# Patient Record
Sex: Male | Born: 1950 | Race: White | Hispanic: No | Marital: Married | Smoking: Former smoker
Health system: Southern US, Community
[De-identification: ages and names within clinical notes are randomized; demographics above are authoritative.]

## PROBLEM LIST (undated history)

## (undated) DIAGNOSIS — C4492 Squamous cell carcinoma of skin, unspecified: Secondary | ICD-10-CM

## (undated) DIAGNOSIS — C801 Malignant (primary) neoplasm, unspecified: Secondary | ICD-10-CM

## (undated) DIAGNOSIS — F419 Anxiety disorder, unspecified: Secondary | ICD-10-CM

## (undated) DIAGNOSIS — E119 Type 2 diabetes mellitus without complications: Secondary | ICD-10-CM

## (undated) DIAGNOSIS — D132 Benign neoplasm of duodenum: Secondary | ICD-10-CM

## (undated) DIAGNOSIS — E785 Hyperlipidemia, unspecified: Secondary | ICD-10-CM

## (undated) DIAGNOSIS — I10 Essential (primary) hypertension: Secondary | ICD-10-CM

## (undated) DIAGNOSIS — F32A Depression, unspecified: Secondary | ICD-10-CM

## (undated) DIAGNOSIS — C4491 Basal cell carcinoma of skin, unspecified: Secondary | ICD-10-CM

## (undated) DIAGNOSIS — D151 Benign neoplasm of heart: Secondary | ICD-10-CM

## (undated) DIAGNOSIS — E1140 Type 2 diabetes mellitus with diabetic neuropathy, unspecified: Secondary | ICD-10-CM

## (undated) DIAGNOSIS — G709 Myoneural disorder, unspecified: Secondary | ICD-10-CM

## (undated) DIAGNOSIS — N3010 Interstitial cystitis (chronic) without hematuria: Secondary | ICD-10-CM

## (undated) DIAGNOSIS — K869 Disease of pancreas, unspecified: Secondary | ICD-10-CM

## (undated) DIAGNOSIS — H269 Unspecified cataract: Secondary | ICD-10-CM

## (undated) HISTORY — PX: HERNIA REPAIR: SHX51

## (undated) HISTORY — PX: SPINE SURGERY: SHX786

## (undated) HISTORY — DX: Interstitial cystitis (chronic) without hematuria: N30.10

## (undated) HISTORY — PX: TURP VAPORIZATION: SUR1397

## (undated) HISTORY — DX: Basal cell carcinoma of skin, unspecified: C44.91

## (undated) HISTORY — DX: Anxiety disorder, unspecified: F41.9

## (undated) HISTORY — DX: Type 2 diabetes mellitus with diabetic neuropathy, unspecified: E11.40

## (undated) HISTORY — DX: Malignant (primary) neoplasm, unspecified: C80.1

## (undated) HISTORY — DX: Unspecified cataract: H26.9

## (undated) HISTORY — PX: NECK SURGERY: SHX720

## (undated) HISTORY — DX: Squamous cell carcinoma of skin, unspecified: C44.92

## (undated) HISTORY — DX: Depression, unspecified: F32.A

## (undated) HISTORY — DX: Type 2 diabetes mellitus without complications: E11.9

## (undated) HISTORY — DX: Essential (primary) hypertension: I10

## (undated) HISTORY — DX: Myoneural disorder, unspecified: G70.9

## (undated) HISTORY — DX: Hyperlipidemia, unspecified: E78.5

## (undated) HISTORY — PX: TRANSURETHRAL RESECTION OF PROSTATE: SHX73

## (undated) HISTORY — PX: CATARACT EXTRACTION, BILATERAL: SHX1313

---

## 2004-04-14 ENCOUNTER — Encounter: Admission: RE | Admit: 2004-04-14 | Discharge: 2004-04-14 | Payer: Self-pay | Admitting: Orthopedic Surgery

## 2004-06-15 ENCOUNTER — Inpatient Hospital Stay (HOSPITAL_COMMUNITY): Admission: RE | Admit: 2004-06-15 | Discharge: 2004-06-18 | Payer: Self-pay | Admitting: Orthopedic Surgery

## 2008-10-17 LAB — BASIC METABOLIC PANEL: Glucose: 137 mg/dL

## 2008-10-17 LAB — LIPID PANEL
Cholesterol: 226 mg/dL — AB (ref 0–200)
HDL: 35 mg/dL (ref 35–70)

## 2009-01-09 LAB — HEMOGLOBIN A1C: Hgb A1c MFr Bld: 6.2 % — AB (ref 4.0–6.0)

## 2009-01-09 LAB — LIPID PANEL
Cholesterol: 153 mg/dL (ref 0–200)
LDL Cholesterol: 94 mg/dL

## 2010-06-10 LAB — LIPID PANEL
Cholesterol: 175 mg/dL (ref 0–200)
HDL: 37 mg/dL (ref 35–70)

## 2010-06-10 LAB — BASIC METABOLIC PANEL: Glucose: 388 mg/dL

## 2010-09-23 LAB — HEMOGLOBIN A1C: Hgb A1c MFr Bld: 5.7 % (ref 4.0–6.0)

## 2010-09-23 LAB — BASIC METABOLIC PANEL: Glucose: 147 mg/dL

## 2011-06-01 ENCOUNTER — Encounter: Payer: Self-pay | Admitting: Family Medicine

## 2011-06-02 ENCOUNTER — Ambulatory Visit: Payer: BC Managed Care – PPO | Admitting: Family Medicine

## 2011-06-02 ENCOUNTER — Encounter: Payer: Self-pay | Admitting: Family Medicine

## 2011-06-02 DIAGNOSIS — E119 Type 2 diabetes mellitus without complications: Secondary | ICD-10-CM | POA: Insufficient documentation

## 2011-06-02 DIAGNOSIS — I10 Essential (primary) hypertension: Secondary | ICD-10-CM

## 2011-06-02 DIAGNOSIS — H269 Unspecified cataract: Secondary | ICD-10-CM | POA: Insufficient documentation

## 2011-06-02 DIAGNOSIS — E1165 Type 2 diabetes mellitus with hyperglycemia: Secondary | ICD-10-CM | POA: Insufficient documentation

## 2011-06-02 DIAGNOSIS — Z0000 Encounter for general adult medical examination without abnormal findings: Secondary | ICD-10-CM

## 2011-06-02 DIAGNOSIS — E785 Hyperlipidemia, unspecified: Secondary | ICD-10-CM

## 2011-06-02 LAB — COMPREHENSIVE METABOLIC PANEL
ALT: 48 U/L (ref 0–53)
AST: 31 U/L (ref 0–37)
Albumin: 4.4 g/dL (ref 3.5–5.2)
Alkaline Phosphatase: 65 U/L (ref 39–117)
BUN: 22 mg/dL (ref 6–23)
CO2: 25 mEq/L (ref 19–32)
Calcium: 9.7 mg/dL (ref 8.4–10.5)
Chloride: 102 mEq/L (ref 96–112)
Creat: 1.05 mg/dL (ref 0.50–1.35)
Glucose, Bld: 154 mg/dL — ABNORMAL HIGH (ref 70–99)
Potassium: 4.4 mEq/L (ref 3.5–5.3)
Sodium: 139 mEq/L (ref 135–145)
Total Bilirubin: 0.3 mg/dL (ref 0.3–1.2)
Total Protein: 6.9 g/dL (ref 6.0–8.3)

## 2011-06-02 LAB — POCT URINALYSIS DIPSTICK
Bilirubin, UA: NEGATIVE
Glucose, UA: NEGATIVE
Ketones, UA: NEGATIVE
Leukocytes, UA: NEGATIVE
Nitrite, UA: NEGATIVE
Protein, UA: 30
Spec Grav, UA: 1.025
Urobilinogen, UA: 0.2
pH, UA: 5.5

## 2011-06-02 LAB — LIPID PANEL
Cholesterol: 173 mg/dL (ref 0–200)
HDL: 43 mg/dL (ref >39)
LDL Cholesterol: 102 mg/dL — ABNORMAL HIGH (ref 0–99)
Total CHOL/HDL Ratio: 4 Ratio
Triglycerides: 139 mg/dL (ref <150)
VLDL: 28 mg/dL (ref 0–40)

## 2011-06-02 LAB — POCT GLYCOSYLATED HEMOGLOBIN (HGB A1C): Hemoglobin A1C: 6.2

## 2011-06-02 LAB — POCT UA - MICROSCOPIC ONLY
Casts, Ur, LPF, POC: NEGATIVE
Crystals, Ur, HPF, POC: NEGATIVE
Yeast, UA: NEGATIVE

## 2011-06-02 NOTE — Progress Notes (Signed)
Andrew Tanner is here for a follow up on blood pressure meds and diabetes. He has been under great stress for two months since younger brother was diagnosed with kidney cancer.  His exercise program was thrown off and his blood sugars are running over 200.  He is a very spiritual man, and he is struggling to get his life back to a managable routine.  His wife is working with him to start a walking routine.  His daughter is in [place], [place] in a work study program, doing great things, so that is going well.  We caught up on personal things.  O:  HEENT stable.  Cataracts stable.  No neovascular changes Chest clear. Heart reg, no Murmurs Abdomen soft, no HSM or masses Skin no changes in seborrhea, feet clear, no edema, pulses normal  A:  Stable c-v;  Stress has affected dm P:  Restart Metformin 500 er qhs Refill the lisinopril 20-25 Continue statin Check labs Follow up 3 months Resume exercise Schedule colonoscopy

## 2011-08-25 ENCOUNTER — Ambulatory Visit (INDEPENDENT_AMBULATORY_CARE_PROVIDER_SITE_OTHER): Payer: BC Managed Care – PPO | Admitting: Family Medicine

## 2011-08-25 ENCOUNTER — Encounter: Payer: Self-pay | Admitting: Family Medicine

## 2011-08-25 VITALS — BP 137/83 | HR 89 | Temp 97.5°F | Resp 16 | Ht 71.0 in | Wt 219.8 lb

## 2011-08-25 DIAGNOSIS — M542 Cervicalgia: Secondary | ICD-10-CM

## 2011-08-25 DIAGNOSIS — R202 Paresthesia of skin: Secondary | ICD-10-CM

## 2011-08-25 DIAGNOSIS — R209 Unspecified disturbances of skin sensation: Secondary | ICD-10-CM

## 2011-08-25 NOTE — Progress Notes (Signed)
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  BS's are 115 typically.  He is in today for:  Left upper arm fasciculations, numbness into left dorsal middle finger and left anterior shin x 3 months. H/O C6 disc surgery posteriorly Dr. Cristela Felt  Objective: Patient in no distress whatsoever., And pleasant to talk with.  HEENT: Unremarkable  Neck: Full range of motion, supple, no adenopathy, no tenderness, midline scar prominent in the line cervical spine  Left upper extremity: No muscle wasting or significant  weakness, reflexes symmetrical in the biceps and triceps areas. No fasciculations noted.  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 The symptoms are not progressing rapidly, but in light of the past surgery I think followup MRI is indicated.  Plan: MRI of cervical spine without contrast

## 2011-08-31 ENCOUNTER — Ambulatory Visit
Admission: RE | Admit: 2011-08-31 | Discharge: 2011-08-31 | Disposition: A | Discharge status: Home / Self Care | Payer: BC Managed Care – PPO | Source: Ambulatory Visit | Attending: Family Medicine | Admitting: Family Medicine

## 2011-08-31 DIAGNOSIS — R202 Paresthesia of skin: Secondary | ICD-10-CM

## 2011-09-15 ENCOUNTER — Other Ambulatory Visit: Payer: Self-pay | Admitting: Family Medicine

## 2011-09-15 ENCOUNTER — Telehealth: Payer: Self-pay

## 2011-09-15 DIAGNOSIS — M5090 Cervical disc disorder, unspecified, unspecified cervical region: Secondary | ICD-10-CM

## 2011-09-15 DIAGNOSIS — M542 Cervicalgia: Secondary | ICD-10-CM

## 2011-09-15 NOTE — Telephone Encounter (Signed)
Dr. Elbert Ewings,  I do not see a neuro referral-- can you please order this on patient?

## 2011-09-15 NOTE — Telephone Encounter (Signed)
Dr Kinnie Scales stopped by [phone] to speak w/you.  He has had a MRI and is waiting on a referral to a neurologist.  He states that he has called here and left a messagae, but has not gotten any response.  You may contact him @ [phone]

## 2011-10-10 ENCOUNTER — Telehealth: Payer: Self-pay

## 2011-10-10 NOTE — Telephone Encounter (Signed)
LMOM for pt to CB to let us know if he has been able to see the specialist and give [place] status report for Dr L. (Dr L had asked me to check w/pt).

## 2011-10-12 ENCOUNTER — Telehealth: Payer: Self-pay

## 2011-10-12 NOTE — Telephone Encounter (Signed)
LMOM again for pt stating we were just checking on him for Dr L and asked him to [facility] if he has any ?s/concerns for Dr L and/or if he is having trouble getting in to see the specialist.

## 2011-10-12 NOTE — Telephone Encounter (Signed)
Dr. Milus Glazier  [place] was [place] of [place] country [place] today.  He can be reached at [phone]

## 2011-10-12 NOTE — Telephone Encounter (Signed)
Pt did see Dr. Danielle Dess before he left out the country and Dr. Danielle Dess is supposed to be faxing the report to [place]. Dr. Danielle Dess is talking about replacing some discs in his neck. Verbally gave Dr. Milus Glazier the update.

## 2012-01-09 ENCOUNTER — Encounter: Payer: Self-pay | Admitting: Family Medicine

## 2012-01-09 ENCOUNTER — Ambulatory Visit: Payer: BC Managed Care – PPO | Admitting: Family Medicine

## 2012-01-09 VITALS — BP 120/76 | HR 70 | Temp 98.2°F | Resp 16 | Ht 71.0 in | Wt 221.2 lb

## 2012-01-09 DIAGNOSIS — R1031 Right lower quadrant pain: Secondary | ICD-10-CM

## 2012-01-09 DIAGNOSIS — E119 Type 2 diabetes mellitus without complications: Secondary | ICD-10-CM

## 2012-01-09 DIAGNOSIS — I10 Essential (primary) hypertension: Secondary | ICD-10-CM

## 2012-01-09 LAB — POCT CBC
Granulocyte percent: 80.8 %G — AB (ref 37–80)
Hemoglobin: 15.9 g/dL (ref 14.1–18.1)
MCH, POC: 29.9 pg (ref 27–31.2)
MID (cbc): 0.5 (ref 0–0.9)
MPV: 9.5 fL (ref 0–99.8)
POC Granulocyte: 6.1 (ref 2–6.9)
POC MID %: 6.3 %M (ref 0–12)
Platelet Count, POC: 236 10*3/uL (ref 142–424)
RBC: 5.31 M/uL (ref 4.69–6.13)
WBC: 7.6 10*3/uL (ref 4.6–10.2)

## 2012-01-09 LAB — POCT URINALYSIS DIPSTICK
Bilirubin, UA: NEGATIVE
Blood, UA: NEGATIVE
Glucose, UA: 250
Spec Grav, UA: >=1.03
pH, UA: 5.5

## 2012-01-09 LAB — COMPREHENSIVE METABOLIC PANEL
ALT: 29 U/L (ref 0–53)
AST: 19 U/L (ref 0–37)
Calcium: 10.3 mg/dL (ref 8.4–10.5)
Chloride: 102 mEq/L (ref 96–112)
Creat: 1.08 mg/dL (ref 0.50–1.35)
Sodium: 138 mEq/L (ref 135–145)
Total Bilirubin: 0.3 mg/dL (ref 0.3–1.2)
Total Protein: 6.5 g/dL (ref 6.0–8.3)

## 2012-01-09 MED ORDER — OMEPRAZOLE 40 MG PO CPDR: 40.0000 mg | DELAYED_RELEASE_CAPSULE | Freq: Every day | ORAL | Status: DC

## 2012-01-09 MED ORDER — IBUPROFEN 800 MG PO TABS: 800.0000 mg | ORAL_TABLET | Freq: Three times a day (TID) | ORAL | Status: DC | PRN

## 2012-01-09 NOTE — Progress Notes (Signed)
Subjective:    Patient ID: Andrew Tanner, male    DOB: 01/27/1951, 61 y.o.   MRN: [id number] Chief Complaint  Patient presents with  . Abdominal Pain    pt c/o umbilical pain close to navel x 4 months on and off scheduled for neck surgery nov 22    HPI  Has been having RLQ abd pain - unknown if it is coming from abd muscles vs internal. Normal BM, normal voiding.  No identified triggers - often wakes with from sleep.  Sometimes will last for about 3-4 days - no exacerbating or relieving factors. Tried diet change with decreased carbonated drinks and increased milk.  Not connected to food or BM at all.  Maybe a lttle worse with sitting long term or using abdominal muscles.  Did have h/o hernia - L inguinal repaired in the 80s with mesh.  Occ uses tums for reflux unchanged.  No changes in medications or supplement. No n/v. Pain feels like " a torn muscle" - aching.   No past medical history on file. Current Outpatient Prescriptions on File Prior to Visit  Medication Sig Dispense Refill  . citalopram (CELEXA) 40 MG tablet Take 40 mg by mouth daily.      . clonazePAM (KLONOPIN) 0.5 MG tablet Take 0.5 mg by mouth 3 (three) times daily as needed.      . fish oil-omega-3 fatty acids 1000 MG capsule Take 2 g by mouth daily.      Marland Kitchen lisinopril-hydrochlorothiazide (PRINZIDE,ZESTORETIC) 20-25 MG per tablet Take 1 tablet by mouth daily.      . metFORMIN (GLUCOPHAGE) 500 MG tablet Take 500 mg by mouth 2 (two) times daily with a meal.      . Multiple Vitamin (MULTIVITAMIN) tablet Take 1 tablet by mouth daily.      . niacin 500 MG tablet Take 500 mg by mouth daily.      . simvastatin (ZOCOR) 40 MG tablet Take 40 mg by mouth every evening.       No current facility-administered medications on file prior to visit.   No Known Allergies   Review of Systems  Constitutional: Positive for appetite change. Negative for fever, chills, diaphoresis and activity change.  HENT: Positive for neck pain and neck  stiffness.   Respiratory: Negative for shortness of breath.   Cardiovascular: Negative for chest pain.  Gastrointestinal: Positive for abdominal pain. Negative for nausea, vomiting, diarrhea, constipation, blood in stool, abdominal distention, anal bleeding and rectal pain.  Genitourinary: Negative for dysuria, urgency, frequency, hematuria, flank pain, decreased urine volume, discharge, difficulty urinating, penile pain and testicular pain.  Musculoskeletal: Positive for myalgias. Negative for joint swelling and gait problem.  Hematological: Negative for adenopathy.  Psychiatric/Behavioral: Positive for sleep disturbance.      BP 120/76  Pulse 70  Temp(Src) 98.2 F (36.8 C) (Oral)  Resp 16  Ht 5\' 11"  (1.803 m)  Wt 221 lb 3.2 oz (100.336 kg)  BMI 30.86 kg/m2  SpO2 97% Objective:   Physical Exam  Constitutional: He appears well-developed and well-nourished. No distress.  HENT:  Head: Normocephalic and atraumatic.  Neck: Normal range of motion. Neck supple. No thyromegaly present.  Cardiovascular: Normal rate, regular rhythm and normal heart sounds.   Pulmonary/Chest: Effort normal and breath sounds normal. No respiratory distress.  Abdominal: Soft. Normal appearance and bowel sounds are normal. He exhibits no distension and no mass. There is no tenderness. There is no rebound, no guarding and no CVA tenderness. No hernia.  Genitourinary: Rectum  normal and prostate normal. Rectal exam shows no tenderness and anal tone normal. Guaiac negative stool.  Lymphadenopathy:    He has no cervical adenopathy.  Skin: He is not diaphoretic.      Results for orders placed in visit on 01/09/12  COMPREHENSIVE METABOLIC PANEL      Result Value Range   Sodium 138  135 - 145 mEq/L   Potassium 4.3  3.5 - 5.3 mEq/L   Chloride 102  96 - 112 mEq/L   CO2 27  19 - 32 mEq/L   Glucose, Bld 223 (*) 70 - 99 mg/dL   BUN 21  6 - 23 mg/dL   Creat 4.09  8.11 - 9.14 mg/dL   Total Bilirubin 0.3  0.3 - 1.2  mg/dL   Alkaline Phosphatase 60  39 - 117 U/L   AST 19  0 - 37 U/L   ALT 29  0 - 53 U/L   Total Protein 6.5  6.0 - 8.3 g/dL   Albumin 4.3  3.5 - 5.2 g/dL   Calcium 78.2  8.4 - 95.6 mg/dL  H. PYLORI ANTIBODY, IGG      Result Value Range   H Pylori IgG <0.40    POCT CBC      Result Value Range   WBC 7.6  4.6 - 10.2 K/uL   Lymph, poc 1.0  0.6 - 3.4   POC LYMPH PERCENT 12.9  10 - 50 %L   MID (cbc) 0.5  0 - 0.9   POC MID % 6.3  0 - 12 %M   POC Granulocyte 6.1  2 - 6.9   Granulocyte percent 80.8 (*) 37 - 80 %G   RBC 5.31  4.69 - 6.13 M/uL   Hemoglobin 15.9  14.1 - 18.1 g/dL   HCT, POC 21.3  08.6 - 53.7 %   MCV 93.6  80 - 97 fL   MCH, POC 29.9  27 - 31.2 pg   MCHC 32.0  31.8 - 35.4 g/dL   RDW, POC 57.8     Platelet Count, POC 236  142 - 424 K/uL   MPV 9.5  0 - 99.8 fL  POCT URINALYSIS DIPSTICK      Result Value Range   Color, UA yellow     Clarity, UA clear     Glucose, UA 250     Bilirubin, UA negative     Ketones, UA negative     Spec Grav, UA >=1.030     Blood, UA negative     pH, UA 5.5     Protein, UA negative     Urobilinogen, UA 0.2     Nitrite, UA negative     Leukocytes, UA Negative    POCT GLYCOSYLATED HEMOGLOBIN (HGB A1C)      Result Value Range   Hemoglobin A1C 6.7      Assessment & Plan:  Diabetes mellitus type II - Plan: POCT glycosylated hemoglobin (Hb A1C) - well controlled on metformin.  Hypertension  Abdominal pain, right lower quadrant - Plan: POCT CBC, Comprehensive metabolic panel, H. pylori antibody, IgG, POCT urinalysis dipstick, IFOBT POC (occult bld, rslt in office), IFOBT POC (occult bld, rslt in office) - unknown etiology - will treat with high dose nsaid for muscular type pain and cover for gastritis as this may irritate it.  Meds ordered this encounter  Medications  . ibuprofen (ADVIL,MOTRIN) 800 MG tablet    Sig: Take 1 tablet (800 mg total) by mouth every 8 (eight)  hours as needed for pain.    Dispense:  30 tablet    Refill:  0  .  omeprazole (PRILOSEC) 40 MG capsule    Sig: Take 1 capsule (40 mg total) by mouth daily.    Dispense:  30 capsule    Refill:  0

## 2012-02-24 ENCOUNTER — Ambulatory Visit: Payer: BC Managed Care – PPO | Admitting: Family Medicine

## 2012-10-18 ENCOUNTER — Telehealth: Payer: Self-pay

## 2012-10-18 MED ORDER — LISINOPRIL-HYDROCHLOROTHIAZIDE 20-25 MG PO TABS: 1.0000 | ORAL_TABLET | Freq: Every day | ORAL | Status: DC

## 2012-10-18 MED ORDER — METFORMIN HCL 500 MG PO TABS: 500.0000 mg | ORAL_TABLET | Freq: Two times a day (BID) | ORAL | Status: DC

## 2012-10-18 MED ORDER — SIMVASTATIN 40 MG PO TABS: 40.0000 mg | ORAL_TABLET | Freq: Every evening | ORAL | Status: DC

## 2012-10-18 NOTE — Telephone Encounter (Signed)
Pt called today and scheduled the first available appt with Dr. Elbert Ewings for August 14. He is about out of his meds and would like refills to carry him til this appt.  Metformin Lisinopril Simvastatin  [facility] on [address]  Pt [phone]

## 2012-11-22 ENCOUNTER — Ambulatory Visit (INDEPENDENT_AMBULATORY_CARE_PROVIDER_SITE_OTHER): Payer: BC Managed Care – PPO | Admitting: Family Medicine

## 2012-11-22 ENCOUNTER — Encounter: Payer: Self-pay | Admitting: Family Medicine

## 2012-11-22 VITALS — BP 136/94 | HR 94 | Temp 98.3°F | Resp 16 | Ht 71.5 in | Wt 210.0 lb

## 2012-11-22 DIAGNOSIS — E119 Type 2 diabetes mellitus without complications: Secondary | ICD-10-CM

## 2012-11-22 DIAGNOSIS — H539 Unspecified visual disturbance: Secondary | ICD-10-CM

## 2012-11-22 DIAGNOSIS — Z23 Encounter for immunization: Secondary | ICD-10-CM

## 2012-11-22 MED ORDER — LISINOPRIL-HYDROCHLOROTHIAZIDE 20-25 MG PO TABS: 1.0000 | ORAL_TABLET | Freq: Every day | ORAL | Status: DC

## 2012-11-22 MED ORDER — METFORMIN HCL 500 MG PO TABS: 500.0000 mg | ORAL_TABLET | Freq: Two times a day (BID) | ORAL | Status: DC

## 2012-11-22 MED ORDER — SIMVASTATIN 40 MG PO TABS: 40.0000 mg | ORAL_TABLET | Freq: Every evening | ORAL | Status: DC

## 2012-11-22 NOTE — Progress Notes (Signed)
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 He recently took a trip to [place] and friends which was a great prep. Had some changes in his diet while traveling which were reflected by elevations in blood sugar up to 160. Andrew Tanner's under 150 though. Would like to defer the A1c test until he is back on his usual routine.  He had neck surgery which was very successful by Dr. Barnett Abu. He still recovering the pain is gone.  He does fine work and has noticed that he's having to use a variety of lenses to really get the kind of quality and his work that he needs. Like to see someone about improving his vision.  He's overdue for a tetanus which she received today.  Objective: No acute distress Fundi appear normal, there is a small cataract in each eye. Chest: Clear Heart: Regular no murmur Abdomen: Soft nontender without HSM Extremities: Good pulses and no edema  Assessment: Really doing quite well although the patient has change. We decided to do for the hemoglobin A1c until next visit in 3 months.  Plan:Need for Tdap vaccination - Plan: Tdap vaccine greater than or equal to 7yo IM  Visual changes - Plan: Ambulatory referral to Ophthalmology  Signed, Elvina Sidle, MD

## 2013-02-21 ENCOUNTER — Ambulatory Visit (INDEPENDENT_AMBULATORY_CARE_PROVIDER_SITE_OTHER): Payer: BC Managed Care – PPO | Admitting: Family Medicine

## 2013-02-21 ENCOUNTER — Encounter: Payer: Self-pay | Admitting: Family Medicine

## 2013-02-21 VITALS — BP 130/88 | HR 83 | Temp 98.3°F | Resp 16 | Ht 72.0 in | Wt 211.6 lb

## 2013-02-21 DIAGNOSIS — I10 Essential (primary) hypertension: Secondary | ICD-10-CM

## 2013-02-21 DIAGNOSIS — E119 Type 2 diabetes mellitus without complications: Secondary | ICD-10-CM

## 2013-02-21 DIAGNOSIS — Z23 Encounter for immunization: Secondary | ICD-10-CM

## 2013-02-21 DIAGNOSIS — E785 Hyperlipidemia, unspecified: Secondary | ICD-10-CM

## 2013-02-21 LAB — LIPID PANEL
Cholesterol: 295 mg/dL — ABNORMAL HIGH (ref 0–200)
HDL: 38 mg/dL — ABNORMAL LOW (ref >39)
LDL Cholesterol: 195 mg/dL — ABNORMAL HIGH (ref 0–99)
Total CHOL/HDL Ratio: 7.8 Ratio
Triglycerides: 312 mg/dL — ABNORMAL HIGH (ref <150)
VLDL: 62 mg/dL — ABNORMAL HIGH (ref 0–40)

## 2013-02-21 LAB — COMPREHENSIVE METABOLIC PANEL
ALT: 33 U/L (ref 0–53)
AST: 23 U/L (ref 0–37)
Albumin: 4.1 g/dL (ref 3.5–5.2)
Alkaline Phosphatase: 84 U/L (ref 39–117)
BUN: 16 mg/dL (ref 6–23)
CO2: 23 mEq/L (ref 19–32)
Calcium: 9.6 mg/dL (ref 8.4–10.5)
Chloride: 101 mEq/L (ref 96–112)
Creat: 0.94 mg/dL (ref 0.50–1.35)
Glucose, Bld: 210 mg/dL — ABNORMAL HIGH (ref 70–99)
Potassium: 4.2 mEq/L (ref 3.5–5.3)
Sodium: 134 mEq/L — ABNORMAL LOW (ref 135–145)
Total Bilirubin: 0.4 mg/dL (ref 0.3–1.2)
Total Protein: 6.6 g/dL (ref 6.0–8.3)

## 2013-02-21 LAB — POCT GLYCOSYLATED HEMOGLOBIN (HGB A1C): Hemoglobin A1C: 7.9

## 2013-02-21 MED ORDER — METFORMIN HCL 500 MG PO TABS: 500.0000 mg | ORAL_TABLET | Freq: Two times a day (BID) | ORAL | Status: DC

## 2013-02-21 MED ORDER — SIMVASTATIN 40 MG PO TABS: 40.0000 mg | ORAL_TABLET | Freq: Every evening | ORAL | Status: DC

## 2013-02-21 MED ORDER — LISINOPRIL-HYDROCHLOROTHIAZIDE 20-25 MG PO TABS: 1.0000 | ORAL_TABLET | Freq: Every day | ORAL | Status: DC

## 2013-02-21 NOTE — Progress Notes (Signed)
Patient ID: Andrew Tanner MRN: [id number], DOB: July 04, 1950, 62 y.o. Date of Encounter: 02/21/2013, 8:48 AM  Primary Physician: Andrew Sidle, MD  Chief Complaint: Diabetes follow up  HPI: 63 y.o. year old male with history below presents for follow up of diabetes mellitus. Doing well. No issues or complaints. Taking medications daily without adverse effects. No polydipsia, polyphagia, polyuria, or nocturia.  Blood sugars at home: generally doesn't check it, but they have been better Diet consists of: high quality foods Exercising regularly. walking  Eye MD: dr. Walker Kehr surgery sched for Nov 22 Influenza vaccine:  today  Daughter Andrew Tanner had OCD relapse but is better now.  This initially caused a family crisis. [name] is now on low dose abilify in addition to the Celexa.  Neck is doing reasonably well (about 2/10 re: pain and paresthesias)  Past Medical History  Diagnosis Date  . Diabetes mellitus without complication   . Cataract   . Hyperlipidemia   . Hypertension      Home Meds: Prior to Admission medications   Medication Sig Start Date End Date Taking? Authorizing Provider  citalopram (CELEXA) 40 MG tablet Take 40 mg by mouth daily.   Yes Historical Provider, MD  clonazePAM (KLONOPIN) 0.5 MG tablet Take 0.5 mg by mouth 3 (three) times daily as needed.   Yes Historical Provider, MD  ibuprofen (ADVIL,MOTRIN) 800 MG tablet Take 1 tablet (800 mg total) by mouth every 8 (eight) hours as needed for pain. 01/09/12  Yes Sherren Mocha, MD  lisinopril-hydrochlorothiazide (PRINZIDE,ZESTORETIC) 20-25 MG per tablet Take 1 tablet by mouth daily. 02/21/13  Yes Andrew Sidle, MD  metFORMIN (GLUCOPHAGE) 500 MG tablet Take 1 tablet (500 mg total) by mouth 2 (two) times daily with a meal. 02/21/13  Yes Andrew Sidle, MD  simvastatin (ZOCOR) 40 MG tablet Take 1 tablet (40 mg total) by mouth every evening. 02/21/13  Yes Andrew Sidle, MD  fish oil-omega-3 fatty acids 1000 MG  capsule Take 2 g by mouth daily.    Historical Provider, MD  Multiple Vitamin (MULTIVITAMIN) tablet Take 1 tablet by mouth daily.    Historical Provider, MD  niacin 500 MG tablet Take 500 mg by mouth daily.    Historical Provider, MD  omeprazole (PRILOSEC) 40 MG capsule Take 1 capsule (40 mg total) by mouth daily. 01/09/12   Sherren Mocha, MD    Allergies: No Known Allergies  History   Social History  . Marital Status: Married    Spouse Name: N/A    Number of Children: N/A  . Years of Education: N/A   Occupational History  . Not on file.   Social History Main Topics  . Smoking status: Former Games developer  . Smokeless tobacco: Not on file     Comment: quit 28 yrs ago  . Alcohol Use: Yes     Comment: 1 glass wine per week  . Drug Use: No  . Sexual Activity: Not on file   Other Topics Concern  . Not on file   Social History Narrative  . No narrative on file     Review of Systems: Constitutional: negative for chills, fever, night sweats, weight changes, or fatigue  HEENT: negative for vision changes, hearing loss, congestion, rhinorrhea, or epistaxis Cardiovascular: negative for chest pain, palpitations, diaphoresis, DOE, orthopnea, or edema Respiratory: negative for hemoptysis, wheezing, shortness of breath, dyspnea, or cough Abdominal: negative for abdominal pain, nausea, vomiting, diarrhea, or constipation Dermatological: negative for rash, erythema, or wounds Neurologic: negative for headache, dizziness, or  syncope Renal:  Negative for polyuria, polydipsia, or dysuria All other systems reviewed and are otherwise negative with the exception to those above and in the HPI.   Physical Exam: Blood pressure 130/88, pulse 83, temperature 98.3 F (36.8 C), temperature source Oral, resp. rate 16, height 6' (1.829 m), weight 211 lb 9.6 oz (95.981 kg), SpO2 98.00%., Body mass index is 28.69 kg/(m^2). General: Well developed, well nourished, in no acute distress. Head: Normocephalic,  atraumatic, eyes without discharge, sclera non-icteric, nares are without discharge. Bilateral auditory canals clear, TM's are without perforation, pearly grey and translucent with reflective cone of light bilaterally. Oral cavity moist, posterior pharynx without exudate, erythema, peritonsillar abscess, or post nasal drip.  Neck: Supple. No thyromegaly. Full ROM. No lymphadenopathy. Lungs: Clear bilaterally to auscultation without wheezes, rales, or rhonchi. Breathing is unlabored. Heart: RRR with S1 S2. No murmurs, rubs, or gallops appreciated. Abdomen: Soft, non-tender, non-distended with normoactive bowel sounds. No hepatosplenomegaly. No rebound/guarding. No obvious abdominal masses.  Diastasis recti Msk:  Strength and tone normal for age. Extremities/Skin: Warm and dry. No clubbing or cyanosis. No edema. No rashes, wounds, or suspicious lesions. Monofilament exam negative.  Neuro: Alert and oriented X 3. Moves all extremities spontaneously. Gait is normal. CNII-XII grossly in tact. Psych:  Responds to questions appropriately with a normal affect.   Labs:   ASSESSMENT AND PLAN:  62 y.o. year old male with hypertension and diabetes: Need for prophylactic vaccination and inoculation against influenza - Plan: Flu Vaccine QUAD 36+ mos IM  Type II or unspecified type diabetes mellitus without mention of complication, not stated as uncontrolled - Plan: HM Diabetes Foot Exam, metFORMIN (GLUCOPHAGE) 500 MG tablet, Comprehensive metabolic panel, POCT glycosylated hemoglobin (Hb A1C)  Hyperlipidemia - Plan: simvastatin (ZOCOR) 40 MG tablet, Lipid panel  Hypertension - Plan: lisinopril-hydrochlorothiazide (PRINZIDE,ZESTORETIC) 20-25 MG per tablet  Neck pain stable -  Signed, Andrew Sidle, MD 02/21/2013 8:48 AM

## 2013-12-10 ENCOUNTER — Encounter: Payer: Self-pay | Admitting: Family Medicine

## 2014-03-10 ENCOUNTER — Ambulatory Visit (INDEPENDENT_AMBULATORY_CARE_PROVIDER_SITE_OTHER): Payer: BC Managed Care – PPO | Admitting: Family Medicine

## 2014-03-10 ENCOUNTER — Encounter: Payer: Self-pay | Admitting: Family Medicine

## 2014-03-10 VITALS — BP 130/86 | HR 89 | Temp 97.9°F | Resp 16 | Ht 72.0 in | Wt 197.0 lb

## 2014-03-10 DIAGNOSIS — E785 Hyperlipidemia, unspecified: Secondary | ICD-10-CM

## 2014-03-10 DIAGNOSIS — E119 Type 2 diabetes mellitus without complications: Secondary | ICD-10-CM

## 2014-03-10 DIAGNOSIS — Z23 Encounter for immunization: Secondary | ICD-10-CM

## 2014-03-10 LAB — HEMOGLOBIN A1C
Hgb A1c MFr Bld: 11.8 % — ABNORMAL HIGH (ref <5.7)
Mean Plasma Glucose: 292 mg/dL — ABNORMAL HIGH (ref <117)

## 2014-03-10 LAB — COMPLETE METABOLIC PANEL WITH GFR
ALT: 21 U/L (ref 0–53)
AST: 14 U/L (ref 0–37)
Albumin: 4.3 g/dL (ref 3.5–5.2)
Alkaline Phosphatase: 82 U/L (ref 39–117)
BUN: 17 mg/dL (ref 6–23)
CO2: 23 mEq/L (ref 19–32)
Calcium: 9.4 mg/dL (ref 8.4–10.5)
Chloride: 100 mEq/L (ref 96–112)
Creat: 0.88 mg/dL (ref 0.50–1.35)
GFR, Est African American: >89 mL/min
GFR, Est Non African American: >89 mL/min
Glucose, Bld: 380 mg/dL — ABNORMAL HIGH (ref 70–99)
Potassium: 4.3 mEq/L (ref 3.5–5.3)
Sodium: 135 mEq/L (ref 135–145)
Total Bilirubin: 0.5 mg/dL (ref 0.2–1.2)
Total Protein: 6.5 g/dL (ref 6.0–8.3)

## 2014-03-10 LAB — MICROALBUMIN, URINE: Microalb, Ur: 3.8 mg/dL — ABNORMAL HIGH (ref <2.0)

## 2014-03-10 LAB — LIPID PANEL
Cholesterol: 284 mg/dL — ABNORMAL HIGH (ref 0–200)
HDL: 35 mg/dL — ABNORMAL LOW (ref >39)
LDL Cholesterol: 180 mg/dL — ABNORMAL HIGH (ref 0–99)
Total CHOL/HDL Ratio: 8.1 Ratio
Triglycerides: 347 mg/dL — ABNORMAL HIGH (ref <150)
VLDL: 69 mg/dL — ABNORMAL HIGH (ref 0–40)

## 2014-03-10 LAB — POCT GLYCOSYLATED HEMOGLOBIN (HGB A1C): Hemoglobin A1C: 12.5

## 2014-03-10 NOTE — Progress Notes (Signed)
This chart was scribed for Robyn Haber, MD by Cathie Hoops, ED Scribe. The patient was seen in Room 27. The patient's care was started at 9:37 AM.   Patient ID: Andrew Tanner MRN: [id number], DOB: [birth year], 63 y.o. Date of Encounter: 03/10/2014, 9:37 AM  Primary Physician: Robyn Haber, MD  Chief Complaint: HTN follow-up/Diabetes follow-up  HPI: 63 y.o. year old male with history below presents for hypertension follow up and diabetes mellitus. Doing well. No issues or complaints. Taking medications daily without adverse effects. No polydipsia, polyphagia, polyuria, or nocturia. No CP, HA, visual changes, SOB or focal deficits.   Blood sugars at home: He notes he doesn't check his blood sugars regularly. He denies having any symptomatic lows.  Diet consists of: Eating 2 meals and a snack, portion control and he eats certain foods in moderation. He denies having a regular exercise routine. Last A1C:  7.9 on 02/21/2013  Eye MD: Dr. Katy Fitch, he had a eye scan one week ago with no sign of sugar in the eyes. Cataracts were removed bilaterally. His vision has improved. DDS: Appointment scheduled. Influenza vaccine: UTD Pneumococcal vaccine:  Zostavax: Denies having the shingles vaccine. Colonoscopy: Will have his colonoscopy in several months  Daughter Judson Roch works for an agency in [place] but is currently looking for new jobs in [place]. Pt notes he signed up for Medicare.    Past Medical History  Diagnosis Date  . Diabetes mellitus without complication   . Cataract   . Hyperlipidemia   . Hypertension      Home Meds: Prior to Admission medications   Medication Sig Start Date End Date Taking? Authorizing Provider  citalopram (CELEXA) 40 MG tablet Take 40 mg by mouth daily.   Yes Historical Provider, MD  clonazePAM (KLONOPIN) 0.5 MG tablet Take 0.5 mg by mouth 3 (three) times daily as needed.   Yes Historical Provider, MD  fish oil-omega-3 fatty acids 1000 MG  capsule Take 2 g by mouth daily.   Yes Historical Provider, MD  ibuprofen (ADVIL,MOTRIN) 800 MG tablet Take 1 tablet (800 mg total) by mouth every 8 (eight) hours as needed for pain. 01/09/12  Yes Shawnee Knapp, MD  lisinopril-hydrochlorothiazide (PRINZIDE,ZESTORETIC) 20-25 MG per tablet Take 1 tablet by mouth daily. 02/21/13  Yes Robyn Haber, MD  metFORMIN (GLUCOPHAGE) 500 MG tablet Take 1 tablet (500 mg total) by mouth 2 (two) times daily with a meal. 02/21/13  Yes Robyn Haber, MD  Multiple Vitamin (MULTIVITAMIN) tablet Take 1 tablet by mouth daily.   Yes Historical Provider, MD  niacin 500 MG tablet Take 500 mg by mouth daily.   Yes Historical Provider, MD  omeprazole (PRILOSEC) 40 MG capsule Take 1 capsule (40 mg total) by mouth daily. 01/09/12  Yes Shawnee Knapp, MD  simvastatin (ZOCOR) 40 MG tablet Take 1 tablet (40 mg total) by mouth every evening. 02/21/13  Yes Robyn Haber, MD    Allergies: No Known Allergies  History   Social History  . Marital Status: Married    Spouse Name: N/A    Number of Children: N/A  . Years of Education: N/A   Occupational History  . Not on file.   Social History Main Topics  . Smoking status: Former Research scientist (life sciences)  . Smokeless tobacco: Not on file     Comment: quit 28 yrs ago  . Alcohol Use: Yes     Comment: 1 glass wine per week  . Drug Use: No  . Sexual Activity: Not on file  Other Topics Concern  . Not on file   Social History Narrative     Family History  Problem Relation Age of Onset  . Heart disease Father   . Hyperlipidemia Father   . Diabetes Father     type 1  . Cancer Brother     kidney cancer  . Diabetes Brother   . Depression Brother   . Cancer Mother     lung cancer    Review of Systems: Constitutional: negative for chills, fever, night sweats, weight changes, or fatigue  HEENT: negative for vision changes, hearing loss, congestion, rhinorrhea, ST, epistaxis, or sinus pressure Cardiovascular: negative for chest pain,  palpitations, or DOE Respiratory: negative for hemoptysis, wheezing, shortness of breath, or cough Abdominal: negative for abdominal pain, nausea, vomiting, diarrhea, or constipation Dermatological: negative for rash Neurologic: negative for headache, dizziness, or syncope All other systems reviewed and are otherwise negative with the exception to those above and in the HPI.   Physical Exam: Blood pressure 130/86, pulse 89, temperature 97.9 F (36.6 C), temperature source Oral, resp. rate 16, height 6' (1.829 m), weight 197 lb (89.359 kg), SpO2 98 %., Body mass index is 26.71 kg/(m^2). BP Readings from Last 3 Encounters:  03/10/14 130/86  02/21/13 130/88  11/22/12 136/94   Wt Readings from Last 3 Encounters:  03/10/14 197 lb (89.359 kg)  02/21/13 211 lb 9.6 oz (95.981 kg)  11/22/12 210 lb (95.255 kg)   General: Well developed, well nourished, in no acute distress. Head: Normocephalic, atraumatic, eyes without discharge, sclera non-icteric, nares are without discharge. Bilateral auditory canals clear, TM's are without perforation, pearly grey and translucent with reflective cone of light bilaterally. Oral cavity moist, posterior pharynx without exudate, erythema, peritonsillar abscess, or post nasal drip.  Neck: Supple. No thyromegaly. Full ROM. No lymphadenopathy. No carotid bruits. Lungs: Clear bilaterally to auscultation without wheezes, rales, or rhonchi. Breathing is unlabored. Heart: RRR with S1 S2. No murmurs, rubs, or gallops appreciated.  Abdomen: Soft, non-tender, non-distended with normoactive bowel sounds. No hepatosplenomegaly. No rebound/guarding. No obvious abdominal masses. Msk:  Strength and tone normal for age. Extremities/Skin: Warm and dry. No clubbing or cyanosis. No edema. No rashes or suspicious lesions. Distal pulses 2+ and equal bilaterally.   He also has a new right inner calf lesion which is enlarging.  It is crusting and verrucous, measuring 5 mm.  He has  diffuse seborrhea on face and scalp. Neuro: Alert and oriented X 3. Moves all extremities spontaneously. Gait is normal. CNII-XII grossly in tact. DTR 2+, cerebellar function intact. Rhomberg normal. Psych:  Responds to questions appropriately with a normal affect.   Labs: Results for orders placed or performed in visit on 03/10/14  POCT glycosylated hemoglobin (Hb A1C)  Result Value Ref Range   Hemoglobin A1C 12.5   I am repeating the A1C as this value is so abnormal.  ASSESSMENT AND PLAN:  63 y.o. year old male with  Type 2 diabetes mellitus without complication - Plan: HM Diabetes Foot Exam, POCT glycosylated hemoglobin (Hb A1C), Microalbumin, urine, COMPLETE METABOLIC PANEL WITH GFR, Hemoglobin A1c, CANCELED: POCT glycosylated hemoglobin (Hb A1C)  Hyperlipidemia - Plan: POCT glycosylated hemoglobin (Hb A1C), Lipid panel, COMPLETE METABOLIC PANEL WITH GFR  Scheduled skin biopsy for tomorrow at 9 am.  Signed, Robyn Haber, MD 03/10/2014 9:37 AM

## 2014-03-11 ENCOUNTER — Ambulatory Visit (INDEPENDENT_AMBULATORY_CARE_PROVIDER_SITE_OTHER): Payer: BC Managed Care – PPO | Admitting: Family Medicine

## 2014-03-11 ENCOUNTER — Encounter: Payer: Self-pay | Admitting: Family Medicine

## 2014-03-11 VITALS — BP 120/78 | HR 96 | Temp 98.5°F | Resp 16 | Ht 71.0 in | Wt 196.0 lb

## 2014-03-11 DIAGNOSIS — E11630 Type 2 diabetes mellitus with periodontal disease: Secondary | ICD-10-CM

## 2014-03-11 DIAGNOSIS — E785 Hyperlipidemia, unspecified: Secondary | ICD-10-CM

## 2014-03-11 DIAGNOSIS — L989 Disorder of the skin and subcutaneous tissue, unspecified: Secondary | ICD-10-CM

## 2014-03-11 MED ORDER — CANAGLIFLOZIN-METFORMIN HCL 50-1000 MG PO TABS: 1.0000 | ORAL_TABLET | ORAL | Status: DC

## 2014-03-11 MED ORDER — ROSUVASTATIN CALCIUM 10 MG PO TABS: 10.0000 mg | ORAL_TABLET | Freq: Every day | ORAL | Status: DC

## 2014-03-11 NOTE — Progress Notes (Signed)
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  He also has type 2 diabetes which has gotten out of control  I spoke with wife Horris Latino and patient at length about getting the diabetes under control with the new medication, as well as controlling the lipids.  In addition, he needs dental attention.  All of these improvements will lead to prevention of heart and vascular disease.  I spent over 30 minutes face to face discussing the importance of getting diabetes, cholesterol, and dental care under control.  Objective:  NAD Right leg lesion of 5 mm is verrucous and elevated.  Area was prepped with betadine and anesthetized with 1% xylo with epi. An eliptical incision was used to remove the entire lesion with 3 mm margin. 4 interrupted 4-0 nylon sutures were used to close the lesion and a sterile dressing was applied.  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

## 2014-03-12 ENCOUNTER — Other Ambulatory Visit: Payer: Self-pay | Admitting: Family Medicine

## 2014-03-12 DIAGNOSIS — E11630 Type 2 diabetes mellitus with periodontal disease: Secondary | ICD-10-CM

## 2014-03-12 DIAGNOSIS — E785 Hyperlipidemia, unspecified: Secondary | ICD-10-CM

## 2014-03-12 MED ORDER — SIMVASTATIN 20 MG PO TABS: 20.0000 mg | ORAL_TABLET | Freq: Every day | ORAL | Status: DC

## 2014-03-12 MED ORDER — CANAGLIFLOZIN-METFORMIN HCL 50-1000 MG PO TABS: 1.0000 | ORAL_TABLET | ORAL | Status: DC

## 2014-03-20 ENCOUNTER — Encounter: Payer: Self-pay | Admitting: Family Medicine

## 2014-03-20 ENCOUNTER — Ambulatory Visit (INDEPENDENT_AMBULATORY_CARE_PROVIDER_SITE_OTHER): Payer: BC Managed Care – PPO | Admitting: Family Medicine

## 2014-03-20 VITALS — BP 151/93 | HR 95 | Temp 98.5°F | Resp 16 | Ht 71.5 in | Wt 196.4 lb

## 2014-03-20 DIAGNOSIS — L989 Disorder of the skin and subcutaneous tissue, unspecified: Secondary | ICD-10-CM

## 2014-03-20 NOTE — Progress Notes (Signed)
Subjective:    Patient ID: Andrew Tanner, male    DOB: 1950-05-21, 63 y.o.   MRN: [id number]  This chart was scribed for Robyn Haber, MD by Chester Holstein, ED Scribe. This patient was seen in room 26 and the patient's care was started at 2:36 PM.   HPI  HPI Comments: Andrew Tanner is a 63 y.o. male who presents to the [facility] for suture removal from his right leg.  Pt notes the area has some noticeable redness.   Pt is currently taking simvastatin.  Pt has only been taking his Invokamet for 6 days but has not noticed any changes in his blood sugar. Pt has an infected tooth in his mouth and he is planning on seeing an oral surgeon for the problem. Pt has been prescribe amoxicillin to start today.   Patient is a Corporate treasurer  Past Medical History  Diagnosis Date  . Diabetes mellitus without complication   . Cataract   . Hyperlipidemia   . Hypertension     Past Surgical History  Procedure Laterality Date  . Hernia repair    . Turp vaporization    . Spine surgery    . Transurethral resection of prostate      Family History  Problem Relation Age of Onset  . Heart disease Father   . Hyperlipidemia Father   . Diabetes Father     type 1  . Cancer Brother     kidney cancer  . Diabetes Brother   . Depression Brother   . Cancer Mother     lung cancer    History   Social History  . Marital Status: Married    Spouse Name: [name]    Number of Children: N/A  . Years of Education: N/A   Occupational History  . Not on file.   Social History Main Topics  . Smoking status: Former Research scientist (life sciences)  . Smokeless tobacco: Not on file     Comment: quit 28 yrs ago  . Alcohol Use: Yes     Comment: 1 glass wine per week  . Drug Use: No  . Sexual Activity: Not on file   Other Topics Concern  . Not on file   Social History Narrative    No Known Allergies     Review of Systems  HENT: Positive for dental problem.   Skin: Positive for  wound (from lesion removal, sutured, healing).       Objective:   Physical Exam  Constitutional: He is oriented to person, place, and time. He appears well-developed and well-nourished.  HENT:  Head: Normocephalic.  Eyes: Conjunctivae are normal.  Neck: Normal range of motion. Neck supple.  Pulmonary/Chest: Effort normal.  Musculoskeletal: Normal range of motion.  Neurological: He is alert and oriented to person, place, and time.  Skin: Skin is warm and dry.  Psychiatric: He has a normal mood and affect. His behavior is normal.  Nursing note and vitals reviewed.  Sutures removed from right medial popliteal area. The area does show some erythema. There is no purulent drainage.      BP 151/93 mmHg  Pulse 95  Temp(Src) 98.5 F (36.9 C) (Oral)  Resp 16  Ht 5' 11.5" (1.816 m)  Wt 196 lb 6.4 oz (89.086 kg)  BMI 27.01 kg/m2  SpO2 97%  Assessment & Plan:    Continue the simvastatin and invoka met   Dental work next week. Start the amoxicillin today. Call if area on leg  continues to look red or infected.  This chart was scribed in my presence and reviewed by me personally.    ICD-9-CM ICD-10-CM   1. Skin lesion of right lower extremity 709.9 L98.9      Signed, Robyn Haber, MD  Robyn Haber, M.D.

## 2014-03-21 ENCOUNTER — Other Ambulatory Visit: Payer: Self-pay | Admitting: Family Medicine

## 2014-03-21 DIAGNOSIS — L03116 Cellulitis of left lower limb: Secondary | ICD-10-CM

## 2014-03-21 MED ORDER — AMOXICILLIN-POT CLAVULANATE 875-125 MG PO TABS: 1.0000 | ORAL_TABLET | Freq: Two times a day (BID) | ORAL | Status: DC

## 2014-06-26 ENCOUNTER — Encounter: Payer: Self-pay | Admitting: Family Medicine

## 2014-06-26 ENCOUNTER — Ambulatory Visit (INDEPENDENT_AMBULATORY_CARE_PROVIDER_SITE_OTHER): Payer: Medicare Other | Admitting: Family Medicine

## 2014-06-26 VITALS — BP 134/83 | HR 84 | Temp 98.0°F | Resp 16 | Ht 71.5 in | Wt 197.8 lb

## 2014-06-26 DIAGNOSIS — E1165 Type 2 diabetes mellitus with hyperglycemia: Secondary | ICD-10-CM | POA: Diagnosis not present

## 2014-06-26 DIAGNOSIS — IMO0002 Reserved for concepts with insufficient information to code with codable children: Secondary | ICD-10-CM

## 2014-06-26 LAB — POCT GLYCOSYLATED HEMOGLOBIN (HGB A1C): Hemoglobin A1C: 6.4

## 2014-06-26 NOTE — Progress Notes (Signed)
Patient ID: Andrew Tanner, male   DOB: [birth year], 64 y.o.   MRN: [id number]  This chart was scribed for Robyn Haber, MD by Ladene Artist, ED Scribe. The patient was seen in room 25. Patient's care was started at 1:00 PM.  Patient ID: Andrew Tanner MRN: [id number], DOB: 1950/04/26, 64 y.o. Date of Encounter: 06/26/2014, 1:00 PM  Primary Physician: Robyn Haber, MD  Chief Complaint  Patient presents with  . Diabetes    3 month follow up  . Hypertension  . Hyperlipidemia    HPI: 64 y.o. year old male with history below presents for a DM follow-up.  Pt states that he feels well overall. He has been walking 10 miles per week. He has also eliminated fried foods, diet Coke and sugar from his diet. Pt denies visual disturbances and loss of sensation in his feet.  Preventative Maintenance  Pt has his eyes examined regularly. He had a prescription change in December 2015; no signs of glaucoma.  He had his infected tooth treated by the dentist and this worked out extremely well.  Pt's daughter has finished 2 months at her job as an Surveyor, minerals. Her job wants her to take classes to potentially serve as the Editor, commissioning.  Past Medical History  Diagnosis Date  . Diabetes mellitus without complication   . Cataract   . Hyperlipidemia   . Hypertension     Home Meds: Prior to Admission medications   Medication Sig Start Date End Date Taking? Authorizing Provider  amoxicillin-clavulanate (AUGMENTIN) 875-125 MG per tablet Take 1 tablet by mouth 2 (two) times daily. 03/21/14   Robyn Haber, MD  Canagliflozin-Metformin HCl (INVOKAMET) 50-1000 MG TABS Take 1 tablet by mouth 1 day or 1 dose. Please call me if the coupon I sent does not cover the once a day prescription for the next year. 03/12/14   Robyn Haber, MD  citalopram (CELEXA) 40 MG tablet Take 40 mg by mouth daily.    Historical Provider, MD  clonazePAM (KLONOPIN) 0.5 MG tablet Take 0.5 mg by mouth 3 (three) times daily  as needed.    Historical Provider, MD  fish oil-omega-3 fatty acids 1000 MG capsule Take 2 g by mouth daily.    Historical Provider, MD  ibuprofen (ADVIL,MOTRIN) 800 MG tablet Take 1 tablet (800 mg total) by mouth every 8 (eight) hours as needed for pain. 01/09/12   Shawnee Knapp, MD  lisinopril-hydrochlorothiazide (PRINZIDE,ZESTORETIC) 20-25 MG per tablet Take 1 tablet by mouth daily. 02/21/13   Robyn Haber, MD  Multiple Vitamin (MULTIVITAMIN) tablet Take 1 tablet by mouth daily.    Historical Provider, MD  omeprazole (PRILOSEC) 40 MG capsule Take 1 capsule (40 mg total) by mouth daily. 01/09/12   Shawnee Knapp, MD  rosuvastatin (CRESTOR) 10 MG tablet Take 1 tablet (10 mg total) by mouth daily. 03/11/14   Robyn Haber, MD  simvastatin (ZOCOR) 20 MG tablet Take 1 tablet (20 mg total) by mouth at bedtime. 03/12/14   Robyn Haber, MD    Allergies: No Known Allergies  History   Social History  . Marital Status: Married    Spouse Name: [name]  . Number of Children: N/A  . Years of Education: N/A   Occupational History  . Not on file.   Social History Main Topics  . Smoking status: Former Research scientist (life sciences)  . Smokeless tobacco: Not on file     Comment: quit 28 yrs ago  . Alcohol Use: Yes     Comment: 1 glass  wine per week  . Drug Use: No  . Sexual Activity: Not on file   Other Topics Concern  . Not on file   Social History Narrative    Review of Systems: Constitutional: negative for chills, fever, night sweats, weight changes, or fatigue  HEENT: negative for vision changes, hearing loss, congestion, rhinorrhea, ST, epistaxis, or sinus pressure Cardiovascular: negative for chest pain or palpitations Respiratory: negative for hemoptysis, wheezing, shortness of breath, or cough Abdominal: negative for abdominal pain, nausea, vomiting, diarrhea, or constipation Dermatological: negative for rash Neurologic: negative for headache, dizziness, or syncope All other systems reviewed and  are otherwise negative with the exception to those above and in the HPI.  Physical Exam: Blood pressure 134/83, pulse 84, temperature 98 F (36.7 C), temperature source Oral, resp. rate 16, height 5' 11.5" (1.816 m), weight 197 lb 12.8 oz (89.721 kg), SpO2 100 %., Body mass index is 27.21 kg/(m^2). General: Well developed, well nourished, in no acute distress. Head: Normocephalic, atraumatic, eyes without discharge, sclera non-icteric, nares are without discharge. Bilateral auditory canals clear, TM's are without perforation, pearly grey and translucent with reflective cone of light bilaterally. Oral cavity moist, posterior pharynx without exudate, erythema, peritonsillar abscess, or post nasal drip.  Neck: Supple. No thyromegaly. Full ROM. No lymphadenopathy. Lungs: Clear bilaterally to auscultation without wheezes, rales, or rhonchi. Breathing is unlabored. Heart: RRR with S1 S2. No murmurs, rubs, or gallops appreciated. Abdomen: Soft, non-tender, non-distended with normoactive bowel sounds. No hepatomegaly. No rebound/guarding. No obvious abdominal masses. Msk:  Strength and tone normal for age. Extremities/Skin: Warm and dry. No clubbing or cyanosis. No edema. No rashes or suspicious lesions.  Monofilament negative for sensory loss. Neuro: Alert and oriented X 3. Moves all extremities spontaneously. Gait is normal. CNII-XII grossly in tact. Psych:  Responds to questions appropriately with a normal affect.   Labs: Results for orders placed or performed in visit on 06/26/14  POCT glycosylated hemoglobin (Hb A1C)  Result Value Ref Range   Hemoglobin A1C 6.4     ASSESSMENT AND PLAN:  64 y.o. year old male with  This chart was scribed in my presence and reviewed by me personally.    ICD-9-CM ICD-10-CM   1. Diabetes type 2, uncontrolled 250.02 E11.65 HM Diabetes Foot Exam     POCT glycosylated hemoglobin (Hb A1C)     Signed, Robyn Haber, MD 06/26/2014 1:00 PM

## 2014-07-25 ENCOUNTER — Telehealth: Payer: Self-pay | Admitting: *Deleted

## 2014-07-25 NOTE — Telephone Encounter (Signed)
Patient is taking lisinopril

## 2014-07-25 NOTE — Telephone Encounter (Signed)
A NP called and stated the pt is not on any medications to protect his kidneys and pt believes he needs to be on some.  Please call the pt back or his NP Cassandra at [place]

## 2014-07-31 ENCOUNTER — Other Ambulatory Visit: Payer: Self-pay | Admitting: Family Medicine

## 2014-07-31 MED ORDER — METFORMIN HCL 500 MG PO TABS: 500.0000 mg | ORAL_TABLET | Freq: Two times a day (BID) | ORAL | Status: DC

## 2015-02-26 ENCOUNTER — Ambulatory Visit (INDEPENDENT_AMBULATORY_CARE_PROVIDER_SITE_OTHER): Payer: Medicare Other | Admitting: Family Medicine

## 2015-02-26 VITALS — BP 124/86 | HR 95 | Temp 98.1°F | Resp 18 | Ht 72.0 in | Wt 212.0 lb

## 2015-02-26 DIAGNOSIS — E785 Hyperlipidemia, unspecified: Secondary | ICD-10-CM

## 2015-02-26 DIAGNOSIS — M6580 Other synovitis and tenosynovitis, unspecified site: Secondary | ICD-10-CM

## 2015-02-26 DIAGNOSIS — M76891 Other specified enthesopathies of right lower limb, excluding foot: Secondary | ICD-10-CM

## 2015-02-26 MED ORDER — DICLOFENAC SODIUM 75 MG PO TBEC: 75.0000 mg | DELAYED_RELEASE_TABLET | Freq: Two times a day (BID) | ORAL | Status: DC

## 2015-02-26 MED ORDER — SIMVASTATIN 20 MG PO TABS: 20.0000 mg | ORAL_TABLET | Freq: Every day | ORAL | Status: DC

## 2015-02-26 MED ORDER — METFORMIN HCL 500 MG PO TABS: 500.0000 mg | ORAL_TABLET | Freq: Two times a day (BID) | ORAL | Status: DC

## 2015-02-26 NOTE — Progress Notes (Signed)
This chart was scribed for Andrew Haber, MD by [name], medical scribe at [facility].The patient was seen in exam room 12 and the patient's care was started at 2:34 PM.  Patient ID: Andrew Tanner MRN: [id number], DOB: 1950/05/12, 64 y.o. Date of Encounter: 02/26/2015  Primary Physician: Andrew Haber, MD  Chief Complaint:  Chief Complaint  Patient presents with  . Knee Pain    right knee, described as a stinging pain. Started about 3 weeks ago.     HPI:  Andrew Tanner is a 64 y.o. male who presents to [facility] complaining of right knee pain started about 3 weeks ago. He is walking 3 times a week, 3 miles a day. He was putting on his shoe when he lifts his right leg and felt stinging pain. He put his leg down and the pain subsided. He started walking and noticed the pain return. He tried ibuprofen and applied heat pad for mild relief.   He also requested medication refill.   Patient is a Hospital doctor  Past Medical History  Diagnosis Date  . Diabetes mellitus without complication ([place])   . Cataract   . Hyperlipidemia   . Hypertension      Home Meds: Prior to Admission medications   Medication Sig Start Date End Date Taking? Authorizing Provider  amoxicillin-clavulanate (AUGMENTIN) 875-125 MG per tablet Take 1 tablet by mouth 2 (two) times daily. 03/21/14   Andrew Haber, MD  Canagliflozin-Metformin HCl (INVOKAMET) 50-1000 MG TABS Take 1 tablet by mouth 1 day or 1 dose. Please call me if the coupon I sent does not cover the once a day prescription for the next year. 03/12/14   Andrew Haber, MD  citalopram (CELEXA) 40 MG tablet Take 40 mg by mouth daily.    Historical Provider, MD  clonazePAM (KLONOPIN) 0.5 MG tablet Take 0.5 mg by mouth 3 (three) times daily as needed.    Historical Provider, MD  fish oil-omega-3 fatty acids 1000 MG capsule Take 2 g by mouth daily.    Historical Provider, MD  ibuprofen  (ADVIL,MOTRIN) 800 MG tablet Take 1 tablet (800 mg total) by mouth every 8 (eight) hours as needed for pain. 01/09/12   Shawnee Knapp, MD  lisinopril-hydrochlorothiazide (PRINZIDE,ZESTORETIC) 20-25 MG per tablet Take 1 tablet by mouth daily. 02/21/13   Andrew Haber, MD  metFORMIN (GLUCOPHAGE) 500 MG tablet Take 1 tablet (500 mg total) by mouth 2 (two) times daily with a meal. 07/31/14   Andrew Haber, MD  Multiple Vitamin (MULTIVITAMIN) tablet Take 1 tablet by mouth daily.    Historical Provider, MD  omeprazole (PRILOSEC) 40 MG capsule Take 1 capsule (40 mg total) by mouth daily. 01/09/12   Shawnee Knapp, MD  rosuvastatin (CRESTOR) 10 MG tablet Take 1 tablet (10 mg total) by mouth daily. 03/11/14   Andrew Haber, MD  simvastatin (ZOCOR) 20 MG tablet Take 1 tablet (20 mg total) by mouth at bedtime. 03/12/14   Andrew Haber, MD    Allergies: No Known Allergies  Social History   Social History  . Marital Status: Married    Spouse Name: [name]  . Number of Children: N/A  . Years of Education: N/A   Occupational History  . Not on file.   Social History Main Topics  . Smoking status: Former Research scientist (life sciences)  . Smokeless tobacco: Not on file     Comment: quit 28 yrs ago  . Alcohol Use: Yes  Comment: 1 glass wine per week  . Drug Use: No  . Sexual Activity: Not on file   Other Topics Concern  . Not on file   Social History Narrative     Review of Systems: Constitutional: negative for fever, chills, night sweats, weight changes, or fatigue  HEENT: negative for vision changes, hearing loss, congestion, rhinorrhea, ST, epistaxis, or sinus pressure Cardiovascular: negative for chest pain or palpitations Respiratory: negative for hemoptysis, wheezing, shortness of breath, or cough Abdominal: negative for abdominal pain, nausea, vomiting, diarrhea, or constipation Dermatological: negative for rash Neurologic: negative for headache, dizziness, or syncope Musc: positive for arthralgia  (right knee)  All other systems reviewed and are otherwise negative with the exception to those above and in the HPI.  Physical Exam: Blood pressure 124/86, pulse 95, temperature 98.1 F (36.7 C), temperature source Oral, resp. rate 18, height 6' (1.829 m), weight 212 lb (96.163 kg), SpO2 97 %., Body mass index is 28.75 kg/(m^2). General: Well developed, well nourished, in no acute distress. Head: Normocephalic, atraumatic, eyes without discharge, sclera non-icteric, nares are without discharge. Bilateral auditory canals clear, TM's are without perforation, pearly grey and translucent with reflective cone of light bilaterally. Oral cavity moist, posterior pharynx without exudate, erythema, peritonsillar abscess, or post nasal drip.  Neck: Supple. No thyromegaly. Full ROM. No lymphadenopathy. Lungs: Clear bilaterally to auscultation without wheezes, rales, or rhonchi. Breathing is unlabored. Heart: RRR with S1 S2. No murmurs, rubs, or gallops appreciated. Msk:  Tender over right knee cap superior aspect Extremities/Skin: Warm and dry. No clubbing or cyanosis. No edema. No rashes or suspicious lesions. Neuro: Alert and oriented X 3. Moves all extremities spontaneously. Gait is normal. CNII-XII grossly in tact. Psych:  Responds to questions appropriately with a normal affect.     ASSESSMENT AND PLAN:  64 y.o. year old male with  This chart was scribed in my presence and reviewed by me personally.    ICD-9-CM ICD-10-CM   1. Hyperlipidemia 272.4 E78.5 simvastatin (ZOCOR) 20 MG tablet  2. Tendonitis of knee, right 727.09 M65.80 diclofenac (VOLTAREN) 75 MG EC tablet     By signing my name below, I, [name], attest that this documentation has been prepared under the direction and in the presence of Andrew Haber, MD. Electronically Signed: [name], Scribe. 02/26/2015 , 2:38 PM .  Signed, Andrew Haber, MD 02/26/2015 2:38 PM

## 2015-07-22 DIAGNOSIS — F3341 Major depressive disorder, recurrent, in partial remission: Secondary | ICD-10-CM | POA: Diagnosis not present

## 2015-11-23 DIAGNOSIS — F3341 Major depressive disorder, recurrent, in partial remission: Secondary | ICD-10-CM | POA: Diagnosis not present

## 2016-03-25 LAB — HM DIABETES EYE EXAM

## 2016-04-21 DIAGNOSIS — F3341 Major depressive disorder, recurrent, in partial remission: Secondary | ICD-10-CM | POA: Diagnosis not present

## 2016-05-09 ENCOUNTER — Encounter: Payer: Self-pay | Admitting: Family Medicine

## 2016-09-14 DIAGNOSIS — F3341 Major depressive disorder, recurrent, in partial remission: Secondary | ICD-10-CM | POA: Diagnosis not present

## 2016-12-22 ENCOUNTER — Ambulatory Visit (INDEPENDENT_AMBULATORY_CARE_PROVIDER_SITE_OTHER): Payer: Medicare Other | Admitting: Family Medicine

## 2016-12-22 ENCOUNTER — Encounter: Payer: Self-pay | Admitting: Family Medicine

## 2016-12-22 VITALS — BP 169/102 | HR 98 | Temp 97.6°F | Resp 16 | Ht 72.0 in | Wt 204.0 lb

## 2016-12-22 DIAGNOSIS — R809 Proteinuria, unspecified: Secondary | ICD-10-CM | POA: Diagnosis not present

## 2016-12-22 DIAGNOSIS — Z9119 Patient's noncompliance with other medical treatment and regimen: Secondary | ICD-10-CM | POA: Diagnosis not present

## 2016-12-22 DIAGNOSIS — Z91199 Patient's noncompliance with other medical treatment and regimen due to unspecified reason: Secondary | ICD-10-CM

## 2016-12-22 DIAGNOSIS — E1129 Type 2 diabetes mellitus with other diabetic kidney complication: Secondary | ICD-10-CM | POA: Diagnosis not present

## 2016-12-22 DIAGNOSIS — I10 Essential (primary) hypertension: Secondary | ICD-10-CM

## 2016-12-22 DIAGNOSIS — E785 Hyperlipidemia, unspecified: Secondary | ICD-10-CM

## 2016-12-22 MED ORDER — ATORVASTATIN CALCIUM 10 MG PO TABS
10.0000 mg | ORAL_TABLET | Freq: Every day | ORAL | 1 refills | Status: DC
Start: 2016-12-22 — End: 2017-03-31

## 2016-12-22 MED ORDER — METFORMIN HCL 500 MG PO TABS
500.0000 mg | ORAL_TABLET | Freq: Two times a day (BID) | ORAL | 1 refills | Status: DC
Start: 2016-12-22 — End: 2017-01-03

## 2016-12-22 MED ORDER — LISINOPRIL-HYDROCHLOROTHIAZIDE 20-25 MG PO TABS: 1.0000 | ORAL_TABLET | Freq: Every day | ORAL | 1 refills | Status: DC

## 2016-12-22 NOTE — Patient Instructions (Addendum)
We can check some baseline bloodwork, then follow up in next 2 weeks to discuss plan further. For now, restart metformin 566m twice per day. Restart walking for exercise- low intensity, but exercise most days per week.   See information on different tests and standards for diabetes. We can catch up on some more of these next visit.  Please follow-up to discuss your hand symptoms. At that point we can look back through previous records and possible repeat imaging if needed.  Return to the clinic or go to the nearest emergency room if any of your symptoms worsen or new symptoms occur.   Diabetes Mellitus and Standards of Medical Care Managing diabetes (diabetes mellitus) can be complicated. Your diabetes treatment may be managed by a team of health care providers, including:  A diet and nutrition specialist (registered dietitian).  A nurse.  A certified diabetes educator (CDE).  A diabetes specialist (endocrinologist).  An eye doctor.  A primary care provider.  A dentist.  Your health care providers follow a schedule in order to help you get the best quality of care. The following schedule is a general guideline for your diabetes management plan. Your health care providers may also give you more specific instructions. HbA1c ( hemoglobin A1c) test This test provides information about blood sugar (glucose) control over the previous 2-3 months. It is used to check whether your diabetes management plan needs to be adjusted.  If you are meeting your treatment goals, this test is done at least 2 times a year.  If you are not meeting treatment goals or if your treatment goals have changed, this test is done 4 times a year.  Blood pressure test  This test is done at every routine medical visit. For most people, the goal is less than 130/80. Ask your health care provider what your goal blood pressure should be. Dental and eye exams  Visit your dentist two times a year.  If you have  type 1 diabetes, get an eye exam 3-5 years after you are diagnosed, and then once a year after your first exam. ? If you were diagnosed with type 1 diabetes as a child, get an eye exam when you are age 7420or older and have had diabetes for 3-5 years. After the first exam, you should get an eye exam once a year.  If you have type 2 diabetes, have an eye exam as soon as you are diagnosed, and then once a year after your first exam. Foot care exam  Visual foot exams are done at every routine medical visit. The exams check for cuts, bruises, redness, blisters, sores, or other problems with the feet.  A complete foot exam is done by your health care provider once a year. This exam includes an inspection of the structure and skin of your feet, and a check of the pulses and sensation in your feet. ? Type 1 diabetes: Get your first exam 3-5 years after diagnosis. ? Type 2 diabetes: Get your first exam as soon as you are diagnosed.  Check your feet every day for cuts, bruises, redness, blisters, or sores. If you have any of these or other problems that are not healing, contact your health care provider. Kidney function test ( urine microalbumin)  This test is done once a year. ? Type 1 diabetes: Get your first test 5 years after diagnosis. ? Type 2 diabetes: Get your first test as soon as you are diagnosed.  If you have chronic kidney disease (  CKD), get a serum creatinine and estimated glomerular filtration rate (eGFR) test once a year. Lipid profile (cholesterol, HDL, LDL, triglycerides)  This test should be done when you are diagnosed with diabetes, and every 5 years after the first test. If you are on medicines to lower your cholesterol, you may need to get this test done every year. ? The goal for LDL is less than 100 mg/dL (5.5 mmol/L). If you are at high risk, the goal is less than 70 mg/dL (3.9 mmol/L). ? The goal for HDL is 40 mg/dL (2.2 mmol/L) for men and 50 mg/dL(2.8 mmol/L) for women. An  HDL cholesterol of 60 mg/dL (3.3 mmol/L) or higher gives some protection against heart disease. ? The goal for triglycerides is less than 150 mg/dL (8.3 mmol/L). Immunizations  The yearly flu (influenza) vaccine is recommended for everyone 6 months or older who has diabetes.  The pneumonia (pneumococcal) vaccine is recommended for everyone 2 years or older who has diabetes. If you are 37 or older, you may get the pneumonia vaccine as a series of two separate shots.  The hepatitis B vaccine is recommended for adults shortly after they have been diagnosed with diabetes.  The Tdap (tetanus, diphtheria, and pertussis) vaccine should be given: ? According to normal childhood vaccination schedules, for children. ? Every 10 years, for adults who have diabetes.  The shingles vaccine is recommended for people who have had chicken pox and are 50 years or older. Mental and emotional health  Screening for symptoms of eating disorders, anxiety, and depression is recommended at the time of diagnosis and afterward as needed. If your screening shows that you have symptoms (you have a positive screening result), you may need further evaluation and be referred to a mental health care provider. Diabetes self-management education  Education about how to manage your diabetes is recommended at diagnosis and ongoing as needed. Treatment plan  Your treatment plan will be reviewed at every medical visit. Summary  Managing diabetes (diabetes mellitus) can be complicated. Your diabetes treatment may be managed by a team of health care providers.  Your health care providers follow a schedule in order to help you get the best quality of care.  Standards of care including having regular physical exams, blood tests, blood pressure monitoring, immunizations, screening tests, and education about how to manage your diabetes.  Your health care providers may also give you more specific instructions based on your  individual health. This information is not intended to replace advice given to you by your health care provider. Make sure you discuss any questions you have with your health care provider. Document Released: 01/23/2009 Document Revised: 12/25/2015 Document Reviewed: 12/25/2015 Elsevier Interactive Patient Education  2018 [employer].    IF you received an x-ray today, you will receive an invoice from [facility]. Please contact [facility] at [phone] with questions or concerns regarding your invoice.   IF you received labwork today, you will receive an invoice from [place]. Please contact [facility] at [phone] with questions or concerns regarding your invoice.   Our billing staff will not be able to assist you with questions regarding bills from these companies.  You will be contacted with the lab results as soon as they are available. The fastest way to get your results is to activate your My Chart account. Instructions are located on the last page of this paperwork. If you have not heard from [place] regarding the results in 2 weeks, please contact this office.

## 2016-12-22 NOTE — Progress Notes (Signed)
Subjective:  By signing my name below, I, [name], attest that this documentation has been prepared under the direction and in the presence of [name], MD. Electronically Signed: [name], [place]. 12/22/2016 , 3:53 PM .  Patient was seen in Room 11 .   Patient ID: Andrew Tanner, male    DOB: 16-Jan-1951, 66 y.o.   MRN: [id number] Chief Complaint  Patient presents with  . Follow-up    DM  . Extremity Weakness    bilateral hand weakness, hx of cervical surgery  . Depression    4 on screening see's psychiatrist for these issues   HPI Andrew Tanner is a 67 y.o. male  He is a new patient to me; previous patient of Dr. Joseph Art. His last visit was in Nov 2016. His last meal was 12:30PM today.   He was originally from [place]; moved down about 33 years ago.   DM Lab Results  Component Value Date   HGBA1C 6.4 06/26/2014   Lab Results  Component Value Date   MICROALBUR 3.8 (H) 03/10/2014   He was prescribe invokamet 50-1073m QD, but was cost prohibited. He's been on metformin 506mQD since his last visit. His last medication taken was about 1-2 months ago. He was taking metformin 50069must once a day. He denies abdominal pain. He reports going to the bathroom, urgently about once every hour. He denies any troubles when taking metformin BID. He was walking previously but now less due to having part time job. He plans to restart walking as exercise.   Optho: Last seen in Feb 2018, denies diabetic retinopathy.   HTN He is previously lisinopril-HCTZ 20-67m72m.   Lab Results  Component Value Date   CREATININE 0.88 03/10/2014   He denies any side effects when taking this medication. He denies any chest tightness, chest pain or shortness of breath. He does note having some left shoulder pain occasionally, but denies radiating down into his chest.   Hyperlipidemia Lab Results  Component Value Date   CHOL 284 (H) 03/10/2014   HDL 35 (L) 03/10/2014   LDLCALC 180 (H) 03/10/2014   TRIG 347 (H) 03/10/2014   CHOLHDL 8.1 03/10/2014   Patient has taken Crestor and Zocor in the past; appears he has not taken either medications at this time. He reports last taking Simvastatin, and was taken off of it when previously informed not needing it anymore based on labs. He denies knowledge of issues when taking statins. He denies taking OTC fish oil supplements.   Hand weakness He has had multiple surgeries over his left hand in the past. He's been having weakness coming on for about 5-6 months.   Positive PHQ / depression screening He's followed by a psychiatrist, Dr. GeraNorma Fredrickson is on Abilify, Celexa, and Klonopin.   Depression screen PHQ [facility] 12/22/2016 02/26/2015 03/20/2014  Decreased Interest 1 0 0  Down, Depressed, Hopeless 1 0 0  PHQ - 2 Score 2 0 0  Altered sleeping 0 - -  Tired, decreased energy 1 - -  Change in appetite 0 - -  Feeling bad or failure about yourself  0 - -  Trouble concentrating 1 - -  Moving slowly or fidgety/restless 0 - -  Suicidal thoughts 0 - -  PHQ-9 Score 4 - -  Difficult doing work/chores Not difficult at all - -     Patient Active Problem List   Diagnosis Date Noted  . Diabetes mellitus type II 06/02/2011  . Hypertension  06/02/2011  . Hyperlipemia 06/02/2011  . Cataract 06/02/2011   Past Medical History:  Diagnosis Date  . Cataract   . Diabetes mellitus without complication ([place])   . Hyperlipidemia   . Hypertension    Past Surgical History:  Procedure Laterality Date  . HERNIA REPAIR    . NECK SURGERY    . SPINE SURGERY    . TRANSURETHRAL RESECTION OF PROSTATE    . TURP VAPORIZATION     No Known Allergies Prior to Admission medications   Medication Sig Start Date End Date Taking? Authorizing Provider  ARIPiprazole (ABILIFY) 2 MG tablet Take 2 mg by mouth daily.   Yes [provider]  Canagliflozin-Metformin HCl (INVOKAMET) 50-1000 MG TABS Take 1 tablet by mouth 1 day or 1 dose.  Please call me if the coupon I sent does not cover the once a day prescription for the next year. 03/12/14  Yes Robyn Haber, MD  citalopram (CELEXA) 40 MG tablet Take 40 mg by mouth daily.   Yes [provider]  clonazePAM (KLONOPIN) 0.5 MG tablet Take 0.5 mg by mouth 3 (three) times daily as needed.   Yes [provider]  metFORMIN (GLUCOPHAGE) 500 MG tablet Take 1 tablet (500 mg total) by mouth 2 (two) times daily with a meal. 02/26/15  Yes Robyn Haber, MD  amoxicillin-clavulanate (AUGMENTIN) 875-125 MG per tablet Take 1 tablet by mouth 2 (two) times daily. Patient not taking: Reported on 02/26/2015 03/21/14   Robyn Haber, MD  diclofenac (VOLTAREN) 75 MG EC tablet Take 1 tablet (75 mg total) by mouth 2 (two) times daily. Patient not taking: Reported on 12/22/2016 02/26/15   Robyn Haber, MD  fish oil-omega-3 fatty acids 1000 MG capsule Take 2 g by mouth daily.    [provider]  ibuprofen (ADVIL,MOTRIN) 800 MG tablet Take 1 tablet (800 mg total) by mouth every 8 (eight) hours as needed for pain. Patient not taking: Reported on 12/22/2016 01/09/12   Shawnee Knapp, MD  lisinopril-hydrochlorothiazide (PRINZIDE,ZESTORETIC) 20-25 MG per tablet Take 1 tablet by mouth daily. Patient not taking: Reported on 12/22/2016 02/21/13   Robyn Haber, MD  Multiple Vitamin (MULTIVITAMIN) tablet Take 1 tablet by mouth daily.    [provider]  omeprazole (PRILOSEC) 40 MG capsule Take 1 capsule (40 mg total) by mouth daily. Patient not taking: Reported on 12/22/2016 01/09/12   Shawnee Knapp, MD  rosuvastatin (CRESTOR) 10 MG tablet Take 1 tablet (10 mg total) by mouth daily. Patient not taking: Reported on 12/22/2016 03/11/14   Robyn Haber, MD  simvastatin (ZOCOR) 20 MG tablet Take 1 tablet (20 mg total) by mouth at bedtime. Patient not taking: Reported on 12/22/2016 02/26/15   Robyn Haber, MD   Social History   Social History  . Marital status: Married      Spouse name: [name]  . Number of children: N/A  . Years of education: N/A   Occupational History  . Not on file.   Social History Main Topics  . Smoking status: Former Research scientist (life sciences)  . Smokeless tobacco: Never Used     Comment: quit 28 yrs ago  . Alcohol use Yes     Comment: 1 glass wine per week  . Drug use: No  . Sexual activity: Not on file   Other Topics Concern  . Not on file   Social History Narrative  . No narrative on file   Review of Systems  Constitutional: Negative for fatigue and unexpected weight change.  Eyes: Negative for visual disturbance.  Respiratory: Negative for cough, chest tightness and shortness of breath.   Cardiovascular: Negative for chest pain, palpitations and leg swelling.  Gastrointestinal: Negative for abdominal pain and blood in stool.  Neurological: Positive for weakness. Negative for dizziness, light-headedness and headaches.       Objective:   Physical Exam  Constitutional: He is oriented to person, place, and time. He appears well-developed and well-nourished.  HENT:  Head: Normocephalic and atraumatic.  Eyes: Pupils are equal, round, and reactive to light. EOM are normal.  Neck: No JVD present. Carotid bruit is not present.  Cardiovascular: Normal rate, regular rhythm and normal heart sounds.   No murmur heard. Pulmonary/Chest: Effort normal and breath sounds normal. He has no rales.  Musculoskeletal: He exhibits no edema.  Neurological: He is alert and oriented to person, place, and time.  Skin: Skin is warm and dry.  Psychiatric: He has a normal mood and affect.  Vitals reviewed.   Vitals:   12/22/16 1523  BP: (!) 169/102  Pulse: 98  Resp: 16  Temp: 97.6 F (36.4 C)  TempSrc: Oral  SpO2: 98%  Weight: 204 lb (92.5 kg)  Height: 6' (1.829 m)      Assessment & Plan:    Andrew Tanner is a 66 y.o. male Type 2 diabetes mellitus with microalbuminuria, without long-term current use of insulin ([place]) - Plan:  metFORMIN (GLUCOPHAGE) 500 MG tablet, Hemoglobin A1c, Microalbumin, urine  - Medication noncompliance, restart metformin initially, but likely will need stronger medication. Importance of medication adherence and follow-up discussed including potential risks of uncontrolled diabetes  - Advised to proceed to ER if any nausea/vomiting/abdominal pain.  -Walking for exercise most days per week  - Recheck in 2 weeks, standards of care for diabetes given on handout.  Essential hypertension - Plan: lisinopril-hydrochlorothiazide (PRINZIDE,ZESTORETIC) 20-25 MG tablet, Comprehensive metabolic panel  - Check labs, uncontrolled off meds. Restart lisinopril/HCTZ 20/25 mg daily.   Hyperlipidemia, unspecified hyperlipidemia type - Plan: atorvastatin (LIPITOR) 10 MG tablet, Comprehensive metabolic panel, Lipid panel  - Restart Lipitor 10 mg daily, check CMP, lipid panel.   Nonadherence to medical treatment  - As above, follow-up in 2 weeks to review labs and discuss further plan on med changes.  Reported long-standing bilateral hand weakness, with history of cervical surgery. As these are not acute issues as of today, he plans to follow-up to discuss this further in the next few weeks. ER/RTC precautions if worsening sooner  Meds ordered this encounter  Medications  . ARIPiprazole (ABILIFY) 2 MG tablet    Sig: Take 2 mg by mouth daily.  Marland Kitchen atorvastatin (LIPITOR) 10 MG tablet    Sig: Take 1 tablet (10 mg total) by mouth daily at 6 PM.    Dispense:  90 tablet    Refill:  1  . lisinopril-hydrochlorothiazide (PRINZIDE,ZESTORETIC) 20-25 MG tablet    Sig: Take 1 tablet by mouth daily.    Dispense:  90 tablet    Refill:  1  . metFORMIN (GLUCOPHAGE) 500 MG tablet    Sig: Take 1 tablet (500 mg total) by mouth 2 (two) times daily with a meal.    Dispense:  180 tablet    Refill:  1   Patient Instructions    We can check some baseline bloodwork, then follow up in next 2 weeks to discuss plan further. For  now, restart metformin 564m twice per day. Restart walking for exercise- low intensity, but exercise most days per week.  See information on different tests and standards for diabetes. We can catch up on some more of these next visit.  Please follow-up to discuss your hand symptoms. At that point we can look back through previous records and possible repeat imaging if needed.  Return to the clinic or go to the nearest emergency room if any of your symptoms worsen or new symptoms occur.   Diabetes Mellitus and Standards of Medical Care Managing diabetes (diabetes mellitus) can be complicated. Your diabetes treatment may be managed by a team of health care providers, including:  A diet and nutrition specialist (registered dietitian).  A nurse.  A certified diabetes educator (CDE).  A diabetes specialist (endocrinologist).  An eye doctor.  A primary care provider.  A dentist.  Your health care providers follow a schedule in order to help you get the best quality of care. The following schedule is a general guideline for your diabetes management plan. Your health care providers may also give you more specific instructions. HbA1c ( hemoglobin A1c) test This test provides information about blood sugar (glucose) control over the previous 2-3 months. It is used to check whether your diabetes management plan needs to be adjusted.  If you are meeting your treatment goals, this test is done at least 2 times a year.  If you are not meeting treatment goals or if your treatment goals have changed, this test is done 4 times a year.  Blood pressure test  This test is done at every routine medical visit. For most people, the goal is less than 130/80. Ask your health care provider what your goal blood pressure should be. Dental and eye exams  Visit your dentist two times a year.  If you have type 1 diabetes, get an eye exam 3-5 years after you are diagnosed, and then once a year after your  first exam. ? If you were diagnosed with type 1 diabetes as a child, get an eye exam when you are age 57 or older and have had diabetes for 3-5 years. After the first exam, you should get an eye exam once a year.  If you have type 2 diabetes, have an eye exam as soon as you are diagnosed, and then once a year after your first exam. Foot care exam  Visual foot exams are done at every routine medical visit. The exams check for cuts, bruises, redness, blisters, sores, or other problems with the feet.  A complete foot exam is done by your health care provider once a year. This exam includes an inspection of the structure and skin of your feet, and a check of the pulses and sensation in your feet. ? Type 1 diabetes: Get your first exam 3-5 years after diagnosis. ? Type 2 diabetes: Get your first exam as soon as you are diagnosed.  Check your feet every day for cuts, bruises, redness, blisters, or sores. If you have any of these or other problems that are not healing, contact your health care provider. Kidney function test ( urine microalbumin)  This test is done once a year. ? Type 1 diabetes: Get your first test 5 years after diagnosis. ? Type 2 diabetes: Get your first test as soon as you are diagnosed.  If you have chronic kidney disease (CKD), get a serum creatinine and estimated glomerular filtration rate (eGFR) test once a year. Lipid profile (cholesterol, HDL, LDL, triglycerides)  This test should be done when you are diagnosed with diabetes, and every 5 years after the  first test. If you are on medicines to lower your cholesterol, you may need to get this test done every year. ? The goal for LDL is less than 100 mg/dL (5.5 mmol/L). If you are at high risk, the goal is less than 70 mg/dL (3.9 mmol/L). ? The goal for HDL is 40 mg/dL (2.2 mmol/L) for men and 50 mg/dL(2.8 mmol/L) for women. An HDL cholesterol of 60 mg/dL (3.3 mmol/L) or higher gives some protection against heart  disease. ? The goal for triglycerides is less than 150 mg/dL (8.3 mmol/L). Immunizations  The yearly flu (influenza) vaccine is recommended for everyone 6 months or older who has diabetes.  The pneumonia (pneumococcal) vaccine is recommended for everyone 2 years or older who has diabetes. If you are 66 or older, you may get the pneumonia vaccine as a series of two separate shots.  The hepatitis B vaccine is recommended for adults shortly after they have been diagnosed with diabetes.  The Tdap (tetanus, diphtheria, and pertussis) vaccine should be given: ? According to normal childhood vaccination schedules, for children. ? Every 10 years, for adults who have diabetes.  The shingles vaccine is recommended for people who have had chicken pox and are 50 years or older. Mental and emotional health  Screening for symptoms of eating disorders, anxiety, and depression is recommended at the time of diagnosis and afterward as needed. If your screening shows that you have symptoms (you have a positive screening result), you may need further evaluation and be referred to a mental health care provider. Diabetes self-management education  Education about how to manage your diabetes is recommended at diagnosis and ongoing as needed. Treatment plan  Your treatment plan will be reviewed at every medical visit. Summary  Managing diabetes (diabetes mellitus) can be complicated. Your diabetes treatment may be managed by a team of health care providers.  Your health care providers follow a schedule in order to help you get the best quality of care.  Standards of care including having regular physical exams, blood tests, blood pressure monitoring, immunizations, screening tests, and education about how to manage your diabetes.  Your health care providers may also give you more specific instructions based on your individual health. This information is not intended to replace advice given to you by your  health care provider. Make sure you discuss any questions you have with your health care provider. Document Released: 01/23/2009 Document Revised: 12/25/2015 Document Reviewed: 12/25/2015 Elsevier Interactive Patient Education  2018 [employer].    IF you received an x-ray today, you will receive an invoice from [facility]. Please contact [facility] at [phone] with questions or concerns regarding your invoice.   IF you received labwork today, you will receive an invoice from [facility]. Please contact [facility] at [phone] with questions or concerns regarding your invoice.   Our billing staff will not be able to assist you with questions regarding bills from these companies.  You will be contacted with the lab results as soon as they are available. The fastest way to get your results is to activate your My Chart account. Instructions are located on the last page of this paperwork. If you have not heard from [place] regarding the results in 2 weeks, please contact this office.       I personally performed the services described in this documentation, which was scribed in my presence. The recorded information has been reviewed and considered for accuracy and completeness, addended by me as needed, and agree with  information above.  Signed,   [name], MD [facility].  12/24/16 2:46 PM

## 2016-12-23 LAB — LIPID PANEL
CHOL/HDL RATIO: 7.2 ratio — AB (ref 0.0–5.0)
Cholesterol, Total: 302 mg/dL — ABNORMAL HIGH (ref 100–199)
HDL: 42 mg/dL (ref >39)
TRIGLYCERIDES: 429 mg/dL — AB (ref 0–149)

## 2016-12-23 LAB — COMPREHENSIVE METABOLIC PANEL
A/G RATIO: 2.1 (ref 1.2–2.2)
ALT: 20 IU/L (ref 0–44)
AST: 15 IU/L (ref 0–40)
Albumin: 4.4 g/dL (ref 3.6–4.8)
Alkaline Phosphatase: 83 IU/L (ref 39–117)
BUN/Creatinine Ratio: 18 (ref 10–24)
BUN: 14 mg/dL (ref 8–27)
Bilirubin Total: 0.3 mg/dL (ref 0.0–1.2)
CALCIUM: 9.8 mg/dL (ref 8.6–10.2)
CO2: 19 mmol/L — AB (ref 20–29)
Chloride: 95 mmol/L — ABNORMAL LOW (ref 96–106)
Creatinine, Ser: 0.76 mg/dL (ref 0.76–1.27)
GFR calc Af Amer: 109 mL/min/{1.73_m2} (ref >59)
GFR, EST NON AFRICAN AMERICAN: 94 mL/min/{1.73_m2} (ref >59)
GLUCOSE: 362 mg/dL — AB (ref 65–99)
Globulin, Total: 2.1 g/dL (ref 1.5–4.5)
POTASSIUM: 4.7 mmol/L (ref 3.5–5.2)
Sodium: 133 mmol/L — ABNORMAL LOW (ref 134–144)
TOTAL PROTEIN: 6.5 g/dL (ref 6.0–8.5)

## 2016-12-23 LAB — MICROALBUMIN, URINE: MICROALBUM., U, RANDOM: 21.8 ug/mL

## 2016-12-23 LAB — HEMOGLOBIN A1C
Est. average glucose Bld gHb Est-mCnc: 303 mg/dL
Hgb A1c MFr Bld: 12.2 % — ABNORMAL HIGH (ref 4.8–5.6)

## 2017-01-03 ENCOUNTER — Encounter: Payer: Self-pay | Admitting: Family Medicine

## 2017-01-03 ENCOUNTER — Ambulatory Visit (INDEPENDENT_AMBULATORY_CARE_PROVIDER_SITE_OTHER): Payer: Medicare Other | Admitting: Family Medicine

## 2017-01-03 VITALS — BP 141/94 | HR 92 | Temp 98.5°F | Resp 17 | Ht 72.5 in | Wt 200.0 lb

## 2017-01-03 DIAGNOSIS — R292 Abnormal reflex: Secondary | ICD-10-CM

## 2017-01-03 DIAGNOSIS — E1165 Type 2 diabetes mellitus with hyperglycemia: Secondary | ICD-10-CM

## 2017-01-03 DIAGNOSIS — R29898 Other symptoms and signs involving the musculoskeletal system: Secondary | ICD-10-CM | POA: Diagnosis not present

## 2017-01-03 DIAGNOSIS — E785 Hyperlipidemia, unspecified: Secondary | ICD-10-CM

## 2017-01-03 DIAGNOSIS — I10 Essential (primary) hypertension: Secondary | ICD-10-CM

## 2017-01-03 MED ORDER — CANAGLIFLOZIN-METFORMIN HCL 50-1000 MG PO TABS: 1.0000 | ORAL_TABLET | Freq: Two times a day (BID) | ORAL | 2 refills | Status: DC

## 2017-01-03 NOTE — Patient Instructions (Addendum)
  Keep a record of your blood pressures outside of the office and bring them to the next office visit.goal of under 130/80 with diabetes.   I changed diabetes med to [place]. One pill twice per day. Continue to monitor blood sugars and recheck in the next 1-2 weeks with those readings. If you have nausea, vomiting, abdominal pain, or any new or worsening symptoms, be seen immediately here or in the emergency room.  For your hand weakness, I would like you to meet with new orthopaedist to determine if other imaging or intervention needed for your neck with prior surgery.     IF you received an x-ray today, you will receive an invoice from [facility]. Please contact [facility] at [phone] with questions or concerns regarding your invoice.   IF you received labwork today, you will receive an invoice from [facility]. Please contact [facility] at [phone] with questions or concerns regarding your invoice.   Our billing staff will not be able to assist you with questions regarding bills from these companies.  You will be contacted with the lab results as soon as they are available. The fastest way to get your results is to activate your My Chart account. Instructions are located on the last page of this paperwork. If you have not heard from [place] regarding the results in 2 weeks, please contact this office.

## 2017-01-03 NOTE — Progress Notes (Signed)
Subjective:  By signing my name below, I, [name], attest that this documentation has been prepared under the direction and in the presence of [name], MD Electronically Signed: Ladene Artist, ED Scribe 01/03/2017 at 8:15 AM.   Patient ID: Andrew Tanner, male    DOB: [birth year], 66 y.o.   MRN: [id number]  Chief Complaint  Patient presents with  . Diabetes   HPI Andrew Tanner is a 66 y.o. male who presents to Primary Care at [facility] for follow-up on diabetes. Last seen September 13. Had been out of medication for diabetes at that time.   Diabetes  Previously prescribed invokamet but cost prohibited. Had been taking 50-1000 mg qd, down to once a day then off meds for 2 months. Initially restarted on Metformin 500 mg bid. Advised to exercise. A1C was uncontrolled at 12.2. Pt has been checking his blood glucose regularly since last visit with a fasting morning glucose today of 388. Reports some right quadrant abdominal pain that resolves with standing. Denies nausea, vomiting, diarrhea. Pt has been exercising and has a fit bit that is set to 5000 steps daily.   HTN Uncontrolled at last visit off medications. Restarted on Lisinopril-HCTZ 20-25 mg qd at last visit. BP was 169/102 at last visit off meds. Pt has been compliant with medication. He has not checked his BP outside of the office. Denies light-headedness, dizziness, chest pain, sob. Reports white coat HTN.  Hyperlipidemia Lab Results  Component Value Date   CHOL 302 (H) 12/22/2016   HDL 42 12/22/2016   LDLCALC Comment 12/22/2016   TRIG 429 (H) 12/22/2016   CHOLHDL 7.2 (H) 12/22/2016    Lab Results  Component Value Date   ALT 20 12/22/2016   AST 15 12/22/2016   ALKPHOS 83 12/22/2016   BILITOT 0.3 12/22/2016  Restarted Lipitor 10 mg qd at last visit.   Bilateral Hand Weakness H/o cervical laminectomy by neurosurgeon Dimas Alexandria, MD. Briefly mentioned last visit. Pt has noticed weakness in both hands  and difficulty opening bags for the past 1-2 years. Denies neck pain.   Patient Active Problem List   Diagnosis Date Noted  . Diabetes mellitus type II 06/02/2011  . Hypertension 06/02/2011  . Hyperlipemia 06/02/2011  . Cataract 06/02/2011   Past Medical History:  Diagnosis Date  . Cataract   . Diabetes mellitus without complication ([place])   . Hyperlipidemia   . Hypertension    Past Surgical History:  Procedure Laterality Date  . HERNIA REPAIR    . NECK SURGERY    . SPINE SURGERY    . TRANSURETHRAL RESECTION OF PROSTATE    . TURP VAPORIZATION     No Known Allergies Prior to Admission medications   Medication Sig Start Date End Date Taking? Authorizing Provider  ARIPiprazole (ABILIFY) 2 MG tablet Take 2 mg by mouth daily.   Yes [provider]  atorvastatin (LIPITOR) 10 MG tablet Take 1 tablet (10 mg total) by mouth daily at 6 PM. 12/22/16  Yes [name], MD  citalopram (CELEXA) 40 MG tablet Take 40 mg by mouth daily.   Yes [provider]  clonazePAM (KLONOPIN) 0.5 MG tablet Take 0.5 mg by mouth 3 (three) times daily as needed.   Yes [provider]  fish oil-omega-3 fatty acids 1000 MG capsule Take 2 g by mouth daily.   Yes [provider]  ibuprofen (ADVIL,MOTRIN) 800 MG tablet Take 1 tablet (800 mg total) by mouth every 8 (eight) hours as needed  for pain. 01/09/12  Yes Shawnee Knapp, MD  lisinopril-hydrochlorothiazide (PRINZIDE,ZESTORETIC) 20-25 MG tablet Take 1 tablet by mouth daily. 12/22/16  Yes [name], MD  Multiple Vitamin (MULTIVITAMIN) tablet Take 1 tablet by mouth daily.   Yes [provider]   Social History   Social History  . Marital status: Married    Spouse name: [name]  . Number of children: N/A  . Years of education: N/A   Occupational History  . Not on file.   Social History Main Topics  . Smoking status: Former Research scientist (life sciences)  . Smokeless tobacco: Never Used     Comment: quit 28 yrs ago  .  Alcohol use Yes     Comment: 1 glass wine per week  . Drug use: No  . Sexual activity: No   Other Topics Concern  . Not on file   Social History Narrative  . No narrative on file   Review of Systems  Constitutional: Negative for fatigue and unexpected weight change.  Eyes: Negative for visual disturbance.  Respiratory: Negative for cough, chest tightness and shortness of breath.   Cardiovascular: Negative for chest pain, palpitations and leg swelling.  Gastrointestinal: Positive for abdominal pain. Negative for blood in stool, diarrhea, nausea and vomiting.  Musculoskeletal: Negative for neck pain.  Neurological: Positive for weakness (hands). Negative for dizziness, light-headedness and headaches.      Objective:   Physical Exam  Constitutional: He is oriented to person, place, and time. He appears well-developed and well-nourished.  HENT:  Head: Normocephalic and atraumatic.  Eyes: Pupils are equal, round, and reactive to light. EOM are normal.  Neck: No JVD present. Carotid bruit is not present.  Decreased extension. Decreased lateral flexion but equal bilaterally. Minimal decreased lateral flexion but equal bilaterally. C-spine is nontender.   Cardiovascular: Normal rate, regular rhythm and normal heart sounds.   No murmur heard. Pulmonary/Chest: Effort normal and breath sounds normal. He has no rales.  Musculoskeletal: He exhibits no edema.  Neurological: He is alert and oriented to person, place, and time.  Reflex Scores:      Tricep reflexes are 2+ on the right side and 2+ on the left side.      Bicep reflexes are 2+ on the right side and 2+ on the left side.      Brachioradialis reflexes are 0 on the right side and 1+ on the left side. Equal grip strength.  Skin: Skin is warm and dry.  Psychiatric: He has a normal mood and affect.  Vitals reviewed.  Vitals:   01/03/17 0803  BP: (!) 141/94  Pulse: 92  Resp: 17  Temp: 98.5 F (36.9 C)  TempSrc: Oral  SpO2: 98%   Weight: 200 lb (90.7 kg)  Height: 6' 0.5" (1.842 m)      Assessment & Plan:   Andrew Tanner is a 66 y.o. male Type 2 diabetes mellitus with hyperglycemia, without long-term current use of insulin ([place]) - Plan: Canagliflozin-Metformin HCl (INVOKAMET) 50-1000 MG TABS, Basic metabolic panel  - Previous medication nonadherence and without recent follow-up. Restarted on metformin last visit, still with elevated readings. Increase metformin to 1000 g twice a day, and added Invokana. Potential peripheral vascular disease/amputation risks were discussed. Understanding expressed.  Essential hypertension - Plan: Basic metabolic panel  - Improved from last visit, some component of whitecoat hypertension per his report. Monitor blood pressures outside of office, continue same regimen for now.  Decreased reflex - Plan: Ambulatory referral to Spine Surgery Hand  weakness - Plan: Ambulatory referral to Spine Surgery  - Prior cervical surgery, may have some progression of degenerative disease. Imaging deferred here as this has been ongoing, and plan on referring to spine specialist to determine if MRI or nerve conduction studies may be needed.  Hyperlipidemia, unspecified hyperlipidemia type  - Tolerating Lipitor at current dose, recheck lipids in approximately 6 weeks.  Recheck in 1-2 weeks, RTC/ER precautions given worsening sooner, especially with elevated readings as above.  Meds ordered this encounter  Medications  . Canagliflozin-Metformin HCl (INVOKAMET) 50-1000 MG TABS    Sig: Take 1 tablet by mouth 2 (two) times daily.    Dispense:  60 tablet    Refill:  2   Patient Instructions    Keep a record of your blood pressures outside of the office and bring them to the next office visit.goal of under 130/80 with diabetes.   I changed diabetes med to Aquilla. One pill twice per day. Continue to monitor blood sugars and recheck in the next 1-2 weeks with those readings. If you have nausea,  vomiting, abdominal pain, or any new or worsening symptoms, be seen immediately here or in the emergency room.  For your hand weakness, I would like you to meet with new orthopaedist to determine if other imaging or intervention needed for your neck with prior surgery.     IF you received an x-ray today, you will receive an invoice from [facility]. Please contact [facility] at [phone] with questions or concerns regarding your invoice.   IF you received labwork today, you will receive an invoice from [facility]. Please contact [facility] at [phone] with questions or concerns regarding your invoice.   Our billing staff will not be able to assist you with questions regarding bills from these companies.  You will be contacted with the lab results as soon as they are available. The fastest way to get your results is to activate your My Chart account. Instructions are located on the last page of this paperwork. If you have not heard from [place] regarding the results in 2 weeks, please contact this office.       I personally performed the services described in this documentation, which was scribed in my presence. The recorded information has been reviewed and considered for accuracy and completeness, addended by me as needed, and agree with information above.  Signed,   [name], MD [facility].  01/03/17 8:34 AM

## 2017-01-04 LAB — BASIC METABOLIC PANEL
BUN / CREAT RATIO: 19 (ref 10–24)
BUN: 17 mg/dL (ref 8–27)
CO2: 22 mmol/L (ref 20–29)
CREATININE: 0.88 mg/dL (ref 0.76–1.27)
Calcium: 10.2 mg/dL (ref 8.6–10.2)
Chloride: 96 mmol/L (ref 96–106)
GFR calc Af Amer: 103 mL/min/{1.73_m2} (ref >59)
GFR calc non Af Amer: 89 mL/min/{1.73_m2} (ref >59)
GLUCOSE: 438 mg/dL — AB (ref 65–99)
POTASSIUM: 4.6 mmol/L (ref 3.5–5.2)
SODIUM: 136 mmol/L (ref 134–144)

## 2017-01-17 ENCOUNTER — Encounter: Payer: Self-pay | Admitting: Family Medicine

## 2017-01-17 ENCOUNTER — Ambulatory Visit (INDEPENDENT_AMBULATORY_CARE_PROVIDER_SITE_OTHER): Payer: Medicare Other | Admitting: Family Medicine

## 2017-01-17 VITALS — BP 122/72 | HR 108 | Temp 99.0°F | Resp 17 | Ht 72.5 in | Wt 198.0 lb

## 2017-01-17 DIAGNOSIS — E785 Hyperlipidemia, unspecified: Secondary | ICD-10-CM | POA: Diagnosis not present

## 2017-01-17 DIAGNOSIS — I10 Essential (primary) hypertension: Secondary | ICD-10-CM | POA: Diagnosis not present

## 2017-01-17 DIAGNOSIS — E1165 Type 2 diabetes mellitus with hyperglycemia: Secondary | ICD-10-CM | POA: Diagnosis not present

## 2017-01-17 NOTE — Progress Notes (Signed)
Subjective:  This chart was scribed for [name], MD by Tamsen Roers, at [place] at [facility].  This patient was seen in room 1 and the patient's care was started at 10:24 AM.   Chief Complaint  Patient presents with  . Follow-up    diabetes      Patient ID: Andrew Tanner, male    DOB: 19-Aug-1950, 66 y.o.   MRN: [id number]  HPI HPI Comments: Andrew Tanner is a 66 y.o. male who presents to Primary Care at [facility] for a follow up on diabetes. Last seen September 25 th. A1C was uncontrolled at 12.2 when seen on September 13 th. He had been off medications initially, restarted on Metformin 500 mg BID, follow up September 25 th. Changed to Invokamet 50-1000 mg BID as still with elevated readings in the 300's. ---- Patient states that his blood sugar levels have started to drop. He has been having readings around 183, yesterday:198. Highest 220-226. He has decreased his intake of wine (used to drink 2 glasses every night) and tries to walk daily/be more active in his yard.   He does not have to urinate frequently as he did in the past.    Hyperlipidemia:  Difficult to interpret results with hyperglycemia and elevated triglycerides. He was tolerating restart of Lipitor at last visit, plan on repeat labs in 6 weeks. -----Patient is compliant with his Lipitor and denies any side effects. Lab Results  Component Value Date   CHOL 302 (H) 12/22/2016   HDL 42 12/22/2016   LDLCALC Comment 12/22/2016   TRIG 429 (H) 12/22/2016   CHOLHDL 7.2 (H) 12/22/2016   Hypertension: Patient is on Lisinopril HCTZ 20-2 mg.  He is compliant with this medication and denies any side effects.  Home blood pressure readings have been ranging around 116/82 up to 152/107 but most recently controlled under 140/90.      Patient Active Problem List   Diagnosis Date Noted  . Diabetes mellitus type II 06/02/2011  . Hypertension 06/02/2011  . Hyperlipemia 06/02/2011  . Cataract 06/02/2011    Past Medical History:  Diagnosis Date  . Cataract   . Diabetes mellitus without complication ([place])   . Hyperlipidemia   . Hypertension    Past Surgical History:  Procedure Laterality Date  . HERNIA REPAIR    . NECK SURGERY    . SPINE SURGERY    . TRANSURETHRAL RESECTION OF PROSTATE    . TURP VAPORIZATION     No Known Allergies Prior to Admission medications   Medication Sig Start Date End Date Taking? Authorizing Provider  ARIPiprazole (ABILIFY) 2 MG tablet Take 2 mg by mouth daily.    [provider]  atorvastatin (LIPITOR) 10 MG tablet Take 1 tablet (10 mg total) by mouth daily at 6 PM. 12/22/16   [name], MD  Canagliflozin-Metformin HCl (INVOKAMET) 50-1000 MG TABS Take 1 tablet by mouth 2 (two) times daily. 01/03/17   [name], MD  citalopram (CELEXA) 40 MG tablet Take 40 mg by mouth daily.    [provider]  clonazePAM (KLONOPIN) 0.5 MG tablet Take 0.5 mg by mouth 3 (three) times daily as needed.    [provider]  fish oil-omega-3 fatty acids 1000 MG capsule Take 2 g by mouth daily.    [provider]  ibuprofen (ADVIL,MOTRIN) 800 MG tablet Take 1 tablet (800 mg total) by mouth every 8 (eight) hours as needed for pain. 01/09/12   Shawnee Knapp, MD  lisinopril-hydrochlorothiazide (PRINZIDE,ZESTORETIC) 20-25 MG tablet Take 1 tablet by mouth daily. 12/22/16   [name], MD  Multiple Vitamin (MULTIVITAMIN) tablet Take 1 tablet by mouth daily.    [provider]   Social History   Social History  . Marital status: Married    Spouse name: [name]  . Number of children: N/A  . Years of education: N/A   Occupational History  . Not on file.   Social History Main Topics  . Smoking status: Former Research scientist (life sciences)  . Smokeless tobacco: Never Used     Comment: quit 28 yrs ago  . Alcohol use Yes     Comment: 1 glass wine per week  . Drug use: No  . Sexual activity: No   Other Topics Concern  . Not on file    Social History Narrative  . No narrative on file      Review of Systems  Constitutional: Negative for fatigue and unexpected weight change.  Eyes: Negative for visual disturbance.  Respiratory: Negative for cough, chest tightness and shortness of breath.   Cardiovascular: Negative for chest pain, palpitations and leg swelling.  Gastrointestinal: Negative for abdominal pain and blood in stool.  Neurological: Negative for dizziness, light-headedness and headaches.       Objective:   Physical Exam  Constitutional: He is oriented to person, place, and time. He appears well-developed and well-nourished.  HENT:  Head: Normocephalic and atraumatic.  Eyes: Pupils are equal, round, and reactive to light. EOM are normal.  Neck: No JVD present. Carotid bruit is not present.  Cardiovascular: Normal rate, regular rhythm and normal heart sounds.   No murmur heard. Pulmonary/Chest: Effort normal and breath sounds normal. He has no rales.  Musculoskeletal: He exhibits no edema.  Neurological: He is alert and oriented to person, place, and time.  Skin: Skin is warm and dry.  Psychiatric: He has a normal mood and affect.  Vitals reviewed.  Vitals:   01/17/17 0950  BP: 122/72  Pulse: (!) 108  Resp: 17  Temp: 99 F (37.2 C)  TempSrc: Oral  SpO2: 98%  Weight: 198 lb (89.8 kg)  Height: 6' 0.5" (1.842 m)       Assessment & Plan:   Andrew Tanner is a 66 y.o. male Type 2 diabetes mellitus with hyperglycemia, without long-term current use of insulin ([place])  - Improving home readings, has also made great strides in diet and exercise/activity. Home readings still running a little high, but will wait on further medication changes as his diet and exercise should continue to improve those numbers. Recheck in 4-6 weeks.  Hyperlipidemia, unspecified hyperlipidemia type  - Tolerating Lipitor, also expect this to improve with diet and exercise. Recheck lipids in approximate 6  weeks.  Essential hypertension  - Still has some elevated readings towards end of September, but recently are trending towards normal. Anticipate continued improvement with diet and exercise, no med changes for now, recheck in 4-6 weeks   No orders of the defined types were placed in this encounter.  Patient Instructions   It looks like the Invokamet is really helping the blood sugars. You may still need to be on additional medicine, but can see if continued avoidance of wine and other diet changes, as well as other exercise will help.  Blood pressure is also heading the right direction. No changes for now, recheck in 6 weeks.   Keep up the good work!  Tips for Eating Away From Home If You Have Diabetes Controlling  your level of blood glucose, also known as blood sugar, can be challenging. It can be even more difficult when you do not prepare your own meals. The following tips can help you manage your diabetes when you eat away from home. Planning ahead Plan ahead if you know you will be eating away from home:  Ask your health care provider how to time meals and medicine if you are taking insulin.  Make a list of restaurants near you that offer healthy choices. If they have a carry-out menu, take it home and plan what you will order ahead of time.  Look up the restaurant you want to eat at online. Many chain and fast-food restaurants list nutritional information online. Use this information to choose the healthiest options and to calculate how many carbohydrates will be in your meal.  Use a carbohydrate-counting book or mobile app to look up the carbohydrate content and serving size of the foods you want to eat.  Become familiar with serving sizes and learn to recognize how many servings are in a portion. This will allow you to estimate how many carbohydrates you can eat.  Free foods A "free food" is any food or drink that has less than 5 g of carbohydrates per serving. Free foods  include:  Many vegetables.  Hard boiled eggs.  Nuts or seeds.  Olives.  Cheeses.  Meats.  These types of foods make good appetizer choices and are often available at salad bars. Lemon juice, vinegar, or a low-calorie salad dressing of fewer than 20 calories per serving can be used as a "free" salad dressing. Choices to reduce carbohydrates  Substitute nonfat sweetened yogurt with a sugar-free yogurt. Yogurt made from soy milk may also be used, but you will still want a sugar-free or plain option to choose a lower carbohydrate amount.  Ask your server to take away the bread basket or chips from your table.  Order fresh fruit. A salad bar often offers fresh fruit choices. Avoid canned fruit because it is usually packed in sugar or syrup.  Order a salad, and eat it without dressing. Or, create a "free" salad dressing.  Ask for substitutions. For example, instead of [place] fries, request an order of a vegetable such as salad, green beans, or broccoli. Other tips  If you take insulin, take the insulin once your food arrives to your table. This will ensure your insulin and food are timed correctly.  Ask your server about the portion size before your order, and ask for a take-out box if the portion has more servings than you should have. When your food comes, leave the amount you should have on the plate, and put the rest in the take-out box.  Consider splitting an entree with someone and ordering a side salad. This information is not intended to replace advice given to you by your health care provider. Make sure you discuss any questions you have with your health care provider. Document Released: 03/28/2005 Document Revised: 09/03/2015 Document Reviewed: 06/25/2013 Elsevier Interactive Patient Education  2018 [employer]. Diabetes Mellitus and Food It is important for you to manage your blood sugar (glucose) level. Your blood glucose level can be greatly affected by what you eat.  Eating healthier foods in the appropriate amounts throughout the day at about the same time each day will help you control your blood glucose level. It can also help slow or prevent worsening of your diabetes mellitus. Healthy eating may even help you improve the level of  your blood pressure and reach or maintain a healthy weight. General recommendations for healthful eating and cooking habits include:  Eating meals and snacks regularly. Avoid going long periods of time without eating to lose weight.  Eating a diet that consists mainly of plant-based foods, such as fruits, vegetables, nuts, legumes, and whole grains.  Using low-heat cooking methods, such as baking, instead of high-heat cooking methods, such as deep frying.  Work with your dietitian to make sure you understand how to use the Nutrition Facts information on food labels. How can food affect me? Carbohydrates Carbohydrates affect your blood glucose level more than any other type of food. Your dietitian will help you determine how many carbohydrates to eat at each meal and teach you how to count carbohydrates. Counting carbohydrates is important to keep your blood glucose at a healthy level, especially if you are using insulin or taking certain medicines for diabetes mellitus. Alcohol Alcohol can cause sudden decreases in blood glucose (hypoglycemia), especially if you use insulin or take certain medicines for diabetes mellitus. Hypoglycemia can be a life-threatening condition. Symptoms of hypoglycemia (sleepiness, dizziness, and disorientation) are similar to symptoms of having too much alcohol. If your health care provider has given you approval to drink alcohol, do so in moderation and use the following guidelines:  Women should not have more than one drink per day, and men should not have more than two drinks per day. One drink is equal to: ? 12 oz of beer. ? 5 oz of wine. ? 1 oz of hard liquor.  Do not drink on an empty  stomach.  Keep yourself hydrated. Have water, diet soda, or unsweetened iced tea.  Regular soda, juice, and other mixers might contain a lot of carbohydrates and should be counted.  What foods are not recommended? As you make food choices, it is important to remember that all foods are not the same. Some foods have fewer nutrients per serving than other foods, even though they might have the same number of calories or carbohydrates. It is difficult to get your body what it needs when you eat foods with fewer nutrients. Examples of foods that you should avoid that are high in calories and carbohydrates but low in nutrients include:  Trans fats (most processed foods list trans fats on the Nutrition Facts label).  Regular soda.  Juice.  Candy.  Sweets, such as cake, pie, doughnuts, and cookies.  Fried foods.  What foods can I eat? Eat nutrient-rich foods, which will nourish your body and keep you healthy. The food you should eat also will depend on several factors, including:  The calories you need.  The medicines you take.  Your weight.  Your blood glucose level.  Your blood pressure level.  Your cholesterol level.  You should eat a variety of foods, including:  Protein. ? Lean cuts of meat. ? Proteins low in saturated fats, such as fish, egg whites, and beans. Avoid processed meats.  Fruits and vegetables. ? Fruits and vegetables that may help control blood glucose levels, such as apples, mangoes, and yams.  Dairy products. ? Choose fat-free or low-fat dairy products, such as milk, yogurt, and cheese.  Grains, bread, pasta, and rice. ? Choose whole grain products, such as multigrain bread, whole oats, and brown rice. These foods may help control blood pressure.  Fats. ? Foods containing healthful fats, such as nuts, avocado, olive oil, canola oil, and fish.  Does everyone with diabetes mellitus have the same meal plan? Because  every person with diabetes mellitus  is different, there is not one meal plan that works for everyone. It is very important that you meet with a dietitian who will help you create a meal plan that is just right for you. This information is not intended to replace advice given to you by your health care provider. Make sure you discuss any questions you have with your health care provider. Document Released: 12/23/2004 Document Revised: 09/03/2015 Document Reviewed: 02/22/2013 Elsevier Interactive Patient Education  2017 [employer].    IF you received an x-ray today, you will receive an invoice from [facility]. Please contact [facility] at [phone] with questions or concerns regarding your invoice.   IF you received labwork today, you will receive an invoice from [facility]. Please contact [facility] at [phone] with questions or concerns regarding your invoice.   Our billing staff will not be able to assist you with questions regarding bills from these companies.  You will be contacted with the lab results as soon as they are available. The fastest way to get your results is to activate your My Chart account. Instructions are located on the last page of this paperwork. If you have not heard from [place] regarding the results in 2 weeks, please contact this office.       I personally performed the services described in this documentation, which was scribed in my presence. The recorded information has been reviewed and considered for accuracy and completeness, addended by me as needed, and agree with information above.  Signed,   [name], MD Primary [facility] at [place].  01/17/17 10:54 AM

## 2017-01-17 NOTE — Patient Instructions (Addendum)
It looks like the Invokamet is really helping the blood sugars. You may still need to be on additional medicine, but can see if continued avoidance of wine and other diet changes, as well as other exercise will help.  Blood pressure is also heading the right direction. No changes for now, recheck in 6 weeks.   Keep up the good work!  Tips for Eating Away From Home If You Have Diabetes Controlling your level of blood glucose, also known as blood sugar, can be challenging. It can be even more difficult when you do not prepare your own meals. The following tips can help you manage your diabetes when you eat away from home. Planning ahead Plan ahead if you know you will be eating away from home:  Ask your health care provider how to time meals and medicine if you are taking insulin.  Make a list of restaurants near you that offer healthy choices. If they have a carry-out menu, take it home and plan what you will order ahead of time.  Look up the restaurant you want to eat at online. Many chain and fast-food restaurants list nutritional information online. Use this information to choose the healthiest options and to calculate how many carbohydrates will be in your meal.  Use a carbohydrate-counting book or mobile app to look up the carbohydrate content and serving size of the foods you want to eat.  Become familiar with serving sizes and learn to recognize how many servings are in a portion. This will allow you to estimate how many carbohydrates you can eat.  Free foods A "free food" is any food or drink that has less than 5 g of carbohydrates per serving. Free foods include:  Many vegetables.  Hard boiled eggs.  Nuts or seeds.  Olives.  Cheeses.  Meats.  These types of foods make good appetizer choices and are often available at salad bars. Lemon juice, vinegar, or a low-calorie salad dressing of fewer than 20 calories per serving can be used as a "free" salad dressing. Choices to  reduce carbohydrates  Substitute nonfat sweetened yogurt with a sugar-free yogurt. Yogurt made from soy milk may also be used, but you will still want a sugar-free or plain option to choose a lower carbohydrate amount.  Ask your server to take away the bread basket or chips from your table.  Order fresh fruit. A salad bar often offers fresh fruit choices. Avoid canned fruit because it is usually packed in sugar or syrup.  Order a salad, and eat it without dressing. Or, create a "free" salad dressing.  Ask for substitutions. For example, instead of [place] fries, request an order of a vegetable such as salad, green beans, or broccoli. Other tips  If you take insulin, take the insulin once your food arrives to your table. This will ensure your insulin and food are timed correctly.  Ask your server about the portion size before your order, and ask for a take-out box if the portion has more servings than you should have. When your food comes, leave the amount you should have on the plate, and put the rest in the take-out box.  Consider splitting an entree with someone and ordering a side salad. This information is not intended to replace advice given to you by your health care provider. Make sure you discuss any questions you have with your health care provider. Document Released: 03/28/2005 Document Revised: 09/03/2015 Document Reviewed: 06/25/2013 Elsevier Interactive Patient Education  2018 [employer]. Diabetes  Mellitus and Food It is important for you to manage your blood sugar (glucose) level. Your blood glucose level can be greatly affected by what you eat. Eating healthier foods in the appropriate amounts throughout the day at about the same time each day will help you control your blood glucose level. It can also help slow or prevent worsening of your diabetes mellitus. Healthy eating may even help you improve the level of your blood pressure and reach or maintain a healthy  weight. General recommendations for healthful eating and cooking habits include:  Eating meals and snacks regularly. Avoid going long periods of time without eating to lose weight.  Eating a diet that consists mainly of plant-based foods, such as fruits, vegetables, nuts, legumes, and whole grains.  Using low-heat cooking methods, such as baking, instead of high-heat cooking methods, such as deep frying.  Work with your dietitian to make sure you understand how to use the Nutrition Facts information on food labels. How can food affect me? Carbohydrates Carbohydrates affect your blood glucose level more than any other type of food. Your dietitian will help you determine how many carbohydrates to eat at each meal and teach you how to count carbohydrates. Counting carbohydrates is important to keep your blood glucose at a healthy level, especially if you are using insulin or taking certain medicines for diabetes mellitus. Alcohol Alcohol can cause sudden decreases in blood glucose (hypoglycemia), especially if you use insulin or take certain medicines for diabetes mellitus. Hypoglycemia can be a life-threatening condition. Symptoms of hypoglycemia (sleepiness, dizziness, and disorientation) are similar to symptoms of having too much alcohol. If your health care provider has given you approval to drink alcohol, do so in moderation and use the following guidelines:  Women should not have more than one drink per day, and men should not have more than two drinks per day. One drink is equal to: ? 12 oz of beer. ? 5 oz of wine. ? 1 oz of hard liquor.  Do not drink on an empty stomach.  Keep yourself hydrated. Have water, diet soda, or unsweetened iced tea.  Regular soda, juice, and other mixers might contain a lot of carbohydrates and should be counted.  What foods are not recommended? As you make food choices, it is important to remember that all foods are not the same. Some foods have fewer  nutrients per serving than other foods, even though they might have the same number of calories or carbohydrates. It is difficult to get your body what it needs when you eat foods with fewer nutrients. Examples of foods that you should avoid that are high in calories and carbohydrates but low in nutrients include:  Trans fats (most processed foods list trans fats on the Nutrition Facts label).  Regular soda.  Juice.  Candy.  Sweets, such as cake, pie, doughnuts, and cookies.  Fried foods.  What foods can I eat? Eat nutrient-rich foods, which will nourish your body and keep you healthy. The food you should eat also will depend on several factors, including:  The calories you need.  The medicines you take.  Your weight.  Your blood glucose level.  Your blood pressure level.  Your cholesterol level.  You should eat a variety of foods, including:  Protein. ? Lean cuts of meat. ? Proteins low in saturated fats, such as fish, egg whites, and beans. Avoid processed meats.  Fruits and vegetables. ? Fruits and vegetables that may help control blood glucose levels, such as apples,  mangoes, and yams.  Dairy products. ? Choose fat-free or low-fat dairy products, such as milk, yogurt, and cheese.  Grains, bread, pasta, and rice. ? Choose whole grain products, such as multigrain bread, whole oats, and brown rice. These foods may help control blood pressure.  Fats. ? Foods containing healthful fats, such as nuts, avocado, olive oil, canola oil, and fish.  Does everyone with diabetes mellitus have the same meal plan? Because every person with diabetes mellitus is different, there is not one meal plan that works for everyone. It is very important that you meet with a dietitian who will help you create a meal plan that is just right for you. This information is not intended to replace advice given to you by your health care provider. Make sure you discuss any questions you have with  your health care provider. Document Released: 12/23/2004 Document Revised: 09/03/2015 Document Reviewed: 02/22/2013 Elsevier Interactive Patient Education  2017 [employer].    IF you received an x-ray today, you will receive an invoice from [facility]. Please contact [facility] at [phone] with questions or concerns regarding your invoice.   IF you received labwork today, you will receive an invoice from [facility]. Please contact [facility] at [phone] with questions or concerns regarding your invoice.   Our billing staff will not be able to assist you with questions regarding bills from these companies.  You will be contacted with the lab results as soon as they are available. The fastest way to get your results is to activate your My Chart account. Instructions are located on the last page of this paperwork. If you have not heard from [place] regarding the results in 2 weeks, please contact this office.

## 2017-02-09 DIAGNOSIS — F3341 Major depressive disorder, recurrent, in partial remission: Secondary | ICD-10-CM | POA: Diagnosis not present

## 2017-02-16 ENCOUNTER — Encounter: Payer: Self-pay | Admitting: Family Medicine

## 2017-02-16 ENCOUNTER — Ambulatory Visit (INDEPENDENT_AMBULATORY_CARE_PROVIDER_SITE_OTHER): Payer: Medicare Other | Admitting: Family Medicine

## 2017-02-16 VITALS — BP 122/70 | HR 98 | Temp 98.7°F | Resp 16 | Ht 72.5 in | Wt 200.2 lb

## 2017-02-16 DIAGNOSIS — H6121 Impacted cerumen, right ear: Secondary | ICD-10-CM | POA: Diagnosis not present

## 2017-02-16 DIAGNOSIS — E785 Hyperlipidemia, unspecified: Secondary | ICD-10-CM

## 2017-02-16 DIAGNOSIS — Z23 Encounter for immunization: Secondary | ICD-10-CM

## 2017-02-16 DIAGNOSIS — E1165 Type 2 diabetes mellitus with hyperglycemia: Secondary | ICD-10-CM | POA: Diagnosis not present

## 2017-02-16 NOTE — Progress Notes (Signed)
Subjective:  This chart was scribed for Andrew Agreste, MD by Tamsen Roers, at [place] [facility].  This patient was seen in room 10 and the patient's care was started at 9:41 AM.   Chief Complaint  Patient presents with  . Diabetes    follow up DM, patient does not need refills.   . Cerumen Impaction    R ear     Patient ID: Andrew Tanner, male    DOB: 11-26-1950, 66 y.o.   MRN: [id number]  HPI HPI Comments: Andrew Tanner is a 66 y.o. male who presents to Primary Care at [facility] for a follow up on diabetes.  He also complains of right ear blockage today.     Right ear blockage: Patient states that his right ear blockage started when the weather started changing- these symptoms occur every year.  He has tried using hydrogen peroxide and distilled water but denies any relief.  Patient denies any pain but is having decreased hearing from his right ear.  He does not have any complaints regarding his left ear.    Weakness in fingers: Patient has also been having an itching sensation in his fingers along with weakness/trembling (thinks it may be due to his neck).  See previous visit, was referred to neurosurgery in September to discuss these symptoms, plans to reschedule at this time.    Diabetes: Last seen October 9 th. See prior history.  Handout was given on diet at home and away from home at last visit.  His home readings had improved to the high 100's- low 200's at last visit. Currently on Invokament 50-1000 mg BID. He had been uncontrolled and off medicines at visit on September 13. --- Patient is compliant with his medication. He has stopped drinking alcohol since his last visit (stopped completely).  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 He is getting home readings around the 180s.  He does get abdominal pain about once every two weeks with some diarrhea. He feels that he is making progress and  feels "really good".   Lab Results  Component Value Date   HGBA1C 12.2 (H) 12/22/2016    Hyperlipidemia: Lipitor was restarted in September. ---- Patient has been compliant with this medication and denies any side effects.  Lab Results  Component Value Date   CHOL 302 (H) 12/22/2016   HDL 42 12/22/2016   LDLCALC Comment 12/22/2016   TRIG 429 (H) 12/22/2016   CHOLHDL 7.2 (H) 12/22/2016      Patient Active Problem List   Diagnosis Date Noted  . Diabetes mellitus type II 06/02/2011  . Hypertension 06/02/2011  . Hyperlipemia 06/02/2011  . Cataract 06/02/2011   Past Medical History:  Diagnosis Date  . Cataract   . Diabetes mellitus without complication ([place])   . Hyperlipidemia   . Hypertension    Past Surgical History:  Procedure Laterality Date  . HERNIA REPAIR    . NECK SURGERY    . SPINE SURGERY    . TRANSURETHRAL RESECTION OF PROSTATE    . TURP VAPORIZATION     No Known Allergies Prior to Admission medications   Medication Sig Start Date End Date Taking? Authorizing Provider  ARIPiprazole (ABILIFY) 2 MG tablet Take 2 mg by mouth daily.   Yes [provider]  atorvastatin (LIPITOR) 10 MG tablet Take 1 tablet (10 mg total) by mouth daily at 6 PM. 12/22/16  Yes  Andrew Agreste, MD  Canagliflozin-Metformin HCl (INVOKAMET) 50-1000 MG TABS Take 1 tablet by mouth 2 (two) times daily. 01/03/17  Yes Andrew Agreste, MD  citalopram (CELEXA) 40 MG tablet Take 40 mg by mouth daily.   Yes [provider]  clonazePAM (KLONOPIN) 0.5 MG tablet Take 0.5 mg by mouth 3 (three) times daily as needed.   Yes [provider]  fish oil-omega-3 fatty acids 1000 MG capsule Take 2 g by mouth daily.   Yes [provider]  ibuprofen (ADVIL,MOTRIN) 800 MG tablet Take 1 tablet (800 mg total) by mouth every 8 (eight) hours as needed for pain. 01/09/12  Yes Shawnee Knapp, MD  lisinopril-hydrochlorothiazide (PRINZIDE,ZESTORETIC) 20-25 MG tablet Take 1 tablet by mouth  daily. 12/22/16  Yes Andrew Agreste, MD  Multiple Vitamin (MULTIVITAMIN) tablet Take 1 tablet by mouth daily.   Yes [provider]   Social History   Socioeconomic History  . Marital status: Married    Spouse name: [name]  . Number of children: Not on file  . Years of education: Not on file  . Highest education level: Not on file  Social Needs  . Financial resource strain: Not on file  . Food insecurity - worry: Not on file  . Food insecurity - inability: Not on file  . Transportation needs - medical: Not on file  . Transportation needs - non-medical: Not on file  Occupational History  . Not on file  Tobacco Use  . Smoking status: Former Research scientist (life sciences)  . Smokeless tobacco: Never Used  . Tobacco comment: quit 28 yrs ago  Substance and Sexual Activity  . Alcohol use: Yes    Comment: 1 glass wine per week  . Drug use: No  . Sexual activity: No  Other Topics Concern  . Not on file  Social History Narrative  . Not on file     Review of Systems  Constitutional: Negative for chills, fatigue, fever and unexpected weight change.  HENT:       Right ear blockage, decreased hearing  Eyes: Negative for pain, redness and visual disturbance.  Respiratory: Negative for cough, chest tightness and shortness of breath.   Cardiovascular: Negative for chest pain, palpitations and leg swelling.  Gastrointestinal: Negative for abdominal pain, blood in stool, nausea and vomiting.  Neurological: Negative for dizziness, syncope, speech difficulty, light-headedness and headaches.       Weakness- fingers       Objective:   Physical Exam  Constitutional: He is oriented to person, place, and time. He appears well-developed and well-nourished.  HENT:  Head: Normocephalic and atraumatic.  Left ear: Moderate cerumen but not impacted.  Right ear: occluded with cerumen, unable to visualize TM, no canal erythema or edema.   Eyes: EOM are normal. Pupils are equal, round, and reactive  to light.  Neck: No JVD present. Carotid bruit is not present.  Cardiovascular: Normal rate, regular rhythm and normal heart sounds.  No murmur heard. Pulmonary/Chest: Effort normal and breath sounds normal. He has no rales.  Musculoskeletal: He exhibits no edema.  Neurological: He is alert and oriented to person, place, and time.  Skin: Skin is warm and dry.  Psychiatric: He has a normal mood and affect.  Vitals reviewed.  Vitals:   02/16/17 0922  Pulse: 98  Resp: 16  Temp: 98.7 F (37.1 C)  TempSrc: Oral  SpO2: 97%  Weight: 200 lb 3.2 oz (90.8 kg)  Height: 6' 0.5" (1.842 m)  Assessment & Plan:   Cuauhtemoc Huegel is a 66 y.o. male Type 2 diabetes mellitus with hyperglycemia, without long-term current use of insulin ([place]) - Plan: Hemoglobin A1c  - still some elevated readings, but reports improvement with meds and changes in diet. Check A1c in 1 month with fasting lab visit.   Hyperlipidemia, unspecified hyperlipidemia type - Plan: Lipid panel, Comprehensive metabolic panel  - tolerating Lipitor, plan on future labs in 1 month.   Needs flu shot - Plan: Flu Vaccine QUAD 36+ mos IM   Need for prophylactic vaccination against Streptococcus pneumoniae (pneumococcus) - Plan: Pneumococcal conjugate vaccine 13-valent IM  Impacted cerumen of right ear - Plan: Ear wax removal  - lavage successfully perfomed in office. rtc precautions.   No orders of the defined types were placed in this encounter.  Patient Instructions   Return in 1 month for lab only visit - 8 hours fasting for that bloodwork, then depending on results, can meet to discuss changes.   Call neurosurgeon for appointment for neck and hand symptoms: [facility]  Address: [address], [place], [place] [address]  Phone: [phone]   Earwax Buildup, Adult The ears produce a substance called earwax that helps keep bacteria out of the ear and protects the skin in the ear canal.  Occasionally, earwax can build up in the ear and cause discomfort or hearing loss. What increases the risk? This condition is more likely to develop in people who:  Are male.  Are elderly.  Naturally produce more earwax.  Clean their ears often with cotton swabs.  Use earplugs often.  Use in-ear headphones often.  Wear hearing aids.  Have narrow ear canals.  Have earwax that is overly thick or sticky.  Have eczema.  Are dehydrated.  Have excess hair in the ear canal.  What are the signs or symptoms? Symptoms of this condition include:  Reduced or muffled hearing.  A feeling of fullness in the ear or feeling that the ear is plugged.  Fluid coming from the ear.  Ear pain.  Ear itch.  Ringing in the ear.  Coughing.  An obvious piece of earwax that can be seen inside the ear canal.  How is this diagnosed? This condition may be diagnosed based on:  Your symptoms.  Your medical history.  An ear exam. During the exam, your health care provider will look into your ear with an instrument called an otoscope.  You may have tests, including a hearing test. How is this treated? This condition may be treated by:  Using ear drops to soften the earwax.  Having the earwax removed by a health care provider. The health care provider may: ? Flush the ear with water. ? Use an instrument that has a loop on the end (curette). ? Use a suction device.  Surgery to remove the wax buildup. This may be done in severe cases.  Follow these instructions at home:  Take over-the-counter and prescription medicines only as told by your health care provider.  Do not put any objects, including cotton swabs, into your ear. You can clean the opening of your ear canal with a washcloth or facial tissue.  Follow instructions from your health care provider about cleaning your ears. Do not over-clean your ears.  Drink enough fluid to keep your urine clear or pale yellow. This will  help to thin the earwax.  Keep all follow-up visits as told by your health care provider. If earwax builds up in  your ears often or if you use hearing aids, consider seeing your health care provider for routine, preventive ear cleanings. Ask your health care provider how often you should schedule your cleanings.  If you have hearing aids, clean them according to instructions from the manufacturer and your health care provider. Contact a health care provider if:  You have ear pain.  You develop a fever.  You have blood, pus, or other fluid coming from your ear.  You have hearing loss.  You have ringing in your ears that does not go away.  Your symptoms do not improve with treatment.  You feel like the room is spinning (vertigo). Summary  Earwax can build up in the ear and cause discomfort or hearing loss.  The most common symptoms of this condition include reduced or muffled hearing and a feeling of fullness in the ear or feeling that the ear is plugged.  This condition may be diagnosed based on your symptoms, your medical history, and an ear exam.  This condition may be treated by using ear drops to soften the earwax or by having the earwax removed by a health care provider.  Do not put any objects, including cotton swabs, into your ear. You can clean the opening of your ear canal with a washcloth or facial tissue. This information is not intended to replace advice given to you by your health care provider. Make sure you discuss any questions you have with your health care provider. Document Released: 05/05/2004 Document Revised: 06/08/2016 Document Reviewed: 06/08/2016 Elsevier Interactive Patient Education  2018 [employer].    IF you received an x-ray today, you will receive an invoice from [facility]. Please contact [facility] at [phone] with questions or concerns regarding your invoice.   IF you received labwork today, you will receive an invoice  from [facility]. Please contact [facility] at [phone] with questions or concerns regarding your invoice.   Our billing staff will not be able to assist you with questions regarding bills from these companies.  You will be contacted with the lab results as soon as they are available. The fastest way to get your results is to activate your My Chart account. Instructions are located on the last page of this paperwork. If you have not heard from [place] regarding the results in 2 weeks, please contact this office.      I personally performed the services described in this documentation, which was scribed in my presence. The recorded information has been reviewed and considered for accuracy and completeness, addended by me as needed, and agree with information above.  Signed,   [name], MD Primary [facility] at [place].  02/16/17 9:58 AM

## 2017-02-16 NOTE — Patient Instructions (Addendum)
Return in 1 month for lab only visit - 8 hours fasting for that bloodwork, then depending on results, can meet to discuss changes.   Call neurosurgeon for appointment for neck and hand symptoms: [facility]  Address: [address], [place], [place] [address]  Phone: [phone]   Earwax Buildup, Adult The ears produce a substance called earwax that helps keep bacteria out of the ear and protects the skin in the ear canal. Occasionally, earwax can build up in the ear and cause discomfort or hearing loss. What increases the risk? This condition is more likely to develop in people who:  Are male.  Are elderly.  Naturally produce more earwax.  Clean their ears often with cotton swabs.  Use earplugs often.  Use in-ear headphones often.  Wear hearing aids.  Have narrow ear canals.  Have earwax that is overly thick or sticky.  Have eczema.  Are dehydrated.  Have excess hair in the ear canal.  What are the signs or symptoms? Symptoms of this condition include:  Reduced or muffled hearing.  A feeling of fullness in the ear or feeling that the ear is plugged.  Fluid coming from the ear.  Ear pain.  Ear itch.  Ringing in the ear.  Coughing.  An obvious piece of earwax that can be seen inside the ear canal.  How is this diagnosed? This condition may be diagnosed based on:  Your symptoms.  Your medical history.  An ear exam. During the exam, your health care provider will look into your ear with an instrument called an otoscope.  You may have tests, including a hearing test. How is this treated? This condition may be treated by:  Using ear drops to soften the earwax.  Having the earwax removed by a health care provider. The health care provider may: ? Flush the ear with water. ? Use an instrument that has a loop on the end (curette). ? Use a suction device.  Surgery to remove the wax buildup. This may be done in severe cases.  Follow  these instructions at home:  Take over-the-counter and prescription medicines only as told by your health care provider.  Do not put any objects, including cotton swabs, into your ear. You can clean the opening of your ear canal with a washcloth or facial tissue.  Follow instructions from your health care provider about cleaning your ears. Do not over-clean your ears.  Drink enough fluid to keep your urine clear or pale yellow. This will help to thin the earwax.  Keep all follow-up visits as told by your health care provider. If earwax builds up in your ears often or if you use hearing aids, consider seeing your health care provider for routine, preventive ear cleanings. Ask your health care provider how often you should schedule your cleanings.  If you have hearing aids, clean them according to instructions from the manufacturer and your health care provider. Contact a health care provider if:  You have ear pain.  You develop a fever.  You have blood, pus, or other fluid coming from your ear.  You have hearing loss.  You have ringing in your ears that does not go away.  Your symptoms do not improve with treatment.  You feel like the room is spinning (vertigo). Summary  Earwax can build up in the ear and cause discomfort or hearing loss.  The most common symptoms of this condition include reduced or muffled hearing and a feeling of fullness  in the ear or feeling that the ear is plugged.  This condition may be diagnosed based on your symptoms, your medical history, and an ear exam.  This condition may be treated by using ear drops to soften the earwax or by having the earwax removed by a health care provider.  Do not put any objects, including cotton swabs, into your ear. You can clean the opening of your ear canal with a washcloth or facial tissue. This information is not intended to replace advice given to you by your health care provider. Make sure you discuss any questions  you have with your health care provider. Document Released: 05/05/2004 Document Revised: 06/08/2016 Document Reviewed: 06/08/2016 Elsevier Interactive Patient Education  2018 [employer].    IF you received an x-ray today, you will receive an invoice from [facility]. Please contact [facility] at [phone] with questions or concerns regarding your invoice.   IF you received labwork today, you will receive an invoice from [facility]. Please contact [facility] at [phone] with questions or concerns regarding your invoice.   Our billing staff will not be able to assist you with questions regarding bills from these companies.  You will be contacted with the lab results as soon as they are available. The fastest way to get your results is to activate your My Chart account. Instructions are located on the last page of this paperwork. If you have not heard from [place] regarding the results in 2 weeks, please contact this office.

## 2017-03-24 ENCOUNTER — Telehealth: Payer: Self-pay | Admitting: Family Medicine

## 2017-03-24 NOTE — Telephone Encounter (Signed)
Copied from [facility]. Topic: Quick Communication - See Telephone Encounter >> Mar 24, 2017  1:15 PM Synthia Innocent wrote: CRM for notification. See Telephone encounter for:  Needing formulary exception Canagliflozin-Metformin HCl (INVOKAMET) 50-1000 MG TABS, please contact patients insurance

## 2017-03-24 NOTE — Telephone Encounter (Signed)
Refill with insurance question

## 2017-03-27 ENCOUNTER — Ambulatory Visit (INDEPENDENT_AMBULATORY_CARE_PROVIDER_SITE_OTHER): Payer: Medicare Other | Admitting: Family Medicine

## 2017-03-27 DIAGNOSIS — E785 Hyperlipidemia, unspecified: Secondary | ICD-10-CM

## 2017-03-27 DIAGNOSIS — E1165 Type 2 diabetes mellitus with hyperglycemia: Secondary | ICD-10-CM | POA: Diagnosis not present

## 2017-03-28 LAB — COMPREHENSIVE METABOLIC PANEL
ALBUMIN: 4.2 g/dL (ref 3.6–4.8)
ALK PHOS: 84 IU/L (ref 39–117)
ALT: 33 IU/L (ref 0–44)
AST: 16 IU/L (ref 0–40)
Albumin/Globulin Ratio: 1.9 (ref 1.2–2.2)
BILIRUBIN TOTAL: 0.3 mg/dL (ref 0.0–1.2)
BUN/Creatinine Ratio: 15 (ref 10–24)
BUN: 16 mg/dL (ref 8–27)
CHLORIDE: 102 mmol/L (ref 96–106)
CO2: 23 mmol/L (ref 20–29)
Calcium: 10.3 mg/dL — ABNORMAL HIGH (ref 8.6–10.2)
Creatinine, Ser: 1.05 mg/dL (ref 0.76–1.27)
GFR calc Af Amer: 84 mL/min/{1.73_m2} (ref >59)
GFR calc non Af Amer: 73 mL/min/{1.73_m2} (ref >59)
GLUCOSE: 272 mg/dL — AB (ref 65–99)
Globulin, Total: 2.2 g/dL (ref 1.5–4.5)
Potassium: 4.4 mmol/L (ref 3.5–5.2)
Sodium: 140 mmol/L (ref 134–144)
Total Protein: 6.4 g/dL (ref 6.0–8.5)

## 2017-03-28 LAB — LIPID PANEL
CHOLESTEROL TOTAL: 213 mg/dL — AB (ref 100–199)
Chol/HDL Ratio: 6.7 ratio — ABNORMAL HIGH (ref 0.0–5.0)
HDL: 32 mg/dL — ABNORMAL LOW (ref >39)
LDL CALC: 134 mg/dL — AB (ref 0–99)
TRIGLYCERIDES: 235 mg/dL — AB (ref 0–149)
VLDL Cholesterol Cal: 47 mg/dL — ABNORMAL HIGH (ref 5–40)

## 2017-03-28 LAB — HEMOGLOBIN A1C
ESTIMATED AVERAGE GLUCOSE: 226 mg/dL
HEMOGLOBIN A1C: 9.5 % — AB (ref 4.8–5.6)

## 2017-03-31 ENCOUNTER — Encounter: Payer: Self-pay | Admitting: Family Medicine

## 2017-03-31 ENCOUNTER — Other Ambulatory Visit: Payer: Self-pay

## 2017-03-31 ENCOUNTER — Ambulatory Visit: Payer: Medicare Other | Admitting: Family Medicine

## 2017-03-31 VITALS — BP 130/82 | HR 106 | Temp 99.1°F | Resp 18 | Ht 72.5 in | Wt 196.2 lb

## 2017-03-31 DIAGNOSIS — E785 Hyperlipidemia, unspecified: Secondary | ICD-10-CM | POA: Diagnosis not present

## 2017-03-31 DIAGNOSIS — E1165 Type 2 diabetes mellitus with hyperglycemia: Secondary | ICD-10-CM | POA: Diagnosis not present

## 2017-03-31 DIAGNOSIS — M5412 Radiculopathy, cervical region: Secondary | ICD-10-CM

## 2017-03-31 MED ORDER — CANAGLIFLOZIN-METFORMIN HCL 50-500 MG PO TABS: 50.0000 mg | ORAL_TABLET | Freq: Two times a day (BID) | ORAL | 1 refills | Status: DC

## 2017-03-31 MED ORDER — GLIPIZIDE 5 MG PO TABS: 5.0000 mg | ORAL_TABLET | Freq: Every day | ORAL | 1 refills | Status: DC

## 2017-03-31 MED ORDER — ATORVASTATIN CALCIUM 20 MG PO TABS: 20.0000 mg | ORAL_TABLET | Freq: Every day | ORAL | 1 refills | Status: DC

## 2017-03-31 NOTE — Patient Instructions (Addendum)
Plan to repeat calcium with labs in future, as only borderline today.  Increase Lipitor to 20mg  per day.  Invokamet was changed to 50/500 - take this twice per day.  Start glipizide 5mg  with meal once per day and update me on Mychart with home readings in the next 4 weeks.   Arm symptoms sound like a possible pinched nerve coming from the neck. Try changing positions and gentle range of motion of neck throughout the workday to see if that lessens frequency of symptoms. Tylenol as needed. Follow-up if the symptoms are not improving.  Return to the clinic or go to the nearest emergency room if any of your symptoms worsen or new symptoms occur.    IF you received an x-ray today, you will receive an invoice from [facility]. Please contact [facility] at [phone] with questions or concerns regarding your invoice.   IF you received labwork today, you will receive an invoice from [facility]. Please contact [facility] at [phone] with questions or concerns regarding your invoice.   Our billing staff will not be able to assist you with questions regarding bills from these companies.  You will be contacted with the lab results as soon as they are available. The fastest way to get your results is to activate your My Chart account. Instructions are located on the last page of this paperwork. If you have not heard from [place] regarding the results in 2 weeks, please contact this office.

## 2017-03-31 NOTE — Progress Notes (Addendum)
Subjective:  By signing my name below, I, [name], attest that this documentation has been prepared under the direction and in the presence of [name], MD Electronically Signed: Ladene Artist, ED Scribe 03/31/2017 at 10:16 AM.   Patient ID: Andrew Tanner, male    DOB: Jun 20, 1950, 66 y.o.   MRN: [id number]  Chief Complaint  Patient presents with  . bloodwork    follow up on labs    HPI Andrew Tanner is a 66 y.o. male who presents to Primary Care at [facility] for f/u. Last seen 11/8 for DM.   DM At 11/8 visit pt was on Invokamet 50-1000 mg bid. Had been off meds at visit 9/13 but had restarted since that time. Had stopped drinking alcohol since Sept and cut back on sodas as well. Rare diarrhea. Lab work performed 4 days ago. A1C improved but still elevated at 9.5.  Pt has been checking his blood glucose at home with readings ranging from 187-225. He reports abdominal pain with 50-1000 mg bid. States he did complete a full bottle and then he cut the tabs in half and has been taking half in the morning and half in the evenings with meals. Denies GI upset at lower dose, diarrhea.  Hyperlipidemia Lab Results  Component Value Date   CHOL 213 (H) 03/27/2017   HDL 32 (L) 03/27/2017   LDLCALC 134 (H) 03/27/2017   TRIG 235 (H) 03/27/2017   CHOLHDL 6.7 (H) 03/27/2017   Lab Results  Component Value Date   ALT 33 03/27/2017   AST 16 03/27/2017   ALKPHOS 84 03/27/2017   BILITOT 0.3 03/27/2017  Uncontrolled 3 months ago but he restarted Lipitor in Sept. Still elevated on most recent check as above. Currently takes Lipitor 10 mg qd.   Hypercalcemia  Borderline at 10.3 at most recent labs.   L Arm Pain Pt reports intermittent L lower arm pain that he describes as a burning sensation, radiating into the L knuckles. States pain worsens as the day progresses after working on the computer for several hours. He has tried Tylenol once/day with some relief. Denies neck  pain.  Patient Active Problem List   Diagnosis Date Noted  . Diabetes mellitus type II 06/02/2011  . Hypertension 06/02/2011  . Hyperlipemia 06/02/2011  . Cataract 06/02/2011   Past Medical History:  Diagnosis Date  . Cataract   . Diabetes mellitus without complication ([place])   . Hyperlipidemia   . Hypertension    Past Surgical History:  Procedure Laterality Date  . HERNIA REPAIR    . NECK SURGERY    . SPINE SURGERY    . TRANSURETHRAL RESECTION OF PROSTATE    . TURP VAPORIZATION     No Known Allergies Prior to Admission medications   Medication Sig Start Date End Date Taking? Authorizing Provider  ARIPiprazole (ABILIFY) 2 MG tablet Take 2 mg by mouth daily.    [provider]  atorvastatin (LIPITOR) 10 MG tablet Take 1 tablet (10 mg total) by mouth daily at 6 PM. 12/22/16   [name], MD  Canagliflozin-Metformin HCl (INVOKAMET) 50-1000 MG TABS Take 1 tablet by mouth 2 (two) times daily. 01/03/17   [name], MD  citalopram (CELEXA) 40 MG tablet Take 40 mg by mouth daily.    [provider]  clonazePAM (KLONOPIN) 0.5 MG tablet Take 0.5 mg by mouth 3 (three) times daily as needed.    [provider]  fish oil-omega-3 fatty acids 1000 MG capsule  Take 2 g by mouth daily.    [provider]  ibuprofen (ADVIL,MOTRIN) 800 MG tablet Take 1 tablet (800 mg total) by mouth every 8 (eight) hours as needed for pain. 01/09/12   Shawnee Knapp, MD  lisinopril-hydrochlorothiazide (PRINZIDE,ZESTORETIC) 20-25 MG tablet Take 1 tablet by mouth daily. 12/22/16   [name], MD  Multiple Vitamin (MULTIVITAMIN) tablet Take 1 tablet by mouth daily.    [provider]   Social History   Socioeconomic History  . Marital status: Married    Spouse name: [name]  . Number of children: Not on file  . Years of education: Not on file  . Highest education level: Not on file  Social Needs  . Financial resource strain: Not on file  . Food  insecurity - worry: Not on file  . Food insecurity - inability: Not on file  . Transportation needs - medical: Not on file  . Transportation needs - non-medical: Not on file  Occupational History  . Not on file  Tobacco Use  . Smoking status: Former Research scientist (life sciences)  . Smokeless tobacco: Never Used  . Tobacco comment: quit 28 yrs ago  Substance and Sexual Activity  . Alcohol use: Yes    Comment: 1 glass wine per week  . Drug use: No  . Sexual activity: No  Other Topics Concern  . Not on file  Social History Narrative  . Not on file   Review of Systems  Gastrointestinal: Positive for abdominal pain (resolved). Negative for diarrhea.  Musculoskeletal: Positive for arthralgias and myalgias. Negative for neck pain.      Objective:   Physical Exam  Constitutional: He is oriented to person, place, and time. He appears well-developed and well-nourished. No distress.  HENT:  Head: Normocephalic and atraumatic.  Eyes: Conjunctivae and EOM are normal.  Neck: Neck supple. No tracheal deviation present.  Decreased extension. Slight decreased R rotation. Minimal lateral flexion.  Cardiovascular: Normal rate.  Pulmonary/Chest: Effort normal. No respiratory distress.  Musculoskeletal: Normal range of motion.  No tenderness across L arm or hand. Strength in UE normal.  Neurological: He is alert and oriented to person, place, and time.  Skin: Skin is warm and dry.  Psychiatric: He has a normal mood and affect. His behavior is normal.  Nursing note and vitals reviewed.  Vitals:   03/31/17 0932  BP: 130/82  Pulse: (!) 106  Resp: 18  Temp: 99.1 F (37.3 C)  TempSrc: Oral  SpO2: 98%  Weight: 196 lb 3.2 oz (89 kg)  Height: 6' 0.5" (1.842 m)      Assessment & Plan:   Andrew Tanner is a 66 y.o. male Type 2 diabetes mellitus with hyperglycemia, without long-term current use of insulin ([place]) - Plan: Canagliflozin-Metformin HCl (INVOKAMET) 50-500 MG TABS, glipiZIDE (GLUCOTROL) 5 MG  tablet  - Intolerant to higher dose of metformin, is currently tolerating 500 mg twice a day. Still uncontrolled. Discussed potential need for insulin.  - change to Invokamet 50/500 BID, add glipizide 5mg  QD. Hypoglycemic precautions.   - update by Mychart on readings in next month, recheck in 3 months.   Hyperlipidemia, unspecified hyperlipidemia type - Plan: atorvastatin (LIPITOR) 20 MG tablet  - Improved with still uncontrolled, and with history of diabetes will try to maximize statin dose. Increase Lipitor to 20 mg for now, recheck levels in 3 months  Cervical radiculopathy  - Suspected cervical radiculopathy with left arm/hand symptoms. Based on timing of symptoms, may be due to position  at work and movement of neck, with prior neck issues.   - Recommended range of motion, setting an alarm for frequent range of motion/gentle stretches throughout the day, Tylenol if needed, RTC precautions if not improving.  Hypercalcemia  - Borderline, recheck at future lab visit  Meds ordered this encounter  Medications  . atorvastatin (LIPITOR) 20 MG tablet    Sig: Take 1 tablet (20 mg total) by mouth daily at 6 PM.    Dispense:  90 tablet    Refill:  1  . Canagliflozin-Metformin HCl (INVOKAMET) 50-500 MG TABS    Sig: Take 50-500 mg by mouth 2 (two) times daily.    Dispense:  180 tablet    Refill:  1  . glipiZIDE (GLUCOTROL) 5 MG tablet    Sig: Take 1 tablet (5 mg total) by mouth daily before breakfast.    Dispense:  90 tablet    Refill:  1   Patient Instructions   Plan to repeat calcium with labs in future, as only borderline today.  Increase Lipitor to 20mg  per day.  Invokamet was changed to 50/500 - take this twice per day.  Start glipizide 5mg  with meal once per day and update me on Mychart with home readings in the next 4 weeks.   Arm symptoms sound like a possible pinched nerve coming from the neck. Try changing positions and gentle range of motion of neck throughout the workday to  see if that lessens frequency of symptoms. Tylenol as needed. Follow-up if the symptoms are not improving.  Return to the clinic or go to the nearest emergency room if any of your symptoms worsen or new symptoms occur.    IF you received an x-ray today, you will receive an invoice from [facility]. Please contact [facility] at [phone] with questions or concerns regarding your invoice.   IF you received labwork today, you will receive an invoice from [place]. Please contact [facility] at [phone] with questions or concerns regarding your invoice.   Our billing staff will not be able to assist you with questions regarding bills from these companies.  You will be contacted with the lab results as soon as they are available. The fastest way to get your results is to activate your My Chart account. Instructions are located on the last page of this paperwork. If you have not heard from [place] regarding the results in 2 weeks, please contact this office.      I personally performed the services described in this documentation, which was scribed in my presence. The recorded information has been reviewed and considered for accuracy and completeness, addended by me as needed, and agree with information above.  Signed,   [name], MD Primary Care at [place].  03/31/17 10:54 AM

## 2017-04-01 NOTE — Progress Notes (Signed)
Lab-only. Not seen by provider. 

## 2017-04-20 DIAGNOSIS — F3341 Major depressive disorder, recurrent, in partial remission: Secondary | ICD-10-CM | POA: Diagnosis not present

## 2017-05-05 ENCOUNTER — Encounter: Payer: Self-pay | Admitting: Family Medicine

## 2017-05-15 ENCOUNTER — Ambulatory Visit: Payer: Medicare Other | Admitting: Family Medicine

## 2017-05-16 ENCOUNTER — Ambulatory Visit: Payer: Medicare Other | Admitting: Family Medicine

## 2017-05-16 ENCOUNTER — Encounter: Payer: Self-pay | Admitting: Family Medicine

## 2017-05-16 ENCOUNTER — Other Ambulatory Visit: Payer: Self-pay

## 2017-05-16 VITALS — BP 122/72 | HR 85 | Temp 98.5°F | Resp 16 | Ht 72.44 in | Wt 204.0 lb

## 2017-05-16 DIAGNOSIS — Z1321 Encounter for screening for nutritional disorder: Secondary | ICD-10-CM

## 2017-05-16 DIAGNOSIS — M25522 Pain in left elbow: Secondary | ICD-10-CM | POA: Diagnosis not present

## 2017-05-16 DIAGNOSIS — E1165 Type 2 diabetes mellitus with hyperglycemia: Secondary | ICD-10-CM

## 2017-05-16 DIAGNOSIS — G629 Polyneuropathy, unspecified: Secondary | ICD-10-CM

## 2017-05-16 MED ORDER — GABAPENTIN 300 MG PO CAPS: 300.0000 mg | ORAL_CAPSULE | Freq: Every day | ORAL | 1 refills | Status: DC

## 2017-05-16 NOTE — Patient Instructions (Addendum)
  Pain and burning may be a neuropathy. I will check B12 level, thyroid and magnesium tests. Can try gabapentin initially 300mg  at night. If that is tolerated, but still having pain in the next week, can try increasing to twice per day.  Recheck in 1 month.   I'm glad to hear this blood sugars are improving on current medicines. No change in diabetes meds for now.  Return to the clinic or go to the nearest emergency room if any of your symptoms worsen or new symptoms occur.     IF you received an x-ray today, you will receive an invoice from [facility]. Please contact [facility] at [phone] with questions or concerns regarding your invoice.   IF you received labwork today, you will receive an invoice from [place]. Please contact [facility] at [phone] with questions or concerns regarding your invoice.   Our billing staff will not be able to assist you with questions regarding bills from these companies.  You will be contacted with the lab results as soon as they are available. The fastest way to get your results is to activate your My Chart account. Instructions are located on the last page of this paperwork. If you have not heard from [place] regarding the results in 2 weeks, please contact this office.

## 2017-05-16 NOTE — Progress Notes (Signed)
By signing my name below, I, Theresia Bough, attest that this documentation has been prepared under the direction and in the presence of Carlota Raspberry [name], MD.  Electronically Signed: Theresia Bough, Medical Scribe 05/16/17 at 3:39 PM   Patient was seen in room 2  Subjective:    Patient ID: Andrew Tanner, male    DOB: 18-Jan-1951, 67 y.o.   MRN: [id number] Chief Complaint  Patient presents with  . Nerve Pain    pt states he has been having some pain in his hands and legs for some months.     HPI Andrew Tanner is a 67 y.o. male who presents to Primary Care at [facility] complaining of hand an leg pain. He has a Hx of Type 2 DM. He was last seen in Mar 27, 2017. A1C was 9.5.  Did reports burning sensation in his left lower arm noted after working on the computer for several hours. He had tried Tylenol with some relief. Suspected cervical radiculopathy. We discussed stretches with ROM throughout the day. Email last week persistent neck situation with episodic spread to legs. We changed invokamet to 50/500 mg BID. He reports improved glucose reading on the regimen by email. I also added glipizide 5 mg QD.  Nerve pain in bilateral shins and left forearm/hand He reports intermittent pain in bilateral shins and left forearm that radiates across the dorsum of the hand. He notes that this is worse at night. Per pt, he is unaware if he has had an abnormal Vitamin B12 level in the fast. Pt denies any rash to the area and numbness in his feet and toes. Pt states he has never taken Neurontin in the past.   Neck pain He notes the pain is continued but has been improved when he is more active (moving every 15 minutes). He takes [phone] mg QD Tylenol and 400-500 mg QD Ibuprofen. He does not take them both daily. 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   DM He is doing well on 50/500 invokamet and  And 5 mg glipizide. He reports his glucose level has been at 135  regularly. He is pleased with this regimen.   Patient Active Problem List   Diagnosis Date Noted  . Diabetes mellitus type II 06/02/2011  . Hypertension 06/02/2011  . Hyperlipemia 06/02/2011  . Cataract 06/02/2011   Past Medical History:  Diagnosis Date  . Cataract   . Diabetes mellitus without complication ([place])   . Hyperlipidemia   . Hypertension    Past Surgical History:  Procedure Laterality Date  . HERNIA REPAIR    . NECK SURGERY    . SPINE SURGERY    . TRANSURETHRAL RESECTION OF PROSTATE    . TURP VAPORIZATION     No Known Allergies Prior to Admission medications   Medication Sig Start Date End Date Taking? Authorizing Provider  ARIPiprazole (ABILIFY) 2 MG tablet Take 2 mg by mouth daily.   Yes [provider]  atorvastatin (LIPITOR) 20 MG tablet Take 1 tablet (20 mg total) by mouth daily at 6 PM. 03/31/17  Yes [name], MD  Canagliflozin-Metformin HCl (INVOKAMET) 50-500 MG TABS Take 50-500 mg by mouth 2 (two) times daily. 03/31/17  Yes [name], MD  citalopram (CELEXA) 40 MG tablet Take 40 mg by mouth daily.   Yes [provider]  clonazePAM (KLONOPIN) 0.5 MG tablet Take 0.5 mg by mouth 3 (three) times daily as needed.  Yes [provider]  fish oil-omega-3 fatty acids 1000 MG capsule Take 2 g by mouth daily.   Yes [provider]  glipiZIDE (GLUCOTROL) 5 MG tablet Take 1 tablet (5 mg total) by mouth daily before breakfast. 03/31/17  Yes [name], MD  ibuprofen (ADVIL,MOTRIN) 800 MG tablet Take 1 tablet (800 mg total) by mouth every 8 (eight) hours as needed for pain. 01/09/12  Yes Shawnee Knapp, MD  lisinopril-hydrochlorothiazide (PRINZIDE,ZESTORETIC) 20-25 MG tablet Take 1 tablet by mouth daily. 12/22/16  Yes [name], MD  Multiple Vitamin (MULTIVITAMIN) tablet Take 1 tablet by mouth daily.   Yes [provider]  gabapentin (NEURONTIN) 300 MG capsule Take 1 capsule (300 mg total) by mouth at  bedtime. 05/16/17   [name], MD  metFORMIN (GLUCOPHAGE) 500 MG tablet Take 1 tablet (500 mg total) by mouth 2 (two) times daily with a meal. 12/22/16 01/03/17  [name], MD   Social History   Socioeconomic History  . Marital status: Married    Spouse name: Andrew Tanner  . Number of children: Not on file  . Years of education: Not on file  . Highest education level: Not on file  Social Needs  . Financial resource strain: Not on file  . Food insecurity - worry: Not on file  . Food insecurity - inability: Not on file  . Transportation needs - medical: Not on file  . Transportation needs - non-medical: Not on file  Occupational History  . Not on file  Tobacco Use  . Smoking status: Former Research scientist (life sciences)  . Smokeless tobacco: Never Used  . Tobacco comment: quit 28 yrs ago  Substance and Sexual Activity  . Alcohol use: Yes    Comment: 1 glass wine per week  . Drug use: No  . Sexual activity: No  Other Topics Concern  . Not on file  Social History Narrative  . Not on file     Review of Systems  Cardiovascular: Negative for leg swelling.  Musculoskeletal: Positive for neck pain.  Skin: Negative for rash.  Neurological: Negative for weakness and numbness (no numbness in the feet/toes).       + burning over the bilateral shins and left forearm and hand       Objective:   Physical Exam  Constitutional: He is oriented to person, place, and time. He appears well-developed and well-nourished. No distress.  HENT:  Head: Normocephalic and atraumatic.  Eyes: Conjunctivae and EOM are normal. Pupils are equal, round, and reactive to light.  Neck: Neck supple. No thyromegaly present.  Well healed scar on the posterior neck. No midline bony tenderness. Slight decreased rotation bilaterally. Slight discomfort with rotation that does not radiation to arms or legs.   Cardiovascular: Normal rate, regular rhythm and normal heart sounds.  Pulmonary/Chest: Effort normal and breath  sounds normal. No respiratory distress. He has no wheezes. He has no rales.  Musculoskeletal: He exhibits no edema or tenderness.  Negative seated straight leg raise bilaterally. Back ROM overall intact and does not cause leg symptoms.  Neurological: He is alert and oriented to person, place, and time.  Reflexes are 2 + at the biceps, triceps and brachioradialis, patella and achilles.  No appreciable weakness in the arms or arms.   Skin: He is not diaphoretic.  Psychiatric:  No rash. Slight burning sensation to the anterior lower leg on the left below the knee. No significant edema.     Vitals:   05/16/17  1444  BP: 122/72  Pulse: 85  Resp: 16  Temp: 98.5 F (36.9 C)  TempSrc: Oral  SpO2: 97%  Weight: 204 lb (92.5 kg)  Height: 6' 0.44" (1.84 m)         Assessment & Plan:   Andrew Tanner is a 67 y.o. male Peripheral polyneuropathy - Plan: TSH, Magnesium, Vitamin B12, gabapentin (NEURONTIN) 300 MG capsule  Arthralgia of left upper arm - Plan: Magnesium  Type 2 diabetes mellitus with hyperglycemia, without long-term current use of insulin (HCC)  Encounter for screening for nutritional disorder - Plan: Magnesium, Vitamin B12  History of diabetes, but recent improving numbers. Prior uncontrolled. Symptoms may be peripheral neuropathy, with cause of underlying diabetes or metabolic.   -Check Y64, magnesium, TSH.  -Start gabapentin 300 mg daily at bedtime, then if tolerated in 1 week and persistent symptoms, increase to twice a day. Potential side effects discussed.  -Recheck 1 month Meds ordered this encounter  Medications  . gabapentin (NEURONTIN) 300 MG capsule    Sig: Take 1 capsule (300 mg total) by mouth at bedtime.    Dispense:  30 capsule    Refill:  1   Patient Instructions    Pain and burning may be a neuropathy. I will check B12 level, thyroid and magnesium tests. Can try gabapentin initially 300mg  at night. If that is tolerated, but still having pain in the  next week, can try increasing to twice per day.  Recheck in 1 month.   I'm glad to hear this blood sugars are improving on current medicines. No change in diabetes meds for now.  Return to the clinic or go to the nearest emergency room if any of your symptoms worsen or new symptoms occur.     IF you received an x-ray today, you will receive an invoice from [facility]. Please contact [facility] at [phone] with questions or concerns regarding your invoice.   IF you received labwork today, you will receive an invoice from [place]. Please contact [facility] at [phone] with questions or concerns regarding your invoice.   Our billing staff will not be able to assist you with questions regarding bills from these companies.  You will be contacted with the lab results as soon as they are available. The fastest way to get your results is to activate your My Chart account. Instructions are located on the last page of this paperwork. If you have not heard from [place] regarding the results in 2 weeks, please contact this office.       I personally performed the services described in this documentation, which was scribed in my presence. The recorded information has been reviewed and considered for accuracy and completeness, addended by me as needed, and agree with information above.  Signed,   [name], MD Primary Care at [place].  05/17/17 3:28 PM

## 2017-05-17 LAB — MAGNESIUM: Magnesium: 2.6 mg/dL — ABNORMAL HIGH (ref 1.6–2.3)

## 2017-05-17 LAB — VITAMIN B12: Vitamin B-12: 398 pg/mL (ref 232–1245)

## 2017-05-17 LAB — TSH: TSH: 1.31 u[IU]/mL (ref 0.450–4.500)

## 2017-06-05 ENCOUNTER — Telehealth: Payer: Self-pay | Admitting: Family Medicine

## 2017-06-05 NOTE — Telephone Encounter (Signed)
Copied from [place] [id number]. Topic: Quick Communication - See Telephone Encounter >> Jun 05, 2017 10:13 AM [name] wrote: Relation to pt: self Call back number: [phone]   Reason for call:  Patient states due to the frequency changing from 1x daily to 2x daily, patient  completely out of gabapentin (NEURONTIN) 300 MG capsule, patient states pharmacy faxed in request requesting new Rx reflecting 2x daily. Patient requested Rx be sent in today, please advise  >> Jun 05, 2017 10:17 AM [name] wrote: Relation to pt: self Call back number: [phone]   Reason for call:   Patient last seen by Dr. Nyoka Cowden 05/16/17  Patient states due to the frequency changing from 1x daily to 2x daily, patient  completely out of gabapentin (NEURONTIN) 300 MG capsule.   patient states pharmacy faxed in request requesting new Rx reflecting 2x daily. Patient requestin Rx be sent in today, please advise  >> Jun 05, 2017  5:12 PM [name] wrote: Patient called back again.  He said he was told that this would be a priority.  Patient is out of the medication.

## 2017-06-05 NOTE — Telephone Encounter (Signed)
Pt stating that frequency of Gabapentin (Neurontin) 300 mg should be twice a day and current prescription is written for once daily. Pt states he is completely out of mediation.   LOV: 05/16/17 PCP: Dr. Carlota Raspberry Pharmacy: [facility] [address]

## 2017-06-05 NOTE — Telephone Encounter (Addendum)
Patient called back again.  He said he was told that this would be a priority.  Patient is out of the medication.  He would appreciate a call when the medication has been sent to the pharmacy.

## 2017-06-06 ENCOUNTER — Other Ambulatory Visit: Payer: Self-pay | Admitting: Family Medicine

## 2017-06-06 DIAGNOSIS — G629 Polyneuropathy, unspecified: Secondary | ICD-10-CM

## 2017-06-06 MED ORDER — GABAPENTIN 300 MG PO CAPS: 600.0000 mg | ORAL_CAPSULE | Freq: Every day | ORAL | 2 refills | Status: DC

## 2017-06-06 NOTE — Telephone Encounter (Signed)
Copied from [place] [id number]. Topic: Quick Communication - See Telephone Encounter >> Jun 05, 2017 10:13 AM [name] wrote: Relation to pt: self Call back number: [phone]   Reason for call:  Patient states due to the frequency changing from 1x daily to 2x daily, patient  completely out of gabapentin (NEURONTIN) 300 MG capsule, patient states pharmacy faxed in request requesting new Rx reflecting 2x daily. Patient requested Rx be sent in today, please advise  >> Jun 05, 2017 10:17 AM [name] wrote: Relation to pt: self Call back number: [phone]   Reason for call:   Patient last seen by Dr. Nyoka Cowden 05/16/17  Patient states due to the frequency changing from 1x daily to 2x daily, patient  completely out of gabapentin (NEURONTIN) 300 MG capsule.   patient states pharmacy faxed in request requesting new Rx reflecting 2x daily. Patient requestin Rx be sent in today, please advise  >> Jun 05, 2017  5:12 PM [name] wrote: Patient called back again.  He said he was told that this would be a priority.  Patient is out of the medication.

## 2017-06-06 NOTE — Progress Notes (Signed)
   Prescription for #60, 2 refills of gabapentin as he is out and taking 2 per night. Keep follow-up appointment as planned.

## 2017-06-15 DIAGNOSIS — I10 Essential (primary) hypertension: Secondary | ICD-10-CM | POA: Diagnosis not present

## 2017-06-15 DIAGNOSIS — G959 Disease of spinal cord, unspecified: Secondary | ICD-10-CM | POA: Diagnosis not present

## 2017-06-15 DIAGNOSIS — M4714 Other spondylosis with myelopathy, thoracic region: Secondary | ICD-10-CM | POA: Diagnosis not present

## 2017-06-15 DIAGNOSIS — Z6827 Body mass index (BMI) 27.0-27.9, adult: Secondary | ICD-10-CM | POA: Diagnosis not present

## 2017-06-22 ENCOUNTER — Ambulatory Visit: Payer: Medicare Other | Admitting: Family Medicine

## 2017-06-22 ENCOUNTER — Encounter: Payer: Self-pay | Admitting: Family Medicine

## 2017-06-22 ENCOUNTER — Other Ambulatory Visit: Payer: Self-pay

## 2017-06-22 VITALS — BP 102/68 | HR 86 | Temp 98.0°F | Resp 16 | Ht 72.0 in | Wt 204.0 lb

## 2017-06-22 DIAGNOSIS — Z1159 Encounter for screening for other viral diseases: Secondary | ICD-10-CM

## 2017-06-22 DIAGNOSIS — E1165 Type 2 diabetes mellitus with hyperglycemia: Secondary | ICD-10-CM

## 2017-06-22 DIAGNOSIS — G9589 Other specified diseases of spinal cord: Secondary | ICD-10-CM | POA: Diagnosis not present

## 2017-06-22 DIAGNOSIS — Z1211 Encounter for screening for malignant neoplasm of colon: Secondary | ICD-10-CM

## 2017-06-22 DIAGNOSIS — G629 Polyneuropathy, unspecified: Secondary | ICD-10-CM | POA: Diagnosis not present

## 2017-06-22 MED ORDER — CANAGLIFLOZIN-METFORMIN HCL 50-500 MG PO TABS: 50.0000 mg | ORAL_TABLET | Freq: Two times a day (BID) | ORAL | 1 refills | Status: DC

## 2017-06-22 MED ORDER — GLIPIZIDE 5 MG PO TABS: 5.0000 mg | ORAL_TABLET | Freq: Every day | ORAL | 1 refills | Status: DC

## 2017-06-22 MED ORDER — GABAPENTIN 300 MG PO CAPS: ORAL_CAPSULE | ORAL | 3 refills | Status: DC

## 2017-06-22 NOTE — Progress Notes (Signed)
Subjective:  By signing my name below, I, [name], attest that this documentation has been prepared under the direction and in the presence of [name], MD. Electronically Signed: [name], [place]. 06/22/2017 , 3:17 PM .  Patient was seen in Room 10 .   Patient ID: Andrew Tanner, male    DOB: [birth year], 67 y.o.   MRN: [id number] Chief Complaint  Patient presents with  . Peripheral polyneuropathy    follow-up from last OV on 2/5, pt states the Gabapentin is helping some but my need to increase the dose.    HPI Andrew Tanner is a 67 y.o. male Here for follow up of peripheral neuropathy. He isn't fasting today.   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 He did have some episodic symptoms in his legs. Initially suspected cervical radiculopathy, but also with diabetes, possible peripheral neuropathy. He was started on gabapentin 300mg  QHS, with option of increasing to BID. He had a normal B-12, borderline elevated magnesium, and normal TSH.   Patient has been taking 600mg  (2 tablets) QHS. He denies pain during the night. He describes pain returning at around 12:00PM-1:00PM the next day. Dr. Ellene Route notes patient can up his dose on gabapentin. He denies any lightheadedness or dizziness with gabapentin.   Patient saw his neurosurgeon, Dr. Ellene Route, on Mar 7th (1 week ago). Given his paraesthesia symptoms, plan for MRI of his spine to evaluate for myelomalacia. Patient has MRI scheduled.   Diabetes A1C improved from 12.2 to 9.5 in Dec. His home readings were improving at last visit around 130s and was also taking glipizide 5mg  QD with invokamet 50/500mg  BID. Urine microalbumin at 21.8 in Sept 2018. He checks his sugars occasionally, checked last week was in the 130s.   Optho: plans to have this scheduled.  Foot exam: Sept 2018 Pneumovax: Nov 2018  Health maintenance He is due for  colonoscopy and Hep C screening.   Hep C screening: He agrees to Hep C screening.  Colonoscopy: agrees for referral, though he had some phobia of this in the past. Colo-guard also discussed as option.   Patient Active Problem List   Diagnosis Date Noted  . Diabetes mellitus type II 06/02/2011  . Hypertension 06/02/2011  . Hyperlipemia 06/02/2011  . Cataract 06/02/2011   Past Medical History:  Diagnosis Date  . Cataract   . Diabetes mellitus without complication ([place])   . Hyperlipidemia   . Hypertension    Past Surgical History:  Procedure Laterality Date  . HERNIA REPAIR    . NECK SURGERY    . SPINE SURGERY    . TRANSURETHRAL RESECTION OF PROSTATE    . TURP VAPORIZATION     No Known Allergies Prior to Admission medications   Medication Sig Start Date End Date Taking? Authorizing Provider  ARIPiprazole (ABILIFY) 2 MG tablet Take 2 mg by mouth daily.    [provider]  atorvastatin (LIPITOR) 20 MG tablet Take 1 tablet (20 mg total) by mouth daily at 6 PM. 03/31/17   [name], MD  Canagliflozin-Metformin HCl (INVOKAMET) 50-500 MG TABS Take 50-500 mg by mouth 2 (two) times daily. 03/31/17   [name], MD  citalopram (CELEXA) 40 MG tablet Take 40 mg by mouth daily.    [provider]  clonazePAM (KLONOPIN) 0.5 MG tablet Take 0.5 mg by mouth 3 (three) times daily as needed.    [provider]  fish  oil-omega-3 fatty acids 1000 MG capsule Take 2 g by mouth daily.    [provider]  gabapentin (NEURONTIN) 300 MG capsule Take 2 capsules (600 mg total) by mouth at bedtime. 06/06/17   [name], MD  glipiZIDE (GLUCOTROL) 5 MG tablet Take 1 tablet (5 mg total) by mouth daily before breakfast. 03/31/17   [name], MD  ibuprofen (ADVIL,MOTRIN) 800 MG tablet Take 1 tablet (800 mg total) by mouth every 8 (eight) hours as needed for pain. 01/09/12   Shawnee Knapp, MD  lisinopril-hydrochlorothiazide (PRINZIDE,ZESTORETIC) 20-25  MG tablet Take 1 tablet by mouth daily. 12/22/16   [name], MD  Multiple Vitamin (MULTIVITAMIN) tablet Take 1 tablet by mouth daily.    [provider]  metFORMIN (GLUCOPHAGE) 500 MG tablet Take 1 tablet (500 mg total) by mouth 2 (two) times daily with a meal. 12/22/16 01/03/17  [name], MD   Social History   Socioeconomic History  . Marital status: Married    Spouse name: [name]  . Number of children: Not on file  . Years of education: Not on file  . Highest education level: Not on file  Social Needs  . Financial resource strain: Not on file  . Food insecurity - worry: Not on file  . Food insecurity - inability: Not on file  . Transportation needs - medical: Not on file  . Transportation needs - non-medical: Not on file  Occupational History  . Not on file  Tobacco Use  . Smoking status: Former Research scientist (life sciences)  . Smokeless tobacco: Never Used  . Tobacco comment: quit 28 yrs ago  Substance and Sexual Activity  . Alcohol use: Yes    Comment: 1 glass wine per week  . Drug use: No  . Sexual activity: No  Other Topics Concern  . Not on file  Social History Narrative  . Not on file   Review of Systems  Constitutional: Negative for fatigue and unexpected weight change.  Eyes: Negative for visual disturbance.  Respiratory: Negative for cough, chest tightness and shortness of breath.   Cardiovascular: Negative for chest pain, palpitations and leg swelling.  Gastrointestinal: Negative for abdominal pain and blood in stool.  Neurological: Negative for dizziness, light-headedness and headaches.       Objective:   Physical Exam  Constitutional: He is oriented to person, place, and time. He appears well-developed and well-nourished.  HENT:  Head: Normocephalic and atraumatic.  Eyes: EOM are normal. Pupils are equal, round, and reactive to light.  Neck: No JVD present. Carotid bruit is not present.  Cardiovascular: Normal rate, regular rhythm and normal  heart sounds.  No murmur heard. Pulmonary/Chest: Effort normal and breath sounds normal. He has no rales.  Musculoskeletal: He exhibits no edema.  Neurological: He is alert and oriented to person, place, and time.  Sensation intact in finger and toes, even through the shoes; cap refill <1 second in finger tips  Skin: Skin is warm and dry.  Psychiatric: He has a normal mood and affect.  Vitals reviewed.   Vitals:   06/22/17 1428  BP: 102/68  Pulse: 86  Resp: 16  Temp: 98 F (36.7 C)  TempSrc: Oral  SpO2: 98%  Weight: 204 lb (92.5 kg)  Height: 6' (1.829 m)       Assessment & Plan:   Yama Nielson is a 67 y.o. male Type 2 diabetes mellitus with hyperglycemia, without long-term current use of insulin ([place]) - Plan: glipiZIDE (GLUCOTROL) 5  MG tablet, Canagliflozin-metFORMIN HCl (INVOKAMET) 50-500 MG TABS, Hemoglobin A1c  - improving in December. Home readings have also improved into the lower 100s.   -continue glipizide, INvokamet at same dose for now, check A1c  -he plans to schedule ophtho visit  Peripheral polyneuropathy - Plan: gabapentin (NEURONTIN) 300 MG capsule Myelomalacia ([place])  -Note from neurosurgery reviewed with plan for MRI of cervical and thoracic spine to see if any progression of myelomalacia which may be contributing to symptoms.   -still may be diabetic neuropathy, as improved with gabapentin. Add additional dose in the morning to control daytime symptoms. continue 600 mg QHS. tside effects discussed.  Encounter for hepatitis C screening test for low risk patient - Plan: Hepatitis C antibody  Screen for colon cancer - Plan: Ambulatory referral to Gastroenterology  - option of Cologuard discussed if he has phobia with colonoscopy  Meds ordered this encounter  Medications  . glipiZIDE (GLUCOTROL) 5 MG tablet    Sig: Take 1 tablet (5 mg total) by mouth daily before breakfast.    Dispense:  90 tablet    Refill:  1  . Canagliflozin-metFORMIN HCl  (INVOKAMET) 50-500 MG TABS    Sig: Take 50-500 mg by mouth 2 (two) times daily.    Dispense:  180 tablet    Refill:  1  . gabapentin (NEURONTIN) 300 MG capsule    Sig: 2 cap po QHS, 1 po QAM.    Dispense:  90 capsule    Refill:  3   Patient Instructions   Schedule appointment with eye doctor.  I referred you for colonoscopy, but if you feel you are unable to have that done - Cologuard may be an option.   Continue gabapentin twice a night, additional dose in the morning. You can try just 1 pill at night to see if that will provide similar results. Let me know if there are questions.  No change in diabetes medications for now, but will let you know once I have your A1c results   IF you received an x-ray today, you will receive an invoice from [facility] Radiology. Please contact [facility] Radiology at [phone] with questions or concerns regarding your invoice.   IF you received labwork today, you will receive an invoice from [place]. Please contact [facility] at [phone] with questions or concerns regarding your invoice.   Our billing staff will not be able to assist you with questions regarding bills from these companies.  You will be contacted with the lab results as soon as they are available. The fastest way to get your results is to activate your My Chart account. Instructions are located on the last page of this paperwork. If you have not heard from [place] regarding the results in 2 weeks, please contact this office.       I personally performed the services described in this documentation, which was scribed in my presence. The recorded information has been reviewed and considered for accuracy and completeness, addended by me as needed, and agree with information above.  Signed,   [name], MD Primary Care at [place].  06/22/17 3:32 PM

## 2017-06-22 NOTE — Patient Instructions (Addendum)
Schedule appointment with eye doctor.  I referred you for colonoscopy, but if you feel you are unable to have that done - Cologuard may be an option.   Continue gabapentin twice a night, additional dose in the morning. You can try just 1 pill at night to see if that will provide similar results. Let me know if there are questions.  No change in diabetes medications for now, but will let you know once I have your A1c results   IF you received an x-ray today, you will receive an invoice from [facility]. Please contact [facility] Radiology at [phone] with questions or concerns regarding your invoice.   IF you received labwork today, you will receive an invoice from [place]. Please contact [facility] at [phone] with questions or concerns regarding your invoice.   Our billing staff will not be able to assist you with questions regarding bills from these companies.  You will be contacted with the lab results as soon as they are available. The fastest way to get your results is to activate your My Chart account. Instructions are located on the last page of this paperwork. If you have not heard from [place] regarding the results in 2 weeks, please contact this office.

## 2017-06-23 LAB — HEMOGLOBIN A1C
ESTIMATED AVERAGE GLUCOSE: 203 mg/dL
HEMOGLOBIN A1C: 8.7 % — AB (ref 4.8–5.6)

## 2017-06-28 DIAGNOSIS — M4714 Other spondylosis with myelopathy, thoracic region: Secondary | ICD-10-CM | POA: Diagnosis not present

## 2017-06-28 DIAGNOSIS — M542 Cervicalgia: Secondary | ICD-10-CM | POA: Diagnosis not present

## 2017-06-28 DIAGNOSIS — G959 Disease of spinal cord, unspecified: Secondary | ICD-10-CM | POA: Diagnosis not present

## 2017-06-28 DIAGNOSIS — M5124 Other intervertebral disc displacement, thoracic region: Secondary | ICD-10-CM | POA: Diagnosis not present

## 2017-06-28 DIAGNOSIS — M47814 Spondylosis without myelopathy or radiculopathy, thoracic region: Secondary | ICD-10-CM | POA: Diagnosis not present

## 2017-06-29 DIAGNOSIS — Z6827 Body mass index (BMI) 27.0-27.9, adult: Secondary | ICD-10-CM | POA: Diagnosis not present

## 2017-06-29 DIAGNOSIS — G959 Disease of spinal cord, unspecified: Secondary | ICD-10-CM | POA: Diagnosis not present

## 2017-07-03 ENCOUNTER — Other Ambulatory Visit: Payer: Self-pay | Admitting: Family Medicine

## 2017-07-03 DIAGNOSIS — E1165 Type 2 diabetes mellitus with hyperglycemia: Secondary | ICD-10-CM

## 2017-07-03 MED ORDER — GLIPIZIDE 5 MG PO TABS: 5.0000 mg | ORAL_TABLET | Freq: Two times a day (BID) | ORAL | 1 refills | Status: DC

## 2017-07-03 NOTE — Progress Notes (Signed)
See lab notes

## 2017-07-06 ENCOUNTER — Telehealth: Payer: Self-pay | Admitting: Family Medicine

## 2017-07-06 NOTE — Telephone Encounter (Signed)
Copied from [place]. Topic: Quick Communication - See Telephone Encounter >> Jul 06, 2017 10:17 AM Ahmed Prima L wrote: CRM for notification. See Telephone encounter for: 07/06/17. [employer] will be faxing over a form for a medication and questions and if it is not sent back today it will be denied. Please be on the lookout. Thank you! Call back number [phone] option 5 after you dial the number

## 2017-07-07 ENCOUNTER — Telehealth: Payer: Self-pay

## 2017-07-07 ENCOUNTER — Encounter: Payer: Self-pay | Admitting: Family Medicine

## 2017-07-07 NOTE — Telephone Encounter (Signed)
Medication denied by [facility], need a formulary alternative

## 2017-07-07 NOTE — Telephone Encounter (Signed)
BC called for clinical questions regarding PA for Invokamet  Will return call/fax today with decision.

## 2017-07-08 NOTE — Telephone Encounter (Signed)
Message to Dr. Carlota Raspberry - Invokamet denied following PA written & discussed with [employer] Needs alternative

## 2017-07-09 ENCOUNTER — Other Ambulatory Visit: Payer: Self-pay | Admitting: Family Medicine

## 2017-07-09 MED ORDER — DAPAGLIFLOZIN PRO-METFORMIN ER 5-1000 MG PO TB24: 5.0000 mg | ORAL_TABLET | Freq: Every day | ORAL | 2 refills | Status: DC

## 2017-07-09 NOTE — Addendum Note (Signed)
Addended by: [name] R on: 07/09/2017 01:32 PM   Modules accepted: Orders

## 2017-07-09 NOTE — Telephone Encounter (Signed)
Invokamet discontinued, try Xigduo. Message sent to patient by MyChart

## 2017-07-14 ENCOUNTER — Other Ambulatory Visit: Payer: Self-pay | Admitting: Family Medicine

## 2017-07-14 DIAGNOSIS — I10 Essential (primary) hypertension: Secondary | ICD-10-CM

## 2017-07-17 ENCOUNTER — Encounter: Payer: Self-pay | Admitting: Family Medicine

## 2017-07-19 ENCOUNTER — Telehealth: Payer: Self-pay | Admitting: Family Medicine

## 2017-07-19 NOTE — Telephone Encounter (Signed)
Copied from [place] [id number]. Topic: Quick Communication - See Telephone Encounter >> Jul 19, 2017  2:13 PM Arletha Grippe wrote: CRM for notification. See Telephone encounter for: 07/19/17. Rhodena B.  from [place] is calling for prior auth for xigduo 5-100mg  xr please expidite this, pt has not had meds in a while  Cb [phone]

## 2017-07-19 NOTE — Telephone Encounter (Signed)
Prior Auth

## 2017-07-20 ENCOUNTER — Telehealth: Payer: Self-pay

## 2017-07-20 NOTE — Telephone Encounter (Signed)
Received Denial letter from [facility] for Xigduo XR.    Other alternatives given was Metformin and Jardiance.   Denial letter placed In your box.  Thanks, Lucillie Garfinkel, [place]

## 2017-07-21 MED ORDER — EMPAGLIFLOZIN 10 MG PO TABS: 10.0000 mg | ORAL_TABLET | Freq: Every day | ORAL | 1 refills | Status: DC

## 2017-07-21 MED ORDER — METFORMIN HCL 500 MG PO TABS: 500.0000 mg | ORAL_TABLET | Freq: Two times a day (BID) | ORAL | 1 refills | Status: DC

## 2017-07-21 NOTE — Telephone Encounter (Signed)
See my chart email, change from Xigduo to Jardiance 10 mg daily, metformin 500 mg twice daily.

## 2017-07-25 ENCOUNTER — Telehealth: Payer: Self-pay | Admitting: Family Medicine

## 2017-07-25 DIAGNOSIS — E1165 Type 2 diabetes mellitus with hyperglycemia: Secondary | ICD-10-CM

## 2017-07-25 NOTE — Telephone Encounter (Signed)
Copied from [place] [id number]. Topic: Quick Communication - See Telephone Encounter >> Jul 25, 2017  3:53 PM Ether Griffins B wrote: CRM for notification. See Telephone encounter for: 07/25/17.  Pt states [facility], [place] - [address] Has sent over two request for a refill on his test strips for his one touch ultra 2. Pt is almost out of test strips.

## 2017-07-26 MED ORDER — GLUCOSE BLOOD VI STRP: 1.0000 | ORAL_STRIP | Freq: Every day | 3 refills | Status: DC

## 2017-07-26 NOTE — Telephone Encounter (Signed)
Phone call to patient. Per signed authorization to leave detailed message, left voicemail stating RN needs more information regarding request for blood test strips. How often is he testing? Please call back to let us know and RN will send in test strips.

## 2017-07-26 NOTE — Telephone Encounter (Signed)
Incoming call transferred from [place]. Patient states he is testing once daily. Pharmacy confirmed. Test strips sent to pharmacy.

## 2017-07-28 ENCOUNTER — Other Ambulatory Visit: Payer: Self-pay

## 2017-07-28 ENCOUNTER — Ambulatory Visit: Payer: Medicare Other | Admitting: Family Medicine

## 2017-07-28 ENCOUNTER — Encounter: Payer: Self-pay | Admitting: Family Medicine

## 2017-07-28 VITALS — BP 132/72 | HR 87 | Temp 98.0°F | Resp 16 | Ht 72.44 in | Wt 198.0 lb

## 2017-07-28 DIAGNOSIS — E1165 Type 2 diabetes mellitus with hyperglycemia: Secondary | ICD-10-CM

## 2017-07-28 DIAGNOSIS — E1142 Type 2 diabetes mellitus with diabetic polyneuropathy: Secondary | ICD-10-CM

## 2017-07-28 LAB — GLUCOSE, POCT (MANUAL RESULT ENTRY): POC GLUCOSE: 169 mg/dL — AB (ref 70–99)

## 2017-07-28 NOTE — Patient Instructions (Addendum)
  Episode of dizziness may have been related to the hot bath and higher dose of gabapentin.  Ok to take 1 at each dosing for now.  Return to the clinic or go to the nearest emergency room if any of your symptoms worsen or new symptoms occur.   OK to continue same meds for now, but if blood sugar remins over 150 fasting in next few weeks, could change metformin to 850mg  dose. Continue to watch diet and exercise/activity. Follow up for repeat A1c in June.     IF you received an x-ray today, you will receive an invoice from [facility] Radiology. Please contact [facility] Radiology at [phone] with questions or concerns regarding your invoice.   IF you received labwork today, you will receive an invoice from [place]. Please contact [facility] at [phone] with questions or concerns regarding your invoice.   Our billing staff will not be able to assist you with questions regarding bills from these companies.  You will be contacted with the lab results as soon as they are available. The fastest way to get your results is to activate your My Chart account. Instructions are located on the last page of this paperwork. If you have not heard from [place] regarding the results in 2 weeks, please contact this office.

## 2017-07-28 NOTE — Progress Notes (Signed)
Subjective:  By signing my name below, I, [name], attest that this documentation has been prepared under the direction and in the presence of [name], MD Electronically Signed: Ladene Artist, ED Scribe 07/28/2017 at 8:32 AM.   Patient ID: Andrew Tanner, male    DOB: 05/30/1950, 67 y.o.   MRN: [id number]  Chief Complaint  Patient presents with  . Diabetes    pt states he stated a new medication Jardiance last OV and states the medication has been keeping blood sugar down   . Medication Reaction    pt states he had a reaction to the Neurontin and now take 2 not 3, pt states when he took 3 tabs be had some diziness and muscle pain.    HPI Andrew Tanner is a 67 y.o. male who presents to Primary Care at [facility] for f/u of DM and peripheral neuropathy.  DM See previous emails. Due to coverage he was changed to Jardiance 10 mg qd. Glipizide 5 mg bid and Metformin 500 mg bid. - Pt has been on Jardiance for ~1 wk. Pt reports initial blood glucose was averaging 270s on Invokana, now in 150s on Jardiance. Pt did report GI upset at Metformin 1000 mg.  Wt Readings from Last 3 Encounters:  07/28/17 198 lb (89.8 kg)  06/22/17 204 lb (92.5 kg)  05/16/17 204 lb (92.5 kg)   Peripheral Neuropathy Gabapentin, options discussed for dosing last visit. - Pt states 1 wk ago he was trying to shave after taking a warm bath when he noticed flushing sensation prior to falling. States he had taken 2 gabapentin tabs at night and 1 mid day which he suspects caused the incident. Reports he returned to baseline ~5 mins later and was able to go to breakfast. He is now taking 1 tab at night and 1 mid day which he states is controlling symptoms. Denies focal weakness, cp, blood in stools, melena.  Patient Active Problem List   Diagnosis Date Noted  . Diabetes mellitus type II 06/02/2011  . Hypertension 06/02/2011  . Hyperlipemia 06/02/2011  . Cataract 06/02/2011   Past Medical History:    Diagnosis Date  . Cataract   . Diabetes mellitus without complication ([place])   . Hyperlipidemia   . Hypertension    Past Surgical History:  Procedure Laterality Date  . HERNIA REPAIR    . NECK SURGERY    . SPINE SURGERY    . TRANSURETHRAL RESECTION OF PROSTATE    . TURP VAPORIZATION     No Known Allergies Prior to Admission medications   Medication Sig Start Date End Date Taking? Authorizing Provider  ARIPiprazole (ABILIFY) 2 MG tablet Take 2 mg by mouth daily.    [provider]  atorvastatin (LIPITOR) 20 MG tablet Take 1 tablet (20 mg total) by mouth daily at 6 PM. 03/31/17   [name], MD  citalopram (CELEXA) 40 MG tablet Take 40 mg by mouth daily.    [provider]  clonazePAM (KLONOPIN) 0.5 MG tablet Take 0.5 mg by mouth 3 (three) times daily as needed.    [provider]  empagliflozin (JARDIANCE) 10 MG TABS tablet Take 10 mg by mouth daily. 07/21/17   [name], MD  gabapentin (NEURONTIN) 300 MG capsule 2 cap po QHS, 1 po QAM. 06/22/17   [name], MD  glipiZIDE (GLUCOTROL) 5 MG tablet Take 1 tablet (5 mg total) by mouth 2 (two) times daily before a meal. 07/03/17   Carlota Raspberry,  [name], MD  glucose blood test strip 1 each by Other route daily. Use as instructed 07/26/17   [name], MD  ibuprofen (ADVIL,MOTRIN) 800 MG tablet Take 1 tablet (800 mg total) by mouth every 8 (eight) hours as needed for pain. 01/09/12   Shawnee Knapp, MD  lisinopril-hydrochlorothiazide (PRINZIDE,ZESTORETIC) 20-25 MG tablet TAKE 1 TABLET BY MOUTH ONCE DAILY 07/14/17   [name], MD  metFORMIN (GLUCOPHAGE) 500 MG tablet Take 1 tablet (500 mg total) by mouth 2 (two) times daily with a meal. 07/21/17   [name], MD  Multiple Vitamin (MULTIVITAMIN) tablet Take 1 tablet by mouth daily.    [provider]   Social History   Socioeconomic History  . Marital status: Married    Spouse name: Andrew Tanner  . Number of children: Not  on file  . Years of education: Not on file  . Highest education level: Not on file  Occupational History  . Not on file  Social Needs  . Financial resource strain: Not on file  . Food insecurity:    Worry: Not on file    Inability: Not on file  . Transportation needs:    Medical: Not on file    Non-medical: Not on file  Tobacco Use  . Smoking status: Former Research scientist (life sciences)  . Smokeless tobacco: Never Used  . Tobacco comment: quit 28 yrs ago  Substance and Sexual Activity  . Alcohol use: Yes    Comment: 1 glass wine per week  . Drug use: No  . Sexual activity: Never  Lifestyle  . Physical activity:    Days per week: Not on file    Minutes per session: Not on file  . Stress: Not on file  Relationships  . Social connections:    Talks on phone: Not on file    Gets together: Not on file    Attends religious service: Not on file    Active member of club or organization: Not on file    Attends meetings of clubs or organizations: Not on file    Relationship status: Not on file  . Intimate partner violence:    Fear of current or ex partner: Not on file    Emotionally abused: Not on file    Physically abused: Not on file    Forced sexual activity: Not on file  Other Topics Concern  . Not on file  Social History Narrative  . Not on file   Review of Systems  Constitutional: Negative for fatigue and unexpected weight change.  Eyes: Negative for visual disturbance.  Respiratory: Negative for cough, chest tightness and shortness of breath.   Cardiovascular: Negative for chest pain, palpitations and leg swelling.  Gastrointestinal: Negative for abdominal pain and blood in stool.  Neurological: Negative for dizziness, weakness, light-headedness and headaches.      Objective:   Physical Exam  Constitutional: He is oriented to person, place, and time. He appears well-developed and well-nourished.  HENT:  Head: Normocephalic and atraumatic.  Eyes: Pupils are equal, round, and reactive  to light. EOM are normal.  Neck: No JVD present. Carotid bruit is not present.  Cardiovascular: Normal rate, regular rhythm and normal heart sounds.  No murmur heard. Pulmonary/Chest: Effort normal and breath sounds normal. He has no rales.  Musculoskeletal: He exhibits no edema.  Neurological: He is alert and oriented to person, place, and time.  Skin: Skin is warm and dry.  Psychiatric: He has a normal mood and affect.  Vitals reviewed.  Vitals:   07/28/17 0808  BP: 132/72  Pulse: 87  Resp: 16  Temp: 98 F (36.7 C)  TempSrc: Oral  SpO2: 99%  Weight: 198 lb (89.8 kg)  Height: 6' 0.44" (1.84 m)   Results for orders placed or performed in visit on 07/28/17  POCT glucose (manual entry)  Result Value Ref Range   POC Glucose 169 (A) 70 - 99 mg/dl      Assessment & Plan:  Andrew Tanner is a 67 y.o. male Type 2 diabetes mellitus with hyperglycemia, without long-term current use of insulin ([place]) - Plan: POCT glucose (manual entry)  Diabetic polyneuropathy associated with type 2 diabetes mellitus ([place])  Possible vasovagal symptoms after hot bath.  Okay to continue gabapentin 1 tablet at each dosing for now.  We will also continue same diabetic regimen for now, but if fastings remain over 150 could consider increasing metformin to 850 mg to see if that will be tolerated.  Did not tolerate 1000 mg.  Plan for repeat visit in June  No orders of the defined types were placed in this encounter.  Patient Instructions    Episode of dizziness may have been related to the hot bath and higher dose of gabapentin.  Ok to take 1 at each dosing for now.  Return to the clinic or go to the nearest emergency room if any of your symptoms worsen or new symptoms occur.   OK to continue same meds for now, but if blood sugar remins over 150 fasting in next few weeks, could change metformin to 850mg  dose. Continue to watch diet and exercise/activity. Follow up for repeat A1c in June.     IF you  received an x-ray today, you will receive an invoice from [facility] Radiology. Please contact [facility] Radiology at [phone] with questions or concerns regarding your invoice.   IF you received labwork today, you will receive an invoice from [place]. Please contact [facility] at [phone] with questions or concerns regarding your invoice.   Our billing staff will not be able to assist you with questions regarding bills from these companies.  You will be contacted with the lab results as soon as they are available. The fastest way to get your results is to activate your My Chart account. Instructions are located on the last page of this paperwork. If you have not heard from [place] regarding the results in 2 weeks, please contact this office.       I personally performed the services described in this documentation, which was scribed in my presence. The recorded information has been reviewed and considered for accuracy and completeness, addended by me as needed, and agree with information above.  Signed,   [name], MD Primary Care at [place].  07/29/17 10:35 PM

## 2017-07-29 ENCOUNTER — Encounter: Payer: Self-pay | Admitting: Family Medicine

## 2017-09-18 DIAGNOSIS — F3341 Major depressive disorder, recurrent, in partial remission: Secondary | ICD-10-CM | POA: Diagnosis not present

## 2017-10-02 ENCOUNTER — Encounter: Payer: Self-pay | Admitting: Family Medicine

## 2017-10-02 ENCOUNTER — Ambulatory Visit: Payer: Medicare Other | Admitting: Family Medicine

## 2017-10-02 ENCOUNTER — Other Ambulatory Visit: Payer: Self-pay

## 2017-10-02 DIAGNOSIS — I10 Essential (primary) hypertension: Secondary | ICD-10-CM

## 2017-10-02 DIAGNOSIS — E1165 Type 2 diabetes mellitus with hyperglycemia: Secondary | ICD-10-CM

## 2017-10-02 DIAGNOSIS — E785 Hyperlipidemia, unspecified: Secondary | ICD-10-CM

## 2017-10-02 DIAGNOSIS — G629 Polyneuropathy, unspecified: Secondary | ICD-10-CM | POA: Diagnosis not present

## 2017-10-02 LAB — COMPREHENSIVE METABOLIC PANEL
A/G RATIO: 2.4 — AB (ref 1.2–2.2)
ALBUMIN: 4.4 g/dL (ref 3.6–4.8)
ALT: 29 IU/L (ref 0–44)
AST: 22 IU/L (ref 0–40)
Alkaline Phosphatase: 69 IU/L (ref 39–117)
BILIRUBIN TOTAL: 0.2 mg/dL (ref 0.0–1.2)
BUN/Creatinine Ratio: 27 — ABNORMAL HIGH (ref 10–24)
BUN: 26 mg/dL (ref 8–27)
CHLORIDE: 101 mmol/L (ref 96–106)
CO2: 22 mmol/L (ref 20–29)
Calcium: 9.9 mg/dL (ref 8.6–10.2)
Creatinine, Ser: 0.97 mg/dL (ref 0.76–1.27)
GFR calc non Af Amer: 80 mL/min/{1.73_m2} (ref >59)
GFR, EST AFRICAN AMERICAN: 92 mL/min/{1.73_m2} (ref >59)
GLUCOSE: 161 mg/dL — AB (ref 65–99)
Globulin, Total: 1.8 g/dL (ref 1.5–4.5)
Potassium: 4.7 mmol/L (ref 3.5–5.2)
Sodium: 139 mmol/L (ref 134–144)
TOTAL PROTEIN: 6.2 g/dL (ref 6.0–8.5)

## 2017-10-02 LAB — LIPID PANEL
CHOLESTEROL TOTAL: 167 mg/dL (ref 100–199)
Chol/HDL Ratio: 4.2 ratio (ref 0.0–5.0)
HDL: 40 mg/dL (ref >39)
LDL Calculated: 105 mg/dL — ABNORMAL HIGH (ref 0–99)
TRIGLYCERIDES: 111 mg/dL (ref 0–149)
VLDL Cholesterol Cal: 22 mg/dL (ref 5–40)

## 2017-10-02 LAB — HEMOGLOBIN A1C
Est. average glucose Bld gHb Est-mCnc: 151 mg/dL
HEMOGLOBIN A1C: 6.9 % — AB (ref 4.8–5.6)

## 2017-10-02 MED ORDER — GABAPENTIN 300 MG PO CAPS: 300.0000 mg | ORAL_CAPSULE | Freq: Two times a day (BID) | ORAL | 1 refills | Status: DC

## 2017-10-02 MED ORDER — EMPAGLIFLOZIN 10 MG PO TABS: 10.0000 mg | ORAL_TABLET | Freq: Every day | ORAL | 1 refills | Status: DC

## 2017-10-02 MED ORDER — METFORMIN HCL 500 MG PO TABS: 500.0000 mg | ORAL_TABLET | Freq: Two times a day (BID) | ORAL | 1 refills | Status: DC

## 2017-10-02 MED ORDER — GLIPIZIDE 5 MG PO TABS: 5.0000 mg | ORAL_TABLET | Freq: Two times a day (BID) | ORAL | 1 refills | Status: DC

## 2017-10-02 MED ORDER — ATORVASTATIN CALCIUM 20 MG PO TABS: 20.0000 mg | ORAL_TABLET | Freq: Every day | ORAL | 1 refills | Status: DC

## 2017-10-02 MED ORDER — LISINOPRIL-HYDROCHLOROTHIAZIDE 20-25 MG PO TABS: 1.0000 | ORAL_TABLET | Freq: Every day | ORAL | 1 refills | Status: DC

## 2017-10-02 NOTE — Progress Notes (Signed)
Subjective:  By signing my name below, I, Andrew Tanner, attest that this documentation has been prepared under the direction and in the presence of [name], MD Electronically Signed: Ladene Artist, ED Scribe 10/02/2017 at 8:58 AM.   Patient ID: Andrew Tanner, male    DOB: 05-Oct-1950, 67 y.o.   MRN: [id number]  Chief Complaint  Patient presents with  . Diabetes    follow up    HPI Andrew Tanner is a 67 y.o. male who presents to Primary Care at [facility] for f/u.  DM Lab Results  Component Value Date   HGBA1C 8.7 (H) 06/22/2017  Jardiance 10 mg qd, glipizide 5 mg qd, metformin 500 mg bid. Glipizide was increased to bid at last visit for improved control. Foot exam: Sept 2018. Pneumovax: Nov 2018. He had not tolerated higher doses of metformin, was improving on Jardiance vs Invokana. - Pt reports fasting morning blood glucose ranging 144-160, states he can't seem to get the average below 150. He walks ~5,000 steps/day and has changed his diet. States he feels better overall and has more energy since losing weight. He is currently taking 2 500 metformin tabs bid but has not tried 850 mg yet. Denies any side-effects. Wt Readings from Last 3 Encounters:  10/02/17 194 lb 9.6 oz (88.3 kg)  07/28/17 198 lb (89.8 kg)  06/22/17 204 lb (92.5 kg)   Peripheral Neuropathy Gabapentin with control of symptoms. Had been tolerating 1 tab at night, 1 mid day at last visit. Has been followed by [facility]. Note from Dr. Loanne Drilling in March, plan to continue to observe with conservative treatment. - Pt states that he typically has daily BMs, but he has noticed decreased BMs since starting gabapentin. Reports some mild constipation. States he has no issues having BMs when he has them and stools are regular but he typically doesn't go a day without a BM as he does now. Denies diarrhea, melena, hard stools.  Hyperlipidemia Lab Results  Component Value Date   CHOL 213 (H)  03/27/2017   HDL 32 (L) 03/27/2017   LDLCALC 134 (H) 03/27/2017   TRIG 235 (H) 03/27/2017   CHOLHDL 6.7 (H) 03/27/2017   Lab Results  Component Value Date   ALT 33 03/27/2017   AST 16 03/27/2017   ALKPHOS 84 03/27/2017   BILITOT 0.3 03/27/2017  Lipitor 20 mg qd. - Denies new myalgias, arthralgias, any other side-effects.  HTN Lisinopril-HCTZ 20-25 mg qd. - Denies any side-effects. BP Readings from Last 3 Encounters:  10/02/17 113/74  07/28/17 132/72  06/22/17 102/68   Lab Results  Component Value Date   CREATININE 1.05 03/27/2017    Patient Active Problem List   Diagnosis Date Noted  . Diabetes mellitus type II 06/02/2011  . Hypertension 06/02/2011  . Hyperlipemia 06/02/2011  . Cataract 06/02/2011   Past Medical History:  Diagnosis Date  . Cataract   . Diabetes mellitus without complication ([place])   . Hyperlipidemia   . Hypertension    Past Surgical History:  Procedure Laterality Date  . HERNIA REPAIR    . NECK SURGERY    . SPINE SURGERY    . TRANSURETHRAL RESECTION OF PROSTATE    . TURP VAPORIZATION     No Known Allergies Prior to Admission medications   Medication Sig Start Date End Date Taking? Authorizing Provider  ARIPiprazole (ABILIFY) 2 MG tablet Take 2 mg by mouth daily.    [provider]  atorvastatin (LIPITOR) 20 MG tablet Take  1 tablet (20 mg total) by mouth daily at 6 PM. 03/31/17   [name], MD  citalopram (CELEXA) 40 MG tablet Take 40 mg by mouth daily.    [provider]  clonazePAM (KLONOPIN) 0.5 MG tablet Take 0.5 mg by mouth 3 (three) times daily as needed.    [provider]  empagliflozin (JARDIANCE) 10 MG TABS tablet Take 10 mg by mouth daily. 07/21/17   [name], MD  gabapentin (NEURONTIN) 300 MG capsule 2 cap po QHS, 1 po QAM. 06/22/17   [name], MD  glipiZIDE (GLUCOTROL) 5 MG tablet Take 1 tablet (5 mg total) by mouth 2 (two) times daily before a meal. 07/03/17   [name], MD    glucose blood test strip 1 each by Other route daily. Use as instructed 07/26/17   [name], MD  ibuprofen (ADVIL,MOTRIN) 800 MG tablet Take 1 tablet (800 mg total) by mouth every 8 (eight) hours as needed for pain. 01/09/12   Shawnee Knapp, MD  lisinopril-hydrochlorothiazide (PRINZIDE,ZESTORETIC) 20-25 MG tablet TAKE 1 TABLET BY MOUTH ONCE DAILY 07/14/17   [name], MD  metFORMIN (GLUCOPHAGE) 500 MG tablet Take 1 tablet (500 mg total) by mouth 2 (two) times daily with a meal. 07/21/17   [name], MD  Multiple Vitamin (MULTIVITAMIN) tablet Take 1 tablet by mouth daily.    [provider]   Social History   Socioeconomic History  . Marital status: Married    Spouse name: [name]  . Number of children: Not on file  . Years of education: Not on file  . Highest education level: Not on file  Occupational History  . Not on file  Social Needs  . Financial resource strain: Not on file  . Food insecurity:    Worry: Not on file    Inability: Not on file  . Transportation needs:    Medical: Not on file    Non-medical: Not on file  Tobacco Use  . Smoking status: Former Research scientist (life sciences)  . Smokeless tobacco: Never Used  . Tobacco comment: quit 28 yrs ago  Substance and Sexual Activity  . Alcohol use: Yes    Comment: 1 glass wine per week  . Drug use: No  . Sexual activity: Never  Lifestyle  . Physical activity:    Days per week: Not on file    Minutes per session: Not on file  . Stress: Not on file  Relationships  . Social connections:    Talks on phone: Not on file    Gets together: Not on file    Attends religious service: Not on file    Active member of club or organization: Not on file    Attends meetings of clubs or organizations: Not on file    Relationship status: Not on file  . Intimate partner violence:    Fear of current or ex partner: Not on file    Emotionally abused: Not on file    Physically abused: Not on file    Forced sexual activity:  Not on file  Other Topics Concern  . Not on file  Social History Narrative  . Not on file   Review of Systems  Constitutional: Negative for fatigue and unexpected weight change.  Eyes: Negative for visual disturbance.  Respiratory: Negative for cough, chest tightness and shortness of breath.   Cardiovascular: Negative for chest pain, palpitations and leg swelling.  Gastrointestinal: Positive for constipation (mild). Negative for  abdominal pain, blood in stool and diarrhea.  Musculoskeletal: Negative for arthralgias and myalgias.  Neurological: Negative for dizziness, light-headedness and headaches.      Objective:   Physical Exam  Constitutional: He is oriented to person, place, and time. He appears well-developed and well-nourished.  HENT:  Head: Normocephalic and atraumatic.  Eyes: Pupils are equal, round, and reactive to light. EOM are normal.  Neck: No JVD present. Carotid bruit is not present.  Cardiovascular: Normal rate, regular rhythm and normal heart sounds.  No murmur heard. Pulmonary/Chest: Effort normal and breath sounds normal. He has no rales.  Musculoskeletal: He exhibits no edema.  Neurological: He is alert and oriented to person, place, and time.  Skin: Skin is warm and dry.  Psychiatric: He has a normal mood and affect.  Vitals reviewed.  Vitals:   10/02/17 0841  BP: 113/74  Pulse: 88  Temp: 98.5 F (36.9 C)  TempSrc: Oral  SpO2: 98%  Weight: 194 lb 9.6 oz (88.3 kg)  Height: 5\' 11"  (1.803 m)      Assessment & Plan:   Cari Vandeberg is a 67 y.o. male Essential hypertension - Plan: lisinopril-hydrochlorothiazide (PRINZIDE,ZESTORETIC) 20-25 MG tablet  -Stable, tolerating current meds.  No changes, labs pending  Type 2 diabetes mellitus with hyperglycemia, without long-term current use of insulin (HCC) - Plan: Hemoglobin A1c, Comprehensive metabolic panel, metFORMIN (GLUCOPHAGE) 500 MG tablet, glipiZIDE (GLUCOTROL) 5 MG tablet, empagliflozin  (JARDIANCE) 10 MG TABS tablet  -Still reports elevated home readings, specifically fasting.  However commended on his weight loss with diet and exercise efforts.  Check A1c, no med changes for now, but could consider slightly higher dose of metformin to see if that is tolerated.  No other changes at this time, recheck 3 months  Peripheral polyneuropathy - Plan: gabapentin (NEURONTIN) 300 MG capsule  -Stable, and tolerating twice daily dosing. Refilled.    Hyperlipidemia, unspecified hyperlipidemia type - Plan: Lipid panel, Comprehensive metabolic panel, atorvastatin (LIPITOR) 20 MG tablet  -Check labs, tolerating statin at current dose.  Continue Lipitor 20 mg daily.    Meds ordered this encounter  Medications  . metFORMIN (GLUCOPHAGE) 500 MG tablet    Sig: Take 1 tablet (500 mg total) by mouth 2 (two) times daily with a meal.    Dispense:  180 tablet    Refill:  1  . lisinopril-hydrochlorothiazide (PRINZIDE,ZESTORETIC) 20-25 MG tablet    Sig: Take 1 tablet by mouth daily.    Dispense:  90 tablet    Refill:  1  . glipiZIDE (GLUCOTROL) 5 MG tablet    Sig: Take 1 tablet (5 mg total) by mouth 2 (two) times daily before a meal.    Dispense:  180 tablet    Refill:  1  . gabapentin (NEURONTIN) 300 MG capsule    Sig: Take 1 capsule (300 mg total) by mouth 2 (two) times daily.    Dispense:  180 capsule    Refill:  1  . empagliflozin (JARDIANCE) 10 MG TABS tablet    Sig: Take 10 mg by mouth daily.    Dispense:  90 tablet    Refill:  1  . atorvastatin (LIPITOR) 20 MG tablet    Sig: Take 1 tablet (20 mg total) by mouth daily at 6 PM.    Dispense:  90 tablet    Refill:  1   Patient Instructions   Congrats on the weight loss. Keep up the good work with diet and exercise. I will check the 3 month  average today along with other meds. Depending on results can discuss changes - potentially 850mg  dose of metformin. No changes for now in meds.   Try increasing water and fiber in diet to help  keep from getting constipated.   Return to the clinic or go to the nearest emergency room if any of your symptoms worsen or new symptoms occur.  Recheck in 3 months.  Let me know if there are questions in the meantime.    IF you received an x-ray today, you will receive an invoice from [facility]. Please contact [facility] at [phone] with questions or concerns regarding your invoice.   IF you received labwork today, you will receive an invoice from [place]. Please contact [facility] at [phone] with questions or concerns regarding your invoice.   Our billing staff will not be able to assist you with questions regarding bills from these companies.  You will be contacted with the lab results as soon as they are available. The fastest way to get your results is to activate your My Chart account. Instructions are located on the last page of this paperwork. If you have not heard from [place] regarding the results in 2 weeks, please contact this office.       I personally performed the services described in this documentation, which was scribed in my presence. The recorded information has been reviewed and considered for accuracy and completeness, addended by me as needed, and agree with information above.  Signed,   [name], MD [facility] at [facility].  10/02/17 9:15 AM

## 2017-10-02 NOTE — Patient Instructions (Addendum)
Congrats on the weight loss. Keep up the good work with diet and exercise. I will check the 3 month average today along with other meds. Depending on results can discuss changes - potentially 850mg  dose of metformin. No changes for now in meds.   Try increasing water and fiber in diet to help keep from getting constipated.   Return to the clinic or go to the nearest emergency room if any of your symptoms worsen or new symptoms occur.  Recheck in 3 months.  Let me know if there are questions in the meantime.    IF you received an x-ray today, you will receive an invoice from [facility]. Please contact [facility] at [phone] with questions or concerns regarding your invoice.   IF you received labwork today, you will receive an invoice from [place]. Please contact [facility] at [phone] with questions or concerns regarding your invoice.   Our billing staff will not be able to assist you with questions regarding bills from these companies.  You will be contacted with the lab results as soon as they are available. The fastest way to get your results is to activate your My Chart account. Instructions are located on the last page of this paperwork. If you have not heard from [place] regarding the results in 2 weeks, please contact this office.

## 2017-12-06 ENCOUNTER — Other Ambulatory Visit: Payer: Self-pay | Admitting: Dermatology

## 2017-12-06 DIAGNOSIS — D229 Melanocytic nevi, unspecified: Secondary | ICD-10-CM | POA: Diagnosis not present

## 2017-12-06 DIAGNOSIS — L219 Seborrheic dermatitis, unspecified: Secondary | ICD-10-CM | POA: Diagnosis not present

## 2017-12-06 DIAGNOSIS — D0422 Carcinoma in situ of skin of left ear and external auricular canal: Secondary | ICD-10-CM | POA: Diagnosis not present

## 2017-12-25 ENCOUNTER — Encounter: Payer: Self-pay | Admitting: Family Medicine

## 2017-12-25 DIAGNOSIS — Z0000 Encounter for general adult medical examination without abnormal findings: Secondary | ICD-10-CM | POA: Diagnosis not present

## 2017-12-25 LAB — IFOBT (OCCULT BLOOD): IFOBT: NEGATIVE

## 2018-01-01 ENCOUNTER — Other Ambulatory Visit: Payer: Self-pay

## 2018-01-01 ENCOUNTER — Encounter: Payer: Self-pay | Admitting: Family Medicine

## 2018-01-01 ENCOUNTER — Ambulatory Visit: Payer: Medicare Other | Admitting: Family Medicine

## 2018-01-01 VITALS — BP 122/80 | HR 89 | Temp 98.4°F | Ht 71.0 in | Wt 195.6 lb

## 2018-01-01 DIAGNOSIS — F32A Depression, unspecified: Secondary | ICD-10-CM

## 2018-01-01 DIAGNOSIS — Z23 Encounter for immunization: Secondary | ICD-10-CM

## 2018-01-01 DIAGNOSIS — Z1159 Encounter for screening for other viral diseases: Secondary | ICD-10-CM

## 2018-01-01 DIAGNOSIS — F329 Major depressive disorder, single episode, unspecified: Secondary | ICD-10-CM | POA: Diagnosis not present

## 2018-01-01 DIAGNOSIS — F439 Reaction to severe stress, unspecified: Secondary | ICD-10-CM | POA: Diagnosis not present

## 2018-01-01 DIAGNOSIS — E1140 Type 2 diabetes mellitus with diabetic neuropathy, unspecified: Secondary | ICD-10-CM | POA: Diagnosis not present

## 2018-01-01 NOTE — Patient Instructions (Addendum)
I'm sorry to hear about your daughter's struggles. I would recommend continuing same meds for now, see info on stress below.  If you feel that your symptoms are worsening, I would recommend discussing further with your psychiatrist or meeting with counselor. Here are a few numbers.   Vivia Budge: [phone] Arvil [place]: [phone]  I will check A1c today, but no med changes at this point.   Thanks for coming in today. Recheck in 3 months for a physical.    Stress and Stress Management Stress is a normal reaction to life events. It is what you feel when life demands more than you are used to or more than you can handle. Some stress can be useful. For example, the stress reaction can help you catch the last bus of the day, study for a test, or meet a deadline at work. But stress that occurs too often or for too long can cause problems. It can affect your emotional health and interfere with relationships and normal daily activities. Too much stress can weaken your immune system and increase your risk for physical illness. If you already have a medical problem, stress can make it worse. What are the causes? All sorts of life events may cause stress. An event that causes stress for one person may not be stressful for another person. Major life events commonly cause stress. These may be positive or negative. Examples include losing your job, moving into a new home, getting married, having a baby, or losing a loved one. Less obvious life events may also cause stress, especially if they occur day after day or in combination. Examples include working long hours, driving in traffic, caring for children, being in debt, or being in a difficult relationship. What are the signs or symptoms? Stress may cause emotional symptoms including, the following:  Anxiety. This is feeling worried, afraid, on edge, overwhelmed, or out of control.  Anger. This is feeling irritated or impatient.  Depression. This is  feeling sad, down, helpless, or guilty.  Difficulty focusing, remembering, or making decisions.  Stress may cause physical symptoms, including the following:  Aches and pains. These may affect your head, neck, back, stomach, or other areas of your body.  Tight muscles or clenched jaw.  Low energy or trouble sleeping.  Stress may cause unhealthy behaviors, including the following:  Eating to feel better (overeating) or skipping meals.  Sleeping too little, too much, or both.  Working too much or putting off tasks (procrastination).  Smoking, drinking alcohol, or using drugs to feel better.  How is this diagnosed? Stress is diagnosed through an assessment by your health care provider. Your health care provider will ask questions about your symptoms and any stressful life events.Your health care provider will also ask about your medical history and may order blood tests or other tests. Certain medical conditions and medicine can cause physical symptoms similar to stress. Mental illness can cause emotional symptoms and unhealthy behaviors similar to stress. Your health care provider may refer you to a mental health professional for further evaluation. How is this treated? Stress management is the recommended treatment for stress.The goals of stress management are reducing stressful life events and coping with stress in healthy ways. Techniques for reducing stressful life events include the following:  Stress identification. Self-monitor for stress and identify what causes stress for you. These skills may help you to avoid some stressful events.  Time management. Set your priorities, keep a calendar of events, and learn to say "no."  These tools can help you avoid making too many commitments.  Techniques for coping with stress include the following:  Rethinking the problem. Try to think realistically about stressful events rather than ignoring them or overreacting. Try to find the  positives in a stressful situation rather than focusing on the negatives.  Exercise. Physical exercise can release both physical and emotional tension. The key is to find a form of exercise you enjoy and do it regularly.  Relaxation techniques. These relax the body and mind. Examples include yoga, meditation, tai chi, biofeedback, deep breathing, progressive muscle relaxation, listening to music, being out in nature, journaling, and other hobbies. Again, the key is to find one or more that you enjoy and can do regularly.  Healthy lifestyle. Eat a balanced diet, get plenty of sleep, and do not smoke. Avoid using alcohol or drugs to relax.  Strong support network. Spend time with family, friends, or other people you enjoy being around.Express your feelings and talk things over with someone you trust.  Counseling or talktherapy with a mental health professional may be helpful if you are having difficulty managing stress on your own. Medicine is typically not recommended for the treatment of stress.Talk to your health care provider if you think you need medicine for symptoms of stress. Follow these instructions at home:  Keep all follow-up visits as directed by your health care provider.  Take all medicines as directed by your health care provider. Contact a health care provider if:  Your symptoms get worse or you start having new symptoms.  You feel overwhelmed by your problems and can no longer manage them on your own. Get help right away if:  You feel like hurting yourself or someone else. This information is not intended to replace advice given to you by your health care provider. Make sure you discuss any questions you have with your health care provider. Document Released: 09/21/2000 Document Revised: 09/03/2015 Document Reviewed: 11/20/2012 Elsevier Interactive Patient Education  [employer].   If you have lab work done today you will be contacted with your lab results within  the next 2 weeks.  If you have not heard from [place] then please contact us. The fastest way to get your results is to register for My Chart.   IF you received an x-ray today, you will receive an invoice from [facility]. Please contact [facility] Radiology at [phone] with questions or concerns regarding your invoice.   IF you received labwork today, you will receive an invoice from [place]. Please contact [facility] at [phone] with questions or concerns regarding your invoice.   Our billing staff will not be able to assist you with questions regarding bills from these companies.  You will be contacted with the lab results as soon as they are available. The fastest way to get your results is to activate your My Chart account. Instructions are located on the last page of this paperwork. If you have not heard from [place] regarding the results in 2 weeks, please contact this office.

## 2018-01-01 NOTE — Progress Notes (Signed)
I,[name],acting as a scribe for Andrew Agreste, MD.,have documented all relevant documentation on the behalf of Andrew Agreste, MD,as directed by  Andrew Agreste, MD while in the presence of Andrew Agreste, MD. 01/01/2018  8:50 AM   Subjective:    Patient ID: Andrew Tanner, male    DOB: [birth year], 67 y.o.   MRN: [id number]  Chief Complaint  Patient presents with  . Diabetes    3 m f/u   . Stress     HPI Andrew Tanner is a 67 y.o. male who presents to Primary Care at [facility] for follow up of:  Diabetes Continues on: Jardiance 10 mg qd, glipizide 5 mg bid, metformin 500 mg bid (he notes that he has been taking TID). He denies having stomach upset with taking it TID. He is on an ace inhibitor as well as a statin.  Lab Results  Component Value Date   HGBA1C 6.9 (H) 10/02/2017   Microalbumin- 1 year ago was normal   Optho exam is due -last 01/01/17 -plans on scheduling Pneumovax - previously prevnar, due for pneumovax in November 2019  Health maintenance Colonoscopy - Cologuard screening 2 weeks ago and awaiting results.  Due for hep c screening and physical.    HTN BP Readings from Last 3 Encounters:  01/01/18 122/80  10/02/17 113/74  07/28/17 132/72   Lab Results  Component Value Date   CREATININE 0.97 10/02/2017  Denies new side effects from medication.  Upper chest discomfort when stressed/anxious, but no exertional chest pain.   Hyperlipidemia Lab Results  Component Value Date   CHOL 167 10/02/2017   HDL 40 10/02/2017   LDLCALC 105 (H) 10/02/2017   TRIG 111 10/02/2017   CHOLHDL 4.2 10/02/2017   -Takes statin, no new side effects.  Stress Followed by psychiatrist, Dr. Casimiro Needle and follows up every 6 months. He notes that his daughter has had emotional issues and had admitted herself due to [place] and depression He has been stressed and has a h/o of depression. He feels that his daughter is making some progress.  He feels that his  medications have been helping. 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 His work is going to be featured in [employer] in October 2019. He is traveling to [place] for his 50th high school reunion, and sharing driving has contributed to some of his anxiety.  He notes that he is more forgetful within the last few months. He is considering following up with a therapist.   He has occasional chest discomfort when he is stressed only, quickly resolves and denies any chest pain or symptoms with exertion. He denies SOB or DOE.   Depression screen [facility] 2/9 01/01/2018 10/02/2017 07/28/2017 06/22/2017 05/16/2017  Decreased Interest 0 0 0 0 0  Down, Depressed, Hopeless 0 0 0 0 0  PHQ - 2 Score 0 0 0 0 0  Altered sleeping - - - - -  Tired, decreased energy - - - - -  Change in appetite - - - - -  Feeling bad or failure about yourself  - - - - -  Trouble concentrating - - - - -  Moving slowly or fidgety/restless - - - - -  Suicidal thoughts - - - - -  PHQ-9 Score - - - - -  Difficult doing work/chores - - - - -     Peripheral Neuropathy Followed by [place] [facility]  Used gabapentin to help control symptoms. He denies any complications from this medication.     Patient Active Problem List   Diagnosis Date Noted  . Diabetes mellitus type II 06/02/2011  . Hypertension 06/02/2011  . Hyperlipemia 06/02/2011  . Cataract 06/02/2011   Past Medical History:  Diagnosis Date  . Cataract   . Diabetes mellitus without complication ([place])   . Hyperlipidemia   . Hypertension    Past Surgical History:  Procedure Laterality Date  . HERNIA REPAIR    . NECK SURGERY    . SPINE SURGERY    . TRANSURETHRAL RESECTION OF PROSTATE    . TURP VAPORIZATION     No Known Allergies Prior to Admission medications   Medication Sig Start Date End Date Taking? Authorizing Provider  ARIPiprazole (ABILIFY) 2 MG tablet Take 2 mg by mouth daily.    [provider]  atorvastatin (LIPITOR) 20 MG tablet Take 1 tablet (20 mg total) by mouth daily at 6 PM. 10/02/17   Andrew Agreste, MD  citalopram (CELEXA) 40 MG tablet Take 40 mg by mouth daily.    [provider]  clonazePAM (KLONOPIN) 0.5 MG tablet Take 0.5 mg by mouth 3 (three) times daily as needed.    [provider]  empagliflozin (JARDIANCE) 10 MG TABS tablet Take 10 mg by mouth daily. 10/02/17   Andrew Agreste, MD  gabapentin (NEURONTIN) 300 MG capsule Take 1 capsule (300 mg total) by mouth 2 (two) times daily. 10/02/17   Andrew Agreste, MD  glipiZIDE (GLUCOTROL) 5 MG tablet Take 1 tablet (5 mg total) by mouth 2 (two) times daily before a meal. 10/02/17   Andrew Agreste, MD  glucose blood test strip 1 each by Other route daily. Use as instructed 07/26/17   Andrew Agreste, MD  ibuprofen (ADVIL,MOTRIN) 800 MG tablet Take 1 tablet (800 mg total) by mouth every 8 (eight) hours as needed for pain. 01/09/12   Shawnee Knapp, MD  lisinopril-hydrochlorothiazide (PRINZIDE,ZESTORETIC) 20-25 MG tablet Take 1 tablet by mouth daily. 10/02/17   Andrew Agreste, MD  metFORMIN (GLUCOPHAGE) 500 MG tablet Take 1 tablet (500 mg total) by mouth 2 (two) times daily with a meal. 10/02/17   Andrew Agreste, MD  Multiple Vitamin (MULTIVITAMIN) tablet Take 1 tablet by mouth daily.    [provider]   Social History   Socioeconomic History  . Marital status: Married    Spouse name: [name]  . Number of children: Not on file  . Years of education: Not on file  . Highest education level: Not on file  Occupational History  . Not on file  Social Needs  . Financial resource strain: Not on file  . Food insecurity:    Worry: Not on file    Inability: Not on file  . Transportation needs:    Medical: Not on file    Non-medical: Not on file  Tobacco Use  . Smoking status: Former Research scientist (life sciences)  . Smokeless tobacco: Never Used  . Tobacco comment: quit 28 yrs ago  Substance  and Sexual Activity  . Alcohol use: Yes    Comment: 1 glass wine per week  . Drug use: No  . Sexual activity: Never  Lifestyle  . Physical activity:    Days per week: Not on file    Minutes per session: Not on file  . Stress: Not on file  Relationships  . Social connections:    Talks  on phone: Not on file    Gets together: Not on file    Attends religious service: Not on file    Active member of club or organization: Not on file    Attends meetings of clubs or organizations: Not on file    Relationship status: Not on file  . Intimate partner violence:    Fear of current or ex partner: Not on file    Emotionally abused: Not on file    Physically abused: Not on file    Forced sexual activity: Not on file  Other Topics Concern  . Not on file  Social History Narrative  . Not on file    Review of Systems  Constitutional: Negative for fatigue and unexpected weight change.  Eyes: Negative for visual disturbance.  Respiratory: Negative for cough, chest tightness and shortness of breath.   Cardiovascular: Negative for chest pain, palpitations and leg swelling.       Chest pain only when emotionally stressed, no SOB, or DOE  Gastrointestinal: Negative for abdominal pain and blood in stool.  Neurological: Negative for dizziness, light-headedness and headaches.       Objective:   Physical Exam  Constitutional: He is oriented to person, place, and time. He appears well-developed and well-nourished.  HENT:  Head: Normocephalic and atraumatic.  Eyes: Pupils are equal, round, and reactive to light. EOM are normal.  Neck: No JVD present. Carotid bruit is not present.  Cardiovascular: Normal rate, regular rhythm and normal heart sounds.  No murmur heard. Pulmonary/Chest: Effort normal and breath sounds normal. He has no rales.  Musculoskeletal: He exhibits no edema.  Neurological: He is alert and oriented to person, place, and time.  Skin: Skin is warm and dry.  Psychiatric: He has  a normal mood and affect.  Vitals reviewed.    Vitals:   01/01/18 0849  BP: 122/80  Pulse: 89  Temp: 98.4 F (36.9 C)  SpO2: 96%       Assessment & Plan:  Keatin Benham is a 67 y.o. male Type 2 diabetes mellitus with diabetic neuropathy, without long-term current use of insulin ([place]) - Plan: Microalbumin/Creatinine Ratio, Urine, Hemoglobin A1c  -Previously controlled although he has been taking extra dose of metformin.  Tolerating other meds.  Check A1c to determine if the 3 times daily dosing is continued to be necessary.  Recommend ophthalmology visit.  Check urine microalbumin creatinine.  Likely will get pneumonia vaccine next visit.  Need for influenza vaccination - Plan: Flu vaccine HIGH DOSE PF (Fluzone High dose)  Depression, unspecified depression type Situational stress  -Followed by psychiatry, reports improved symptoms but still feels that counseling may be helpful.  Phone numbers provided.   Need for hepatitis C screening test - Plan: Hepatitis C antibody  No orders of the defined types were placed in this encounter.  Patient Instructions    I'm sorry to hear about your daughter's struggles. I would recommend continuing same meds for now, see info on stress below.  If you feel that your symptoms are worsening, I would recommend discussing further with your psychiatrist or meeting with counselor. Here are a few numbers.   Vivia Budge: [phone] Arvil Chaco: [phone]  I will check A1c today, but no med changes at this point.   Thanks for coming in today. Recheck in 3 months for a physical.    Stress and Stress Management Stress is a normal reaction to life events. It is what you feel when life demands more than you  are used to or more than you can handle. Some stress can be useful. For example, the stress reaction can help you catch the last bus of the day, study for a test, or meet a deadline at work. But stress that occurs too often or for too long can  cause problems. It can affect your emotional health and interfere with relationships and normal daily activities. Too much stress can weaken your immune system and increase your risk for physical illness. If you already have a medical problem, stress can make it worse. What are the causes? All sorts of life events may cause stress. An event that causes stress for one person may not be stressful for another person. Major life events commonly cause stress. These may be positive or negative. Examples include losing your job, moving into a new home, getting married, having a baby, or losing a loved one. Less obvious life events may also cause stress, especially if they occur day after day or in combination. Examples include working long hours, driving in traffic, caring for children, being in debt, or being in a difficult relationship. What are the signs or symptoms? Stress may cause emotional symptoms including, the following:  Anxiety. This is feeling worried, afraid, on edge, overwhelmed, or out of control.  Anger. This is feeling irritated or impatient.  Depression. This is feeling sad, down, helpless, or guilty.  Difficulty focusing, remembering, or making decisions.  Stress may cause physical symptoms, including the following:  Aches and pains. These may affect your head, neck, back, stomach, or other areas of your body.  Tight muscles or clenched jaw.  Low energy or trouble sleeping.  Stress may cause unhealthy behaviors, including the following:  Eating to feel better (overeating) or skipping meals.  Sleeping too little, too much, or both.  Working too much or putting off tasks (procrastination).  Smoking, drinking alcohol, or using drugs to feel better.  How is this diagnosed? Stress is diagnosed through an assessment by your health care provider. Your health care provider will ask questions about your symptoms and any stressful life events.Your health care provider will also  ask about your medical history and may order blood tests or other tests. Certain medical conditions and medicine can cause physical symptoms similar to stress. Mental illness can cause emotional symptoms and unhealthy behaviors similar to stress. Your health care provider may refer you to a mental health professional for further evaluation. How is this treated? Stress management is the recommended treatment for stress.The goals of stress management are reducing stressful life events and coping with stress in healthy ways. Techniques for reducing stressful life events include the following:  Stress identification. Self-monitor for stress and identify what causes stress for you. These skills may help you to avoid some stressful events.  Time management. Set your priorities, keep a calendar of events, and learn to say "no." These tools can help you avoid making too many commitments.  Techniques for coping with stress include the following:  Rethinking the problem. Try to think realistically about stressful events rather than ignoring them or overreacting. Try to find the positives in a stressful situation rather than focusing on the negatives.  Exercise. Physical exercise can release both physical and emotional tension. The key is to find a form of exercise you enjoy and do it regularly.  Relaxation techniques. These relax the body and mind. Examples include yoga, meditation, tai chi, biofeedback, deep breathing, progressive muscle relaxation, listening to music, being out in nature, journaling, and  other hobbies. Again, the key is to find one or more that you enjoy and can do regularly.  Healthy lifestyle. Eat a balanced diet, get plenty of sleep, and do not smoke. Avoid using alcohol or drugs to relax.  Strong support network. Spend time with family, friends, or other people you enjoy being around.Express your feelings and talk things over with someone you trust.  Counseling or talktherapy with  a mental health professional may be helpful if you are having difficulty managing stress on your own. Medicine is typically not recommended for the treatment of stress.Talk to your health care provider if you think you need medicine for symptoms of stress. Follow these instructions at home:  Keep all follow-up visits as directed by your health care provider.  Take all medicines as directed by your health care provider. Contact a health care provider if:  Your symptoms get worse or you start having new symptoms.  You feel overwhelmed by your problems and can no longer manage them on your own. Get help right away if:  You feel like hurting yourself or someone else. This information is not intended to replace advice given to you by your health care provider. Make sure you discuss any questions you have with your health care provider. Document Released: 09/21/2000 Document Revised: 09/03/2015 Document Reviewed: 11/20/2012 Elsevier Interactive Patient Education  [employer].   If you have lab work done today you will be contacted with your lab results within the next 2 weeks.  If you have not heard from [place] then please contact us. The fastest way to get your results is to register for My Chart.   IF you received an x-ray today, you will receive an invoice from [facility]. Please contact [facility] at [phone] with questions or concerns regarding your invoice.   IF you received labwork today, you will receive an invoice from [facility]. Please contact [facility] at [phone] with questions or concerns regarding your invoice.   Our billing staff will not be able to assist you with questions regarding bills from these companies.  You will be contacted with the lab results as soon as they are available. The fastest way to get your results is to activate your My Chart account. Instructions are located on the last page of this paperwork. If you have not heard from [place]  regarding the results in 2 weeks, please contact this office.       I personally performed the services described in this documentation, which was scribed in my presence. The recorded information has been reviewed and considered for accuracy and completeness, addended by me as needed, and agree with information above.  Signed,   [name], MD [facility] [place].  01/01/18 9:35 AM

## 2018-01-02 LAB — HEMOGLOBIN A1C
ESTIMATED AVERAGE GLUCOSE: 192 mg/dL
HEMOGLOBIN A1C: 8.3 % — AB (ref 4.8–5.6)

## 2018-01-02 LAB — HEPATITIS C ANTIBODY: Hep C Virus Ab: <0.1 s/co ratio (ref 0.0–0.9)

## 2018-01-02 LAB — MICROALBUMIN / CREATININE URINE RATIO
Creatinine, Urine: 82.2 mg/dL
Microalb/Creat Ratio: 4.1 mg/g creat (ref 0.0–30.0)
Microalbumin, Urine: 3.4 ug/mL

## 2018-01-11 ENCOUNTER — Other Ambulatory Visit: Payer: Self-pay | Admitting: Family Medicine

## 2018-01-11 DIAGNOSIS — G629 Polyneuropathy, unspecified: Secondary | ICD-10-CM

## 2018-01-11 NOTE — Telephone Encounter (Signed)
[place] called and spoke to [employer], Merchant navy officer. I advised the request for Gabapentin and that the refill was sent on 10/02/17 #180/1 refill. She says she has it and will get it ready for the patient to pick up.

## 2018-01-18 ENCOUNTER — Other Ambulatory Visit: Payer: Self-pay | Admitting: Dermatology

## 2018-01-18 DIAGNOSIS — C44229 Squamous cell carcinoma of skin of left ear and external auricular canal: Secondary | ICD-10-CM | POA: Diagnosis not present

## 2018-01-18 DIAGNOSIS — D0422 Carcinoma in situ of skin of left ear and external auricular canal: Secondary | ICD-10-CM | POA: Diagnosis not present

## 2018-01-21 ENCOUNTER — Encounter: Payer: Self-pay | Admitting: Family Medicine

## 2018-01-22 MED ORDER — METFORMIN HCL 850 MG PO TABS: 850.0000 mg | ORAL_TABLET | Freq: Two times a day (BID) | ORAL | 1 refills | Status: DC

## 2018-02-07 DIAGNOSIS — F3341 Major depressive disorder, recurrent, in partial remission: Secondary | ICD-10-CM | POA: Diagnosis not present

## 2018-02-28 DIAGNOSIS — F3341 Major depressive disorder, recurrent, in partial remission: Secondary | ICD-10-CM | POA: Diagnosis not present

## 2018-03-19 ENCOUNTER — Other Ambulatory Visit: Payer: Self-pay | Admitting: Dermatology

## 2018-03-19 DIAGNOSIS — D044 Carcinoma in situ of skin of scalp and neck: Secondary | ICD-10-CM | POA: Diagnosis not present

## 2018-03-19 DIAGNOSIS — D0439 Carcinoma in situ of skin of other parts of face: Secondary | ICD-10-CM | POA: Diagnosis not present

## 2018-03-19 DIAGNOSIS — L570 Actinic keratosis: Secondary | ICD-10-CM | POA: Diagnosis not present

## 2018-03-22 DIAGNOSIS — F3341 Major depressive disorder, recurrent, in partial remission: Secondary | ICD-10-CM | POA: Diagnosis not present

## 2018-03-27 ENCOUNTER — Encounter: Payer: Self-pay | Admitting: Family Medicine

## 2018-03-27 ENCOUNTER — Ambulatory Visit: Payer: Medicare Other | Admitting: Family Medicine

## 2018-04-13 ENCOUNTER — Ambulatory Visit: Payer: Self-pay | Admitting: Family Medicine

## 2018-04-19 DIAGNOSIS — F3341 Major depressive disorder, recurrent, in partial remission: Secondary | ICD-10-CM | POA: Diagnosis not present

## 2018-04-26 DIAGNOSIS — C4441 Basal cell carcinoma of skin of scalp and neck: Secondary | ICD-10-CM | POA: Diagnosis not present

## 2018-05-10 DIAGNOSIS — F3341 Major depressive disorder, recurrent, in partial remission: Secondary | ICD-10-CM | POA: Diagnosis not present

## 2018-05-29 ENCOUNTER — Other Ambulatory Visit: Payer: Self-pay | Admitting: Family Medicine

## 2018-05-29 DIAGNOSIS — E785 Hyperlipidemia, unspecified: Secondary | ICD-10-CM

## 2018-05-29 NOTE — Telephone Encounter (Signed)
Requested Prescriptions  Pending Prescriptions Disp Refills  . atorvastatin (LIPITOR) 10 MG tablet [Pharmacy Med Name: Atorvastatin Calcium 10 MG Oral Tablet] 90 tablet 0    Sig: TAKE 1 TABLET BY MOUTH ONCE DAILY AT 6PM     Cardiovascular:  Antilipid - Statins Failed - 05/29/2018 12:42 PM      Failed - LDL in normal range and within 360 days    LDL Calculated  Date Value Ref Range Status  10/02/2017 105 (H) 0 - 99 mg/dL Final         Passed - Total Cholesterol in normal range and within 360 days    Cholesterol, Total  Date Value Ref Range Status  10/02/2017 167 100 - 199 mg/dL Final         Passed - HDL in normal range and within 360 days    HDL  Date Value Ref Range Status  10/02/2017 40 >39 mg/dL Final         Passed - Triglycerides in normal range and within 360 days    Triglycerides  Date Value Ref Range Status  10/02/2017 111 0 - 149 mg/dL Final         Passed - Patient is not pregnant      Passed - Valid encounter within last 12 months    Recent Outpatient Visits          4 months ago Type 2 diabetes mellitus with diabetic neuropathy, without long-term current use of insulin ([place])   [facility], [name], MD   7 months ago Essential hypertension   [facility], [name], MD   10 months ago Type 2 diabetes mellitus with hyperglycemia, without long-term current use of insulin [facility])   [facility], [name], MD   11 months ago Type 2 diabetes mellitus with hyperglycemia, without long-term current use of insulin [facility])   [facility], [name], MD   1 year ago Peripheral polyneuropathy   [facility], [name], MD

## 2018-06-04 DIAGNOSIS — F3341 Major depressive disorder, recurrent, in partial remission: Secondary | ICD-10-CM | POA: Diagnosis not present

## 2018-06-11 DIAGNOSIS — F3341 Major depressive disorder, recurrent, in partial remission: Secondary | ICD-10-CM | POA: Diagnosis not present

## 2018-07-23 DIAGNOSIS — F3341 Major depressive disorder, recurrent, in partial remission: Secondary | ICD-10-CM | POA: Diagnosis not present

## 2018-08-02 DIAGNOSIS — F3341 Major depressive disorder, recurrent, in partial remission: Secondary | ICD-10-CM | POA: Diagnosis not present

## 2018-08-20 DIAGNOSIS — F3341 Major depressive disorder, recurrent, in partial remission: Secondary | ICD-10-CM | POA: Diagnosis not present

## 2018-08-21 DIAGNOSIS — D229 Melanocytic nevi, unspecified: Secondary | ICD-10-CM | POA: Diagnosis not present

## 2018-08-21 DIAGNOSIS — L570 Actinic keratosis: Secondary | ICD-10-CM | POA: Diagnosis not present

## 2018-08-21 DIAGNOSIS — L738 Other specified follicular disorders: Secondary | ICD-10-CM | POA: Diagnosis not present

## 2018-09-04 ENCOUNTER — Other Ambulatory Visit: Payer: Self-pay | Admitting: Family Medicine

## 2018-09-04 DIAGNOSIS — G629 Polyneuropathy, unspecified: Secondary | ICD-10-CM

## 2018-09-04 NOTE — Telephone Encounter (Signed)
Requested Prescriptions  Pending Prescriptions Disp Refills  . gabapentin (NEURONTIN) 300 MG capsule [Pharmacy Med Name: Gabapentin 300 MG Oral Capsule] 180 capsule 0    Sig: Take 1 capsule by mouth twice daily     Neurology: Anticonvulsants - gabapentin Passed - 09/04/2018  8:50 AM      Passed - Valid encounter within last 12 months    Recent Outpatient Visits          8 months ago Type 2 diabetes mellitus with diabetic neuropathy, without long-term current use of insulin [facility])   [facility], [name], MD   11 months ago Essential hypertension   [facility], [name], MD   1 year ago Type 2 diabetes mellitus with hyperglycemia, without long-term current use of insulin [facility])   [facility], [name], MD   1 year ago Type 2 diabetes mellitus with hyperglycemia, without long-term current use of insulin [facility])   [facility], [name], MD   1 year ago Peripheral polyneuropathy   [facility], [name], MD

## 2018-10-31 DIAGNOSIS — F3341 Major depressive disorder, recurrent, in partial remission: Secondary | ICD-10-CM | POA: Diagnosis not present

## 2018-12-24 ENCOUNTER — Encounter: Payer: Self-pay | Admitting: Family Medicine

## 2018-12-31 ENCOUNTER — Other Ambulatory Visit: Payer: Self-pay

## 2018-12-31 ENCOUNTER — Encounter: Payer: Self-pay | Admitting: Family Medicine

## 2018-12-31 ENCOUNTER — Ambulatory Visit (INDEPENDENT_AMBULATORY_CARE_PROVIDER_SITE_OTHER): Payer: Medicare Other | Admitting: Family Medicine

## 2018-12-31 VITALS — BP 166/99 | HR 105 | Temp 99.1°F | Resp 14 | Wt 196.0 lb

## 2018-12-31 DIAGNOSIS — E1165 Type 2 diabetes mellitus with hyperglycemia: Secondary | ICD-10-CM

## 2018-12-31 DIAGNOSIS — Z9114 Patient's other noncompliance with medication regimen: Secondary | ICD-10-CM

## 2018-12-31 DIAGNOSIS — Z23 Encounter for immunization: Secondary | ICD-10-CM | POA: Diagnosis not present

## 2018-12-31 DIAGNOSIS — E785 Hyperlipidemia, unspecified: Secondary | ICD-10-CM | POA: Diagnosis not present

## 2018-12-31 DIAGNOSIS — G629 Polyneuropathy, unspecified: Secondary | ICD-10-CM

## 2018-12-31 DIAGNOSIS — I10 Essential (primary) hypertension: Secondary | ICD-10-CM

## 2018-12-31 LAB — GLUCOSE, POCT (MANUAL RESULT ENTRY): POC Glucose: 289 mg/dl — AB (ref 70–99)

## 2018-12-31 MED ORDER — GABAPENTIN 300 MG PO CAPS: 300.0000 mg | ORAL_CAPSULE | Freq: Two times a day (BID) | ORAL | 0 refills | Status: DC

## 2018-12-31 MED ORDER — GLIPIZIDE 5 MG PO TABS: 5.0000 mg | ORAL_TABLET | Freq: Two times a day (BID) | ORAL | 1 refills | Status: DC

## 2018-12-31 MED ORDER — METFORMIN HCL 500 MG PO TABS: 500.0000 mg | ORAL_TABLET | Freq: Three times a day (TID) | ORAL | 0 refills | Status: DC

## 2018-12-31 MED ORDER — JARDIANCE 10 MG PO TABS: 10.0000 mg | ORAL_TABLET | Freq: Every day | ORAL | 1 refills | Status: DC

## 2018-12-31 MED ORDER — LISINOPRIL-HYDROCHLOROTHIAZIDE 20-25 MG PO TABS: 1.0000 | ORAL_TABLET | Freq: Every day | ORAL | 1 refills | Status: DC

## 2018-12-31 MED ORDER — ATORVASTATIN CALCIUM 20 MG PO TABS: 20.0000 mg | ORAL_TABLET | Freq: Every day | ORAL | 1 refills | Status: DC

## 2018-12-31 NOTE — Progress Notes (Signed)
Subjective:    Patient ID: Andrew Tanner, male    DOB: [birth year], 68 y.o.   MRN: [id number]  HPI Andrew Tanner is a 68 y.o. male Presents today for: Chief Complaint  Patient presents with  . Diabetes    Patient stated he has been put of his diabetes medication and this is the reason blood sugar is so high. Been without med for 1 wk. Blood sugar today was 360. Would like to go on meformin 500mg   twice daily due to the 850 mg is bothering me    Diabetes: Complicated by hyperglycemia, and without recent follow-up.  Last evaluated in September 2019.  A1c uncontrolled at that time at 8.3.  He has been taking Jardiance 10 mg daily, glipizide 5 mg twice daily, metformin 500 mg 3 times daily.  Small increase of metformin to 850 mg twice daily. Side pain on R abdomen few weeks after starting 850mg  BID, so decreased to 1 per day.  Did not advise me or other medical provider of this change. Side pain resolved at lower dose. Did tolerate 500mg  TID.  No current n/v/abd pain.  Home reading today in upper 300's. While on meds - blood sugar in 200's.  Decreased exercise. Off metformin past 6 days, other meds out for months.  Gabapentin 300mg  BID. Still taking for neuropathy, some increased sx's with elevated blood sugar.   Microalbumin:nl 01/01/18 Optho, foot exam, pneumovax: optho - January.  Flu and pneumonia vaccine today.  Diabetic Foot Exam - Simple   No data filed    foot exam 12/2017.   Lab Results  Component Value Date   HGBA1C 8.3 (H) 01/01/2018   HGBA1C 6.9 (H) 10/02/2017   HGBA1C 8.7 (H) 06/22/2017   Lab Results  Component Value Date   MICROALBUR 3.8 (H) 03/10/2014   LDLCALC 105 (H) 10/02/2017   CREATININE 0.97 10/02/2017   Hypertension: BP Readings from Last 3 Encounters:  12/31/18 (!) 166/99  01/01/18 122/80  10/02/17 113/74   Lab Results  Component Value Date   CREATININE 0.97 10/02/2017  Previously controlled on lisinopril hydrochlorothiazide 20/25 mg  daily. Out of BP med for few months.    Off blood pressure meds past week - no recent home readings.  Constitutional: Negative for fatigue and unexpected weight change.  Eyes: Negative for visual disturbance.  Respiratory: Negative for cough, chest tightness and shortness of breath.   Cardiovascular: Negative for chest pain, palpitations and leg swelling.  Gastrointestinal: Negative for abdominal pain and blood in stool.  Neurological: Negative for dizziness, light-headedness and headaches.   Hyperlipidemia:  Lab Results  Component Value Date   CHOL 167 10/02/2017   HDL 40 10/02/2017   LDLCALC 105 (H) 10/02/2017   TRIG 111 10/02/2017   CHOLHDL 4.2 10/02/2017   Lab Results  Component Value Date   ALT 29 10/02/2017   AST 22 10/02/2017   ALKPHOS 69 10/02/2017   BILITOT 0.2 10/02/2017  Previously treated with Lipitor 20 mg daily. Off for a few months. No new side effects on liptitor when taking.  Patient Active Problem List   Diagnosis Date Noted  . Diabetes mellitus type II 06/02/2011  . Hypertension 06/02/2011  . Hyperlipemia 06/02/2011  . Cataract 06/02/2011   Past Medical History:  Diagnosis Date  . Cataract   . Diabetes mellitus without complication ([place])   . Hyperlipidemia   . Hypertension    Past Surgical History:  Procedure Laterality Date  . HERNIA REPAIR    . NECK SURGERY    .  SPINE SURGERY    . TRANSURETHRAL RESECTION OF PROSTATE    . TURP VAPORIZATION     No Known Allergies Prior to Admission medications   Medication Sig Start Date End Date Taking? Authorizing Provider  ARIPiprazole (ABILIFY) 2 MG tablet Take 2 mg by mouth daily.   Yes [provider]  atorvastatin (LIPITOR) 20 MG tablet Take 1 tablet (20 mg total) by mouth daily at 6 PM. 10/02/17  Yes [name], MD  citalopram (CELEXA) 40 MG tablet Take 40 mg by mouth daily.   Yes [provider]  clonazePAM (KLONOPIN) 0.5 MG tablet Take 0.5 mg by mouth 3 (three) times daily as  needed.   Yes [provider]  empagliflozin (JARDIANCE) 10 MG TABS tablet Take 10 mg by mouth daily. 10/02/17  Yes [name], MD  gabapentin (NEURONTIN) 300 MG capsule Take 1 capsule by mouth twice daily 09/04/18  Yes [name], MD  glipiZIDE (GLUCOTROL) 5 MG tablet Take 1 tablet (5 mg total) by mouth 2 (two) times daily before a meal. 10/02/17  Yes [name], MD  glucose blood test strip 1 each by Other route daily. Use as instructed 07/26/17  Yes [name], MD  ibuprofen (ADVIL,MOTRIN) 800 MG tablet Take 1 tablet (800 mg total) by mouth every 8 (eight) hours as needed for pain. 01/09/12  Yes Shawnee Knapp, MD  lisinopril-hydrochlorothiazide (PRINZIDE,ZESTORETIC) 20-25 MG tablet Take 1 tablet by mouth daily. 10/02/17  Yes [name], MD  metFORMIN (GLUCOPHAGE) 850 MG tablet Take 1 tablet (850 mg total) by mouth 2 (two) times daily with a meal. 01/22/18  Yes [name], MD  Multiple Vitamin (MULTIVITAMIN) tablet Take 1 tablet by mouth daily.   Yes [provider]   Social History   Socioeconomic History  . Marital status: Married    Spouse name: [name]  . Number of children: Not on file  . Years of education: Not on file  . Highest education level: Not on file  Occupational History  . Not on file  Social Needs  . Financial resource strain: Not on file  . Food insecurity    Worry: Not on file    Inability: Not on file  . Transportation needs    Medical: Not on file    Non-medical: Not on file  Tobacco Use  . Smoking status: Former Research scientist (life sciences)  . Smokeless tobacco: Never Used  . Tobacco comment: quit 28 yrs ago  Substance and Sexual Activity  . Alcohol use: Yes    Comment: 1 glass wine per week  . Drug use: No  . Sexual activity: Never  Lifestyle  . Physical activity    Days per week: Not on file    Minutes per session: Not on file  . Stress: Not on file  Relationships  . Social Herbalist on phone: Not on  file    Gets together: Not on file    Attends religious service: Not on file    Active member of club or organization: Not on file    Attends meetings of clubs or organizations: Not on file    Relationship status: Not on file  . Intimate partner violence    Fear of current or ex partner: Not on file    Emotionally abused: Not on file    Physically abused: Not on file    Forced sexual activity: Not on file  Other Topics Concern  . Not  on file  Social History Narrative  . Not on file    Review of Systems  Constitutional: Negative for fatigue and unexpected weight change.  Eyes: Negative for visual disturbance.  Respiratory: Negative for cough, chest tightness and shortness of breath.   Cardiovascular: Negative for chest pain, palpitations and leg swelling.  Gastrointestinal: Negative for abdominal pain, blood in stool, nausea and vomiting.  Neurological: Negative for dizziness, tremors, facial asymmetry, speech difficulty, weakness, light-headedness and headaches.       Objective:   Physical Exam Vitals signs reviewed.  Constitutional:      Appearance: He is well-developed.  HENT:     Head: Normocephalic and atraumatic.  Eyes:     Pupils: Pupils are equal, round, and reactive to light.  Neck:     Vascular: No carotid bruit or JVD.  Cardiovascular:     Rate and Rhythm: Normal rate and regular rhythm.     Heart sounds: Normal heart sounds. No murmur.  Pulmonary:     Effort: Pulmonary effort is normal.     Breath sounds: Normal breath sounds. No rales.  Skin:    General: Skin is warm and dry.  Neurological:     Mental Status: He is alert and oriented to person, place, and time.    Vitals:   12/31/18 1316  BP: (!) 166/99  Pulse: (!) 105  Resp: 14  Temp: 99.1 F (37.3 C)  TempSrc: Oral  SpO2: 98%  Weight: 196 lb (88.9 kg)          Assessment & Plan:   Andrew Tanner is a 68 y.o. male Type 2 diabetes mellitus with hyperglycemia, without long-term  current use of insulin ([place]) - Plan: POCT glucose (manual entry), Microalbumin / creatinine urine ratio, Hemoglobin A1c, empagliflozin (JARDIANCE) 10 MG TABS tablet, glipiZIDE (GLUCOTROL) 5 MG tablet Nonadherence to medication   -Unfortunately has been off of medication.  Potential risk of nonadherence and complications of diabetes were discussed.  Denies acute symptoms indicating DKA at present.  ER precautions given with potential symptoms.  Labs obtained  -Restart Jardiance, Glucotrol, metformin at 500 mg 3 times daily dosing.  Although that is not typical dosing he did tolerate that better than 850 twice daily.  -Check labs, office visit in 1 month with home readings for next few weeks by MyChart.  Likely will need additional medication, potentially insulin but he would like to avoid that if possible.    Hyperlipidemia, unspecified hyperlipidemia type - Plan: atorvastatin (LIPITOR) 20 MG tablet, Lipid Panel, Comprehensive metabolic panel  -Baseline testing, restart Lipitor, anticipate elevated readings off meds  Essential hypertension - Plan: lisinopril-hydrochlorothiazide (ZESTORETIC) 20-25 MG tablet, Comprehensive metabolic panel  -Uncontrolled off meds, asymptomatic, restart previous dose Zestoretic with close follow-up.  Need for prophylactic vaccination and inoculation against influenza - Plan: Flu Vaccine QUAD High Dose(Fluad)  Need for prophylactic vaccination against Streptococcus pneumoniae (pneumococcus) - Plan: Pneumococcal polysaccharide vaccine 23-valent greater than or equal to 2yo subcutaneous/IM  Peripheral polyneuropathy - Plan: gabapentin (NEURONTIN) 300 MG capsule  -Increase symptoms with hyperglycemia, previously stable with gabapentin, refill same.  Meds ordered this encounter  Medications  . atorvastatin (LIPITOR) 20 MG tablet    Sig: Take 1 tablet (20 mg total) by mouth daily at 6 PM.    Dispense:  90 tablet    Refill:  1  . lisinopril-hydrochlorothiazide  (ZESTORETIC) 20-25 MG tablet    Sig: Take 1 tablet by mouth daily.    Dispense:  90 tablet  Refill:  1  . gabapentin (NEURONTIN) 300 MG capsule    Sig: Take 1 capsule (300 mg total) by mouth 2 (two) times daily.    Dispense:  180 capsule    Refill:  0  . empagliflozin (JARDIANCE) 10 MG TABS tablet    Sig: Take 10 mg by mouth daily.    Dispense:  90 tablet    Refill:  1  . glipiZIDE (GLUCOTROL) 5 MG tablet    Sig: Take 1 tablet (5 mg total) by mouth 2 (two) times daily before a meal.    Dispense:  180 tablet    Refill:  1  . metFORMIN (GLUCOPHAGE) 500 MG tablet    Sig: Take 1 tablet (500 mg total) by mouth 3 (three) times daily.    Dispense:  270 tablet    Refill:  0   Patient Instructions    Restart previous medications including metformin 3 times a day as you tolerated that better.  Check blood sugars starting this weekend once per day either fasting or 2 hours after meals and send this to me in the next week.  Recheck in the office in 1 month to decide on possible further changes of medications.  If you have any new or worsening symptoms, be seen right away especially if blood sugar is elevated.  Return to the clinic or go to the nearest emergency room if any of your symptoms worsen or new symptoms occur.    If you have lab work done today you will be contacted with your lab results within the next 2 weeks.  If you have not heard from [place] then please contact us. The fastest way to get your results is to register for My Chart.   IF you received an x-ray today, you will receive an invoice from [facility]. Please contact [facility] at [phone] with questions or concerns regarding your invoice.   IF you received labwork today, you will receive an invoice from [facility]. Please contact [facility] at [phone] with questions or concerns regarding your invoice.   Our billing staff will not be able to assist you with questions regarding bills from these  companies.  You will be contacted with the lab results as soon as they are available. The fastest way to get your results is to activate your My Chart account. Instructions are located on the last page of this paperwork. If you have not heard from [place] regarding the results in 2 weeks, please contact this office.      Signed,   [name], MD [facility].  01/01/19 10:25 AM

## 2018-12-31 NOTE — Patient Instructions (Addendum)
  Restart previous medications including metformin 3 times a day as you tolerated that better.  Check blood sugars starting this weekend once per day either fasting or 2 hours after meals and send this to me in the next week.  Recheck in the office in 1 month to decide on possible further changes of medications.  If you have any new or worsening symptoms, be seen right away especially if blood sugar is elevated.  Return to the clinic or go to the nearest emergency room if any of your symptoms worsen or new symptoms occur.    If you have lab work done today you will be contacted with your lab results within the next 2 weeks.  If you have not heard from [place] then please contact us. The fastest way to get your results is to register for My Chart.   IF you received an x-ray today, you will receive an invoice from [facility]. Please contact [facility] at [phone] with questions or concerns regarding your invoice.   IF you received labwork today, you will receive an invoice from [facility]. Please contact [facility] at [phone] with questions or concerns regarding your invoice.   Our billing staff will not be able to assist you with questions regarding bills from these companies.  You will be contacted with the lab results as soon as they are available. The fastest way to get your results is to activate your My Chart account. Instructions are located on the last page of this paperwork. If you have not heard from [place] regarding the results in 2 weeks, please contact this office.

## 2019-01-01 ENCOUNTER — Encounter: Payer: Self-pay | Admitting: Family Medicine

## 2019-01-01 LAB — COMPREHENSIVE METABOLIC PANEL
ALT: 19 IU/L (ref 0–44)
AST: 16 IU/L (ref 0–40)
Albumin/Globulin Ratio: 2.2 (ref 1.2–2.2)
Albumin: 4.1 g/dL (ref 3.8–4.8)
Alkaline Phosphatase: 73 IU/L (ref 39–117)
BUN/Creatinine Ratio: 19 (ref 10–24)
BUN: 15 mg/dL (ref 8–27)
Bilirubin Total: 0.3 mg/dL (ref 0.0–1.2)
CO2: 20 mmol/L (ref 20–29)
Calcium: 9.7 mg/dL (ref 8.6–10.2)
Chloride: 98 mmol/L (ref 96–106)
Creatinine, Ser: 0.79 mg/dL (ref 0.76–1.27)
GFR calc Af Amer: 106 mL/min/{1.73_m2} (ref >59)
GFR calc non Af Amer: 92 mL/min/{1.73_m2} (ref >59)
Globulin, Total: 1.9 g/dL (ref 1.5–4.5)
Glucose: 315 mg/dL — ABNORMAL HIGH (ref 65–99)
Potassium: 4.2 mmol/L (ref 3.5–5.2)
Sodium: 133 mmol/L — ABNORMAL LOW (ref 134–144)
Total Protein: 6 g/dL (ref 6.0–8.5)

## 2019-01-01 LAB — LIPID PANEL
Chol/HDL Ratio: 7.4 ratio — ABNORMAL HIGH (ref 0.0–5.0)
Cholesterol, Total: 281 mg/dL — ABNORMAL HIGH (ref 100–199)
HDL: 38 mg/dL — ABNORMAL LOW (ref >39)
LDL Chol Calc (NIH): 194 mg/dL — ABNORMAL HIGH (ref 0–99)
Triglycerides: 253 mg/dL — ABNORMAL HIGH (ref 0–149)
VLDL Cholesterol Cal: 49 mg/dL — ABNORMAL HIGH (ref 5–40)

## 2019-01-01 LAB — HEMOGLOBIN A1C
Est. average glucose Bld gHb Est-mCnc: 301 mg/dL
Hgb A1c MFr Bld: 12.1 % — ABNORMAL HIGH (ref 4.8–5.6)

## 2019-01-01 LAB — MICROALBUMIN / CREATININE URINE RATIO
Creatinine, Urine: 65.1 mg/dL
Microalb/Creat Ratio: 27 mg/g creat (ref 0–29)
Microalbumin, Urine: 17.7 ug/mL

## 2019-01-02 DIAGNOSIS — F3341 Major depressive disorder, recurrent, in partial remission: Secondary | ICD-10-CM | POA: Diagnosis not present

## 2019-01-09 ENCOUNTER — Encounter: Payer: Self-pay | Admitting: Family Medicine

## 2019-01-21 ENCOUNTER — Encounter: Payer: Self-pay | Admitting: Family Medicine

## 2019-01-29 DIAGNOSIS — F3341 Major depressive disorder, recurrent, in partial remission: Secondary | ICD-10-CM | POA: Diagnosis not present

## 2019-01-30 ENCOUNTER — Encounter: Payer: Self-pay | Admitting: Family Medicine

## 2019-01-30 ENCOUNTER — Ambulatory Visit (INDEPENDENT_AMBULATORY_CARE_PROVIDER_SITE_OTHER): Payer: Medicare Other | Admitting: Family Medicine

## 2019-01-30 ENCOUNTER — Other Ambulatory Visit: Payer: Self-pay

## 2019-01-30 VITALS — BP 117/72 | HR 95 | Temp 98.7°F | Resp 14 | Wt 194.4 lb

## 2019-01-30 DIAGNOSIS — E785 Hyperlipidemia, unspecified: Secondary | ICD-10-CM

## 2019-01-30 DIAGNOSIS — E1165 Type 2 diabetes mellitus with hyperglycemia: Secondary | ICD-10-CM

## 2019-01-30 DIAGNOSIS — I10 Essential (primary) hypertension: Secondary | ICD-10-CM | POA: Diagnosis not present

## 2019-01-30 MED ORDER — JARDIANCE 25 MG PO TABS: 25.0000 mg | ORAL_TABLET | Freq: Every day | ORAL | 1 refills | Status: DC

## 2019-01-30 NOTE — Progress Notes (Signed)
Subjective:    Patient ID: Andrew Tanner, male    DOB: [birth year], 68 y.o.   MRN: [id number]  HPI Andrew Tanner is a 68 y.o. male Presents today for: Chief Complaint  Patient presents with  . Diabetes    follow up for diabetes. Blood Sugar 161-185 range   Diabetes: Complicated by hyperglycemia, neuropathy.  Medication nonadherence at last visit September 21.  No acute symptoms of DKA or hyperosmolar state at that time.  Restarted Jardiance 10 mg daily, Glucotrol 5 mg twice daily, and Metformin at previously tolerated 500 mg 3 times daily dosing(did not tolerate 850 mg twice daily in past).  Blood sugar 188 on September 30, 162 October 12 by my chart updates.  Home readings: 185 fasting, 160s after exercise. No symptomatic lows - lowest 160.  Has restarted exercise as well as cutting carbs.  Gabapentin for neuropathy - controls sx's with current dose of 347m BID.   Microalbumin: normal ratio 9/21.  Optho, foot exam, pneumovax: Due for foot exam, otherwise up-to-date. Colonoscopy discussed for health maintenance. Thinks had Cologuard last yr.  FOBT neg 12/2017.   Wt Readings from Last 3 Encounters:  01/30/19 194 lb 6.4 oz (88.2 kg)  12/31/18 196 lb (88.9 kg)  01/01/18 195 lb 9.6 oz (88.7 kg)     Lab Results  Component Value Date   HGBA1C 12.1 (H) 12/31/2018   HGBA1C 8.3 (H) 01/01/2018   HGBA1C 6.9 (H) 10/02/2017   Lab Results  Component Value Date   MICROALBUR 3.8 (H) 03/10/2014   LDLCALC 194 (H) 12/31/2018   CREATININE 0.79 12/31/2018     Hyperlipidemia:  Lab Results  Component Value Date   CHOL 281 (H) 12/31/2018   HDL 38 (L) 12/31/2018   LDLCALC 194 (H) 12/31/2018   TRIG 253 (H) 12/31/2018   CHOLHDL 7.4 (H) 12/31/2018   Lab Results  Component Value Date   ALT 19 12/31/2018   AST 16 12/31/2018   ALKPHOS 73 12/31/2018   BILITOT 0.3 12/31/2018  Baseline testing performed at September 21 visit as he had been off medications.  Restarted Lipitor 20  mg daily. No new myalgias, or side effects.   Hypertension: BP Readings from Last 3 Encounters:  01/30/19 117/72  12/31/18 (!) 166/99  01/01/18 122/80   Lab Results  Component Value Date   CREATININE 0.79 12/31/2018  Uncontrolled last visit with medication nonadherence.  Restart Zestoretic 20/25 mg daily.  No new side effects.  No home readings.     Patient Active Problem List   Diagnosis Date Noted  . Diabetes mellitus type II 06/02/2011  . Hypertension 06/02/2011  . Hyperlipemia 06/02/2011  . Cataract 06/02/2011   Past Medical History:  Diagnosis Date  . Cataract   . Diabetes [place] without [place]   . Hyperlipidemia   . Hypertension    Past Surgical History:  Procedure Laterality Date  . HERNIA REPAIR    . NECK SURGERY    . SPINE SURGERY    . TRANSURETHRAL RESECTION OF PROSTATE    . TURP VAPORIZATION     No Known Allergies Prior to Admission medications   Medication Sig Start Date End Date Taking? Authorizing Provider  ARIPiprazole (ABILIFY) 2 MG tablet Take 2 mg by mouth daily.   Yes [provider]  atorvastatin (LIPITOR) 20 MG tablet Take 1 tablet (20 mg total) by mouth daily at 6 PM. 12/31/18  Yes GWendie Agreste MD  citalopram (CELEXA) 40 MG tablet Take 40 mg by mouth  daily.   Yes [provider]  clonazePAM (KLONOPIN) 0.5 MG tablet Take 0.5 mg by mouth 3 (three) times daily as needed.   Yes [provider]  empagliflozin (JARDIANCE) 10 MG TABS tablet Take 10 mg by mouth daily. 12/31/18  Yes [name], MD  gabapentin (NEURONTIN) 300 MG capsule Take 1 capsule (300 mg total) by mouth 2 (two) times daily. 12/31/18  Yes [name], MD  glipiZIDE (GLUCOTROL) 5 MG tablet Take 1 tablet (5 mg total) by mouth 2 (two) times daily before a meal. 12/31/18  Yes [name], MD  glucose blood test strip 1 each by Other route daily. Use as instructed 07/26/17  Yes [name], MD  ibuprofen (ADVIL,MOTRIN) 800  MG tablet Take 1 tablet (800 mg total) by mouth every 8 (eight) hours as needed for pain. 01/09/12  Yes Shawnee Knapp, MD  lisinopril-hydrochlorothiazide (ZESTORETIC) 20-25 MG tablet Take 1 tablet by mouth daily. 12/31/18  Yes [name], MD  metFORMIN (GLUCOPHAGE) 500 MG tablet Take 1 tablet (500 mg total) by mouth 3 (three) times daily. 12/31/18  Yes [name], MD  Multiple Vitamin (MULTIVITAMIN) tablet Take 1 tablet by mouth daily.   Yes [provider]   Social History   Socioeconomic History  . Marital status: Married    Spouse name: [name]  . Number of children: Not on file  . Years of education: Not on file  . Highest education level: Not on file  Occupational History  . Not on file  Social Needs  . Financial resource strain: Not on file  . Food insecurity    Worry: Not on file    Inability: Not on file  . Transportation needs    Medical: Not on file    Non-medical: Not on file  Tobacco Use  . Smoking status: Former Research scientist (life sciences)  . Smokeless tobacco: Never Used  . Tobacco comment: quit 28 yrs ago  Substance and Sexual Activity  . Alcohol use: Yes    Comment: 1 glass wine per week  . Drug use: No  . Sexual activity: Never  Lifestyle  . Physical activity    Days per week: Not on file    Minutes per session: Not on file  . Stress: Not on file  Relationships  . Social Herbalist on phone: Not on file    Gets together: Not on file    Attends religious service: Not on file    Active member of club or organization: Not on file    Attends meetings of clubs or organizations: Not on file    Relationship status: Not on file  . Intimate partner violence    Fear of current or ex partner: Not on file    Emotionally abused: Not on file    Physically abused: Not on file    Forced sexual activity: Not on file  Other Topics Concern  . Not on file  Social History Narrative  . Not on file    Review of Systems  Constitutional: Negative for  fatigue and unexpected weight change.  Eyes: Negative for visual disturbance.  Respiratory: Negative for cough, chest tightness and shortness of breath.   Cardiovascular: Negative for chest pain, palpitations and leg swelling.  Gastrointestinal: Negative for abdominal pain, blood in stool, nausea and vomiting.  Neurological: Negative for dizziness, light-headedness and headaches.       Objective:   Physical Exam Vitals signs reviewed.  Constitutional:  Appearance: He is well-developed.  HENT:     Head: Normocephalic and atraumatic.  Eyes:     Pupils: Pupils are equal, round, and reactive to light.  Neck:     Vascular: No carotid bruit or JVD.  Cardiovascular:     Rate and Rhythm: Normal rate and regular rhythm.     Heart sounds: Normal heart sounds. No murmur.  Pulmonary:     Effort: Pulmonary effort is normal.     Breath sounds: Normal breath sounds. No rales.  Skin:    General: Skin is warm and dry.  Neurological:     Mental Status: He is alert and oriented to person, place, and time.    Vitals:   01/30/19 1055  BP: 117/72  Pulse: 95  Resp: 14  Temp: 98.7 F (37.1 C)  TempSrc: Oral  SpO2: 97%  Weight: 194 lb 6.4 oz (88.2 kg)       Assessment & Plan:  Hondo Nanda is a 68 y.o. male Type 2 diabetes mellitus with hyperglycemia, without long-term current use of insulin ([place])  Essential hypertension  Hyperlipidemia, unspecified hyperlipidemia type  Improving home glucose readings.  Still elevated.  Plan to increase Jardiance to 25 mg, but will double his 10 mg for now until those run out.  Continue other regimen with recheck A1c in 6 weeks.  Tolerating restart of other meds with improved BP control.  Meds ordered this encounter  Medications  . empagliflozin (JARDIANCE) 25 MG TABS tablet    Sig: Take 25 mg by mouth daily before breakfast.    Dispense:  90 tablet    Refill:  1   Patient Instructions   Increase the jardiance to 2 pills for now and  then start 63m when those run out.  No other changes for now. Keep up the good work with exercise and diet changes! Tylenol on occasion ok if needed for occasional aches and pains, but please schedule appointment if back pain is persistent/limiting exercise.   Recheck in 6 weeks.   Type 2 Diabetes Mellitus, Self Care, Adult When you have type 2 diabetes (type 2 diabetes mellitus), you must make sure your blood sugar (glucose) stays in a healthy range. You can do this with:  Nutrition.  Exercise.  Lifestyle changes.  Medicines or insulin, if needed.  Support from your doctors and others. How to stay aware of blood sugar   Check your blood sugar level every day, as often as told.  Have your A1c (hemoglobin A1c) level checked two or more times a year. Have it checked more often if your doctor tells you to. Your doctor will set personal treatment goals for you. Generally, you should have these blood sugar levels:  Before meals (preprandial): 80-130 mg/dL (4.4-7.2 mmol/L).  After meals (postprandial): below 180 mg/dL (10 mmol/L).  A1c level: less than 7%. How to manage high and low blood sugar Signs of high blood sugar High blood sugar is called hyperglycemia. Know the signs of high blood sugar. Signs may include:  Feeling: ? Thirsty. ? Hungry. ? Very tired.  Needing to pee (urinate) more than usual.  Blurry vision. Signs of low blood sugar Low blood sugar is called hypoglycemia. This is when blood sugar is at or below 70 mg/dL (3.9 mmol/L). Signs may include:  Feeling: ? Hungry. ? Worried or nervous (anxious). ? Sweaty and clammy. ? Confused. ? Dizzy. ? Sleepy. ? Sick to your stomach (nauseous).  Having: ? A fast heartbeat. ? A headache. ? A  change in your vision. ? Jerky movements that you cannot control (seizure). ? Tingling or no feeling (numbness) around your mouth, lips, or tongue.  Having trouble with: ? Moving (coordination). ? Sleeping. ? Passing  out (fainting). ? Getting upset easily (irritability). Treating low blood sugar To treat low blood sugar, eat or drink something sugary right away. If you can think clearly and swallow safely, follow the 15:15 rule:  Take 15 grams of a fast-acting carb (carbohydrate). Talk with your doctor about how much you should take.  Some fast-acting carbs are: ? Sugar tablets (glucose pills). Take 3-4 pills. ? 6-8 pieces of hard candy. ? 4-6 oz (120-150 mL) of fruit juice. ? 4-6 oz (120-150 mL) of regular (not diet) soda. ? 1 Tbsp (15 mL) honey or sugar.  Check your blood sugar 15 minutes after you take the carb.  If your blood sugar is still at or below 70 mg/dL (3.9 mmol/L), take 15 grams of a carb again.  If your blood sugar does not go above 70 mg/dL (3.9 mmol/L) after 3 tries, get help right away.  After your blood sugar goes back to normal, eat a meal or a snack within 1 hour. Treating very low blood sugar If your blood sugar is at or below 54 mg/dL (3 mmol/L), you have very low blood sugar (severe hypoglycemia). This is an emergency. Do not wait to see if the symptoms will go away. Get medical help right away. Call your local emergency services (911 in the [place] If you have very low blood sugar and you cannot eat or drink, you may need a glucagon shot (injection). A family member or friend should learn how to check your blood sugar and how to give you a glucagon shot. Ask your doctor if you need to have a glucagon shot kit at home. Follow these instructions at home: Medicine  Take insulin and diabetes medicines as told.  If your doctor says you should take more or less insulin and medicines, do this exactly as told.  Do not run out of insulin or medicines. Having diabetes can raise your risk for other long-term conditions. These include heart disease and kidney disease. Your doctor may prescribe medicines to help you not have these problems. Food   Make healthy food choices. These  include: ? Chicken, fish, egg whites, and beans. ? Oats, whole wheat, bulgur, brown rice, quinoa, and millet. ? Fresh fruits and vegetables. ? Low-fat dairy products. ? Nuts, avocado, olive oil, and canola oil.  Meet with a food specialist (dietitian). He or she can help you make an eating plan that is right for you.  Follow instructions from your doctor about what you cannot eat or drink.  Drink enough fluid to keep your pee (urine) pale yellow.  Keep track of carbs that you eat. Do this by reading food labels and learning food serving sizes.  Follow your sick day plan when you cannot eat or drink normally. Make this plan with your doctor so it is ready to use. Activity  Exercise 3 or more times a week.  Do not go more than 2 days without exercising.  Talk with your doctor before you start a new exercise. Your doctor may need to tell you to change: ? How much insulin or medicines you take. ? How much food you eat. Lifestyle  Do not use any tobacco products. These include cigarettes, chewing tobacco, and e-cigarettes. If you need help quitting, ask your doctor.  Ask your doctor  how much alcohol is safe for you.  Learn to deal with stress. If you need help with this, ask your doctor. Body care   Stay up to date with your shots (immunizations).  Have your eyes and feet checked by a doctor as often as told.  Check your skin and feet every day. Check for cuts, bruises, redness, blisters, or sores.  Brush your teeth and gums two times a day. Floss one or more times a day.  Go to the dentist one or more times every 6 months.  Stay at a healthy weight. General instructions  Take over-the-counter and prescription medicines only as told by your doctor.  Share your diabetes care plan with: ? Your work or school. ? People you live with.  Carry a card or wear jewelry that says you have diabetes.  Keep all follow-up visits as told by your doctor. This is important.  Questions to ask your doctor  Do I need to meet with a diabetes educator?  Where can I find a support group for people with diabetes? Where to find more information To learn more about diabetes, visit:  American Diabetes Association: www.diabetes.org  American Association of Diabetes Educators: www.diabeteseducator.org Summary  When you have type 2 diabetes, you must make sure your blood sugar (glucose) stays in a healthy range.  Check your blood sugar every day, as often as told.  Having diabetes can raise your risk for other conditions. Your doctor may prescribe medicines to help you not have these problems.  Keep all follow-up visits as told by your doctor. This is important. This information is not intended to replace advice given to you by your health care provider. Make sure you discuss any questions you have with your health care provider. Document Released: 07/20/2015 Document Revised: 09/18/2017 Document Reviewed: 05/01/2015 Elsevier Patient Education  [employer].     If you have lab work done today you will be contacted with your lab results within the next 2 weeks.  If you have not heard from [place] then please contact us. The fastest way to get your results is to register for My Chart.   IF you received an x-ray today, you will receive an invoice from [facility]. Please contact [facility] at [phone] with questions or concerns regarding your invoice.   IF you received labwork today, you will receive an invoice from [place]. Please contact [facility] at [phone] with questions or concerns regarding your invoice.   Our billing staff will not be able to assist you with questions regarding bills from these companies.  You will be contacted with the lab results as soon as they are available. The fastest way to get your results is to activate your My Chart account. Instructions are located on the last page of this paperwork. If you have not  heard from [place] regarding the results in 2 weeks, please contact this office.       Signed,   [name], MD [facility] [place].  01/30/19 6:28 PM

## 2019-01-30 NOTE — Patient Instructions (Addendum)
Increase the jardiance to 2 pills for now and then start 67m when those run out.  No other changes for now. Keep up the good work with exercise and diet changes! Tylenol on occasion ok if needed for occasional aches and pains, but please schedule appointment if back pain is persistent/limiting exercise.   Recheck in 6 weeks.   Type 2 Diabetes Mellitus, Self Care, Adult When you have type 2 diabetes (type 2 diabetes mellitus), you must make sure your blood sugar (glucose) stays in a healthy range. You can do this with:  Nutrition.  Exercise.  Lifestyle changes.  Medicines or insulin, if needed.  Support from your doctors and others. How to stay aware of blood sugar   Check your blood sugar level every day, as often as told.  Have your A1c (hemoglobin A1c) level checked two or more times a year. Have it checked more often if your doctor tells you to. Your doctor will set personal treatment goals for you. Generally, you should have these blood sugar levels:  Before meals (preprandial): 80-130 mg/dL (4.4-7.2 mmol/L).  After meals (postprandial): below 180 mg/dL (10 mmol/L).  A1c level: less than 7%. How to manage high and low blood sugar Signs of high blood sugar High blood sugar is called hyperglycemia. Know the signs of high blood sugar. Signs may include:  Feeling: ? Thirsty. ? Hungry. ? Very tired.  Needing to pee (urinate) more than usual.  Blurry vision. Signs of low blood sugar Low blood sugar is called hypoglycemia. This is when blood sugar is at or below 70 mg/dL (3.9 mmol/L). Signs may include:  Feeling: ? Hungry. ? Worried or nervous (anxious). ? Sweaty and clammy. ? Confused. ? Dizzy. ? Sleepy. ? Sick to your stomach (nauseous).  Having: ? A fast heartbeat. ? A headache. ? A change in your vision. ? Jerky movements that you cannot control (seizure). ? Tingling or no feeling (numbness) around your mouth, lips, or tongue.  Having trouble  with: ? Moving (coordination). ? Sleeping. ? Passing out (fainting). ? Getting upset easily (irritability). Treating low blood sugar To treat low blood sugar, eat or drink something sugary right away. If you can think clearly and swallow safely, follow the 15:15 rule:  Take 15 grams of a fast-acting carb (carbohydrate). Talk with your doctor about how much you should take.  Some fast-acting carbs are: ? Sugar tablets (glucose pills). Take 3-4 pills. ? 6-8 pieces of hard candy. ? 4-6 oz (120-150 mL) of fruit juice. ? 4-6 oz (120-150 mL) of regular (not diet) soda. ? 1 Tbsp (15 mL) honey or sugar.  Check your blood sugar 15 minutes after you take the carb.  If your blood sugar is still at or below 70 mg/dL (3.9 mmol/L), take 15 grams of a carb again.  If your blood sugar does not go above 70 mg/dL (3.9 mmol/L) after 3 tries, get help right away.  After your blood sugar goes back to normal, eat a meal or a snack within 1 hour. Treating very low blood sugar If your blood sugar is at or below 54 mg/dL (3 mmol/L), you have very low blood sugar (severe hypoglycemia). This is an emergency. Do not wait to see if the symptoms will go away. Get medical help right away. Call your local emergency services (911 in the [place] If you have very low blood sugar and you cannot eat or drink, you may need a glucagon shot (injection). A family member or friend should learn  how to check your blood sugar and how to give you a glucagon shot. Ask your doctor if you need to have a glucagon shot kit at home. Follow these instructions at home: Medicine  Take insulin and diabetes medicines as told.  If your doctor says you should take more or less insulin and medicines, do this exactly as told.  Do not run out of insulin or medicines. Having diabetes can raise your risk for other long-term conditions. These include heart disease and kidney disease. Your doctor may prescribe medicines to help you not have these  problems. Food   Make healthy food choices. These include: ? Chicken, fish, egg whites, and beans. ? Oats, whole wheat, bulgur, brown rice, quinoa, and millet. ? Fresh fruits and vegetables. ? Low-fat dairy products. ? Nuts, avocado, olive oil, and canola oil.  Meet with a food specialist (dietitian). He or she can help you make an eating plan that is right for you.  Follow instructions from your doctor about what you cannot eat or drink.  Drink enough fluid to keep your pee (urine) pale yellow.  Keep track of carbs that you eat. Do this by reading food labels and learning food serving sizes.  Follow your sick day plan when you cannot eat or drink normally. Make this plan with your doctor so it is ready to use. Activity  Exercise 3 or more times a week.  Do not go more than 2 days without exercising.  Talk with your doctor before you start a new exercise. Your doctor may need to tell you to change: ? How much insulin or medicines you take. ? How much food you eat. Lifestyle  Do not use any tobacco products. These include cigarettes, chewing tobacco, and e-cigarettes. If you need help quitting, ask your doctor.  Ask your doctor how much alcohol is safe for you.  Learn to deal with stress. If you need help with this, ask your doctor. Body care   Stay up to date with your shots (immunizations).  Have your eyes and feet checked by a doctor as often as told.  Check your skin and feet every day. Check for cuts, bruises, redness, blisters, or sores.  Brush your teeth and gums two times a day. Floss one or more times a day.  Go to the dentist one or more times every 6 months.  Stay at a healthy weight. General instructions  Take over-the-counter and prescription medicines only as told by your doctor.  Share your diabetes care plan with: ? Your work or school. ? People you live with.  Carry a card or wear jewelry that says you have diabetes.  Keep all follow-up  visits as told by your doctor. This is important. Questions to ask your doctor  Do I need to meet with a diabetes educator?  Where can I find a support group for people with diabetes? Where to find more information To learn more about diabetes, visit:  American Diabetes Association: www.diabetes.org  American Association of Diabetes Educators: www.diabeteseducator.org Summary  When you have type 2 diabetes, you must make sure your blood sugar (glucose) stays in a healthy range.  Check your blood sugar every day, as often as told.  Having diabetes can raise your risk for other conditions. Your doctor may prescribe medicines to help you not have these problems.  Keep all follow-up visits as told by your doctor. This is important. This information is not intended to replace advice given to you by your health  care provider. Make sure you discuss any questions you have with your health care provider. Document Released: 07/20/2015 Document Revised: 09/18/2017 Document Reviewed: 05/01/2015 Elsevier Patient Education  [employer].     If you have lab work done today you will be contacted with your lab results within the next 2 weeks.  If you have not heard from [place] then please contact us. The fastest way to get your results is to register for My Chart.   IF you received an x-ray today, you will receive an invoice from [facility]. Please contact [facility] at [phone] with questions or concerns regarding your invoice.   IF you received labwork today, you will receive an invoice from [place]. Please contact [facility] at [phone] with questions or concerns regarding your invoice.   Our billing staff will not be able to assist you with questions regarding bills from these companies.  You will be contacted with the lab results as soon as they are available. The fastest way to get your results is to activate your My Chart account. Instructions are located on  the last page of this paperwork. If you have not heard from [place] regarding the results in 2 weeks, please contact this office.

## 2019-02-08 DIAGNOSIS — F3341 Major depressive disorder, recurrent, in partial remission: Secondary | ICD-10-CM | POA: Diagnosis not present

## 2019-03-12 DIAGNOSIS — F3341 Major depressive disorder, recurrent, in partial remission: Secondary | ICD-10-CM | POA: Diagnosis not present

## 2019-03-13 ENCOUNTER — Ambulatory Visit (INDEPENDENT_AMBULATORY_CARE_PROVIDER_SITE_OTHER): Payer: Medicare Other | Admitting: Family Medicine

## 2019-03-13 ENCOUNTER — Encounter: Payer: Self-pay | Admitting: Family Medicine

## 2019-03-13 ENCOUNTER — Other Ambulatory Visit: Payer: Self-pay

## 2019-03-13 VITALS — BP 135/77 | HR 89 | Temp 98.4°F | Wt 194.0 lb

## 2019-03-13 DIAGNOSIS — E1165 Type 2 diabetes mellitus with hyperglycemia: Secondary | ICD-10-CM | POA: Diagnosis not present

## 2019-03-13 DIAGNOSIS — I10 Essential (primary) hypertension: Secondary | ICD-10-CM | POA: Diagnosis not present

## 2019-03-13 DIAGNOSIS — E785 Hyperlipidemia, unspecified: Secondary | ICD-10-CM

## 2019-03-13 LAB — GLUCOSE, POCT (MANUAL RESULT ENTRY): POC Glucose: 313 mg/dl — AB (ref 70–99)

## 2019-03-13 NOTE — Patient Instructions (Addendum)
   Ask your pharmacist if other med in [place] class that may be less expensive. Good Rx or other discount program may also help.   Keep up the GREAT work with watching diet and carbs.     If you have lab work done today you will be contacted with your lab results within the next 2 weeks.  If you have not heard from [place] then please contact us. The fastest way to get your results is to register for My Chart.   IF you received an x-ray today, you will receive an invoice from [facility]. Please contact [facility] at [phone] with questions or concerns regarding your invoice.   IF you received labwork today, you will receive an invoice from [place]. Please contact [facility] at [phone] with questions or concerns regarding your invoice.   Our billing staff will not be able to assist you with questions regarding bills from these companies.  You will be contacted with the lab results as soon as they are available. The fastest way to get your results is to activate your My Chart account. Instructions are located on the last page of this paperwork. If you have not heard from [place] regarding the results in 2 weeks, please contact this office.

## 2019-03-13 NOTE — Progress Notes (Signed)
Subjective:  Patient ID: Dantae Leisher, male    DOB: [birth year]  Age: 68 y.o. MRN: [id number]  CC:  Chief Complaint  Patient presents with   Diabetes     6week f/u on diabetes and labs. Glucose has been averging 204-130    HPI Gevon Spradley presents for   Diabetes: Complicated by hyperglycemia, neuropathy.   Medication nonadherence at September 21 visit.  Restarted Jardiance initially 10 mg daily, Glipizide - taking 2 in am with breakfast. , metformin 500 mg 3 times daily as he did not tolerate 850 mg twice daily.  Persistent elevated readings in October 21 visit, Jardiance increased to 25 mg daily.  Had restarted exercise as well as cutting carbs at that time.  Gabapentin was controlling symptoms at 300 mg twice daily. He is on ACE inhibitor as well as statin.  Home readings: Fastings: 130s, lowest 107, no symptomatic lows.  2 hour PP - 160 up to 200.  Has continued to lose weight, watching diet. Waistline improved. Cutting carbs.   Wt Readings from Last 3 Encounters:  03/13/19 194 lb (88 kg)  01/30/19 194 lb 6.4 oz (88.2 kg)  12/31/18 196 lb (88.9 kg)   Hyperlipidemia: Restarted Liptor 20mg   in September. No new myalgias.   Lab Results  Component Value Date   CHOL 281 (H) 12/31/2018   HDL 38 (L) 12/31/2018   LDLCALC 194 (H) 12/31/2018   TRIG 253 (H) 12/31/2018   CHOLHDL 7.4 (H) 12/31/2018   Lab Results  Component Value Date   ALT 19 12/31/2018   AST 16 12/31/2018   ALKPHOS 73 12/31/2018   BILITOT 0.3 12/31/2018        Lab Results  Component Value Date   HGBA1C 12.1 (H) 12/31/2018   HGBA1C 8.3 (H) 01/01/2018   HGBA1C 6.9 (H) 10/02/2017   Lab Results  Component Value Date   MICROALBUR 3.8 (H) 03/10/2014   LDLCALC 194 (H) 12/31/2018   CREATININE 0.79 12/31/2018    History Patient Active Problem List   Diagnosis Date Noted   Diabetes mellitus type II 06/02/2011   Hypertension 06/02/2011   Hyperlipemia 06/02/2011   Cataract 06/02/2011     Past Medical History:  Diagnosis Date   Cataract    Diabetes mellitus without complication ()    Hyperlipidemia    Hypertension    Past Surgical History:  Procedure Laterality Date   HERNIA REPAIR     NECK SURGERY     SPINE SURGERY     TRANSURETHRAL RESECTION OF PROSTATE     TURP VAPORIZATION     No Known Allergies Prior to Admission medications   Medication Sig Start Date End Date Taking? Authorizing Provider  ARIPiprazole (ABILIFY) 2 MG tablet Take 2 mg by mouth daily.   Yes [provider]  atorvastatin (LIPITOR) 20 MG tablet Take 1 tablet (20 mg total) by mouth daily at 6 PM. 12/31/18  Yes [name], MD  citalopram (CELEXA) 40 MG tablet Take 40 mg by mouth daily.   Yes [provider]  clonazePAM (KLONOPIN) 0.5 MG tablet Take 0.5 mg by mouth 3 (three) times daily as needed.   Yes [provider]  empagliflozin (JARDIANCE) 25 MG TABS tablet Take 25 mg by mouth daily before breakfast. 01/30/19  Yes [name], MD  gabapentin (NEURONTIN) 300 MG capsule Take 1 capsule (300 mg total) by mouth 2 (two) times daily. 12/31/18  Yes [name], MD  glipiZIDE (GLUCOTROL) 5 MG tablet Take 1  tablet (5 mg total) by mouth 2 (two) times daily before a meal. 12/31/18  Yes [name], MD  glucose blood test strip 1 each by Other route daily. Use as instructed 07/26/17  Yes [name], MD  ibuprofen (ADVIL,MOTRIN) 800 MG tablet Take 1 tablet (800 mg total) by mouth every 8 (eight) hours as needed for pain. 01/09/12  Yes Shawnee Knapp, MD  lisinopril-hydrochlorothiazide (ZESTORETIC) 20-25 MG tablet Take 1 tablet by mouth daily. 12/31/18  Yes [name], MD  metFORMIN (GLUCOPHAGE) 500 MG tablet Take 1 tablet (500 mg total) by mouth 3 (three) times daily. 12/31/18  Yes [name], MD  Multiple Vitamin (MULTIVITAMIN) tablet Take 1 tablet by mouth daily.   Yes [provider]   Social History   Socioeconomic  History   Marital status: Married    Spouse name: [name]   Number of children: Not on file   Years of education: Not on file   Highest education level: Not on file  Occupational History   Not on file  Social Needs   Financial resource strain: Not on file   Food insecurity    Worry: Not on file    Inability: Not on file   Transportation needs    Medical: Not on file    Non-medical: Not on file  Tobacco Use   Smoking status: Former Smoker   Smokeless tobacco: Never Used   Tobacco comment: quit 28 yrs ago  Substance and Sexual Activity   Alcohol use: Yes    Comment: 1 glass wine per week   Drug use: No   Sexual activity: Never  Lifestyle   Physical activity    Days per week: Not on file    Minutes per session: Not on file   Stress: Not on file  Relationships   Social connections    Talks on phone: Not on file    Gets together: Not on file    Attends religious service: Not on file    Active member of club or organization: Not on file    Attends meetings of clubs or organizations: Not on file    Relationship status: Not on file   Intimate partner violence    Fear of current or ex partner: Not on file    Emotionally abused: Not on file    Physically abused: Not on file    Forced sexual activity: Not on file  Other Topics Concern   Not on file  Social History Narrative   Not on file    Review of Systems  Constitutional: Negative for fatigue and unexpected weight change.  Eyes: Negative for visual disturbance.  Respiratory: Negative for cough, chest tightness and shortness of breath.   Cardiovascular: Negative for chest pain, palpitations and leg swelling.  Gastrointestinal: Negative for abdominal pain and blood in stool.  Neurological: Negative for dizziness, light-headedness and headaches.  All other systems reviewed and are negative.    Objective:   Vitals:   03/13/19 0857  BP: 135/77  Pulse: 89  Temp: 98.4 F (36.9 C)    TempSrc: Oral  SpO2: 99%  Weight: 194 lb (88 kg)     Physical Exam Vitals signs reviewed.  Constitutional:      Appearance: He is well-developed.  HENT:     Head: Normocephalic and atraumatic.     Right Ear: Tympanic membrane, ear canal and external ear normal.     Left Ear: Tympanic membrane, ear canal and external ear  normal.     Nose: No rhinorrhea.     Mouth/Throat:     Pharynx: No oropharyngeal exudate or posterior oropharyngeal erythema.  Eyes:     Conjunctiva/sclera: Conjunctivae normal.     Pupils: Pupils are equal, round, and reactive to light.  Neck:     Musculoskeletal: Neck supple.  Cardiovascular:     Rate and Rhythm: Normal rate and regular rhythm.     Heart sounds: Normal heart sounds. No murmur.  Pulmonary:     Effort: Pulmonary effort is normal.     Breath sounds: Normal breath sounds. No wheezing, rhonchi or rales.  Abdominal:     Palpations: Abdomen is soft.     Tenderness: There is no abdominal tenderness.  Lymphadenopathy:     Cervical: No cervical adenopathy.  Skin:    General: Skin is warm and dry.     Findings: No rash.  Neurological:     Mental Status: He is alert and oriented to person, place, and time.  Psychiatric:        Behavior: Behavior normal.     Results for orders placed or performed in visit on 03/13/19  POCT glucose (manual entry)  Result Value Ref Range   POC Glucose 313 (A) 70 - 99 mg/dl      Assessment & Plan:  Andin Hafey is a 68 y.o. male . Type 2 diabetes mellitus with hyperglycemia, without long-term current use of insulin (HCC) - Plan: Hemoglobin A1c, POCT glucose (manual entry)  - commended on improved diet. Check A1c, continue same regimen for now, but can discuss less expensive options with pharmacist or good Rx/discount program.   - in office testing not consistent with home readings - will check A1c, then determine changes - potentially insulin. Can also adjust meds if needed for cost reasons. Will call  and discuss with patient.   Called pt - feels well, no blurry vision/n/v. Will check home readings, and if new symptoms should be seen to verify accuracy of meter.   Essential hypertension - Plan: Comprehensive metabolic panel  -  Stable, tolerating current regimen. Labs pending as above.   Hyperlipidemia, unspecified hyperlipidemia type - Plan: Lipid Panel  -  Stable, tolerating current regimen.updated Labs pending as above.    No orders of the defined types were placed in this encounter.  Patient Instructions     Ask your pharmacist if other med in [place] class that may be less expensive. Good Rx or other discount program may also help.   Keep up the GREAT work with watching diet and carbs.     If you have lab work done today you will be contacted with your lab results within the next 2 weeks.  If you have not heard from [place] then please contact us. The fastest way to get your results is to register for My Chart.   IF you received an x-ray today, you will receive an invoice from [facility]. Please contact [facility] at [phone] with questions or concerns regarding your invoice.   IF you received labwork today, you will receive an invoice from [place]. Please contact [facility] at [phone] with questions or concerns regarding your invoice.   Our billing staff will not be able to assist you with questions regarding bills from these companies.  You will be contacted with the lab results as soon as they are available. The fastest way to get your results is to activate your My Chart account. Instructions are located on the last page  of this paperwork. If you have not heard from [place] regarding the results in 2 weeks, please contact this office.          Signed, [name], MD [facility]

## 2019-03-14 LAB — COMPREHENSIVE METABOLIC PANEL
ALT: 31 IU/L (ref 0–44)
AST: 21 IU/L (ref 0–40)
Albumin/Globulin Ratio: 2 (ref 1.2–2.2)
Albumin: 4.2 g/dL (ref 3.8–4.8)
Alkaline Phosphatase: 76 IU/L (ref 39–117)
BUN/Creatinine Ratio: 22 (ref 10–24)
BUN: 20 mg/dL (ref 8–27)
Bilirubin Total: 0.4 mg/dL (ref 0.0–1.2)
CO2: 20 mmol/L (ref 20–29)
Calcium: 10 mg/dL (ref 8.6–10.2)
Chloride: 97 mmol/L (ref 96–106)
Creatinine, Ser: 0.93 mg/dL (ref 0.76–1.27)
GFR calc Af Amer: 96 mL/min/{1.73_m2} (ref >59)
GFR calc non Af Amer: 83 mL/min/{1.73_m2} (ref >59)
Globulin, Total: 2.1 g/dL (ref 1.5–4.5)
Glucose: 338 mg/dL — ABNORMAL HIGH (ref 65–99)
Potassium: 4.3 mmol/L (ref 3.5–5.2)
Sodium: 133 mmol/L — ABNORMAL LOW (ref 134–144)
Total Protein: 6.3 g/dL (ref 6.0–8.5)

## 2019-03-14 LAB — LIPID PANEL
Chol/HDL Ratio: 5.6 ratio — ABNORMAL HIGH (ref 0.0–5.0)
Cholesterol, Total: 190 mg/dL (ref 100–199)
HDL: 34 mg/dL — ABNORMAL LOW (ref >39)
LDL Chol Calc (NIH): 109 mg/dL — ABNORMAL HIGH (ref 0–99)
Triglycerides: 273 mg/dL — ABNORMAL HIGH (ref 0–149)
VLDL Cholesterol Cal: 47 mg/dL — ABNORMAL HIGH (ref 5–40)

## 2019-03-14 LAB — HEMOGLOBIN A1C
Est. average glucose Bld gHb Est-mCnc: 186 mg/dL
Hgb A1c MFr Bld: 8.1 % — ABNORMAL HIGH (ref 4.8–5.6)

## 2019-03-18 DIAGNOSIS — F3341 Major depressive disorder, recurrent, in partial remission: Secondary | ICD-10-CM | POA: Diagnosis not present

## 2019-03-22 ENCOUNTER — Encounter: Payer: Self-pay | Admitting: Family Medicine

## 2019-04-09 ENCOUNTER — Other Ambulatory Visit: Payer: Self-pay | Admitting: Family Medicine

## 2019-05-15 ENCOUNTER — Other Ambulatory Visit: Payer: Self-pay | Admitting: Family Medicine

## 2019-05-15 DIAGNOSIS — E1165 Type 2 diabetes mellitus with hyperglycemia: Secondary | ICD-10-CM

## 2019-05-17 MED ORDER — JARDIANCE 25 MG PO TABS: 25.0000 mg | ORAL_TABLET | Freq: Every day | ORAL | 1 refills | Status: DC

## 2019-05-17 NOTE — Addendum Note (Signed)
Addended by: Kittie Plater, Macy Lingenfelter HUA on: 05/17/2019 02:48 PM   Modules accepted: Orders

## 2019-05-17 NOTE — Telephone Encounter (Signed)
Rx of Jardiance 25 mg has been resent to pharmacy.  Thanks, [employer]

## 2019-05-17 NOTE — Telephone Encounter (Signed)
Pt was on 10MG  of Jardiance but Dr. Nyoka Cowden up the dosage to 25Mg  and now Pts RX refill is being denied/ please advise/ pt is out of this medication empagliflozin (JARDIANCE) 25 MG TABS tablet

## 2019-06-12 ENCOUNTER — Ambulatory Visit (INDEPENDENT_AMBULATORY_CARE_PROVIDER_SITE_OTHER): Payer: Medicare Other | Admitting: Family Medicine

## 2019-06-12 ENCOUNTER — Encounter: Payer: Self-pay | Admitting: Family Medicine

## 2019-06-12 ENCOUNTER — Other Ambulatory Visit: Payer: Self-pay

## 2019-06-12 VITALS — BP 133/80 | HR 76 | Temp 98.0°F | Ht 71.0 in | Wt 189.0 lb

## 2019-06-12 DIAGNOSIS — E1165 Type 2 diabetes mellitus with hyperglycemia: Secondary | ICD-10-CM | POA: Diagnosis not present

## 2019-06-12 LAB — HEMOGLOBIN A1C
Est. average glucose Bld gHb Est-mCnc: 180 mg/dL
Hgb A1c MFr Bld: 7.9 % — ABNORMAL HIGH (ref 4.8–5.6)

## 2019-06-12 NOTE — Progress Notes (Signed)
Subjective:  Patient ID: Andrew Tanner, male    DOB: 1951/03/17  Age: 69 y.o. MRN: [id number]  CC:  Chief Complaint  Patient presents with  . Follow-up    on type 2 diabetes. pt states he hasn't had any issues with his diabetes pt checks his BS 1-2 times a week. this mornings fasting reading was 167mg /dL. Pt states his medications works well no known side effects.    HPI Andrew Tanner presents for   Diabetes: Complicated by hyperglycemia, diabetic neuropathy.  Uncontrolled in September off meds, improved in December 2 but still elevated.  Did note some elevated readings on my chart message, asked him to send in some fasting as well as 2 hours after meals readings to determine changes.  Have not seen those since last visit.  Currently taking Jardiance 25 mg daily, glipizide 5 mg twice daily, Metformin 500 mg 3 times per day (did not tolerate 850mg  BID).  Has preferred this dose in the past when discussed twice daily dosing,   He is on ACE inhibitor with lisinopril, statin with atorvastatin. Gabapentin 300 mg twice daily controlling neuropathic symptoms. Fastings: 147, 167, 169, sometimes higher. No symptomatic lows.  2 hours postprandials: 175-180.  Cut carbs, exercising more - losing weight. Cut out soft drinks, fast food, alcohol).  Compliant with meds - denies missed doses.  Wt Readings from Last 3 Encounters:  06/12/19 189 lb (85.7 kg)  03/13/19 194 lb (88 kg)  01/30/19 194 lb 6.4 oz (88.2 kg)   Microalbumin: Normal ratio December 31, 2018 Optho, foot exam, pneumovax: Due for ophthalmology exam - plans to schedule.  No new side effects with meds.   Lab Results  Component Value Date   HGBA1C 8.1 (H) 03/13/2019   HGBA1C 12.1 (H) 12/31/2018   HGBA1C 8.3 (H) 01/01/2018   Lab Results  Component Value Date   MICROALBUR 3.8 (H) 03/10/2014   LDLCALC 109 (H) 03/13/2019   CREATININE 0.93 03/13/2019   Hyperlipidemia: Improving with restarting statin in September.   Continued on Lipitor 20 mg daily.  No new side effects.  Lab Results  Component Value Date   CHOL 190 03/13/2019   HDL 34 (L) 03/13/2019   LDLCALC 109 (H) 03/13/2019   TRIG 273 (H) 03/13/2019   CHOLHDL 5.6 (H) 03/13/2019   Lab Results  Component Value Date   ALT 31 03/13/2019   AST 21 03/13/2019   ALKPHOS 76 03/13/2019   BILITOT 0.4 03/13/2019   Hypertension: Lisinopril HCT 20/25mg  qd.  Home readings: BP Readings from Last 3 Encounters:  06/12/19 133/80  03/13/19 135/77  01/30/19 117/72   Lab Results  Component Value Date   CREATININE 0.93 03/13/2019   Plans on 2nd covid vaccine this Monday.    History Patient Active Problem List   Diagnosis Date Noted  . Diabetes mellitus type II 06/02/2011  . Hypertension 06/02/2011  . Hyperlipemia 06/02/2011  . Cataract 06/02/2011   Past Medical History:  Diagnosis Date  . Cataract   . Diabetes mellitus without complication ([place])   . Hyperlipidemia   . Hypertension    Past Surgical History:  Procedure Laterality Date  . HERNIA REPAIR    . NECK SURGERY    . SPINE SURGERY    . TRANSURETHRAL RESECTION OF PROSTATE    . TURP VAPORIZATION     No Known Allergies Prior to Admission medications   Medication Sig Start Date End Date Taking? Authorizing Provider  ARIPiprazole (ABILIFY) 2 MG tablet Take 2 mg by  mouth daily.   Yes [provider]  atorvastatin (LIPITOR) 20 MG tablet Take 1 tablet (20 mg total) by mouth daily at 6 PM. 12/31/18  Yes [name], MD  citalopram (CELEXA) 40 MG tablet Take 40 mg by mouth daily.   Yes [provider]  clonazePAM (KLONOPIN) 0.5 MG tablet Take 0.5 mg by mouth 3 (three) times daily as needed.   Yes [provider]  empagliflozin (JARDIANCE) 25 MG TABS tablet Take 25 mg by mouth daily before breakfast. 05/17/19  Yes [name], MD  gabapentin (NEURONTIN) 300 MG capsule Take 1 capsule (300 mg total) by mouth 2 (two) times daily. 12/31/18  Yes [name], MD  glipiZIDE (GLUCOTROL) 5 MG tablet Take 1 tablet (5 mg total) by mouth 2 (two) times daily before a meal. 12/31/18  Yes [name], MD  glucose blood test strip 1 each by Other route daily. Use as instructed 07/26/17  Yes [name], MD  ibuprofen (ADVIL,MOTRIN) 800 MG tablet Take 1 tablet (800 mg total) by mouth every 8 (eight) hours as needed for pain. 01/09/12  Yes Shawnee Knapp, MD  lisinopril-hydrochlorothiazide (ZESTORETIC) 20-25 MG tablet Take 1 tablet by mouth daily. 12/31/18  Yes [name], MD  metFORMIN (GLUCOPHAGE) 500 MG tablet TAKE 1 TABLET BY MOUTH THREE TIMES DAILY 04/09/19  Yes [name], MD  Multiple Vitamin (MULTIVITAMIN) tablet Take 1 tablet by mouth daily.   Yes [provider]   Social History   Socioeconomic History  . Marital status: Married    Spouse name: [name]  . Number of children: Not on file  . Years of education: Not on file  . Highest education level: Not on file  Occupational History  . Not on file  Tobacco Use  . Smoking status: Former Research scientist (life sciences)  . Smokeless tobacco: Never Used  . Tobacco comment: quit 28 yrs ago  Substance and Sexual Activity  . Alcohol use: Yes    Comment: 1 glass wine per week  . Drug use: No  . Sexual activity: Never  Other Topics Concern  . Not on file  Social History Narrative  . Not on file   Social Determinants of Health   Financial Resource Strain:   . Difficulty of Paying Living Expenses: Not on file  Food Insecurity:   . Worried About Charity fundraiser in the Last Year: Not on file  . Ran Out of Food in the Last Year: Not on file  Transportation Needs:   . Lack of Transportation (Medical): Not on file  . Lack of Transportation (Non-Medical): Not on file  Physical Activity:   . Days of Exercise per Week: Not on file  . Minutes of Exercise per Session: Not on file  Stress:   . Feeling of Stress : Not on file  Social Connections:   . Frequency of  Communication with Friends and Family: Not on file  . Frequency of Social Gatherings with Friends and Family: Not on file  . Attends Religious Services: Not on file  . Active Member of Clubs or Organizations: Not on file  . Attends Archivist Meetings: Not on file  . Marital Status: Not on file  Intimate Partner Violence:   . Fear of Current or Ex-Partner: Not on file  . Emotionally Abused: Not on file  . Physically Abused: Not on file  . Sexually Abused: Not on file    Review of Systems  Constitutional: Negative for fatigue and unexpected weight change.  Eyes: Negative for visual disturbance.  Respiratory: Negative for cough, chest tightness and shortness of breath.   Cardiovascular: Negative for chest pain, palpitations and leg swelling.  Gastrointestinal: Negative for abdominal pain and blood in stool.  Neurological: Negative for dizziness, light-headedness and headaches.     Objective:   Vitals:   06/12/19 0908  BP: 133/80  Pulse: 76  Temp: 98 F (36.7 C)  TempSrc: Temporal  SpO2: 99%  Weight: 189 lb (85.7 kg)  Height: 5\' 11"  (1.803 m)     Physical Exam Vitals reviewed.  Constitutional:      Appearance: He is well-developed.  HENT:     Head: Normocephalic and atraumatic.  Eyes:     Pupils: Pupils are equal, round, and reactive to light.  Neck:     Vascular: No carotid bruit or JVD.  Cardiovascular:     Rate and Rhythm: Normal rate and regular rhythm.     Heart sounds: Normal heart sounds. No murmur.  Pulmonary:     Effort: Pulmonary effort is normal.     Breath sounds: Normal breath sounds. No rales.  Skin:    General: Skin is warm and dry.  Neurological:     Mental Status: He is alert and oriented to person, place, and time.        Assessment & Plan:  Andrew Tanner is a 69 y.o. male . Type 2 diabetes mellitus with hyperglycemia, without long-term current use of insulin (HCC) - Plan: Hemoglobin A1c  -Tolerating current medications  and has been compliant with current meds.  Commended on diet changes and weight loss.  Anticipate need for increased dose of glipizide but will check A1c first.  Has not tolerated higher dosing of Metformin.  Recheck 3 months for lipids, hypertension and diabetes.  No orders of the defined types were placed in this encounter.  Patient Instructions    Keep up the good work with diet changes and weight loss.  I will check A1c, but based on your home readings we may need to increase the glipizide.  I will let you know once I see the results.  No other med changes for now, recheck 3 months.  Let me know if there are questions.   If you have lab work done today you will be contacted with your lab results within the next 2 weeks.  If you have not heard from [place] then please contact us. The fastest way to get your results is to register for My Chart.   IF you received an x-ray today, you will receive an invoice from [facility]. Please contact [facility]iology at [phone] with questions or concerns regarding your invoice.   IF you received labwork today, you will receive an invoice from [place]. Please contact [facility] at [phone] with questions or concerns regarding your invoice.   Our billing staff will not be able to assist you with questions regarding bills from these companies.  You will be contacted with the lab results as soon as they are available. The fastest way to get your results is to activate your My Chart account. Instructions are located on the last page of this paperwork. If you have not heard from [place] regarding the results in 2 weeks, please contact this office.         Signed, [name], MD [facility]

## 2019-06-12 NOTE — Patient Instructions (Addendum)
  Keep up the good work with diet changes and weight loss.  I will check A1c, but based on your home readings we may need to increase the glipizide.  I will let you know once I see the results.  No other med changes for now, recheck 3 months.  Let me know if there are questions.   If you have lab work done today you will be contacted with your lab results within the next 2 weeks.  If you have not heard from [place] then please contact us. The fastest way to get your results is to register for My Chart.   IF you received an x-ray today, you will receive an invoice from [facility]. Please contact [facility] Radiology at [phone] with questions or concerns regarding your invoice.   IF you received labwork today, you will receive an invoice from [place]. Please contact [facility] at [phone] with questions or concerns regarding your invoice.   Our billing staff will not be able to assist you with questions regarding bills from these companies.  You will be contacted with the lab results as soon as they are available. The fastest way to get your results is to activate your My Chart account. Instructions are located on the last page of this paperwork. If you have not heard from [place] regarding the results in 2 weeks, please contact this office.

## 2019-08-09 LAB — HM DIABETES EYE EXAM

## 2019-08-15 ENCOUNTER — Other Ambulatory Visit: Payer: Self-pay | Admitting: Family Medicine

## 2019-08-15 DIAGNOSIS — I10 Essential (primary) hypertension: Secondary | ICD-10-CM

## 2019-08-19 ENCOUNTER — Other Ambulatory Visit: Payer: Self-pay | Admitting: Dermatology

## 2019-09-12 ENCOUNTER — Other Ambulatory Visit: Payer: Self-pay

## 2019-09-12 ENCOUNTER — Ambulatory Visit (INDEPENDENT_AMBULATORY_CARE_PROVIDER_SITE_OTHER): Payer: Medicare Other | Admitting: Family Medicine

## 2019-09-12 ENCOUNTER — Encounter: Payer: Self-pay | Admitting: Family Medicine

## 2019-09-12 VITALS — BP 119/85 | HR 98 | Temp 98.0°F | Ht 71.0 in | Wt 188.0 lb

## 2019-09-12 DIAGNOSIS — I10 Essential (primary) hypertension: Secondary | ICD-10-CM

## 2019-09-12 DIAGNOSIS — N401 Enlarged prostate with lower urinary tract symptoms: Secondary | ICD-10-CM | POA: Diagnosis not present

## 2019-09-12 DIAGNOSIS — R351 Nocturia: Secondary | ICD-10-CM

## 2019-09-12 DIAGNOSIS — E785 Hyperlipidemia, unspecified: Secondary | ICD-10-CM | POA: Diagnosis not present

## 2019-09-12 DIAGNOSIS — E1165 Type 2 diabetes mellitus with hyperglycemia: Secondary | ICD-10-CM

## 2019-09-12 LAB — POCT URINALYSIS DIP (MANUAL ENTRY)
Bilirubin, UA: NEGATIVE
Blood, UA: NEGATIVE
Glucose, UA: >=1000 mg/dL — AB
Ketones, POC UA: NEGATIVE mg/dL
Leukocytes, UA: NEGATIVE
Nitrite, UA: NEGATIVE
Protein Ur, POC: NEGATIVE mg/dL
Spec Grav, UA: 1.015 (ref 1.010–1.025)
Urobilinogen, UA: 0.2 E.U./dL
pH, UA: 5 (ref 5.0–8.0)

## 2019-09-12 LAB — POC MICROSCOPIC URINALYSIS (UMFC): Mucus: ABSENT

## 2019-09-12 MED ORDER — ALFUZOSIN HCL ER 10 MG PO TB24: 10.0000 mg | ORAL_TABLET | Freq: Every day | ORAL | 2 refills | Status: DC

## 2019-09-12 NOTE — Patient Instructions (Addendum)
°  I will check a urine test, prostate test and refer you to urology for the BPH.  Can start alfuzosin once per day for nighttime urination.  Watch for lightheadedness, dizziness, dry mouth with that medication.  Second dose of glipizide should be with meal.  I will check your blood sugar and other labs today.  No other changes for now.  Thanks for coming in and let me know if there are questions.  Recheck in 3 months for diabetes.    If you have lab work done today you will be contacted with your lab results within the next 2 weeks.  If you have not heard from [place] then please contact us. The fastest way to get your results is to register for My Chart.   IF you received an x-ray today, you will receive an invoice from [facility]. Please contact [facility]iology at [phone] with questions or concerns regarding your invoice.   IF you received labwork today, you will receive an invoice from [place]. Please contact [facility] at [phone] with questions or concerns regarding your invoice.   Our billing staff will not be able to assist you with questions regarding bills from these companies.  You will be contacted with the lab results as soon as they are available. The fastest way to get your results is to activate your My Chart account. Instructions are located on the last page of this paperwork. If you have not heard from [place] regarding the results in 2 weeks, please contact this office.

## 2019-09-12 NOTE — Progress Notes (Signed)
Subjective:  Patient ID: Andrew Tanner, male    DOB: 1950/11/02  Age: 69 y.o. MRN: [id number]  CC:  Chief Complaint  Patient presents with   Follow-up    on diabetes, hyperglycermia, and hypertenstion. pt reports no issues with his diabetes since last visit. pt has been watching his diet. pt checks his BS a couple times a week. thismornings reading was 197mg /dl fasting. pt reports no issues with BP since lasr visit. pt reports he thinks his medication is really helping. pt dosen't check his BP offten he checks it 1-2x monthly. pt reports no physical symptoms of hyperglycemia or hypertension.    HPI Andrew Tanner presents for   Hypertension: Lisinopril HCTZ 20/25 mg daily. Home readings: rare - stable.  No new side effects. Compliant with meds.  BP Readings from Last 3 Encounters:  09/12/19 119/85  06/12/19 133/80  03/13/19 135/77   Lab Results  Component Value Date   CREATININE 0.93 03/13/2019   Hyperlipidemia: Lipitor 20 mg daily, no new myalgias or side effects. Compliant with daily use.  Lab Results  Component Value Date   CHOL 190 03/13/2019   HDL 34 (L) 03/13/2019   LDLCALC 109 (H) 03/13/2019   TRIG 273 (H) 03/13/2019   CHOLHDL 5.6 (H) 03/13/2019   Lab Results  Component Value Date   ALT 31 03/13/2019   AST 21 03/13/2019   ALKPHOS 76 03/13/2019   BILITOT 0.4 03/13/2019   Diabetes: Complicated by hyperglycemia and diabetic neuropathy. Last visit March 3.   Gabapentin 300 mg twice daily controlling neuropathic symptoms. Backed off gabapentin as no pain - only 1-2 tabs per week.  Glipizide increased to 10 mg in the morning, 5 mg at night in March. Has been taking that dose, but before bed - not meal.  No symptomatic lows.  He was continued on Jardiance 25 mg daily, Metformin 500 mg 3 times daily.  He is on statin, ACE inhibitor as above. Home readings 192 this morning. Usual fasting 170-180.  Microalbumin: Normal ratio 12/31/2018 Optho, foot exam,  pneumovax: Up-to-date  Has been working on diet/exercise.  Wt Readings from Last 3 Encounters:  09/12/19 188 lb (85.3 kg)  06/12/19 189 lb (85.7 kg)  03/13/19 194 lb (88 kg)    Lab Results  Component Value Date   HGBA1C 7.9 (H) 06/12/2019   HGBA1C 8.1 (H) 03/13/2019   HGBA1C 12.1 (H) 12/31/2018   Lab Results  Component Value Date   MICROALBUR 3.8 (H) 03/10/2014   LDLCALC 109 (H) 03/13/2019   CREATININE 0.93 03/13/2019   BPH/nocturia.  Diagnosed with urology, Dr. Hazle Nordmann in [facility]. Procedure to shrink prostate a while ago.  No daytime symptoms of hesitancy, dribbling typically.  Nocturia 3-4 times per night for years. No dysuria.  No results found for: PSA1, PSA Has not seen urology past few years.  Would like to be seen in [place].       HM: Fecal immunochemical test (FIT) negative on 04/03/19. Discussed Cologuard. No FH of colon CA. Discussed colonoscopy as well. He would like to weigh options and let me know.    S/p  Moderna covid vaccine x 2.   History Patient Active Problem List   Diagnosis Date Noted   Diabetes mellitus type II 06/02/2011   Hypertension 06/02/2011   Hyperlipemia 06/02/2011   Cataract 06/02/2011   Past Medical History:  Diagnosis Date   Cataract    Diabetes mellitus without complication ([place])    Hyperlipidemia    Hypertension  Past Surgical History:  Procedure Laterality Date   HERNIA REPAIR     NECK SURGERY     SPINE SURGERY     TRANSURETHRAL RESECTION OF PROSTATE     TURP VAPORIZATION     No Known Allergies Prior to Admission medications   Medication Sig Start Date End Date Taking? Authorizing Provider  ARIPiprazole (ABILIFY) 2 MG tablet Take 2 mg by mouth daily.   Yes [provider]  atorvastatin (LIPITOR) 20 MG tablet Take 1 tablet (20 mg total) by mouth daily at 6 PM. 12/31/18  Yes [name], MD  citalopram (CELEXA) 40 MG tablet Take 40 mg by mouth daily.   Yes [provider]  clonazePAM (KLONOPIN) 0.5 MG tablet Take 0.5 mg by mouth 3 (three) times daily as needed.   Yes [provider]  empagliflozin (JARDIANCE) 25 MG TABS tablet Take 25 mg by mouth daily before breakfast. 05/17/19  Yes [name], MD  gabapentin (NEURONTIN) 300 MG capsule Take 1 capsule (300 mg total) by mouth 2 (two) times daily. 12/31/18  Yes [name], MD  glipiZIDE (GLUCOTROL) 5 MG tablet Take 1 tablet (5 mg total) by mouth 2 (two) times daily before a meal. 12/31/18  Yes [name], MD  glucose blood test strip 1 each by Other route daily. Use as instructed 07/26/17  Yes [name], MD  hydrocortisone 2.5 % ointment APPLY TO AFFECTED AREA EVERYDAY AT BEDTIME 08/19/19  Yes Lavonna Monarch, MD  ibuprofen (ADVIL,MOTRIN) 800 MG tablet Take 1 tablet (800 mg total) by mouth every 8 (eight) hours as needed for pain. 01/09/12  Yes Shawnee Knapp, MD  lisinopril-hydrochlorothiazide (ZESTORETIC) 20-25 MG tablet Take 1 tablet by mouth once daily 08/15/19  Yes [name], MD  metFORMIN (GLUCOPHAGE) 500 MG tablet TAKE 1 TABLET BY MOUTH THREE TIMES DAILY 04/09/19  Yes [name], MD  Multiple Vitamin (MULTIVITAMIN) tablet Take 1 tablet by mouth daily.   Yes [provider]   Social History   Socioeconomic History   Marital status: Married    Spouse name: [name]   Number of children: Not on file   Years of education: Not on file   Highest education level: Not on file  Occupational History   Not on file  Tobacco Use   Smoking status: Former Smoker   Smokeless tobacco: Never Used   Tobacco comment: quit 28 yrs ago  Substance and Sexual Activity   Alcohol use: Yes    Comment: 1 glass wine per week   Drug use: No   Sexual activity: Never  Other Topics Concern   Not on file  Social History Narrative   Not on file   Social Determinants of Health   Financial Resource Strain:    Difficulty of Paying Living Expenses:   Food  Insecurity:    Worried About Charity fundraiser in the Last Year:    Arboriculturist in the Last Year:   Transportation Needs:    Film/video editor (Medical):    Lack of Transportation (Non-Medical):   Physical Activity:    Days of Exercise per Week:    Minutes of Exercise per Session:   Stress:    Feeling of Stress :   Social Connections:    Frequency of Communication with Friends and Family:    Frequency of Social Gatherings with Friends and Family:    Attends Religious Services:    Active Member of  Clubs or Organizations:    Attends Music therapist:    Marital Status:   Intimate Partner Violence:    Fear of Current or Ex-Partner:    Emotionally Abused:    Physically Abused:    Sexually Abused:     Review of Systems  Constitutional: Negative for fatigue and unexpected weight change.  Eyes: Negative for visual disturbance.  Respiratory: Negative for cough, chest tightness and shortness of breath.   Cardiovascular: Negative for chest pain, palpitations and leg swelling.  Gastrointestinal: Negative for abdominal pain and blood in stool.  Genitourinary: Negative for decreased urine volume, difficulty urinating (nocturia), dysuria and hematuria.  Neurological: Negative for dizziness, light-headedness and headaches.     Objective:   Vitals:   09/12/19 0944  BP: 119/85  Pulse: 98  Temp: 98 F (36.7 C)  TempSrc: Temporal  SpO2: 98%  Weight: 188 lb (85.3 kg)  Height: 5\' 11"  (1.803 m)     Physical Exam Vitals reviewed.  Constitutional:      Appearance: He is well-developed.  HENT:     Head: Normocephalic and atraumatic.  Eyes:     Pupils: Pupils are equal, round, and reactive to light.  Neck:     Vascular: No carotid bruit or JVD.  Cardiovascular:     Rate and Rhythm: Normal rate and regular rhythm.     Heart sounds: Normal heart sounds. No murmur.  Pulmonary:     Effort: Pulmonary effort is normal.     Breath sounds:  Normal breath sounds. No rales.  Abdominal:     General: Abdomen is flat.     Tenderness: There is no abdominal tenderness.  Musculoskeletal:     Right lower leg: No edema.     Left lower leg: No edema.  Skin:    General: Skin is warm and dry.  Neurological:     Mental Status: He is alert and oriented to person, place, and time.      Assessment & Plan:  Shakeer Giorno is a 69 y.o. male . Type 2 diabetes mellitus with hyperglycemia, without long-term current use of insulin (HCC) - Plan: Hemoglobin A1c  -Fasting elevated this morning.  Recommended second dose of glipizide with his meal, but may need further changes.  A1c pending  Essential hypertension - Plan: Lipid panel, Comprehensive metabolic panel  - Stable, tolerating current regimen. Medications refilled. Labs pending as above.   Hyperlipidemia, unspecified hyperlipidemia type - Plan: Lipid panel, Comprehensive metabolic panel  - Stable, tolerating current regimen. Medications refilled. Labs pending as above.   BPH associated with nocturia - Plan: PSA, POCT urinalysis dipstick, POCT Microscopic Urinalysis ([facility]), Ambulatory referral to Urology  -Overall stable but persistent symptoms of nocturia.  Denies symptoms of retention or significant daytime symptoms.  We will try Uroxatrol, potential side effects discussed.  Refer to urology locally and check PSA.  Meds ordered this encounter  Medications   alfuzosin (UROXATRAL) 10 MG 24 hr tablet    Sig: Take 1 tablet (10 mg total) by mouth daily with breakfast.    Dispense:  30 tablet    Refill:  2   Patient Instructions    I will check a urine test, prostate test and refer you to urology for the BPH.  Can start alfuzosin once per day for nighttime urination.  Watch for lightheadedness, dizziness, dry mouth with that medication.  Second dose of glipizide should be with meal.  I will check your blood sugar and other labs today.  No  other changes for now.  Thanks for coming in  and let me know if there are questions.  Recheck in 3 months for diabetes.    If you have lab work done today you will be contacted with your lab results within the next 2 weeks.  If you have not heard from [place] then please contact us. The fastest way to get your results is to register for My Chart.   IF you received an x-ray today, you will receive an invoice from [facility]. Please contact [facility] at [phone] with questions or concerns regarding your invoice.   IF you received labwork today, you will receive an invoice from [place]. Please contact [facility] at [phone] with questions or concerns regarding your invoice.   Our billing staff will not be able to assist you with questions regarding bills from these companies.  You will be contacted with the lab results as soon as they are available. The fastest way to get your results is to activate your My Chart account. Instructions are located on the last page of this paperwork. If you have not heard from [place] regarding the results in 2 weeks, please contact this office.         Signed, [name], MD [facility]

## 2019-09-13 LAB — COMPREHENSIVE METABOLIC PANEL
ALT: 35 IU/L (ref 0–44)
AST: 20 IU/L (ref 0–40)
Albumin/Globulin Ratio: 2.3 — ABNORMAL HIGH (ref 1.2–2.2)
Albumin: 4.5 g/dL (ref 3.8–4.8)
Alkaline Phosphatase: 77 IU/L (ref 48–121)
BUN/Creatinine Ratio: 21 (ref 10–24)
BUN: 21 mg/dL (ref 8–27)
Bilirubin Total: 0.2 mg/dL (ref 0.0–1.2)
CO2: 21 mmol/L (ref 20–29)
Calcium: 10.3 mg/dL — ABNORMAL HIGH (ref 8.6–10.2)
Chloride: 98 mmol/L (ref 96–106)
Creatinine, Ser: 0.98 mg/dL (ref 0.76–1.27)
GFR calc Af Amer: 90 mL/min/{1.73_m2} (ref >59)
GFR calc non Af Amer: 78 mL/min/{1.73_m2} (ref >59)
Globulin, Total: 2 g/dL (ref 1.5–4.5)
Glucose: 184 mg/dL — ABNORMAL HIGH (ref 65–99)
Potassium: 4.7 mmol/L (ref 3.5–5.2)
Sodium: 133 mmol/L — ABNORMAL LOW (ref 134–144)
Total Protein: 6.5 g/dL (ref 6.0–8.5)

## 2019-09-13 LAB — HEMOGLOBIN A1C
Est. average glucose Bld gHb Est-mCnc: 169 mg/dL
Hgb A1c MFr Bld: 7.5 % — ABNORMAL HIGH (ref 4.8–5.6)

## 2019-09-13 LAB — LIPID PANEL
Chol/HDL Ratio: 4.9 ratio (ref 0.0–5.0)
Cholesterol, Total: 221 mg/dL — ABNORMAL HIGH (ref 100–199)
HDL: 45 mg/dL (ref >39)
LDL Chol Calc (NIH): 153 mg/dL — ABNORMAL HIGH (ref 0–99)
Triglycerides: 130 mg/dL (ref 0–149)
VLDL Cholesterol Cal: 23 mg/dL (ref 5–40)

## 2019-09-13 LAB — PSA: Prostate Specific Ag, Serum: 2.7 ng/mL (ref 0.0–4.0)

## 2019-09-16 ENCOUNTER — Other Ambulatory Visit: Payer: Self-pay | Admitting: Family Medicine

## 2019-09-16 DIAGNOSIS — E785 Hyperlipidemia, unspecified: Secondary | ICD-10-CM

## 2019-09-16 NOTE — Telephone Encounter (Signed)
Requested Prescriptions  Pending Prescriptions Disp Refills  . atorvastatin (LIPITOR) 20 MG tablet [Pharmacy Med Name: Atorvastatin Calcium 20 MG Oral Tablet] 90 tablet 0    Sig: TAKE 1 TABLET BY MOUTH ONCE DAILY AT  6  PM     Cardiovascular:  Antilipid - Statins Failed - 09/16/2019  9:27 AM      Failed - Total Cholesterol in normal range and within 360 days    Cholesterol, Total  Date Value Ref Range Status  09/12/2019 221 (H) 100 - 199 mg/dL Final         Failed - LDL in normal range and within 360 days    LDL Chol Calc (NIH)  Date Value Ref Range Status  09/12/2019 153 (H) 0 - 99 mg/dL Final         Passed - HDL in normal range and within 360 days    HDL  Date Value Ref Range Status  09/12/2019 45 >39 mg/dL Final         Passed - Triglycerides in normal range and within 360 days    Triglycerides  Date Value Ref Range Status  09/12/2019 130 0 - 149 mg/dL Final         Passed - Patient is not pregnant      Passed - Valid encounter within last 12 months    Recent Outpatient Visits          4 days ago Type 2 diabetes mellitus with hyperglycemia, without long-term current use of insulin ([place])   [facility], [name], MD   3 months ago Type 2 diabetes mellitus with hyperglycemia, without long-term current use of insulin [facility])   [facility] [place], [name], MD   6 months ago Type 2 diabetes mellitus with hyperglycemia, without long-term current use of insulin [facility])   [facility] [place], [name], MD   7 months ago Type 2 diabetes mellitus with hyperglycemia, without long-term current use of insulin [facility])   [facility] [place], [name], MD   8 months ago Type 2 diabetes mellitus with hyperglycemia, without long-term current use of insulin [facility])   [facility] [place], [name], MD      Future Appointments            In 2 months Carlota Raspberry [name], MD Primary Care at [place], [facility]

## 2019-09-23 ENCOUNTER — Encounter: Payer: Self-pay | Admitting: Family Medicine

## 2019-11-12 DIAGNOSIS — N401 Enlarged prostate with lower urinary tract symptoms: Secondary | ICD-10-CM | POA: Diagnosis not present

## 2019-11-12 DIAGNOSIS — R351 Nocturia: Secondary | ICD-10-CM | POA: Diagnosis not present

## 2019-11-12 DIAGNOSIS — R350 Frequency of micturition: Secondary | ICD-10-CM | POA: Diagnosis not present

## 2019-11-12 DIAGNOSIS — R3915 Urgency of urination: Secondary | ICD-10-CM | POA: Diagnosis not present

## 2019-11-16 ENCOUNTER — Other Ambulatory Visit: Payer: Self-pay | Admitting: Family Medicine

## 2019-11-16 DIAGNOSIS — E785 Hyperlipidemia, unspecified: Secondary | ICD-10-CM

## 2019-11-16 DIAGNOSIS — I10 Essential (primary) hypertension: Secondary | ICD-10-CM

## 2019-11-16 NOTE — Telephone Encounter (Signed)
Requested Prescriptions  Pending Prescriptions Disp Refills   atorvastatin (LIPITOR) 20 MG tablet [Pharmacy Med Name: Atorvastatin Calcium 20 MG Oral Tablet] 90 tablet 0    Sig: TAKE 1 TABLET BY MOUTH ONCE DAILY AT  6  PM     Cardiovascular:  Antilipid - Statins Failed - 11/16/2019 10:13 AM      Failed - Total Cholesterol in normal range and within 360 days    Cholesterol, Total  Date Value Ref Range Status  09/12/2019 221 (H) 100 - 199 mg/dL Final         Failed - LDL in normal range and within 360 days    LDL Chol Calc (NIH)  Date Value Ref Range Status  09/12/2019 153 (H) 0 - 99 mg/dL Final         Passed - HDL in normal range and within 360 days    HDL  Date Value Ref Range Status  09/12/2019 45 >39 mg/dL Final         Passed - Triglycerides in normal range and within 360 days    Triglycerides  Date Value Ref Range Status  09/12/2019 130 0 - 149 mg/dL Final         Passed - Patient is not pregnant      Passed - Valid encounter within last 12 months    Recent Outpatient Visits          2 months ago Type 2 diabetes mellitus with hyperglycemia, without long-term current use of insulin ([place])   [facility], [name], MD   5 months ago Type 2 diabetes mellitus with hyperglycemia, without long-term current use of insulin ([place])   [facility] [place], [name], MD   8 months ago Type 2 diabetes mellitus with hyperglycemia, without long-term current use of insulin [facility])   [facility] [place], [name], MD   9 months ago Type 2 diabetes mellitus with hyperglycemia, without long-term current use of insulin [facility])   [facility] [place], [name], MD   10 months ago Type 2 diabetes mellitus with hyperglycemia, without long-term current use of insulin [facility])   [facility] [place], [name], MD      Future Appointments            In 3 weeks [name], MD Primary [facility] at [place], [facility]             lisinopril-hydrochlorothiazide (ZESTORETIC) 20-25 MG tablet [Pharmacy Med Name: Lisinopril-hydroCHLOROthiazide 20-25 MG Oral Tablet] 90 tablet 0    Sig: Take 1 tablet by mouth once daily     Cardiovascular:  ACEI + Diuretic Combos Failed - 11/16/2019 10:13 AM      Failed - Na in normal range and within 180 days    Sodium  Date Value Ref Range Status  09/12/2019 133 (L) 134 - 144 mmol/L Final         Failed - Ca in normal range and within 180 days    Calcium  Date Value Ref Range Status  09/12/2019 10.3 (H) 8.6 - 10.2 mg/dL Final         Passed - K in normal range and within 180 days    Potassium  Date Value Ref Range Status  09/12/2019 4.7 3.5 - 5.2 mmol/L Final         Passed - Cr in normal range and within 180 days    Creat  Date Value Ref Range  Status  03/10/2014 0.88 0.50 - 1.35 mg/dL Final   Creatinine, Ser  Date Value Ref Range Status  09/12/2019 0.98 0.76 - 1.27 mg/dL Final         Passed - Patient is not pregnant      Passed - Last BP in normal range    BP Readings from Last 1 Encounters:  09/12/19 119/85         Passed - Valid encounter within last 6 months    Recent Outpatient Visits          2 months ago Type 2 diabetes mellitus with hyperglycemia, without long-term current use of insulin ([place])   [facility] [place], [name], MD   5 months ago Type 2 diabetes mellitus with hyperglycemia, without long-term current use of insulin [facility])   [facility] [place], [name], MD   8 months ago Type 2 diabetes mellitus with hyperglycemia, without long-term current use of insulin [facility])   [facility] [place], [name], MD   9 months ago Type 2 diabetes mellitus with hyperglycemia, without long-term current use of insulin [facility])   [facility] [place], [name], MD   10 months ago Type 2 diabetes mellitus with hyperglycemia, without long-term current use of insulin [facility])   [facility] [place], [name], MD       Future Appointments            In 3 weeks Carlota Raspberry [name], MD Primary Care at [place], [facility]

## 2019-12-03 ENCOUNTER — Other Ambulatory Visit: Payer: Self-pay | Admitting: Family Medicine

## 2019-12-11 ENCOUNTER — Other Ambulatory Visit: Payer: Self-pay | Admitting: Family Medicine

## 2019-12-11 NOTE — Telephone Encounter (Signed)
Requested Prescriptions  Pending Prescriptions Disp Refills   metFORMIN (GLUCOPHAGE) 500 MG tablet [Pharmacy Med Name: metFORMIN HCl 500 MG Oral Tablet] 270 tablet 0    Sig: TAKE 1 TABLET BY MOUTH THREE TIMES DAILY     Endocrinology:  Diabetes - Biguanides Passed - 12/11/2019  9:16 AM      Passed - Cr in normal range and within 360 days    Creat  Date Value Ref Range Status  03/10/2014 0.88 0.50 - 1.35 mg/dL Final   Creatinine, Ser  Date Value Ref Range Status  09/12/2019 0.98 0.76 - 1.27 mg/dL Final         Passed - HBA1C is between 0 and 7.9 and within 180 days    Hgb A1c MFr Bld  Date Value Ref Range Status  09/12/2019 7.5 (H) 4.8 - 5.6 % Final    Comment:             Prediabetes: 5.7 - 6.4          Diabetes: >6.4          Glycemic control for adults with diabetes: <7.0          Passed - eGFR in normal range and within 360 days    GFR, Est African American  Date Value Ref Range Status  03/10/2014 >89 mL/min Final   GFR calc Af Amer  Date Value Ref Range Status  09/12/2019 90 >59 mL/min/1.73 Final    Comment:    **[facility] currently reports eGFR in compliance with the current**   recommendations of the [employer]. [facility] will   update reporting as new guidelines are published from the NKF-ASN   Task force.    GFR, Est Non African American  Date Value Ref Range Status  03/10/2014 >89 mL/min Final    Comment:      The estimated GFR is a calculation valid for adults (>=24 years old) that uses the CKD-EPI algorithm to adjust for age and sex. It is   not to be used for children, pregnant women, hospitalized patients,    patients on dialysis, or with rapidly changing kidney function. According to the NKDEP, eGFR >89 is normal, 60-89 shows mild impairment, 30-59 shows moderate impairment, 15-29 shows severe impairment and <15 is ESRD.      GFR calc non Af Amer  Date Value Ref Range Status  09/12/2019 78 >59 mL/min/1.73 Final         Passed -  Valid encounter within last 6 months    Recent Outpatient Visits          3 months ago Type 2 diabetes mellitus with hyperglycemia, without long-term current use of insulin [facility])   [facility] [place], [name], MD   6 months ago Type 2 diabetes mellitus with hyperglycemia, without long-term current use of insulin [facility])   [facility] [place], [name], MD   9 months ago Type 2 diabetes mellitus with hyperglycemia, without long-term current use of insulin [facility]   [facility] [place], [name], MD   10 months ago Type 2 diabetes mellitus with hyperglycemia, without long-term current use of insulin [facility])   [facility] [place], [name], MD   11 months ago Type 2 diabetes mellitus with hyperglycemia, without long-term current use of insulin [facility])   [facility] [place], [name], MD              Pt. Has been notified  via MyChart to reschedule his 3 mo. F/u with PCP.  Will refill 90 day supply at this time.

## 2019-12-13 ENCOUNTER — Ambulatory Visit: Payer: Medicare Other | Admitting: Family Medicine

## 2019-12-23 ENCOUNTER — Other Ambulatory Visit: Payer: Self-pay | Admitting: Family Medicine

## 2019-12-24 DIAGNOSIS — R350 Frequency of micturition: Secondary | ICD-10-CM | POA: Diagnosis not present

## 2019-12-24 DIAGNOSIS — R3915 Urgency of urination: Secondary | ICD-10-CM | POA: Diagnosis not present

## 2019-12-24 DIAGNOSIS — R351 Nocturia: Secondary | ICD-10-CM | POA: Diagnosis not present

## 2019-12-24 DIAGNOSIS — N401 Enlarged prostate with lower urinary tract symptoms: Secondary | ICD-10-CM | POA: Diagnosis not present

## 2019-12-27 ENCOUNTER — Ambulatory Visit: Payer: Medicare Other | Admitting: Family Medicine

## 2020-01-13 ENCOUNTER — Telehealth: Payer: Self-pay | Admitting: Family Medicine

## 2020-01-13 NOTE — Telephone Encounter (Signed)
LVMTCB to resch his appt on 01/20/20 due to provider being out of office that day. Can be resch with FNP Huston Foley Just.

## 2020-01-20 ENCOUNTER — Other Ambulatory Visit: Payer: Self-pay

## 2020-01-20 ENCOUNTER — Ambulatory Visit: Payer: Medicare Other | Admitting: Family Medicine

## 2020-01-20 ENCOUNTER — Ambulatory Visit (INDEPENDENT_AMBULATORY_CARE_PROVIDER_SITE_OTHER): Payer: Medicare Other | Admitting: Family Medicine

## 2020-01-20 ENCOUNTER — Encounter: Payer: Self-pay | Admitting: Family Medicine

## 2020-01-20 VITALS — BP 133/86 | HR 98 | Temp 98.1°F | Ht 71.0 in | Wt 192.0 lb

## 2020-01-20 DIAGNOSIS — E785 Hyperlipidemia, unspecified: Secondary | ICD-10-CM

## 2020-01-20 DIAGNOSIS — I10 Essential (primary) hypertension: Secondary | ICD-10-CM | POA: Diagnosis not present

## 2020-01-20 DIAGNOSIS — Z23 Encounter for immunization: Secondary | ICD-10-CM

## 2020-01-20 DIAGNOSIS — Z1211 Encounter for screening for malignant neoplasm of colon: Secondary | ICD-10-CM

## 2020-01-20 DIAGNOSIS — E1165 Type 2 diabetes mellitus with hyperglycemia: Secondary | ICD-10-CM | POA: Diagnosis not present

## 2020-01-20 LAB — LIPID PANEL

## 2020-01-20 NOTE — Progress Notes (Signed)
Subjective:  Patient ID: Andrew Tanner, male    DOB: November 01, 1950  Age: 69 y.o. MRN: [id number]  CC:  Chief Complaint  Patient presents with  . Diabetes  . Hyperlipidemia  . R abd pain    intermittent once every 2 to 3 weeks lasting 1 full day    HPI Rino Hosea presents for medication follow up with a medical history notable for DM, HLD and HTN.  He has noticed a dull ache right lower quadrant, once every 2-3 weeks. Episodal, lasts about an hour. He believes this may be connected to his Diet Coke intake with his medications. He plans on stopping Diet coke to see if this improves. He denies nausea, vomiting, diarrhea, blood in stool, pain on palpation.   Hypertension: Lisinopril HCTZ 20/25 mg daily. BP at goal ,140/90 Home readings: stable, continuous monitoring with smart watch. No new side effects. Compliant with meds.  BP Readings from Last 3 Encounters:  01/20/20 133/86  09/12/19 119/85  06/12/19 133/80   Lab Results  Component Value Date   CREATININE 0.98 09/12/2019   Hyperlipidemia: Lipitor 40 mg daily, no new myalgias or side effects. Compliant with daily use.   Lab Results  Component Value Date   CHOL 221 (H) 09/12/2019   HDL 45 09/12/2019   LDLCALC 153 (H) 09/12/2019   TRIG 130 09/12/2019   CHOLHDL 4.9 09/12/2019   Lab Results  Component Value Date   ALT 35 09/12/2019   AST 20 09/12/2019   ALKPHOS 77 09/12/2019   BILITOT 0.2 09/12/2019   Diabetes: Complicated by hyperglycemia and diabetic neuropathy. Last visit June 3.   Gabapentin stopped taking months ago, with no change in neruopathy. Glipizide tid 10 mg in the morning, 10 mg afternoon,  5 mg at night. Has been taking that dose, but before bed - not meal.  No symptomatic lows.   He was continued on Jardiance 25 mg daily, Metformin 500 mg 3 times daily.   He is on statin, ACE inhibitor as above. Home readings didn't test this morning. Fasting range 130-200.  Microalbumin: Normal ratio  12/31/2018 Optho, foot exam, pneumovax: Up-to-date  Has been working on diet/exercise.  Wt Readings from Last 3 Encounters:  01/20/20 192 lb (87.1 kg)  09/12/19 188 lb (85.3 kg)  06/12/19 189 lb (85.7 kg)    Lab Results  Component Value Date   HGBA1C 7.5 (H) 09/12/2019   HGBA1C 7.9 (H) 06/12/2019   HGBA1C 8.1 (H) 03/13/2019   Lab Results  Component Value Date   MICROALBUR 3.8 (H) 03/10/2014   LDLCALC 153 (H) 09/12/2019   CREATININE 0.98 09/12/2019   BPH/nocturia.  Diagnosed with urology, Dr.  in [facility]. Procedure to shrink prostate a while ago.  No daytime symptoms of hesitancy, dribbling typically.  Nocturia 2 times per night. No dysuria.  Lab Results  Component Value Date   PSA1 2.7 09/12/2019   Dr. Gloriann Loan at [facility]: Urology Myrbetriq from Urology   HM: Fecal immunochemical test (FIT) negative on 04/03/19. Discussed Cologuard. No FH of colon CA. Discussed colonoscopy as well. He would like to weigh options and let me know.   Send cologuard with patinet  S/p  Moderna covid vaccine x 2.   History Patient Active Problem List   Diagnosis Date Noted  . Diabetes mellitus type II 06/02/2011  . Hypertension 06/02/2011  . Hyperlipemia 06/02/2011  . Cataract 06/02/2011   Past Medical History:  Diagnosis Date  . Cataract   . Diabetes mellitus without complication (   Subjective:  Patient ID: Andrew Tanner, male    DOB: 05/30/1950  Age: 69 y.o. MRN: [id number]  CC:  Chief Complaint  Patient presents with  . Diabetes  . Hyperlipidemia  . R abd pain    intermittent once every 2 to 3 weeks lasting 1 full day    HPI Weston Ambrosino presents for medication follow up with a medical history notable for DM, HLD and HTN.  He has noticed a dull ache right lower quadrant, once every 2-3 weeks. Episodal, lasts about an hour. He believes this may be connected to his Diet Coke intake with his medications. He plans on stopping Diet coke to see if this improves. He denies nausea, vomiting, diarrhea, blood in stool, pain on palpation.   Hypertension: Lisinopril HCTZ 20/25 mg daily. BP at goal ,140/90 Home readings: stable, continuous monitoring with smart watch. No new side effects. Compliant with meds.  BP Readings from Last 3 Encounters:  01/20/20 133/86  09/12/19 119/85  06/12/19 133/80   Lab Results  Component Value Date   CREATININE 0.98 09/12/2019   Hyperlipidemia: Lipitor 40 mg daily, no new myalgias or side effects. Compliant with daily use.   Lab Results  Component Value Date   CHOL 221 (H) 09/12/2019   HDL 45 09/12/2019   LDLCALC 153 (H) 09/12/2019   TRIG 130 09/12/2019   CHOLHDL 4.9 09/12/2019   Lab Results  Component Value Date   ALT 35 09/12/2019   AST 20 09/12/2019   ALKPHOS 77 09/12/2019   BILITOT 0.2 09/12/2019   Diabetes: Complicated by hyperglycemia and diabetic neuropathy. Last visit June 3.   Gabapentin stopped taking months ago, with no change in neruopathy. Glipizide tid 10 mg in the morning, 10 mg afternoon,  5 mg at night. Has been taking that dose, but before bed - not meal.  No symptomatic lows.   He was continued on Jardiance 25 mg daily, Metformin 500 mg 3 times daily.   He is on statin, ACE inhibitor as above. Home readings didn't test this morning. Fasting range 130-200.  Microalbumin: Normal ratio  12/31/2018 Optho, foot exam, pneumovax: Up-to-date  Has been working on diet/exercise.  Wt Readings from Last 3 Encounters:  01/20/20 192 lb (87.1 kg)  09/12/19 188 lb (85.3 kg)  06/12/19 189 lb (85.7 kg)    Lab Results  Component Value Date   HGBA1C 7.5 (H) 09/12/2019   HGBA1C 7.9 (H) 06/12/2019   HGBA1C 8.1 (H) 03/13/2019   Lab Results  Component Value Date   MICROALBUR 3.8 (H) 03/10/2014   LDLCALC 153 (H) 09/12/2019   CREATININE 0.98 09/12/2019   BPH/nocturia.  Diagnosed with urology, Dr.  in [place]. Procedure to shrink prostate a while ago.  No daytime symptoms of hesitancy, dribbling typically.  Nocturia 2 times per night. No dysuria.  Lab Results  Component Value Date   PSA1 2.7 09/12/2019   [facility]: [facility]rology Myrbetriq from Urology   HM: Fecal immunochemical test (FIT) negative on 04/03/19. Discussed Cologuard. No FH of colon CA. Discussed colonoscopy as well. He would like to weigh options and let me know.   Send cologuard with patinet  S/p  Moderna covid vaccine x 2.   History Patient Active Problem List   Diagnosis Date Noted  . Diabetes mellitus type II 06/02/2011  . Hypertension 06/02/2011  . Hyperlipemia 06/02/2011  . Cataract 06/02/2011   Past Medical History:  Diagnosis Date  . Cataract   . Diabetes mellitus without complication (  Subjective:  Patient ID: Andrew Tanner, male    DOB: November 01, 1950  Age: 69 y.o. MRN: [id number]  CC:  Chief Complaint  Patient presents with  . Diabetes  . Hyperlipidemia  . R abd pain    intermittent once every 2 to 3 weeks lasting 1 full day    HPI Rino Hosea presents for medication follow up with a medical history notable for DM, HLD and HTN.  He has noticed a dull ache right lower quadrant, once every 2-3 weeks. Episodal, lasts about an hour. He believes this may be connected to his Diet Coke intake with his medications. He plans on stopping Diet coke to see if this improves. He denies nausea, vomiting, diarrhea, blood in stool, pain on palpation.   Hypertension: Lisinopril HCTZ 20/25 mg daily. BP at goal ,140/90 Home readings: stable, continuous monitoring with smart watch. No new side effects. Compliant with meds.  BP Readings from Last 3 Encounters:  01/20/20 133/86  09/12/19 119/85  06/12/19 133/80   Lab Results  Component Value Date   CREATININE 0.98 09/12/2019   Hyperlipidemia: Lipitor 40 mg daily, no new myalgias or side effects. Compliant with daily use.   Lab Results  Component Value Date   CHOL 221 (H) 09/12/2019   HDL 45 09/12/2019   LDLCALC 153 (H) 09/12/2019   TRIG 130 09/12/2019   CHOLHDL 4.9 09/12/2019   Lab Results  Component Value Date   ALT 35 09/12/2019   AST 20 09/12/2019   ALKPHOS 77 09/12/2019   BILITOT 0.2 09/12/2019   Diabetes: Complicated by hyperglycemia and diabetic neuropathy. Last visit June 3.   Gabapentin stopped taking months ago, with no change in neruopathy. Glipizide tid 10 mg in the morning, 10 mg afternoon,  5 mg at night. Has been taking that dose, but before bed - not meal.  No symptomatic lows.   He was continued on Jardiance 25 mg daily, Metformin 500 mg 3 times daily.   He is on statin, ACE inhibitor as above. Home readings didn't test this morning. Fasting range 130-200.  Microalbumin: Normal ratio  12/31/2018 Optho, foot exam, pneumovax: Up-to-date  Has been working on diet/exercise.  Wt Readings from Last 3 Encounters:  01/20/20 192 lb (87.1 kg)  09/12/19 188 lb (85.3 kg)  06/12/19 189 lb (85.7 kg)    Lab Results  Component Value Date   HGBA1C 7.5 (H) 09/12/2019   HGBA1C 7.9 (H) 06/12/2019   HGBA1C 8.1 (H) 03/13/2019   Lab Results  Component Value Date   MICROALBUR 3.8 (H) 03/10/2014   LDLCALC 153 (H) 09/12/2019   CREATININE 0.98 09/12/2019   BPH/nocturia.  Diagnosed with urology, Dr.  in [facility]. Procedure to shrink prostate a while ago.  No daytime symptoms of hesitancy, dribbling typically.  Nocturia 2 times per night. No dysuria.  Lab Results  Component Value Date   PSA1 2.7 09/12/2019   Dr. Gloriann Loan at [facility]: Urology Myrbetriq from Urology   HM: Fecal immunochemical test (FIT) negative on 04/03/19. Discussed Cologuard. No FH of colon CA. Discussed colonoscopy as well. He would like to weigh options and let me know.   Send cologuard with patinet  S/p  Moderna covid vaccine x 2.   History Patient Active Problem List   Diagnosis Date Noted  . Diabetes mellitus type II 06/02/2011  . Hypertension 06/02/2011  . Hyperlipemia 06/02/2011  . Cataract 06/02/2011   Past Medical History:  Diagnosis Date  . Cataract   . Diabetes mellitus without complication (

## 2020-01-20 NOTE — Patient Instructions (Addendum)
Will follow up with lab results Will send cologuard.  Health Maintenance After Age 70 After age 28, you are at a higher risk for certain long-term diseases and infections as well as injuries from falls. Falls are a major cause of broken bones and head injuries in people who are older than age 70. Getting regular preventive care can help to keep you healthy and well. Preventive care includes getting regular testing and making lifestyle changes as recommended by your health care provider. Talk with your health care provider about:  Which screenings and tests you should have. A screening is a test that checks for a disease when you have no symptoms.  A diet and exercise plan that is right for you. What should I know about screenings and tests to prevent falls? Screening and testing are the best ways to find a health problem early. Early diagnosis and treatment give you the best chance of managing medical conditions that are common after age 70. Certain conditions and lifestyle choices may make you more likely to have a fall. Your health care provider may recommend:  Regular vision checks. Poor vision and conditions such as cataracts can make you more likely to have a fall. If you wear glasses, make sure to get your prescription updated if your vision changes.  Medicine review. Work with your health care provider to regularly review all of the medicines you are taking, including over-the-counter medicines. Ask your health care provider about any side effects that may make you more likely to have a fall. Tell your health care provider if any medicines that you take make you feel dizzy or sleepy.  Osteoporosis screening. Osteoporosis is a condition that causes the bones to get weaker. This can make the bones weak and cause them to break more easily.  Blood pressure screening. Blood pressure changes and medicines to control blood pressure can make you feel dizzy.  Strength and balance checks. Your  health care provider may recommend certain tests to check your strength and balance while standing, walking, or changing positions.  Foot health exam. Foot pain and numbness, as well as not wearing proper footwear, can make you more likely to have a fall.  Depression screening. You may be more likely to have a fall if you have a fear of falling, feel emotionally low, or feel unable to do activities that you used to do.  Alcohol use screening. Using too much alcohol can affect your balance and may make you more likely to have a fall. What actions can I take to lower my risk of falls? General instructions  Talk with your health care provider about your risks for falling. Tell your health care provider if: ? You fall. Be sure to tell your health care provider about all falls, even ones that seem minor. ? You feel dizzy, sleepy, or off-balance.  Take over-the-counter and prescription medicines only as told by your health care provider. These include any supplements.  Eat a healthy diet and maintain a healthy weight. A healthy diet includes low-fat dairy products, low-fat (lean) meats, and fiber from whole grains, beans, and lots of fruits and vegetables. Home safety  Remove any tripping hazards, such as rugs, cords, and clutter.  Install safety equipment such as grab bars in bathrooms and safety rails on stairs.  Keep rooms and walkways well-lit. Activity   Follow a regular exercise program to stay fit. This will help you maintain your balance. Ask your health care provider what types of exercise are  appropriate for you.  If you need a cane or walker, use it as recommended by your health care provider.  Wear supportive shoes that have nonskid soles. Lifestyle  Do not drink alcohol if your health care provider tells you not to drink.  If you drink alcohol, limit how much you have: ? 0-1 drink a day for women. ? 0-2 drinks a day for men.  Be aware of how much alcohol is in your  drink. In the [place] one drink equals one typical bottle of beer (12 oz), one-half glass of wine (5 oz), or one shot of hard liquor (1 oz).  Do not use any products that contain nicotine or tobacco, such as cigarettes and e-cigarettes. If you need help quitting, ask your health care provider. Summary  Having a healthy lifestyle and getting preventive care can help to protect your health and wellness after age 79.  Screening and testing are the best way to find a health problem early and help you avoid having a fall. Early diagnosis and treatment give you the best chance for managing medical conditions that are more common for people who are older than age 70.  Falls are a major cause of broken bones and head injuries in people who are older than age 70. Take precautions to prevent a fall at home.  Work with your health care provider to learn what changes you can make to improve your health and wellness and to prevent falls. This information is not intended to replace advice given to you by your health care provider. Make sure you discuss any questions you have with your health care provider. Document Revised: 07/19/2018 Document Reviewed: 02/08/2017 Elsevier Patient Education  2020 [place].  Diabetes Basics  Diabetes (diabetes mellitus) is a long-term (chronic) disease. It occurs when the body does not properly use sugar (glucose) that is released from food after you eat. Diabetes may be caused by one or both of these problems:  Your pancreas does not make enough of a hormone called insulin.  Your body does not react in a normal way to insulin that it makes. Insulin lets sugars (glucose) go into cells in your body. This gives you energy. If you have diabetes, sugars cannot get into cells. This causes high blood sugar (hyperglycemia). Follow these instructions at home: How is diabetes treated? You may need to take insulin or other diabetes medicines daily to keep your blood sugar in  balance. Take your diabetes medicines every day as told by your doctor. List your diabetes medicines here: Diabetes medicines  Name of medicine: ______________________________ ? Amount (dose): _______________ Time (a.m./p.m.): _______________ Notes: ___________________________________  Name of medicine: ______________________________ ? Amount (dose): _______________ Time (a.m./p.m.): _______________ Notes: ___________________________________  Name of medicine: ______________________________ ? Amount (dose): _______________ Time (a.m./p.m.): _______________ Notes: ___________________________________ If you use insulin, you will learn how to give yourself insulin by injection. You may need to adjust the amount based on the food that you eat. List the types of insulin you use here: Insulin  Insulin type: ______________________________ ? Amount (dose): _______________ Time (a.m./p.m.): _______________ Notes: ___________________________________  Insulin type: ______________________________ ? Amount (dose): _______________ Time (a.m./p.m.): _______________ Notes: ___________________________________  Insulin type: ______________________________ ? Amount (dose): _______________ Time (a.m./p.m.): _______________ Notes: ___________________________________  Insulin type: ______________________________ ? Amount (dose): _______________ Time (a.m./p.m.): _______________ Notes: ___________________________________  Insulin type: ______________________________ ? Amount (dose): _______________ Time (a.m./p.m.): _______________ Notes: ___________________________________ How do I manage my blood sugar?  Check your blood sugar levels using a blood glucose monitor  as directed by your doctor. Your doctor will set treatment goals for you. Generally, you should have these blood sugar levels:  Before meals (preprandial): 80-130 mg/dL (4.4-7.2 mmol/L).  After meals (postprandial): below 180 mg/dL (10  mmol/L).  A1c level: less than 7%. Write down the times that you will check your blood sugar levels: Blood sugar checks  Time: _______________ Notes: ___________________________________  Time: _______________ Notes: ___________________________________  Time: _______________ Notes: ___________________________________  Time: _______________ Notes: ___________________________________  Time: _______________ Notes: ___________________________________  Time: _______________ Notes: ___________________________________  What do I need to know about low blood sugar? Low blood sugar is called hypoglycemia. This is when blood sugar is at or below 70 mg/dL (3.9 mmol/L). Symptoms may include:  Feeling: ? Hungry. ? Worried or nervous (anxious). ? Sweaty and clammy. ? Confused. ? Dizzy. ? Sleepy. ? Sick to your stomach (nauseous).  Having: ? A fast heartbeat. ? A headache. ? A change in your vision. ? Tingling or no feeling (numbness) around the mouth, lips, or tongue. ? Jerky movements that you cannot control (seizure).  Having trouble with: ? Moving (coordination). ? Sleeping. ? Passing out (fainting). ? Getting upset easily (irritability). Treating low blood sugar To treat low blood sugar, eat or drink something sugary right away. If you can think clearly and swallow safely, follow the 15:15 rule:  Take 15 grams of a fast-acting carb (carbohydrate). Talk with your doctor about how much you should take.  Some fast-acting carbs are: ? Sugar tablets (glucose pills). Take 3-4 glucose pills. ? 6-8 pieces of hard candy. ? 4-6 oz (120-150 mL) of fruit juice. ? 4-6 oz (120-150 mL) of regular (not diet) soda. ? 1 Tbsp (15 mL) honey or sugar.  Check your blood sugar 15 minutes after you take the carb.  If your blood sugar is still at or below 70 mg/dL (3.9 mmol/L), take 15 grams of a carb again.  If your blood sugar does not go above 70 mg/dL (3.9 mmol/L) after 3 tries, get help  right away.  After your blood sugar goes back to normal, eat a meal or a snack within 1 hour. Treating very low blood sugar If your blood sugar is at or below 54 mg/dL (3 mmol/L), you have very low blood sugar (severe hypoglycemia). This is an emergency. Do not wait to see if the symptoms will go away. Get medical help right away. Call your local emergency services (911 in the [place] Do not drive yourself to the hospital. Questions to ask your health care provider  Do I need to meet with a diabetes educator?  What equipment will I need to care for myself at home?  What diabetes medicines do I need? When should I take them?  How often do I need to check my blood sugar?  What number can I call if I have questions?  When is my next doctor's visit?  Where can I find a support group for people with diabetes? Where to find more information  American Diabetes Association: www.diabetes.org  American Association of Diabetes Educators: www.diabeteseducator.org/patient-resources Contact a doctor if:  Your blood sugar is at or above 240 mg/dL (13.3 mmol/L) for 2 days in a row.  You have been sick or have had a fever for 2 days or more, and you are not getting better.  You have any of these problems for more than 6 hours: ? You cannot eat or drink. ? You feel sick to your stomach (nauseous). ? You throw up (vomit). ?  You have watery poop (diarrhea). Get help right away if:  Your blood sugar is lower than 54 mg/dL (3 mmol/L).  You get confused.  You have trouble: ? Thinking clearly. ? Breathing. Summary  Diabetes (diabetes mellitus) is a long-term (chronic) disease. It occurs when the body does not properly use sugar (glucose) that is released from food after digestion.  Take insulin and diabetes medicines as told.  Check your blood sugar every day, as often as told.  Keep all follow-up visits as told by your doctor. This is important. This information is not intended to  replace advice given to you by your health care provider. Make sure you discuss any questions you have with your health care provider. Document Revised: 12/19/2018 Document Reviewed: 06/30/2017 Elsevier Patient Education  [employer]. rd with patient   If you have lab work done today you will be contacted with your lab results within the next 2 weeks.  If you have not heard from [place] then please contact us. The fastest way to get your results is to register for My Chart.   IF you received an x-ray today, you will receive an invoice from [facility]. Please contact [facility] at [phone] with questions or concerns regarding your invoice.   IF you received labwork today, you will receive an invoice from [facility]. Please contact [facility] at [phone] with questions or concerns regarding your invoice.   Our billing staff will not be able to assist you with questions regarding bills from these companies.  You will be contacted with the lab results as soon as they are available. The fastest way to get your results is to activate your My Chart account. Instructions are located on the last page of this paperwork. If you have not heard from [place] regarding the results in 2 weeks, please contact this office.

## 2020-01-21 LAB — HEMOGLOBIN A1C
Est. average glucose Bld gHb Est-mCnc: 177 mg/dL
Hgb A1c MFr Bld: 7.8 % — ABNORMAL HIGH (ref 4.8–5.6)

## 2020-01-21 LAB — CMP14+EGFR
ALT: 35 IU/L (ref 0–44)
AST: 28 IU/L (ref 0–40)
Albumin/Globulin Ratio: 2.1 (ref 1.2–2.2)
Albumin: 4.4 g/dL (ref 3.8–4.8)
Alkaline Phosphatase: 71 IU/L (ref 44–121)
BUN/Creatinine Ratio: 22 (ref 10–24)
BUN: 22 mg/dL (ref 8–27)
Bilirubin Total: 0.3 mg/dL (ref 0.0–1.2)
CO2: 22 mmol/L (ref 20–29)
Calcium: 10.2 mg/dL (ref 8.6–10.2)
Chloride: 98 mmol/L (ref 96–106)
Creatinine, Ser: 0.99 mg/dL (ref 0.76–1.27)
GFR calc Af Amer: 89 mL/min/{1.73_m2} (ref >59)
GFR calc non Af Amer: 77 mL/min/{1.73_m2} (ref >59)
Globulin, Total: 2.1 g/dL (ref 1.5–4.5)
Glucose: 167 mg/dL — ABNORMAL HIGH (ref 65–99)
Potassium: 4.3 mmol/L (ref 3.5–5.2)
Sodium: 134 mmol/L (ref 134–144)
Total Protein: 6.5 g/dL (ref 6.0–8.5)

## 2020-01-21 LAB — LIPID PANEL
Chol/HDL Ratio: 4.9 ratio (ref 0.0–5.0)
Cholesterol, Total: 187 mg/dL (ref 100–199)
HDL: 38 mg/dL — ABNORMAL LOW (ref >39)
LDL Chol Calc (NIH): 107 mg/dL — ABNORMAL HIGH (ref 0–99)
Triglycerides: 246 mg/dL — ABNORMAL HIGH (ref 0–149)
VLDL Cholesterol Cal: 42 mg/dL — ABNORMAL HIGH (ref 5–40)

## 2020-01-21 MED ORDER — GLIPIZIDE 5 MG PO TABS: 15.0000 mg | ORAL_TABLET | Freq: Two times a day (BID) | ORAL | 1 refills | Status: DC

## 2020-01-21 NOTE — Addendum Note (Signed)
Addended by: Bari Edward on: 01/21/2020 04:56 PM   Modules accepted: Orders

## 2020-01-22 DIAGNOSIS — F3341 Major depressive disorder, recurrent, in partial remission: Secondary | ICD-10-CM | POA: Diagnosis not present

## 2020-01-27 ENCOUNTER — Encounter: Payer: Self-pay | Admitting: Family Medicine

## 2020-02-04 DIAGNOSIS — Z1211 Encounter for screening for malignant neoplasm of colon: Secondary | ICD-10-CM | POA: Diagnosis not present

## 2020-02-10 ENCOUNTER — Other Ambulatory Visit: Payer: Self-pay | Admitting: Family Medicine

## 2020-02-10 DIAGNOSIS — E785 Hyperlipidemia, unspecified: Secondary | ICD-10-CM

## 2020-02-10 DIAGNOSIS — I10 Essential (primary) hypertension: Secondary | ICD-10-CM

## 2020-02-10 NOTE — Telephone Encounter (Signed)
Requested Prescriptions  Pending Prescriptions Disp Refills  . JARDIANCE 25 MG TABS tablet [Pharmacy Med Name: Jardiance 25 MG Oral Tablet] 90 tablet 0    Sig: TAKE 1 TABLET BY MOUTH ONCE DAILY BEFORE BREAKFAST     Endocrinology:  Diabetes - SGLT2 Inhibitors Failed - 02/10/2020 12:16 PM      Failed - LDL in normal range and within 360 days    LDL Chol Calc (NIH)  Date Value Ref Range Status  01/20/2020 107 (H) 0 - 99 mg/dL Final         Passed - Cr in normal range and within 360 days    Creat  Date Value Ref Range Status  03/10/2014 0.88 0.50 - 1.35 mg/dL Final   Creatinine, Ser  Date Value Ref Range Status  01/20/2020 0.99 0.76 - 1.27 mg/dL Final         Passed - HBA1C is between 0 and 7.9 and within 180 days    Hgb A1c MFr Bld  Date Value Ref Range Status  01/20/2020 7.8 (H) 4.8 - 5.6 % Final    Comment:             Prediabetes: 5.7 - 6.4          Diabetes: >6.4          Glycemic control for adults with diabetes: <7.0          Passed - eGFR in normal range and within 360 days    GFR, Est African American  Date Value Ref Range Status  03/10/2014 >89 mL/min Final   GFR calc Af Amer  Date Value Ref Range Status  01/20/2020 89 >59 mL/min/1.73 Final    Comment:    **[facility] currently reports eGFR in compliance with the current**   recommendations of the [employer]. [facility] will   update reporting as new guidelines are published from the NKF-ASN   Task force.    GFR, Est Non African American  Date Value Ref Range Status  03/10/2014 >89 mL/min Final    Comment:      The estimated GFR is a calculation valid for adults (>=56 years old) that uses the CKD-EPI algorithm to adjust for age and sex. It is   not to be used for children, pregnant women, hospitalized patients,    patients on dialysis, or with rapidly changing kidney function. According to the NKDEP, eGFR >89 is normal, 60-89 shows mild impairment, 30-59 shows moderate impairment, 15-29 shows  severe impairment and <15 is ESRD.      GFR calc non Af Amer  Date Value Ref Range Status  01/20/2020 77 >59 mL/min/1.73 Final         Passed - Valid encounter within last 6 months    Recent Outpatient Visits          3 weeks ago Type 2 diabetes mellitus with hyperglycemia, without long-term current use of insulin ([place])   [facility] [place] [facility], Laurita Quint, FNP   5 months ago Type 2 diabetes mellitus with hyperglycemia, without long-term current use of insulin [facility])   [facility] [place], [name], MD   8 months ago Type 2 diabetes mellitus with hyperglycemia, without long-term current use of insulin [facility])   [facility] [place], [name], MD   11 months ago Type 2 diabetes mellitus with hyperglycemia, without long-term current use of insulin [facility])   [facility] [place], [name], MD   1 year ago  Type 2 diabetes mellitus with hyperglycemia, without long-term current use of insulin [facility])   [facility] [place], [name], MD             . lisinopril-hydrochlorothiazide (ZESTORETIC) 20-25 MG tablet [Pharmacy Med Name: Lisinopril-hydroCHLOROthiazide 20-25 MG Oral Tablet] 90 tablet 1    Sig: Take 1 tablet by mouth once daily     Cardiovascular:  ACEI + Diuretic Combos Passed - 02/10/2020 12:16 PM      Passed - Na in normal range and within 180 days    Sodium  Date Value Ref Range Status  01/20/2020 134 134 - 144 mmol/L Final         Passed - K in normal range and within 180 days    Potassium  Date Value Ref Range Status  01/20/2020 4.3 3.5 - 5.2 mmol/L Final         Passed - Cr in normal range and within 180 days    Creat  Date Value Ref Range Status  03/10/2014 0.88 0.50 - 1.35 mg/dL Final   Creatinine, Ser  Date Value Ref Range Status  01/20/2020 0.99 0.76 - 1.27 mg/dL Final         Passed - Ca in normal range and within 180 days    Calcium  Date Value Ref Range Status  01/20/2020 10.2 8.6 - 10.2 mg/dL Final          Passed - Patient is not pregnant      Passed - Last BP in normal range    BP Readings from Last 1 Encounters:  01/20/20 133/86         Passed - Valid encounter within last 6 months    Recent Outpatient Visits          3 weeks ago Type 2 diabetes mellitus with hyperglycemia, without long-term current use of insulin ([place])   [facility] [place], Laurita Quint, FNP   5 months ago Type 2 diabetes mellitus with hyperglycemia, without long-term current use of insulin ([place])   [facility] [place], [name], MD   8 months ago Type 2 diabetes mellitus with hyperglycemia, without long-term current use of insulin ([place])   [facility] [place] [facility], [name], MD   11 months ago Type 2 diabetes mellitus with hyperglycemia, without long-term current use of insulin [facility])   [facility] [place], [name], MD   1 year ago Type 2 diabetes mellitus with hyperglycemia, without long-term current use of insulin [facility])   [facility] [place], [name], MD             . atorvastatin (LIPITOR) 20 MG tablet [Pharmacy Med Name: Atorvastatin Calcium 20 MG Oral Tablet] 90 tablet 3    Sig: TAKE 1 TABLET BY MOUTH ONCE DAILY AT  6  PM     Cardiovascular:  Antilipid - Statins Failed - 02/10/2020 12:16 PM      Failed - LDL in normal range and within 360 days    LDL Chol Calc (NIH)  Date Value Ref Range Status  01/20/2020 107 (H) 0 - 99 mg/dL Final         Failed - HDL in normal range and within 360 days    HDL  Date Value Ref Range Status  01/20/2020 38 (L) >39 mg/dL Final         Failed - Triglycerides in normal range and within 360 days    Triglycerides  Date Value Ref Range Status  01/20/2020 246 (H) 0 - 149 mg/dL Final         Passed - Total Cholesterol in normal range and within 360 days    Cholesterol, Total  Date Value Ref Range Status  01/20/2020 187 100 - 199 mg/dL Final         Passed - Patient is not pregnant      Passed - Valid encounter within  last 12 months    Recent Outpatient Visits          3 weeks ago Type 2 diabetes mellitus with hyperglycemia, without long-term current use of insulin ([place])   [facility] [place] [facility], Laurita Quint, FNP   5 months ago Type 2 diabetes mellitus with hyperglycemia, without long-term current use of insulin [facility])   [facility] [place], [name], MD   8 months ago Type 2 diabetes mellitus with hyperglycemia, without long-term current use of insulin [facility])   [facility] [place], [name], MD   11 months ago Type 2 diabetes mellitus with hyperglycemia, without long-term current use of insulin [facility])   [facility] [place], [name], MD   1 year ago Type 2 diabetes mellitus with hyperglycemia, without long-term current use of insulin [facility])   [facility] [place], [name], MD

## 2020-02-18 LAB — COLOGUARD: COLOGUARD: POSITIVE — AB

## 2020-02-19 ENCOUNTER — Encounter: Payer: Self-pay | Admitting: Family Medicine

## 2020-02-19 ENCOUNTER — Ambulatory Visit (INDEPENDENT_AMBULATORY_CARE_PROVIDER_SITE_OTHER): Payer: Medicare Other | Admitting: Family Medicine

## 2020-02-19 ENCOUNTER — Other Ambulatory Visit: Payer: Self-pay

## 2020-02-19 VITALS — BP 121/79 | HR 86 | Temp 98.3°F | Ht 71.0 in | Wt 195.0 lb

## 2020-02-19 DIAGNOSIS — F32A Depression, unspecified: Secondary | ICD-10-CM

## 2020-02-19 DIAGNOSIS — R195 Other fecal abnormalities: Secondary | ICD-10-CM | POA: Diagnosis not present

## 2020-02-19 DIAGNOSIS — R1031 Right lower quadrant pain: Secondary | ICD-10-CM | POA: Diagnosis not present

## 2020-02-19 LAB — POCT URINALYSIS DIP (MANUAL ENTRY)
Bilirubin, UA: NEGATIVE
Blood, UA: NEGATIVE
Glucose, UA: NEGATIVE mg/dL
Ketones, POC UA: NEGATIVE mg/dL
Leukocytes, UA: NEGATIVE
Nitrite, UA: NEGATIVE
Protein Ur, POC: NEGATIVE mg/dL
Spec Grav, UA: 1.01 (ref 1.010–1.025)
Urobilinogen, UA: 0.2 E.U./dL
pH, UA: 5 (ref 5.0–8.0)

## 2020-02-19 LAB — POC MICROSCOPIC URINALYSIS (UMFC): Mucus: ABSENT

## 2020-02-19 LAB — COLOGUARD: Cologuard: POSITIVE — AB

## 2020-02-19 NOTE — Progress Notes (Signed)
Subjective:  Patient ID: Andrew Tanner, male    DOB: 10-31-1950  Age: 69 y.o. MRN: [id number]  CC:  Chief Complaint  Patient presents with  . Abdominal Pain    Pt reports pain in his lower R abdominal area. Pt reports no issue urinating or deficating, but has noticed the pain happens a night when the pt gets up to urinate. Pt states the pain comes and gos, but is more precent at night. pt isn't currently having the pain. Pt reports he has completes a cologuard screening and senting it in on Feb 04, 2020 and hasn't seen or heard the results as of yet.    HPI [name] presents for   Lower abdominal pain Right-sided.  Noticed more at night, notices when he does get up to urinate but denies any difficulty with urination or defecation.  Still intermittent, not during day.  Currently asymptomatic. BM daily. No painful defecation. No melena/hematochezia.  No N/V.no fever.  Nocturia stable - 1-2 per night.  Cologuard testing was completed October 27, did return as a positive test. Symptoms were discussed at his October 11 visit with my colleague.  At that time he had a dull ache in his right lower quadrant every 2 to 3 weeks, episodic lasting about an hour.  Thought to be connected to Diet Coke intake with his medications.  Plan to discontinue Diet Coke.  He does have a history of BPH followed by urology -Dr. Gloriann Loan at [facility].  Has been treated with Myrbetriq, Flomax.  Glipizide increased to 15mg  bid after recent A1c at 7.8. remains on metformin and jardiance.  No symptomatic lows on new regimen.  Feels like stress/depression worsening at similar time as abdominal pain. Abdomen feels better once he is up and active. Less diet adherence as eating out more with work at home.   Depression: On meds, followed by psychiatry- Dr. Casimiro Needle.  abilify stopped few weeks ago.  House renovation, unable to paint in studio. Has been affecting his mood, and unable to paint right now. Has setup  computer at wife's office, but trouble with actual painting. Will be able to return in next month.  Counselor, but has not called recently.  Cleaning of old things at home has felt to make energized.  No SI.    Depression screen [facility] 2/9 02/19/2020 01/20/2020 09/12/2019 06/12/2019 03/13/2019  Decreased Interest 2 0 0 0 0  Down, Depressed, Hopeless 1 0 0 0 0  PHQ - 2 Score 3 0 0 0 0  Altered sleeping 0 - - - -  Tired, decreased energy 3 - - - -  Change in appetite 3 - - - -  Feeling bad or failure about yourself  1 - - - -  Trouble concentrating 2 - - - -  Moving slowly or fidgety/restless 0 - - - -  Suicidal thoughts 0 - - - -  PHQ-9 Score 12 - - - -  Difficult doing work/chores - - - - -     History Patient Active Problem List   Diagnosis Date Noted  . Diabetes mellitus type II 06/02/2011  . Hypertension 06/02/2011  . Hyperlipemia 06/02/2011  . Cataract 06/02/2011   Past Medical History:  Diagnosis Date  . Cataract   . Diabetes mellitus without complication ([place])   . Hyperlipidemia   . Hypertension    Past Surgical History:  Procedure Laterality Date  . HERNIA REPAIR    . NECK SURGERY    . SPINE SURGERY    .  TRANSURETHRAL RESECTION OF PROSTATE    . TURP VAPORIZATION     No Known Allergies Prior to Admission medications   Medication Sig Start Date End Date Taking? Authorizing Provider  alfuzosin (UROXATRAL) 10 MG 24 hr tablet Take 1 tablet by mouth once daily with breakfast 12/23/19  Yes [name], MD  ARIPiprazole (ABILIFY) 2 MG tablet Take 2 mg by mouth daily.   Yes [provider]  atorvastatin (LIPITOR) 20 MG tablet TAKE 1 TABLET BY MOUTH ONCE DAILY AT  6  PM 02/10/20  Yes [name], MD  citalopram (CELEXA) 40 MG tablet Take 40 mg by mouth daily.   Yes [provider]  clonazePAM (KLONOPIN) 0.5 MG tablet Take 0.5 mg by mouth 3 (three) times daily as needed.   Yes [provider]  gabapentin (NEURONTIN) 300 MG capsule Take 1  capsule (300 mg total) by mouth 2 (two) times daily. 12/31/18  Yes [name], MD  glipiZIDE (GLUCOTROL) 5 MG tablet Take 3 tablets (15 mg total) by mouth 2 (two) times daily before a meal. 01/21/20  Yes Just, Laurita Quint, FNP  glucose blood test strip 1 each by Other route daily. Use as instructed 07/26/17  Yes [name], MD  hydrocortisone 2.5 % ointment APPLY TO AFFECTED AREA EVERYDAY AT BEDTIME 08/19/19  Yes Lavonna Monarch, MD  ibuprofen (ADVIL,MOTRIN) 800 MG tablet Take 1 tablet (800 mg total) by mouth every 8 (eight) hours as needed for pain. 01/09/12  Yes Shawnee Knapp, MD  JARDIANCE 25 MG TABS tablet TAKE 1 TABLET BY MOUTH ONCE DAILY BEFORE BREAKFAST 02/10/20  Yes [name], MD  lisinopril-hydrochlorothiazide (ZESTORETIC) 20-25 MG tablet Take 1 tablet by mouth once daily 02/10/20  Yes [name], MD  metFORMIN (GLUCOPHAGE) 500 MG tablet TAKE 1 TABLET BY MOUTH THREE TIMES DAILY 12/11/19  Yes [name], MD  Multiple Vitamin (MULTIVITAMIN) tablet Take 1 tablet by mouth daily.   Yes [provider]  MYRBETRIQ 50 MG TB24 tablet Take 50 mg by mouth daily. 01/16/20  Yes [provider]   Social History   Socioeconomic History  . Marital status: Married    Spouse name: [name]  . Number of children: Not on file  . Years of education: Not on file  . Highest education level: Not on file  Occupational History  . Not on file  Tobacco Use  . Smoking status: Former Research scientist (life sciences)  . Smokeless tobacco: Never Used  . Tobacco comment: quit 28 yrs ago  Substance and Sexual Activity  . Alcohol use: Yes    Comment: 1 glass wine per week  . Drug use: No  . Sexual activity: Never  Other Topics Concern  . Not on file  Social History Narrative  . Not on file   Social Determinants of Health   Financial Resource Strain:   . Difficulty of Paying Living Expenses: Not on file  Food Insecurity:   . Worried About Charity fundraiser in the Last Year: Not on  file  . Ran Out of Food in the Last Year: Not on file  Transportation Needs:   . Lack of Transportation (Medical): Not on file  . Lack of Transportation (Non-Medical): Not on file  Physical Activity:   . Days of Exercise per Week: Not on file  . Minutes of Exercise per Session: Not on file  Stress:   . Feeling of Stress : Not on file  Social Connections:   .  Frequency of Communication with Friends and Family: Not on file  . Frequency of Social Gatherings with Friends and Family: Not on file  . Attends Religious Services: Not on file  . Active Member of Clubs or Organizations: Not on file  . Attends Archivist Meetings: Not on file  . Marital Status: Not on file  Intimate Partner Violence:   . Fear of Current or Ex-Partner: Not on file  . Emotionally Abused: Not on file  . Physically Abused: Not on file  . Sexually Abused: Not on file    Review of Systems Per hpi  Objective:   Vitals:   02/19/20 0921  BP: 121/79  Pulse: 86  Temp: 98.3 F (36.8 C)  TempSrc: Temporal  SpO2: 96%  Weight: 195 lb (88.5 kg)  Height: 5\' 11"  (1.803 m)     Physical Exam Vitals reviewed.  Constitutional:      Appearance: He is well-developed.  HENT:     Head: Normocephalic and atraumatic.  Eyes:     Pupils: Pupils are equal, round, and reactive to light.  Neck:     Vascular: No carotid bruit or JVD.  Cardiovascular:     Rate and Rhythm: Normal rate and regular rhythm.     Heart sounds: Normal heart sounds. No murmur heard.   Pulmonary:     Effort: Pulmonary effort is normal.     Breath sounds: Normal breath sounds. No rales.  Abdominal:     General: Bowel sounds are normal.     Palpations: Abdomen is soft.     Tenderness: There is no abdominal tenderness. There is no right CVA tenderness, left CVA tenderness, guarding or rebound.  Skin:    General: Skin is warm and dry.  Neurological:     Mental Status: He is alert and oriented to person, place, and time.    Psychiatric:        Mood and Affect: Mood normal.        Behavior: Behavior normal.   36 minutes spent during visit, greater than 50% counseling and assimilation of information, chart review, and discussion of plan.   Results for orders placed or performed in visit on 02/19/20  POCT urinalysis dipstick  Result Value Ref Range   Color, UA yellow yellow   Clarity, UA clear clear   Glucose, UA negative negative mg/dL   Bilirubin, UA negative negative   Ketones, POC UA negative negative mg/dL   Spec Grav, UA 1.010 1.010 - 1.025   Blood, UA negative negative   pH, UA 5.0 5.0 - 8.0   Protein Ur, POC negative negative mg/dL   Urobilinogen, UA 0.2 0.2 or 1.0 E.U./dL   Nitrite, UA Negative Negative   Leukocytes, UA Negative Negative  POCT Microscopic Urinalysis ([facility])  Result Value Ref Range   WBC,UR,HPF,POC None None WBC/hpf   RBC,UR,HPF,POC None None RBC/hpf   Bacteria None None, Too numerous to count   Mucus Absent Absent   Epithelial Cells, UR Per Microscopy Few (A) None, Too numerous to count cells/hpf     Assessment & Plan:  Jassiah Viviano is a 69 y.o. male . RLQ abdominal pain - Plan: Ambulatory referral to Gastroenterology, CBC, POCT urinalysis dipstick, POCT Microscopic Urinalysis ([facility]) Positive colorectal cancer screening using Cologuard test - Plan: Ambulatory referral to Gastroenterology  -Intermittent right lower quadrant symptoms, reassuring that symptoms are not present during the day and asymptomatic on exam currently.  Seem to have correlated with his increased depression symptoms, changes at home.  Change in diet at same time.  Follow-up with his counselor, psychiatrist recommended, but will also refer to gastroenterology given positive Cologuard screening test.  Check CBC.  Hold on imaging at this time but ER precautions given.  Depression, unspecified depression type  -As above, recommended follow-up with his therapist, psychiatrist to determine if change in  medications.  Other coping techniques discussed.  RTC precautions.  No orders of the defined types were placed in this encounter.  Patient Instructions   It may worth reaching out to your counselor to discuss recent symptoms, as well as psychiatrist if you feel like meds need to be restarted or adjusted.Return to the clinic or go to the nearest emergency room if any of your symptoms worsen or new symptoms occur.  I will refer you to gastroenterology to discuss a positive Cologuard screening test as well as the right lower quadrant abdominal symptoms.  Exam is reassuring at this time.  Talking to your counselor and psychiatrist may also be beneficial with abdominal symptoms as those seem to have started at a similar time as your depression symptoms have worsened.  Return to the clinic or go to the nearest emergency room if any of your symptoms worsen or new symptoms occur.   Abdominal Pain, Adult Pain in the abdomen (abdominal pain) can be caused by many things. Often, abdominal pain is not serious and it gets better with no treatment or by being treated at home. However, sometimes abdominal pain is serious. Your health care provider will ask questions about your medical history and do a physical exam to try to determine the cause of your abdominal pain. Follow these instructions at home:  Medicines  Take over-the-counter and prescription medicines only as told by your health care provider.  Do not take a laxative unless told by your health care provider. General instructions  Watch your condition for any changes.  Drink enough fluid to keep your urine pale yellow.  Keep all follow-up visits as told by your health care provider. This is important. Contact a health care provider if:  Your abdominal pain changes or gets worse.  You are not hungry or you lose weight without trying.  You are constipated or have diarrhea for more than 2-3 days.  You have pain when you urinate or have  a bowel movement.  Your abdominal pain wakes you up at night.  Your pain gets worse with meals, after eating, or with certain foods.  You are vomiting and cannot keep anything down.  You have a fever.  You have blood in your urine. Get help right away if:  Your pain does not go away as soon as your health care provider told you to expect.  You cannot stop vomiting.  Your pain is only in areas of the abdomen, such as the right side or the left lower portion of the abdomen. Pain on the right side could be caused by appendicitis.  You have bloody or black stools, or stools that look like tar.  You have severe pain, cramping, or bloating in your abdomen.  You have signs of dehydration, such as: ? Dark urine, very little urine, or no urine. ? Cracked lips. ? Dry mouth. ? Sunken eyes. ? Sleepiness. ? Weakness.  You have trouble breathing or chest pain. Summary  Often, abdominal pain is not serious and it gets better with no treatment or by being treated at home. However, sometimes abdominal pain is serious.  Watch your condition for any changes.  Take over-the-counter and prescription medicines only as told by your health care provider.  Contact a health care provider if your abdominal pain changes or gets worse.  Get help right away if you have severe pain, cramping, or bloating in your abdomen. This information is not intended to replace advice given to you by your health care provider. Make sure you discuss any questions you have with your health care provider. Document Revised: 08/06/2018 Document Reviewed: 08/06/2018 Elsevier Patient Education  [employer].    If you have lab work done today you will be contacted with your lab results within the next 2 weeks.  If you have not heard from [place] then please contact us. The fastest way to get your results is to register for My Chart.   IF you received an x-ray today, you will receive an invoice from [facility]. Please contact [facility] at [phone] with questions or concerns regarding your invoice.   IF you received labwork today, you will receive an invoice from [place]. Please contact [facility] at [phone] with questions or concerns regarding your invoice.   Our billing staff will not be able to assist you with questions regarding bills from these companies.  You will be contacted with the lab results as soon as they are available. The fastest way to get your results is to activate your My Chart account. Instructions are located on the last page of this paperwork. If you have not heard from [place] regarding the results in 2 weeks, please contact this office.         Signed, [name], MD [facility]

## 2020-02-19 NOTE — Patient Instructions (Addendum)
It may worth reaching out to your counselor to discuss recent symptoms, as well as psychiatrist if you feel like meds need to be restarted or adjusted.Return to the clinic or go to the nearest emergency room if any of your symptoms worsen or new symptoms occur.  I will refer you to gastroenterology to discuss a positive Cologuard screening test as well as the right lower quadrant abdominal symptoms.  Exam is reassuring at this time.  Talking to your counselor and psychiatrist may also be beneficial with abdominal symptoms as those seem to have started at a similar time as your depression symptoms have worsened.  Return to the clinic or go to the nearest emergency room if any of your symptoms worsen or new symptoms occur.   Abdominal Pain, Adult Pain in the abdomen (abdominal pain) can be caused by many things. Often, abdominal pain is not serious and it gets better with no treatment or by being treated at home. However, sometimes abdominal pain is serious. Your health care provider will ask questions about your medical history and do a physical exam to try to determine the cause of your abdominal pain. Follow these instructions at home:  Medicines  Take over-the-counter and prescription medicines only as told by your health care provider.  Do not take a laxative unless told by your health care provider. General instructions  Watch your condition for any changes.  Drink enough fluid to keep your urine pale yellow.  Keep all follow-up visits as told by your health care provider. This is important. Contact a health care provider if:  Your abdominal pain changes or gets worse.  You are not hungry or you lose weight without trying.  You are constipated or have diarrhea for more than 2-3 days.  You have pain when you urinate or have a bowel movement.  Your abdominal pain wakes you up at night.  Your pain gets worse with meals, after eating, or with certain foods.  You are vomiting and  cannot keep anything down.  You have a fever.  You have blood in your urine. Get help right away if:  Your pain does not go away as soon as your health care provider told you to expect.  You cannot stop vomiting.  Your pain is only in areas of the abdomen, such as the right side or the left lower portion of the abdomen. Pain on the right side could be caused by appendicitis.  You have bloody or black stools, or stools that look like tar.  You have severe pain, cramping, or bloating in your abdomen.  You have signs of dehydration, such as: ? Dark urine, very little urine, or no urine. ? Cracked lips. ? Dry mouth. ? Sunken eyes. ? Sleepiness. ? Weakness.  You have trouble breathing or chest pain. Summary  Often, abdominal pain is not serious and it gets better with no treatment or by being treated at home. However, sometimes abdominal pain is serious.  Watch your condition for any changes.  Take over-the-counter and prescription medicines only as told by your health care provider.  Contact a health care provider if your abdominal pain changes or gets worse.  Get help right away if you have severe pain, cramping, or bloating in your abdomen. This information is not intended to replace advice given to you by your health care provider. Make sure you discuss any questions you have with your health care provider. Document Revised: 08/06/2018 Document Reviewed: 08/06/2018 Elsevier Patient Education  2020 Elsevier  Inc.    If you have lab work done today you will be contacted with your lab results within the next 2 weeks.  If you have not heard from [place] then please contact us. The fastest way to get your results is to register for My Chart.   IF you received an x-ray today, you will receive an invoice from [facility]. Please contact [facility] at [phone] with questions or concerns regarding your invoice.   IF you received labwork today, you will receive  an invoice from [facility]. Please contact [facility] at [phone] with questions or concerns regarding your invoice.   Our billing staff will not be able to assist you with questions regarding bills from these companies.  You will be contacted with the lab results as soon as they are available. The fastest way to get your results is to activate your My Chart account. Instructions are located on the last page of this paperwork. If you have not heard from [place] regarding the results in 2 weeks, please contact this office.

## 2020-02-20 LAB — CBC
Hematocrit: 49.2 % (ref 37.5–51.0)
Hemoglobin: 16.1 g/dL (ref 13.0–17.7)
MCH: 29.7 pg (ref 26.6–33.0)
MCHC: 32.7 g/dL (ref 31.5–35.7)
MCV: 91 fL (ref 79–97)
Platelets: 213 10*3/uL (ref 150–450)
RBC: 5.43 x10E6/uL (ref 4.14–5.80)
RDW: 12.3 % (ref 11.6–15.4)
WBC: 7.9 10*3/uL (ref 3.4–10.8)

## 2020-02-21 ENCOUNTER — Encounter: Payer: Self-pay | Admitting: Family Medicine

## 2020-02-24 ENCOUNTER — Encounter: Payer: Self-pay | Admitting: Nurse Practitioner

## 2020-02-24 DIAGNOSIS — F3341 Major depressive disorder, recurrent, in partial remission: Secondary | ICD-10-CM | POA: Diagnosis not present

## 2020-02-26 ENCOUNTER — Telehealth: Payer: Self-pay | Admitting: Family Medicine

## 2020-02-26 NOTE — Telephone Encounter (Signed)
[employer] calling to confirm that we received abnormal Results on this patient    Please call back to confirm [phone] Case # [id number]

## 2020-02-26 NOTE — Telephone Encounter (Signed)
Error

## 2020-02-27 NOTE — Telephone Encounter (Signed)
Yes, see last visit with me. Discussed and referred to gastroenterology.

## 2020-03-04 DIAGNOSIS — R350 Frequency of micturition: Secondary | ICD-10-CM | POA: Diagnosis not present

## 2020-03-04 DIAGNOSIS — R300 Dysuria: Secondary | ICD-10-CM | POA: Diagnosis not present

## 2020-03-04 DIAGNOSIS — N401 Enlarged prostate with lower urinary tract symptoms: Secondary | ICD-10-CM | POA: Diagnosis not present

## 2020-03-04 DIAGNOSIS — R351 Nocturia: Secondary | ICD-10-CM | POA: Diagnosis not present

## 2020-03-13 DIAGNOSIS — F3341 Major depressive disorder, recurrent, in partial remission: Secondary | ICD-10-CM | POA: Diagnosis not present

## 2020-03-16 ENCOUNTER — Ambulatory Visit: Payer: Medicare Other | Admitting: Nurse Practitioner

## 2020-03-16 ENCOUNTER — Encounter: Payer: Self-pay | Admitting: Nurse Practitioner

## 2020-03-16 VITALS — BP 114/70 | HR 96 | Ht 71.0 in | Wt 191.0 lb

## 2020-03-16 DIAGNOSIS — R1031 Right lower quadrant pain: Secondary | ICD-10-CM

## 2020-03-16 DIAGNOSIS — G8929 Other chronic pain: Secondary | ICD-10-CM | POA: Diagnosis not present

## 2020-03-16 DIAGNOSIS — R195 Other fecal abnormalities: Secondary | ICD-10-CM | POA: Diagnosis not present

## 2020-03-16 MED ORDER — PLENVU 140 G PO SOLR: 1.0000 | Freq: Once | ORAL | 0 refills | Status: AC

## 2020-03-16 NOTE — Progress Notes (Signed)
Reviewed and agree with management plans. ? ?Cledis Sohn L. Blandon Offerdahl, MD, MPH  ?

## 2020-03-16 NOTE — Progress Notes (Signed)
03/16/2020 [place] [id number] 1950/08/27   CHIEF COMPLAINT: Positive Cologuard test   HISTORY OF PRESENT ILLNESS:  Andrew Tanner is a 69 year old male with a past medical history of depression, hypertension, hyperlipidemia, DM II and interstitial cystitis. He was referred to our office by Dr. Carlota Raspberry for further evaluation regarding RLQ abdominal pain and to schedule a colonoscopy due to having a positive Cologuard test on 02/05/2020.  His wife is present.  He reports having intermittent right lower quadrant abdominal pain for several years which lasts for 10 minutes then gone for weeks at a time.   No severe abdominal pain no obvious food or stress triggers.  No fever, sweats or chills.  No unintentional weight loss.  He denies having any right lower quadrant abdominal pain for the past few weeks.  He is passing normal formed brown bowel movement daily.  No rectal bleeding or melena.  No upper or lower abdominal pain.  He denies ever having a colonoscopy.  No known family history of colon polyps or colon cancer.  No dysphagia or heartburn.  Rare NSAID use. No other complaints at this time.   CBC Latest Ref Rng & Units 02/19/2020 01/09/2012  WBC 3.4 - 10.8 x10E3/uL 7.9 7.6  Hemoglobin 13.0 - 17.7 g/dL 16.1 15.9  Hematocrit 37.5 - 51.0 % 49.2 49.7  Platelets 150 - 450 x10E3/uL 213 -    CMP Latest Ref Rng & Units 01/20/2020 09/12/2019 03/13/2019  Glucose 65 - 99 mg/dL 167(H) 184(H) 338(H)  BUN 8 - 27 mg/dL 22 21 20   Creatinine 0.76 - 1.27 mg/dL 0.99 0.98 0.93  Sodium 134 - 144 mmol/L 134 133(L) 133(L)  Potassium 3.5 - 5.2 mmol/L 4.3 4.7 4.3  Chloride 96 - 106 mmol/L 98 98 97  CO2 20 - 29 mmol/L 22 21 20   Calcium 8.6 - 10.2 mg/dL 10.2 10.3(H) 10.0  Total Protein 6.0 - 8.5 g/dL 6.5 6.5 6.3  Total Bilirubin 0.0 - 1.2 mg/dL 0.3 0.2 0.4  Alkaline Phos 44 - 121 IU/L 71 77 76  AST 0 - 40 IU/L 28 20 21   ALT 0 - 44 IU/L 35 35 31     Past Medical History:  Diagnosis Date  .  Cataract   . Diabetes mellitus without complication ([place])   . Hyperlipidemia   . Hypertension    Past Surgical History:  Procedure Laterality Date  . HERNIA REPAIR    . NECK SURGERY    . SPINE SURGERY    . TRANSURETHRAL RESECTION OF PROSTATE    . TURP VAPORIZATION     Social History: Married.  He is a Corporate treasurer.  He has one daughter age 46. Past smoker, quit 35 years ago. No alcohol. No drug use.   Family History: Father Died age 62  with history of DM I, heart disease and hyperlipidemia. Mother died age 57 with history kidney and lung cancer.  Three brothers with DM, depression. One brother also had kidney cancer.   No Known Allergies    Outpatient Encounter Medications as of 03/16/2020  Medication Sig  . alfuzosin (UROXATRAL) 10 MG 24 hr tablet Take 1 tablet by mouth once daily with breakfast  . ARIPiprazole (ABILIFY) 2 MG tablet Take 2 mg by mouth daily.  Marland Kitchen atorvastatin (LIPITOR) 20 MG tablet TAKE 1 TABLET BY MOUTH ONCE DAILY AT  6  PM  . citalopram (CELEXA) 40 MG tablet Take 40 mg by mouth daily.  . clonazePAM (KLONOPIN) 0.5 MG tablet  Take 0.5 mg by mouth 3 (three) times daily as needed.  Marland Kitchen escitalopram (LEXAPRO) 10 MG tablet Take 10 mg by mouth daily.  Marland Kitchen gabapentin (NEURONTIN) 300 MG capsule Take 1 capsule (300 mg total) by mouth 2 (two) times daily.  Marland Kitchen glipiZIDE (GLUCOTROL) 5 MG tablet Take 3 tablets (15 mg total) by mouth 2 (two) times daily before a meal.  . glucose blood test strip 1 each by Other route daily. Use as instructed  . hydrocortisone 2.5 % ointment APPLY TO AFFECTED AREA EVERYDAY AT BEDTIME  . ibuprofen (ADVIL,MOTRIN) 800 MG tablet Take 1 tablet (800 mg total) by mouth every 8 (eight) hours as needed for pain.  Marland Kitchen JARDIANCE 25 MG TABS tablet TAKE 1 TABLET BY MOUTH ONCE DAILY BEFORE BREAKFAST  . lisinopril-hydrochlorothiazide (ZESTORETIC) 20-25 MG tablet Take 1 tablet by mouth once daily  . metFORMIN (GLUCOPHAGE) 500 MG tablet TAKE 1 TABLET BY MOUTH THREE  TIMES DAILY  . Multiple Vitamin (MULTIVITAMIN) tablet Take 1 tablet by mouth daily.  Marland Kitchen MYRBETRIQ 50 MG TB24 tablet Take 50 mg by mouth daily.   No facility-administered encounter medications on file as of 03/16/2020.     REVIEW OF SYSTEMS:  Gen: Denies fever, sweats or chills. No weight loss.  CV: Denies chest pain, palpitations or edema. Resp: Denies cough, shortness of breath of hemoptysis.  GI: See HPI GU : History of interstitial cystitis with associated urinary symptoms. MS: Denies joint pain, muscles aches or weakness. Derm: Denies rash, itchiness, skin lesions or unhealing ulcers. Psych: + Anxiety and depression. Heme: Denies bruising, bleeding. Neuro:  Denies headaches, dizziness or paresthesias. Endo:  + DM II.   PHYSICAL EXAM: BP 114/70   Pulse 96   Ht 5\' 11"  (1.803 m)   Wt 191 lb (86.6 kg)   BMI 26.64 kg/m   General: Well developed 69 year old male in no acute distress. Head: Normocephalic and atraumatic. Eyes:  Sclerae non-icteric, conjunctive pink. Ears: Normal auditory acuity. Mouth: Dentition intact. No ulcers or lesions.  Neck: Supple, no lymphadenopathy or thyromegaly.  Lungs: Clear bilaterally to auscultation without wheezes, crackles or rhonchi. Heart: Regular rate and rhythm. No murmur, rub or gallop appreciated.  Abdomen: Soft, nontender, non distended. No masses. No hepatosplenomegaly. Normoactive bowel sounds x 4 quadrants.  Rectal: Deferred.  Musculoskeletal: Symmetrical with no gross deformities. Skin: Warm and dry. No rash or lesions on visible extremities. Extremities: No edema. Neurological: Alert oriented x 4, no focal deficits.  Psychological:  Alert and cooperative. Normal mood and affect.  ASSESSMENT AND PLAN:  79. 69 year old male with a positive Cologuard test  -Colonoscopy benefits and risks discussed including risk with sedation, risk of bleeding, perforation and infection  -Further follow up to be determined after colonoscopy  completed   2.  Chronic intermittent RLQ abdominal pain, no current RLQ pain -Patient advised to call our office if his right lower quadrant abdominal pain recurs -Discussed scheduling an abdominal/pelvic CT scan if right lower quadrant abdominal pain recurs  3.  Diabetes Mellitus type 2        CC:  [name], MD

## 2020-03-16 NOTE — Patient Instructions (Addendum)
If you are age 70 or older, your body mass index should be between 23-30. Your Body mass index is 26.64 kg/m. If this is out of the aforementioned range listed, please consider follow up with your Primary Care Provider.  If you are age 20 or younger, your body mass index should be between 19-25. Your Body mass index is 26.64 kg/m. If this is out of the aformentioned range listed, please consider follow up with your Primary Care Provider.   You have been scheduled for a colonoscopy. Please follow written instructions given to you at your visit today.  Please pick up your prep supplies at the pharmacy within the next 1-3 days. If you use inhalers (even only as needed), please bring them with you on the day of your procedure.  Due to recent changes in healthcare laws, you may see the results of your imaging and laboratory studies on MyChart before your provider has had a chance to review them.  We understand that in some cases there may be results that are confusing or concerning to you. Not all laboratory results come back in the same time frame and the provider may be waiting for multiple results in order to interpret others.  Please give [place] 48 hours in order for your provider to thoroughly review all the results before contacting the office for clarification of your results.   Further follow up to be determined after colonoscopy complete.

## 2020-03-30 ENCOUNTER — Other Ambulatory Visit: Payer: Self-pay

## 2020-03-30 ENCOUNTER — Ambulatory Visit (AMBULATORY_SURGERY_CENTER): Payer: Medicare Other | Admitting: Gastroenterology

## 2020-03-30 ENCOUNTER — Encounter: Payer: Self-pay | Admitting: Gastroenterology

## 2020-03-30 VITALS — BP 101/65 | HR 85 | Temp 98.6°F | Resp 11 | Ht 71.0 in | Wt 195.0 lb

## 2020-03-30 DIAGNOSIS — G8929 Other chronic pain: Secondary | ICD-10-CM

## 2020-03-30 DIAGNOSIS — R1031 Right lower quadrant pain: Secondary | ICD-10-CM

## 2020-03-30 DIAGNOSIS — K648 Other hemorrhoids: Secondary | ICD-10-CM

## 2020-03-30 DIAGNOSIS — R195 Other fecal abnormalities: Secondary | ICD-10-CM

## 2020-03-30 DIAGNOSIS — D123 Benign neoplasm of transverse colon: Secondary | ICD-10-CM | POA: Diagnosis not present

## 2020-03-30 DIAGNOSIS — D124 Benign neoplasm of descending colon: Secondary | ICD-10-CM

## 2020-03-30 DIAGNOSIS — D122 Benign neoplasm of ascending colon: Secondary | ICD-10-CM | POA: Diagnosis not present

## 2020-03-30 DIAGNOSIS — K5730 Diverticulosis of large intestine without perforation or abscess without bleeding: Secondary | ICD-10-CM

## 2020-03-30 DIAGNOSIS — I10 Essential (primary) hypertension: Secondary | ICD-10-CM | POA: Diagnosis not present

## 2020-03-30 DIAGNOSIS — E119 Type 2 diabetes mellitus without complications: Secondary | ICD-10-CM | POA: Diagnosis not present

## 2020-03-30 MED ORDER — SODIUM CHLORIDE 0.9 % IV SOLN: 500.0000 mL | Freq: Once | INTRAVENOUS | Status: DC

## 2020-03-30 NOTE — Progress Notes (Signed)
Called to room to assist during endoscopic procedure.  Patient ID and intended procedure confirmed with present staff. Received instructions for my participation in the procedure from the performing physician.  

## 2020-03-30 NOTE — Patient Instructions (Signed)
Information on polyps, diverticulosis and hemorrhoids given to you today.  Await pathology results.  Resume previous diet and medications.  Eat a high fiber diet and drink at least 64 ounces of water per day.  Rosanne Sack will contact you to set up a CT scan of the abdomen and pelvis to further evaluate your abdominal pain.  YOU HAD AN ENDOSCOPIC PROCEDURE TODAY AT [facility]:   Refer to the procedure report that was given to you for any specific questions about what was found during the examination.  If the procedure report does not answer your questions, please call your gastroenterologist to clarify.  If you requested that your care partner not be given the details of your procedure findings, then the procedure report has been included in a sealed envelope for you to review at your convenience later.  YOU SHOULD EXPECT: Some feelings of bloating in the abdomen. Passage of more gas than usual.  Walking can help get rid of the air that was put into your GI tract during the procedure and reduce the bloating. If you had a lower endoscopy (such as a colonoscopy or flexible sigmoidoscopy) you may notice spotting of blood in your stool or on the toilet paper. If you underwent a bowel prep for your procedure, you may not have a normal bowel movement for a few days.  Please Note:  You might notice some irritation and congestion in your nose or some drainage.  This is from the oxygen used during your procedure.  There is no need for concern and it should clear up in a day or so.  SYMPTOMS TO REPORT IMMEDIATELY:   Following lower endoscopy (colonoscopy or flexible sigmoidoscopy):  Excessive amounts of blood in the stool  Significant tenderness or worsening of abdominal pains  Swelling of the abdomen that is new, acute  Fever of 100F or higher  For urgent or emergent issues, a gastroenterologist can be reached at any hour by calling [phone]. Do not use MyChart messaging for  urgent concerns.    DIET:  We do recommend a small meal at first, but then you may proceed to your regular diet.  Drink plenty of fluids but you should avoid alcoholic beverages for 24 hours.  ACTIVITY:  You should plan to take it easy for the rest of today and you should NOT DRIVE or use heavy machinery until tomorrow (because of the sedation medicines used during the test).    FOLLOW UP: Our staff will call the number listed on your records 48-72 hours following your procedure to check on you and address any questions or concerns that you may have regarding the information given to you following your procedure. If we do not reach you, we will leave a message.  We will attempt to reach you two times.  During this call, we will ask if you have developed any symptoms of COVID 19. If you develop any symptoms (ie: fever, flu-like symptoms, shortness of breath, cough etc.) before then, please call [phone].  If you test positive for Covid 19 in the 2 weeks post procedure, please call and report this information to [place].    If any biopsies were taken you will be contacted by phone or by letter within the next 1-3 weeks.  Please call us at [phone] if you have not heard about the biopsies in 3 weeks.    SIGNATURES/CONFIDENTIALITY: You and/or your care partner have signed paperwork which will be entered into your electronic  medical record.  These signatures attest to the fact that that the information above on your After Visit Summary has been reviewed and is understood.  Full responsibility of the confidentiality of this discharge information lies with you and/or your care-partner.

## 2020-03-30 NOTE — Progress Notes (Signed)
Report to PACU, RN, vss, BBS= Clear.  

## 2020-03-30 NOTE — Op Note (Signed)
[facility] Patient Name: Andrew Tanner Procedure Date: 03/30/2020 10:18 AM MRN: [id number] Endoscopist: Thornton Park MD, MD Age: 69 Referring MD:  Date of Birth: 24-Oct-1950 Gender: Male Account #: [id number] Procedure:                Colonoscopy Indications:              Abdominal pain in the right lower quadrant,                            Positive Cologuard test Medicines:                Monitored Anesthesia Care Procedure:                Pre-Anesthesia Assessment:                           - Prior to the procedure, a History and Physical                            was performed, and patient medications and                            allergies were reviewed. The patient's tolerance of                            previous anesthesia was also reviewed. The risks                            and benefits of the procedure and the sedation                            options and risks were discussed with the patient.                            All questions were answered, and informed consent                            was obtained. Prior Anticoagulants: The patient has                            taken no previous anticoagulant or antiplatelet                            agents. ASA Grade Assessment: II - A patient with                            mild systemic disease. After reviewing the risks                            and benefits, the patient was deemed in                            satisfactory condition to undergo the procedure.  After obtaining informed consent, the colonoscope                            was passed under direct vision. Throughout the                            procedure, the patient's blood pressure, pulse, and                            oxygen saturations were monitored continuously. The                            Olympus [id number]) [id number] was introduced                            through the anus and advanced to  the 10 cm into the                            ileum. The colonoscopy was performed without                            difficulty. The patient tolerated the procedure                            well. The quality of the bowel preparation was                            good. The terminal ileum, ileocecal valve,                            appendiceal orifice, and rectum were photographed. Scope In: 10:27:48 AM Scope Out: 10:42:55 AM Scope Withdrawal Time: 0 hours 12 minutes 27 seconds  Total Procedure Duration: 0 hours 15 minutes 7 seconds  Findings:                 The perianal and digital rectal examinations were                            normal.                           A 2 mm polyp was found in the descending colon. The                            polyp was flat. The polyp was removed with a cold                            snare. Resection and retrieval were complete.                            Estimated blood loss was minimal.                           A 4 mm polyp was found in the distal  transverse                            colon. The polyp was semi-pedunculated. The polyp                            was removed with a cold snare. Resection and                            retrieval were complete. Estimated blood loss was                            minimal.                           A 5 mm polyp was found in the ascending colon. The                            polyp was semi-pedunculated. The polyp was removed                            with a cold snare. Resection and retrieval were                            complete. Estimated blood loss was minimal.                           Multiple small and large-mouthed diverticula were                            found in the sigmoid colon, descending colon and                            ascending colon.                           Non-bleeding internal hemorrhoids were found.                           Approximately 10 cm of terminal ileum was  examined                            and found to be normal. The exam was otherwise                            without abnormality on direct and retroflexion                            views. Complications:            No immediate complications. Estimated blood loss:                            Minimal. Estimated Blood Loss:     Estimated blood loss was minimal. Impression:               -  One 2 mm polyp in the descending colon, removed                            with a cold snare. Resected and retrieved.                           - One 4 mm polyp in the distal transverse colon,                            removed with a cold snare. Resected and retrieved.                           - One 5 mm polyp in the ascending colon, removed                            with a cold snare. Resected and retrieved.                           - Diverticulosis in the sigmoid colon, in the                            descending colon and in the ascending colon.                           - Non-bleeding internal hemorrhoids.                           - The polyps are the likely source of the +                            Cologuard                           - Sournce of abdominal pain not identified on this                            study Recommendation:           - Patient has a contact number available for                            emergencies. The signs and symptoms of potential                            delayed complications were discussed with the                            patient. Return to normal activities tomorrow.                            Written discharge instructions were provided to the                            patient.                           -  Follow a high fiber diet. Drink at least 64                            ounces of water daily. Add a daily stool bulking                            agent such as psyllium (an exampled would be                            Metamucil).                            - Continue present medications.                           - Await pathology results.                           - Repeat colonoscopy date to be determined after                            pending pathology results are reviewed for                            surveillance.                           - Emerging evidence supports eating a diet of                            fruits, vegetables, grains, calcium, and yogurt                            while reducing red meat and alcohol may reduce the                            risk of colon cancer.                           - Thank you for allowing me to be involved in your                            colon cancer prevention.                           Vaughan Basta, RN will contact you to arrange for a CT                            abd/pelvis to further evaluate your abdominal pain.                           - Will plan office follow-up after the CT scan. Thornton Park MD, MD 03/30/2020 10:52:32 AM This report has been signed electronically.

## 2020-03-30 NOTE — Progress Notes (Signed)
VS- Andrew Tanner 

## 2020-03-31 ENCOUNTER — Telehealth: Payer: Self-pay

## 2020-03-31 ENCOUNTER — Other Ambulatory Visit: Payer: Self-pay

## 2020-03-31 DIAGNOSIS — G8929 Other chronic pain: Secondary | ICD-10-CM

## 2020-03-31 NOTE — Telephone Encounter (Signed)
-----   Message from Thornton Park, MD sent at 03/30/2020 10:46 AM EST ----- Patient needs CT abd/pelvis with contrast to evaluate RLQ pain not explained by colonoscopy. Thank you!

## 2020-03-31 NOTE — Telephone Encounter (Signed)
Pt scheduled for CT of A/P at [facility] 04/13/20 at 8:30am. Pt to arrive there at 8:15am. Pt to be NPO after midnight except for bottle 1 of contrast at 6:30am and bottle 2 at 7:30am. Left message for pt to  Call back.

## 2020-04-02 ENCOUNTER — Telehealth: Payer: Self-pay

## 2020-04-02 NOTE — Telephone Encounter (Signed)
  Follow up Call-  Call back number 03/30/2020  Post procedure Call Back phone  # [phone]  Permission to leave phone message Yes  Some recent data might be hidden     Patient questions:  Do you have a fever, pain , or abdominal swelling? No. Pain Score  0 *  Have you tolerated food without any problems? Yes.    Have you been able to return to your normal activities? Yes.    Do you have any questions about your discharge instructions: Diet   No. Medications  No. Follow up visit  No.  Do you have questions or concerns about your Care? No.  Actions: * If pain score is 4 or above: No action needed, pain <4. 1. Have you developed a fever since your procedure? no  2.   Have you had an respiratory symptoms (SOB or cough) since your procedure? no  3.   Have you tested positive for COVID 19 since your procedure no  4.   Have you had any family members/close contacts diagnosed with the COVID 19 since your procedure?  no   If yes to any of these questions please route to Joylene John, RN and Joella Prince, RN

## 2020-04-02 NOTE — Telephone Encounter (Signed)
Spoke with pt and he is aware of appt 

## 2020-04-02 NOTE — Telephone Encounter (Signed)
Left message for pt to call back  °

## 2020-04-07 ENCOUNTER — Other Ambulatory Visit: Payer: Self-pay | Admitting: Family Medicine

## 2020-04-09 ENCOUNTER — Encounter: Payer: Self-pay | Admitting: Gastroenterology

## 2020-04-13 ENCOUNTER — Ambulatory Visit (HOSPITAL_COMMUNITY)
Admission: RE | Admit: 2020-04-13 | Discharge: 2020-04-13 | Disposition: A | Discharge status: Home / Self Care | Payer: Medicare Other | Source: Ambulatory Visit | Attending: Gastroenterology | Admitting: Gastroenterology

## 2020-04-13 ENCOUNTER — Encounter (HOSPITAL_COMMUNITY): Payer: Self-pay

## 2020-04-13 ENCOUNTER — Other Ambulatory Visit: Payer: Self-pay

## 2020-04-13 DIAGNOSIS — G8929 Other chronic pain: Secondary | ICD-10-CM | POA: Diagnosis not present

## 2020-04-13 DIAGNOSIS — R109 Unspecified abdominal pain: Secondary | ICD-10-CM | POA: Diagnosis not present

## 2020-04-13 DIAGNOSIS — R1031 Right lower quadrant pain: Secondary | ICD-10-CM | POA: Insufficient documentation

## 2020-04-13 LAB — POCT I-STAT CREATININE: Creatinine, Ser: 0.9 mg/dL (ref 0.61–1.24)

## 2020-04-13 MED ORDER — IOHEXOL 300 MG/ML  SOLN
100.0000 mL | Freq: Once | INTRAMUSCULAR | Status: AC | PRN
  Administered 2020-04-13: 100 mL via INTRAVENOUS

## 2020-04-14 DIAGNOSIS — R102 Pelvic and perineal pain: Secondary | ICD-10-CM | POA: Diagnosis not present

## 2020-04-14 DIAGNOSIS — N3010 Interstitial cystitis (chronic) without hematuria: Secondary | ICD-10-CM | POA: Diagnosis not present

## 2020-04-14 DIAGNOSIS — M62838 Other muscle spasm: Secondary | ICD-10-CM | POA: Diagnosis not present

## 2020-04-21 DIAGNOSIS — F3341 Major depressive disorder, recurrent, in partial remission: Secondary | ICD-10-CM | POA: Diagnosis not present

## 2020-05-01 ENCOUNTER — Other Ambulatory Visit: Payer: Self-pay | Admitting: Family Medicine

## 2020-05-01 DIAGNOSIS — E1165 Type 2 diabetes mellitus with hyperglycemia: Secondary | ICD-10-CM

## 2020-05-13 DIAGNOSIS — M62838 Other muscle spasm: Secondary | ICD-10-CM | POA: Diagnosis not present

## 2020-05-13 DIAGNOSIS — N3010 Interstitial cystitis (chronic) without hematuria: Secondary | ICD-10-CM | POA: Diagnosis not present

## 2020-05-13 DIAGNOSIS — R102 Pelvic and perineal pain: Secondary | ICD-10-CM | POA: Diagnosis not present

## 2020-05-13 NOTE — Progress Notes (Signed)
+    05/13/2020 Andrew Tanner [id number] 20-Dec-1950   Chief Complaint:  RLQ pain   History of Present Illness: Andrew Tanner is a 70 year old male with a past medical history of Andrew Tanner is a 70 year old male with a past medical history of depression, hypertension, hyperlipidemia, DM II and interstitial cystitis. Past right inguinal hernia surgery.  I initially saw him in the office on 03/16/2020 for further evaluation regarding RLQ abdominal pain and to schedule a colonoscopy due to a positive Cologuard test. He underwent a colonoscopy with Dr. Tarri Glenn on 03/30/2020 which identified 3 tubular adenomatous polyps and diverticulosis to the ascending, descending and sigmoid colon. No explanation for his RLQ pain was identified. A repeat colonoscopy was in 3 year was recommended. An abd/pelvic CT scan was done 04/13/2020 which identified multiple pancreatic cysts involving the body and tail, the largest measuring 10 x 12 x 11. Another smaller cyst lies along the superior aspect of the body and there are at least 2 or 3 smaller cysts involving the tail of the pancreas with mild prominence of the pancreatic duct especially at the level of the body and tail with maximum diameter of 4 mm. These are most likely small postinflammatory or benign cysts.Indolent neoplasm, such as intraductal papillary mucinous tumors as noted per the radiologist.  A repeat abdominal MRI with MRCP was recommended in 1 year. He continues to have intermittent RLQ pain, comes and goes. No change after defecation. He is also having lower back pain since falling on the ice a few weeks ago. At that time, he reported landing on his head and tailbone. No concussion. He is seeing a physical therapist regarding the back pain.  He is passing normal BMs. No fever, chills or sweats. He has lost 5lbs over the past 2 months.  No alcohol use. CTAP 04/13/2020 showed an enlarged prostate, he is scheduled to see his urologist in March 2022. No  other complaints at this time.   CTAP with contrast 04/13/2020: 1. No acute findings in the abdomen or pelvis. 2. Normal CT appearance of the appendix. 3. Multiple small incidental pancreatic cysts predominantly involving the body and tail. The largest along the undersurface of the mid body measures approximately 10 x 12 x 11 mm. Another smaller cyst lies along the superior aspect of the body and there are at least 2 or 3 smaller cysts involving the tail of the pancreas. There is some associated mild prominence of the pancreatic duct especially at the level of the body and tail with maximum diameter of 4 mm. These are most likely small postinflammatory or benign cysts. Indolent neoplasm, such as intraductal papillary mucinous tumors could look similar. Recommend follow up pre and post contrast MRI/MRCP in 1 year.  4. Moderate prostatic enlargement. 5. Aortic atherosclerosis without evidence of aneurysm. Aortic Atherosclerosis   Colonoscopy 03/30/2020: - The perianal and digital rectal examinations were normal. - A 2 mm polyp was found in the descending colon. The polyp was flat. The polyp was removed with a cold snare. Resection and retrieval were complete. Estimated blood loss was minimal. - A 4 mm polyp was found in the distal transverse colon. The polyp was semi-pedunculated. The polyp was removed with a cold snare. Resection and retrieval were complete. Estimated blood loss was minimal. - A 5 mm polyp was found in the ascending colon. The polyp was semi-pedunculated. The polyp was removed with a cold snare. Resection and retrieval were complete. Estimated blood loss was minimal. -  Multiple small and large-mouthed diverticula were found in the sigmoid colon, descending colon and ascending colon. - Non-bleeding internal hemorrhoids were found. - Approximately 10 cm of terminal ileum was examined and found to be normal. The exam was otherwise without abnormality on direct and  retroflexion views. -Recall colonoscopy 3 years. Biopsy Report: Surgical [P], colon, transverse, ascending, descending, polyps (3) - TUBULAR ADENOMA (SIX FRAGMENTS). - NO HIGH GRADE DYSPLASIA OR CARCINOMA.  Current Outpatient Medications on File Prior to Visit  Medication Sig Dispense Refill  . alfuzosin (UROXATRAL) 10 MG 24 hr tablet Take 1 tablet by mouth once daily with breakfast 90 tablet 1  . atorvastatin (LIPITOR) 20 MG tablet TAKE 1 TABLET BY MOUTH ONCE DAILY AT  6  PM 90 tablet 3  . clonazePAM (KLONOPIN) 0.5 MG tablet Take 0.5 mg by mouth 3 (three) times daily as needed.    Marland Kitchen escitalopram (LEXAPRO) 10 MG tablet Take 10 mg by mouth daily.    Marland Kitchen gabapentin (NEURONTIN) 300 MG capsule Take 1 capsule (300 mg total) by mouth 2 (two) times daily. 180 capsule 0  . glipiZIDE (GLUCOTROL) 5 MG tablet TAKE 3 TABLETS BY MOUTH TWICE DAILY BEFORE A MEAL 180 tablet 0  . glucose blood test strip 1 each by Other route daily. Use as instructed 100 each 3  . ibuprofen (ADVIL,MOTRIN) 800 MG tablet Take 1 tablet (800 mg total) by mouth every 8 (eight) hours as needed for pain. 30 tablet 0  . JARDIANCE 25 MG TABS tablet TAKE 1 TABLET BY MOUTH ONCE DAILY BEFORE BREAKFAST 90 tablet 0  . lisinopril-hydrochlorothiazide (ZESTORETIC) 20-25 MG tablet Take 1 tablet by mouth once daily 90 tablet 1  . metFORMIN (GLUCOPHAGE) 500 MG tablet TAKE 1 TABLET BY MOUTH THREE TIMES DAILY 270 tablet 0  . Misc Natural Products (PUMPKIN SEED OIL PO) Take by mouth.    Marland Kitchen MYRBETRIQ 50 MG TB24 tablet Take 50 mg by mouth daily.    . Turmeric (QC TUMERIC COMPLEX PO) Take by mouth.    . hydrocortisone 2.5 % ointment APPLY TO AFFECTED AREA EVERYDAY AT BEDTIME (Patient not taking: No sig reported) 29 g 0   No current facility-administered medications on file prior to visit.   No Known Allergies  Current Medications, Allergies, Past Medical History, Past Surgical History, Family History and Social History were reviewed in [employer] record.   Review of Systems:   Constitutional: Negative for fever, sweats, chills or weight loss.  Respiratory: Negative for shortness of breath.   Cardiovascular: Negative for chest pain, palpitations and leg swelling.  Gastrointestinal: See HPI.  Musculoskeletal:+ back pain.  Neurological: Negative for dizziness, headaches or paresthesias.    Physical Exam: BP 130/78   Pulse 95   Ht 5\' 11"  (1.803 m)   Wt 186 lb (84.4 kg)   SpO2 96%   BMI 25.94 kg/m   Wt Readings from Last 3 Encounters:  05/14/20 186 lb (84.4 kg)  03/30/20 195 lb (88.5 kg)  03/16/20 191 lb (86.6 kg)   General: Well developed 70 year old male in no acute distress. Head: Normocephalic and atraumatic. Eyes: No scleral icterus. Conjunctiva pink . Ears: Normal auditory acuity. Mouth: Dentition intact. No ulcers or lesions.  Lungs: Clear throughout to auscultation. Heart: Regular rate and rhythm, no murmur. Abdomen: Soft, nontender and nondistended. No masses or hepatomegaly. Normal bowel sounds x 4 quadrants. RLQ scar, prior RIH surgery.  Rectal: Deferred.  Musculoskeletal: Symmetrical with no gross deformities. Extremities: No edema.  Neurological: Alert oriented x 4. No focal deficits.  Psychological: Alert and cooperative. Normal mood and affect  Assessment and Recommendations: 1. RLQ pain, unclear etiology. Colonoscopy 03/30/2020 and CTAP 04/2020 findings did not identify etiology for his RLQ pain. Possibly due to adhesions from remote right inguinal hernia surgery.   2. CTAP 04/13/2020 identified multiple small incidental pancreatic cysts to the body and tail with mild prominence of the pancreatic duct. Refer to CTAP results above.  -Abdominal MRI/MRCP timing to be discussed further with Dr. Tarri Glenn   3. Three tubular adenomatous polyps removed from the colon per colonoscopy 03/2020.  -Next colonoscopy due 12/202024  4. DM on Metformin, Jardiance and Glipizide

## 2020-05-14 ENCOUNTER — Encounter: Payer: Self-pay | Admitting: Nurse Practitioner

## 2020-05-14 ENCOUNTER — Other Ambulatory Visit: Payer: Self-pay

## 2020-05-14 ENCOUNTER — Ambulatory Visit: Payer: Medicare Other | Admitting: Nurse Practitioner

## 2020-05-14 VITALS — BP 130/78 | HR 95 | Ht 71.0 in | Wt 186.0 lb

## 2020-05-14 DIAGNOSIS — K862 Cyst of pancreas: Secondary | ICD-10-CM

## 2020-05-14 DIAGNOSIS — R1031 Right lower quadrant pain: Secondary | ICD-10-CM

## 2020-05-14 DIAGNOSIS — G8929 Other chronic pain: Secondary | ICD-10-CM | POA: Diagnosis not present

## 2020-05-14 NOTE — Patient Instructions (Signed)
If you are age 71 or older, your body mass index should be between 23-30. Your Body mass index is 25.94 kg/m. If this is out of the aforementioned range listed, please consider follow up with your Primary Care Provider.  OVER THE COUNTER MEDICATION  Please purchase the following medications over the counter and take as directed:  IBgard Capsule, take 1 capsule twice a day for abdominal pain/gas and bloating. We have given you some samples to try.  You are due for a repeat colonoscopy December 2023.  Please call our office if your symptoms worsen.  It was great seeing you today!  Thank you for entrusting me with your care and choosing [facility].  Noralyn Pick, CRNP

## 2020-05-18 ENCOUNTER — Ambulatory Visit (INDEPENDENT_AMBULATORY_CARE_PROVIDER_SITE_OTHER): Payer: Medicare Other | Admitting: Family Medicine

## 2020-05-18 ENCOUNTER — Telehealth: Payer: Self-pay | Admitting: Nurse Practitioner

## 2020-05-18 ENCOUNTER — Encounter: Payer: Self-pay | Admitting: Family Medicine

## 2020-05-18 ENCOUNTER — Other Ambulatory Visit: Payer: Self-pay

## 2020-05-18 ENCOUNTER — Other Ambulatory Visit: Payer: Self-pay | Admitting: Nurse Practitioner

## 2020-05-18 VITALS — BP 122/74 | HR 102 | Temp 98.2°F | Ht 71.0 in | Wt 183.0 lb

## 2020-05-18 DIAGNOSIS — E1165 Type 2 diabetes mellitus with hyperglycemia: Secondary | ICD-10-CM | POA: Diagnosis not present

## 2020-05-18 DIAGNOSIS — M79601 Pain in right arm: Secondary | ICD-10-CM

## 2020-05-18 DIAGNOSIS — K862 Cyst of pancreas: Secondary | ICD-10-CM

## 2020-05-18 DIAGNOSIS — G8929 Other chronic pain: Secondary | ICD-10-CM

## 2020-05-18 MED ORDER — GLIPIZIDE 10 MG PO TABS: 20.0000 mg | ORAL_TABLET | Freq: Two times a day (BID) | ORAL | 2 refills | Status: DC

## 2020-05-18 MED ORDER — GLUCOSE BLOOD VI STRP: 1.0000 | ORAL_STRIP | Freq: Every day | 8 refills | Status: DC

## 2020-05-18 NOTE — Progress Notes (Signed)
Subjective:  Patient ID: Andrew Tanner, male    DOB: [birth year]  Age: 70 y.o. MRN: [id number]  CC:  Chief Complaint  Patient presents with  . Follow-up    On diabetes and check A1C.Pt reports he wanted to discuss a recent CT scan with the provider today. It was done on 04/13/2020 and is in the pt's chart. Pt reports no known change with his BS since last OV. Pt states his A1C should be the same or less since last OV due to weight loss. Pt want the provider to know he had IC since last OV  . Arm Pain    Pt reports pain in his R bicep. Pt reports the pain shows up at bed time and go's away when he gets up in the morning. Pt reports the pain started a couple of months ago.    HPI Andrew Tanner presents for multiple concerns as above:  Abnormal CT scan Has been followed recently by gastroenterology.  CT on January 3 indicated multiple small incidental pancreatic cysts.  Mild prominence of pancreatic duct.  Most likely small postinflammatory benign cysts.  Indolent neoplasm could look similar, recommended pre and postcontrast MRI/MRCP in 1 year.  Aortic atherosclerosis noted without evidence of aneurysm.  No acute findings in the abdomen or pelvis.  It appears that after further review they had planned on checking the MRI/MRCP now instead of waiting for 1 year. He did talk to Ms. Berniece Pap today.  He is scared about what this could mean and more acute timing of area. In talking about this further,  he understands concern and would like to have MRI sooner, as this may lessen his worry as well.    Right arm pain Notes at bedtime only in middle of the night. improves when he gets up in the morning. No pain during the day  Noticed for the past few months. NKI. Varied sleep position - sometimes arms tucked in when sore and wakes him up.  Tx: none  R hand dominant.   Diabetes: With hyperglycemia, last eval in October.  Glipizide increased to 15mg  twice daily at that time.  Continued  lifestyle modifications discussed. Did not tolerate metformin higher than 1500mg  per day.  Has lost weight for diet with interstitial cystitis.  Takes glipizide 70m bid, Jardiance 25mg  qd, Metformin 500mg  TID.  He is on statin and ACE inhibitor.  Normal microalbumin/creatinine ratio in September 2020 Home readings: Fasting: 160's- 180 2hr PP: 160-240.  No symptomatic lows - lowest 110   Wt Readings from Last 3 Encounters:  05/18/20 183 lb (83 kg)  05/14/20 186 lb (84.4 kg)  03/30/20 195 lb (88.5 kg)   Lab Results  Component Value Date   HGBA1C 7.8 (H) 01/20/2020   HGBA1C 7.5 (H) 09/12/2019   HGBA1C 7.9 (H) 06/12/2019   Lab Results  Component Value Date   MICROALBUR 3.8 (H) 03/10/2014   LDLCALC 107 (H) 01/20/2020   CREATININE 0.90 04/13/2020     History Patient Active Problem List   Diagnosis Date Noted  . Positive colorectal cancer screening using Cologuard test 03/16/2020  . Chronic RLQ pain 03/16/2020  . Diabetes mellitus type II 06/02/2011  . Hypertension 06/02/2011  . Hyperlipemia 06/02/2011  . Cataract 06/02/2011   Past Medical History:  Diagnosis Date  . Anxiety   . Cancer ([place])    basal cell skin CA  . Cataract   . Depression   . Diabetes mellitus without complication ([place])   . Hyperlipidemia   .  Hypertension   . IC (interstitial cystitis)    Past Surgical History:  Procedure Laterality Date  . CATARACT EXTRACTION, BILATERAL    . HERNIA REPAIR    . NECK SURGERY    . SPINE SURGERY    . TRANSURETHRAL RESECTION OF PROSTATE    . TURP VAPORIZATION     No Known Allergies Prior to Admission medications   Medication Sig Start Date End Date Taking? Authorizing Provider  alfuzosin (UROXATRAL) 10 MG 24 hr tablet Take 1 tablet by mouth once daily with breakfast 12/23/19  Yes [name], MD  atorvastatin (LIPITOR) 20 MG tablet TAKE 1 TABLET BY MOUTH ONCE DAILY AT  6  PM 02/10/20  Yes [name], MD  clonazePAM (KLONOPIN) 0.5 MG tablet Take 0.5 mg  by mouth 3 (three) times daily as needed.   Yes [provider]  escitalopram (LEXAPRO) 10 MG tablet Take 10 mg by mouth daily. 01/22/20  Yes [provider]  gabapentin (NEURONTIN) 300 MG capsule Take 1 capsule (300 mg total) by mouth 2 (two) times daily. 12/31/18  Yes [name], MD  glipiZIDE (GLUCOTROL) 5 MG tablet TAKE 3 TABLETS BY MOUTH TWICE DAILY BEFORE A MEAL 05/01/20  Yes Just, Laurita Quint, FNP  glucose blood test strip 1 each by Other route daily. Use as instructed 07/26/17  Yes [name], MD  hydrocortisone 2.5 % ointment APPLY TO AFFECTED AREA EVERYDAY AT BEDTIME 08/19/19  Yes Lavonna Monarch, MD  ibuprofen (ADVIL,MOTRIN) 800 MG tablet Take 1 tablet (800 mg total) by mouth every 8 (eight) hours as needed for pain. 01/09/12  Yes Shawnee Knapp, MD  JARDIANCE 25 MG TABS tablet TAKE 1 TABLET BY MOUTH ONCE DAILY BEFORE BREAKFAST 02/10/20  Yes [name], MD  lisinopril-hydrochlorothiazide (ZESTORETIC) 20-25 MG tablet Take 1 tablet by mouth once daily 02/10/20  Yes [name], MD  metFORMIN (GLUCOPHAGE) 500 MG tablet TAKE 1 TABLET BY MOUTH THREE TIMES DAILY 04/07/20  Yes [name], MD  Misc Natural Products (PUMPKIN SEED OIL PO) Take by mouth.   Yes [provider]  MYRBETRIQ 50 MG TB24 tablet Take 50 mg by mouth daily. 01/16/20  Yes [provider]  Turmeric (QC TUMERIC COMPLEX PO) Take by mouth.   Yes [provider]   Social History   Socioeconomic History  . Marital status: Married    Spouse name: [name]  . Number of children: Not on file  . Years of education: Not on file  . Highest education level: Not on file  Occupational History  . Not on file  Tobacco Use  . Smoking status: Former Research scientist (life sciences)  . Smokeless tobacco: Never Used  . Tobacco comment: quit 28 yrs ago  Vaping Use  . Vaping Use: Never used  Substance and Sexual Activity  . Alcohol use: Yes    Comment: 1 glass wine per week  . Drug use: No   . Sexual activity: Never  Other Topics Concern  . Not on file  Social History Narrative  . Not on file   Social Determinants of Health   Financial Resource Strain: Not on file  Food Insecurity: Not on file  Transportation Needs: Not on file  Physical Activity: Not on file  Stress: Not on file  Social Connections: Not on file  Intimate Partner Violence: Not on file    Review of Systems  Constitutional: Negative for fatigue and unexpected weight change (No unexplained weight change).  Eyes: Negative  for visual disturbance.  Respiratory: Negative for cough, chest tightness and shortness of breath.   Cardiovascular: Negative for chest pain, palpitations and leg swelling.  Gastrointestinal: Negative for abdominal pain, blood in stool, nausea and vomiting.  Neurological: Negative for dizziness, light-headedness and headaches.     Objective:   Vitals:   05/18/20 1426  BP: 122/74  Pulse: (!) 102  Temp: 98.2 F (36.8 C)  TempSrc: Temporal  SpO2: 97%  Weight: 183 lb (83 kg)  Height: 5\' 11"  (1.803 m)     Physical Exam Vitals reviewed.  Constitutional:      Appearance: He is well-developed and well-nourished.  HENT:     Head: Normocephalic and atraumatic.  Eyes:     Extraocular Movements: EOM normal.     Pupils: Pupils are equal, round, and reactive to light.  Neck:     Vascular: No carotid bruit or JVD.  Cardiovascular:     Rate and Rhythm: Normal rate and regular rhythm.     Heart sounds: Normal heart sounds. No murmur heard.   Pulmonary:     Effort: Pulmonary effort is normal.     Breath sounds: Normal breath sounds. No rales.  Abdominal:     General: There is no distension.     Tenderness: There is no abdominal tenderness.  Musculoskeletal:        General: No edema.     Comments: Right arm full range of motion of shoulder, full range of motion of elbow.  Skin intact, no erythema, no rash.  No bony tenderness.  Negative speeds, negative Yergason.  Pain-free  supination and resisted biceps testing.  Proximal biceps nontender.  Notes area of discomfort typically at distal aspect of biceps, no defect or tenderness appreciated at area of involvement.  Skin:    General: Skin is warm and dry.  Neurological:     Mental Status: He is alert and oriented to person, place, and time.  Psychiatric:        Mood and Affect: Mood and affect normal.       32 minutes spent during visit, greater than 50% counseling and assimilation of information, chart review, and discussion of plan.   Assessment & Plan:  Andrew Tanner is a 70 y.o. male . Type 2 diabetes mellitus with hyperglycemia, without long-term current use of insulin (HCC) - Plan: Hemoglobin A1c, glipiZIDE (GLUCOTROL) 10 MG tablet, glucose blood test strip  -Decreased control.  Would consider GLP-1, but would like to wait and see what imaging evaluation of pancreatic cyst entails.  Can try increasing glipizide to 20 mg twice daily for now with hypoglycemia precautions given.  Continue same dose Metformin.  Check A1c.  91-month follow-up.  Cyst of pancreas  -Plan for MRI as above with cysts and pancreatic duct prominence., continue follow-up with gastroenterology.  Right arm pain -Noted only with sleep, suspect positional component.  Reassuring exam recommended journal of symptoms and sleep position when he does experience symptoms.  Additionally can try loose towel wrap to minimize elbow flexion or tucking arm underneath body.  RTC precautions  Meds ordered this encounter  Medications  . glipiZIDE (GLUCOTROL) 10 MG tablet    Sig: Take 2 tablets (20 mg total) by mouth 2 (two) times daily before a meal.    Dispense:  60 tablet    Refill:  2  . glucose blood test strip    Sig: 1 each by Other route daily. Use as instructed checking once per day, or if new symptoms.  Dispense:  50 each    Refill:  8    Please dispense One Touch Ultra test strips to patient.   Patient Instructions    Increase  glipizide to 20mg  twice per day for now. We can consider a GLP1 medicine, but I would like to see results from MRI/pancreas test first. No change in other meds for now. If any low blood sugars,return to prior dose.   Arm exam is reassuring today.  Try keeping a journal of how you sleep with your arm and when you wake up in pain to see if there is one certain position that makes it worse. You can also try wrapping a loose towel around the elbow to keep you from bending your arm. If not improving in the next 4 weeks or so let me know and we can look at other possibilities.   Return to the clinic or go to the nearest emergency room if any of your symptoms worsen or new symptoms occur.    If you have lab work done today you will be contacted with your lab results within the next 2 weeks.  If you have not heard from [place] then please contact us. The fastest way to get your results is to register for My Chart.   IF you received an x-ray today, you will receive an invoice from [facility]. Please contact [facility] at [phone] with questions or concerns regarding your invoice.   IF you received labwork today, you will receive an invoice from [facility]. Please contact [facility] at [phone] with questions or concerns regarding your invoice.   Our billing staff will not be able to assist you with questions regarding bills from these companies.  You will be contacted with the lab results as soon as they are available. The fastest way to get your results is to activate your My Chart account. Instructions are located on the last page of this paperwork. If you have not heard from [place] regarding the results in 2 weeks, please contact this office.         Signed, [name], MD [facility]

## 2020-05-18 NOTE — Progress Notes (Signed)
Reviewed and agree with management plans. Would proceed with MRI/MRCP now given his unexplained symptoms.   Michiko Lineman L. Tarri Glenn, MD, MPH

## 2020-05-18 NOTE — Telephone Encounter (Signed)
Spoke to patient to inform him that he is scheduled for MRCP on 06/03/20 9 am at [facility]. All questions answered. Patient voiced understanding.

## 2020-05-18 NOTE — Telephone Encounter (Signed)
Andrew Tanner, I called the patient and informed him due to his abnormal CTAP 04/13/2020 which identified multiple small incidental pancreatic cysts to the body and tail with mild prominence of the pancreatic duct and abdominal MRI with MRCP with and without contrast was recommended for further evaluation as verified by Dr. Tarri Glenn. To further clarify as prior CTAP notes indicated abdominal MRI in 1 year but after further review, we decided to proceed with the abd MRI/MRCP now. Further follow up to be determined after results received. Also, patient has a titanium rod in his neck, pls enter that info on the comment section and the radiologist will need to verify if patient is ok to proceed with MRI/MRCP thanks.

## 2020-05-18 NOTE — Patient Instructions (Addendum)
  Increase glipizide to 20mg  twice per day for now. We can consider a GLP1 medicine, but I would like to see results from MRI/pancreas test first. No change in other meds for now. If any low blood sugars,return to prior dose.   Arm exam is reassuring today.  Try keeping a journal of how you sleep with your arm and when you wake up in pain to see if there is one certain position that makes it worse. You can also try wrapping a loose towel around the elbow to keep you from bending your arm. If not improving in the next 4 weeks or so let me know and we can look at other possibilities.   Return to the clinic or go to the nearest emergency room if any of your symptoms worsen or new symptoms occur.    If you have lab work done today you will be contacted with your lab results within the next 2 weeks.  If you have not heard from [place] then please contact us. The fastest way to get your results is to register for My Chart.   IF you received an x-ray today, you will receive an invoice from [facility]. Please contact [facility] at [phone] with questions or concerns regarding your invoice.   IF you received labwork today, you will receive an invoice from [place]. Please contact [facility] at [phone] with questions or concerns regarding your invoice.   Our billing staff will not be able to assist you with questions regarding bills from these companies.  You will be contacted with the lab results as soon as they are available. The fastest way to get your results is to activate your My Chart account. Instructions are located on the last page of this paperwork. If you have not heard from [place] regarding the results in 2 weeks, please contact this office.

## 2020-05-19 LAB — HEMOGLOBIN A1C
Est. average glucose Bld gHb Est-mCnc: 146 mg/dL
Hgb A1c MFr Bld: 6.7 % — ABNORMAL HIGH (ref 4.8–5.6)

## 2020-06-03 ENCOUNTER — Other Ambulatory Visit: Payer: Self-pay

## 2020-06-03 ENCOUNTER — Ambulatory Visit (HOSPITAL_COMMUNITY)
Admission: RE | Admit: 2020-06-03 | Discharge: 2020-06-03 | Disposition: A | Discharge status: Home / Self Care | Payer: Medicare Other | Source: Ambulatory Visit | Attending: Nurse Practitioner | Admitting: Nurse Practitioner

## 2020-06-03 ENCOUNTER — Other Ambulatory Visit: Payer: Self-pay | Admitting: Nurse Practitioner

## 2020-06-03 DIAGNOSIS — K862 Cyst of pancreas: Secondary | ICD-10-CM | POA: Diagnosis not present

## 2020-06-03 DIAGNOSIS — G8929 Other chronic pain: Secondary | ICD-10-CM | POA: Diagnosis not present

## 2020-06-03 DIAGNOSIS — R1031 Right lower quadrant pain: Secondary | ICD-10-CM | POA: Insufficient documentation

## 2020-06-03 DIAGNOSIS — N281 Cyst of kidney, acquired: Secondary | ICD-10-CM | POA: Diagnosis not present

## 2020-06-03 MED ORDER — GADOBUTROL 1 MMOL/ML IV SOLN
8.0000 mL | Freq: Once | INTRAVENOUS | Status: AC | PRN
  Administered 2020-06-03: 8 mL via INTRAVENOUS

## 2020-06-09 DIAGNOSIS — F3341 Major depressive disorder, recurrent, in partial remission: Secondary | ICD-10-CM | POA: Diagnosis not present

## 2020-06-10 DIAGNOSIS — F3341 Major depressive disorder, recurrent, in partial remission: Secondary | ICD-10-CM | POA: Diagnosis not present

## 2020-06-11 ENCOUNTER — Encounter: Payer: Self-pay | Admitting: Family Medicine

## 2020-06-11 ENCOUNTER — Other Ambulatory Visit: Payer: Self-pay

## 2020-06-11 ENCOUNTER — Ambulatory Visit (INDEPENDENT_AMBULATORY_CARE_PROVIDER_SITE_OTHER): Payer: Medicare Other | Admitting: Family Medicine

## 2020-06-11 VITALS — BP 103/70 | HR 105 | Temp 98.8°F | Ht 71.0 in | Wt 184.0 lb

## 2020-06-11 DIAGNOSIS — R1031 Right lower quadrant pain: Secondary | ICD-10-CM

## 2020-06-11 DIAGNOSIS — E1165 Type 2 diabetes mellitus with hyperglycemia: Secondary | ICD-10-CM

## 2020-06-11 DIAGNOSIS — M545 Low back pain, unspecified: Secondary | ICD-10-CM

## 2020-06-11 NOTE — Patient Instructions (Addendum)
Diabetes numbers look good. Ok to continue same meds, but watch for low readings. If that occurs, we need to decrease meds.   Pain in side could be nerve or muscular pain - possibly from back.  Can try gabapentin at bedtime for now. Let me know if that helps. Massage is ok. See other info on back below.   Chronic Back Pain When back pain lasts longer than 3 months, it is called chronic back pain. The cause of your back pain may not be known. Some common causes include:  Wear and tear (degenerative disease) of the bones, ligaments, or disks in your back.  Inflammation and stiffness in your back (arthritis). People who have chronic back pain often go through certain periods in which the pain is more intense (flare-ups). Many people can learn to manage the pain with home care. Follow these instructions at home: Pay attention to any changes in your symptoms. Take these actions to help with your pain: Managing pain and stiffness  If directed, apply ice to the painful area. Your health care provider may recommend applying ice during the first 24-48 hours after a flare-up begins. To do this: ? Put ice in a plastic bag. ? Place a towel between your skin and the bag. ? Leave the ice on for 20 minutes, 2-3 times per day.  If directed, apply heat to the affected area as often as told by your health care provider. Use the heat source that your health care provider recommends, such as a moist heat pack or a heating pad. ? Place a towel between your skin and the heat source. ? Leave the heat on for 20-30 minutes. ? Remove the heat if your skin turns bright red. This is especially important if you are unable to feel pain, heat, or cold. You may have a greater risk of getting burned.  Try soaking in a warm tub.      Activity  Avoid bending and other activities that make the problem worse.  Maintain a proper position when standing or sitting: ? When standing, keep your upper back and neck  straight, with your shoulders pulled back. Avoid slouching. ? When sitting, keep your back straight and relax your shoulders. Do not round your shoulders or pull them backward.  Do not sit or stand in one place for long periods of time.  Take brief periods of rest throughout the day. This will reduce your pain. Resting in a lying or standing position is usually better than sitting to rest.  When you are resting for longer periods, mix in some mild activity or stretching between periods of rest. This will help to prevent stiffness and pain.  Get regular exercise. Ask your health care provider what activities are safe for you.  Do not lift anything that is heavier than 10 lb (4.5 kg), or the limit that you are told, until your health care provider says that it is safe. Always use proper lifting technique, which includes: ? Bending your knees. ? Keeping the load close to your body. ? Avoiding twisting.  Sleep on a firm mattress in a comfortable position. Try lying on your side with your knees slightly bent. If you lie on your back, put a pillow under your knees.   Medicines  Treatment may include medicines for pain and inflammation taken by mouth or applied to the skin, prescription pain medicine, or muscle relaxants. Take over-the-counter and prescription medicines only as told by your health care provider.  Ask  your health care provider if the medicine prescribed to you: ? Requires you to avoid driving or using machinery. ? Can cause constipation. You may need to take these actions to prevent or treat constipation:  Drink enough fluid to keep your urine pale yellow.  Take over-the-counter or prescription medicines.  Eat foods that are high in fiber, such as beans, whole grains, and fresh fruits and vegetables.  Limit foods that are high in fat and processed sugars, such as fried or sweet foods. General instructions  Do not use any products that contain nicotine or tobacco, such as  cigarettes, e-cigarettes, and chewing tobacco. If you need help quitting, ask your health care provider.  Keep all follow-up visits as told by your health care provider. This is important. Contact a health care provider if:  You have pain that is not relieved with rest or medicine.  Your pain gets worse, or you have new pain.  You have a high fever.  You have rapid weight loss.  You have trouble doing your normal activities. Get help right away if:  You have weakness or numbness in one or both of your legs or feet.  You have trouble controlling your bladder or your bowels.  You have severe back pain and have any of the following: ? Nausea or vomiting. ? Pain in your abdomen. ? Shortness of breath or you faint. Summary  Chronic back pain is back pain that lasts longer than 3 months.  When a flare-up begins, apply ice to the painful area for the first 24-48 hours.  Apply a moist heat pad or use a heating pad on the painful area as directed by your health care provider.  When you are resting for longer periods, mix in some mild activity or stretching between periods of rest. This will help to prevent stiffness and pain. This information is not intended to replace advice given to you by your health care provider. Make sure you discuss any questions you have with your health care provider. Document Revised: 05/08/2019 Document Reviewed: 05/08/2019 Elsevier Patient Education  2021 [employer].   If you have lab work done today you will be contacted with your lab results within the next 2 weeks.  If you have not heard from [place] then please contact us. The fastest way to get your results is to register for My Chart.   IF you received an x-ray today, you will receive an invoice from [facility]. Please contact [facility] at [phone] with questions or concerns regarding your invoice.   IF you received labwork today, you will receive an invoice from [place].  Please contact [facility] at [phone] with questions or concerns regarding your invoice.   Our billing staff will not be able to assist you with questions regarding bills from these companies.  You will be contacted with the lab results as soon as they are available. The fastest way to get your results is to activate your My Chart account. Instructions are located on the last page of this paperwork. If you have not heard from [place] regarding the results in 2 weeks, please contact this office.

## 2020-06-11 NOTE — Progress Notes (Signed)
Subjective:  Patient ID: Andrew Tanner, male    DOB: 07-30-1950  Age: 70 y.o. MRN: [id number]  CC:  Chief Complaint  Patient presents with  . Abdominal Pain    Pt reports having pain in his Lower right side of his abdominal area.Pt reports pain hurts at night and all through the night. Pt reports his pain will go away at times during the day, but always comes back. PT reports he has had an MRI done and states he was told it came back normal. No pain currently. Pt reports he mainbly notices the pain when laying down and getting up.    HPI Andrew Tanner presents for   Right lower quadrant abdominal pain Discussed November 2021.  Followed by urology with history of BPH.  History of positive Cologuard,  Colonoscopy December 2021, Dr. Bertram Tanner polyps removed.  Diverticulosis in the sigmoid colon, descending colon and ascending colon.  No sign/source of abdominal pain on that study.  CT abdomen pelvis on January 3 with multiple small incidental pancreatic cysts, normal CT appearance of appendix.  No acute findings in abdomen or pelvis. MRI abdomen pelvis with and without contrast on February 23.  Fluid signal cystic lesions throughout the pancreas, clearly communicating with the pancreatic duct with many of those areas.  Largest 1.7 cm.  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  Recommend a repeat MRI in 1 year or pancreatic protocol CT. No current pain, notices getting up or laying down at times.  Pain during night at times. Sleeping ok. Same pain - still Rside. Improves in am, rare sx's during the day with sitting for awhile, not with walking. Min lower back pain on R side since fell on ice few months ago, more abd pain since that.  No bowel or bladder incontinence, no saddle anesthesia, no lower extremity weakness.  No unexplained weight loss, night sweats, fevers.  Has been trying IB Andrew Tanner past few weeks - no change. No planned GI follow  up.  Tx: none.  Off gabapentin for past year - no further neuropathy sx's. Told had tight muscles in low back in past.   Diabetes: Concern for decreased control at last visit with elevated home readings but A1c was improved at 6.7.  We had recommended initially increasing his glipizide from 15 mg twice a day to 20 mg twice a day but with continued on Jardiance, Metformin Has been taking higher dose Fasting 155 yesterday.  2hr PP 133 today.  No hypoglycemia.  Lab Results  Component Value Date   HGBA1C 6.7 (H) 05/18/2020   HGBA1C 7.8 (H) 01/20/2020   HGBA1C 7.5 (H) 09/12/2019   Lab Results  Component Value Date   MICROALBUR 3.8 (H) 03/10/2014   LDLCALC 107 (H) 01/20/2020   CREATININE 0.90 04/13/2020    History Patient Active Problem List   Diagnosis Date Noted  . Positive colorectal cancer screening using Cologuard test 03/16/2020  . Chronic RLQ pain 03/16/2020  . Diabetes mellitus type II 06/02/2011  . Hypertension 06/02/2011  . Hyperlipemia 06/02/2011  . Cataract 06/02/2011   Past Medical History:  Diagnosis Date  . Anxiety   . Cancer (Andrew Tanner)    basal cell skin CA  . Cataract   . Depression   . Diabetes mellitus without complication ([place])   . Hyperlipidemia   . Hypertension   . IC (interstitial cystitis)    Past Surgical History:  Procedure Laterality Date  . CATARACT EXTRACTION, BILATERAL    . HERNIA  REPAIR    . NECK SURGERY    . SPINE SURGERY    . TRANSURETHRAL RESECTION OF PROSTATE    . TURP VAPORIZATION     No Known Allergies Prior to Admission medications   Medication Sig Start Date End Date Taking? Authorizing Provider  alfuzosin (UROXATRAL) 10 MG 24 hr tablet Take 1 tablet by mouth once daily with breakfast 12/23/19  Yes Andrew Agreste, MD  atorvastatin (LIPITOR) 20 MG tablet TAKE 1 TABLET BY MOUTH ONCE DAILY AT  6  PM 02/10/20  Yes Andrew Agreste, MD  clonazePAM (KLONOPIN) 0.5 MG tablet Take 0.5 mg by mouth 3 (three) times daily as needed.   Yes  [provider]  glipiZIDE (GLUCOTROL) 10 MG tablet Take 2 tablets (20 mg total) by mouth 2 (two) times daily before a meal. Patient taking differently: Take 10 mg by mouth 3 (three) times daily before meals. 05/18/20  Yes Andrew Agreste, MD  glucose blood test strip 1 each by Other route daily. Use as instructed checking once per day, or if new symptoms. 05/18/20  Yes Andrew Agreste, MD  hydrocortisone 2.5 % ointment APPLY TO AFFECTED AREA EVERYDAY AT BEDTIME 08/19/19  Yes Andrew Monarch, MD  JARDIANCE 25 MG TABS tablet TAKE 1 TABLET BY MOUTH ONCE DAILY BEFORE BREAKFAST 02/10/20  Yes Andrew Agreste, MD  lisinopril-hydrochlorothiazide (ZESTORETIC) 20-25 MG tablet Take 1 tablet by mouth once daily 02/10/20  Yes Andrew Agreste, MD  metFORMIN (GLUCOPHAGE) 500 MG tablet TAKE 1 TABLET BY MOUTH THREE TIMES DAILY 04/07/20  Yes Andrew Agreste, MD  Misc Natural Products (PUMPKIN SEED OIL PO) Take by mouth.   Yes [provider]  MYRBETRIQ 50 MG TB24 tablet Take 50 mg by mouth daily. 01/16/20  Yes [provider]  Turmeric (QC TUMERIC COMPLEX PO) Take by mouth.   Yes [provider]  escitalopram (LEXAPRO) 10 MG tablet Take 10 mg by mouth daily. Patient not taking: Reported on 06/11/2020 01/22/20   [provider]  gabapentin (NEURONTIN) 300 MG capsule Take 1 capsule (300 mg total) by mouth 2 (two) times daily. Patient not taking: Reported on 06/11/2020 12/31/18   Andrew Agreste, MD  ibuprofen (ADVIL,MOTRIN) 800 MG tablet Take 1 tablet (800 mg total) by mouth every 8 (eight) hours as needed for pain. Patient not taking: Reported on 06/11/2020 01/09/12   Andrew Knapp, MD   Social History   Socioeconomic History  . Marital status: Married    Spouse name: Andrew Tanner  . Number of children: Not on file  . Years of education: Not on file  . Highest education level: Not on file  Occupational History  . Not on file  Tobacco Use  . Smoking status: Former  Research scientist (life sciences)  . Smokeless tobacco: Never Used  . Tobacco comment: quit 28 yrs ago  Vaping Use  . Vaping Use: Never used  Substance and Sexual Activity  . Alcohol use: Yes    Comment: 1 glass wine per week  . Drug use: No  . Sexual activity: Never  Other Topics Concern  . Not on file  Social History Narrative  . Not on file   Social Determinants of Health   Financial Resource Strain: Not on file  Food Insecurity: Not on file  Transportation Needs: Not on file  Physical Activity: Not on file  Stress: Not on file  Social Connections: Not on file  Intimate Partner Violence: Not on file    Review  of Systems Per HPI  Objective:   Vitals:   06/11/20 1316  BP: 103/70  Pulse: (!) 105  Temp: 98.8 F (37.1 C)  TempSrc: Temporal  SpO2: 97%  Weight: 184 lb (83.5 kg)  Height: 5\' 11"  (1.803 m)     Physical Exam Constitutional:      General: He is not in acute distress.    Appearance: He is well-developed and well-nourished.  HENT:     Head: Normocephalic and atraumatic.  Cardiovascular:     Rate and Rhythm: Normal rate.  Pulmonary:     Effort: Pulmonary effort is normal.  Abdominal:     General: There is no distension.     Palpations: Abdomen is soft. There is no mass.     Tenderness: There is no abdominal tenderness. There is no right CVA tenderness, left CVA tenderness or guarding.     Hernia: No hernia is present.     Comments: No tenderness on abdominal exam at present time.  Musculoskeletal:     Comments: No focal bony tenderness of the lumbar spine.  Overall intact range of motion but does experience the right sided lower abdominal pain with left lateral flexion.  Negative seated straight leg raise.  Skin:    General: Skin is warm and dry.  Neurological:     Mental Status: He is alert and oriented to person, place, and time.  Psychiatric:        Mood and Affect: Mood and affect normal.        Assessment & Plan:  Andrew Tanner is a 70 y.o. male  . Right-sided low back pain without sciatica, unspecified chronicity - Plan: DG Lumbar Spine Complete  Type 2 diabetes mellitus with hyperglycemia, without long-term current use of insulin (HCC)  RLQ abdominal pain  Commended on improved diabetes control, cautioned on possible hypoglycemia with increased dose of sulfonylurea.  No changes for now.  Imaging for right lower quadrant abdominal pain as above, possible previous pancreatitis/sequelae of previous pancreatitis.  Abdomen is soft, nontender this time.  Doubt diverticulitis, doubt intra-abdominal process.  Some reproduction with lumbar range of motion, possible neuronal impingement from lumbar spine, possible MSK source.  Some worsening symptoms after back pain, fall few months ago on ice.  -Check imaging of lumbar spine, trial of restart of his gabapentin at bedtime, with update on symptoms.  Handout given on back pain.  RTC precautions given.  No orders of the defined types were placed in this encounter.  Patient Instructions     Diabetes numbers look good. Ok to continue same meds, but watch for low readings. If that occurs, we need to decrease meds.   Pain in side could be nerve or muscular pain - possibly from back.  Can try gabapentin at bedtime for now. Let me know if that helps. Massage is ok. See other info on back below.   Chronic Back Pain When back pain lasts longer than 3 months, it is called chronic back pain. The cause of your back pain may not be known. Some common causes include:  Wear and tear (degenerative disease) of the bones, ligaments, or disks in your back.  Inflammation and stiffness in your back (arthritis). People who have chronic back pain often go through certain periods in which the pain is more intense (flare-ups). Many people can learn to manage the pain with home care. Follow these instructions at home: Pay attention to any changes in your symptoms. Take these actions to help with your  pain: Managing pain and stiffness  If directed, apply ice to the painful area. Your health care provider may recommend applying ice during the first 24-48 hours after a flare-up begins. To do this: ? Put ice in a plastic bag. ? Place a towel between your skin and the bag. ? Leave the ice on for 20 minutes, 2-3 times per day.  If directed, apply heat to the affected area as often as told by your health care provider. Use the heat source that your health care provider recommends, such as a moist heat pack or a heating pad. ? Place a towel between your skin and the heat source. ? Leave the heat on for 20-30 minutes. ? Remove the heat if your skin turns bright red. This is especially important if you are unable to feel pain, heat, or cold. You may have a greater risk of getting burned.  Try soaking in a warm tub.      Activity  Avoid bending and other activities that make the problem worse.  Maintain a proper position when standing or sitting: ? When standing, keep your upper back and neck straight, with your shoulders pulled back. Avoid slouching. ? When sitting, keep your back straight and relax your shoulders. Do not round your shoulders or pull them backward.  Do not sit or stand in one place for long periods of time.  Take brief periods of rest throughout the day. This will reduce your pain. Resting in a lying or standing position is usually better than sitting to rest.  When you are resting for longer periods, mix in some mild activity or stretching between periods of rest. This will help to prevent stiffness and pain.  Get regular exercise. Ask your health care provider what activities are safe for you.  Do not lift anything that is heavier than 10 lb (4.5 kg), or the limit that you are told, until your health care provider says that it is safe. Always use proper lifting technique, which includes: ? Bending your knees. ? Keeping the load close to your body. ? Avoiding  twisting.  Sleep on a firm mattress in a comfortable position. Try lying on your side with your knees slightly bent. If you lie on your back, put a pillow under your knees.   Medicines  Treatment may include medicines for pain and inflammation taken by mouth or applied to the skin, prescription pain medicine, or muscle relaxants. Take over-the-counter and prescription medicines only as told by your health care provider.  Ask your health care provider if the medicine prescribed to you: ? Requires you to avoid driving or using machinery. ? Can cause constipation. You may need to take these actions to prevent or treat constipation:  Drink enough fluid to keep your urine pale yellow.  Take over-the-counter or prescription medicines.  Eat foods that are high in fiber, such as beans, whole grains, and fresh fruits and vegetables.  Limit foods that are high in fat and processed sugars, such as fried or sweet foods. General instructions  Do not use any products that contain nicotine or tobacco, such as cigarettes, e-cigarettes, and chewing tobacco. If you need help quitting, ask your health care provider.  Keep all follow-up visits as told by your health care provider. This is important. Contact a health care provider if:  You have pain that is not relieved with rest or medicine.  Your pain gets worse, or you have new pain.  You have a high fever.  You  have rapid weight loss.  You have trouble doing your normal activities. Get help right away if:  You have weakness or numbness in one or both of your legs or feet.  You have trouble controlling your bladder or your bowels.  You have severe back pain and have any of the following: ? Nausea or vomiting. ? Pain in your abdomen. ? Shortness of breath or you faint. Summary  Chronic back pain is back pain that lasts longer than 3 months.  When a flare-up begins, apply ice to the painful area for the first 24-48 hours.  Apply a moist  heat pad or use a heating pad on the painful area as directed by your health care provider.  When you are resting for longer periods, mix in some mild activity or stretching between periods of rest. This will help to prevent stiffness and pain. This information is not intended to replace advice given to you by your health care provider. Make sure you discuss any questions you have with your health care provider. Document Revised: 05/08/2019 Document Reviewed: 05/08/2019 Elsevier Patient Education  2021 [employer].   If you have lab work done today you will be contacted with your lab results within the next 2 weeks.  If you have not heard from [place] then please contact us. The fastest way to get your results is to register for My Chart.   IF you received an x-ray today, you will receive an invoice from [facility]. Please contact [facility] at [phone] with questions or concerns regarding your invoice.   IF you received labwork today, you will receive an invoice from [place]. Please contact [facility] at [phone] with questions or concerns regarding your invoice.   Our billing staff will not be able to assist you with questions regarding bills from these companies.  You will be contacted with the lab results as soon as they are available. The fastest way to get your results is to activate your My Chart account. Instructions are located on the last page of this paperwork. If you have not heard from [place] regarding the results in 2 weeks, please contact this office.         Signed, [name], MD [facility]

## 2020-06-15 NOTE — Telephone Encounter (Signed)
Pt has questions about rational for abdominal XR and other reasoning's please advise

## 2020-06-18 ENCOUNTER — Other Ambulatory Visit: Payer: Self-pay

## 2020-06-18 ENCOUNTER — Ambulatory Visit
Admission: RE | Admit: 2020-06-18 | Discharge: 2020-06-18 | Disposition: A | Discharge status: Home / Self Care | Payer: Medicare Other | Source: Ambulatory Visit | Attending: Family Medicine | Admitting: Family Medicine

## 2020-06-18 DIAGNOSIS — M545 Low back pain, unspecified: Secondary | ICD-10-CM

## 2020-06-19 ENCOUNTER — Other Ambulatory Visit: Payer: Self-pay | Admitting: Family Medicine

## 2020-06-19 DIAGNOSIS — S32030A Wedge compression fracture of third lumbar vertebra, initial encounter for closed fracture: Secondary | ICD-10-CM

## 2020-06-20 ENCOUNTER — Other Ambulatory Visit: Payer: Self-pay | Admitting: Family Medicine

## 2020-06-20 DIAGNOSIS — E1165 Type 2 diabetes mellitus with hyperglycemia: Secondary | ICD-10-CM

## 2020-06-20 NOTE — Telephone Encounter (Signed)
Requested Prescriptions  Pending Prescriptions Disp Refills  . metFORMIN (GLUCOPHAGE) 500 MG tablet [Pharmacy Med Name: metFORMIN HCl 500 MG Oral Tablet] 270 tablet 0    Sig: TAKE 1 TABLET BY MOUTH THREE TIMES DAILY     Endocrinology:  Diabetes - Biguanides Passed - 06/20/2020 10:55 AM      Passed - Cr in normal range and within 360 days    Creat  Date Value Ref Range Status  03/10/2014 0.88 0.50 - 1.35 mg/dL Final   Creatinine, Ser  Date Value Ref Range Status  04/13/2020 0.90 0.61 - 1.24 mg/dL Final         Passed - HBA1C is between 0 and 7.9 and within 180 days    Hgb A1c MFr Bld  Date Value Ref Range Status  05/18/2020 6.7 (H) 4.8 - 5.6 % Final    Comment:             Prediabetes: 5.7 - 6.4          Diabetes: >6.4          Glycemic control for adults with diabetes: <7.0          Passed - eGFR in normal range and within 360 days    GFR, Est African Andrew  Date Value Ref Range Status  03/10/2014 >89 mL/min Final   GFR calc Af Amer  Date Value Ref Range Status  01/20/2020 89 >59 mL/min/1.73 Final    Comment:    **[facility] currently reports eGFR in compliance with the current**   recommendations of the [employer]. [facility] will   update reporting as new guidelines are published from the NKF-ASN   Task force.    GFR, Est Non African Andrew  Date Value Ref Range Status  03/10/2014 >89 mL/min Final    Comment:      The estimated GFR is a calculation valid for adults (>=66 years old) that uses the CKD-EPI algorithm to adjust for age and sex. It is   not to be used for children, pregnant women, hospitalized patients,    patients on dialysis, or with rapidly changing kidney function. According to the NKDEP, eGFR >89 is normal, 60-89 shows mild impairment, 30-59 shows moderate impairment, 15-29 shows severe impairment and <15 is ESRD.      GFR calc non Af Amer  Date Value Ref Range Status  01/20/2020 77 >59 mL/min/1.73 Final         Passed -  Valid encounter within last 6 months    Recent Outpatient Visits          1 week ago Right-sided low back pain without sciatica, unspecified chronicity   [facility], Andrew Patrick, MD   1 month ago Type 2 diabetes mellitus with hyperglycemia, without long-term current use of insulin [facility])   [facility] at [facility], Andrew Patrick, MD   4 months ago RLQ abdominal pain   [facility] at [facility], Andrew Patrick, MD   5 months ago Type 2 diabetes mellitus with hyperglycemia, without long-term current use of insulin [facility])   [facility] at [facility], Andrew Quint, FNP   9 months ago Type 2 diabetes mellitus with hyperglycemia, without long-term current use of insulin [facility])   [facility] at [facility], Andrew Patrick, MD      Future Appointments            In 1 month [facility] Andrew Patrick, MD [place] [facility] [place], [facility]  In 1 month Andrew Tanner, Andrew Patrick, MD [place] [facility] [place], [facility]           . glipiZIDE (GLUCOTROL) 10 MG tablet [Pharmacy Med Name: glipiZIDE 10 MG Oral Tablet] 60 tablet     Sig: TAKE 2 TABLETS BY MOUTH TWICE DAILY BEFORE A MEAL     Endocrinology:  Diabetes - Sulfonylureas Passed - 06/20/2020 10:55 AM      Passed - HBA1C is between 0 and 7.9 and within 180 days    Hgb A1c MFr Bld  Date Value Ref Range Status  05/18/2020 6.7 (H) 4.8 - 5.6 % Final    Comment:             Prediabetes: 5.7 - 6.4          Diabetes: >6.4          Glycemic control for adults with diabetes: <7.0          Passed - Valid encounter within last 6 months    Recent Outpatient Visits          1 week ago Right-sided low back pain without sciatica, unspecified chronicity   [facility], Andrew Patrick, MD   1 month ago Type 2 diabetes mellitus with hyperglycemia, without long-term current use of insulin [facility])   [facility] at [facility], Andrew Patrick, MD   4 months ago RLQ abdominal pain   [facility] at [facility], Andrew Patrick, MD   5 months ago Type 2 diabetes mellitus with hyperglycemia, without long-term current use of insulin [facility])   [facility] at [facility], Andrew Quint, FNP   9 months ago Type 2 diabetes mellitus with hyperglycemia, without long-term current use of insulin [facility])   [facility] at [facility], Andrew Patrick, MD      Future Appointments            In 1 month Andrew T[facility] Andrew Patrick, MD [place] [facility] [place], [place]   In 1 month Andrew Tanner, Andrew Patrick, MD [place] [facility] [place], [facility]

## 2020-06-23 ENCOUNTER — Other Ambulatory Visit: Payer: Self-pay | Admitting: Family Medicine

## 2020-06-23 NOTE — Telephone Encounter (Signed)
Requested medication (s) are due for refill today:yes  Requested medication (s) are on the active medication list: yes  Last refill:  02/10/20  Future visit scheduled: yes  Notes to clinic:  insurance requests alternate medication    Requested Prescriptions  Pending Prescriptions Disp Refills   JARDIANCE 25 MG TABS tablet [Pharmacy Med Name: Jardiance 25 MG Oral Tablet] 90 tablet 0    Sig: TAKE 1 TABLET BY [place]      Endocrinology:  Diabetes - SGLT2 Inhibitors Failed - 06/23/2020 10:25 AM      Failed - LDL in normal range and within 360 days    LDL Chol Calc (NIH)  Date Value Ref Range Status  01/20/2020 107 (H) 0 - 99 mg/dL Final          Passed - Cr in normal range and within 360 days    Creat  Date Value Ref Range Status  03/10/2014 0.88 0.50 - 1.35 mg/dL Final   Creatinine, Ser  Date Value Ref Range Status  04/13/2020 0.90 0.61 - 1.24 mg/dL Final          Passed - HBA1C is between 0 and 7.9 and within 180 days    Hgb A1c MFr Bld  Date Value Ref Range Status  05/18/2020 6.7 (H) 4.8 - 5.6 % Final    Comment:             Prediabetes: 5.7 - 6.4          Diabetes: >6.4          Glycemic control for adults with diabetes: <7.0           Passed - eGFR in normal range and within 360 days    GFR, Est African American  Date Value Ref Range Status  03/10/2014 >89 mL/min Final   GFR calc Af Amer  Date Value Ref Range Status  01/20/2020 89 >59 mL/min/1.73 Final    Comment:    **[facility] currently reports eGFR in compliance with the current**   recommendations of the [employer]. [facility] will   update reporting as new guidelines are published from the NKF-ASN   Task force.    GFR, Est Non African American  Date Value Ref Range Status  03/10/2014 >89 mL/min Final    Comment:      The estimated GFR is a calculation valid for adults (>=55 years old) that uses the CKD-EPI algorithm to adjust for age and sex. It is   not  to be used for children, pregnant women, hospitalized patients,    patients on dialysis, or with rapidly changing kidney function. According to the NKDEP, eGFR >89 is normal, 60-89 shows mild impairment, 30-59 shows moderate impairment, 15-29 shows severe impairment and <15 is ESRD.      GFR calc non Af Amer  Date Value Ref Range Status  01/20/2020 77 >59 mL/min/1.73 Final          Passed - Valid encounter within last 6 months    Recent Outpatient Visits           1 week ago Right-sided low back pain without sciatica, unspecified chronicity   [facility] at [place], MD   1 month ago Type 2 diabetes mellitus with hyperglycemia, without long-term current use of insulin [facility])   [facility] at [place], [name], MD   4 months ago RLQ abdominal pain   [facility] at [place], [name], MD  5 months ago Type 2 diabetes mellitus with hyperglycemia, without long-term current use of insulin ([place])   Primary Care at [place], Laurita Quint, FNP   9 months ago Type 2 diabetes mellitus with hyperglycemia, without long-term current use of insulin [facility])   [facility] at [place], [name], MD       Future Appointments             In 1 month Carlota Raspberry [name], MD [place] [facility] [place], [place]   In 1 month Carlota Raspberry, [name], MD [place] [facility] [place], [facility]

## 2020-07-14 ENCOUNTER — Other Ambulatory Visit: Payer: Self-pay | Admitting: Family Medicine

## 2020-07-20 ENCOUNTER — Ambulatory Visit (INDEPENDENT_AMBULATORY_CARE_PROVIDER_SITE_OTHER): Payer: Medicare Other | Admitting: Registered Nurse

## 2020-07-20 ENCOUNTER — Other Ambulatory Visit: Payer: Self-pay

## 2020-07-20 ENCOUNTER — Encounter: Payer: Self-pay | Admitting: Registered Nurse

## 2020-07-20 VITALS — BP 138/76 | HR 91 | Temp 98.3°F | Resp 16 | Ht 71.0 in | Wt 184.6 lb

## 2020-07-20 DIAGNOSIS — M47812 Spondylosis without myelopathy or radiculopathy, cervical region: Secondary | ICD-10-CM | POA: Diagnosis not present

## 2020-07-20 DIAGNOSIS — M542 Cervicalgia: Secondary | ICD-10-CM | POA: Diagnosis not present

## 2020-07-20 MED ORDER — METHOCARBAMOL 500 MG PO TABS: 500.0000 mg | ORAL_TABLET | Freq: Two times a day (BID) | ORAL | 0 refills | Status: DC | PRN

## 2020-07-20 MED ORDER — DICLOFENAC SODIUM 75 MG PO TBEC: 75.0000 mg | DELAYED_RELEASE_TABLET | Freq: Two times a day (BID) | ORAL | 0 refills | Status: DC | PRN

## 2020-07-20 NOTE — Progress Notes (Signed)
Acute Office Visit  Subjective:    Patient ID: Andrew Tanner, male    DOB: 01-14-1951, 70 y.o.   MRN: [id number]  Chief Complaint  Patient presents with  . Neck Pain    Pt reports has had 2 neck surgery had hardware placed about 4-5 years ago, having some abnormal pain. 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 Most recent imaging of c spine is 2013 MRI which shows extensive degenerative changes.  Denies symptoms of radiculopathy or myelopathy.  Pain is a "discomfort" when he spends any more than a moment or two with his neck flexed. 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  He unfortunately did slip on ice and fall in January leading to an L3 fracture, though this is improving and he does not think it is related.  No saddle symptoms  Past Medical History:  Diagnosis Date  . Anxiety   . Cancer ([place])    basal cell skin CA  . Cataract   . Depression   . Diabetes mellitus without complication ([place])   . Hyperlipidemia   . Hypertension   . IC (interstitial cystitis)     Past Surgical History:  Procedure Laterality Date  . CATARACT EXTRACTION, BILATERAL    . HERNIA REPAIR    . NECK SURGERY    . SPINE SURGERY    . TRANSURETHRAL RESECTION OF PROSTATE    . TURP VAPORIZATION      Family History  Problem Relation Age of Onset  . Heart disease Father   . Hyperlipidemia Father   . Diabetes Father        type 1  . Diabetes Brother   . Depression Brother   . Renal cancer Brother   . Lung cancer Mother   . Stomach cancer Neg Hx   . Colon cancer Neg Hx   . Pancreatic cancer Neg Hx   .  Esophageal cancer Neg Hx   . Rectal cancer Neg Hx     Social History   Socioeconomic History  . Marital status: Married    Spouse name: [name]  . Number of children: Not on file  . Years of education: Not on file  . Highest education level: Not on file  Occupational History  . Not on file  Tobacco Use  . Smoking status: Former Research scientist (life sciences)  . Smokeless tobacco: Never Used  . Tobacco comment: quit 28 yrs ago  Vaping Use  . Vaping Use: Never used  Substance and Sexual Activity  . Alcohol use: Yes    Comment: 1 glass wine per week  . Drug use: No  . Sexual activity: Never  Other Topics Concern  . Not on file  Social History Narrative  . Not on file   Social Determinants of Health   Financial Resource Strain: Not on file  Food Insecurity: Not on file  Transportation Needs: Not on file  Physical Activity: Not on file  Stress: Not on file  Social Connections: Not on file  Intimate Partner Violence: Not on file  Outpatient Medications Prior to Visit  Medication Sig Dispense Refill  . alfuzosin (UROXATRAL) 10 MG 24 hr tablet Take 1 tablet by mouth once daily with breakfast 90 tablet 0  . atorvastatin (LIPITOR) 20 MG tablet TAKE 1 TABLET BY MOUTH ONCE DAILY AT  6  PM 90 tablet 3  . clonazePAM (KLONOPIN) 0.5 MG tablet Take 0.5 mg by mouth 3 (three) times daily as needed.    Marland Kitchen escitalopram (LEXAPRO) 10 MG tablet Take 10 mg by mouth daily.    Marland Kitchen gabapentin (NEURONTIN) 300 MG capsule Take 1 capsule (300 mg total) by mouth 2 (two) times daily. 180 capsule 0  . glipiZIDE (GLUCOTROL) 10 MG tablet Take 2 tablets (20 mg total) by mouth 2 (two) times daily before a meal. (Patient taking differently: Take 10 mg by mouth 3 (three) times daily before meals.) 60 tablet 2  . glucose blood test strip 1 each by Other route daily. Use as instructed checking once per day, or if new symptoms. 50 each 8  . hydrocortisone 2.5 % ointment APPLY TO AFFECTED AREA EVERYDAY AT BEDTIME 29 g 0  .  ibuprofen (ADVIL,MOTRIN) 800 MG tablet Take 1 tablet (800 mg total) by mouth every 8 (eight) hours as needed for pain. 30 tablet 0  . JARDIANCE 25 MG TABS tablet TAKE 1 TABLET BY MOUTH ONCE DAILY BEFORE BREAKFAST 90 tablet 0  . lisinopril-hydrochlorothiazide (ZESTORETIC) 20-25 MG tablet Take 1 tablet by mouth once daily 90 tablet 1  . metFORMIN (GLUCOPHAGE) 500 MG tablet TAKE 1 TABLET BY MOUTH THREE TIMES DAILY 270 tablet 0  . Misc Natural Products (PUMPKIN SEED OIL PO) Take by mouth.    Marland Kitchen MYRBETRIQ 50 MG TB24 tablet Take 50 mg by mouth daily.    . Turmeric (QC TUMERIC COMPLEX PO) Take by mouth.     No facility-administered medications prior to visit.    No Known Allergies  Review of Systems Per hpi      Objective:    Physical Exam Vitals and nursing note reviewed.  Constitutional:      Appearance: Normal appearance.  Cardiovascular:     Rate and Rhythm: Normal rate and regular rhythm.     Pulses: Normal pulses.     Heart sounds: Normal heart sounds. No murmur heard. No friction rub. No gallop.   Pulmonary:     Effort: Pulmonary effort is normal. No respiratory distress.     Breath sounds: Normal breath sounds. No stridor. No wheezing, rhonchi or rales.  Musculoskeletal:        General: No swelling, tenderness or signs of injury. Normal range of motion.  Skin:    General: Skin is warm and dry.     Capillary Refill: Capillary refill takes less than 2 seconds.  Neurological:     General: No focal deficit present.     Mental Status: He is alert. Mental status is at baseline.  Psychiatric:        Mood and Affect: Mood normal.        Behavior: Behavior normal.        Thought Content: Thought content normal.        Judgment: Judgment normal.     BP 138/76   Pulse 91   Temp 98.3 F (36.8 C) (Temporal)   Resp 16   Ht 5\' 11"  (1.803 m)   Wt 184 lb 9.6 oz (83.7 kg)   SpO2 97%   BMI 25.75 kg/m  Wt Readings from Last 3 Encounters:  07/20/20  184 lb 9.6 oz (83.7 kg)   06/11/20 184 lb (83.5 kg)  05/18/20 183 lb (83 kg)    There are no preventive care reminders to display for this patient.  There are no preventive care reminders to display for this patient.   Lab Results  Component Value Date   TSH 1.310 05/16/2017   Lab Results  Component Value Date   WBC 7.9 02/19/2020   HGB 16.1 02/19/2020   HCT 49.2 02/19/2020   MCV 91 02/19/2020   PLT 213 02/19/2020   Lab Results  Component Value Date   NA 134 01/20/2020   K 4.3 01/20/2020   CO2 22 01/20/2020   GLUCOSE 167 (H) 01/20/2020   BUN 22 01/20/2020   CREATININE 0.90 04/13/2020   BILITOT 0.3 01/20/2020   ALKPHOS 71 01/20/2020   AST 28 01/20/2020   ALT 35 01/20/2020   PROT 6.5 01/20/2020   ALBUMIN 4.4 01/20/2020   CALCIUM 10.2 01/20/2020   Lab Results  Component Value Date   CHOL 187 01/20/2020   Lab Results  Component Value Date   HDL 38 (L) 01/20/2020   Lab Results  Component Value Date   LDLCALC 107 (H) 01/20/2020   Lab Results  Component Value Date   TRIG 246 (H) 01/20/2020   Lab Results  Component Value Date   CHOLHDL 4.9 01/20/2020   Lab Results  Component Value Date   HGBA1C 6.7 (H) 05/18/2020       Assessment & Plan:   Problem List Items Addressed This Visit   None   Visit Diagnoses    Sore neck    -  Primary   Relevant Medications   diclofenac (VOLTAREN) 75 MG EC tablet   methocarbamol (ROBAXIN) 500 MG tablet   Other Relevant Orders   DG Cervical Spine Complete   DG Thoracic Spine W/Swimmers   Spondylosis of cervical region without myelopathy or radiculopathy       Relevant Medications   diclofenac (VOLTAREN) 75 MG EC tablet   methocarbamol (ROBAXIN) 500 MG tablet   Other Relevant Orders   DG Cervical Spine Complete   DG Thoracic Spine W/Swimmers       Meds ordered this encounter  Medications  . diclofenac (VOLTAREN) 75 MG EC tablet    Sig: Take 1 tablet (75 mg total) by mouth 2 (two) times daily as needed.    Dispense:  30 tablet     Refill:  0    Order Specific Question:   Supervising Provider    Answer:   Carlota Raspberry, JEFFREY R [[id number]]  . methocarbamol (ROBAXIN) 500 MG tablet    Sig: Take 1 tablet (500 mg total) by mouth 2 (two) times daily as needed for muscle spasms.    Dispense:  30 tablet    Refill:  0    Order Specific Question:   Supervising Provider    Answer:   Carlota Raspberry, JEFFREY R [[id number]]   PLAN  I largely suspect that this is mostly a muscle strain given all of his recent activity, but given his history will order xrays of c spine and t spine. Should further degenerative changes or other concerns appear, will refer to neurosurgery. Otherwise continue conservative treatment and consider PT  Diclofenac and methocarbamol for pain. Discussed using each with caution and only as needed. Discussed r/b/se of each.  Return prn or as scheduled with PCP  Patient encouraged to call clinic with any questions, comments, or concerns.   Maximiano Coss, NP

## 2020-07-22 ENCOUNTER — Other Ambulatory Visit: Payer: Self-pay

## 2020-07-22 ENCOUNTER — Ambulatory Visit (HOSPITAL_BASED_OUTPATIENT_CLINIC_OR_DEPARTMENT_OTHER)
Admission: RE | Admit: 2020-07-22 | Discharge: 2020-07-22 | Disposition: A | Discharge status: Home / Self Care | Payer: Medicare Other | Source: Ambulatory Visit | Attending: Registered Nurse | Admitting: Registered Nurse

## 2020-07-22 DIAGNOSIS — M542 Cervicalgia: Secondary | ICD-10-CM | POA: Diagnosis not present

## 2020-07-22 DIAGNOSIS — Z96698 Presence of other orthopedic joint implants: Secondary | ICD-10-CM | POA: Diagnosis not present

## 2020-07-22 DIAGNOSIS — M47812 Spondylosis without myelopathy or radiculopathy, cervical region: Secondary | ICD-10-CM | POA: Insufficient documentation

## 2020-07-22 DIAGNOSIS — M1990 Unspecified osteoarthritis, unspecified site: Secondary | ICD-10-CM | POA: Diagnosis not present

## 2020-07-23 DIAGNOSIS — F3341 Major depressive disorder, recurrent, in partial remission: Secondary | ICD-10-CM | POA: Diagnosis not present

## 2020-07-27 ENCOUNTER — Encounter: Payer: Self-pay | Admitting: Registered Nurse

## 2020-07-27 ENCOUNTER — Other Ambulatory Visit: Payer: Self-pay

## 2020-07-27 ENCOUNTER — Other Ambulatory Visit: Payer: Self-pay | Admitting: Family Medicine

## 2020-07-27 DIAGNOSIS — E1165 Type 2 diabetes mellitus with hyperglycemia: Secondary | ICD-10-CM

## 2020-07-27 MED ORDER — GLIPIZIDE 10 MG PO TABS: ORAL_TABLET | ORAL | 0 refills | Status: DC

## 2020-07-28 NOTE — Telephone Encounter (Signed)
Patient would like a referral to physical therapy that takes his insurance.

## 2020-08-01 ENCOUNTER — Ambulatory Visit
Admission: RE | Admit: 2020-08-01 | Discharge: 2020-08-01 | Disposition: A | Discharge status: Home / Self Care | Payer: Medicare Other | Source: Ambulatory Visit | Attending: Family Medicine | Admitting: Family Medicine

## 2020-08-01 ENCOUNTER — Other Ambulatory Visit: Payer: Self-pay

## 2020-08-01 DIAGNOSIS — S32030A Wedge compression fracture of third lumbar vertebra, initial encounter for closed fracture: Secondary | ICD-10-CM

## 2020-08-01 DIAGNOSIS — M85852 Other specified disorders of bone density and structure, left thigh: Secondary | ICD-10-CM | POA: Diagnosis not present

## 2020-08-12 ENCOUNTER — Encounter: Payer: Self-pay | Admitting: Family Medicine

## 2020-08-12 ENCOUNTER — Other Ambulatory Visit: Payer: Self-pay

## 2020-08-12 ENCOUNTER — Ambulatory Visit (INDEPENDENT_AMBULATORY_CARE_PROVIDER_SITE_OTHER): Payer: Medicare Other | Admitting: Family Medicine

## 2020-08-12 VITALS — BP 120/74 | HR 87 | Temp 97.7°F | Ht 71.0 in | Wt 182.0 lb

## 2020-08-12 DIAGNOSIS — S32030A Wedge compression fracture of third lumbar vertebra, initial encounter for closed fracture: Secondary | ICD-10-CM | POA: Diagnosis not present

## 2020-08-12 DIAGNOSIS — M47812 Spondylosis without myelopathy or radiculopathy, cervical region: Secondary | ICD-10-CM

## 2020-08-12 DIAGNOSIS — M8580 Other specified disorders of bone density and structure, unspecified site: Secondary | ICD-10-CM

## 2020-08-12 DIAGNOSIS — M546 Pain in thoracic spine: Secondary | ICD-10-CM

## 2020-08-12 DIAGNOSIS — M542 Cervicalgia: Secondary | ICD-10-CM | POA: Diagnosis not present

## 2020-08-12 DIAGNOSIS — G8929 Other chronic pain: Secondary | ICD-10-CM

## 2020-08-12 NOTE — Patient Instructions (Addendum)
I would recommend meeting with endocrinology to discuss osteoporosis treatments as you did have a compression fracture (although you did have a fall). Let me know if I can place a referral. Ok to continue calcium and vitamin D for now. I'm glad that your lower back pain has improved.   I will refer you to back specialist but suspect physical therapy may be ordered initially and helpful. Diclofenac temporarily if needed for now.

## 2020-08-12 NOTE — Progress Notes (Signed)
Subjective:  Patient ID: Andrew Tanner, male    DOB: 23-Nov-1950  Age: 70 y.o. MRN: [id number]  CC:  Chief Complaint  Patient presents with  . Back Pain    Discuss X-ray results    HPI Andrew Tanner presents for    Back pain: Discussed in March, possibly contributed to right lower quadrant symptoms at that time.  Restarted on gabapentin - 300mg  QHS. Feels out of sorts on this and not helping.  Lumbar spine x-ray March 10 with recent appearing fracture of anterior superior endplate of the L3 vertebral body with mild impaction.  Osteoarthritic changes noted at several levels.  Thoracic spine x-ray with mild scoliosis, osteoarthritic changes at multiple levels without fracture or spondylolisthesis on April 14.  Bone density testing April 23 indicated osteopenia with T score -1.4 at left femoral neck, 0.6 at lumbar spine.  Back is doing better now, no pain now. Feels like it is healed. Has been prescribed robaxin and naprosyn by my colleague, previous note reviewed.- helped pain, not needing now. No side effects on those meds. Doing stretches.  Taking calcium and vitamin D. No current osteoporosis treatment.   Neck pain: Hx of cervical spondylosis, surgery 10-12 years ago - Dr. Marcial Pacas.  Pain with forward flexion or quick movement.  Some recurrence of soreness with remodel of house since November of last year.  No arm radiation, no weakness. Tx: occasional ASA. Would like to avoid surgery if possible. C-spine imaging 07/23/2020 reviewed with multilevel postoperative changes support hardware intact.  Osteoarthritic change at multiple levels, primarily involving the facets.  No fracture or spondylolisthesis, reversal of lordotic curvature noted.   Mid back pain: Off and on for years. No recent change in pain. Comes and goes - more with certain movement/lifting. Improves with rest or voltaren - not taking daily.   No bowel or bladder incontinence, no saddle anesthesia, no lower  extremity weakness.  No unexplained wt loss, night sweats or fevers.    History Patient Active Problem List   Diagnosis Date Noted  . Positive colorectal cancer screening using Cologuard test 03/16/2020  . Chronic RLQ pain 03/16/2020  . Diabetes mellitus type II 06/02/2011  . Hypertension 06/02/2011  . Hyperlipemia 06/02/2011  . Cataract 06/02/2011   Past Medical History:  Diagnosis Date  . Anxiety   . Cancer ([place])    basal cell skin CA  . Cataract   . Depression   . Diabetes mellitus without complication ([place])   . Hyperlipidemia   . Hypertension   . IC (interstitial cystitis)    Past Surgical History:  Procedure Laterality Date  . CATARACT EXTRACTION, BILATERAL    . HERNIA REPAIR    . NECK SURGERY    . SPINE SURGERY    . TRANSURETHRAL RESECTION OF PROSTATE    . TURP VAPORIZATION     No Known Allergies Prior to Admission medications   Medication Sig Start Date End Date Taking? Authorizing Provider  alfuzosin (UROXATRAL) 10 MG 24 hr tablet Take 1 tablet by mouth once daily with breakfast 07/15/20  Yes [name], MD  atorvastatin (LIPITOR) 20 MG tablet TAKE 1 TABLET BY MOUTH ONCE DAILY AT  6  PM 02/10/20  Yes [name], MD  clonazePAM (KLONOPIN) 0.5 MG tablet Take 0.5 mg by mouth 3 (three) times daily as needed.   Yes [provider]  diclofenac (VOLTAREN) 75 MG EC tablet Take 1 tablet (75 mg total) by mouth 2 (two) times daily as needed. 07/20/20  Yes Maximiano Coss, NP  escitalopram (LEXAPRO) 10 MG tablet Take 10 mg by mouth daily. 01/22/20  Yes [provider]  glipiZIDE (GLUCOTROL) 10 MG tablet TAKE 2 TABLETS BY MOUTH TWICE DAILY BEFORE A MEAL 07/27/20  Yes Maximiano Coss, NP  glucose blood test strip 1 each by Other route daily. Use as instructed checking once per day, or if new symptoms. 05/18/20  Yes [name], MD  hydrocortisone 2.5 % ointment APPLY TO AFFECTED AREA EVERYDAY AT BEDTIME 08/19/19  Yes Lavonna Monarch, MD  ibuprofen  (ADVIL,MOTRIN) 800 MG tablet Take 1 tablet (800 mg total) by mouth every 8 (eight) hours as needed for pain. 01/09/12  Yes Shawnee Knapp, MD  JARDIANCE 25 MG TABS tablet TAKE 1 TABLET BY MOUTH ONCE DAILY BEFORE BREAKFAST 06/23/20  Yes [name], MD  lisinopril-hydrochlorothiazide (ZESTORETIC) 20-25 MG tablet Take 1 tablet by mouth once daily 02/10/20  Yes [name], MD  metFORMIN (GLUCOPHAGE) 500 MG tablet TAKE 1 TABLET BY MOUTH THREE TIMES DAILY 06/20/20  Yes [name], MD  methocarbamol (ROBAXIN) 500 MG tablet Take 1 tablet (500 mg total) by mouth 2 (two) times daily as needed for muscle spasms. 07/20/20  Yes Maximiano Coss, NP  Misc Natural Products (PUMPKIN SEED OIL PO) Take by mouth.   Yes [provider]  MYRBETRIQ 50 MG TB24 tablet Take 50 mg by mouth daily. 01/16/20  Yes [provider]  Turmeric (QC TUMERIC COMPLEX PO) Take by mouth.   Yes [provider]   Social History   Socioeconomic History  . Marital status: Married    Spouse name: [name]  . Number of children: Not on file  . Years of education: Not on file  . Highest education level: Not on file  Occupational History  . Not on file  Tobacco Use  . Smoking status: Former Research scientist (life sciences)  . Smokeless tobacco: Never Used  . Tobacco comment: quit 28 yrs ago  Vaping Use  . Vaping Use: Never used  Substance and Sexual Activity  . Alcohol use: Yes    Comment: 1 glass wine per week  . Drug use: No  . Sexual activity: Never  Other Topics Concern  . Not on file  Social History Narrative  . Not on file   Social Determinants of Health   Financial Resource Strain: Not on file  Food Insecurity: Not on file  Transportation Needs: Not on file  Physical Activity: Not on file  Stress: Not on file  Social Connections: Not on file  Intimate Partner Violence: Not on file    Review of Systems Per [facility]Pi.   Objective:   Vitals:   08/12/20 0823  BP: 120/74  Pulse: 87  Temp: 97.7  F (36.5 C)  SpO2: 98%  Weight: 182 lb (82.6 kg)  Height: 5\' 11"  (1.803 m)     Physical Exam Constitutional:      General: He is not in acute distress.    Appearance: He is well-developed.  HENT:     Head: Normocephalic and atraumatic.  Cardiovascular:     Rate and Rhythm: Normal rate.  Pulmonary:     Effort: Pulmonary effort is normal.  Musculoskeletal:     Comments: C-spine: Well-healed scar posteriorly.  Slight decreased rotation, lateral flexion, significantly decreased extension.  No radicular symptoms. Equal upper extremity strength, grip strength.  Reflexes approximately 1+ at biceps, triceps, brachioradialis, appear to be equal.  Thoracic spine: Pain-free range of motion, no focal midline  bony tenderness but describes area of mid thoracic spine as area of discomfort.  Lumbar spine: Pain-free range of motion, able to ambulate without difficulty, no midline bony tenderness.  Neurological:     Mental Status: He is alert and oriented to person, place, and time.    34 minutes spent during visit, greater than 50% counseling and assimilation of information, chart review, and discussion of plan.    Assessment & Plan:  Jarron Curley is a 70 y.o. male . Sore neck - Plan: Ambulatory referral to Spine Surgery Spondylosis of cervical region without myelopathy or radiculopathy - Plan: Ambulatory referral to Spine Surgery Chronic midline thoracic back pain - Plan: Ambulatory referral to Spine Surgery  -Pain without weakness, no acute findings seen on most recent imaging.  Reassuring exam.  Will refer to spine specialist but suspect initial physical therapy trial.  Has anti-inflammatory, muscle relaxant if needed at home for flare.  Closed compression fracture of L3 lumbar vertebra, initial encounter (HCC) Osteopenia, unspecified location  -Osteopenia noted on bone density, but with compression fracture question of whether or not he may be eligible for osteoporosis treatment.   Discussed endocrine evaluation to review options but declined at this time.   No orders of the defined types were placed in this encounter.  Patient Instructions  I would recommend meeting with endocrinology to discuss osteoporosis treatments as you did have a compression fracture (although you did have a fall). Let me know if I can place a referral. Ok to continue calcium and vitamin D for now. I'm glad that your lower back pain has improved.   I will refer you to back specialist but suspect physical therapy may be ordered initially and helpful. Diclofenac temporarily if needed for now.           Signed, [name], MD [facility]

## 2020-08-17 ENCOUNTER — Other Ambulatory Visit: Payer: Self-pay

## 2020-08-17 ENCOUNTER — Encounter: Payer: Self-pay | Admitting: Family Medicine

## 2020-08-17 ENCOUNTER — Ambulatory Visit (INDEPENDENT_AMBULATORY_CARE_PROVIDER_SITE_OTHER): Payer: Medicare Other | Admitting: Family Medicine

## 2020-08-17 VITALS — BP 130/76 | HR 83 | Temp 98.2°F | Resp 16 | Ht 71.0 in | Wt 184.0 lb

## 2020-08-17 DIAGNOSIS — E1165 Type 2 diabetes mellitus with hyperglycemia: Secondary | ICD-10-CM

## 2020-08-17 DIAGNOSIS — E785 Hyperlipidemia, unspecified: Secondary | ICD-10-CM | POA: Diagnosis not present

## 2020-08-17 DIAGNOSIS — E162 Hypoglycemia, unspecified: Secondary | ICD-10-CM | POA: Diagnosis not present

## 2020-08-17 DIAGNOSIS — I10 Essential (primary) hypertension: Secondary | ICD-10-CM

## 2020-08-17 LAB — LIPID PANEL
Cholesterol: 160 mg/dL (ref 0–200)
HDL: 42.7 mg/dL (ref >39.00)
LDL Cholesterol: 92 mg/dL (ref 0–99)
NonHDL: 116.85
Total CHOL/HDL Ratio: 4
Triglycerides: 125 mg/dL (ref 0.0–149.0)
VLDL: 25 mg/dL (ref 0.0–40.0)

## 2020-08-17 LAB — HEMOGLOBIN A1C: Hgb A1c MFr Bld: 6.7 % — ABNORMAL HIGH (ref 4.6–6.5)

## 2020-08-17 LAB — COMPREHENSIVE METABOLIC PANEL
ALT: 21 U/L (ref 0–53)
AST: 16 U/L (ref 0–37)
Albumin: 4.2 g/dL (ref 3.5–5.2)
Alkaline Phosphatase: 62 U/L (ref 39–117)
BUN: 29 mg/dL — ABNORMAL HIGH (ref 6–23)
CO2: 23 mEq/L (ref 19–32)
Calcium: 10.5 mg/dL (ref 8.4–10.5)
Chloride: 101 mEq/L (ref 96–112)
Creatinine, Ser: 1.01 mg/dL (ref 0.40–1.50)
GFR: 75.02 mL/min (ref >60.00)
Glucose, Bld: 90 mg/dL (ref 70–99)
Potassium: 3.8 mEq/L (ref 3.5–5.1)
Sodium: 136 mEq/L (ref 135–145)
Total Bilirubin: 0.3 mg/dL (ref 0.2–1.2)
Total Protein: 6.6 g/dL (ref 6.0–8.3)

## 2020-08-17 MED ORDER — GABAPENTIN 300 MG PO CAPS: 300.0000 mg | ORAL_CAPSULE | Freq: Two times a day (BID) | ORAL | 1 refills | Status: DC | PRN

## 2020-08-17 NOTE — Progress Notes (Signed)
Subjective:  Patient ID: Andrew Tanner, male    DOB: 03-27-1951  Age: 70 y.o. MRN: [id number]  CC:  Chief Complaint  Patient presents with  . Diabetes    Pt doing well, pt reports 2-3 hypoglycemic episode. Once between meals dropped to 45, drank OJ felt better, would like to discuss how to better prevent this from occurring.     HPI Andrew Tanner presents for   Diabetes: Complicated by previous hyperglycemia, diabetic neuropathy,  but improved A1c in February at 6.7, previously 7.8.  Was tolerating glipizide 50 mg twice daily at that time.  Metformin 500 mg 3 times daily (did not tolerate higher doses), Jardiance 25 mg daily.  He had improved his weight with diet changes.  He is on ACE inhibitor and statin Does report 2-3 hypoglycemic episodes, once between meals a drop to 45 as above, resolved with drinking orange juice. Smaller lunch than usual - felt disoriented a few hours later. Few other times under 100 (70-80). Denies missed meals.  Usual readings:  Fasting: average 160.   130 this morning.  2 hr PP: 150 average.  Has been off gabapentin but would like to resume. Was helpful for interstitial cystitis as well.  Prior 324m BID.   Microalbumin: Normal ratio 12/31/2018 Optho, foot exam, pneumovax: Due for ophthalmology exam, foot exam. optho - 1 year ago - plans to schedule soon.  Foot exam today.   Lab Results  Component Value Date   HGBA1C 6.7 (H) 05/18/2020   HGBA1C 7.8 (H) 01/20/2020   HGBA1C 7.5 (H) 09/12/2019   Lab Results  Component Value Date   MICROALBUR 3.8 (H) 03/10/2014   LDLCALC 107 (H) 01/20/2020   CREATININE 0.90 04/13/2020    Hyperlipidemia: On lipitor as above.  Lab Results  Component Value Date   CHOL 187 01/20/2020   HDL 38 (L) 01/20/2020   LDLCALC 107 (H) 01/20/2020   TRIG 246 (H) 01/20/2020   CHOLHDL 4.9 01/20/2020   Lab Results  Component Value Date   ALT 35 01/20/2020   AST 28 01/20/2020   ALKPHOS 71 01/20/2020   BILITOT 0.3  01/20/2020    Hypertension: On lisinopril hct.  BP Readings from Last 3 Encounters:  08/17/20 130/76  08/12/20 120/74  07/20/20 138/76   Lab Results  Component Value Date   CREATININE 0.90 04/13/2020      History Patient Active Problem List   Diagnosis Date Noted  . Positive colorectal cancer screening using Cologuard test 03/16/2020  . Chronic RLQ pain 03/16/2020  . Diabetes mellitus type II 06/02/2011  . Hypertension 06/02/2011  . Hyperlipemia 06/02/2011  . Cataract 06/02/2011   Past Medical History:  Diagnosis Date  . Anxiety   . Cancer ([place]    basal cell skin CA  . Cataract   . Depression   . Diabetes mellitus without complication ([place]   . Hyperlipidemia   . Hypertension   . IC (interstitial cystitis)    Past Surgical History:  Procedure Laterality Date  . CATARACT EXTRACTION, BILATERAL    . HERNIA REPAIR    . NECK SURGERY    . SPINE SURGERY    . TRANSURETHRAL RESECTION OF PROSTATE    . TURP VAPORIZATION     No Known Allergies Prior to Admission medications   Medication Sig Start Date End Date Taking? Authorizing Provider  alfuzosin (UROXATRAL) 10 MG 24 hr tablet Take 1 tablet by mouth once daily with breakfast 07/15/20  Yes GWendie Agreste MD  atorvastatin (LIPITOR)  20 MG tablet TAKE 1 TABLET BY MOUTH ONCE DAILY AT  6  PM 02/10/20  Yes [name], MD  clonazePAM (KLONOPIN) 0.5 MG tablet Take 0.5 mg by mouth 3 (three) times daily as needed.   Yes [provider]  diclofenac (VOLTAREN) 75 MG EC tablet Take 1 tablet (75 mg total) by mouth 2 (two) times daily as needed. 07/20/20  Yes Maximiano Coss, NP  escitalopram (LEXAPRO) 10 MG tablet Take 10 mg by mouth daily. 01/22/20  Yes [provider]  glipiZIDE (GLUCOTROL) 10 MG tablet TAKE 2 TABLETS BY MOUTH TWICE DAILY BEFORE A MEAL 07/27/20  Yes Maximiano Coss, NP  glucose blood test strip 1 each by Other route daily. Use as instructed checking once per day, or if new symptoms. 05/18/20   Yes [name], MD  hydrocortisone 2.5 % ointment APPLY TO AFFECTED AREA EVERYDAY AT BEDTIME 08/19/19  Yes Lavonna Monarch, MD  ibuprofen (ADVIL,MOTRIN) 800 MG tablet Take 1 tablet (800 mg total) by mouth every 8 (eight) hours as needed for pain. 01/09/12  Yes Shawnee Knapp, MD  JARDIANCE 25 MG TABS tablet TAKE 1 TABLET BY MOUTH ONCE DAILY BEFORE BREAKFAST 06/23/20  Yes [name], MD  lisinopril-hydrochlorothiazide (ZESTORETIC) 20-25 MG tablet Take 1 tablet by mouth once daily 02/10/20  Yes [name], MD  metFORMIN (GLUCOPHAGE) 500 MG tablet TAKE 1 TABLET BY MOUTH THREE TIMES DAILY 06/20/20  Yes [name], MD  methocarbamol (ROBAXIN) 500 MG tablet Take 1 tablet (500 mg total) by mouth 2 (two) times daily as needed for muscle spasms. 07/20/20  Yes Maximiano Coss, NP  Misc Natural Products (PUMPKIN SEED OIL PO) Take by mouth.   Yes [provider]  MYRBETRIQ 50 MG TB24 tablet Take 50 mg by mouth daily. 01/16/20  Yes [provider]  Turmeric (QC TUMERIC COMPLEX PO) Take by mouth.   Yes [provider]   Social History   Socioeconomic History  . Marital status: Married    Spouse name: [name]  . Number of children: Not on file  . Years of education: Not on file  . Highest education level: Not on file  Occupational History  . Not on file  Tobacco Use  . Smoking status: Former Research scientist (life sciences)  . Smokeless tobacco: Never Used  . Tobacco comment: quit 28 yrs ago  Vaping Use  . Vaping Use: Never used  Substance and Sexual Activity  . Alcohol use: Yes    Comment: 1 glass wine per week  . Drug use: No  . Sexual activity: Never  Other Topics Concern  . Not on file  Social History Narrative  . Not on file   Social Determinants of Health   Financial Resource Strain: Not on file  Food Insecurity: Not on file  Transportation Needs: Not on file  Physical Activity: Not on file  Stress: Not on file  Social Connections: Not on file  Intimate  Partner Violence: Not on file    Review of Systems  Constitutional: Negative for fatigue and unexpected weight change.  Eyes: Negative for visual disturbance.  Respiratory: Negative for cough, chest tightness and shortness of breath.   Cardiovascular: Negative for chest pain, palpitations and leg swelling.  Gastrointestinal: Negative for abdominal pain and blood in stool.  Neurological: Negative for dizziness, light-headedness and headaches.     Objective:   Vitals:   08/17/20 1324  BP: 130/76  Pulse: 83  Resp: 16  Temp: 98.2 F (36.8  C)  TempSrc: Temporal  SpO2: 97%  Weight: 184 lb (83.5 kg)  Height: 5' 11"  (1.803 m)     Physical Exam Vitals reviewed.  Constitutional:      Appearance: He is well-developed.  HENT:     Head: Normocephalic and atraumatic.  Eyes:     Pupils: Pupils are equal, round, and reactive to light.  Neck:     Vascular: No carotid bruit or JVD.  Cardiovascular:     Rate and Rhythm: Normal rate and regular rhythm.     Heart sounds: Normal heart sounds. No murmur heard.   Pulmonary:     Effort: Pulmonary effort is normal.     Breath sounds: Normal breath sounds. No rales.  Skin:    General: Skin is warm and dry.  Neurological:     Mental Status: He is alert and oriented to person, place, and time.        Assessment & Plan:  Andrew Tanner is a 70 y.o. male . Type 2 diabetes mellitus with hyperglycemia, without long-term current use of insulin (HCC) - Plan: Hemoglobin A1c, gabapentin (NEURONTIN) 300 MG capsule  Essential hypertension  Hyperlipidemia, unspecified hyperlipidemia type - Plan: Comprehensive metabolic panel, Lipid panel  Hypoglycemia  Prior hyperglycemia, most recent A1c controlled.  Overall readings still running a little bit high at times but few hypoglycemic episodes.  Suspect this is been due to diet or decreased intake but may need to decrease glipizide dose if recurrent.  Handout given on hypoglycemia and  prevention discussed.  Advised to let me know if those episodes recur.  Check labs, continue Capozide, metformin same doses for now.  Continue statin, lisinopril, HCTZ. Restart gabapentin, lowest effective dose.  Meds ordered this encounter  Medications  . gabapentin (NEURONTIN) 300 MG capsule    Sig: Take 1 capsule (300 mg total) by mouth 2 (two) times daily as needed.    Dispense:  90 capsule    Refill:  1   Patient Instructions   See info on low blood sugar and precautions below. Make sure not to skip meals.  Depending on A1c can decide if med changes needed. Let me know if those lows happen again.   Gabapentin up to twice per day if needed.   Return to the clinic or go to the nearest emergency room if any of your symptoms worsen or new symptoms occur.  Hypoglycemia Hypoglycemia occurs when the level of sugar (glucose) in the blood is too low. Hypoglycemia can happen in people who have or do not have diabetes. It can develop quickly, and it can be a medical emergency. For most people, a blood glucose level below 70 mg/dL (3.9 mmol/L) is considered hypoglycemia. Glucose is a type of sugar that provides the body's main source of energy. Certain hormones (insulin and glucagon) control the level of glucose in the blood. Insulin lowers blood glucose, and glucagon raises blood glucose. Hypoglycemia can result from having too much insulin in the bloodstream, or from not eating enough food that contains glucose. You may also have reactive hypoglycemia, which happens within 4 hours after eating a meal. What are the causes? Hypoglycemia occurs most often in people who have diabetes and may be caused by:  Diabetes medicine.  Not eating enough, or not eating often enough.  Increased physical activity.  Drinking alcohol on an empty stomach. If you do not have diabetes, hypoglycemia may be caused by:  A tumor in the pancreas.  Not eating enough, or not eating for  long periods at a time  (fasting).  A severe infection or illness.  Problems after having bariatric surgery.  Organ failure, such as kidney or liver failure.  Certain medicines. What increases the risk? Hypoglycemia is more likely to develop in people who:  Have diabetes and take medicines to lower blood glucose.  Abuse alcohol.  Have a severe illness. What are the signs or symptoms? Symptoms vary depending on whether the condition is mild, moderate, or severe. Mild hypoglycemia  Hunger.  Anxiety.  Sweating and feeling clammy.  Dizziness or feeling light-headed.  Sleepiness or restless sleep.  Nausea.  Increased heart rate.  Headache.  Blurry vision.  Irritability.  Tingling or numbness around the mouth, lips, or tongue.  A change in coordination. Moderate hypoglycemia  Confusion and poor judgment.  Behavior changes.  Weakness.  Irregular heartbeat. Severe hypoglycemia Severe hypoglycemia is a medical emergency. It can cause:  Fainting.  Seizures.  Loss of consciousness (coma).  Death. How is this diagnosed? Hypoglycemia is diagnosed with a blood test to measure your blood glucose level. This blood test is done while you are having symptoms. Your health care provider may also do a physical exam and review your medical history. How is this treated? This condition can be treated by immediately eating or drinking something that contains sugar with 15 grams of rapid-acting carbohydrate, such as:  4 oz (120 mL) of fruit juice.  4-6 oz (120-150 mL) of regular soda (not diet soda).  8 oz (240 mL) of low-fat milk.  Several pieces of hard candy. Check food labels to find out how many to eat for 15 grams.  1 Tbsp (15 mL) of sugar or honey. Treating hypoglycemia if you have diabetes If you are alert and able to swallow safely, follow the 15:15 rule:  Take 15 grams of a rapid-acting carbohydrate. Talk with your health care provider about how much you should take. Options  for getting 15 grams of rapid-acting carbohydrate include: ? Glucose tablets (take 4 tablets). ? Several pieces of hard candy. Check food labels to find out how many pieces to eat for 15 grams. ? 4 oz (120 mL) of fruit juice. ? 4-6 oz (120-150 mL) of regular (not diet) soda. ? 1 Tbsp (15 mL) of honey or sugar.  Check your blood glucose 15 minutes after you take the carbohydrate.  If the repeat blood glucose level is still at or below 70 mg/dL (3.9 mmol/L), take 15 grams of a carbohydrate again.  If your blood glucose level does not increase above 70 mg/dL (3.9 mmol/L) after 3 tries, seek emergency medical care.  After your blood glucose level returns to normal, eat a meal or a snack within 1 hour.   Treating severe hypoglycemia Severe hypoglycemia is when your blood glucose level is at or below 54 mg/dL (3 mmol/L). Severe hypoglycemia is a medical emergency. Get medical help right away. If you have severe hypoglycemia and you cannot eat or drink, you will need to be given glucagon. A family member or close friend should learn how to check your blood glucose and how to give you glucagon. Ask your health care provider if you need to have an emergency glucagon kit available. Severe hypoglycemia may need to be treated in a hospital. The treatment may include getting glucose through an IV. You may also need treatment for the cause of your hypoglycemia. Follow these instructions at home: General instructions  Take over-the-counter and prescription medicines only as told by your health care  provider.  Monitor your blood glucose as told by your health care provider.  If you drink alcohol: ? Limit how much you use to:  0-1 drink a day for nonpregnant women.  0-2 drinks a day for men. ? Be aware of how much alcohol is in your drink. In the [place] one drink equals one 12 oz bottle of beer (355 mL), one 5 oz glass of wine (148 mL), or one 1 oz glass of hard liquor (44 mL).  Keep all follow-up  visits as told by your health care provider. This is important. If you have diabetes:  Always have a rapid-acting carbohydrate (15 grams) option with you to treat low blood glucose.  Follow your diabetes management plan as directed. Make sure you: ? Know the symptoms of hypoglycemia. It is important to treat it right away to prevent it from becoming severe. ? Check your blood glucose as often as told. Always check before and after exercise. ? Always check your blood glucose before you drive a motorized vehicle. ? Take your medicines as told. ? Follow your meal plan. Eat on time, and do not skip meals.  Share your diabetes management plan with people in your workplace, school, and household.  Carry a medical alert card or wear medical alert jewelry.   Contact a health care provider if:  You have problems keeping your blood glucose in your target range.  You have frequent episodes of hypoglycemia. Get help right away if:  You continue to have hypoglycemia symptoms after eating or drinking something that contains 15 grams of fast-acting carbohydrate and you cannot get your blood glucose above 70 mg/dL (3.9 mmol/L) while following the 15:15 rule.  Your blood glucose is at or below 54 mg/dL (3 mmol/L).  You have a seizure.  You faint. These symptoms may represent a serious problem that is an emergency. Do not wait to see if the symptoms will go away. Get medical help right away. Call your local emergency services (911 in the [place] Do not drive yourself to the hospital. Summary  Hypoglycemia occurs when the level of sugar (glucose) in the blood is too low.  Hypoglycemia can happen in people who have or do not have diabetes. It can develop quickly, and it can be a medical emergency.  Make sure you know the symptoms of hypoglycemia and how to treat it.  Always have a rapid-acting carbohydrate snack with you to treat low blood sugar. This information is not intended to replace advice  given to you by your health care provider. Make sure you discuss any questions you have with your health care provider. Document Revised: 02/20/2019 Document Reviewed: 02/20/2019 Elsevier Patient Education  2021 [place].        Signed, [name], MD [facility]

## 2020-08-17 NOTE — Patient Instructions (Addendum)
See info on low blood sugar and precautions below. Make sure not to skip meals.  Depending on A1c can decide if med changes needed. Let me know if those lows happen again.   Gabapentin up to twice per day if needed.   Return to the clinic or go to the nearest emergency room if any of your symptoms worsen or new symptoms occur.  Hypoglycemia Hypoglycemia occurs when the level of sugar (glucose) in the blood is too low. Hypoglycemia can happen in people who have or do not have diabetes. It can develop quickly, and it can be a medical emergency. For most people, a blood glucose level below 70 mg/dL (3.9 mmol/L) is considered hypoglycemia. Glucose is a type of sugar that provides the body's main source of energy. Certain hormones (insulin and glucagon) control the level of glucose in the blood. Insulin lowers blood glucose, and glucagon raises blood glucose. Hypoglycemia can result from having too much insulin in the bloodstream, or from not eating enough food that contains glucose. You may also have reactive hypoglycemia, which happens within 4 hours after eating a meal. What are the causes? Hypoglycemia occurs most often in people who have diabetes and may be caused by:  Diabetes medicine.  Not eating enough, or not eating often enough.  Increased physical activity.  Drinking alcohol on an empty stomach. If you do not have diabetes, hypoglycemia may be caused by:  A tumor in the pancreas.  Not eating enough, or not eating for long periods at a time (fasting).  A severe infection or illness.  Problems after having bariatric surgery.  Organ failure, such as kidney or liver failure.  Certain medicines. What increases the risk? Hypoglycemia is more likely to develop in people who:  Have diabetes and take medicines to lower blood glucose.  Abuse alcohol.  Have a severe illness. What are the signs or symptoms? Symptoms vary depending on whether the condition is mild, moderate, or  severe. Mild hypoglycemia  Hunger.  Anxiety.  Sweating and feeling clammy.  Dizziness or feeling light-headed.  Sleepiness or restless sleep.  Nausea.  Increased heart rate.  Headache.  Blurry vision.  Irritability.  Tingling or numbness around the mouth, lips, or tongue.  A change in coordination. Moderate hypoglycemia  Confusion and poor judgment.  Behavior changes.  Weakness.  Irregular heartbeat. Severe hypoglycemia Severe hypoglycemia is a medical emergency. It can cause:  Fainting.  Seizures.  Loss of consciousness (coma).  Death. How is this diagnosed? Hypoglycemia is diagnosed with a blood test to measure your blood glucose level. This blood test is done while you are having symptoms. Your health care provider may also do a physical exam and review your medical history. How is this treated? This condition can be treated by immediately eating or drinking something that contains sugar with 15 grams of rapid-acting carbohydrate, such as:  4 oz (120 mL) of fruit juice.  4-6 oz (120-150 mL) of regular soda (not diet soda).  8 oz (240 mL) of low-fat milk.  Several pieces of hard candy. Check food labels to find out how many to eat for 15 grams.  1 Tbsp (15 mL) of sugar or honey. Treating hypoglycemia if you have diabetes If you are alert and able to swallow safely, follow the 15:15 rule:  Take 15 grams of a rapid-acting carbohydrate. Talk with your health care provider about how much you should take. Options for getting 15 grams of rapid-acting carbohydrate include: ? Glucose tablets (take 4 tablets). ?  Several pieces of hard candy. Check food labels to find out how many pieces to eat for 15 grams. ? 4 oz (120 mL) of fruit juice. ? 4-6 oz (120-150 mL) of regular (not diet) soda. ? 1 Tbsp (15 mL) of honey or sugar.  Check your blood glucose 15 minutes after you take the carbohydrate.  If the repeat blood glucose level is still at or below 70  mg/dL (3.9 mmol/L), take 15 grams of a carbohydrate again.  If your blood glucose level does not increase above 70 mg/dL (3.9 mmol/L) after 3 tries, seek emergency medical care.  After your blood glucose level returns to normal, eat a meal or a snack within 1 hour.   Treating severe hypoglycemia Severe hypoglycemia is when your blood glucose level is at or below 54 mg/dL (3 mmol/L). Severe hypoglycemia is a medical emergency. Get medical help right away. If you have severe hypoglycemia and you cannot eat or drink, you will need to be given glucagon. A family member or close friend should learn how to check your blood glucose and how to give you glucagon. Ask your health care provider if you need to have an emergency glucagon kit available. Severe hypoglycemia may need to be treated in a hospital. The treatment may include getting glucose through an IV. You may also need treatment for the cause of your hypoglycemia. Follow these instructions at home: General instructions  Take over-the-counter and prescription medicines only as told by your health care provider.  Monitor your blood glucose as told by your health care provider.  If you drink alcohol: ? Limit how much you use to:  0-1 drink a day for nonpregnant women.  0-2 drinks a day for men. ? Be aware of how much alcohol is in your drink. In the [place] one drink equals one 12 oz bottle of beer (355 mL), one 5 oz glass of wine (148 mL), or one 1 oz glass of hard liquor (44 mL).  Keep all follow-up visits as told by your health care provider. This is important. If you have diabetes:  Always have a rapid-acting carbohydrate (15 grams) option with you to treat low blood glucose.  Follow your diabetes management plan as directed. Make sure you: ? Know the symptoms of hypoglycemia. It is important to treat it right away to prevent it from becoming severe. ? Check your blood glucose as often as told. Always check before and after  exercise. ? Always check your blood glucose before you drive a motorized vehicle. ? Take your medicines as told. ? Follow your meal plan. Eat on time, and do not skip meals.  Share your diabetes management plan with people in your workplace, school, and household.  Carry a medical alert card or wear medical alert jewelry.   Contact a health care provider if:  You have problems keeping your blood glucose in your target range.  You have frequent episodes of hypoglycemia. Get help right away if:  You continue to have hypoglycemia symptoms after eating or drinking something that contains 15 grams of fast-acting carbohydrate and you cannot get your blood glucose above 70 mg/dL (3.9 mmol/L) while following the 15:15 rule.  Your blood glucose is at or below 54 mg/dL (3 mmol/L).  You have a seizure.  You faint. These symptoms may represent a serious problem that is an emergency. Do not wait to see if the symptoms will go away. Get medical help right away. Call your local emergency services (911 in the  [place] Do not drive yourself to the hospital. Summary  Hypoglycemia occurs when the level of sugar (glucose) in the blood is too low.  Hypoglycemia can happen in people who have or do not have diabetes. It can develop quickly, and it can be a medical emergency.  Make sure you know the symptoms of hypoglycemia and how to treat it.  Always have a rapid-acting carbohydrate snack with you to treat low blood sugar. This information is not intended to replace advice given to you by your health care provider. Make sure you discuss any questions you have with your health care provider. Document Revised: 02/20/2019 Document Reviewed: 02/20/2019 Elsevier Patient Education  2021 [employer].

## 2020-08-24 ENCOUNTER — Other Ambulatory Visit: Payer: Self-pay | Admitting: Registered Nurse

## 2020-08-24 DIAGNOSIS — E1165 Type 2 diabetes mellitus with hyperglycemia: Secondary | ICD-10-CM

## 2020-08-31 DIAGNOSIS — M542 Cervicalgia: Secondary | ICD-10-CM | POA: Diagnosis not present

## 2020-08-31 DIAGNOSIS — G959 Disease of spinal cord, unspecified: Secondary | ICD-10-CM | POA: Diagnosis not present

## 2020-09-05 DIAGNOSIS — M542 Cervicalgia: Secondary | ICD-10-CM | POA: Diagnosis not present

## 2020-09-05 DIAGNOSIS — M546 Pain in thoracic spine: Secondary | ICD-10-CM | POA: Diagnosis not present

## 2020-09-11 DIAGNOSIS — M546 Pain in thoracic spine: Secondary | ICD-10-CM | POA: Diagnosis not present

## 2020-09-18 DIAGNOSIS — M5412 Radiculopathy, cervical region: Secondary | ICD-10-CM | POA: Diagnosis not present

## 2020-09-23 ENCOUNTER — Other Ambulatory Visit: Payer: Self-pay | Admitting: Family Medicine

## 2020-09-23 DIAGNOSIS — I10 Essential (primary) hypertension: Secondary | ICD-10-CM

## 2020-09-24 ENCOUNTER — Telehealth: Payer: Self-pay | Admitting: Family Medicine

## 2020-09-24 NOTE — Telephone Encounter (Signed)
Left message for patient to schedule Annual Wellness Visit.  Please schedule with Nurse Health Advisor Julie Greer, RN at [facility]   

## 2020-10-01 DIAGNOSIS — M5412 Radiculopathy, cervical region: Secondary | ICD-10-CM | POA: Diagnosis not present

## 2020-10-14 ENCOUNTER — Other Ambulatory Visit: Payer: Self-pay | Admitting: Family Medicine

## 2020-10-15 DIAGNOSIS — M6281 Muscle weakness (generalized): Secondary | ICD-10-CM | POA: Diagnosis not present

## 2020-10-15 DIAGNOSIS — M542 Cervicalgia: Secondary | ICD-10-CM | POA: Diagnosis not present

## 2020-10-19 ENCOUNTER — Ambulatory Visit (INDEPENDENT_AMBULATORY_CARE_PROVIDER_SITE_OTHER): Payer: Medicare Other | Admitting: *Deleted

## 2020-10-19 DIAGNOSIS — Z0000 Encounter for general adult medical examination without abnormal findings: Secondary | ICD-10-CM | POA: Diagnosis not present

## 2020-10-19 NOTE — Progress Notes (Signed)
Subjective:   Andrew Tanner is a 70 y.o. male who presents for Medicare Annual/Subsequent preventive examination.  I connected with  Urbano Heir on 10/19/20 by a telephone enabled telemedicine application and verified that I am speaking with the correct person using two identifiers.   I discussed the limitations of evaluation and management by telemedicine. The patient expressed understanding and agreed to proceed.  Patient location: home  Provider location:  Tele-Health Visit    Review of Systems    NA Cardiac Risk Factors include: advanced age (>62men, >34 women);diabetes mellitus;hypertension     Objective:    Today's Vitals   There is no height or weight on file to calculate BMI.  Advanced Directives 10/19/2020 01/17/2017 12/22/2016  Does Patient Have a Medical Advance Directive? Yes No No  Type of Paramedic of [place];Living will - -  Copy of [place] in Chart? No - copy requested - -  Would patient like information on creating a medical advance directive? - Yes (ED - Information included in AVS) No - Patient declined    Current Medications (verified) Outpatient Encounter Medications as of 10/19/2020  Medication Sig   alfuzosin (UROXATRAL) 10 MG 24 hr tablet Take 1 tablet by mouth once daily with breakfast   atorvastatin (LIPITOR) 20 MG tablet TAKE 1 TABLET BY MOUTH ONCE DAILY AT  6  PM   clonazePAM (KLONOPIN) 0.5 MG tablet Take 0.5 mg by mouth 3 (three) times daily as needed.   diclofenac (VOLTAREN) 75 MG EC tablet Take 1 tablet (75 mg total) by mouth 2 (two) times daily as needed.   FLUoxetine (PROZAC) 40 MG capsule Take 40 mg by mouth daily. Take one table daily   gabapentin (NEURONTIN) 300 MG capsule Take 1 capsule (300 mg total) by mouth 2 (two) times daily as needed.   glipiZIDE (GLUCOTROL) 10 MG tablet TAKE 2 TABLETS BY MOUTH TWICE DAILY BEFORE A MEAL   glucose blood test strip 1 each by Other route daily.  Use as instructed checking once per day, or if new symptoms.   hydrocortisone 2.5 % ointment APPLY TO AFFECTED AREA EVERYDAY AT BEDTIME   ibuprofen (ADVIL,MOTRIN) 800 MG tablet Take 1 tablet (800 mg total) by mouth every 8 (eight) hours as needed for pain.   JARDIANCE 25 MG TABS tablet TAKE 1 TABLET BY MOUTH ONCE DAILY BEFORE BREAKFAST   lisinopril-hydrochlorothiazide (ZESTORETIC) 20-25 MG tablet Take 1 tablet by mouth once daily   metFORMIN (GLUCOPHAGE) 500 MG tablet TAKE 1 TABLET BY MOUTH THREE TIMES DAILY   methocarbamol (ROBAXIN) 500 MG tablet Take 1 tablet (500 mg total) by mouth 2 (two) times daily as needed for muscle spasms.   MYRBETRIQ 50 MG TB24 tablet Take 50 mg by mouth daily.   escitalopram (LEXAPRO) 10 MG tablet Take 10 mg by mouth daily. (Patient not taking: Reported on 10/19/2020)   Misc Natural Products (PUMPKIN SEED OIL PO) Take by mouth. (Patient not taking: Reported on 10/19/2020)   Turmeric (QC TUMERIC COMPLEX PO) Take by mouth. (Patient not taking: Reported on 10/19/2020)   No facility-administered encounter medications on file as of 10/19/2020.    Allergies (verified) Patient has no known allergies.   History: Past Medical History:  Diagnosis Date   Anxiety    Cancer ([place])    basal cell skin CA   Cataract    Depression    Diabetes mellitus without complication ([place])    Hyperlipidemia    Hypertension    IC (  interstitial cystitis)    Past Surgical History:  Procedure Laterality Date   CATARACT EXTRACTION, BILATERAL     HERNIA REPAIR     NECK SURGERY     SPINE SURGERY     TRANSURETHRAL RESECTION OF PROSTATE     TURP VAPORIZATION     Family History  Problem Relation Age of Onset   Heart disease Father    Hyperlipidemia Father    Diabetes Father        type 1   Diabetes Brother    Depression Brother    Renal cancer Brother    Lung cancer Mother    Stomach cancer Neg Hx    Colon cancer Neg Hx    Pancreatic cancer Neg Hx    Esophageal cancer Neg Hx     Rectal cancer Neg Hx    Social History   Socioeconomic History   Marital status: Married    Spouse name: [name]   Number of children: Not on file   Years of education: Not on file   Highest education level: Not on file  Occupational History   Not on file  Tobacco Use   Smoking status: Former    Pack years: 0.00   Smokeless tobacco: Never   Tobacco comments:    quit 28 yrs ago  Vaping Use   Vaping Use: Never used  Substance and Sexual Activity   Alcohol use: Yes    Comment: 1 glass wine per week   Drug use: No   Sexual activity: Never  Other Topics Concern   Not on file  Social History Narrative   Not on file   Social Determinants of Health   Financial Resource Strain: Low Risk    Difficulty of Paying Living Expenses: Not hard at all  Food Insecurity: No Food Insecurity   Worried About Charity fundraiser in the Last Year: Never true   [place] in the Last Year: Never true  Transportation Needs: No Transportation Needs   Lack of Transportation (Medical): No   Lack of Transportation (Non-Medical): No  Physical Activity: Sufficiently Active   Days of Exercise per Week: 7 days   Minutes of Exercise per Session: 50 min  Stress: No Stress Concern Present   Feeling of Stress : Not at all  Social Connections: Socially Integrated   Frequency of Communication with Friends and Family: More than three times a week   Frequency of Social Gatherings with Friends and Family: More than three times a week   Attends Religious Services: More than 4 times per year   Active Member of [employer] or Organizations: Yes   Attends Archivist Meetings: 1 to 4 times per year   Marital Status: Married    Tobacco Counseling Counseling given: Not Answered Tobacco comments: quit 28 yrs ago   Clinical Intake:  Pre-visit preparation completed: No  Pain : No/denies pain     Nutritional Risks: None  How often do you need to have someone help you when you read  instructions, pamphlets, or other written materials from your doctor or pharmacy?: 1 - Never  Diabetic?  Yes  Nutrition Risk Assessment:  Has the patient had any N/V/D within the last 2 months?  No  Does the patient have any non-healing wounds?  No  Has the patient had any unintentional weight loss or weight gain?  No   Diabetes:  Is the patient diabetic?  Yes  If diabetic, was a CBG obtained today?  No  Did the patient bring in their glucometer from home?  No  How often do you monitor your CBG's? 1 x day.   Financial Strains and Diabetes Management:  Are you having any financial strains with the device, your supplies or your medication? No .  Does the patient want to be seen by Chronic Care Management for management of their diabetes?  No  Would the patient like to be referred to a Nutritionist or for Diabetic Management?  No   Diabetic Exams:  Diabetic Eye Exam: Completed 11-2019.Pt has been advised about the importance in completing this exam.   Will be calling to schedule  Diabetic Foot Exam: Completed 08-17-2020. Pt has been advised about the importance in completing this exam.     Interpreter Needed?: No  Information entered by :: Leroy Kennedy LPN   Activities of Daily Living In your present state of health, do you have any difficulty performing the following activities: 10/19/2020  Hearing? N  Vision? N  Difficulty concentrating or making decisions? N  Walking or climbing stairs? N  Dressing or bathing? N  Doing errands, shopping? N  Preparing Food and eating ? N  Using the Toilet? N  In the past six months, have you accidently leaked urine? N  Do you have problems with loss of bowel control? N  Managing your Medications? N  Managing your Finances? N  Housekeeping or managing your Housekeeping? N  Some recent data might be hidden    Patient Care Team: [name], MD as PCP - General (Family Medicine) Thalia Bloodgood, [place] as Referring Physician  (Optometry)  Indicate any recent Medical Services you may have received from other than [facility] providers in the past year (date may be approximate).     Assessment:   This is a routine wellness examination for Rafan.  Hearing/Vision screen Hearing Screening - Comments:: No trouble hearing Vision Screening - Comments:: Patient is Due  will call [facility] [address]  Dietary issues and exercise activities discussed: Current Exercise Habits: Home exercise routine, Type of exercise: yoga;walking;Other - see comments (online /), Time (Minutes): 50, Frequency (Times/Week): 7, Weekly Exercise (Minutes/Week): 350, Intensity: Intense   Goals Addressed             This Visit's Progress    Patient Stated       Patient would like to continue healthy living          Depression Screen PHQ 2/9 Scores 10/19/2020 08/17/2020 07/20/2020 06/11/2020 05/18/2020 05/18/2020 02/19/2020  PHQ - 2 Score 0 0 0 0 3 0 3  PHQ- 9 Score - - - - 4 - 12  Exception Documentation - - - - - - -    Fall Risk Fall Risk  10/19/2020 08/17/2020 07/20/2020 06/11/2020 05/18/2020  Falls in the past year? 1 1 1  0 1  Number falls in past yr: 0 0 0 - 0  Injury with Fall? 1 1 1  - 1  Risk for fall due to : - History of fall(s) History of fall(s) - -  Follow up Falls evaluation completed;Falls prevention discussed Falls evaluation completed Falls evaluation completed Falls evaluation completed Falls evaluation completed    FALL RISK PREVENTION PERTAINING TO THE HOME:  Any stairs in or around the home? Yes  If so, are there any without handrails? Yes  Home free of loose throw rugs in walkways, pet beds, electrical cords, etc? Yes  Adequate lighting in your home to reduce risk of falls? Yes  ASSISTIVE DEVICES UTILIZED TO PREVENT FALLS:  Life alert? No  Use of a cane, walker or w/c? No  Grab bars in the bathroom? No  Shower chair or bench in shower? No  Elevated toilet seat or a handicapped toilet? Yes   TIMED UP AND  GO:  Was the test performed? No .  Tele-Health Visit     Cognitive Function:  Normal cognitive status assessed by direct observation by this Nurse Health Advisor. No abnormalities found.          Immunizations Immunization History  Administered Date(s) Administered   Fluad Quad(high Dose 65+) 12/31/2018, 01/20/2020   Influenza Split 01/13/2011   Influenza, High Dose Seasonal PF 01/01/2018   Influenza,inj,Quad PF,6+ Mos 02/21/2013, 03/10/2014, 02/16/2017   Moderna Sars-Covid-2 Vaccination 06/12/2019, 07/03/2019   Pneumococcal Conjugate-13 02/16/2017   Pneumococcal Polysaccharide-23 12/31/2018   Tdap 11/22/2012    TDAP status: Up to date  Flu Vaccine status: Up to date  Pneumococcal vaccine status: Up to date  Covid-19 vaccine status: Completed vaccines  Qualifies for Shingles Vaccine? Yes   Zostavax completed No   Shingrix Completed?: No.    Education has been provided regarding the importance of this vaccine. Patient has been advised to call insurance company to determine out of pocket expense if they have not yet received this vaccine. Advised may also receive vaccine at local pharmacy or Health Dept. Verbalized acceptance and understanding.  Screening Tests Health Maintenance  Topic Date Due   Zoster Vaccines- Shingrix (1 of 2) Never done   COVID-19 Vaccine (3 - Booster) 12/03/2019   OPHTHALMOLOGY EXAM  08/08/2020   FOOT EXAM  05/18/2021 (Originally 03/12/2020)   INFLUENZA VACCINE  11/09/2020   HEMOGLOBIN A1C  02/17/2021   TETANUS/TDAP  11/23/2022   Fecal DNA (Cologuard)  02/05/2023   Hepatitis C Screening  Completed   PNA vac Low Risk Adult  Completed   HPV VACCINES  Aged Out    Health Maintenance  Health Maintenance Due  Topic Date Due   Zoster Vaccines- Shingrix (1 of 2) Never done   COVID-19 Vaccine (3 - Booster) 12/03/2019   OPHTHALMOLOGY EXAM  08/08/2020   Colonoscopy completed 03-31-2021   Lung Cancer Screening: (Low Dose CT Chest recommended  if Age 18-80 years, 30 pack-year currently smoking OR have quit w/in 15years.) does qualify.   Lung Cancer Screening Referral:   Additional Screening:  Hepatitis C Screening: does not qualify; Completed 12-21-2017  Vision Screening: Recommended annual ophthalmology exams for early detection of glaucoma and other disorders of the eye. Is the patient up to date with their annual eye exam?  Yes  Who is the provider or what is the name of the office in which the patient attends annual eye exams?  [facility] If pt is not established with a provider, would they like to be referred to a provider to establish care? No .   Dental Screening: Recommended annual dental exams for proper oral hygiene  Community Resource Referral / Chronic Care Management: CRR required this visit?  No   CCM required this visit?  No      Plan:     I have personally reviewed and noted the following in the patient's chart:   Medical and social history Use of alcohol, tobacco or illicit drugs  Current medications and supplements including opioid prescriptions. Patient is not currently taking opioid prescriptions. Functional ability and status Nutritional status Physical activity Advanced directives List of other physicians Hospitalizations, surgeries, and ER visits in  previous 12 months Vitals Screenings to include cognitive, depression, and falls Referrals and appointments  In addition, I have reviewed and discussed with patient certain preventive protocols, quality metrics, and best practice recommendations. A written personalized care plan for preventive services as well as general preventive health recommendations were provided to patient.     Leroy Kennedy, LPN   04/24/6429   Nurse Notes:   NA

## 2020-10-19 NOTE — Patient Instructions (Addendum)
Andrew Tanner , Thank you for taking time to come for your Medicare Wellness Visit. I appreciate your ongoing commitment to your health goals. Please review the following plan we discussed and let me know if I can assist you in the future.   Screening recommendations/referrals: Colonoscopy: completed Recommended yearly ophthalmology/optometry visit for glaucoma screening and checkup Recommended yearly dental visit for hygiene and checkup  Vaccinations: Influenza vaccine:  Up to Date Pneumococcal vaccine: Up to Date Tdap vaccine: Up to Date Shingles vaccine: Education provided    Advanced directives: copy requested  Conditions/risks identified: NA  Next appointment: 11-19-2020 @ 8:40 Dr Carlota Raspberry                7--17-2023 @ 9:00 am  Annual Wellness  Preventive Care 33 Years and Older, Male Preventive care refers to lifestyle choices and visits with your health care provider that can promote health and wellness. What does preventive care include? A yearly physical exam. This is also called an annual well check. Dental exams once or twice a year. Routine eye exams. Ask your health care provider how often you should have your eyes checked. Personal lifestyle choices, including: Daily care of your teeth and gums. Regular physical activity. Eating a healthy diet. Avoiding tobacco and drug use. Limiting alcohol use. Practicing safe sex. Taking low doses of aspirin every day. Taking vitamin and mineral supplements as recommended by your health care provider. What happens during an annual well check? The services and screenings done by your health care provider during your annual well check will depend on your age, overall health, lifestyle risk factors, and family history of disease. Counseling  Your health care provider may ask you questions about your: Alcohol use. Tobacco use. Drug use. Emotional well-being. Home and relationship well-being. Sexual activity. Eating  habits. History of falls. Memory and ability to understand (cognition). Work and work Statistician. Screening  You may have the following tests or measurements: Height, weight, and BMI. Blood pressure. Lipid and cholesterol levels. These may be checked every 5 years, or more frequently if you are over 103 years old. Skin check. Lung cancer screening. You may have this screening every year starting at age 22 if you have a 30-pack-year history of smoking and currently smoke or have quit within the past 15 years. Fecal occult blood test (FOBT) of the stool. You may have this test every year starting at age 34. Flexible sigmoidoscopy or colonoscopy. You may have a sigmoidoscopy every 5 years or a colonoscopy every 10 years starting at age 76. Prostate cancer screening. Recommendations will vary depending on your family history and other risks. Hepatitis C blood test. Hepatitis B blood test. Sexually transmitted disease (STD) testing. Diabetes screening. This is done by checking your blood sugar (glucose) after you have not eaten for a while (fasting). You may have this done every 1-3 years. Abdominal aortic aneurysm (AAA) screening. You may need this if you are a current or former smoker. Osteoporosis. You may be screened starting at age 62 if you are at high risk. Talk with your health care provider about your test results, treatment options, and if necessary, the need for more tests. Vaccines  Your health care provider may recommend certain vaccines, such as: Influenza vaccine. This is recommended every year. Tetanus, diphtheria, and acellular pertussis (Tdap, Td) vaccine. You may need a Td booster every 10 years. Zoster vaccine. You may need this after age 50. Pneumococcal 13-valent conjugate (PCV13) vaccine. One dose is recommended after age 60.  Pneumococcal polysaccharide (PPSV23) vaccine. One dose is recommended after age 45. Talk to your health care provider about which screenings and  vaccines you need and how often you need them. This information is not intended to replace advice given to you by your health care provider. Make sure you discuss any questions you have with your health care provider. Document Released: 04/24/2015 Document Revised: 12/16/2015 Document Reviewed: 01/27/2015 Elsevier Interactive Patient Education  2017 [place] Prevention in the Home Falls can cause injuries. They can happen to people of all ages. There are many things you can do to make your home safe and to help prevent falls. What can I do on the outside of my home? Regularly fix the edges of walkways and driveways and fix any cracks. Remove anything that might make you trip as you walk through a door, such as a raised step or threshold. Trim any bushes or trees on the path to your home. Use bright outdoor lighting. Clear any walking paths of anything that might make someone trip, such as rocks or tools. Regularly check to see if handrails are loose or broken. Make sure that both sides of any steps have handrails. Any raised decks and porches should have guardrails on the edges. Have any leaves, snow, or ice cleared regularly. Use sand or salt on walking paths during winter. Clean up any spills in your garage right away. This includes oil or grease spills. What can I do in the bathroom? Use night lights. Install grab bars by the toilet and in the tub and shower. Do not use towel bars as grab bars. Use non-skid mats or decals in the tub or shower. If you need to sit down in the shower, use a plastic, non-slip stool. Keep the floor dry. Clean up any water that spills on the floor as soon as it happens. Remove soap buildup in the tub or shower regularly. Attach bath mats securely with double-sided non-slip rug tape. Do not have throw rugs and other things on the floor that can make you trip. What can I do in the bedroom? Use night lights. Make sure that you have a light by your  bed that is easy to reach. Do not use any sheets or blankets that are too big for your bed. They should not hang down onto the floor. Have a firm chair that has side arms. You can use this for support while you get dressed. Do not have throw rugs and other things on the floor that can make you trip. What can I do in the kitchen? Clean up any spills right away. Avoid walking on wet floors. Keep items that you use a lot in easy-to-reach places. If you need to reach something above you, use a strong step stool that has a grab bar. Keep electrical cords out of the way. Do not use floor polish or wax that makes floors slippery. If you must use wax, use non-skid floor wax. Do not have throw rugs and other things on the floor that can make you trip. What can I do with my stairs? Do not leave any items on the stairs. Make sure that there are handrails on both sides of the stairs and use them. Fix handrails that are broken or loose. Make sure that handrails are as long as the stairways. Check any carpeting to make sure that it is firmly attached to the stairs. Fix any carpet that is loose or worn. Avoid having throw rugs at the  top or bottom of the stairs. If you do have throw rugs, attach them to the floor with carpet tape. Make sure that you have a light switch at the top of the stairs and the bottom of the stairs. If you do not have them, ask someone to add them for you. What else can I do to help prevent falls? Wear shoes that: Do not have high heels. Have rubber bottoms. Are comfortable and fit you well. Are closed at the toe. Do not wear sandals. If you use a stepladder: Make sure that it is fully opened. Do not climb a closed stepladder. Make sure that both sides of the stepladder are locked into place. Ask someone to hold it for you, if possible. Clearly mark and make sure that you can see: Any grab bars or handrails. First and last steps. Where the edge of each step is. Use tools that  help you move around (mobility aids) if they are needed. These include: Canes. Walkers. Scooters. Crutches. Turn on the lights when you go into a dark area. Replace any light bulbs as soon as they burn out. Set up your furniture so you have a clear path. Avoid moving your furniture around. If any of your floors are uneven, fix them. If there are any pets around you, be aware of where they are. Review your medicines with your doctor. Some medicines can make you feel dizzy. This can increase your chance of falling. Ask your doctor what other things that you can do to help prevent falls. This information is not intended to replace advice given to you by your health care provider. Make sure you discuss any questions you have with your health care provider. Document Released: 01/22/2009 Document Revised: 09/03/2015 Document Reviewed: 05/02/2014 Elsevier Interactive Patient Education  2017 [employer].

## 2020-10-20 DIAGNOSIS — M542 Cervicalgia: Secondary | ICD-10-CM | POA: Diagnosis not present

## 2020-10-20 DIAGNOSIS — M6281 Muscle weakness (generalized): Secondary | ICD-10-CM | POA: Diagnosis not present

## 2020-10-20 DIAGNOSIS — M5412 Radiculopathy, cervical region: Secondary | ICD-10-CM | POA: Diagnosis not present

## 2020-10-22 DIAGNOSIS — N3010 Interstitial cystitis (chronic) without hematuria: Secondary | ICD-10-CM | POA: Diagnosis not present

## 2020-10-22 DIAGNOSIS — M542 Cervicalgia: Secondary | ICD-10-CM | POA: Diagnosis not present

## 2020-10-22 DIAGNOSIS — M62838 Other muscle spasm: Secondary | ICD-10-CM | POA: Diagnosis not present

## 2020-10-22 DIAGNOSIS — M6281 Muscle weakness (generalized): Secondary | ICD-10-CM | POA: Diagnosis not present

## 2020-10-22 DIAGNOSIS — R102 Pelvic and perineal pain: Secondary | ICD-10-CM | POA: Diagnosis not present

## 2020-10-26 ENCOUNTER — Other Ambulatory Visit: Payer: Self-pay | Admitting: Family Medicine

## 2020-10-27 DIAGNOSIS — M6281 Muscle weakness (generalized): Secondary | ICD-10-CM | POA: Diagnosis not present

## 2020-10-27 DIAGNOSIS — M542 Cervicalgia: Secondary | ICD-10-CM | POA: Diagnosis not present

## 2020-10-30 DIAGNOSIS — M542 Cervicalgia: Secondary | ICD-10-CM | POA: Diagnosis not present

## 2020-10-30 DIAGNOSIS — M6281 Muscle weakness (generalized): Secondary | ICD-10-CM | POA: Diagnosis not present

## 2020-11-02 DIAGNOSIS — F3341 Major depressive disorder, recurrent, in partial remission: Secondary | ICD-10-CM | POA: Diagnosis not present

## 2020-11-03 DIAGNOSIS — M542 Cervicalgia: Secondary | ICD-10-CM | POA: Diagnosis not present

## 2020-11-03 DIAGNOSIS — M6281 Muscle weakness (generalized): Secondary | ICD-10-CM | POA: Diagnosis not present

## 2020-11-05 DIAGNOSIS — M6281 Muscle weakness (generalized): Secondary | ICD-10-CM | POA: Diagnosis not present

## 2020-11-05 DIAGNOSIS — M542 Cervicalgia: Secondary | ICD-10-CM | POA: Diagnosis not present

## 2020-11-09 ENCOUNTER — Encounter: Payer: Self-pay | Admitting: Family Medicine

## 2020-11-10 DIAGNOSIS — N3010 Interstitial cystitis (chronic) without hematuria: Secondary | ICD-10-CM | POA: Diagnosis not present

## 2020-11-10 DIAGNOSIS — R300 Dysuria: Secondary | ICD-10-CM | POA: Diagnosis not present

## 2020-11-10 DIAGNOSIS — R102 Pelvic and perineal pain: Secondary | ICD-10-CM | POA: Diagnosis not present

## 2020-11-10 DIAGNOSIS — R3915 Urgency of urination: Secondary | ICD-10-CM | POA: Diagnosis not present

## 2020-11-10 DIAGNOSIS — N401 Enlarged prostate with lower urinary tract symptoms: Secondary | ICD-10-CM | POA: Diagnosis not present

## 2020-11-11 ENCOUNTER — Ambulatory Visit (INDEPENDENT_AMBULATORY_CARE_PROVIDER_SITE_OTHER): Payer: Medicare Other | Admitting: Family Medicine

## 2020-11-11 ENCOUNTER — Encounter: Payer: Self-pay | Admitting: Family Medicine

## 2020-11-11 ENCOUNTER — Other Ambulatory Visit: Payer: Self-pay

## 2020-11-11 VITALS — BP 124/64 | HR 76 | Temp 98.2°F | Resp 17 | Ht 71.0 in | Wt 184.2 lb

## 2020-11-11 DIAGNOSIS — M542 Cervicalgia: Secondary | ICD-10-CM | POA: Diagnosis not present

## 2020-11-11 DIAGNOSIS — E162 Hypoglycemia, unspecified: Secondary | ICD-10-CM

## 2020-11-11 DIAGNOSIS — E1165 Type 2 diabetes mellitus with hyperglycemia: Secondary | ICD-10-CM | POA: Diagnosis not present

## 2020-11-11 DIAGNOSIS — M6281 Muscle weakness (generalized): Secondary | ICD-10-CM | POA: Diagnosis not present

## 2020-11-11 NOTE — Progress Notes (Signed)
Subjective:  Patient ID: Andrew Tanner, male    DOB: 07-Jun-1950  Age: 70 y.o. MRN: [id number]  CC:  Chief Complaint  Patient presents with   Hypoglycemia    Hypoglycemia attack Sunday, reports sweating with some weakness, took glucose tablets and recovered about 20 minutes    HPI Andrew Tanner presents for   Hypoglycemia: See recent MyChart message.  Has had low readings from 58-98.  Has been undergoing more physical therapy with stretching and walking,  some at home and with [facility] - more active, possibly affecting his sugar.  He has tried to increase his calorie levels.   Most recently discussed his diabetes in May, A1c 6.7, controlled at that time.  Treated with metformin 500 mg 3 times daily, Jardiance 25 mg daily, glipizide  20 mg twice daily.  We did discuss 2 episodes of hypoglycemia at that time.  Option to decrease glipizide at that time if recurrent, but thought to be due to intake. Most recent episode 3 days ago - felt disoriented, blurry vision, sweating -did not check blood sugar. Drank gatorade, 2 glucose tabs and toast - felt better. Had eaten normally.  Feels better with exercise and PT.  Recent home readings: FAsting: 101 - 142 (prior to exercise) Postprandial 59-154  Lab Results  Component Value Date   HGBA1C 6.7 (H) 08/17/2020     History Patient Active Problem List   Diagnosis Date Noted   Positive colorectal cancer screening using Cologuard test 03/16/2020   Chronic RLQ pain 03/16/2020   Diabetes mellitus type II 06/02/2011   Hypertension 06/02/2011   Hyperlipemia 06/02/2011   Cataract 06/02/2011   Past Medical History:  Diagnosis Date   Anxiety    Cancer ([place])    basal cell skin CA   Cataract    Depression    Diabetes mellitus without complication ([place])    Hyperlipidemia    Hypertension    IC (interstitial cystitis)    Past Surgical History:  Procedure Laterality Date   CATARACT EXTRACTION, BILATERAL     HERNIA REPAIR      NECK SURGERY     SPINE SURGERY     TRANSURETHRAL RESECTION OF PROSTATE     TURP VAPORIZATION     No Known Allergies Prior to Admission medications   Medication Sig Start Date End Date Taking? Authorizing Provider  alfuzosin (UROXATRAL) 10 MG 24 hr tablet Take 1 tablet by mouth once daily with breakfast 10/26/20  Yes [name], MD  atorvastatin (LIPITOR) 20 MG tablet TAKE 1 TABLET BY MOUTH ONCE DAILY AT  6  PM 02/10/20  Yes [name], MD  clonazePAM (KLONOPIN) 0.5 MG tablet Take 0.5 mg by mouth 3 (three) times daily as needed.   Yes [provider]  diclofenac (VOLTAREN) 75 MG EC tablet Take 1 tablet (75 mg total) by mouth 2 (two) times daily as needed. 07/20/20  Yes Maximiano Coss, NP  escitalopram (LEXAPRO) 10 MG tablet Take 10 mg by mouth daily. 01/22/20  Yes [provider]  FLUoxetine (PROZAC) 40 MG capsule Take 40 mg by mouth daily. Take one table daily   Yes [provider]  gabapentin (NEURONTIN) 300 MG capsule Take 1 capsule (300 mg total) by mouth 2 (two) times daily as needed. 08/17/20  Yes [name], MD  glipiZIDE (GLUCOTROL) 10 MG tablet TAKE 2 TABLETS BY MOUTH TWICE DAILY BEFORE A MEAL 08/24/20  Yes Maximiano Coss, NP  glucose blood test strip 1 each by Other  route daily. Use as instructed checking once per day, or if new symptoms. 05/18/20  Yes [name], MD  hydrocortisone 2.5 % ointment APPLY TO AFFECTED AREA EVERYDAY AT BEDTIME 08/19/19  Yes Lavonna Monarch, MD  ibuprofen (ADVIL,MOTRIN) 800 MG tablet Take 1 tablet (800 mg total) by mouth every 8 (eight) hours as needed for pain. 01/09/12  Yes Shawnee Knapp, MD  JARDIANCE 25 MG TABS tablet TAKE 1 TABLET BY MOUTH ONCE DAILY BEFORE BREAKFAST 09/23/20  Yes [name], MD  lisinopril-hydrochlorothiazide (ZESTORETIC) 20-25 MG tablet Take 1 tablet by mouth once daily 09/23/20  Yes [name], MD  metFORMIN (GLUCOPHAGE) 500 MG tablet TAKE 1 TABLET BY MOUTH THREE TIMES DAILY  10/14/20  Yes [name], MD  methocarbamol (ROBAXIN) 500 MG tablet Take 1 tablet (500 mg total) by mouth 2 (two) times daily as needed for muscle spasms. 07/20/20  Yes Maximiano Coss, NP  Misc Natural Products (PUMPKIN SEED OIL PO) Take by mouth.   Yes [provider]  MYRBETRIQ 50 MG TB24 tablet Take 50 mg by mouth daily. 01/16/20  Yes [provider]  Turmeric (QC TUMERIC COMPLEX PO) Take by mouth.   Yes [provider]   Social History   Socioeconomic History   Marital status: Married    Spouse name: [name]   Number of children: Not on file   Years of education: Not on file   Highest education level: Not on file  Occupational History   Not on file  Tobacco Use   Smoking status: Former   Smokeless tobacco: Never   Tobacco comments:    quit 28 yrs ago  Vaping Use   Vaping Use: Never used  Substance and Sexual Activity   Alcohol use: Yes    Comment: 1 glass wine per week   Drug use: No   Sexual activity: Never  Other Topics Concern   Not on file  Social History Narrative   Not on file   Social Determinants of Health   Financial Resource Strain: Low Risk    Difficulty of Paying Living Expenses: Not hard at all  Food Insecurity: No Food Insecurity   Worried About Charity fundraiser in the Last Year: Never true   [place] in the Last Year: Never true  Transportation Needs: No Transportation Needs   Lack of Transportation (Medical): No   Lack of Transportation (Non-Medical): No  Physical Activity: Sufficiently Active   Days of Exercise per Week: 7 days   Minutes of Exercise per Session: 50 min  Stress: No Stress Concern Present   Feeling of Stress : Not at all  Social Connections: Socially Integrated   Frequency of Communication with Friends and Family: More than three times a week   Frequency of Social Gatherings with Friends and Family: More than three times a week   Attends Religious Services: More than 4 times per year    Active Member of [employer] or Organizations: Yes   Attends Archivist Meetings: 1 to 4 times per year   Marital Status: Married  Human resources officer Violence: Not At Risk   Fear of Current or Ex-Partner: No   Emotionally Abused: No   Physically Abused: No   Sexually Abused: No    Review of Systems  Per HPI.  Objective:   Vitals:   11/11/20 1101  BP: 124/64  Pulse: 76  Resp: 17  Temp: 98.2 F (36.8 C)  TempSrc: Temporal  SpO2:  98%  Weight: 184 lb 3.2 oz (83.6 kg)  Height: 5' 11"  (1.803 m)     Physical Exam Vitals reviewed.  Constitutional:      General: He is not in acute distress.    Appearance: Normal appearance. He is well-developed. He is not ill-appearing, toxic-appearing or diaphoretic.  HENT:     Head: Normocephalic and atraumatic.  Neck:     Vascular: No carotid bruit or JVD.  Cardiovascular:     Rate and Rhythm: Normal rate and regular rhythm.     Heart sounds: Normal heart sounds. No murmur heard. Pulmonary:     Effort: Pulmonary effort is normal.     Breath sounds: Normal breath sounds. No rales.  Musculoskeletal:     Right lower leg: No edema.     Left lower leg: No edema.  Skin:    General: Skin is warm and dry.  Neurological:     Mental Status: He is alert and oriented to person, place, and time.  Psychiatric:        Mood and Affect: Mood normal.       Assessment & Plan:  Yanni Quiroa is a 70 y.o. male . Type 2 diabetes mellitus with hyperglycemia, without long-term current use of insulin (HCC)  Hypoglycemia  Increased physical activity, denies missed meals. Well controlled on last A1c. Decrease glipizide by half, then off altogether if persistent hypoglycemia. Handout given. Treatment options if recurrent hypoglycemia. RTC precautions. Follow up planned next week.   No orders of the defined types were placed in this encounter.  Patient Instructions  Decrease glipizide to 1 twice per day. If continued low readings on 1 pill  twice per day, then stop glipizide. Continue regular meals.   Give me an update in Mychart in next 2 weeks. Return to the clinic or go to the nearest emergency room if any of your symptoms worsen or new symptoms occur.  Recheck next week as planned for diabetes.    Hypoglycemia Hypoglycemia occurs when the level of sugar (glucose) in the blood is too low. Hypoglycemia can happen in people who have or do not have diabetes. It can develop quickly, and it can be a medical emergency. For most people, a blood glucose level below 70 mg/dL (3.9 mmol/L) is consideredhypoglycemia. Glucose is a type of sugar that provides the body's main source of energy. Certain hormones (insulin and glucagon) control the level of glucose in the blood. Insulin lowers blood glucose, and glucagon raises blood glucose. Hypoglycemia can result from having too much insulin in the bloodstream, or from not eating enough food that contains glucose. You may also have reactive hypoglycemia, which happens within 4 hoursafter eating a meal. What are the causes? Hypoglycemia occurs most often in people who have diabetes and may be caused by: Diabetes medicine. Not eating enough, or not eating often enough. Increased physical activity. Drinking alcohol on an empty stomach. If you do not have diabetes, hypoglycemia may be caused by: A tumor in the pancreas. Not eating enough, or not eating for long periods at a time (fasting). A severe infection or illness. Problems after having bariatric surgery. Organ failure, such as kidney or liver failure. Certain medicines. What increases the risk? Hypoglycemia is more likely to develop in people who: Have diabetes and take medicines to lower blood glucose. Abuse alcohol. Have a severe illness. What are the signs or symptoms? Symptoms vary depending on whether the condition is mild, moderate, or severe. Mild hypoglycemia Hunger. Sweating and feeling clammy.  Dizziness or feeling  light-headed. Sleepiness or restless sleep. Nausea. Increased heart rate. Headache. Blurry vision. Mood changes, such as irritability or anxiety. Tingling or numbness around the mouth, lips, or tongue. Moderate hypoglycemia Confusion and poor judgment. Behavior changes. Weakness. Irregular heartbeat. A change in coordination. Severe hypoglycemia Severe hypoglycemia is a medical emergency. It can cause: Fainting. Seizures. Loss of consciousness (coma). Death. How is this diagnosed? Hypoglycemia is diagnosed with a blood test to measure your blood glucose level. This blood test is done while you are having symptoms. Your health careprovider may also do a physical exam and review your medical history. How is this treated? This condition can be treated by immediately eating or drinking something that contains sugar with 15 grams of fast-acting carbohydrate, such as: 4 oz (120 mL) of fruit juice. 4 oz (120 mL) of regular soda (not diet soda). Several pieces of hard candy. Check food labels to find out how many pieces to eat for 15 grams. 1 Tbsp (15 mL) of sugar or honey. 4 glucose tablets. 1 tube of glucose gel. Treating hypoglycemia if you have diabetes If you are alert and able to swallow safely, follow the 15:15 rule: Take 15 grams of a fast-acting carbohydrate. Talk with your health care provider about how much you should take. Options for getting 15 grams of fast-acting carbohydrate include: Glucose tablets (take 4 tablets). Several pieces of hard candy. Check food labels to find out how many pieces to eat for 15 grams. 4 oz (120 mL) of fruit juice. 4 oz (120 mL) of regular soda (not diet soda). 1 Tbsp (15 mL) of sugar or honey. 1 tube of glucose gel. Check your blood glucose 15 minutes after you take the carbohydrate. If the repeat blood glucose level is still at or below 70 mg/dL (3.9 mmol/L), take 15 grams of a carbohydrate again. If your blood glucose level does not  increase above 70 mg/dL (3.9 mmol/L) after 3 tries, seek emergency medical care. After your blood glucose level returns to normal, eat a meal or a snack within 1 hour.  Treating severe hypoglycemia Severe hypoglycemia is when your blood glucose level is below 54 mg/dL (3 mmol/L). Severe hypoglycemia is a medical emergency. Get medical help right away. If you have severe hypoglycemia and you cannot eat or drink, you will need to be given glucagon. A family member or close friend should learn how to check your blood glucose and how to give you glucagon. Ask your health care providerif you need to have an emergency glucagon kit available. Severe hypoglycemia may need to be treated in a hospital. The treatment may include getting glucose through an IV. You may also need treatment for thecause of your hypoglycemia. Follow these instructions at home:  General instructions Take over-the-counter and prescription medicines only as told by your health care provider. Monitor your blood glucose as told by your health care provider. If you drink alcohol: Limit how much you have to: 0-1 drink a day for women who are not pregnant. 0-2 drinks a day for men. Know how much alcohol is in your drink. In the [place] one drink equals one 12 oz bottle of beer (355 mL), one 5 oz glass of wine (148 mL), or one 1 oz glass of hard liquor (44 mL). Be sure to eat food along with drinking alcohol. Be aware that alcohol is absorbed quickly and may have lingering effects that may result in hypoglycemia later. Be sure to do ongoing glucose monitoring. Keep all  follow-up visits. This is important. If you have diabetes: Always have a fast-acting carbohydrate (15 grams) option with you to treat low blood glucose. Follow your diabetes management plan as directed by your health care provider. Make sure you: Know the symptoms of hypoglycemia. It is important to treat it right away to prevent it from becoming severe. Check your  blood glucose as often as told. Always check before and after exercise. Always check your blood glucose before you drive a motorized vehicle. Take your medicines as told. Follow your meal plan. Eat on time, and do not skip meals. Share your diabetes management plan with people in your workplace, school, and household. Carry a medical alert card or wear medical alert jewelry. Where to find more information American Diabetes Association: www.diabetes.org Contact a health care provider if: You have problems keeping your blood glucose in your target range. You have frequent episodes of hypoglycemia. Get help right away if: You continue to have hypoglycemia symptoms after eating or drinking something that contains 15 grams of fast-acting carbohydrate, and you cannot get your blood glucose above 70 mg/dL (3.9 mmol/L) while following the 15:15 rule. Your blood glucose is below 54 mg/dL (3 mmol/L). You have a seizure. You faint. These symptoms may represent a serious problem that is an emergency. Do not wait to see if the symptoms will go away. Get medical help right away. Call your local emergency services (911 in the [place] Do not drive yourself to the hospital. Summary Hypoglycemia occurs when the level of sugar (glucose) in the blood is too low. Hypoglycemia can happen in people who have or do not have diabetes. It can develop quickly, and it can be a medical emergency. Make sure you know the symptoms of hypoglycemia and how to treat it. Always have a fast-acting carbohydrate option with you to treat low blood sugar. This information is not intended to replace advice given to you by your health care provider. Make sure you discuss any questions you have with your healthcare provider. Document Revised: 02/27/2020 Document Reviewed: 02/27/2020 Elsevier Patient Education  2022 [place],   [name], MD [place], [place]  [facility] 11/11/20 12:56 PM

## 2020-11-11 NOTE — Patient Instructions (Addendum)
Decrease glipizide to 1 twice per day. If continued low readings on 1 pill twice per day, then stop glipizide. Continue regular meals.   Give me an update in Mychart in next 2 weeks. Return to the clinic or go to the nearest emergency room if any of your symptoms worsen or new symptoms occur.  Recheck next week as planned for diabetes.    Hypoglycemia Hypoglycemia occurs when the level of sugar (glucose) in the blood is too low. Hypoglycemia can happen in people who have or do not have diabetes. It can develop quickly, and it can be a medical emergency. For most people, a blood glucose level below 70 mg/dL (3.9 mmol/L) is consideredhypoglycemia. Glucose is a type of sugar that provides the body's main source of energy. Certain hormones (insulin and glucagon) control the level of glucose in the blood. Insulin lowers blood glucose, and glucagon raises blood glucose. Hypoglycemia can result from having too much insulin in the bloodstream, or from not eating enough food that contains glucose. You may also have reactive hypoglycemia, which happens within 4 hoursafter eating a meal. What are the causes? Hypoglycemia occurs most often in people who have diabetes and may be caused by: Diabetes medicine. Not eating enough, or not eating often enough. Increased physical activity. Drinking alcohol on an empty stomach. If you do not have diabetes, hypoglycemia may be caused by: A tumor in the pancreas. Not eating enough, or not eating for long periods at a time (fasting). A severe infection or illness. Problems after having bariatric surgery. Organ failure, such as kidney or liver failure. Certain medicines. What increases the risk? Hypoglycemia is more likely to develop in people who: Have diabetes and take medicines to lower blood glucose. Abuse alcohol. Have a severe illness. What are the signs or symptoms? Symptoms vary depending on whether the condition is mild, moderate, or severe. Mild  hypoglycemia Hunger. Sweating and feeling clammy. Dizziness or feeling light-headed. Sleepiness or restless sleep. Nausea. Increased heart rate. Headache. Blurry vision. Mood changes, such as irritability or anxiety. Tingling or numbness around the mouth, lips, or tongue. Moderate hypoglycemia Confusion and poor judgment. Behavior changes. Weakness. Irregular heartbeat. A change in coordination. Severe hypoglycemia Severe hypoglycemia is a medical emergency. It can cause: Fainting. Seizures. Loss of consciousness (coma). Death. How is this diagnosed? Hypoglycemia is diagnosed with a blood test to measure your blood glucose level. This blood test is done while you are having symptoms. Your health careprovider may also do a physical exam and review your medical history. How is this treated? This condition can be treated by immediately eating or drinking something that contains sugar with 15 grams of fast-acting carbohydrate, such as: 4 oz (120 mL) of fruit juice. 4 oz (120 mL) of regular soda (not diet soda). Several pieces of hard candy. Check food labels to find out how many pieces to eat for 15 grams. 1 Tbsp (15 mL) of sugar or honey. 4 glucose tablets. 1 tube of glucose gel. Treating hypoglycemia if you have diabetes If you are alert and able to swallow safely, follow the 15:15 rule: Take 15 grams of a fast-acting carbohydrate. Talk with your health care provider about how much you should take. Options for getting 15 grams of fast-acting carbohydrate include: Glucose tablets (take 4 tablets). Several pieces of hard candy. Check food labels to find out how many pieces to eat for 15 grams. 4 oz (120 mL) of fruit juice. 4 oz (120 mL) of regular soda (not diet soda).  1 Tbsp (15 mL) of sugar or honey. 1 tube of glucose gel. Check your blood glucose 15 minutes after you take the carbohydrate. If the repeat blood glucose level is still at or below 70 mg/dL (3.9 mmol/L), take 15  grams of a carbohydrate again. If your blood glucose level does not increase above 70 mg/dL (3.9 mmol/L) after 3 tries, seek emergency medical care. After your blood glucose level returns to normal, eat a meal or a snack within 1 hour.  Treating severe hypoglycemia Severe hypoglycemia is when your blood glucose level is below 54 mg/dL (3 mmol/L). Severe hypoglycemia is a medical emergency. Get medical help right away. If you have severe hypoglycemia and you cannot eat or drink, you will need to be given glucagon. A family member or close friend should learn how to check your blood glucose and how to give you glucagon. Ask your health care providerif you need to have an emergency glucagon kit available. Severe hypoglycemia may need to be treated in a hospital. The treatment may include getting glucose through an IV. You may also need treatment for thecause of your hypoglycemia. Follow these instructions at home:  General instructions Take over-the-counter and prescription medicines only as told by your health care provider. Monitor your blood glucose as told by your health care provider. If you drink alcohol: Limit how much you have to: 0-1 drink a day for women who are not pregnant. 0-2 drinks a day for men. Know how much alcohol is in your drink. In the [place] one drink equals one 12 oz bottle of beer (355 mL), one 5 oz glass of wine (148 mL), or one 1 oz glass of hard liquor (44 mL). Be sure to eat food along with drinking alcohol. Be aware that alcohol is absorbed quickly and may have lingering effects that may result in hypoglycemia later. Be sure to do ongoing glucose monitoring. Keep all follow-up visits. This is important. If you have diabetes: Always have a fast-acting carbohydrate (15 grams) option with you to treat low blood glucose. Follow your diabetes management plan as directed by your health care provider. Make sure you: Know the symptoms of hypoglycemia. It is important to  treat it right away to prevent it from becoming severe. Check your blood glucose as often as told. Always check before and after exercise. Always check your blood glucose before you drive a motorized vehicle. Take your medicines as told. Follow your meal plan. Eat on time, and do not skip meals. Share your diabetes management plan with people in your workplace, school, and household. Carry a medical alert card or wear medical alert jewelry. Where to find more information American Diabetes Association: www.diabetes.org Contact a health care provider if: You have problems keeping your blood glucose in your target range. You have frequent episodes of hypoglycemia. Get help right away if: You continue to have hypoglycemia symptoms after eating or drinking something that contains 15 grams of fast-acting carbohydrate, and you cannot get your blood glucose above 70 mg/dL (3.9 mmol/L) while following the 15:15 rule. Your blood glucose is below 54 mg/dL (3 mmol/L). You have a seizure. You faint. These symptoms may represent a serious problem that is an emergency. Do not wait to see if the symptoms will go away. Get medical help right away. Call your local emergency services (911 in the [place] Do not drive yourself to the hospital. Summary Hypoglycemia occurs when the level of sugar (glucose) in the blood is too low. Hypoglycemia can happen  in people who have or do not have diabetes. It can develop quickly, and it can be a medical emergency. Make sure you know the symptoms of hypoglycemia and how to treat it. Always have a fast-acting carbohydrate option with you to treat low blood sugar. This information is not intended to replace advice given to you by your health care provider. Make sure you discuss any questions you have with your healthcare provider. Document Revised: 02/27/2020 Document Reviewed: 02/27/2020 Elsevier Patient Education  2022 [employer].

## 2020-11-13 DIAGNOSIS — M6281 Muscle weakness (generalized): Secondary | ICD-10-CM | POA: Diagnosis not present

## 2020-11-13 DIAGNOSIS — M542 Cervicalgia: Secondary | ICD-10-CM | POA: Diagnosis not present

## 2020-11-17 ENCOUNTER — Encounter: Payer: Self-pay | Admitting: Family Medicine

## 2020-11-19 ENCOUNTER — Encounter: Payer: Self-pay | Admitting: Family Medicine

## 2020-11-19 ENCOUNTER — Other Ambulatory Visit: Payer: Self-pay

## 2020-11-19 ENCOUNTER — Telehealth: Payer: Self-pay

## 2020-11-19 ENCOUNTER — Ambulatory Visit: Payer: Medicare Other

## 2020-11-19 ENCOUNTER — Ambulatory Visit (INDEPENDENT_AMBULATORY_CARE_PROVIDER_SITE_OTHER): Payer: Medicare Other | Admitting: Family Medicine

## 2020-11-19 VITALS — BP 128/74 | HR 90 | Temp 98.2°F | Resp 17 | Ht 71.0 in | Wt 181.4 lb

## 2020-11-19 DIAGNOSIS — E785 Hyperlipidemia, unspecified: Secondary | ICD-10-CM

## 2020-11-19 DIAGNOSIS — E1165 Type 2 diabetes mellitus with hyperglycemia: Secondary | ICD-10-CM

## 2020-11-19 DIAGNOSIS — I10 Essential (primary) hypertension: Secondary | ICD-10-CM

## 2020-11-19 DIAGNOSIS — E11649 Type 2 diabetes mellitus with hypoglycemia without coma: Secondary | ICD-10-CM

## 2020-11-19 LAB — GLUCOSE, POCT (MANUAL RESULT ENTRY): POC Glucose: 338 mg/dl — AB (ref 70–99)

## 2020-11-19 MED ORDER — EMPAGLIFLOZIN 25 MG PO TABS: 25.0000 mg | ORAL_TABLET | Freq: Every day | ORAL | 1 refills | Status: DC

## 2020-11-19 MED ORDER — ATORVASTATIN CALCIUM 20 MG PO TABS: ORAL_TABLET | ORAL | 3 refills | Status: DC

## 2020-11-19 MED ORDER — LISINOPRIL-HYDROCHLOROTHIAZIDE 20-25 MG PO TABS: 1.0000 | ORAL_TABLET | Freq: Every day | ORAL | 1 refills | Status: DC

## 2020-11-19 MED ORDER — METFORMIN HCL 500 MG PO TABS: 500.0000 mg | ORAL_TABLET | Freq: Three times a day (TID) | ORAL | 1 refills | Status: DC

## 2020-11-19 NOTE — Telephone Encounter (Signed)
Patient came in to the office for an A1C/bs level check. I checked his A1C which was a 6.3 and he used his own meter and his bs level was 177. Patient stated that this morning his bs was 307 because he took 6 antihistamines.

## 2020-11-19 NOTE — Progress Notes (Signed)
Subjective:  Patient ID: Andrew Tanner, male    DOB: October 01, 1950  Age: 70 y.o. MRN: [id number]  CC:  Chief Complaint  Patient presents with   Diabetes    Pt here today for 1 week recheck on hypoglycemic episodes, one again Saturday down to 30, pt reports sent a list of BG numbers via My Chart     HPI Andrew Tanner presents for   Diabetes follow-up.  History of type 2 diabetes with hyperglycemia, last visit August 3.  Had been experiencing hypoglycemia with readings as low as 58.  He had been undergoing more physical therapy, stretching, walking at home and with PT.  At that time he was on metformin 500 graphed grams 3 times daily, Jardiance 25 mg daily, glipizide 20 mg twice daily.  Decreased his glipizide to 10 mg twice daily.  See my chart messages, most recently still had a few low readings and discussed daily dosing of glipizide or complete cessation.  Last low of 47 about 5 -6 days ago. Highest 149-150.  Now on glipizide '10mg'$  BID - breakfast and lunch.  No missed doses. No skipped meals.  Reading 117 between meals yesterday.  Ate BF this am and took meds.  No new side effects with meds.   Lab Results  Component Value Date   HGBA1C 6.7 (H) 08/17/2020    History Patient Active Problem List   Diagnosis Date Noted   Positive colorectal cancer screening using Cologuard test 03/16/2020   Chronic RLQ pain 03/16/2020   Diabetes mellitus type II 06/02/2011   Hypertension 06/02/2011   Hyperlipemia 06/02/2011   Cataract 06/02/2011   Past Medical History:  Diagnosis Date   Anxiety    Cancer ([place])    basal cell skin CA   Cataract    Depression    Diabetes mellitus without complication ([place])    Hyperlipidemia    Hypertension    IC (interstitial cystitis)    Past Surgical History:  Procedure Laterality Date   CATARACT EXTRACTION, BILATERAL     HERNIA REPAIR     NECK SURGERY     SPINE SURGERY     TRANSURETHRAL RESECTION OF PROSTATE     TURP VAPORIZATION      No Known Allergies Prior to Admission medications   Medication Sig Start Date End Date Taking? Authorizing Provider  alfuzosin (UROXATRAL) 10 MG 24 hr tablet Take 1 tablet by mouth once daily with breakfast 10/26/20  Yes [name], MD  atorvastatin (LIPITOR) 20 MG tablet TAKE 1 TABLET BY MOUTH ONCE DAILY AT  6  PM 02/10/20  Yes [name], MD  clonazePAM (KLONOPIN) 0.5 MG tablet Take 0.5 mg by mouth 3 (three) times daily as needed.   Yes [provider]  diclofenac (VOLTAREN) 75 MG EC tablet Take 1 tablet (75 mg total) by mouth 2 (two) times daily as needed. 07/20/20  Yes Maximiano Coss, NP  escitalopram (LEXAPRO) 10 MG tablet Take 10 mg by mouth daily. 01/22/20  Yes [provider]  FLUoxetine (PROZAC) 40 MG capsule Take 40 mg by mouth daily. Take one table daily   Yes [provider]  gabapentin (NEURONTIN) 300 MG capsule Take 1 capsule (300 mg total) by mouth 2 (two) times daily as needed. 08/17/20  Yes [name], MD  glipiZIDE (GLUCOTROL) 10 MG tablet TAKE 2 TABLETS BY MOUTH TWICE DAILY BEFORE A MEAL 08/24/20  Yes Maximiano Coss, NP  glucose blood test strip 1 each by Other route daily. Use  as instructed checking once per day, or if new symptoms. 05/18/20  Yes [name], MD  hydrocortisone 2.5 % ointment APPLY TO AFFECTED AREA EVERYDAY AT BEDTIME 08/19/19  Yes Lavonna Monarch, MD  ibuprofen (ADVIL,MOTRIN) 800 MG tablet Take 1 tablet (800 mg total) by mouth every 8 (eight) hours as needed for pain. 01/09/12  Yes Shawnee Knapp, MD  JARDIANCE 25 MG TABS tablet TAKE 1 TABLET BY MOUTH ONCE DAILY BEFORE BREAKFAST 09/23/20  Yes [name], MD  lisinopril-hydrochlorothiazide (ZESTORETIC) 20-25 MG tablet Take 1 tablet by mouth once daily 09/23/20  Yes [name], MD  metFORMIN (GLUCOPHAGE) 500 MG tablet TAKE 1 TABLET BY MOUTH THREE TIMES DAILY 10/14/20  Yes [name], MD  methocarbamol (ROBAXIN) 500 MG tablet Take 1 tablet (500 mg  total) by mouth 2 (two) times daily as needed for muscle spasms. 07/20/20  Yes Maximiano Coss, NP  Misc Natural Products (PUMPKIN SEED OIL PO) Take by mouth.   Yes [provider]  Turmeric (QC TUMERIC COMPLEX PO) Take by mouth.   Yes [provider]  MYRBETRIQ 50 MG TB24 tablet Take 50 mg by mouth daily. 01/16/20   [provider]   Social History   Socioeconomic History   Marital status: Married    Spouse name: Andrew Tanner   Number of children: Not on file   Years of education: Not on file   Highest education level: Not on file  Occupational History   Not on file  Tobacco Use   Smoking status: Former   Smokeless tobacco: Never   Tobacco comments:    quit 28 yrs ago  Vaping Use   Vaping Use: Never used  Substance and Sexual Activity   Alcohol use: Yes    Comment: 1 glass wine per week   Drug use: No   Sexual activity: Never  Other Topics Concern   Not on file  Social History Narrative   Not on file   Social Determinants of Health   Financial Resource Strain: Low Risk    Difficulty of Paying Living Expenses: Not hard at all  Food Insecurity: No Food Insecurity   Worried About Charity fundraiser in the Last Year: Never true   [place] in the Last Year: Never true  Transportation Needs: No Transportation Needs   Lack of Transportation (Medical): No   Lack of Transportation (Non-Medical): No  Physical Activity: Sufficiently Active   Days of Exercise per Week: 7 days   Minutes of Exercise per Session: 50 min  Stress: No Stress Concern Present   Feeling of Stress : Not at all  Social Connections: Socially Integrated   Frequency of Communication with Friends and Family: More than three times a week   Frequency of Social Gatherings with Friends and Family: More than three times a week   Attends Religious Services: More than 4 times per year   Active Member of [employer] or Organizations: Yes   Attends Archivist Meetings: 1 to 4  times per year   Marital Status: Married  Human resources officer Violence: Not At Risk   Fear of Current or Ex-Partner: No   Emotionally Abused: No   Physically Abused: No   Sexually Abused: No    Review of Systems  Constitutional:  Negative for fatigue and unexpected weight change.  Eyes:  Negative for visual disturbance.  Respiratory:  Negative for cough, chest tightness and shortness of breath.   Cardiovascular:  Negative for  chest pain, palpitations and leg swelling.  Gastrointestinal:  Negative for abdominal pain and blood in stool.  Neurological:  Negative for dizziness, light-headedness and headaches.    Objective:   Vitals:   11/19/20 0846  BP: 128/74  Pulse: 90  Resp: 17  Temp: 98.2 F (36.8 C)  TempSrc: Temporal  SpO2: 98%  Weight: 181 lb 6.4 oz (82.3 kg)  Height: '5\' 11"'$  (1.803 m)     Physical Exam Vitals reviewed.  Constitutional:      Appearance: He is well-developed.  HENT:     Head: Normocephalic and atraumatic.  Neck:     Vascular: No carotid bruit or JVD.  Cardiovascular:     Rate and Rhythm: Normal rate and regular rhythm.     Heart sounds: Normal heart sounds. No murmur heard. Pulmonary:     Effort: Pulmonary effort is normal.     Breath sounds: Normal breath sounds. No rales.  Musculoskeletal:     Right lower leg: No edema.     Left lower leg: No edema.  Skin:    General: Skin is warm and dry.  Neurological:     Mental Status: He is alert and oriented to person, place, and time.  Psychiatric:        Mood and Affect: Mood normal.   Initial poct glucose 349.   Results for orders placed or performed in visit on 11/19/20  POCT glucose (manual entry)  Result Value Ref Range   POC Glucose 338 (A) 70 - 99 mg/dl     Assessment & Plan:  Blakley Laverde is a 70 y.o. male . Type 2 diabetes mellitus with hypoglycemia without coma, without long-term current use of insulin (HCC) - Plan: Hemoglobin A1c, POCT glucose (manual entry)  -Prior  hypoglycemia, adjusted glipizide to half dosing, now at 10 mg twice daily.  Still reported some low blood sugars last week, otherwise controlled this week.   -Inconsistent reading compared to in office test today in the 300s.  Question accuracy of home meter.  Nurse visit today with his meter to determine accuracy.  Did advise him to check his blood sugar when he gets home from today's visit as well.  Further changes to be determined by meter comparison at nurse visit  Hyperlipidemia, unspecified hyperlipidemia type - Plan: atorvastatin (LIPITOR) 20 MG tablet Essential hypertension - Plan: lisinopril-hydrochlorothiazide (ZESTORETIC) 20-25 MG tablet  -Tolerating other meds, continue same doses, labs planned in 3 months.  Meds ordered this encounter  Medications   atorvastatin (LIPITOR) 20 MG tablet    Sig: TAKE 1 TABLET BY MOUTH ONCE DAILY AT  6  PM    Dispense:  90 tablet    Refill:  3   empagliflozin (JARDIANCE) 25 MG TABS tablet    Sig: Take 1 tablet (25 mg total) by mouth daily before breakfast.    Dispense:  90 tablet    Refill:  1   lisinopril-hydrochlorothiazide (ZESTORETIC) 20-25 MG tablet    Sig: Take 1 tablet by mouth daily.    Dispense:  90 tablet    Refill:  1   metFORMIN (GLUCOPHAGE) 500 MG tablet    Sig: Take 1 tablet (500 mg total) by mouth 3 (three) times daily.    Dispense:  270 tablet    Refill:  1   Patient Instructions  Please return with your meter for a nurse visit this afternoon as may not be obtaining accurate readings.      Signed,   [name], MD [place]  [facility] 11/19/20 9:20 AM

## 2020-11-19 NOTE — Progress Notes (Signed)
Patient presents to the office to have his blood sugar rechecked. His A1C was checked by finger stick which read 6.3. Patient checked his bs with his meter and it was 177. Patient stated that his bs was 307 this morning and he feels that it was because he took 6 antihistamine tablets.

## 2020-11-19 NOTE — Patient Instructions (Signed)
Please return with your meter for a nurse visit this afternoon as may not be obtaining accurate readings.

## 2020-11-24 DIAGNOSIS — N3010 Interstitial cystitis (chronic) without hematuria: Secondary | ICD-10-CM | POA: Diagnosis not present

## 2020-11-24 DIAGNOSIS — R102 Pelvic and perineal pain: Secondary | ICD-10-CM | POA: Diagnosis not present

## 2020-11-24 DIAGNOSIS — M62838 Other muscle spasm: Secondary | ICD-10-CM | POA: Diagnosis not present

## 2020-12-21 ENCOUNTER — Other Ambulatory Visit: Payer: Self-pay

## 2020-12-21 ENCOUNTER — Encounter: Payer: Self-pay | Admitting: Family Medicine

## 2020-12-21 ENCOUNTER — Ambulatory Visit (INDEPENDENT_AMBULATORY_CARE_PROVIDER_SITE_OTHER): Payer: Medicare Other | Admitting: Family Medicine

## 2020-12-21 VITALS — BP 128/76 | HR 93 | Temp 98.2°F | Resp 16 | Ht 71.0 in | Wt 177.4 lb

## 2020-12-21 DIAGNOSIS — E1165 Type 2 diabetes mellitus with hyperglycemia: Secondary | ICD-10-CM | POA: Diagnosis not present

## 2020-12-21 DIAGNOSIS — E162 Hypoglycemia, unspecified: Secondary | ICD-10-CM | POA: Diagnosis not present

## 2020-12-21 DIAGNOSIS — M546 Pain in thoracic spine: Secondary | ICD-10-CM

## 2020-12-21 DIAGNOSIS — Z23 Encounter for immunization: Secondary | ICD-10-CM | POA: Diagnosis not present

## 2020-12-21 DIAGNOSIS — G8929 Other chronic pain: Secondary | ICD-10-CM

## 2020-12-21 LAB — GLUCOSE, POCT (MANUAL RESULT ENTRY): POC Glucose: 250 mg/dl — AB (ref 70–99)

## 2020-12-21 LAB — POCT GLYCOSYLATED HEMOGLOBIN (HGB A1C): Hemoglobin A1C: 6.8 % — AB (ref 4.0–5.6)

## 2020-12-21 NOTE — Progress Notes (Signed)
Subjective:  Patient ID: Andrew Tanner, male    DOB: 04-Nov-1950  Age: 70 y.o. MRN: [id number]  CC:  Chief Complaint  Patient presents with   Diabetes    Pt here today for recheck  has brought his meter as requested, pt meter reads: 217 office meter reads: 250 notes he did have a meal less than 1 hour ago.    Immunizations    Pt agreed to high dose flu     HPI Andrew Tanner presents for   Diabetes: With prior hypoglycemia, last A1c in May controlled at 6.7.  Hypoglycemic episodes as well as 58, had been undergoing more physical therapy and activity with home PT.  Metformin 500 mg 3 times daily, Jardiance 25 mg daily, glipizide was decreased from 20 mg twice daily to 10 mg twice daily.  Still some low readings at his August 11 visit, including down to the 40 range approximately 5 days previously.  However an office glucose was 349, 338.  Did have some inconsistent readings compared to in office testing in the 300s.  There was some question of accuracy of his home meter.  Nurse visit with his  meter was 177,   he attributed hyperglycemia to antihistamine tablets taken that day.  No changes to regimen, RTC precautions if over 250. Still on '10mg'$  BID glipizide, jardiance '25mg'$  QD, metformin '500mg'$  tid.  No missed doses.  3 meals per day (8:30a, 12:30-2, 6-7pm). no snacks.  Some smaller meals and cautious with eating due to interstitial cystitis.  Off rehab last week, exercising daily 28mn.  No overnight lows.   Home readings: Fasting:105-143 Postprandial (usually 3-3.5 hrs after meals):60 (did not feel well - took few glucose tablets, 3 episodes of taking glucose tabs since last visit). Up to 139 postprandial.  No 200's usually.  Last ate 1 hr ago. CBG here 217   Microalbumin: Normal ratio in 2020 Optho, foot exam, pneumovax:  Ophthalmology exam: due - he agrees to schedule appt.  Foot exam: today  Results for orders placed or performed in visit on 12/21/20  POCT glucose (manual  entry)  Result Value Ref Range   POC Glucose 250 (A) 70 - 99 mg/dl  POCT glycosylated hemoglobin (Hb A1C)  Result Value Ref Range   Hemoglobin A1C 6.8 (A) 4.0 - 5.6 %   HbA1c POC (<> result, manual entry)     HbA1c, POC (prediabetic range)     HbA1c, POC (controlled diabetic range)     Lab Results  Component Value Date   HGBA1C 6.8 (A) 12/21/2020   HGBA1C 6.7 (H) 08/17/2020   HGBA1C 6.7 (H) 05/18/2020   Lab Results  Component Value Date   MICROALBUR 3.8 (H) 03/10/2014   LDLCALC 92 08/17/2020   CREATININE 1.01 08/17/2020   Back pain: Treated by Dr. DLynann Bologna s/p PT, and cortisone injection -minimal relief.  Still pain in midback. Same pain/area. No leg radiation.  Would like neurosurgery eval for second opinion.  Cervical laminoplasty 12-153yrago - prior surgeon moved/retired.     History Patient Active Problem List   Diagnosis Date Noted   Positive colorectal cancer screening using Cologuard test 03/16/2020   Chronic RLQ pain 03/16/2020   Diabetes mellitus type II 06/02/2011   Hypertension 06/02/2011   Hyperlipemia 06/02/2011   Cataract 06/02/2011   Past Medical History:  Diagnosis Date   Anxiety    [place]   basal cell skin CA   Cataract    Depression    Diabetes  mellitus without complication (HCC)    Hyperlipidemia    Hypertension    IC (interstitial cystitis)    Past Surgical History:  Procedure Laterality Date   CATARACT EXTRACTION, BILATERAL     HERNIA REPAIR     NECK SURGERY     SPINE SURGERY     TRANSURETHRAL RESECTION OF PROSTATE     TURP VAPORIZATION     No Known Allergies Prior to Admission medications   Medication Sig Start Date End Date Taking? Authorizing Provider  alfuzosin (UROXATRAL) 10 MG 24 hr tablet Take 1 tablet by mouth once daily with breakfast 10/26/20  Yes [name], MD  atorvastatin (LIPITOR) 20 MG tablet TAKE 1 TABLET BY MOUTH ONCE DAILY AT  6  PM 11/19/20  Yes [name], MD  clonazePAM (KLONOPIN) 0.5 MG  tablet Take 0.5 mg by mouth 3 (three) times daily as needed.   Yes [provider]  diclofenac (VOLTAREN) 75 MG EC tablet Take 1 tablet (75 mg total) by mouth 2 (two) times daily as needed. 07/20/20  Yes Maximiano Coss, NP  empagliflozin (JARDIANCE) 25 MG TABS tablet Take 1 tablet (25 mg total) by mouth daily before breakfast. 11/19/20  Yes [name], MD  escitalopram (LEXAPRO) 10 MG tablet Take 10 mg by mouth daily. 01/22/20  Yes [provider]  FLUoxetine (PROZAC) 40 MG capsule Take 40 mg by mouth daily. Take one table daily   Yes [provider]  gabapentin (NEURONTIN) 300 MG capsule Take 1 capsule (300 mg total) by mouth 2 (two) times daily as needed. 08/17/20  Yes [name], MD  glipiZIDE (GLUCOTROL) 10 MG tablet TAKE 2 TABLETS BY MOUTH TWICE DAILY BEFORE A MEAL 08/24/20  Yes Maximiano Coss, NP  glucose blood test strip 1 each by Other route daily. Use as instructed checking once per day, or if new symptoms. 05/18/20  Yes [name], MD  hydrocortisone 2.5 % ointment APPLY TO AFFECTED AREA EVERYDAY AT BEDTIME 08/19/19  Yes Lavonna Monarch, MD  ibuprofen (ADVIL,MOTRIN) 800 MG tablet Take 1 tablet (800 mg total) by mouth every 8 (eight) hours as needed for pain. 01/09/12  Yes Shawnee Knapp, MD  lisinopril-hydrochlorothiazide (ZESTORETIC) 20-25 MG tablet Take 1 tablet by mouth daily. 11/19/20  Yes [name], MD  metFORMIN (GLUCOPHAGE) 500 MG tablet Take 1 tablet (500 mg total) by mouth 3 (three) times daily. 11/19/20  Yes [name], MD  methocarbamol (ROBAXIN) 500 MG tablet Take 1 tablet (500 mg total) by mouth 2 (two) times daily as needed for muscle spasms. 07/20/20  Yes Maximiano Coss, NP  Misc Natural Products (PUMPKIN SEED OIL PO) Take by mouth.   Yes [provider]  Turmeric (QC TUMERIC COMPLEX PO) Take by mouth.   Yes [provider]  hydrOXYzine (ATARAX/VISTARIL) 50 MG tablet Take 50 mg by mouth at bedtime. 12/16/20    [provider]  MYRBETRIQ 50 MG TB24 tablet Take 50 mg by mouth daily. 01/16/20   [provider]   Social History   Socioeconomic History   Marital status: Married    Spouse name: [name]   Number of children: Not on file   Years of education: Not on file   Highest education level: Not on file  Occupational History   Not on file  Tobacco Use   Smoking status: Former   Smokeless tobacco: Never   Tobacco comments:    quit 28 yrs ago  Vaping Use  Vaping Use: Never used  Substance and Sexual Activity   Alcohol use: Yes    Comment: 1 glass wine per week   Drug use: No   Sexual activity: Never  Other Topics Concern   Not on file  Social History Narrative   Not on file   Social Determinants of Health   Financial Resource Strain: Low Risk    Difficulty of Paying Living Expenses: Not hard at all  Food Insecurity: No Food Insecurity   Worried About Charity fundraiser in the Last Year: Never true   [place] in the Last Year: Never true  Transportation Needs: No Transportation Needs   Lack of Transportation (Medical): No   Lack of Transportation (Non-Medical): No  Physical Activity: Sufficiently Active   Days of Exercise per Week: 7 days   Minutes of Exercise per Session: 50 min  Stress: No Stress Concern Present   Feeling of Stress : Not at all  Social Connections: Socially Integrated   Frequency of Communication with Friends and Family: More than three times a week   Frequency of Social Gatherings with Friends and Family: More than three times a week   Attends Religious Services: More than 4 times per year   Active Member of [employer] or Organizations: Yes   Attends Archivist Meetings: 1 to 4 times per year   Marital Status: Married  Human resources officer Violence: Not At Risk   Fear of Current or Ex-Partner: No   Emotionally Abused: No   Physically Abused: No   Sexually Abused: No    Review of Systems Per HPI.   Objective:    Vitals:   12/21/20 1519  BP: 128/76  Pulse: 93  Resp: 16  Temp: 98.2 F (36.8 C)  TempSrc: Temporal  SpO2: 97%  Weight: 177 lb 6.4 oz (80.5 kg)  Height: '5\' 11"'$  (1.803 m)     Physical Exam Vitals reviewed.  Constitutional:      Appearance: He is well-developed.  HENT:     Head: Normocephalic and atraumatic.  Neck:     Vascular: No carotid bruit or JVD.  Cardiovascular:     Rate and Rhythm: Normal rate and regular rhythm.     Heart sounds: Normal heart sounds. No murmur heard. Pulmonary:     Effort: Pulmonary effort is normal.     Breath sounds: Normal breath sounds. No rales.  Musculoskeletal:       Back:     Right lower leg: No edema.     Left lower leg: No edema.  Skin:    General: Skin is warm and dry.  Neurological:     Mental Status: He is alert and oriented to person, place, and time.  Psychiatric:        Mood and Affect: Mood normal.     Results for orders placed or performed in visit on 12/21/20  POCT glucose (manual entry)  Result Value Ref Range   POC Glucose 250 (A) 70 - 99 mg/dl  POCT glycosylated hemoglobin (Hb A1C)  Result Value Ref Range   Hemoglobin A1C 6.8 (A) 4.0 - 5.6 %   HbA1c POC (<> result, manual entry)     HbA1c, POC (prediabetic range)     HbA1c, POC (controlled diabetic range)       Assessment & Plan:  Raynaldo Wo is a 70 y.o. male . Type 2 diabetes mellitus with hyperglycemia, without long-term current use of insulin ([place]) - Plan: POCT glucose (manual entry), POCT  glycosylated hemoglobin (Hb A1C) Hypoglycemia  -Overall stable A1c.  I suspect he is having some relative drops in between meals.  We will have him add small healthy snack in between breakfast and lunch, and again between lunch and dinner.  No nocturnal symptoms.  Continue same regimen of metformin, Jardiance, glipizide same dose for now with recheck in 6 weeks.  Need for influenza vaccination - Plan: Flu Vaccine QUAD High Dose(Fluad)  Chronic midline  thoracic back pain - Plan: Ambulatory referral to Neurosurgery  -As above status post PT, injection, persistent pain and would like second opinion, referral placed to neurosurgery.   No orders of the defined types were placed in this encounter.  Patient Instructions  Continue same medications for now, but try to add small healthy snack 2 to 3 hours after meals as that appears to be the time your blood sugar is dropping.  Overall 3 months level has been well controlled.  Recheck in 6 weeks, sooner if return of low blood sugar symptoms or persistent readings in the 200s.  Let me know if there are questions.  I will place a referral to neurosurgery for second opinion on your back pain.    Signed,   [name], MD [place] 12/21/20 5:41 PM

## 2020-12-21 NOTE — Patient Instructions (Signed)
Continue same medications for now, but try to add small healthy snack 2 to 3 hours after meals as that appears to be the time your blood sugar is dropping.  Overall 3 months level has been well controlled.  Recheck in 6 weeks, sooner if return of low blood sugar symptoms or persistent readings in the 200s.  Let me know if there are questions.  I will place a referral to neurosurgery for second opinion on your back pain.

## 2020-12-22 ENCOUNTER — Encounter: Payer: Self-pay | Admitting: Family Medicine

## 2021-01-01 DIAGNOSIS — F3341 Major depressive disorder, recurrent, in partial remission: Secondary | ICD-10-CM | POA: Diagnosis not present

## 2021-02-01 ENCOUNTER — Encounter: Payer: Self-pay | Admitting: Family Medicine

## 2021-02-01 ENCOUNTER — Other Ambulatory Visit: Payer: Self-pay

## 2021-02-01 ENCOUNTER — Ambulatory Visit (INDEPENDENT_AMBULATORY_CARE_PROVIDER_SITE_OTHER): Payer: Medicare Other | Admitting: Family Medicine

## 2021-02-01 VITALS — BP 118/60 | HR 87 | Temp 98.1°F | Resp 15 | Ht 71.0 in | Wt 185.0 lb

## 2021-02-01 DIAGNOSIS — F3341 Major depressive disorder, recurrent, in partial remission: Secondary | ICD-10-CM | POA: Diagnosis not present

## 2021-02-01 DIAGNOSIS — R55 Syncope and collapse: Secondary | ICD-10-CM

## 2021-02-01 DIAGNOSIS — E1165 Type 2 diabetes mellitus with hyperglycemia: Secondary | ICD-10-CM

## 2021-02-01 NOTE — Patient Instructions (Signed)
Continue to have healthy snacks between meals as needed.  Continue to drink sufficient fluids, get up slowly from a seated position or lying down as that was likely the cause of your recent episode.  If any new or worsening lightheadedness or dizziness, or those episodes occur more frequently we should adjust your medications.  Let me know.

## 2021-02-01 NOTE — Progress Notes (Signed)
Subjective:  Patient ID: Andrew Tanner, male    DOB: [birth year]  Age: 70 y.o. MRN: [id number]  CC:  Chief Complaint  Patient presents with   Diabetes    Pt reports not as many lowes 01/09/21 had reading of 89 between meals with sxs at that time. Otherwise no concerns with levels since has added healthy snacks as recommended     HPI Andrew Tanner presents for   Diabetes: With history of hyperglycemia.  Overall stable based on last A1c but episodic lows. Last visit in September.  Discussed adding healthy snack 2 to 3 hours after meals as that appeared to be the time that he was having relative lows.  Continue same dose of metformin, Jardiance, glipizide. Since last visit less symptoms of low blood sugars. Highest 186.  Single low of 89. Felt a little low at that time. Usually 114-127 usually.   Syncope/fainting episode.  1.5 weeks ago. Waiting at house. Nothing to eat for about 5 hrs. Got up quickly from recliner. Walked 2 steps - pressure in head, feeling like he would pass and out then passed out. On way down to floor and passed out for a few seconds ,witnessed, no seizure activity. No preceding HA, weakness palpitations, or chest pain. No incontinence. No injury. Went to eat and fine since. No recurrence. Drinking fluids throughout the day.   BP Readings from Last 3 Encounters:  02/01/21 118/60  12/21/20 128/76  11/19/20 128/74   Lab Results  Component Value Date   WBC 7.9 02/19/2020   HGB 16.1 02/19/2020   HCT 49.2 02/19/2020   MCV 91 02/19/2020   PLT 213 02/19/2020       Lab Results  Component Value Date   HGBA1C 6.8 (A) 12/21/2020   HGBA1C 6.7 (H) 08/17/2020   HGBA1C 6.7 (H) 05/18/2020   Lab Results  Component Value Date   MICROALBUR 3.8 (H) 03/10/2014   LDLCALC 92 08/17/2020   CREATININE 1.01 08/17/2020      History Patient Active Problem List   Diagnosis Date Noted   Positive colorectal cancer screening using Cologuard test 03/16/2020   Chronic  RLQ pain 03/16/2020   Diabetes mellitus type II 06/02/2011   Hypertension 06/02/2011   Hyperlipemia 06/02/2011   Cataract 06/02/2011   Past Medical History:  Diagnosis Date   Anxiety    Cancer ([place])    basal cell skin CA   Cataract    Depression    Diabetes mellitus without complication (HCC)    Hyperlipidemia    Hypertension    IC (interstitial cystitis)    Past Surgical History:  Procedure Laterality Date   CATARACT EXTRACTION, BILATERAL     HERNIA REPAIR     NECK SURGERY     SPINE SURGERY     TRANSURETHRAL RESECTION OF PROSTATE     TURP VAPORIZATION     No Known Allergies Prior to Admission medications   Medication Sig Start Date End Date Taking? Authorizing Provider  alfuzosin (UROXATRAL) 10 MG 24 hr tablet Take 1 tablet by mouth once daily with breakfast 10/26/20  Yes [name], MD  atorvastatin (LIPITOR) 20 MG tablet TAKE 1 TABLET BY MOUTH ONCE DAILY AT  6  PM 11/19/20  Yes [name], MD  clonazePAM (KLONOPIN) 0.5 MG tablet Take 0.5 mg by mouth 3 (three) times daily as needed.   Yes [provider]  diclofenac (VOLTAREN) 75 MG EC tablet Take 1 tablet (75 mg total) by mouth 2 (two) times daily  as needed. 07/20/20  Yes Maximiano Coss, NP  empagliflozin (JARDIANCE) 25 MG TABS tablet Take 1 tablet (25 mg total) by mouth daily before breakfast. 11/19/20  Yes [name], MD  escitalopram (LEXAPRO) 10 MG tablet Take 10 mg by mouth daily. 01/22/20  Yes [provider]  FLUoxetine (PROZAC) 40 MG capsule Take 40 mg by mouth daily. Take one table daily   Yes [provider]  gabapentin (NEURONTIN) 300 MG capsule Take 1 capsule (300 mg total) by mouth 2 (two) times daily as needed. 08/17/20  Yes [name], MD  glipiZIDE (GLUCOTROL) 10 MG tablet TAKE 2 TABLETS BY MOUTH TWICE DAILY BEFORE A MEAL 08/24/20  Yes Maximiano Coss, NP  glucose blood test strip 1 each by Other route daily. Use as instructed checking once per day, or if new  symptoms. 05/18/20  Yes [name], MD  hydrocortisone 2.5 % ointment APPLY TO AFFECTED AREA EVERYDAY AT BEDTIME 08/19/19  Yes Lavonna Monarch, MD  hydrOXYzine (ATARAX/VISTARIL) 50 MG tablet Take 50 mg by mouth at bedtime. 12/16/20  Yes [provider]  ibuprofen (ADVIL,MOTRIN) 800 MG tablet Take 1 tablet (800 mg total) by mouth every 8 (eight) hours as needed for pain. 01/09/12  Yes Shawnee Knapp, MD  lisinopril-hydrochlorothiazide (ZESTORETIC) 20-25 MG tablet Take 1 tablet by mouth daily. 11/19/20  Yes [name], MD  metFORMIN (GLUCOPHAGE) 500 MG tablet Take 1 tablet (500 mg total) by mouth 3 (three) times daily. 11/19/20  Yes [name], MD  methocarbamol (ROBAXIN) 500 MG tablet Take 1 tablet (500 mg total) by mouth 2 (two) times daily as needed for muscle spasms. 07/20/20  Yes Maximiano Coss, NP  Misc Natural Products (PUMPKIN SEED OIL PO) Take by mouth.   Yes [provider]  Turmeric (QC TUMERIC COMPLEX PO) Take by mouth.   Yes [provider]  MYRBETRIQ 50 MG TB24 tablet Take 50 mg by mouth daily. 01/16/20   [provider]   Social History   Socioeconomic History   Marital status: Married    Spouse name: [name]   Number of children: Not on file   Years of education: Not on file   Highest education level: Not on file  Occupational History   Not on file  Tobacco Use   Smoking status: Former   Smokeless tobacco: Never   Tobacco comments:    quit 28 yrs ago  Vaping Use   Vaping Use: Never used  Substance and Sexual Activity   Alcohol use: Yes    Comment: 1 glass wine per week   Drug use: No   Sexual activity: Never  Other Topics Concern   Not on file  Social History Narrative   Not on file   Social Determinants of Health   Financial Resource Strain: Low Risk    Difficulty of Paying Living Expenses: Not hard at all  Food Insecurity: No Food Insecurity   Worried About Charity fundraiser in the Last Year: Never true    [place] in the Last Year: Never true  Transportation Needs: No Transportation Needs   Lack of Transportation (Medical): No   Lack of Transportation (Non-Medical): No  Physical Activity: Sufficiently Active   Days of Exercise per Week: 7 days   Minutes of Exercise per Session: 50 min  Stress: No Stress Concern Present   Feeling of Stress : Not at all  Social Connections: Socially Integrated   Frequency of Communication with  Friends and Family: More than three times a week   Frequency of Social Gatherings with Friends and Family: More than three times a week   Attends Religious Services: More than 4 times per year   Active Member of [employer] or Organizations: Yes   Attends Archivist Meetings: 1 to 4 times per year   Marital Status: Married  Human resources officer Violence: Not At Risk   Fear of Current or Ex-Partner: No   Emotionally Abused: No   Physically Abused: No   Sexually Abused: No    Review of Systems Per HPI   Objective:   Vitals:   02/01/21 1413  BP: 118/60  Pulse: 87  Resp: 15  Temp: 98.1 F (36.7 C)  TempSrc: Temporal  SpO2: 97%  Weight: 185 lb (83.9 kg)  Height: 5\' 11"  (1.803 m)     Physical Exam Vitals reviewed.  Constitutional:      Appearance: He is well-developed.  HENT:     Head: Normocephalic and atraumatic.  Eyes:     Extraocular Movements: Extraocular movements intact.     Comments: No apparent nystagmus  Neck:     Vascular: No carotid bruit or JVD.  Cardiovascular:     Rate and Rhythm: Normal rate and regular rhythm.     Heart sounds: Normal heart sounds. No murmur heard. Pulmonary:     Effort: Pulmonary effort is normal.     Breath sounds: Normal breath sounds. No rales.  Musculoskeletal:     Right lower leg: No edema.     Left lower leg: No edema.  Skin:    General: Skin is warm and dry.  Neurological:     Mental Status: He is alert and oriented to person, place, and time.     Comments: No focal weakness.  tremor with  finger-to-nose but no past-pointing.  No pronator drift.  Equal grip strength, lower extremity strength.  Psychiatric:        Mood and Affect: Mood normal.    Orthostatic VS for the past 72 hrs (Last 3 readings):  Orthostatic BP Patient Position Orthostatic Pulse  02/01/21 1506 116/74 Standing 66  02/01/21 1505 120/70 Sitting 88  02/01/21 1504 118/70 Sitting 91      Assessment & Plan:  Andrew Tanner is a 70 y.o. male . Type 2 diabetes mellitus with hyperglycemia, without long-term current use of insulin (HCC)  -Improved stability with snacks as above, continue same med regimen.  Recheck A1c in 2 months.  Hypoglycemia precautions.  Syncope, unspecified syncope type - Plan: Orthostatic vital signs  -Likely vagal episode from standing up too quickly.  Nonfocal neurologic exam.  Overall reassuring orthostatics, with slight elevation in heart rate.  Continue maintenance of hydration, standing up slowly after seated position.  RTC precautions if symptoms recur as may need to adjust his medications.  No orders of the defined types were placed in this encounter.  Patient Instructions  Continue to have healthy snacks between meals as needed.  Continue to drink sufficient fluids, get up slowly from a seated position or lying down as that was likely the cause of your recent episode.  If any new or worsening lightheadedness or dizziness, or those episodes occur more frequently we should adjust your medications.  Let me know.     Signed,   [name], MD [place], [facility] 02/01/21 5:41 PM

## 2021-02-03 DIAGNOSIS — F3341 Major depressive disorder, recurrent, in partial remission: Secondary | ICD-10-CM | POA: Diagnosis not present

## 2021-02-05 DIAGNOSIS — H524 Presbyopia: Secondary | ICD-10-CM | POA: Diagnosis not present

## 2021-02-11 DIAGNOSIS — N401 Enlarged prostate with lower urinary tract symptoms: Secondary | ICD-10-CM | POA: Diagnosis not present

## 2021-02-11 DIAGNOSIS — R351 Nocturia: Secondary | ICD-10-CM | POA: Diagnosis not present

## 2021-02-11 DIAGNOSIS — N3010 Interstitial cystitis (chronic) without hematuria: Secondary | ICD-10-CM | POA: Diagnosis not present

## 2021-02-16 ENCOUNTER — Other Ambulatory Visit: Payer: Self-pay | Admitting: Family Medicine

## 2021-02-16 ENCOUNTER — Other Ambulatory Visit: Payer: Self-pay | Admitting: Registered Nurse

## 2021-02-16 DIAGNOSIS — E1165 Type 2 diabetes mellitus with hyperglycemia: Secondary | ICD-10-CM

## 2021-02-16 NOTE — Telephone Encounter (Signed)
Deferring to you 

## 2021-03-17 DIAGNOSIS — G959 Disease of spinal cord, unspecified: Secondary | ICD-10-CM | POA: Diagnosis not present

## 2021-03-17 DIAGNOSIS — Z6825 Body mass index (BMI) 25.0-25.9, adult: Secondary | ICD-10-CM | POA: Diagnosis not present

## 2021-03-17 DIAGNOSIS — I10 Essential (primary) hypertension: Secondary | ICD-10-CM | POA: Diagnosis not present

## 2021-03-31 ENCOUNTER — Ambulatory Visit (INDEPENDENT_AMBULATORY_CARE_PROVIDER_SITE_OTHER): Payer: Medicare Other | Admitting: Family Medicine

## 2021-03-31 ENCOUNTER — Encounter: Payer: Self-pay | Admitting: Family Medicine

## 2021-03-31 VITALS — BP 124/70 | HR 83 | Temp 98.3°F | Ht 71.0 in | Wt 184.8 lb

## 2021-03-31 DIAGNOSIS — E1165 Type 2 diabetes mellitus with hyperglycemia: Secondary | ICD-10-CM

## 2021-03-31 DIAGNOSIS — R209 Unspecified disturbances of skin sensation: Secondary | ICD-10-CM | POA: Diagnosis not present

## 2021-03-31 DIAGNOSIS — I10 Essential (primary) hypertension: Secondary | ICD-10-CM | POA: Diagnosis not present

## 2021-03-31 DIAGNOSIS — E785 Hyperlipidemia, unspecified: Secondary | ICD-10-CM

## 2021-03-31 MED ORDER — EMPAGLIFLOZIN 25 MG PO TABS: 25.0000 mg | ORAL_TABLET | Freq: Every day | ORAL | 1 refills | Status: DC

## 2021-03-31 MED ORDER — LISINOPRIL-HYDROCHLOROTHIAZIDE 20-25 MG PO TABS: 1.0000 | ORAL_TABLET | Freq: Every day | ORAL | 1 refills | Status: DC

## 2021-03-31 NOTE — Progress Notes (Signed)
Subjective:  Patient ID: Andrew Tanner, male    DOB: 04-14-1950  Age: 70 y.o. MRN: [id number]  CC:  Chief Complaint  Patient presents with   Follow-up    2 month follow up on diabetes, no concerns.     HPI Andrew Tanner presents for   Diabetes: With history of hyperglycemia.  Stable A1c is September.  Episodic lows previously.  Healthy snack between meals, continued on metformin, Jardiance, glipizide.  Improving at his October visit.  Syncopal episode discussed at that time, but had not eaten for about 5 hours.  Continue dietary advice with frequent snacks or meals, fluids throughout the day discussed. = snacks going well.  No symptomatic lows - lowest 72. No symptoms.  High 168. Average 127.  No new med side effects.  He is on statin and ACE inhibitor. Feet cold in winter.  No color changes. No numbness or tingling. Same every winter.prior gapapentin for back pain/nerve issues - did not tolerate - felt off.  Last microalbumin/creatinine ratio was normal 2 years ago. Normal sensation on home foot sensation test with [employer].    Saw Dr. Ellene Route- NS, recommended against surgery at this time. Happy with this news.    Lab Results  Component Value Date   HGBA1C 6.8 (A) 12/21/2020   HGBA1C 6.7 (H) 08/17/2020   HGBA1C 6.7 (H) 05/18/2020   Lab Results  Component Value Date   MICROALBUR 3.8 (H) 03/10/2014   [place] 92 08/17/2020   CREATININE 1.01 08/17/2020   Hyperlipidemia: Lipitor 20mg  qd, no new myalgias/side effects.  Lab Results  Component Value Date   CHOL 160 08/17/2020   HDL 42.70 08/17/2020   LDLCALC 92 08/17/2020   TRIG 125.0 08/17/2020   CHOLHDL 4 08/17/2020   Lab Results  Component Value Date   ALT 21 08/17/2020   AST 16 08/17/2020   ALKPHOS 62 08/17/2020   BILITOT 0.3 08/17/2020   Hypertension: Lisinopril hct.  On hydroxyzine for interstitial cystitis - followed by urology. Doing better.  Home readings: BP Readings from Last 3 Encounters:   03/31/21 124/70  02/01/21 118/60  12/21/20 128/76   Lab Results  Component Value Date   CREATININE 1.01 08/17/2020       History Patient Active Problem List   Diagnosis Date Noted   Positive colorectal cancer screening using Cologuard test 03/16/2020   Chronic RLQ pain 03/16/2020   Diabetes mellitus type II 06/02/2011   Hypertension 06/02/2011   Hyperlipemia 06/02/2011   Cataract 06/02/2011   Past Medical History:  Diagnosis Date   Anxiety    Cancer ([place])    basal cell skin CA   Cataract    Depression    Diabetes mellitus without complication ([place])    Hyperlipidemia    Hypertension    IC (interstitial cystitis)    Past Surgical History:  Procedure Laterality Date   CATARACT EXTRACTION, BILATERAL     HERNIA REPAIR     NECK SURGERY     SPINE SURGERY     TRANSURETHRAL RESECTION OF PROSTATE     TURP VAPORIZATION     No Known Allergies Prior to Admission medications   Medication Sig Start Date End Date Taking? Authorizing Provider  alfuzosin (UROXATRAL) 10 MG 24 hr tablet Take 1 tablet by mouth once daily with breakfast 02/16/21  Yes [name], MD  atorvastatin (LIPITOR) 20 MG tablet TAKE 1 TABLET BY MOUTH ONCE DAILY AT  6  PM 11/19/20  Yes [name], MD  clonazePAM [place])  0.5 MG tablet Take 0.5 mg by mouth 3 (three) times daily as needed.   Yes [provider]  empagliflozin (JARDIANCE) 25 MG TABS tablet Take 1 tablet (25 mg total) by mouth daily before breakfast. 11/19/20  Yes [name], MD  escitalopram (LEXAPRO) 10 MG tablet Take 10 mg by mouth daily. 01/22/20  Yes [provider]  FLUoxetine (PROZAC) 40 MG capsule Take 40 mg by mouth daily. Take one table daily   Yes [provider]  glipiZIDE (GLUCOTROL) 10 MG tablet TAKE 2 TABLETS BY MOUTH TWICE DAILY BEFORE A MEAL 02/16/21  Yes [name], MD  glucose blood test strip 1 each by Other route daily. Use as instructed checking once per day, or if new  symptoms. 05/18/20  Yes [name], MD  hydrOXYzine (ATARAX/VISTARIL) 50 MG tablet Take 50 mg by mouth at bedtime. 12/16/20  Yes [provider]  ibuprofen (ADVIL,MOTRIN) 800 MG tablet Take 1 tablet (800 mg total) by mouth every 8 (eight) hours as needed for pain. 01/09/12  Yes Shawnee Knapp, MD  lisinopril-hydrochlorothiazide (ZESTORETIC) 20-25 MG tablet Take 1 tablet by mouth daily. 11/19/20  Yes [name], MD  metFORMIN (GLUCOPHAGE) 500 MG tablet Take 1 tablet (500 mg total) by mouth 3 (three) times daily. 11/19/20  Yes [name], MD  methocarbamol (ROBAXIN) 500 MG tablet Take 1 tablet (500 mg total) by mouth 2 (two) times daily as needed for muscle spasms. 07/20/20  Yes Maximiano Coss, NP  diclofenac (VOLTAREN) 75 MG EC tablet Take 1 tablet (75 mg total) by mouth 2 (two) times daily as needed. Patient not taking: Reported on 03/31/2021 07/20/20   Maximiano Coss, NP  gabapentin (NEURONTIN) 300 MG capsule Take 1 capsule (300 mg total) by mouth 2 (two) times daily as needed. Patient not taking: Reported on 03/31/2021 08/17/20   [name], MD  hydrocortisone 2.5 % ointment APPLY TO AFFECTED AREA EVERYDAY AT BEDTIME Patient not taking: Reported on 03/31/2021 08/19/19   Lavonna Monarch, MD  Misc Natural Products (PUMPKIN SEED OIL PO) Take by mouth. Patient not taking: Reported on 03/31/2021    [provider]  Turmeric (QC TUMERIC COMPLEX PO) Take by mouth. Patient not taking: Reported on 03/31/2021    [provider]   Social History   Socioeconomic History   Marital status: Married    Spouse name: [name]   Number of children: Not on file   Years of education: Not on file   Highest education level: Not on file  Occupational History   Not on file  Tobacco Use   Smoking status: Former   Smokeless tobacco: Never   Tobacco comments:    quit 28 yrs ago  Vaping Use   Vaping Use: Never used  Substance and Sexual Activity   Alcohol use:  Yes    Comment: 1 glass wine per week   Drug use: No   Sexual activity: Never  Other Topics Concern   Not on file  Social History Narrative   Not on file   Social Determinants of Health   Financial Resource Strain: Low Risk    Difficulty of Paying Living Expenses: Not hard at all  Food Insecurity: No Food Insecurity   Worried About Charity fundraiser in the Last Year: Never true   Welcome in the Last Year: Never true  Transportation Needs: No Transportation Needs   Lack of Transportation (Medical): No   Lack of  Transportation (Non-Medical): No  Physical Activity: Sufficiently Active   Days of Exercise per Week: 7 days   Minutes of Exercise per Session: 50 min  Stress: No Stress Concern Present   Feeling of Stress : Not at all  Social Connections: Socially Integrated   Frequency of Communication with Friends and Family: More than three times a week   Frequency of Social Gatherings with Friends and Family: More than three times a week   Attends Religious Services: More than 4 times per year   Active Member of [employer] or Organizations: Yes   Attends Archivist Meetings: 1 to 4 times per year   Marital Status: Married  Human resources officer Violence: Not At Risk   Fear of Current or Ex-Partner: No   Emotionally Abused: No   Physically Abused: No   Sexually Abused: No    Review of Systems  Constitutional:  Negative for fatigue and unexpected weight change.  Eyes:  Negative for visual disturbance.  Respiratory:  Negative for cough, chest tightness and shortness of breath.   Cardiovascular:  Negative for chest pain, palpitations and leg swelling.  Gastrointestinal:  Negative for abdominal pain and blood in stool.  Neurological:  Negative for dizziness, light-headedness and headaches.    Objective:   Vitals:   03/31/21 1437  BP: 124/70  Pulse: 83  Temp: 98.3 F (36.8 C)  TempSrc: Temporal  SpO2: 97%  Weight: 184 lb 12.8 oz (83.8 kg)  Height: 5\' 11"  (1.803  m)     Physical Exam Vitals reviewed.  Constitutional:      Appearance: He is well-developed.  HENT:     Head: Normocephalic and atraumatic.  Neck:     Vascular: No carotid bruit or JVD.  Cardiovascular:     Rate and Rhythm: Normal rate and regular rhythm.     Heart sounds: Normal heart sounds. No murmur heard. Pulmonary:     Effort: Pulmonary effort is normal.     Breath sounds: Normal breath sounds. No rales.  Musculoskeletal:     Right lower leg: No edema.     Left lower leg: No edema.  Skin:    General: Skin is warm and dry.     Comments: Bilateral foot exam, cap refill less than 1 second at toes.  DP pulse 1+.  No cyanosis.  Sensation intact.  Neurological:     Mental Status: He is alert and oriented to person, place, and time.  Psychiatric:        Mood and Affect: Mood normal.     Assessment & Plan:  Andrew Tanner is a 70 y.o. male . Type 2 diabetes mellitus with hyperglycemia, without long-term current use of insulin (HCC) - Plan: Hemoglobin A1c, Microalbumin / creatinine urine ratio   -Clinically stable without recurrence of hypoglycemia.  Check A1c, urine microalbumin.  Continue same regimen.  Hyperlipidemia, unspecified hyperlipidemia type - Plan: Lipid panel, Comprehensive metabolic panel  -Tolerating current dose of Lipitor, continue same, check labs.  Essential hypertension - Plan: Comprehensive metabolic panel, lisinopril-hydrochlorothiazide (ZESTORETIC) 20-25 MG tablet  -Stable, refilled lisinopril.  Check labs.  Cold feet  -New concern.  Exam was reassuring.  It appears to be present only during winter months.  Warm footwear discussed with RTC precautions if any skin color changes, dysesthesias or new symptoms.  Meds ordered this encounter  Medications   lisinopril-hydrochlorothiazide (ZESTORETIC) 20-25 MG tablet    Sig: Take 1 tablet by mouth daily.    Dispense:  90 tablet  Refill:  1   empagliflozin (JARDIANCE) 25 MG TABS tablet    Sig: Take  1 tablet (25 mg total) by mouth daily before breakfast.    Dispense:  90 tablet    Refill:  1   Patient Instructions  Thanks for coming in today.  I suspect the A1c will be stable.  I will check the other labs.  No med changes for now.  Let me know if there are questions.  Follow-up in 6 months if A1c is stable.   Foot exam is reassuring today.  Make sure to wear thick or warm socks.  If you notice any change in sensation or other new symptoms be seen right away.    Signed,   [name], MD [place] 03/31/21 4:01 PM

## 2021-03-31 NOTE — Patient Instructions (Addendum)
Thanks for coming in today.  I suspect the A1c will be stable.  I will check the other labs.  No med changes for now.  Let me know if there are questions.  Follow-up in 6 months if A1c is stable.   Foot exam is reassuring today.  Make sure to wear thick or warm socks.  If you notice any change in sensation or other new symptoms be seen right away.

## 2021-04-01 ENCOUNTER — Encounter: Payer: Self-pay | Admitting: Family Medicine

## 2021-04-06 ENCOUNTER — Other Ambulatory Visit (INDEPENDENT_AMBULATORY_CARE_PROVIDER_SITE_OTHER): Payer: Medicare Other

## 2021-04-06 DIAGNOSIS — I10 Essential (primary) hypertension: Secondary | ICD-10-CM | POA: Diagnosis not present

## 2021-04-06 DIAGNOSIS — R1031 Right lower quadrant pain: Secondary | ICD-10-CM | POA: Diagnosis not present

## 2021-04-06 DIAGNOSIS — E785 Hyperlipidemia, unspecified: Secondary | ICD-10-CM

## 2021-04-06 DIAGNOSIS — G8929 Other chronic pain: Secondary | ICD-10-CM | POA: Diagnosis not present

## 2021-04-06 DIAGNOSIS — K862 Cyst of pancreas: Secondary | ICD-10-CM

## 2021-04-06 DIAGNOSIS — E1165 Type 2 diabetes mellitus with hyperglycemia: Secondary | ICD-10-CM

## 2021-04-06 LAB — COMPREHENSIVE METABOLIC PANEL
ALT: 20 U/L (ref 0–53)
AST: 17 U/L (ref 0–37)
Albumin: 3.7 g/dL (ref 3.5–5.2)
Alkaline Phosphatase: 62 U/L (ref 39–117)
BUN: 23 mg/dL (ref 6–23)
CO2: 27 mEq/L (ref 19–32)
Calcium: 9.6 mg/dL (ref 8.4–10.5)
Chloride: 102 mEq/L (ref 96–112)
Creatinine, Ser: 1.06 mg/dL (ref 0.40–1.50)
GFR: 70.48 mL/min (ref >60.00)
Glucose, Bld: 180 mg/dL — ABNORMAL HIGH (ref 70–99)
Potassium: 4.1 mEq/L (ref 3.5–5.1)
Sodium: 138 mEq/L (ref 135–145)
Total Bilirubin: 0.4 mg/dL (ref 0.2–1.2)
Total Protein: 5.6 g/dL — ABNORMAL LOW (ref 6.0–8.3)

## 2021-04-06 LAB — HEMOGLOBIN A1C: Hgb A1c MFr Bld: 7 % — ABNORMAL HIGH (ref 4.6–6.5)

## 2021-04-06 LAB — MICROALBUMIN / CREATININE URINE RATIO
Creatinine,U: 95.3 mg/dL
Microalb Creat Ratio: 0.7 mg/g (ref 0.0–30.0)
Microalb, Ur: <0.7 mg/dL (ref 0.0–1.9)

## 2021-04-06 LAB — LIPID PANEL
Cholesterol: 162 mg/dL (ref 0–200)
HDL: 35.2 mg/dL — ABNORMAL LOW (ref >39.00)
LDL Cholesterol: 93 mg/dL (ref 0–99)
NonHDL: 126.9
Total CHOL/HDL Ratio: 5
Triglycerides: 170 mg/dL — ABNORMAL HIGH (ref 0.0–149.0)
VLDL: 34 mg/dL (ref 0.0–40.0)

## 2021-04-07 LAB — BUN/CREATININE RATIO
BUN: 24 mg/dL (ref 7–25)
Creat: 1.07 mg/dL (ref 0.70–1.28)
eGFR: 74 mL/min/{1.73_m2} (ref >=60)

## 2021-05-03 DIAGNOSIS — F3341 Major depressive disorder, recurrent, in partial remission: Secondary | ICD-10-CM | POA: Diagnosis not present

## 2021-05-10 ENCOUNTER — Other Ambulatory Visit: Payer: Self-pay | Admitting: Family Medicine

## 2021-05-10 ENCOUNTER — Ambulatory Visit (INDEPENDENT_AMBULATORY_CARE_PROVIDER_SITE_OTHER): Payer: Medicare Other | Admitting: Family Medicine

## 2021-05-10 ENCOUNTER — Encounter: Payer: Self-pay | Admitting: Family Medicine

## 2021-05-10 VITALS — BP 110/62 | HR 86 | Temp 98.2°F | Resp 16 | Ht 71.0 in | Wt 185.4 lb

## 2021-05-10 DIAGNOSIS — R200 Anesthesia of skin: Secondary | ICD-10-CM | POA: Diagnosis not present

## 2021-05-10 DIAGNOSIS — R209 Unspecified disturbances of skin sensation: Secondary | ICD-10-CM | POA: Diagnosis not present

## 2021-05-10 DIAGNOSIS — R202 Paresthesia of skin: Secondary | ICD-10-CM

## 2021-05-10 MED ORDER — PREGABALIN 50 MG PO CAPS: 50.0000 mg | ORAL_CAPSULE | Freq: Three times a day (TID) | ORAL | 1 refills | Status: DC | PRN

## 2021-05-10 NOTE — Patient Instructions (Signed)
Exam is reassuring today, but I will refer you to a vascular specialist to evaluate for possible circulation issue but less likely.  Lyrica can be tried initially once per day, all the way up to 3 times per day if needed for burning sensation in the feet.  If continued symptoms it may be worth meeting with neurology or discussing with your neurosurgeon if this could be related to your spine.  Keep me posted.  Let me know if there are questions.

## 2021-05-10 NOTE — Progress Notes (Signed)
Subjective:  Patient ID: Andrew Tanner, male    DOB: Dec 06, 1950  Age: 71 y.o. MRN: [id number]  CC:  Chief Complaint  Patient presents with   Cold Feet    Pt reports cold feet intermittent coldness, feels this year has been worse than past years. Reports no pain some stinging in pad of both toes, no discoloration no swelling     HPI Andrew Tanner presents for   Cold feet/dysesthesias in feet Has noticed in past but feels worse over the past year.  Discussed at his December 21 visit.  Noted during the winter months at that time.   Warm footwear discussed, thicker socks.  Some stinging sensation in the pads of his toes but no discoloration or swelling.  History of diabetes with variable control previously.  He has been treated with gabapentin for back pain/nerve issues but did not tolerate, felt off taking that medication.  History of cervical spine disease, previous surgery, followed by neurosurgeon Dr. Ellene Route.  Has been told in the past that some of his foot symptoms could be related to his spine. He did report home foot testing from his insurance was normal last year. Notices cold feeling and tingling in toes with sitting in chair. Improves with walking.  No pain/numbness shooting down legs.  No pain/numbness with walking distances.  No color changes.  Tx: wool socks, sometimes 2. Feel more cold with thicker socks.  Fasting glucose 97 today.  Lab Results  Component Value Date   HGBA1C 7.0 (H) 04/06/2021      Diabetic Foot Exam - Simple   No data filed      History Patient Active Problem List   Diagnosis Date Noted   Positive colorectal cancer screening using Cologuard test 03/16/2020   Chronic RLQ pain 03/16/2020   Diabetes mellitus type II 06/02/2011   Hypertension 06/02/2011   Hyperlipemia 06/02/2011   Cataract 06/02/2011   Past Medical History:  Diagnosis Date   Anxiety    Cancer ([place])    basal cell skin CA   Cataract    Depression    Diabetes  mellitus without complication ([place])    Hyperlipidemia    Hypertension    IC (interstitial cystitis)    Past Surgical History:  Procedure Laterality Date   CATARACT EXTRACTION, BILATERAL     HERNIA REPAIR     NECK SURGERY     SPINE SURGERY     TRANSURETHRAL RESECTION OF PROSTATE     TURP VAPORIZATION     No Known Allergies Prior to Admission medications   Medication Sig Start Date End Date Taking? Authorizing Provider  alfuzosin (UROXATRAL) 10 MG 24 hr tablet Take 1 tablet by mouth once daily with breakfast 02/16/21  Yes Andrew Agreste, MD  atorvastatin (LIPITOR) 20 MG tablet TAKE 1 TABLET BY MOUTH ONCE DAILY AT  6  PM 11/19/20  Yes Andrew Agreste, MD  clonazePAM (KLONOPIN) 0.5 MG tablet Take 0.5 mg by mouth 3 (three) times daily as needed.   Yes [provider]  empagliflozin (JARDIANCE) 25 MG TABS tablet Take 1 tablet (25 mg total) by mouth daily before breakfast. 03/31/21  Yes Andrew Agreste, MD  escitalopram (LEXAPRO) 10 MG tablet Take 10 mg by mouth daily. 01/22/20  Yes [provider]  FLUoxetine (PROZAC) 40 MG capsule Take 40 mg by mouth daily. Take one table daily   Yes [provider]  glipiZIDE (GLUCOTROL) 10 MG tablet TAKE 2 TABLETS BY MOUTH TWICE DAILY BEFORE A  MEAL 02/16/21  Yes Andrew Agreste, MD  glucose blood test strip 1 each by Other route daily. Use as instructed checking once per day, or if new symptoms. 05/18/20  Yes Andrew Agreste, MD  hydrocortisone 2.5 % ointment APPLY TO AFFECTED AREA EVERYDAY AT BEDTIME 08/19/19  Yes Andrew Monarch, MD  hydrOXYzine (ATARAX/VISTARIL) 50 MG tablet Take 50 mg by mouth at bedtime. 12/16/20  Yes [provider]  ibuprofen (ADVIL,MOTRIN) 800 MG tablet Take 1 tablet (800 mg total) by mouth every 8 (eight) hours as needed for pain. 01/09/12  Yes Andrew Knapp, MD  lisinopril-hydrochlorothiazide (ZESTORETIC) 20-25 MG tablet Take 1 tablet by mouth daily. 03/31/21  Yes Andrew Agreste, MD  metFORMIN  (GLUCOPHAGE) 500 MG tablet Take 1 tablet (500 mg total) by mouth 3 (three) times daily. 11/19/20  Yes Andrew Agreste, MD  methocarbamol (ROBAXIN) 500 MG tablet Take 1 tablet (500 mg total) by mouth 2 (two) times daily as needed for muscle spasms. 07/20/20  Yes Andrew Coss, Andrew Tanner  Misc Natural Products (PUMPKIN SEED OIL PO) Take by mouth.   Yes [provider]  Turmeric (QC TUMERIC COMPLEX PO) Take by mouth.   Yes [provider]  diclofenac (VOLTAREN) 75 MG EC tablet Take 1 tablet (75 mg total) by mouth 2 (two) times daily as needed. Patient not taking: Reported on 03/31/2021 07/20/20   Andrew Coss, Andrew Tanner   Social History   Socioeconomic History   Marital status: Married    Spouse name: Andrew Tanner   Number of children: Not on file   Years of education: Not on file   Highest education level: Not on file  Occupational History   Not on file  Tobacco Use   Smoking status: Former   Smokeless tobacco: Never   Tobacco comments:    quit 28 yrs ago  Vaping Use   Vaping Use: Never used  Substance and Sexual Activity   Alcohol use: Yes    Comment: 1 glass wine per week   Drug use: No   Sexual activity: Never  Other Topics Concern   Not on file  Social History Narrative   Not on file   Social Determinants of Health   Financial Resource Strain: Low Risk    Difficulty of Paying Living Expenses: Not hard at all  Food Insecurity: No Food Insecurity   Worried About Charity fundraiser in the Last Year: Never true   [place] in the Last Year: Never true  Transportation Needs: No Transportation Needs   Lack of Transportation (Medical): No   Lack of Transportation (Non-Medical): No  Physical Activity: Sufficiently Active   Days of Exercise per Week: 7 days   Minutes of Exercise per Session: 50 min  Stress: No Stress Concern Present   Feeling of Stress : Not at all  Social Connections: Socially Integrated   Frequency of Communication with Friends and  Family: More than three times a week   Frequency of Social Gatherings with Friends and Family: More than three times a week   Attends Religious Services: More than 4 times per year   Active Member of [employer] or Organizations: Yes   Attends Archivist Meetings: 1 to 4 times per year   Marital Status: Married  Human resources officer Violence: Not At Risk   Fear of Current or Ex-Partner: No   Emotionally Abused: No   Physically Abused: No   Sexually Abused: No    Review of Systems  Per hPI.   Objective:   Vitals:   05/10/21 1016  BP: 110/62  Pulse: 86  Resp: 16  Temp: 98.2 F (36.8 C)  TempSrc: Temporal  SpO2: 97%  Weight: 185 lb 6.4 oz (84.1 kg)  Height: 5\' 11"  (1.803 m)     Physical Exam Constitutional:      General: He is not in acute distress.    Appearance: Normal appearance. He is well-developed.  HENT:     Head: Normocephalic and atraumatic.  Cardiovascular:     Rate and Rhythm: Normal rate.  Pulmonary:     Effort: Pulmonary effort is normal.  Musculoskeletal:     Comments: Bilateral feet without edema or color changes.  Skin intact.  DP pulse palpable bilaterally, equal.  Cap refill less than 1 second at toes, cool.  No cyanosis.  No appreciable wounds.  Neurological:     Mental Status: He is alert and oriented to person, place, and time.  Psychiatric:        Mood and Affect: Mood normal.       Assessment & Plan:  Andrew Tanner is a 71 y.o. male . Cold feet - Plan: Ambulatory referral to Vascular Surgery, pregabalin (LYRICA) 50 MG capsule  Numbness and tingling of foot - Plan: Ambulatory referral to Vascular Surgery, pregabalin (LYRICA) 50 MG capsule  Denies true claudication, symptoms appear to be more with seated position, more likely neurologic with his spine disease.  Will refer to vascular to eval for circulation issues given his medical history, possible ABIs but will try Lyrica temporarily if needed.  Potential side effects discussed.   Consider follow-up with neurosurgeon or neurology for nerve conduction testing if persistent or worsening symptoms.  Meds ordered this encounter  Medications   pregabalin (LYRICA) 50 MG capsule    Sig: Take 1 capsule (50 mg total) by mouth 3 (three) times daily as needed.    Dispense:  90 capsule    Refill:  1   Patient Instructions  Exam is reassuring today, but I will refer you to a vascular specialist to evaluate for possible circulation issue but less likely.  Lyrica can be tried initially once per day, all the way up to 3 times per day if needed for burning sensation in the feet.  If continued symptoms it may be worth meeting with neurology or discussing with your neurosurgeon if this could be related to your spine.  Keep me posted.  Let me know if there are questions.     Signed,   Andrew Ray, MD [place] 05/10/21 10:55 AM

## 2021-05-11 ENCOUNTER — Other Ambulatory Visit: Payer: Self-pay

## 2021-05-18 ENCOUNTER — Other Ambulatory Visit: Payer: Self-pay

## 2021-05-18 DIAGNOSIS — I739 Peripheral vascular disease, unspecified: Secondary | ICD-10-CM

## 2021-05-20 ENCOUNTER — Encounter: Payer: Self-pay | Admitting: Vascular Surgery

## 2021-05-20 ENCOUNTER — Other Ambulatory Visit: Payer: Self-pay

## 2021-05-20 ENCOUNTER — Ambulatory Visit: Payer: Medicare Other | Admitting: Vascular Surgery

## 2021-05-20 ENCOUNTER — Ambulatory Visit (HOSPITAL_COMMUNITY)
Admission: RE | Admit: 2021-05-20 | Discharge: 2021-05-20 | Disposition: A | Discharge status: Home / Self Care | Payer: Medicare Other | Source: Ambulatory Visit | Attending: Vascular Surgery | Admitting: Vascular Surgery

## 2021-05-20 VITALS — BP 106/71 | HR 90 | Temp 98.6°F | Resp 20 | Ht 71.0 in | Wt 184.0 lb

## 2021-05-20 DIAGNOSIS — I739 Peripheral vascular disease, unspecified: Secondary | ICD-10-CM

## 2021-05-20 DIAGNOSIS — R202 Paresthesia of skin: Secondary | ICD-10-CM

## 2021-05-20 NOTE — Progress Notes (Signed)
ASSESSMENT & PLAN   PARESTHESIAS BOTH FEET: I do not think the patient's paresthesias can be related to peripheral arterial disease.  He has palpable pedal pulses, normal Doppler signals in both feet, normal ABIs, and normal toe pressures.  He does not have any signs or symptoms of significant venous disease also.  Given his history of diabetes I suspect this is related to neuropathy.  The other consideration would be that it could be related to lumbar disc disease however he only occasionally has back pain.  I reassured him that his symptoms are not related to arterial occlusive disease and I will be happy to see him back at any time if any new vascular issues arise.  REASON FOR CONSULT:    To evaluate for peripheral arterial disease.  The consult is requested by Dr. [name].   HPI:   Andrew Tanner is a 71 y.o. male who was referred for evaluation of peripheral arterial disease.  I have reviewed the notes from the referring office.  The patient was seen on 05/10/2021 with a chief complaint of cold feet and paresthesias in his feet.  This has been worsening over the last year.  On my history, I do not get any history of claudication, rest pain, or nonhealing ulcers.  He has had paresthesias for many years but these have been worse recently.  He also complains of his feet being cold in the winter.  He has had previous surgery on his thoracic spine but did not have any issues with lower extremity paresthesias after his surgery.  He does have a history of type 2 diabetes.  His risk factors for peripheral vascular disease include type 2 diabetes, hypertension, hypercholesterolemia, and a remote history of tobacco use.  He denies any family history of premature cardiovascular disease.   Past Medical History:  Diagnosis Date   Anxiety    Cancer ([place])    basal cell skin CA   Cataract    Depression    Diabetes mellitus without complication ([place])    Hyperlipidemia    Hypertension     IC (interstitial cystitis)     Family History  Problem Relation Age of Onset   Heart disease Father    Hyperlipidemia Father    Diabetes Father        type 1   Diabetes Brother    Depression Brother    Renal cancer Brother    Lung cancer Mother    Stomach cancer Neg Hx    Colon cancer Neg Hx    Pancreatic cancer Neg Hx    Esophageal cancer Neg Hx    Rectal cancer Neg Hx     SOCIAL HISTORY: Social History   Tobacco Use   Smoking status: Former   Smokeless tobacco: Never   Tobacco comments:    quit 28 yrs ago  Substance Use Topics   Alcohol use: Yes    Comment: 1 glass wine per week    No Known Allergies  Current Outpatient Medications  Medication Sig Dispense Refill   alfuzosin (UROXATRAL) 10 MG 24 hr tablet Take 1 tablet by mouth once daily with breakfast 90 tablet 0   atorvastatin (LIPITOR) 20 MG tablet TAKE 1 TABLET BY MOUTH ONCE DAILY AT  6  PM 90 tablet 3   clonazePAM (KLONOPIN) 0.5 MG tablet Take 0.5 mg by mouth 3 (three) times daily as needed.     empagliflozin (JARDIANCE) 25 MG TABS tablet Take 1 tablet (25 mg total) by mouth  daily before breakfast. 90 tablet 1   escitalopram (LEXAPRO) 10 MG tablet Take 10 mg by mouth daily.     FLUoxetine (PROZAC) 40 MG capsule Take 40 mg by mouth daily. Take one table daily     glipiZIDE (GLUCOTROL) 10 MG tablet TAKE 2 TABLETS BY MOUTH TWICE DAILY BEFORE A MEAL 120 tablet 2   glucose blood test strip 1 each by Other route daily. Use as instructed checking once per day, or if new symptoms. 50 each 8   hydrocortisone 2.5 % ointment APPLY TO AFFECTED AREA EVERYDAY AT BEDTIME 29 g 0   hydrOXYzine (ATARAX/VISTARIL) 50 MG tablet Take 50 mg by mouth at bedtime.     ibuprofen (ADVIL,MOTRIN) 800 MG tablet Take 1 tablet (800 mg total) by mouth every 8 (eight) hours as needed for pain. 30 tablet 0   lisinopril-hydrochlorothiazide (ZESTORETIC) 20-25 MG tablet Take 1 tablet by mouth daily. 90 tablet 1   metFORMIN (GLUCOPHAGE) 500 MG  tablet Take 1 tablet (500 mg total) by mouth 3 (three) times daily. 270 tablet 1   methocarbamol (ROBAXIN) 500 MG tablet Take 1 tablet (500 mg total) by mouth 2 (two) times daily as needed for muscle spasms. 30 tablet 0   Misc Natural Products (PUMPKIN SEED OIL PO) Take by mouth.     pregabalin (LYRICA) 50 MG capsule Take 1 capsule (50 mg total) by mouth 3 (three) times daily as needed. 90 capsule 1   Turmeric (QC TUMERIC COMPLEX PO) Take by mouth.     diclofenac (VOLTAREN) 75 MG EC tablet Take 1 tablet (75 mg total) by mouth 2 (two) times daily as needed. (Patient not taking: Reported on 03/31/2021) 30 tablet 0   No current facility-administered medications for this visit.    REVIEW OF SYSTEMS:  [X]  denotes positive finding, [ ]  denotes negative finding Cardiac  Comments:  Chest pain or chest pressure:    Shortness of breath upon exertion:    Short of breath when lying flat:    Irregular heart rhythm:        Vascular    Pain in calf, thigh, or hip brought on by ambulation:    Pain in feet at night that wakes you up from your sleep:     Blood clot in your veins:    Leg swelling:         Pulmonary    Oxygen at home:    Productive cough:     Wheezing:         Neurologic    Sudden weakness in arms or legs:     Sudden numbness in arms or legs:     Sudden onset of difficulty speaking or slurred speech:    Temporary loss of vision in one eye:     Problems with dizziness:         Gastrointestinal    Blood in stool:     Vomited blood:         Genitourinary    Burning when urinating:     Blood in urine:        Psychiatric    Major depression:         Hematologic    Bleeding problems:    Problems with blood clotting too easily:        Skin    Rashes or ulcers:        Constitutional    Fever or chills:    -  PHYSICAL EXAM:   Vitals:   05/20/21  1048  BP: 106/71  Pulse: 90  Resp: 20  Temp: 98.6 F (37 C)  SpO2: 96%  Weight: 184 lb (83.5 kg)  Height: 5\' 11"   (1.803 m)   Body mass index is 25.66 kg/m. GENERAL: The patient is a well-nourished male, in no acute distress. The vital signs are documented above. CARDIAC: There is a regular rate and rhythm.  VASCULAR: I do not detect carotid bruits. He has palpable femoral, dorsalis pedis, and posterior tibial pulses bilaterally. He has no significant lower extremity swelling. He has no varicose veins or hyperpigmentation to suggest chronic venous insufficiency. PULMONARY: There is good air exchange bilaterally without wheezing or rales. ABDOMEN: Soft and non-tender with normal pitched bowel sounds.  I do not palpate an aneurysm. MUSCULOSKELETAL: There are no major deformities. NEUROLOGIC: No focal weakness or paresthesias are detected. SKIN: There are no ulcers or rashes noted. PSYCHIATRIC: The patient has a normal affect.  DATA:    ARTERIAL DOPPLER STUDY: I have independently interpreted his arterial Doppler study today.  On the right side there is a triphasic dorsalis pedis and posterior tibial signal.  ABIs 100%.  Toe pressure is 95 mmHg.  On the left side there is a biphasic posterior tibial signal and a triphasic dorsalis pedis signal.  ABIs 100%.  Toe pressures 104 mmHg.  Deitra Mayo [facility]

## 2021-06-08 ENCOUNTER — Other Ambulatory Visit: Payer: Self-pay

## 2021-06-08 ENCOUNTER — Telehealth: Payer: Self-pay | Admitting: Nurse Practitioner

## 2021-06-08 DIAGNOSIS — K862 Cyst of pancreas: Secondary | ICD-10-CM

## 2021-06-08 DIAGNOSIS — I10 Essential (primary) hypertension: Secondary | ICD-10-CM

## 2021-06-08 NOTE — Telephone Encounter (Signed)
Inbound call from patient would like to know if he need to do a follow up MRI based off last year findings.

## 2021-06-08 NOTE — Telephone Encounter (Signed)
MRI/MRCP of last February 2022. Planned for a CT pancreatic protocol.  Advised the patient of this. Order for this CT placed. Schedulers will be notified. Patient aware he will be contacted to schedule in the next few days.

## 2021-06-14 ENCOUNTER — Other Ambulatory Visit (INDEPENDENT_AMBULATORY_CARE_PROVIDER_SITE_OTHER): Payer: Medicare Other

## 2021-06-14 DIAGNOSIS — I10 Essential (primary) hypertension: Secondary | ICD-10-CM

## 2021-06-14 LAB — BUN: BUN: 33 mg/dL — ABNORMAL HIGH (ref 6–23)

## 2021-06-14 LAB — CREATININE, SERUM: Creatinine, Ser: 1.09 mg/dL (ref 0.40–1.50)

## 2021-06-15 ENCOUNTER — Other Ambulatory Visit: Payer: Self-pay

## 2021-06-15 NOTE — Progress Notes (Signed)
error 

## 2021-06-16 ENCOUNTER — Other Ambulatory Visit: Payer: Self-pay

## 2021-06-16 DIAGNOSIS — K862 Cyst of pancreas: Secondary | ICD-10-CM

## 2021-06-16 NOTE — Progress Notes (Signed)
ERROR

## 2021-06-17 ENCOUNTER — Ambulatory Visit (HOSPITAL_BASED_OUTPATIENT_CLINIC_OR_DEPARTMENT_OTHER)
Admission: RE | Admit: 2021-06-17 | Discharge: 2021-06-17 | Disposition: A | Discharge status: Home / Self Care | Payer: Medicare Other | Source: Ambulatory Visit | Attending: Nurse Practitioner | Admitting: Nurse Practitioner

## 2021-06-17 ENCOUNTER — Other Ambulatory Visit: Payer: Self-pay

## 2021-06-17 ENCOUNTER — Encounter (HOSPITAL_BASED_OUTPATIENT_CLINIC_OR_DEPARTMENT_OTHER): Payer: Self-pay

## 2021-06-17 DIAGNOSIS — K862 Cyst of pancreas: Secondary | ICD-10-CM

## 2021-06-17 DIAGNOSIS — N281 Cyst of kidney, acquired: Secondary | ICD-10-CM | POA: Diagnosis not present

## 2021-06-17 MED ORDER — IOHEXOL 300 MG/ML  SOLN
100.0000 mL | Freq: Once | INTRAMUSCULAR | Status: AC | PRN
  Administered 2021-06-17: 08:00:00 85 mL via INTRAVENOUS

## 2021-06-21 ENCOUNTER — Other Ambulatory Visit: Payer: Self-pay

## 2021-06-21 DIAGNOSIS — K862 Cyst of pancreas: Secondary | ICD-10-CM

## 2021-07-06 ENCOUNTER — Encounter: Payer: Self-pay | Admitting: Family Medicine

## 2021-07-06 ENCOUNTER — Other Ambulatory Visit: Payer: Self-pay | Admitting: Family Medicine

## 2021-07-06 DIAGNOSIS — E1165 Type 2 diabetes mellitus with hyperglycemia: Secondary | ICD-10-CM

## 2021-07-07 NOTE — Telephone Encounter (Signed)
Pt notes continued nerve pain in feet with neuropathy, notes cold feet have improved with weather  ?Pt is requesting advice on next steps considering sxs and treatment to this point  ?

## 2021-07-10 ENCOUNTER — Encounter: Payer: Self-pay | Admitting: Family Medicine

## 2021-07-12 NOTE — Telephone Encounter (Signed)
FYI from pt about appt the 6th ?

## 2021-07-15 ENCOUNTER — Ambulatory Visit (INDEPENDENT_AMBULATORY_CARE_PROVIDER_SITE_OTHER): Payer: Medicare Other | Admitting: Family Medicine

## 2021-07-15 ENCOUNTER — Other Ambulatory Visit: Payer: Self-pay

## 2021-07-15 ENCOUNTER — Encounter: Payer: Self-pay | Admitting: Family Medicine

## 2021-07-15 VITALS — BP 100/62 | HR 93 | Temp 98.2°F | Resp 16 | Ht 71.0 in | Wt 184.0 lb

## 2021-07-15 DIAGNOSIS — G6289 Other specified polyneuropathies: Secondary | ICD-10-CM | POA: Diagnosis not present

## 2021-07-15 DIAGNOSIS — E1165 Type 2 diabetes mellitus with hyperglycemia: Secondary | ICD-10-CM

## 2021-07-15 LAB — BASIC METABOLIC PANEL
BUN: 27 mg/dL — ABNORMAL HIGH (ref 6–23)
CO2: 26 mEq/L (ref 19–32)
Calcium: 10.5 mg/dL (ref 8.4–10.5)
Chloride: 102 mEq/L (ref 96–112)
Creatinine, Ser: 1.1 mg/dL (ref 0.40–1.50)
GFR: 67.29 mL/min (ref >60.00)
Glucose, Bld: 247 mg/dL — ABNORMAL HIGH (ref 70–99)
Potassium: 4.2 mEq/L (ref 3.5–5.1)
Sodium: 137 mEq/L (ref 135–145)

## 2021-07-15 LAB — VITAMIN B12: Vitamin B-12: >1504 pg/mL — ABNORMAL HIGH (ref 211–911)

## 2021-07-15 LAB — HEMOGLOBIN A1C: Hgb A1c MFr Bld: 7.1 % — ABNORMAL HIGH (ref 4.6–6.5)

## 2021-07-15 LAB — TSH: TSH: 1.24 u[IU]/mL (ref 0.35–5.50)

## 2021-07-15 MED ORDER — PREGABALIN 100 MG PO CAPS: 100.0000 mg | ORAL_CAPSULE | Freq: Three times a day (TID) | ORAL | 1 refills | Status: DC

## 2021-07-15 NOTE — Patient Instructions (Addendum)
I will refer you to neurologist for likely nerve conduction studies.  ?Increase to '100mg'$  lyrica in the morning, continue '50mg'$  at other doses. If tolerated, can increase midday dose to '100mg'$  after 1 week, then nighttime dose in 1 more week if not improved.  ? ? ? ?If you have lab work done today you will be contacted with your lab results within the next 2 weeks.  If you have not heard from [place] then please contact us. The fastest way to get your results is to register for My Chart. ? ? ?IF you received an x-ray today, you will receive an invoice from [facility]. Please contact [facility] at [phone] with questions or concerns regarding your invoice.  ? ?IF you received labwork today, you will receive an invoice from [facility]. Please contact [facility] at [phone] with questions or concerns regarding your invoice.  ? ?Our billing staff will not be able to assist you with questions regarding bills from these companies. ? ?You will be contacted with the lab results as soon as they are available. The fastest way to get your results is to activate your My Chart account. Instructions are located on the last page of this paperwork. If you have not heard from [place] regarding the results in 2 weeks, please contact this office. ?  ? ? ?

## 2021-07-15 NOTE — Progress Notes (Addendum)
? ?Subjective:  ?Patient ID: Andrew Tanner, male    DOB: 1950/07/11  Age: 71 y.o. MRN: [id number] ? ?CC:  ?Chief Complaint  ?Patient presents with  ? Medication Management  ?  Patient states he is just following up on medications. No medication refills needed at this time and no concerns.  ? ? ?HPI ?Andrew Tanner presents for  ? ?Foot paresthesias, sensation of cold feet ?Discussed at his January visit.  Denied true claudication, symptoms with seated position likely more neurologic with underlying spine disease.  He was referred to vascular for eval for circulation issues but started Lyrica 50 mg 3 times daily.  Option of neurosurgery or neurology follow-up for possible nerve conduction testing if persistent. ?Appointment with vascular February 9 noted.  Normal ABIs, normal Doppler signals in feet.  Thought to be neuropathy. ?Chiropractor at [facility] eval 07/09/21 - peripheral neuropathy exam (pinwheel exam on hands, feet) with decreased sensation - report provided 29/74 on R leg, 35/74 left leg.  ?Arms only 4.3% loss. Options of infrared, TENS, vitamins.  ?Still some foot burning, decreased sensation. Worse with socks.  ?Better at night, persistent during day.  ?Unable to take gabapentin - disorientation.  ?Taking pregabalin '50mg'$  tid. - no change in symptoms. No side effects. Researched peripheral neuropathy on his own - would like to meet with specialist.  ? ? ?Diabetes: ?With history of hyperglycemia, stable A1c overall in December.  Continued on metformin '500mg'$  bid, Jardiance '25mg'$  QAM, glipizide '10mg'$  BID.  He is on statin, ACE inhibitor. No new side effects.  ?Home readings fasting:102-140 ?Home readings postprandial: 91-190.  ?Symptomatic lows: felt low a few times. Not checking when feeling low - BG 190 after cereal to treat.  ?Snacks b/t meals.  ?Microalbumin: Normal ratio 04/06/2021 ?Optho, foot exam, pneumovax: ?Foot dysesthesias as above.  Ophthalmology exam: last June,   ? ? ?Lab Results  ?Component Value Date  ? HGBA1C 7.0 (H) 04/06/2021  ? HGBA1C 6.8 (A) 12/21/2020  ? HGBA1C 6.7 (H) 08/17/2020  ? ?Lab Results  ?Component Value Date  ? MICROALBUR <0.7 04/06/2021  ? [place] 93 04/06/2021  ? CREATININE 1.09 06/14/2021  ? ? ?History ?Patient Active Problem List  ? Diagnosis Date Noted  ? Positive colorectal cancer screening using Cologuard test 03/16/2020  ? 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 Diabetes mellitus type II 06/02/2011  ? Hypertension 06/02/2011  ? Hyperlipemia 06/02/2011  ? Cataract 06/02/2011  ? ?Past Medical History:  ?Diagnosis Date  ? Anxiety   ? Cancer [facility])   ? basal cell skin CA  ? Cataract   ? Depression   ? Diabetes mellitus without complication ([place])   ? Hyperlipidemia   ? Hypertension   ? IC (interstitial cystitis)   ? ?Past Surgical History:  ?Procedure Laterality Date  ? CATARACT EXTRACTION, BILATERAL    ? HERNIA REPAIR    ? NECK SURGERY    ? SPINE SURGERY    ? TRANSURETHRAL RESECTION OF PROSTATE    ? TURP VAPORIZATION    ? ?No Known Allergies ?Prior to Admission medications   ?Medication Sig Start Date End Date Taking? Authorizing Provider  ?alfuzosin (UROXATRAL) 10 MG 24 hr tablet Take 1 tablet by mouth once daily with breakfast 05/11/21  Yes [name], MD  ?atorvastatin (LIPITOR) 20 MG tablet TAKE 1 TABLET BY MOUTH ONCE DAILY AT  6  PM 11/19/20  Yes [name], MD  ?clonazePAM (KLONOPIN) 0.5 MG tablet Take 0.5 mg by mouth 3 (three)  times daily as needed.   Yes [provider]  ?escitalopram (LEXAPRO) 10 MG tablet Take 10 mg by mouth daily. 01/22/20  Yes [provider]  ?FLUoxetine (PROZAC) 40 MG capsule Take 40 mg by mouth daily. Take one table daily   Yes [provider]  ?glipiZIDE (GLUCOTROL) 10 MG tablet TAKE 2 TABLETS BY MOUTH TWICE DAILY BEFORE A MEAL 07/06/21  Yes [name], MD  ?glucose blood test strip 1 each by Other route daily. Use as instructed checking once per day, or if new symptoms. 05/18/20  Yes  [name], MD  ?hydrocortisone 2.5 % ointment APPLY TO AFFECTED AREA EVERYDAY AT BEDTIME 08/19/19  Yes Lavonna Monarch, MD  ?hydrOXYzine (ATARAX/VISTARIL) 50 MG tablet Take 50 mg by mouth at bedtime. 12/16/20  Yes [provider]  ?ibuprofen (ADVIL,MOTRIN) 800 MG tablet Take 1 tablet (800 mg total) by mouth every 8 (eight) hours as needed for pain. 01/09/12  Yes Shawnee Knapp, MD  ?JARDIANCE 25 MG TABS tablet TAKE 1 TABLET BY MOUTH ONCE DAILY BEFORE BREAKFAST 07/06/21  Yes [name], MD  ?lisinopril-hydrochlorothiazide (ZESTORETIC) 20-25 MG tablet Take 1 tablet by mouth daily. 03/31/21  Yes [name], MD  ?metFORMIN (GLUCOPHAGE) 500 MG tablet Take 1 tablet (500 mg total) by mouth 3 (three) times daily. 11/19/20  Yes [name], MD  ?methocarbamol (ROBAXIN) 500 MG tablet Take 1 tablet (500 mg total) by mouth 2 (two) times daily as needed for muscle spasms. 07/20/20  Yes Maximiano Coss, NP  ?Misc Natural Products (PUMPKIN SEED OIL PO) Take by mouth.   Yes [provider]  ?pregabalin (LYRICA) 50 MG capsule Take 1 capsule (50 mg total) by mouth 3 (three) times daily as needed. 05/10/21  Yes [name], MD  ?Turmeric (QC TUMERIC COMPLEX PO) Take by mouth.   Yes [provider]  ?diclofenac (VOLTAREN) 75 MG EC tablet Take 1 tablet (75 mg total) by mouth 2 (two) times daily as needed. ?Patient not taking: Reported on 03/31/2021 07/20/20   Maximiano Coss, NP  ? ?Social History  ? ?Socioeconomic History  ? Marital status: Married  ?  Spouse name: [name]  ? Number of children: Not on file  ? Years of education: Not on file  ? Highest education level: Not on file  ?Occupational History  ? Not on file  ?Tobacco Use  ? Smoking status: Former  ? Smokeless tobacco: Never  ? Tobacco comments:  ?  quit 28 yrs ago  ?Vaping Use  ? Vaping Use: Never used  ?Substance and Sexual Activity  ? Alcohol use: Yes  ?  Comment: 1 glass wine per week  ? Drug use: No  ? Sexual  activity: Never  ?Other Topics Concern  ? Not on file  ?Social History Narrative  ? Not on file  ? ?Social Determinants of Health  ? ?Financial Resource Strain: Low Risk   ? Difficulty of Paying Living Expenses: Not hard at all  ?Food Insecurity: No Food Insecurity  ? Worried About Charity fundraiser in the Last Year: Never true  ? Ran Out of Food in the Last Year: Never true  ?Transportation Needs: No Transportation Needs  ? Lack of Transportation (Medical): No  ? Lack of Transportation (Non-Medical): No  ?Physical Activity: Sufficiently Active  ? Days of Exercise per Week: 7 days  ? Minutes of Exercise per Session: 50 min  ?Stress: No Stress Concern Present  ? Feeling of Stress :  Not at all  ?Social Connections: Socially Integrated  ? Frequency of Communication with Friends and Family: More than three times a week  ? Frequency of Social Gatherings with Friends and Family: More than three times a week  ? Attends Religious Services: More than 4 times per year  ? Active Member of Clubs or Organizations: Yes  ? Attends Archivist Meetings: 1 to 4 times per year  ? Marital Status: Married  ?Intimate Partner Violence: Not At Risk  ? Fear of Current or Ex-Partner: No  ? Emotionally Abused: No  ? Physically Abused: No  ? Sexually Abused: No  ? ? ?Review of Systems ?Per HPI.  ? ?Objective:  ? ?Vitals:  ? 07/15/21 1218  ?BP: 100/62  ?Pulse: 93  ?Resp: 16  ?Temp: 98.2 ?F (36.8 ?C)  ?TempSrc: Temporal  ?SpO2: 98%  ?Weight: 184 lb (83.5 kg)  ?Height: '5\' 11"'$  (1.803 m)  ? ? ? ?Physical Exam ?Vitals reviewed.  ?Constitutional:   ?   Appearance: He is well-developed.  ?HENT:  ?   Head: Normocephalic and atraumatic.  ?Neck:  ?   Vascular: No carotid bruit or JVD.  ?Cardiovascular:  ?   Rate and Rhythm: Normal rate and regular rhythm.  ?   Heart sounds: Normal heart sounds. No murmur heard. ?Pulmonary:  ?   Effort: Pulmonary effort is normal.  ?   Breath sounds: Normal breath sounds. No rales.  ?Musculoskeletal:  ?    Right lower leg: No edema.  ?   Left lower leg: No edema.  ?Skin: ?   General: Skin is warm and dry.  ?   Comments: Skin intact on lower extremities, no cyanosis, reports cool sensation with touch.  ?Neurological:  ?

## 2021-07-20 ENCOUNTER — Telehealth: Payer: Self-pay

## 2021-07-20 DIAGNOSIS — G6289 Other specified polyneuropathies: Secondary | ICD-10-CM

## 2021-07-20 NOTE — Telephone Encounter (Signed)
Refer to previous referral notes new referral needed to meet time frame  ?

## 2021-07-27 ENCOUNTER — Telehealth: Payer: Self-pay

## 2021-07-27 NOTE — Telephone Encounter (Signed)
Referral to neurology was denied referral needs comment about the patients need for nerve conduction studies  ?

## 2021-07-28 NOTE — Telephone Encounter (Signed)
Referred to neurology for likely nerve conduction studies -Suspected peripheral neuropathy, likely related to diabetes versus lumbar spine - foot burning, decreased sensation. Worse with socks.  ?Better at night, persistent during day.  ? ?

## 2021-07-29 NOTE — Addendum Note (Signed)
Addended by: [name] R on: 07/29/2021 07:07 PM ? ? Modules accepted: Orders ? ?

## 2021-07-29 NOTE — Telephone Encounter (Signed)
Addendum placed on referral comments section ?

## 2021-07-29 NOTE — Telephone Encounter (Signed)
Can you amend the referral , I cannot access the encounter where you placed the referral to add these comments. ?

## 2021-08-18 DIAGNOSIS — G608 Other hereditary and idiopathic neuropathies: Secondary | ICD-10-CM | POA: Diagnosis not present

## 2021-08-24 ENCOUNTER — Other Ambulatory Visit: Payer: Self-pay | Admitting: Family Medicine

## 2021-08-24 DIAGNOSIS — E1165 Type 2 diabetes mellitus with hyperglycemia: Secondary | ICD-10-CM

## 2021-08-31 DIAGNOSIS — F3341 Major depressive disorder, recurrent, in partial remission: Secondary | ICD-10-CM | POA: Diagnosis not present

## 2021-09-07 DIAGNOSIS — H1031 Unspecified acute conjunctivitis, right eye: Secondary | ICD-10-CM | POA: Diagnosis not present

## 2021-09-07 DIAGNOSIS — J069 Acute upper respiratory infection, unspecified: Secondary | ICD-10-CM | POA: Diagnosis not present

## 2021-09-27 ENCOUNTER — Ambulatory Visit: Payer: Medicare Other | Admitting: Family Medicine

## 2021-10-04 ENCOUNTER — Encounter: Payer: Self-pay | Admitting: Family Medicine

## 2021-10-04 ENCOUNTER — Ambulatory Visit (INDEPENDENT_AMBULATORY_CARE_PROVIDER_SITE_OTHER): Payer: Medicare Other | Admitting: Family Medicine

## 2021-10-04 VITALS — BP 116/68 | HR 80 | Temp 98.2°F | Resp 16 | Ht 71.0 in | Wt 194.0 lb

## 2021-10-04 DIAGNOSIS — E785 Hyperlipidemia, unspecified: Secondary | ICD-10-CM

## 2021-10-04 DIAGNOSIS — E1165 Type 2 diabetes mellitus with hyperglycemia: Secondary | ICD-10-CM | POA: Diagnosis not present

## 2021-10-04 DIAGNOSIS — E162 Hypoglycemia, unspecified: Secondary | ICD-10-CM

## 2021-10-04 DIAGNOSIS — G6289 Other specified polyneuropathies: Secondary | ICD-10-CM

## 2021-10-04 DIAGNOSIS — I10 Essential (primary) hypertension: Secondary | ICD-10-CM

## 2021-10-04 LAB — COMPREHENSIVE METABOLIC PANEL
ALT: 26 U/L (ref 0–53)
AST: 19 U/L (ref 0–37)
Albumin: 4 g/dL (ref 3.5–5.2)
Alkaline Phosphatase: 60 U/L (ref 39–117)
BUN: 32 mg/dL — ABNORMAL HIGH (ref 6–23)
CO2: 25 mEq/L (ref 19–32)
Calcium: 10.3 mg/dL (ref 8.4–10.5)
Chloride: 101 mEq/L (ref 96–112)
Creatinine, Ser: 1.2 mg/dL (ref 0.40–1.50)
GFR: 60.52 mL/min (ref >60.00)
Glucose, Bld: 404 mg/dL — ABNORMAL HIGH (ref 70–99)
Potassium: 4.3 mEq/L (ref 3.5–5.1)
Sodium: 134 mEq/L — ABNORMAL LOW (ref 135–145)
Total Bilirubin: 0.4 mg/dL (ref 0.2–1.2)
Total Protein: 6.6 g/dL (ref 6.0–8.3)

## 2021-10-04 LAB — LIPID PANEL
Cholesterol: 185 mg/dL (ref 0–200)
HDL: 43.7 mg/dL (ref >39.00)
LDL Cholesterol: 108 mg/dL — ABNORMAL HIGH (ref 0–99)
NonHDL: 141.48
Total CHOL/HDL Ratio: 4
Triglycerides: 165 mg/dL — ABNORMAL HIGH (ref 0.0–149.0)
VLDL: 33 mg/dL (ref 0.0–40.0)

## 2021-10-04 LAB — HEMOGLOBIN A1C: Hgb A1c MFr Bld: 7.4 % — ABNORMAL HIGH (ref 4.6–6.5)

## 2021-10-04 MED ORDER — EMPAGLIFLOZIN 25 MG PO TABS: 25.0000 mg | ORAL_TABLET | Freq: Every day | ORAL | 1 refills | Status: DC

## 2021-10-04 MED ORDER — LISINOPRIL-HYDROCHLOROTHIAZIDE 20-25 MG PO TABS: 1.0000 | ORAL_TABLET | Freq: Every day | ORAL | 1 refills | Status: DC

## 2021-10-04 MED ORDER — GLIPIZIDE 10 MG PO TABS: ORAL_TABLET | ORAL | 1 refills | Status: DC

## 2021-10-04 MED ORDER — ATORVASTATIN CALCIUM 20 MG PO TABS: ORAL_TABLET | ORAL | 3 refills | Status: DC

## 2021-10-04 MED ORDER — METFORMIN HCL 500 MG PO TABS: 500.0000 mg | ORAL_TABLET | Freq: Three times a day (TID) | ORAL | 1 refills | Status: DC

## 2021-10-13 ENCOUNTER — Encounter: Payer: Self-pay | Admitting: Family Medicine

## 2021-10-13 ENCOUNTER — Other Ambulatory Visit: Payer: Self-pay | Admitting: Family Medicine

## 2021-10-14 ENCOUNTER — Telehealth: Payer: Self-pay | Admitting: Family Medicine

## 2021-10-14 ENCOUNTER — Other Ambulatory Visit: Payer: Self-pay

## 2021-10-14 DIAGNOSIS — E1165 Type 2 diabetes mellitus with hyperglycemia: Secondary | ICD-10-CM

## 2021-10-14 DIAGNOSIS — I10 Essential (primary) hypertension: Secondary | ICD-10-CM

## 2021-10-14 MED ORDER — LISINOPRIL-HYDROCHLOROTHIAZIDE 20-25 MG PO TABS: 1.0000 | ORAL_TABLET | Freq: Every day | ORAL | 0 refills | Status: DC

## 2021-10-14 MED ORDER — GLIPIZIDE 10 MG PO TABS: ORAL_TABLET | ORAL | 0 refills | Status: DC

## 2021-10-14 NOTE — Telephone Encounter (Signed)
Patient states although he has enjoyed the care of his PCP, Dr. Carlota Raspberry, he thinks it is time for a change. At this time he would like to transfer care to Dr. Cherlynn Kaiser. Is this okay with you both?

## 2021-10-14 NOTE — Telephone Encounter (Signed)
Sent in Rx with no refills he has an upcoming with his new PCP 12/01/21

## 2021-10-14 NOTE — Telephone Encounter (Signed)
Encourage patient to contact the pharmacy for refills or they can request refills through [facility]  (Please schedule appointment if patient has not been seen in over a year)    WHAT PHARMACY WOULD THEY LIKE THIS SENT TO: [facility] on [address] [phone]  MEDICATION NAME & DOSE: glipizide 10 mg, lisinopril-hydrochlorothiazide 20-25 mg  NOTES/COMMENTS FROM PATIENT: none      Front office please notify patient: It takes 48-72 hours to process rx refill requests Ask patient to call pharmacy to ensure rx is ready before heading there.

## 2021-10-14 NOTE — Telephone Encounter (Signed)
I am sorry to hear this. I am happy to discuss with him any concerns but understand if he would like to change and ok with me if so.

## 2021-10-16 ENCOUNTER — Other Ambulatory Visit: Payer: Self-pay | Admitting: Family Medicine

## 2021-10-16 DIAGNOSIS — G6289 Other specified polyneuropathies: Secondary | ICD-10-CM

## 2021-10-20 ENCOUNTER — Ambulatory Visit: Payer: Medicare Other | Admitting: Family Medicine

## 2021-10-25 ENCOUNTER — Ambulatory Visit: Payer: Medicare Other

## 2021-10-27 IMAGING — CR DG LUMBAR SPINE COMPLETE 4+V
5 series · 5 of 5 positions shown · non-contrast
Comparison: CT abdomen and pelvis including bony reformats April 13, 2020

CLINICAL DATA: Low back pain.  Prior fall

EXAM:
LUMBAR SPINE - COMPLETE 4+ VIEW

[t l-spine a.p.]
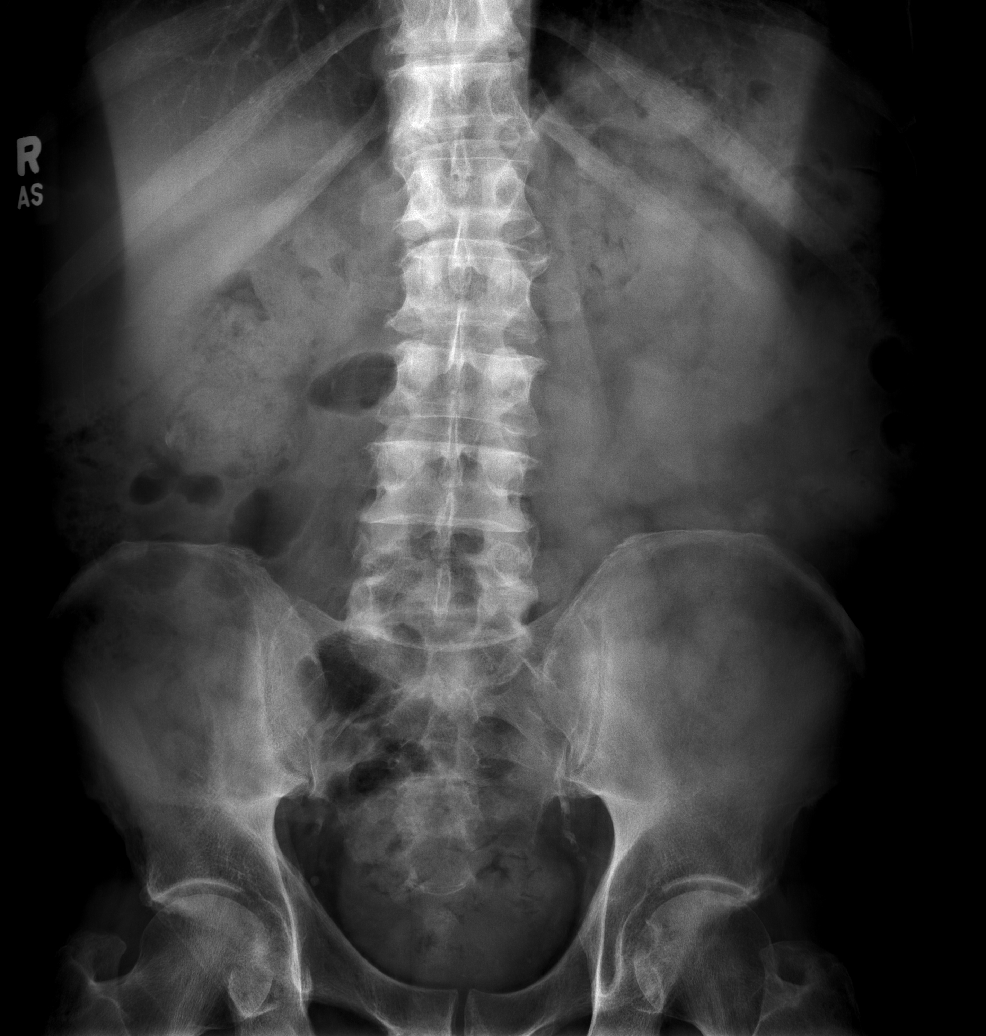

[t l-spine oblique exposure (1 of 2)]
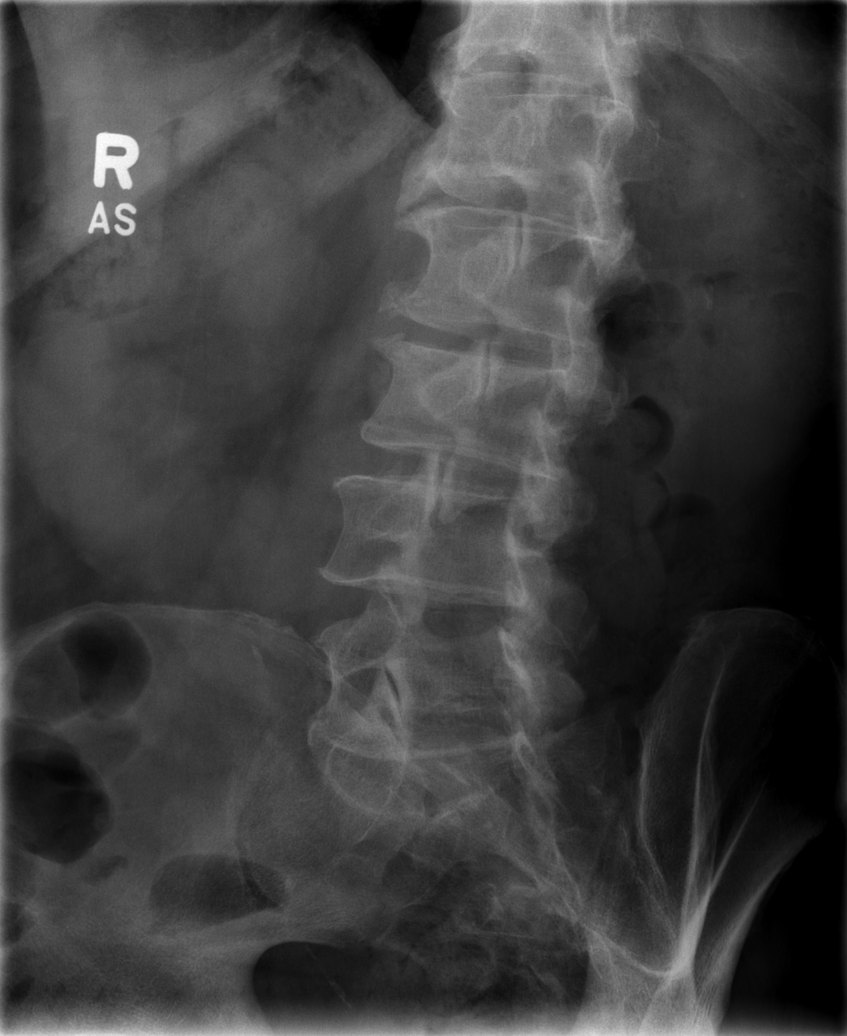

[t l-spine oblique exposure (2 of 2)]
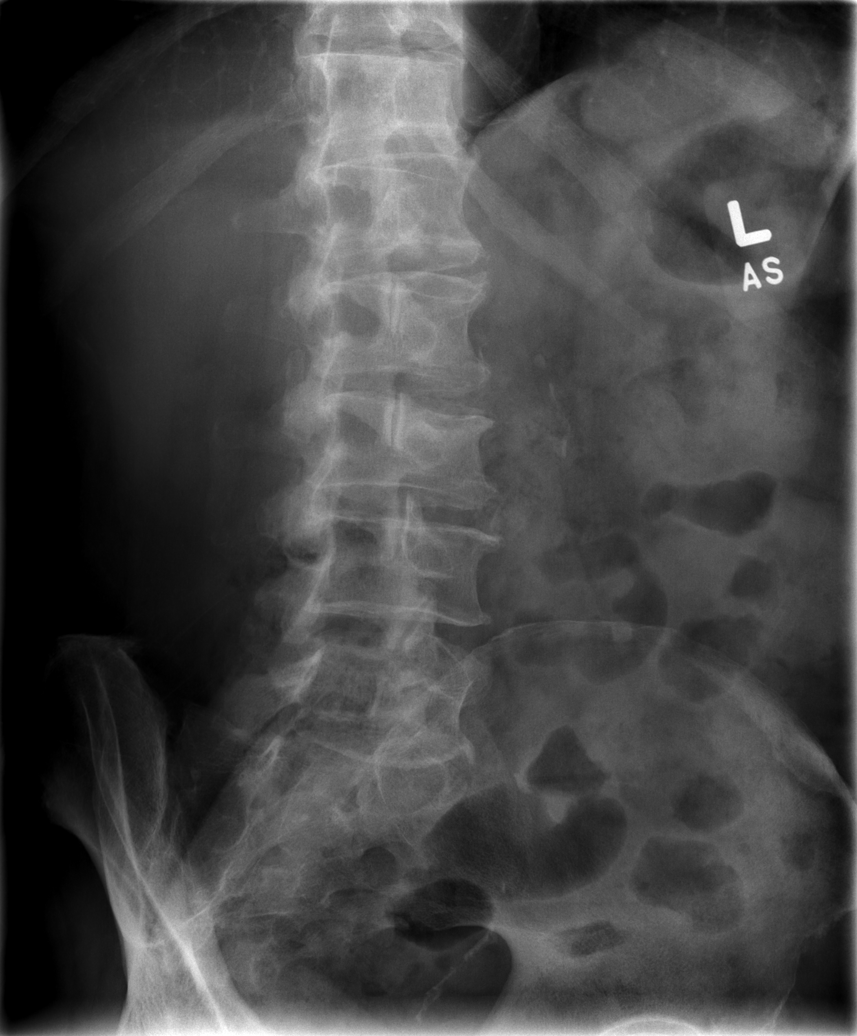

[t l-spine lat]
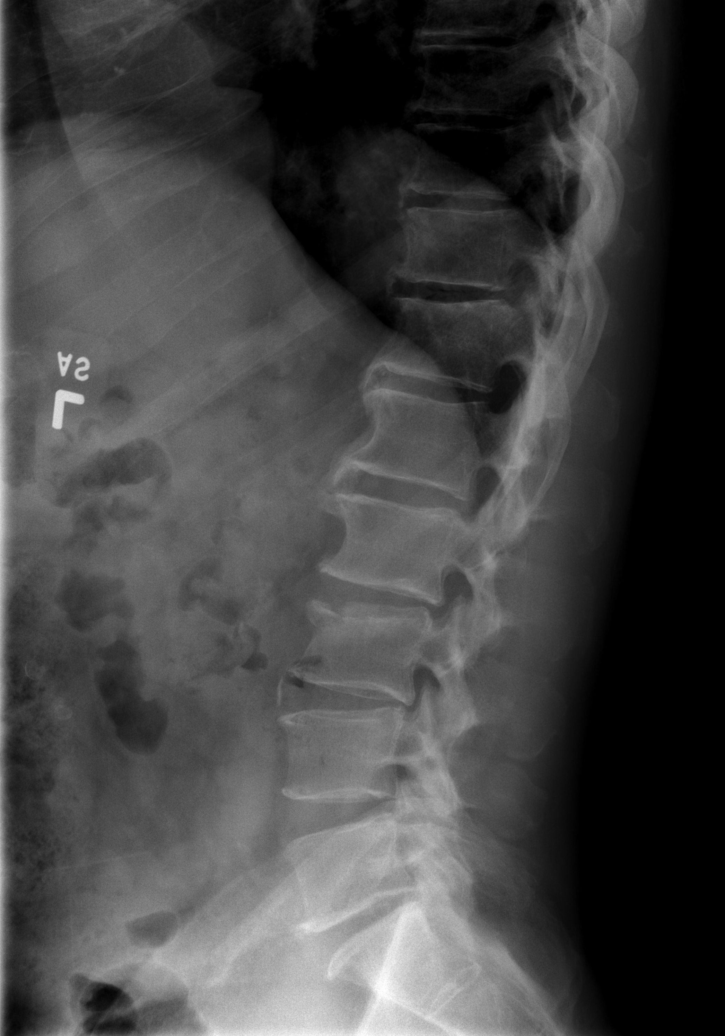

[t l-spine l5-s1 spot]
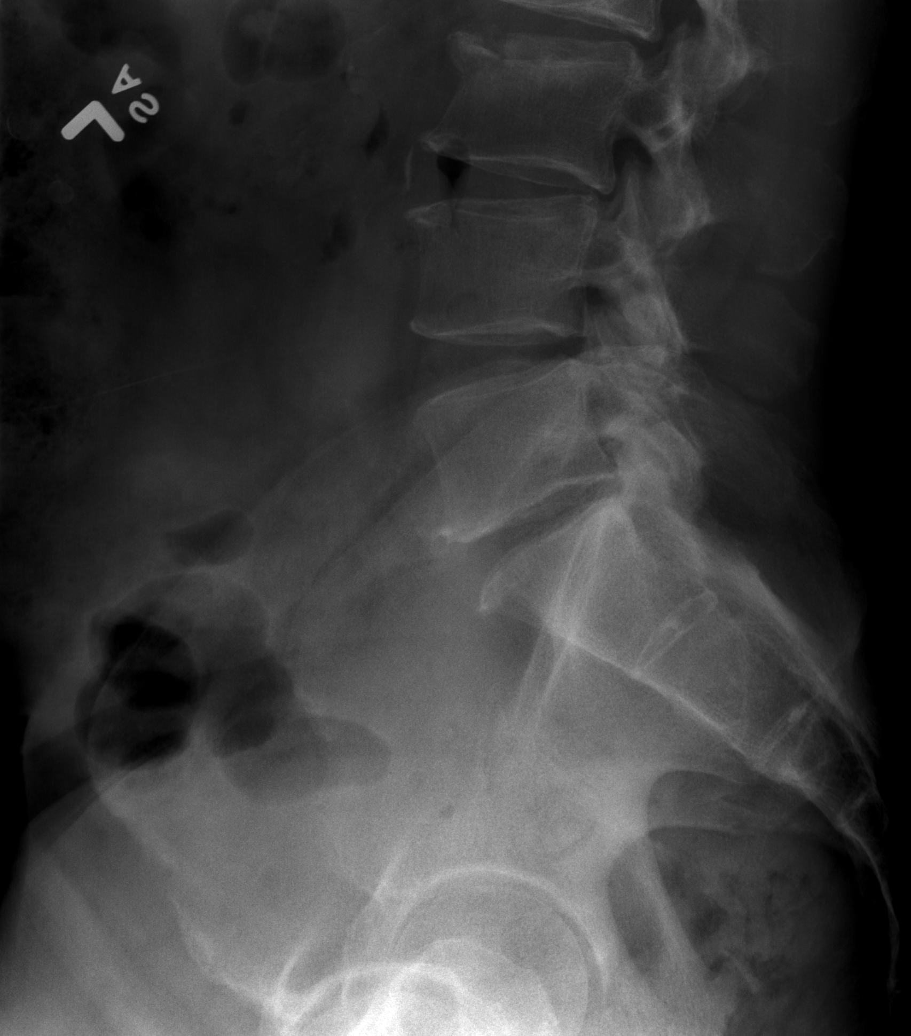

[5 of 5 positions shown; findings below may reference images not displayed]

FINDINGS: Frontal, lateral, spot lumbosacral lateral, and bilateral oblique
views were obtained. There are 5 non-rib-bearing lumbar type
vertebral bodies. There is thoracolumbar levoscoliosis. There is a
recent appearing fracture of the anterior superior endplate of the
L3 vertebral body with mild impaction in this area. No other
fracture. No spondylolisthesis. There is mild disc space narrowing
at L2-3, L3-4, and L5-S1. There is facet osteoarthritic change at
L4-5 and L5-S1 bilaterally.
IMPRESSION: Recent appearing fracture of the anterior superior endplate of the
L3 vertebral body with mild impaction in this area. This fracture
was not present on prior CT. No other fracture. No
spondylolisthesis. Osteoarthritic change noted at several levels.

These results will be called to the ordering clinician or
representative by the Radiologist Assistant, and communication
documented in the PACS or [REDACTED].

## 2021-10-28 ENCOUNTER — Ambulatory Visit: Payer: Medicare Other

## 2021-11-10 ENCOUNTER — Ambulatory Visit: Payer: Medicare Other | Admitting: Family Medicine

## 2021-11-11 ENCOUNTER — Encounter: Payer: Self-pay | Admitting: Family Medicine

## 2021-11-11 ENCOUNTER — Ambulatory Visit (INDEPENDENT_AMBULATORY_CARE_PROVIDER_SITE_OTHER): Payer: Medicare Other | Admitting: Family Medicine

## 2021-11-11 VITALS — BP 124/60 | HR 92 | Temp 98.4°F | Ht 71.0 in | Wt 193.2 lb

## 2021-11-11 DIAGNOSIS — G6289 Other specified polyneuropathies: Secondary | ICD-10-CM | POA: Diagnosis not present

## 2021-11-11 NOTE — Patient Instructions (Signed)
It was very nice to see you today!  Keep up the great work   PLEASE NOTE:  If you had any lab tests please let us know if you have not heard back within a few days. You may see your results on MyChart before we have a chance to review them but we will give you a call once they are reviewed by us. If we ordered any referrals today, please let us know if you have not heard from their office within the next week.   Please try these tips to maintain a healthy lifestyle:  Eat most of your calories during the day when you are active. Eliminate processed foods including packaged sweets (pies, cakes, cookies), reduce intake of potatoes, white bread, white pasta, and white rice. Look for whole grain options, oat flour or almond flour.  Each meal should contain half fruits/vegetables, one quarter protein, and one quarter carbs (no bigger than a computer mouse).  Cut down on sweet beverages. This includes juice, soda, and sweet tea. Also watch fruit intake, though this is a healthier sweet option, it still contains natural sugar! Limit to 3 servings daily.  Drink at least 1 glass of water with each meal and aim for at least 8 glasses per day  Exercise at least 150 minutes every week.   

## 2021-11-11 NOTE — Telephone Encounter (Signed)
I see MR of abdomen but these would show the same for Dr Cherlynn Kaiser as were all part of the [facility] system she should be able to see the same things we can

## 2021-11-11 NOTE — Progress Notes (Signed)
Subjective:     Patient ID: Andrew Tanner, male    DOB: February 03, 1951, 71 y.o.   MRN: [id number]  Chief Complaint  Patient presents with   Transfer of Care    Discuss peripheral neuropathy, feet get cold, having tingling and burning     HPI TOC Dr. Carlota Raspberry  Neuropathy-Oct 2022-woke up and numbness in toes.  Saw PCP-monofiliament-dec sens.  Saw vascular, then NCS at [facility].  Then [facility]-fist appt in sept.  Feet were cold all the time.  If warm socks, then feet felt like burning.  Got Tens-helps some and infrared light.  Using foot pedal bike.  Seeing acupuncturist-helps.  Wants referral to [place] [facility] therapy.  Olivia Mackie for insurance to cover  Lyrica helps some. Gapapentin-made him fall.    A1C has been in the 7's.  Occ spot check >300.  Using watch app and calibrating so he can monitor better.  No lows.  B12 >1500.  Feet no changing colors. Pain at night esp.  If feet feel cold and moves spine forward, feels better. Has seen spine surgeon and told "back mess from scar tissue"MRI in May at ortho.   Health Maintenance Due  Topic Date Due   OPHTHALMOLOGY EXAM  08/08/2020    Past Medical History:  Diagnosis Date   Anxiety    Cancer ([place])    basal cell skin CA   Cataract    Depression    Diabetes mellitus without complication (HCC)    Hyperlipidemia    Hypertension    IC (interstitial cystitis)    Neuromuscular disorder ([place]) 01/09/21   Peripheral  Neuropathy    Past Surgical History:  Procedure Laterality Date   CATARACT EXTRACTION, BILATERAL     HERNIA REPAIR N/A    inguinal   NECK SURGERY     ant/post   SPINE SURGERY     TRANSURETHRAL RESECTION OF PROSTATE     TURP VAPORIZATION      Outpatient Medications Prior to Visit  Medication Sig Dispense Refill   alfuzosin (UROXATRAL) 10 MG 24 hr tablet Take 1 tablet by mouth once daily with breakfast 90 tablet 0   atorvastatin (LIPITOR) 20 MG tablet TAKE 1 TABLET BY MOUTH ONCE DAILY AT  6  PM 90 tablet 3   clonazePAM  (KLONOPIN) 0.5 MG tablet Take 0.5 mg by mouth 3 (three) times daily as needed.     empagliflozin (JARDIANCE) 25 MG TABS tablet Take 1 tablet (25 mg total) by mouth daily before breakfast. 90 tablet 1   FLUoxetine (PROZAC) 40 MG capsule Take 40 mg by mouth daily. Take one table daily     glipiZIDE (GLUCOTROL) 10 MG tablet TAKE 2 TABLETS BY MOUTH TWICE DAILY BEFORE A MEAL 120 tablet 0   glucose blood test strip 1 each by Other route daily. Use as instructed checking once per day, or if new symptoms. 50 each 8   hydrocortisone 2.5 % ointment APPLY TO AFFECTED AREA EVERYDAY AT BEDTIME 29 g 0   hydrOXYzine (ATARAX/VISTARIL) 50 MG tablet Take 50 mg by mouth at bedtime.     ibuprofen (ADVIL,MOTRIN) 800 MG tablet Take 1 tablet (800 mg total) by mouth every 8 (eight) hours as needed for pain. 30 tablet 0   lisinopril-hydrochlorothiazide (ZESTORETIC) 20-25 MG tablet Take 1 tablet by mouth daily. 60 tablet 0   metFORMIN (GLUCOPHAGE) 500 MG tablet Take 1 tablet (500 mg total) by mouth 3 (three) times daily. 270 tablet 1   pregabalin (LYRICA) 100 MG capsule  TAKE 1 CAPSULE BY MOUTH THREE TIMES DAILY 90 capsule 0   escitalopram (LEXAPRO) 10 MG tablet Take 10 mg by mouth daily.     diclofenac (VOLTAREN) 75 MG EC tablet Take 1 tablet (75 mg total) by mouth 2 (two) times daily as needed. (Patient not taking: Reported on 03/31/2021) 30 tablet 0   methocarbamol (ROBAXIN) 500 MG tablet Take 1 tablet (500 mg total) by mouth 2 (two) times daily as needed for muscle spasms. 30 tablet 0   Misc Natural Products (PUMPKIN SEED OIL PO) Take by mouth.     Turmeric (QC TUMERIC COMPLEX PO) Take by mouth.     No facility-administered medications prior to visit.    No Known Allergies ROS neg/noncontributory except as noted HPI/below Not depressed.  Enjoys Film/video editor.       Objective:     BP 124/60   Pulse 92   Temp 98.4 F (36.9 C) (Temporal)   Ht '5\' 11"'$  (1.803 m)   Wt 193 lb 4 oz (87.7 kg)   SpO2 97%    BMI 26.95 kg/m  Wt Readings from Last 3 Encounters:  11/11/21 193 lb 4 oz (87.7 kg)  10/04/21 194 lb (88 kg)  07/15/21 184 lb (83.5 kg)    Physical Exam   Gen: WDWN NAD HEENT: NCAT, conjunctiva not injected, sclera nonicteric NECK:  supple, no thyromegaly, no nodes, no carotid bruits CARDIAC: RRR, S1S2+, no murmur. DP 2+B EXT:  no edema MSK: no gross abnormalities.  NEURO: A&O x3.  CN II-XII intact.  PSYCH: normal mood. Good eye contact  Reviewed labs    Assessment & Plan:   Problem List Items Addressed This Visit   None Visit Diagnoses     Other polyneuropathy    -  Primary      Polyneuropathy-has had EMG's, MRI, Vascular.  Seeing Neuro new pt Sept.  pt on lyrica, B12, tens,infrared light, exercises, acupuncture.  All help, but still issues.  Wants letter to see if ins will cover the acupuncture.  Discussed methyl B12 consideration.  Await neuro input.  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  F/u reg f/u in 2 mo.  Keep logs of DM-peaks, foods as gluc has been over 300 at times.   No orders of the defined types were placed in this encounter.   Wellington Hampshire, MD

## 2021-11-22 ENCOUNTER — Other Ambulatory Visit: Payer: Self-pay | Admitting: Family Medicine

## 2021-11-22 DIAGNOSIS — G6289 Other specified polyneuropathies: Secondary | ICD-10-CM

## 2021-11-22 NOTE — Telephone Encounter (Signed)
Patient needs medication refill for lyrica. Please Advise

## 2021-12-17 ENCOUNTER — Ambulatory Visit (INDEPENDENT_AMBULATORY_CARE_PROVIDER_SITE_OTHER): Payer: Medicare Other

## 2021-12-17 DIAGNOSIS — Z0000 Encounter for general adult medical examination without abnormal findings: Secondary | ICD-10-CM

## 2021-12-17 NOTE — Progress Notes (Signed)
Virtual Visit via Telephone Note  I connected with  Andrew Tanner on 12/17/21 at  8:00 AM EDT by telephone and verified that I am speaking with the correct person using two identifiers.  Medicare Annual Wellness visit completed telephonically due to Covid-19 pandemic.   Persons participating in this call: This Health Coach and this patient.   Location: Patient: home Provider: office    I discussed the limitations, risks, security and privacy concerns of performing an evaluation and management service by telephone and the availability of in person appointments. The patient expressed understanding and agreed to proceed.  Unable to perform video visit due to video visit attempted and failed and/or patient does not have video capability.   Some vital signs may be absent or patient reported.   Willette Brace, LPN   Subjective:   Andrew Tanner is a 71 y.o. male who presents for Medicare Annual/Subsequent preventive examination.  Review of Systems     Cardiac Risk Factors include: advanced age (>21mn, >>58women);diabetes mellitus;hypertension;dyslipidemia;male gender     Objective:    There were no vitals filed for this visit. There is no height or weight on file to calculate BMI.     12/17/2021    8:14 AM 10/19/2020    9:09 AM 01/17/2017    9:52 AM 12/22/2016    3:24 PM  Advanced Directives  Does Patient Have a Medical Advance Directive? Yes Yes No No  Type of [place] will    Copy of [place] Chart? No - copy requested No - copy requested    Would patient like information on creating a medical advance directive?   Yes (ED - Information included in AVS) No - Patient declined    Current Medications (verified) Outpatient Encounter Medications as of 12/17/2021  Medication Sig   alfuzosin (UROXATRAL) 10 MG 24 hr tablet Take 1 tablet by mouth once daily with  breakfast   atorvastatin (LIPITOR) 20 MG tablet TAKE 1 TABLET BY MOUTH ONCE DAILY AT  6  PM   clonazePAM (KLONOPIN) 0.5 MG tablet Take 0.5 mg by mouth 3 (three) times daily as needed.   empagliflozin (JARDIANCE) 25 MG TABS tablet Take 1 tablet (25 mg total) by mouth daily before breakfast.   FLUoxetine (PROZAC) 40 MG capsule Take 40 mg by mouth daily. Take one table daily   glipiZIDE (GLUCOTROL) 10 MG tablet TAKE 2 TABLETS BY MOUTH TWICE DAILY BEFORE A MEAL   glucose blood test strip 1 each by Other route daily. Use as instructed checking once per day, or if new symptoms.   hydrocortisone 2.5 % ointment APPLY TO AFFECTED AREA EVERYDAY AT BEDTIME   hydrOXYzine (ATARAX/VISTARIL) 50 MG tablet Take 50 mg by mouth at bedtime.   ibuprofen (ADVIL,MOTRIN) 800 MG tablet Take 1 tablet (800 mg total) by mouth every 8 (eight) hours as needed for pain.   lisinopril-hydrochlorothiazide (ZESTORETIC) 20-25 MG tablet Take 1 tablet by mouth daily.   metFORMIN (GLUCOPHAGE) 500 MG tablet Take 1 tablet (500 mg total) by mouth 3 (three) times daily.   pregabalin (LYRICA) 100 MG capsule TAKE 1 CAPSULE BY MOUTH THREE TIMES DAILY   No facility-administered encounter medications on file as of 12/17/2021.    Allergies (verified) Patient has no known allergies.   History: Past Medical History:  Diagnosis Date   Anxiety    Cancer ([place]    basal cell skin CA   Cataract  Depression    Diabetes mellitus without complication ([place])    Hyperlipidemia    Hypertension    IC (interstitial cystitis)    Neuromuscular disorder ([place]) 01/09/21   Peripheral  Neuropathy   Past Surgical History:  Procedure Laterality Date   CATARACT EXTRACTION, BILATERAL     HERNIA REPAIR N/A    inguinal   NECK SURGERY     ant/post   SPINE SURGERY     TRANSURETHRAL RESECTION OF PROSTATE     TURP VAPORIZATION     Family History  Problem Relation Age of Onset   Heart disease Father    Hyperlipidemia Father    Diabetes Father         type 1   Diabetes Brother    Depression Brother    Renal cancer Brother    Anxiety disorder Brother    Lung cancer Mother    Stomach cancer Neg Hx    Colon cancer Neg Hx    Pancreatic cancer Neg Hx    Esophageal cancer Neg Hx    Rectal cancer Neg Hx    Social History   Socioeconomic History   Marital status: Married    Spouse name: Andrew Tanner   Number of children: 1   Years of education: Not on file   Highest education level: Not on file  Occupational History   Not on file  Tobacco Use   Smoking status: Former    Packs/day: 1.00    Years: 5.00    Total pack years: 5.00    Types: Cigarettes    Quit date: 03/02/1982    Years since quitting: 39.8   Smokeless tobacco: Never   Tobacco comments:    quit 28 yrs ago  Vaping Use   Vaping Use: Never used  Substance and Sexual Activity   Alcohol use: Not Currently    Comment: none   Drug use: No   Sexual activity: Not Currently    Birth control/protection: Abstinence  Other Topics Concern   Not on file  Social History Narrative   Retired Animator   Social Determinants of Health   Financial Resource Strain: [place]  (12/17/2021)   Overall Financial Resource Strain (CARDIA)    Difficulty of Paying Living Expenses: Not hard at all  Food Insecurity: No Food Insecurity (12/17/2021)   Hunger Vital Sign    Worried About Running Out of Food in the Last Year: Never true    [place] in the Last Year: Never true  Transportation Needs: No Transportation Needs (12/17/2021)   PRAPARE - Hydrologist (Medical): No    Lack of Transportation (Non-Medical): No  Physical Activity: Sufficiently Active (12/17/2021)   Exercise Vital Sign    Days of Exercise per Week: 7 days    Minutes of Exercise per Session: 90 min  Stress: No Stress Concern Present (12/17/2021)   [place]    Feeling of Stress : Not at all   Social Connections: [place] (12/17/2021)   Social Connection and Isolation Panel [NHANES]    Frequency of Communication with Friends and Family: More than three times a week    Frequency of Social Gatherings with Friends and Family: More than three times a week    Attends Religious Services: More than 4 times per year    Active Member of [employer] or Organizations: Yes    Attends Archivist Meetings: 1  to 4 times per year    Marital Status: Married    Tobacco Counseling Counseling given: Not Answered Tobacco comments: quit 28 yrs ago   Clinical Intake:  Pre-visit preparation completed: Yes  Pain : No/denies pain     BMI - recorded: 29.96 Nutritional Status: BMI 25 -29 Overweight Nutritional Risks: None Diabetes: No  How often do you need to have someone help you when you read instructions, pamphlets, or other written materials from your doctor or pharmacy?: 1 - Never  Diabetic?Nutrition Risk Assessment:  Has the patient had any N/V/D within the last 2 months?  No  Does the patient have any non-healing wounds?  No  Has the patient had any unintentional weight loss or weight gain?  No   Diabetes:  Is the patient diabetic?  Yes  If diabetic, was a CBG obtained today?  No  Did the patient bring in their glucometer from home?  No  How often do you monitor your CBG's? N/A.   Financial Strains and Diabetes Management:  Are you having any financial strains with the device, your supplies or your medication? No .  Does the patient want to be seen by Chronic Care Management for management of their diabetes?  No  Would the patient like to be referred to a Nutritionist or for Diabetic Management?  No   Diabetic Exams:  Diabetic Eye Exam: Overdue for diabetic eye exam. Pt has been advised about the importance in completing this exam. Patient advised to call and schedule an eye exam. Diabetic Foot Exam: Completed 10/04/21   Interpreter Needed?:  No  Information entered by :: Charlott Rakes, LPN   Activities of Daily Living    12/17/2021    8:16 AM  In your present state of health, do you have any difficulty performing the following activities:  Hearing? 0  Vision? 0  Difficulty concentrating or making decisions? 0  Walking or climbing stairs? 0  Dressing or bathing? 0  Doing errands, shopping? 0  Preparing Food and eating ? N  Using the Toilet? N  In the past six months, have you accidently leaked urine? N  Do you have problems with loss of bowel control? N  Managing your Medications? N  Managing your Finances? N  Housekeeping or managing your Housekeeping? N    Patient Care Team: Tawnya Crook, MD as PCP - General (Family Medicine) Thalia Bloodgood, [place] as Referring Physician (Optometry)  Indicate any recent Medical Services you may have received from other than [facility] providers in the past year (date may be approximate).     Assessment:   This is a routine wellness examination for Andrew Tanner.  Hearing/Vision screen Hearing Screening - Comments:: Pt denies any hearing issues  Vision Screening - Comments:: Pt follows up with my eye dr in [place]   Dietary issues and exercise activities discussed: Current Exercise Habits: Home exercise routine, Type of exercise: Other - see comments;walking, Time (Minutes): > 60, Frequency (Times/Week): 7, Weekly Exercise (Minutes/Week): 0   Goals Addressed             This Visit's Progress    Patient Stated       Improve quality of exercising        Depression Screen    12/17/2021    8:13 AM 10/04/2021   10:04 AM 07/15/2021   11:10 AM 05/10/2021   10:18 AM 03/31/2021    2:42 PM 02/01/2021    2:14 PM 12/21/2020    2:29 PM  PHQ 2/9 Scores  PHQ - 2 Score 2 0 0 0 0 2 1  PHQ- 9 Score 2  0 0 1 2     Fall Risk    12/17/2021    8:15 AM 11/11/2021    8:59 AM 10/04/2021   10:04 AM 07/15/2021   11:10 AM 05/10/2021   10:18 AM  Fall Risk   Falls in the past year? 1 1 0 1 0   Number falls in past yr: 0 0 0 0 0  Injury with Fall? 0 0 0 0 0  Risk for fall due to : Impaired balance/gait Impaired balance/gait No Fall Risks No Fall Risks No Fall Risks  Follow up Falls prevention discussed Falls prevention discussed Falls evaluation completed Falls evaluation completed Falls evaluation completed    Severance:  Any stairs in or around the home? Yes  If so, are there any without handrails? No  Home free of loose throw rugs in walkways, pet beds, electrical cords, etc? Yes  Adequate lighting in your home to reduce risk of falls? Yes   ASSISTIVE DEVICES UTILIZED TO PREVENT FALLS:  Life alert? No  Use of a cane, walker or w/c? No  Grab bars in the bathroom? No  Shower chair or bench in shower? No  Elevated toilet seat or a handicapped toilet? No   TIMED UP AND GO:  Was the test performed? No .   Cognitive Function:        12/17/2021    8:18 AM  6CIT Screen  What Year? 0 points  What month? 0 points  What time? 0 points  Count back from 20 0 points  Months in reverse 0 points  Repeat phrase 0 points  Total Score 0 points    Immunizations Immunization History  Administered Date(s) Administered   Fluad Quad(high Dose 65+) 12/31/2018, 01/20/2020, 12/21/2020   Influenza Split 01/13/2011   Influenza, High Dose Seasonal PF 01/01/2018   Influenza,inj,Quad PF,6+ Mos 02/21/2013, 03/10/2014, 02/16/2017   Moderna Sars-Covid-2 Vaccination 05/20/2019, 06/17/2019   Pneumococcal Conjugate-13 02/16/2017   Pneumococcal Polysaccharide-23 12/31/2018   Tdap 11/22/2012    TDAP status: Up to date  Flu Vaccine status: Up to date  Pneumococcal vaccine status: Up to date  Covid-19 vaccine status: Completed vaccines  Qualifies for Shingles Vaccine? No   Zostavax completed No   Shingrix Completed?: No.    Education has been provided regarding the importance of this vaccine. Patient has been advised to call insurance company to  determine out of pocket expense if they have not yet received this vaccine. Advised may also receive vaccine at local pharmacy or Health Dept. Verbalized acceptance and understanding.  Screening Tests Health Maintenance  Topic Date Due   OPHTHALMOLOGY EXAM  08/08/2020   Zoster Vaccines- Shingrix (1 of 2) 01/04/2022 (Originally 07/02/1999)   INFLUENZA VACCINE  01/19/2022 (Originally 11/09/2021)   COVID-19 Vaccine (3 - Moderna series) 01/25/2022 (Originally 08/12/2019)   HEMOGLOBIN A1C  04/05/2022   FOOT EXAM  10/05/2022   TETANUS/TDAP  11/23/2022   Fecal DNA (Cologuard)  02/05/2023   Pneumonia Vaccine 53+ Years old  Completed   Hepatitis C Screening  Completed   HPV VACCINES  Aged Out   COLONOSCOPY (Pts 45-47yr Insurance coverage will need to be confirmed)  Discontinued    Health Maintenance  Health Maintenance Due  Topic Date Due   OPHTHALMOLOGY EXAM  08/08/2020    Colorectal cancer screening: Type of screening: Cologuard. Completed 02/05/20. Repeat  every 3 years  Additional Screening:  Hepatitis C Screening:  Completed 01/01/18  Vision Screening: Recommended annual ophthalmology exams for early detection of glaucoma and other disorders of the eye. Is the patient up to date with their annual eye exam?  No  Who is the provider or what is the name of the office in which the patient attends annual eye exams? [facility]  If pt is not established with a provider, would they like to be referred to a provider to establish care? No .   Dental Screening: Recommended annual dental exams for proper oral hygiene  Community Resource Referral / Chronic Care Management: CRR required this visit?  No   CCM required this visit?  No      Plan:     I have personally reviewed and noted the following in the patient's chart:   Medical and social history Use of alcohol, tobacco or illicit drugs  Current medications and supplements including opioid prescriptions. Patient is not currently taking  opioid prescriptions. Functional ability and status Nutritional status Physical activity Advanced directives List of other physicians Hospitalizations, surgeries, and ER visits in previous 12 months Vitals Screenings to include cognitive, depression, and falls Referrals and appointments  In addition, I have reviewed and discussed with patient certain preventive protocols, quality metrics, and best practice recommendations. A written personalized care plan for preventive services as well as general preventive health recommendations were provided to patient.     Willette Brace, LPN   11/10/9560   Nurse Notes: none

## 2021-12-17 NOTE — Patient Instructions (Signed)
[name] , Thank you for taking time to come for your Medicare Wellness Visit. I appreciate your ongoing commitment to your health goals. Please review the following plan we discussed and let me know if I can assist you in the future.   Screening recommendations/referrals: Colonoscopy: cologuard 02/05/20 repeat every 3 years  Recommended yearly ophthalmology/optometry visit for glaucoma screening and checkup Recommended yearly dental visit for hygiene and checkup  Vaccinations: Influenza vaccine: done 12/21/20 repeat every year Pneumococcal vaccine: Up to date Tdap vaccine: done 11/22/12 repeat every 10 years  Shingles vaccine: Shingrix discussed. Please contact your pharmacy for coverage information.    Covid-19: completed 2/8, 06/17/19   Advanced directives: Please bring a copy of your health care power of attorney and living will to the office at your convenience.  Conditions/risks identified: continue to improve exercise quality   Next appointment: Follow up in one year for your annual wellness visit.   Preventive Care 13 Years and Older, Male Preventive care refers to lifestyle choices and visits with your health care provider that can promote health and wellness. What does preventive care include? A yearly physical exam. This is also called an annual well check. Dental exams once or twice a year. Routine eye exams. Ask your health care provider how often you should have your eyes checked. Personal lifestyle choices, including: Daily care of your teeth and gums. Regular physical activity. Eating a healthy diet. Avoiding tobacco and drug use. Limiting alcohol use. Practicing safe sex. Taking low doses of aspirin every day. Taking vitamin and mineral supplements as recommended by your health care provider. What happens during an annual well check? The services and screenings done by your health care provider during your annual well check will depend on your age, overall  health, lifestyle risk factors, and family history of disease. Counseling  Your health care provider may ask you questions about your: Alcohol use. Tobacco use. Drug use. Emotional well-being. Home and relationship well-being. Sexual activity. Eating habits. History of falls. Memory and ability to understand (cognition). Work and work Statistician. Screening  You may have the following tests or measurements: Height, weight, and BMI. Blood pressure. Lipid and cholesterol levels. These may be checked every 5 years, or more frequently if you are over 13 years old. Skin check. Lung cancer screening. You may have this screening every year starting at age 70 if you have a 30-pack-year history of smoking and currently smoke or have quit within the past 15 years. Fecal occult blood test (FOBT) of the stool. You may have this test every year starting at age 19. Flexible sigmoidoscopy or colonoscopy. You may have a sigmoidoscopy every 5 years or a colonoscopy every 10 years starting at age 28. Prostate cancer screening. Recommendations will vary depending on your family history and other risks. Hepatitis C blood test. Hepatitis B blood test. Sexually transmitted disease (STD) testing. Diabetes screening. This is done by checking your blood sugar (glucose) after you have not eaten for a while (fasting). You may have this done every 1-3 years. Abdominal aortic aneurysm (AAA) screening. You may need this if you are a current or former smoker. Osteoporosis. You may be screened starting at age 73 if you are at high risk. Talk with your health care provider about your test results, treatment options, and if necessary, the need for more tests. Vaccines  Your health care provider may recommend certain vaccines, such as: Influenza vaccine. This is recommended every year. Tetanus, diphtheria, and acellular pertussis (Tdap, Td)  vaccine. You may need a Td booster every 10 years. Zoster vaccine. You may  need this after age 49. Pneumococcal 13-valent conjugate (PCV13) vaccine. One dose is recommended after age 31. Pneumococcal polysaccharide (PPSV23) vaccine. One dose is recommended after age 65. Talk to your health care provider about which screenings and vaccines you need and how often you need them. This information is not intended to replace advice given to you by your health care provider. Make sure you discuss any questions you have with your health care provider. Document Released: 04/24/2015 Document Revised: 12/16/2015 Document Reviewed: 01/27/2015 Elsevier Interactive Patient Education  2017 [place] Prevention in the Home Falls can cause injuries. They can happen to people of all ages. There are many things you can do to make your home safe and to help prevent falls. What can I do on the outside of my home? Regularly fix the edges of walkways and driveways and fix any cracks. Remove anything that might make you trip as you walk through a door, such as a raised step or threshold. Trim any bushes or trees on the path to your home. Use bright outdoor lighting. Clear any walking paths of anything that might make someone trip, such as rocks or tools. Regularly check to see if handrails are loose or broken. Make sure that both sides of any steps have handrails. Any raised decks and porches should have guardrails on the edges. Have any leaves, snow, or ice cleared regularly. Use sand or salt on walking paths during winter. Clean up any spills in your garage right away. This includes oil or grease spills. What can I do in the bathroom? Use night lights. Install grab bars by the toilet and in the tub and shower. Do not use towel bars as grab bars. Use non-skid mats or decals in the tub or shower. If you need to sit down in the shower, use a plastic, non-slip stool. Keep the floor dry. Clean up any water that spills on the floor as soon as it happens. Remove soap buildup in  the tub or shower regularly. Attach bath mats securely with double-sided non-slip rug tape. Do not have throw rugs and other things on the floor that can make you trip. What can I do in the bedroom? Use night lights. Make sure that you have a light by your bed that is easy to reach. Do not use any sheets or blankets that are too big for your bed. They should not hang down onto the floor. Have a firm chair that has side arms. You can use this for support while you get dressed. Do not have throw rugs and other things on the floor that can make you trip. What can I do in the kitchen? Clean up any spills right away. Avoid walking on wet floors. Keep items that you use a lot in easy-to-reach places. If you need to reach something above you, use a strong step stool that has a grab bar. Keep electrical cords out of the way. Do not use floor polish or wax that makes floors slippery. If you must use wax, use non-skid floor wax. Do not have throw rugs and other things on the floor that can make you trip. What can I do with my stairs? Do not leave any items on the stairs. Make sure that there are handrails on both sides of the stairs and use them. Fix handrails that are broken or loose. Make sure that handrails are as long  as the stairways. Check any carpeting to make sure that it is firmly attached to the stairs. Fix any carpet that is loose or worn. Avoid having throw rugs at the top or bottom of the stairs. If you do have throw rugs, attach them to the floor with carpet tape. Make sure that you have a light switch at the top of the stairs and the bottom of the stairs. If you do not have them, ask someone to add them for you. What else can I do to help prevent falls? Wear shoes that: Do not have high heels. Have rubber bottoms. Are comfortable and fit you well. Are closed at the toe. Do not wear sandals. If you use a stepladder: Make sure that it is fully opened. Do not climb a closed  stepladder. Make sure that both sides of the stepladder are locked into place. Ask someone to hold it for you, if possible. Clearly mark and make sure that you can see: Any grab bars or handrails. First and last steps. Where the edge of each step is. Use tools that help you move around (mobility aids) if they are needed. These include: Canes. Walkers. Scooters. Crutches. Turn on the lights when you go into a dark area. Replace any light bulbs as soon as they burn out. Set up your furniture so you have a clear path. Avoid moving your furniture around. If any of your floors are uneven, fix them. If there are any pets around you, be aware of where they are. Review your medicines with your doctor. Some medicines can make you feel dizzy. This can increase your chance of falling. Ask your doctor what other things that you can do to help prevent falls. This information is not intended to replace advice given to you by your health care provider. Make sure you discuss any questions you have with your health care provider. Document Released: 01/22/2009 Document Revised: 09/03/2015 Document Reviewed: 05/02/2014 Elsevier Interactive Patient Education  2017 [employer].

## 2021-12-20 ENCOUNTER — Encounter: Payer: Self-pay | Admitting: Diagnostic Neuroimaging

## 2021-12-20 ENCOUNTER — Ambulatory Visit: Payer: Medicare Other | Admitting: Diagnostic Neuroimaging

## 2021-12-20 ENCOUNTER — Telehealth: Payer: Self-pay | Admitting: Neurology

## 2021-12-20 VITALS — BP 120/74 | HR 84 | Ht 71.0 in | Wt 195.8 lb

## 2021-12-20 DIAGNOSIS — M79671 Pain in right foot: Secondary | ICD-10-CM

## 2021-12-20 DIAGNOSIS — M79672 Pain in left foot: Secondary | ICD-10-CM

## 2021-12-20 NOTE — Telephone Encounter (Signed)
Orders Placed This Encounter  Procedures   NCV with EMG(electromyography)      

## 2021-12-20 NOTE — Progress Notes (Signed)
[facility]  PATIENT: Andrew Tanner DOB: 07-Nov-1950  REFERRING CLINICIAN: [name], MD HISTORY FROM: patient REASON FOR VISIT: new consult   HISTORICAL  CHIEF COMPLAINT:  Chief Complaint  Patient presents with   Polyneuropathy    Rm 6 New Pt wife- Horris Latino "neuropathy in my feet since last Oct, pain/numbness/tingling is worsening, feel cold all the time, pain shoots up my legs"    HISTORY OF PRESENT ILLNESS:   71 year old male here for evaluation of numbness and feet.  History of cervical myelopathy status post surgery x2.  Also diabetes.  October 2022 patient noticed cold sensation in right foot now affecting left foot.  No problems with hands.  Had MRI of the cervical and thoracic spine which were unremarkable for any new findings.  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  Referred here for further evaluation.   REVIEW OF SYSTEMS: Full 14 system review of systems performed and negative with exception of: as per HPI.  ALLERGIES: No Known Allergies  HOME MEDICATIONS: Outpatient Medications Prior to Visit  Medication Sig Dispense Refill   alfuzosin (UROXATRAL) 10 MG 24 hr tablet Take 1 tablet by mouth once daily with breakfast 90 tablet 0   atorvastatin (LIPITOR) 20 MG tablet TAKE 1 TABLET BY MOUTH ONCE DAILY AT  6  PM 90 tablet 3   clonazePAM (KLONOPIN) 0.5 MG tablet Take 0.5 mg by mouth 3 (three) times daily as needed.     empagliflozin (JARDIANCE) 25 MG TABS tablet Take 1 tablet (25 mg total) by mouth daily before breakfast. 90 tablet 1   FLUoxetine (PROZAC) 20 MG capsule Take 60 mg by mouth every morning.     glipiZIDE (GLUCOTROL) 10 MG tablet TAKE 2 TABLETS BY MOUTH TWICE DAILY BEFORE A MEAL 120 tablet 0   glucose blood test strip 1 each by Other route daily. Use as instructed checking once per day, or if new symptoms. 50 each 8   hydrocortisone 2.5 % ointment APPLY TO AFFECTED AREA EVERYDAY  AT BEDTIME 29 g 0   hydrOXYzine (ATARAX/VISTARIL) 50 MG tablet Take 50 mg by mouth at bedtime.     ibuprofen (ADVIL,MOTRIN) 800 MG tablet Take 1 tablet (800 mg total) by mouth every 8 (eight) hours as needed for pain. 30 tablet 0   lisinopril-hydrochlorothiazide (ZESTORETIC) 20-25 MG tablet Take 1 tablet by mouth daily. 60 tablet 0   metFORMIN (GLUCOPHAGE) 500 MG tablet Take 1 tablet (500 mg total) by mouth 3 (three) times daily. 270 tablet 1   pregabalin (LYRICA) 100 MG capsule TAKE 1 CAPSULE BY MOUTH THREE TIMES DAILY 90 capsule 5   FLUoxetine (PROZAC) 40 MG capsule Take 40 mg by mouth daily. Take one table daily     No facility-administered medications prior to visit.    PAST MEDICAL HISTORY: Past Medical History:  Diagnosis Date   Anxiety    Cancer ([place])    basal cell skin CA   Cataract    Depression    Diabetes mellitus without complication ([place])    Hyperlipidemia    Hypertension    IC (interstitial cystitis)    Neuromuscular disorder ([place]) 01/09/21   Peripheral  Neuropathy    PAST SURGICAL HISTORY: Past Surgical History:  Procedure Laterality Date   CATARACT EXTRACTION, BILATERAL     HERNIA REPAIR N/A    inguinal   NECK SURGERY     ant/post   SPINE SURGERY     TRANSURETHRAL RESECTION OF PROSTATE  TURP VAPORIZATION      FAMILY HISTORY: Family History  Problem Relation Age of Onset   Lung cancer Mother    Heart disease Father    Hyperlipidemia Father    Diabetes Father        type 1   Diabetes Brother    Depression Brother    Renal cancer Brother    Anxiety disorder Brother    Stomach cancer Neg Hx    Colon cancer Neg Hx    Pancreatic cancer Neg Hx    Esophageal cancer Neg Hx    Rectal cancer Neg Hx     SOCIAL HISTORY: Social History   Socioeconomic History   Marital status: Married    Spouse name: [name]   Number of children: 1   Years of education: Not on file   Highest education level: Not on file  Occupational History   Not on  file  Tobacco Use   Smoking status: Former    Packs/day: 1.00    Years: 5.00    Total pack years: 5.00    Types: Cigarettes    Quit date: 03/02/1982    Years since quitting: 39.8   Smokeless tobacco: Never   Tobacco comments:    quit 28 yrs ago  Vaping Use   Vaping Use: Never used  Substance and Sexual Activity   Alcohol use: Not Currently    Comment: none   Drug use: No   Sexual activity: Not Currently    Birth control/protection: Abstinence  Other Topics Concern   Not on file  Social History Narrative   Retired Corporate treasurer, lives with wife   Biomedical engineer   Social Determinants of Health   Financial Resource Strain: [place]  (12/17/2021)   Overall Financial Resource Strain (CARDIA)    Difficulty of Paying Living Expenses: Not hard at all  Food Insecurity: No Food Insecurity (12/17/2021)   Hunger Vital Sign    Worried About Running Out of Food in the Last Year: Never true    [place] in the Last Year: Never true  Transportation Needs: No Transportation Needs (12/17/2021)   PRAPARE - Hydrologist (Medical): No    Lack of Transportation (Non-Medical): No  Physical Activity: Sufficiently Active (12/17/2021)   Exercise Vital Sign    Days of Exercise per Week: 7 days    Minutes of Exercise per Session: 90 min  Stress: No Stress Concern Present (12/17/2021)   [place]    Feeling of Stress : Not at all  Social Connections: [place] (12/17/2021)   Social Connection and Isolation Panel [NHANES]    Frequency of Communication with Friends and Family: More than three times a week    Frequency of Social Gatherings with Friends and Family: More than three times a week    Attends Religious Services: More than 4 times per year    Active Member of [employer] or Organizations: Yes    Attends Archivist Meetings: 1 to 4 times per year    Marital Status: Married   Human resources officer Violence: Not At Risk (12/17/2021)   Humiliation, Afraid, Rape, and Kick questionnaire    Fear of Current or Ex-Partner: No    Emotionally Abused: No    Physically Abused: No    Sexually Abused: No     PHYSICAL EXAM  GENERAL EXAM/CONSTITUTIONAL: Vitals:  Vitals:   12/20/21 1101  BP: 120/74  Pulse: 84  Weight: 195 lb 12.8 oz (88.8 kg)  Height: '5\' 11"'$  (1.803 m)   Body mass index is 27.31 kg/m. Wt Readings from Last 3 Encounters:  12/20/21 195 lb 12.8 oz (88.8 kg)  11/11/21 193 lb 4 oz (87.7 kg)  10/04/21 194 lb (88 kg)   Patient is in no distress; well developed, nourished and groomed; neck is supple  CARDIOVASCULAR: Examination of carotid arteries is normal; no carotid bruits Regular rate and rhythm, no murmurs Examination of peripheral vascular system by observation and palpation is normal  EYES: Ophthalmoscopic exam of optic discs and posterior segments is normal; no papilledema or hemorrhages No results found.  MUSCULOSKELETAL: Gait, strength, tone, movements noted in Neurologic exam below  NEUROLOGIC: MENTAL STATUS:      No data to display         awake, alert, oriented to person, place and time recent and remote memory intact normal attention and concentration language fluent, comprehension intact, naming intact fund of knowledge appropriate  CRANIAL NERVE:  2nd - no papilledema on fundoscopic exam 2nd, 3rd, 4th, 6th - pupils equal and reactive to light, visual fields full to confrontation, extraocular muscles intact, no nystagmus 5th - facial sensation symmetric 7th - facial strength symmetric 8th - hearing intact 9th - palate elevates symmetrically, uvula midline 11th - shoulder shrug symmetric 12th - tongue protrusion midline  MOTOR:  normal bulk and tone, full strength in the BUE, BLE  SENSORY:  normal and symmetric to light touch, temperature, vibration; DECR IN ANKLES, SHINS  COORDINATION:  finger-nose-finger, fine  finger movements normal  REFLEXES:  deep tendon reflexes TRACE and symmetric  GAIT/STATION:  narrow based gait     DIAGNOSTIC DATA (LABS, IMAGING, TESTING) - I reviewed patient records, labs, notes, testing and imaging myself where available.  Lab Results  Component Value Date   WBC 7.9 02/19/2020   HGB 16.1 02/19/2020   HCT 49.2 02/19/2020   MCV 91 02/19/2020   PLT 213 02/19/2020      Component Value Date/Time   NA 134 (L) 10/04/2021 1044   NA 134 01/20/2020 1053   K 4.3 10/04/2021 1044   CL 101 10/04/2021 1044   CO2 25 10/04/2021 1044   GLUCOSE 404 (H) 10/04/2021 1044   BUN 32 (H) 10/04/2021 1044   BUN 22 01/20/2020 1053   CREATININE 1.20 10/04/2021 1044   CREATININE 1.07 04/06/2021 0913   CALCIUM 10.3 10/04/2021 1044   PROT 6.6 10/04/2021 1044   PROT 6.5 01/20/2020 1053   ALBUMIN 4.0 10/04/2021 1044   ALBUMIN 4.4 01/20/2020 1053   AST 19 10/04/2021 1044   ALT 26 10/04/2021 1044   ALKPHOS 60 10/04/2021 1044   BILITOT 0.4 10/04/2021 1044   BILITOT 0.3 01/20/2020 1053   GFRNONAA 77 01/20/2020 1053   GFRNONAA >89 03/10/2014 1007   GFRAA 89 01/20/2020 1053   GFRAA >89 03/10/2014 1007   Lab Results  Component Value Date   CHOL 185 10/04/2021   HDL 43.70 10/04/2021   LDLCALC 108 (H) 10/04/2021   TRIG 165.0 (H) 10/04/2021   CHOLHDL 4 10/04/2021   Lab Results  Component Value Date   HGBA1C 7.4 (H) 10/04/2021   Lab Results  Component Value Date   VITAMINB12 >1504 (H) 07/15/2021   Lab Results  Component Value Date   TSH 1.24 07/15/2021    05/20/21 vascular ABI testing  Right: Resting right ankle-brachial index is within normal range. No  evidence of significant right lower extremity arterial disease. The right  toe-brachial index is normal.   Left: Resting left ankle-brachial index is within normal range. No  evidence of significant left lower extremity arterial disease. The left  toe-brachial index is normal.   08/18/21 EMG/NCS - axonal and  demyelinating motor neuropathy (normal sural responses; normal needle EMG)     ASSESSMENT AND PLAN  71 y.o. year old male here with:   Dx:  1. Pain in both feet      PLAN:  PAIN / COLDNESS / NEUROPATHY IN FEET / LEGS (axonal and demyelinating on outside study; likely related to diabetes)  - consider repeat EMG/NCS with Dr. Krista Blue (for CIDP vs multi-focal motor neuropathy evaluation)  - then consider additional labs  - then consider MRI lumbar spine (abnl motor studies; normal sensory responses; abnl sensory symptoms)  - continue lyrica  - consider lidocaine patch / cream, alpha-lipoic acid '600mg'$  daily  Return for pending if symptoms worsen or fail to improve, pending test results.    Penni Bombard, MD 7/86/7544, 92:01 AM Certified in Neurology, Neurophysiology and Neuroimaging  [facility] [address], [place], [place] [address] [phone]

## 2021-12-20 NOTE — Patient Instructions (Signed)
  PAIN / COLDNESS / NEUROPATHY IN FEET / LEGS (axonal and demyelinating on outside study; likely related to diabetes)  - consider repeat EMG/NCS with Dr. Krista Blue (for CIDP vs multi-focal motor neuropathy evaluation)  - then consider additional labs  - then consider MRI lumbar spine (abnl motor studies; normal sensory responses; abnl sensory symptoms)  - continue lyrica  - consider lidocaine patch / cream, alpha-lipoic acid '600mg'$  daily

## 2022-01-05 ENCOUNTER — Encounter: Payer: Self-pay | Admitting: Family Medicine

## 2022-01-05 ENCOUNTER — Ambulatory Visit: Payer: Medicare Other | Admitting: Family Medicine

## 2022-01-25 ENCOUNTER — Encounter: Payer: Self-pay | Admitting: Family Medicine

## 2022-01-25 ENCOUNTER — Ambulatory Visit (INDEPENDENT_AMBULATORY_CARE_PROVIDER_SITE_OTHER): Payer: Medicare Other | Admitting: Family Medicine

## 2022-01-25 VITALS — BP 120/70 | HR 71 | Temp 98.0°F | Ht 71.0 in | Wt 198.0 lb

## 2022-01-25 DIAGNOSIS — G629 Polyneuropathy, unspecified: Secondary | ICD-10-CM | POA: Insufficient documentation

## 2022-01-25 DIAGNOSIS — Z23 Encounter for immunization: Secondary | ICD-10-CM | POA: Diagnosis not present

## 2022-01-25 DIAGNOSIS — I10 Essential (primary) hypertension: Secondary | ICD-10-CM | POA: Diagnosis not present

## 2022-01-25 DIAGNOSIS — L219 Seborrheic dermatitis, unspecified: Secondary | ICD-10-CM | POA: Diagnosis not present

## 2022-01-25 DIAGNOSIS — E1165 Type 2 diabetes mellitus with hyperglycemia: Secondary | ICD-10-CM

## 2022-01-25 DIAGNOSIS — G6289 Other specified polyneuropathies: Secondary | ICD-10-CM

## 2022-01-25 MED ORDER — KETOCONAZOLE 2 % EX SHAM: 1.0000 | MEDICATED_SHAMPOO | CUTANEOUS | 3 refills | Status: DC

## 2022-01-25 NOTE — Patient Instructions (Addendum)
It was very nice to see you today! Check blood pressures monthly   PLEASE NOTE:  If you had any lab tests please let us know if you have not heard back within a few days. You may see your results on MyChart before we have a chance to review them but we will give you a call once they are reviewed by [place]. If we ordered any referrals today, please let us know if you have not heard from their office within the next week.   Please try these tips to maintain a healthy lifestyle:  Eat most of your calories during the day when you are active. Eliminate processed foods including packaged sweets (pies, cakes, cookies), reduce intake of potatoes, white bread, white pasta, and white rice. Look for whole grain options, oat flour or almond flour.  Each meal should contain half fruits/vegetables, one quarter protein, and one quarter carbs (no bigger than a computer mouse).  Cut down on sweet beverages. This includes juice, soda, and sweet tea. Also watch fruit intake, though this is a healthier sweet option, it still contains natural sugar! Limit to 3 servings daily.  Drink at least 1 glass of water with each meal and aim for at least 8 glasses per day  Exercise at least 150 minutes every week.

## 2022-01-25 NOTE — Progress Notes (Signed)
Subjective:     Patient ID: Andrew Tanner, male    DOB: 06/16/1950, 71 y.o.   MRN: [id number]  Chief Complaint  Patient presents with   Follow-up    2 month follow-up for dm and HTN Fasting      HPI  HTN-Pt is on lisinopril/hct 20/25  Bp's running-not checking.  No ha/dizziness/cp/palp/edema/cough/sob  DM type 2 w/neuropathy-on metformin 500tid, glipizide '10mg'$  bid, jardiance '25mg'$ .  Lipitor '20mg'$  and on ACE-I.  On Lyrica 100tid-doing acupuncture/laser, etc.  Foot exam done by [facility]-normal.  Using tens unit.  Taking NO and lipoic acid,B12.  Doing other alternative creams as well.   Sugar this am 125.  Occ 300.  Occ lows 1-2x.  Average in am 130's,  after lunch 160's.   Ophth May Perif neuropathy-saw neuro-getting NCS  There are no preventive care reminders to display for this patient.   Past Medical History:  Diagnosis Date   Anxiety    Cancer ([place])    basal cell skin CA   Cataract    Depression    Diabetes mellitus without complication ([place])    Hyperlipidemia    Hypertension    IC (interstitial cystitis)    Neuromuscular disorder ([place]) 01/09/21   Peripheral  Neuropathy    Past Surgical History:  Procedure Laterality Date   CATARACT EXTRACTION, BILATERAL     HERNIA REPAIR N/A    inguinal   NECK SURGERY     ant/post   SPINE SURGERY     TRANSURETHRAL RESECTION OF PROSTATE     TURP VAPORIZATION      Outpatient Medications Prior to Visit  Medication Sig Dispense Refill   alfuzosin (UROXATRAL) 10 MG 24 hr tablet Take 1 tablet by mouth once daily with breakfast 90 tablet 0   atorvastatin (LIPITOR) 20 MG tablet TAKE 1 TABLET BY MOUTH ONCE DAILY AT  6  PM 90 tablet 3   clonazePAM (KLONOPIN) 0.5 MG tablet Take 0.5 mg by mouth 3 (three) times daily as needed.     empagliflozin (JARDIANCE) 25 MG TABS tablet Take 1 tablet (25 mg total) by mouth daily before breakfast. 90 tablet 1   FLUoxetine (PROZAC) 20 MG capsule Take 60 mg by mouth every morning.     glipiZIDE  (GLUCOTROL) 10 MG tablet TAKE 2 TABLETS BY MOUTH TWICE DAILY BEFORE A MEAL 120 tablet 0   glucose blood test strip 1 each by Other route daily. Use as instructed checking once per day, or if new symptoms. 50 each 8   hydrocortisone 2.5 % ointment APPLY TO AFFECTED AREA EVERYDAY AT BEDTIME 29 g 0   hydrOXYzine (ATARAX/VISTARIL) 50 MG tablet Take 50 mg by mouth at bedtime.     ibuprofen (ADVIL,MOTRIN) 800 MG tablet Take 1 tablet (800 mg total) by mouth every 8 (eight) hours as needed for pain. 30 tablet 0   lisinopril-hydrochlorothiazide (ZESTORETIC) 20-25 MG tablet Take 1 tablet by mouth daily. 60 tablet 0   metFORMIN (GLUCOPHAGE) 500 MG tablet Take 1 tablet (500 mg total) by mouth 3 (three) times daily. 270 tablet 1   pregabalin (LYRICA) 100 MG capsule TAKE 1 CAPSULE BY MOUTH THREE TIMES DAILY 90 capsule 5   No facility-administered medications prior to visit.    No Known Allergies ROS neg/noncontributory except as noted HPI/below R bicep pain for long time.  Compression device not help.  More deltoid area. Was a pitcher in past. [place] pain.  Worse at night when sleeps on R.  No PT for this.  Flies worse.   Had IC for 1 yr and then resolved.       Objective:     BP 120/70   Pulse 71   Temp 98 F (36.7 C) (Temporal)   Ht '5\' 11"'$  (1.803 m)   Wt 198 lb (89.8 kg)   SpO2 97%   BMI 27.62 kg/m  Wt Readings from Last 3 Encounters:  01/25/22 198 lb (89.8 kg)  12/20/21 195 lb 12.8 oz (88.8 kg)  11/11/21 193 lb 4 oz (87.7 kg)    Physical Exam   Gen: WDWN NAD HEENT: NCAT, conjunctiva not injected, sclera nonicteric NECK:  supple, no thyromegaly, no nodes, no carotid bruits CARDIAC: RRR, S1S2+, no murmur. DP 2+B LUNGS: CTAB. No wheezes ABDOMEN:  BS+, soft, NTND, No HSM, no masses EXT:  no edema MSK: no gross abnormalities.  NEURO: A&O x3.  CN II-XII intact.  PSYCH: normal mood. Good eye contact Skin: seb derm eyebows, ears, beard     Assessment & Plan:   Problem List Items  Addressed This Visit       Cardiovascular and Mediastinum   Hypertension (Chronic)     Endocrine   Type 2 diabetes mellitus with hyperglycemia, without long-term current use of insulin (HCC) - Primary     Nervous and Auditory   Peripheral neuropathy     Musculoskeletal and Integument   Seborrheic dermatitis   Other Visit Diagnoses     Need for immunization against influenza       Relevant Orders   Flu Vaccine QUAD High Dose(Fluad) (Completed)      DM type 2 w/hyperglycemia, occ lows-chronic.  Stable.  Cont metformin '500mg'$  tid, glipizide '10mg'$  bid and jardiance '25mg'$ .  F/u 3 mo.  Bring copy ophth exam done in May.   HTN-chronic.  Controlled.  Cont lisinopril/hct 20/25.  Check monthly at home [place] neuropathy-prob DM, may be back/other.  Seeing neuro.  Ncs sch.  Doing a lot of alternative tx.  Cont Lyrica '100mg'$  tid Seb derm-chronic.  Ketoconazole shampoo 2x/wk  Meds ordered this encounter  Medications   ketoconazole (NIZORAL) 2 % shampoo    Sig: Apply 1 Application topically 2 (two) times a week.    Dispense:  120 mL    Refill:  3    Wellington Hampshire, MD

## 2022-02-10 ENCOUNTER — Ambulatory Visit: Payer: Medicare Other | Admitting: Neurology

## 2022-02-10 ENCOUNTER — Ambulatory Visit (INDEPENDENT_AMBULATORY_CARE_PROVIDER_SITE_OTHER): Payer: Medicare Other | Admitting: Neurology

## 2022-02-10 VITALS — BP 124/75 | HR 80 | Ht 71.0 in | Wt 198.0 lb

## 2022-02-10 DIAGNOSIS — M79672 Pain in left foot: Secondary | ICD-10-CM

## 2022-02-10 DIAGNOSIS — M79671 Pain in right foot: Secondary | ICD-10-CM

## 2022-02-10 DIAGNOSIS — G6289 Other specified polyneuropathies: Secondary | ICD-10-CM

## 2022-02-10 NOTE — Progress Notes (Signed)
ASSESSMENT AND PLAN  Andrew Tanner is a 71 y.o. male   Bilateral feet paresthesia History of cervical decompression, Long history of diabetes,  MRI of the cervical spine in 2013 described cord signal abnormality within C5-6,  EMG nerve conduction study today confirmed length-dependent peripheral neuropathy, with mixed axonal and mild demyelinating features, most likely related to his long history of diabetes,  His current complaints of bilateral feet paresthesia, most likely combination of diabetic peripheral neuropathy, with residual symptoms from previous cervical myelopathy ` Laboratory evaluation to complete neuropathy work-up  DIAGNOSTIC DATA (LABS, IMAGING, TESTING) - I reviewed patient records, labs, notes, testing and imaging myself where available. A1c June 2023 7.4, normal B12, TSH, triglyceride was elevated 165, LDL 108,  MEDICAL HISTORY:  Andrew Tanner is a 70 year old male, seen in request by Dr. Leta Baptist for EMG nerve conduction study, she is accompanied by his wife at today's visit  I reviewed and summarized the referring note.  Past medical history Cervical myelopathy, status post decompression, Diabetes, Hypertension Hyperlipidemia  Patient has a history of cervical decompression surgery around 2006, presenting with severe neck pain, stiffness in the neck, radiating paresthesia to upper extremity left worse than right, surgery did help him, he still have limited range of motion of his neck  He denies significant residual deficit otherwise, I personally reviewed MRI of cervical spine May 2013, evidence of cervical decompression surgery with metallic structure from Y6-T0, with artifact, evidence of C5-6 myelomalacia  He also has a long history of diabetes, A1c June 2023 was 7.4, lipid panel triglyceride 165, LDL 108, CMP, sodium 134, April 2023 showed normal TSH, B12   Since 2022, he noticed increased bilateral foot discomfort, especially right big toe,  stiff cold sensation, denies significant upper extremity paresthesia, denies change in his gait, denies bowel or bladder incontinence  EMG nerve conduction study today confirmed mild to moderate length-dependent peripheral neuropathy with mixed axonal and demyelinating features, this can be seen in patient with long history of diabetes  He has brisk reflex at bilateral upper extremity and patellar, residual findings from previous cervical myelopathy   PHYSICAL EXAM:   PHYSICAL EXAMNIATION:  Gen: NAD, conversant, well nourised, well groomed                     Cardiovascular: Regular rate rhythm, no peripheral edema, warm, nontender. Eyes: Conjunctivae clear without exudates or hemorrhage Neck: Supple, no carotid bruits. Pulmonary: Clear to auscultation bilaterally   NEUROLOGICAL EXAM:  MENTAL STATUS: Speech/cognition: Awake, alert, oriented to history taking and casual conversation CRANIAL NERVES: CN II: Visual fields are full to confrontation. Pupils are round equal and briskly reactive to light. CN III, IV, VI: extraocular movement are normal. No ptosis. CN V: Facial sensation is intact to light touch CN VII: Face is symmetric with normal eye closure  CN VIII: Hearing is normal to causal conversation. CN IX, X: Phonation is normal. CN XI: Head turning and shoulder shrug are intact  MOTOR: Mild bilateral toe extension flexion weakness  REFLEXES: Reflexes are 2+ and symmetric at the biceps, triceps, knees, and absent at ankles. Plantar responses are flexor.  SENSORY: Decreased vibratory sensation to ankle level, mild to length-dependent decreased pinprick  COORDINATION: There is no trunk or limb dysmetria noted.  GAIT/STANCE: Steady gait, mild difficulty standing up on tiptoes and heels, mild positive Romberg signs,  REVIEW OF SYSTEMS:  Full 14 system review of systems performed and notable only for as above All other review  of systems were negative.   ALLERGIES: No  Known Allergies  HOME MEDICATIONS: Current Outpatient Medications  Medication Sig Dispense Refill   alfuzosin (UROXATRAL) 10 MG 24 hr tablet Take 1 tablet by mouth once daily with breakfast 90 tablet 0   atorvastatin (LIPITOR) 20 MG tablet TAKE 1 TABLET BY MOUTH ONCE DAILY AT  6  PM 90 tablet 3   clonazePAM (KLONOPIN) 0.5 MG tablet Take 0.5 mg by mouth 3 (three) times daily as needed.     empagliflozin (JARDIANCE) 25 MG TABS tablet Take 1 tablet (25 mg total) by mouth daily before breakfast. 90 tablet 1   FLUoxetine (PROZAC) 20 MG capsule Take 60 mg by mouth every morning.     glipiZIDE (GLUCOTROL) 10 MG tablet TAKE 2 TABLETS BY MOUTH TWICE DAILY BEFORE A MEAL 120 tablet 0   glucose blood test strip 1 each by Other route daily. Use as instructed checking once per day, or if new symptoms. 50 each 8   hydrocortisone 2.5 % ointment APPLY TO AFFECTED AREA EVERYDAY AT BEDTIME 29 g 0   hydrOXYzine (ATARAX/VISTARIL) 50 MG tablet Take 50 mg by mouth at bedtime.     ibuprofen (ADVIL,MOTRIN) 800 MG tablet Take 1 tablet (800 mg total) by mouth every 8 (eight) hours as needed for pain. 30 tablet 0   ketoconazole (NIZORAL) 2 % shampoo Apply 1 Application topically 2 (two) times a week. 120 mL 3   lisinopril-hydrochlorothiazide (ZESTORETIC) 20-25 MG tablet Take 1 tablet by mouth daily. 60 tablet 0   metFORMIN (GLUCOPHAGE) 500 MG tablet Take 1 tablet (500 mg total) by mouth 3 (three) times daily. 270 tablet 1   pregabalin (LYRICA) 100 MG capsule TAKE 1 CAPSULE BY MOUTH THREE TIMES DAILY 90 capsule 5   No current facility-administered medications for this visit.    PAST MEDICAL HISTORY: Past Medical History:  Diagnosis Date   Anxiety    Cancer ([place])    basal cell skin CA   Cataract    Depression    Diabetes mellitus without complication ([place])    Hyperlipidemia    Hypertension    IC (interstitial cystitis)    Neuromuscular disorder ([place]) 01/09/21   Peripheral  Neuropathy    PAST SURGICAL  HISTORY: Past Surgical History:  Procedure Laterality Date   CATARACT EXTRACTION, BILATERAL     HERNIA REPAIR N/A    inguinal   NECK SURGERY     ant/post   SPINE SURGERY     TRANSURETHRAL RESECTION OF PROSTATE     TURP VAPORIZATION      FAMILY HISTORY: Family History  Problem Relation Age of Onset   Lung cancer Mother    Heart disease Father    Hyperlipidemia Father    Diabetes Father        type 1   Diabetes Brother    Depression Brother    Renal cancer Brother    Anxiety disorder Brother    Stomach cancer Neg Hx    Colon cancer Neg Hx    Pancreatic cancer Neg Hx    Esophageal cancer Neg Hx    Rectal cancer Neg Hx     SOCIAL HISTORY: Social History   Socioeconomic History   Marital status: Married    Spouse name: [name]   Number of children: 1   Years of education: Not on file   Highest education level: Not on file  Occupational History   Not on file  Tobacco Use   Smoking status: Former  Packs/day: 1.00    Years: 5.00    Total pack years: 5.00    Types: Cigarettes    Quit date: 03/02/1982    Years since quitting: 39.9   Smokeless tobacco: Never   Tobacco comments:    quit 28 yrs ago  Vaping Use   Vaping Use: Never used  Substance and Sexual Activity   Alcohol use: Not Currently    Comment: none   Drug use: No   Sexual activity: Not Currently    Birth control/protection: Abstinence  Other Topics Concern   Not on file  Social History Narrative   Retired Corporate treasurer, lives with wife   Biomedical engineer   Social Determinants of Health   Financial Resource Strain: [place]  (12/17/2021)   Overall Financial Resource Strain (CARDIA)    Difficulty of Paying Living Expenses: Not hard at all  Food Insecurity: No Food Insecurity (12/17/2021)   Hunger Vital Sign    Worried About Running Out of Food in the Last Year: Never true    Branch in the Last Year: Never true  Transportation Needs: No Transportation Needs (12/17/2021)    PRAPARE - Hydrologist (Medical): No    Lack of Transportation (Non-Medical): No  Physical Activity: Sufficiently Active (12/17/2021)   Exercise Vital Sign    Days of Exercise per Week: 7 days    Minutes of Exercise per Session: 90 min  Stress: No Stress Concern Present (12/17/2021)   [place]    Feeling of Stress : Not at all  Social Connections: [place] (12/17/2021)   Social Connection and Isolation Panel [NHANES]    Frequency of Communication with Friends and Family: More than three times a week    Frequency of Social Gatherings with Friends and Family: More than three times a week    Attends Religious Services: More than 4 times per year    Active Member of [employer] or Organizations: Yes    Attends Archivist Meetings: 1 to 4 times per year    Marital Status: Married  Human resources officer Violence: Not At Risk (12/17/2021)   Humiliation, Afraid, Rape, and Kick questionnaire    Fear of Current or Ex-Partner: No    Emotionally Abused: No    Physically Abused: No    Sexually Abused: No      Marcial Pacas, M.D. Ph.D.  [facility] [address], [place], [place] [address] Ph: [phone] Fax: [phone]  CC:  Tawnya Crook, MD [place],  [place] [address]  Tawnya Crook, MD

## 2022-02-10 NOTE — Patient Instructions (Signed)
Cymbalta 

## 2022-02-11 NOTE — Procedures (Signed)
Full Name: Andrew Tanner Gender: Male MRN #: [id number] Date of Birth: 1950-06-11    Visit Date: 02/10/2022 09:36 Age: 71 Years Examining Physician: Dr. Marcial Pacas Referring Physician: Dr. Andrey Spearman Height: 5 feet 11 inch History: 71 year old male with known history of diabetes, cervical decompression surgery presenting with a year history of bilateral toes paresthesia, right worse than left   Summary of the test:  Nerve conduction study: Right sural, superficial peroneal sensory responses were absent.  Right ulnar sensory both antidromic and orthodromic responses showed mildly prolonged peak latency with moderately decreased snap amplitude.  Right median sensory responses also showed mildly prolonged peak latency.  Right radial sensory responses showed mildly prolonged peak latency with normal snap amplitude.  Right tibial motor responses showed mildly prolonged distal latency, mildly decreased CMAP amplitude with slow conduction velocity, right tibial F-wave latency was moderately prolonged  Right peroneal to EDB motor responses showed normal distal latency, CMAP amplitude, moderately decreased conduction velocity.  Right ulnar motor responses showed normal distal latency, CMAP amplitude, with mild slowed conduction velocity  Right median motor responses showed mildly prolonged distal latency, mild slowed conduction velocity  Electromyography: Selected needle examinations were performed at right upper, lower extremity muscles; right cervical and lumbosacral paraspinal muscles.  There is evidence of chronic neuropathic changes involving the right C5, 6, 7 myotomes.  There was no spontaneous activity at right cervical paraspinal muscles, motor unit potential was mildly complex.  Needle examination of the right abductor pollicis brevis also showed chronic neuropathic changes, there was no significant active denervation.  Needle examination of right lower extremity  muscles showed mildly decreased recruitment patterns at the distal left muscles, there was no significant spontaneous activity.  Conclusion:  This is an abnormal study.  There is electrodiagnostic evidence of length-dependent sensorimotor neuropathy, with mixed axonal and demyelinating features.  This can be seen in patient with long history of diabetes.  In addition there is evidence of chronic right C5-6-7 cervical radiculopathy; moderate right carpal tunnel syndrome.   ------------------------------- Physician Name, M.D.  [facility] [address] [place], [place] [address] Tel: [phone] Fax: [phone]  Verbal informed consent was obtained from the patient, patient was informed of potential risk of procedure, including bruising, bleeding, hematoma formation, infection, muscle weakness, muscle pain, numbness, among others.        [place]    Nerve / Sites Muscle Latency Ref. Amplitude Ref. Rel Amp Segments Distance Velocity Ref. Area    ms ms mV mV %  cm m/s m/s mVms  R Median - APB     Wrist APB 4.5 ?4.4 7.0 ?4.0 100 Wrist - APB 7   21.6     Upper arm APB 9.9  6.2  87.9 Upper arm - Wrist 24 44 ?49 19.8  R Ulnar - ADM     Wrist ADM 3.2 ?3.3 6.4 ?6.0 100 Wrist - ADM 7   16.8     B.Elbow ADM 6.8  6.2  97.7 B.Elbow - Wrist 16 44 ?49 18.2     A.Elbow ADM 10.9  5.6  90.3 A.Elbow - B.Elbow 17 41 ?49 19.5  R Peroneal - EDB     Ankle EDB 6.1 ?6.5 2.9 ?2.0 100 Ankle - EDB 9   12.3     Fib head EDB 15.3  2.1  74.2 Fib head - Ankle 26 28 ?44 6.4     Pop fossa EDB 20.0  2.2  105 Pop fossa -  Fib head 18 38 ?44 6.8         Pop fossa - Ankle      R Tibial - AH     Ankle AH 6.4 ?5.8 3.2 ?4.0 100 Ankle - AH 9   9.5     Pop fossa AH 18.3  2.1  65.2 Pop fossa - Ankle 46 39 ?41 7.9             SNC    Nerve / Sites Rec. Site Peak Lat Ref.  Amp Ref. Segments Distance    ms ms V V  cm  R Median - Digit II (Antidromic)     Wrist Dig II 3.8 ?3.5 9 ?20 Wrist - Dig II  13  R Ulnar - Digit V (Antidromic)     Wrist Dig V 3.7 ?3.1 7 ?17 Wrist - Dig V 11  R Radial - Anatomical snuff box (Forearm)     Forearm Wrist 3.2 ?2.9 16 ?15 Forearm - Wrist 10  R Sural - Ankle (Calf)     Calf Ankle NR ?4.4 NR ?6 Calf - Ankle 14  R Superficial peroneal - Ankle     Lat leg Ankle NR ?4.4 NR ?6 Lat leg - Ankle 14  R Median - Orthodromic (Dig II, Mid palm)     Dig II Wrist 3.6 ?3.4 10 ?10 Dig II - Wrist 13  R Ulnar - Orthodromic, (Dig V, Mid palm)     Dig V Wrist 3.4 ?3.1 3 ?5 Dig V - Wrist 74                   F  Wave    Nerve F Lat Ref.   ms ms  R Tibial - AH 73.1 ?56.0  R Peroneal - EDB 45.9 ?56.0  R Ulnar - ADM 36.6 ?32.0           EMG Summary Table    Spontaneous MUAP Recruitment  Muscle IA Fib PSW Fasc Other Amp Dur. Poly Pattern  R. Tibialis anterior Normal None None None _______ Normal Normal Normal Reduced  R. Tibialis posterior Normal None None None _______ Normal Normal Normal Normal  R. Peroneus longus Normal None None None _______ Normal Normal Normal Normal  R. Gastrocnemius (Medial head) Normal None None None _______ Normal Normal Normal Normal  R. Vastus lateralis Normal None None None _______ Normal Normal Normal Normal  R. Abductor hallucis Normal None None None _______ Normal Normal Normal Normal  R. Lumbar paraspinals (low) Normal None None None _______ Normal Normal Normal Normal  R. Lumbar paraspinals (mid) Normal None None None _______ Normal Normal Normal Normal  R. First dorsal interosseous Normal None None None _______ Normal Normal Normal Normal  R. Abductor pollicis brevis Normal None None None _______ Normal Normal Normal Reduced  R. Pronator teres Normal None None None _______ Normal Normal Normal Normal  R. Biceps brachii Normal None None None _______ Normal Normal Normal Reduced  R. Deltoid Normal None None None _______ Normal Normal Normal Reduced  R. Triceps brachii Normal None None None _______ Normal Normal Normal Reduced  R.  Cervical paraspinals Normal None None None _______ Normal Normal Normal Normal

## 2022-02-14 LAB — MULTIPLE MYELOMA PANEL, SERUM
Albumin SerPl Elph-Mcnc: 3.8 g/dL (ref 2.9–4.4)
Albumin/Glob SerPl: 1.6 (ref 0.7–1.7)
Alpha 1: 0.2 g/dL (ref 0.0–0.4)
Alpha2 Glob SerPl Elph-Mcnc: 0.7 g/dL (ref 0.4–1.0)
B-Globulin SerPl Elph-Mcnc: 1 g/dL (ref 0.7–1.3)
Gamma Glob SerPl Elph-Mcnc: 0.5 g/dL (ref 0.4–1.8)
Globulin, Total: 2.5 g/dL (ref 2.2–3.9)
IgA/Immunoglobulin A, Serum: 116 mg/dL (ref 61–437)
IgG (Immunoglobin G), Serum: 572 mg/dL — ABNORMAL LOW (ref 603–1613)
IgM (Immunoglobulin M), Srm: 19 mg/dL (ref 15–143)
Total Protein: 6.3 g/dL (ref 6.0–8.5)

## 2022-02-14 LAB — C-REACTIVE PROTEIN: CRP: 1 mg/L (ref 0–10)

## 2022-02-14 LAB — SEDIMENTATION RATE: Sed Rate: 18 mm/hr (ref 0–30)

## 2022-02-14 LAB — ANA W/REFLEX IF POSITIVE: Anti Nuclear Antibody (ANA): NEGATIVE

## 2022-02-15 ENCOUNTER — Encounter: Payer: Self-pay | Admitting: Family Medicine

## 2022-02-16 ENCOUNTER — Other Ambulatory Visit: Payer: Self-pay | Admitting: *Deleted

## 2022-02-16 MED ORDER — DULOXETINE HCL 60 MG PO CPEP: 60.0000 mg | ORAL_CAPSULE | Freq: Every day | ORAL | 3 refills | Status: DC

## 2022-02-16 NOTE — Progress Notes (Signed)
EMG report is under procedure 

## 2022-02-25 ENCOUNTER — Encounter: Payer: Medicare Other | Admitting: Neurology

## 2022-03-24 DIAGNOSIS — N3010 Interstitial cystitis (chronic) without hematuria: Secondary | ICD-10-CM | POA: Diagnosis not present

## 2022-03-24 DIAGNOSIS — Z125 Encounter for screening for malignant neoplasm of prostate: Secondary | ICD-10-CM | POA: Diagnosis not present

## 2022-03-24 DIAGNOSIS — N401 Enlarged prostate with lower urinary tract symptoms: Secondary | ICD-10-CM | POA: Diagnosis not present

## 2022-03-24 DIAGNOSIS — R351 Nocturia: Secondary | ICD-10-CM | POA: Diagnosis not present

## 2022-04-05 ENCOUNTER — Other Ambulatory Visit: Payer: Self-pay | Admitting: Family Medicine

## 2022-04-13 ENCOUNTER — Other Ambulatory Visit: Payer: Self-pay | Admitting: *Deleted

## 2022-04-13 MED ORDER — ALFUZOSIN HCL ER 10 MG PO TB24: 10.0000 mg | ORAL_TABLET | Freq: Every day | ORAL | 0 refills | Status: DC

## 2022-04-18 ENCOUNTER — Other Ambulatory Visit: Payer: Self-pay | Admitting: Family Medicine

## 2022-04-22 DIAGNOSIS — F3341 Major depressive disorder, recurrent, in partial remission: Secondary | ICD-10-CM | POA: Diagnosis not present

## 2022-04-25 ENCOUNTER — Ambulatory Visit (INDEPENDENT_AMBULATORY_CARE_PROVIDER_SITE_OTHER): Payer: Medicare Other | Admitting: Family Medicine

## 2022-04-25 ENCOUNTER — Encounter: Payer: Self-pay | Admitting: Family Medicine

## 2022-04-25 VITALS — BP 120/70 | HR 80 | Temp 98.6°F | Ht 71.0 in | Wt 195.1 lb

## 2022-04-25 DIAGNOSIS — I10 Essential (primary) hypertension: Secondary | ICD-10-CM

## 2022-04-25 DIAGNOSIS — G609 Hereditary and idiopathic neuropathy, unspecified: Secondary | ICD-10-CM | POA: Diagnosis not present

## 2022-04-25 DIAGNOSIS — E559 Vitamin D deficiency, unspecified: Secondary | ICD-10-CM | POA: Diagnosis not present

## 2022-04-25 DIAGNOSIS — E1165 Type 2 diabetes mellitus with hyperglycemia: Secondary | ICD-10-CM

## 2022-04-25 LAB — CBC WITH DIFFERENTIAL/PLATELET
Basophils Absolute: 0.1 10*3/uL (ref 0.0–0.1)
Basophils Relative: 0.9 % (ref 0.0–3.0)
Eosinophils Absolute: 0.3 10*3/uL (ref 0.0–0.7)
Eosinophils Relative: 5.3 % — ABNORMAL HIGH (ref 0.0–5.0)
HCT: 45.1 % (ref 39.0–52.0)
Hemoglobin: 15.2 g/dL (ref 13.0–17.0)
Lymphocytes Relative: 13.3 % (ref 12.0–46.0)
Lymphs Abs: 0.8 10*3/uL (ref 0.7–4.0)
MCHC: 33.6 g/dL (ref 30.0–36.0)
MCV: 89.4 fl (ref 78.0–100.0)
Monocytes Absolute: 0.4 10*3/uL (ref 0.1–1.0)
Monocytes Relative: 7.1 % (ref 3.0–12.0)
Neutro Abs: 4.6 10*3/uL (ref 1.4–7.7)
Neutrophils Relative %: 73.4 % (ref 43.0–77.0)
Platelets: 202 10*3/uL (ref 150.0–400.0)
RBC: 5.05 Mil/uL (ref 4.22–5.81)
RDW: 13.4 % (ref 11.5–15.5)
WBC: 6.3 10*3/uL (ref 4.0–10.5)

## 2022-04-25 LAB — COMPREHENSIVE METABOLIC PANEL
ALT: 18 U/L (ref 0–53)
AST: 16 U/L (ref 0–37)
Albumin: 3.9 g/dL (ref 3.5–5.2)
Alkaline Phosphatase: 62 U/L (ref 39–117)
BUN: 24 mg/dL — ABNORMAL HIGH (ref 6–23)
CO2: 24 mEq/L (ref 19–32)
Calcium: 9.8 mg/dL (ref 8.4–10.5)
Chloride: 101 mEq/L (ref 96–112)
Creatinine, Ser: 1.08 mg/dL (ref 0.40–1.50)
GFR: 68.41 mL/min (ref >60.00)
Glucose, Bld: 163 mg/dL — ABNORMAL HIGH (ref 70–99)
Potassium: 4.3 mEq/L (ref 3.5–5.1)
Sodium: 136 mEq/L (ref 135–145)
Total Bilirubin: 0.4 mg/dL (ref 0.2–1.2)
Total Protein: 5.9 g/dL — ABNORMAL LOW (ref 6.0–8.3)

## 2022-04-25 LAB — MICROALBUMIN / CREATININE URINE RATIO
Creatinine,U: 86.2 mg/dL
Microalb Creat Ratio: 0.8 mg/g (ref 0.0–30.0)
Microalb, Ur: 0.7 mg/dL (ref 0.0–1.9)

## 2022-04-25 LAB — VITAMIN D 25 HYDROXY (VIT D DEFICIENCY, FRACTURES): VITD: 21.51 ng/mL — ABNORMAL LOW (ref 30.00–100.00)

## 2022-04-25 LAB — HEMOGLOBIN A1C: Hgb A1c MFr Bld: 8.3 % — ABNORMAL HIGH (ref 4.6–6.5)

## 2022-04-25 LAB — MAGNESIUM: Magnesium: 2.2 mg/dL (ref 1.5–2.5)

## 2022-04-25 MED ORDER — LISINOPRIL-HYDROCHLOROTHIAZIDE 20-25 MG PO TABS: 1.0000 | ORAL_TABLET | Freq: Every day | ORAL | 3 refills | Status: DC

## 2022-04-25 MED ORDER — ROSUVASTATIN CALCIUM 20 MG PO TABS: 20.0000 mg | ORAL_TABLET | Freq: Every day | ORAL | 3 refills | Status: DC

## 2022-04-25 MED ORDER — METFORMIN HCL 500 MG PO TABS: 500.0000 mg | ORAL_TABLET | Freq: Three times a day (TID) | ORAL | 1 refills | Status: DC

## 2022-04-25 MED ORDER — EMPAGLIFLOZIN 25 MG PO TABS: 25.0000 mg | ORAL_TABLET | Freq: Every day | ORAL | 3 refills | Status: DC

## 2022-04-25 MED ORDER — GLIPIZIDE 10 MG PO TABS: ORAL_TABLET | ORAL | 3 refills | Status: DC

## 2022-04-25 NOTE — Progress Notes (Signed)
Subjective:     Patient ID: Andrew Tanner, male    DOB: [birth year], 72 y.o.   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 on ACE-I. Changed from prozac to cymbalta-urine issues so changed back to prozac and better.  Off lyrica.  Using supplements.  Off Jardiance-d/t donut hole in Dec.  Sugar this am 116.  Not on atorvastatin- thought d/t pain, but no change.   Annual PSA-2.7-his normal Bone loss-Mg and vit D 500 tid Neuropathy-some from spine and some from DM-some improvement w/tens, infrared,supps.  Achey-intermitt r flank area-on side.  No GI issues.  Not worse to move. Pain R deltoid-has done alt tx.  More when lies in bed on side.     Past Medical History:  Diagnosis Date   Anxiety    Cancer ([place])    basal cell skin CA   Cataract    Depression    Diabetes mellitus without complication ([place])    Hyperlipidemia    Hypertension    IC (interstitial cystitis)    Neuromuscular disorder ([place]) 01/09/21   Peripheral  Neuropathy    Past Surgical History:  Procedure Laterality Date   CATARACT EXTRACTION, BILATERAL     HERNIA REPAIR N/A    inguinal   NECK SURGERY     ant/post   SPINE SURGERY     TRANSURETHRAL RESECTION OF PROSTATE     TURP VAPORIZATION      Outpatient Medications Prior to Visit  Medication Sig Dispense Refill   alfuzosin (UROXATRAL) 10 MG 24 hr tablet Take 1 tablet (10 mg total) by mouth daily with breakfast. 90 tablet 0   clonazePAM (KLONOPIN) 0.5 MG tablet Take 0.5 mg by mouth 3 (three) times daily as needed.     empagliflozin (JARDIANCE) 25 MG TABS tablet Take 1 tablet (25 mg total) by mouth daily before breakfast. 90 tablet 1   FLUoxetine (PROZAC) 20 MG capsule Take 60 mg by mouth every morning.     glucose blood test strip 1 each by Other route daily. Use as instructed checking once per day, or if  new symptoms. 50 each 8   hydrocortisone 2.5 % ointment APPLY TO AFFECTED AREA EVERYDAY AT BEDTIME 29 g 0   hydrOXYzine (ATARAX/VISTARIL) 50 MG tablet Take 50 mg by mouth at bedtime.     ibuprofen (ADVIL,MOTRIN) 800 MG tablet Take 1 tablet (800 mg total) by mouth every 8 (eight) hours as needed for pain. 30 tablet 0   ketoconazole (NIZORAL) 2 % shampoo Apply 1 Application topically 2 (two) times a week. 120 mL 3   atorvastatin (LIPITOR) 20 MG tablet TAKE 1 TABLET BY MOUTH ONCE DAILY AT  6  PM 90 tablet 3   glipiZIDE (GLUCOTROL) 10 MG tablet TAKE 2 TABLETS BY MOUTH TWICE DAILY BEFORE A MEAL 120 tablet 0   lisinopril-hydrochlorothiazide (ZESTORETIC) 20-25 MG tablet Take 1 tablet by mouth daily. 60 tablet 0   metFORMIN (GLUCOPHAGE) 500 MG tablet Take 1 tablet (500 mg total) by mouth 3 (three) times daily. 270 tablet 1   DULoxetine (CYMBALTA) 60 MG capsule Take 1 capsule (60 mg total) by mouth daily. (Patient not taking: Reported on 04/25/2022) 30 capsule 3   pregabalin (LYRICA) 100 MG capsule TAKE 1 CAPSULE BY MOUTH THREE TIMES DAILY 90 capsule 5   No facility-administered medications prior  to visit.    No Known Allergies ROS neg/noncontributory except as noted HPI/below      Objective:     BP 120/70   Pulse 80   Temp 98.6 F (37 C) (Temporal)   Ht '5\' 11"'$  (1.803 m)   Wt 195 lb 2 oz (88.5 kg)   SpO2 99%   BMI 27.21 kg/m  Wt Readings from Last 3 Encounters:  04/25/22 195 lb 2 oz (88.5 kg)  02/10/22 198 lb (89.8 kg)  01/25/22 198 lb (89.8 kg)    Physical Exam   Gen: WDWN NAD HEENT: NCAT, conjunctiva not injected, sclera nonicteric NECK:  supple, no thyromegaly, no nodes, no carotid bruits CARDIAC: RRR, S1S2+, no murmur.  LUNGS: CTAB. No wheezes ABDOMEN:  BS+, soft, NTND, No HSM, no masses EXT:  no edema MSK: no gross abnormalities.  NEURO: A&O x3.  CN II-XII intact.  PSYCH: normal mood. Good eye contact     Assessment & Plan:   Problem List Items Addressed This Visit        Endocrine   Type 2 diabetes mellitus with hyperglycemia, without long-term current use of insulin (HCC) - Primary   Relevant Medications   rosuvastatin (CRESTOR) 20 MG tablet   metFORMIN (GLUCOPHAGE) 500 MG tablet   lisinopril-hydrochlorothiazide (ZESTORETIC) 20-25 MG tablet   glipiZIDE (GLUCOTROL) 10 MG tablet   Other Relevant Orders   Comprehensive metabolic panel   Hemoglobin A1c   Microalbumin / creatinine urine ratio     Nervous and Auditory   Peripheral neuropathy   Other Visit Diagnoses     Vitamin D deficiency       Relevant Orders   VITAMIN D 25 Hydroxy (Vit-D Deficiency, Fractures)   Magnesium   Essential hypertension       Relevant Medications   rosuvastatin (CRESTOR) 20 MG tablet   lisinopril-hydrochlorothiazide (ZESTORETIC) 20-25 MG tablet   Other Relevant Orders   Comprehensive metabolic panel   CBC with Differential/Platelet   Magnesium      DM type 2 w/neuropathy-chronic.  Staple on meds.  Renewed metformin 500 tid, glipizide '10mg'$  bid, Jardiance '25mg'$  daily.  Change atorvastatin to crestor '20mg'$  d/t preference.  Check A1C,cmp, microalb/creat ratio.  Get copy eye exam 2.  Neuropathy-pt doing alternative tx and improving.  SE to cymbalta. 3.  Vitamin d deficience-on 500 tid and mg-check levels.  4.  R deltoid pain at hs-?from neck-check on positions  F/u 3 mo dm, hld,htn  Meds ordered this encounter  Medications   rosuvastatin (CRESTOR) 20 MG tablet    Sig: Take 1 tablet (20 mg total) by mouth daily.    Dispense:  90 tablet    Refill:  3   metFORMIN (GLUCOPHAGE) 500 MG tablet    Sig: Take 1 tablet (500 mg total) by mouth 3 (three) times daily.    Dispense:  270 tablet    Refill:  1   lisinopril-hydrochlorothiazide (ZESTORETIC) 20-25 MG tablet    Sig: Take 1 tablet by mouth daily.    Dispense:  90 tablet    Refill:  3   glipiZIDE (GLUCOTROL) 10 MG tablet    Sig: TAKE 2 TABLETS BY MOUTH TWICE DAILY BEFORE A MEAL    Dispense:  180 tablet     Refill:  3    Wellington Hampshire, MD

## 2022-04-25 NOTE — Patient Instructions (Signed)
It was very nice to see you today!  Changing atorvastatin to crestor(rosuvastatin)   PLEASE NOTE:  If you had any lab tests please let us know if you have not heard back within a few days. You may see your results on MyChart before we have a chance to review them but we will give you a call once they are reviewed by [place]. If we ordered any referrals today, please let us know if you have not heard from their office within the next week.   Please try these tips to maintain a healthy lifestyle:  Eat most of your calories during the day when you are active. Eliminate processed foods including packaged sweets (pies, cakes, cookies), reduce intake of potatoes, white bread, white pasta, and white rice. Look for whole grain options, oat flour or almond flour.  Each meal should contain half fruits/vegetables, one quarter protein, and one quarter carbs (no bigger than a computer mouse).  Cut down on sweet beverages. This includes juice, soda, and sweet tea. Also watch fruit intake, though this is a healthier sweet option, it still contains natural sugar! Limit to 3 servings daily.  Drink at least 1 glass of water with each meal and aim for at least 8 glasses per day  Exercise at least 150 minutes every week.

## 2022-04-27 NOTE — Progress Notes (Signed)
Vitamin d a litle low-take OTC 3000 iu/d A1C a little high-can you take the metformin 4/day?.  Or continue as is and do better on diet

## 2022-04-28 ENCOUNTER — Encounter: Payer: Self-pay | Admitting: *Deleted

## 2022-04-28 DIAGNOSIS — J029 Acute pharyngitis, unspecified: Secondary | ICD-10-CM | POA: Diagnosis not present

## 2022-04-28 DIAGNOSIS — Z20822 Contact with and (suspected) exposure to covid-19: Secondary | ICD-10-CM | POA: Diagnosis not present

## 2022-04-28 DIAGNOSIS — U071 COVID-19: Secondary | ICD-10-CM | POA: Diagnosis not present

## 2022-05-10 DIAGNOSIS — F3341 Major depressive disorder, recurrent, in partial remission: Secondary | ICD-10-CM | POA: Diagnosis not present

## 2022-06-20 ENCOUNTER — Telehealth: Payer: Self-pay

## 2022-06-20 DIAGNOSIS — K862 Cyst of pancreas: Secondary | ICD-10-CM

## 2022-06-20 NOTE — Telephone Encounter (Signed)
-----   Message from Carl Best, RN sent at 09/01/2021 11:51 AM EDT ----- ----- Message -----  From: Aleatha Borer, LPN  Sent: 11/10/599 56:15 AM EDT  To: Aleatha Borer, LPN  Subject: Expected ~ 06/18/22; MRI/pancreatic cyst      Place order for MRI - pancreatic cyst - [facility] - last completed 06/17/21  Send scheduling message for scheduling purposes

## 2022-06-20 NOTE — Telephone Encounter (Signed)
The pt has been advised of the need for repeat MRI- order has been entered and sent to the schedulers

## 2022-06-22 ENCOUNTER — Telehealth: Payer: Self-pay

## 2022-06-22 NOTE — Telephone Encounter (Signed)
-----   Message from Aleatha Borer, LPN sent at 3/41/9622 11:49 AM EDT ----- Regarding: FW: Expected ~ 06/18/22; MRI/pancreatic cyst  ----- Message ----- From: Aleatha Borer, LPN Sent: 05/21/7987  21:19 AM EDT To: Aleatha Borer, LPN Subject: Expected ~ 06/18/22; MRI/pancreatic cyst         Place order for MRI - pancreatic cyst - [facility] - last completed 06/17/21 Send scheduling message for scheduling purposes

## 2022-06-22 NOTE — Telephone Encounter (Signed)
Reminder received in [place]. Chart reviewed.  Pt previously scheduled for MRI on 06/30/2022 at 8:00 AM at [facility]

## 2022-06-27 DIAGNOSIS — F3341 Major depressive disorder, recurrent, in partial remission: Secondary | ICD-10-CM | POA: Diagnosis not present

## 2022-06-30 ENCOUNTER — Ambulatory Visit (HOSPITAL_COMMUNITY)
Admission: RE | Admit: 2022-06-30 | Discharge: 2022-06-30 | Disposition: A | Discharge status: Home / Self Care | Payer: Medicare Other | Source: Ambulatory Visit | Attending: Gastroenterology | Admitting: Gastroenterology

## 2022-06-30 ENCOUNTER — Other Ambulatory Visit: Payer: Self-pay | Admitting: Gastroenterology

## 2022-06-30 DIAGNOSIS — K862 Cyst of pancreas: Secondary | ICD-10-CM

## 2022-06-30 DIAGNOSIS — R935 Abnormal findings on diagnostic imaging of other abdominal regions, including retroperitoneum: Secondary | ICD-10-CM | POA: Diagnosis not present

## 2022-06-30 DIAGNOSIS — N281 Cyst of kidney, acquired: Secondary | ICD-10-CM | POA: Diagnosis not present

## 2022-06-30 MED ORDER — GADOBUTROL 1 MMOL/ML IV SOLN
10.0000 mL | Freq: Once | INTRAVENOUS | Status: AC | PRN
  Administered 2022-06-30: 10 mL via INTRAVENOUS

## 2022-07-06 ENCOUNTER — Telehealth: Payer: Self-pay | Admitting: Adult Health

## 2022-07-06 NOTE — Telephone Encounter (Signed)
LVM and sent mychart msg informing pt of need to reschedule 07/25/22 appointment - NP out

## 2022-07-07 ENCOUNTER — Other Ambulatory Visit: Payer: Self-pay | Admitting: Family Medicine

## 2022-07-20 NOTE — Progress Notes (Unsigned)
No chief complaint on file.   HISTORY OF PRESENT ILLNESS:  07/20/22 ALL:  Courtland Reas is a 72 y.o. male here today for follow up for neuropathy. He was seen in consult with Dr Marjory Lies 12/2021. 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 Also showed chronic right C5-6-7 cervical radiculopathy and moderate right CTS. He was on Lyric, lidocaine patches and ALA prior to visit and advised to continue. Since,    HISTORY (copied from Dr Richrd Humbles previous note)  72 year old male here for evaluation of numbness and feet.  History of cervical myelopathy status post surgery x2.  Also diabetes.   October 2022 patient noticed cold sensation in right foot now affecting left foot.  No problems with hands.  Had MRI of the cervical and thoracic spine which were unremarkable for any new findings.  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  Referred here for further evaluation.   REVIEW OF SYSTEMS: Out of a complete 14 system review of symptoms, the patient complains only of the following symptoms, and all other reviewed systems are negative.   ALLERGIES: No Known Allergies   HOME MEDICATIONS: Outpatient Medications Prior to Visit  Medication Sig Dispense Refill   alfuzosin (UROXATRAL) 10 MG 24 hr tablet Take 1 tablet by mouth once daily with breakfast 90 tablet 0   clonazePAM (KLONOPIN) 0.5 MG tablet Take 0.5 mg by mouth 3 (three) times daily as needed.     empagliflozin (JARDIANCE) 25 MG TABS tablet Take 1 tablet (25 mg total) by mouth daily before breakfast. 90 tablet 3   FLUoxetine (PROZAC) 20 MG capsule Take 60 mg by mouth every morning.     glipiZIDE (GLUCOTROL) 10 MG tablet TAKE 2 TABLETS BY MOUTH TWICE DAILY BEFORE A MEAL 180 tablet 3   glucose blood test strip 1 each by Other route daily. Use as instructed checking once per day, or if new symptoms. 50  each 8   hydrocortisone 2.5 % ointment APPLY TO AFFECTED AREA EVERYDAY AT BEDTIME 29 g 0   hydrOXYzine (ATARAX/VISTARIL) 50 MG tablet Take 50 mg by mouth at bedtime.     ibuprofen (ADVIL,MOTRIN) 800 MG tablet Take 1 tablet (800 mg total) by mouth every 8 (eight) hours as needed for pain. 30 tablet 0   ketoconazole (NIZORAL) 2 % shampoo Apply 1 Application topically 2 (two) times a week. 120 mL 3   lisinopril-hydrochlorothiazide (ZESTORETIC) 20-25 MG tablet Take 1 tablet by mouth daily. 90 tablet 3   metFORMIN (GLUCOPHAGE) 500 MG tablet Take 1 tablet (500 mg total) by mouth 3 (three) times daily. 270 tablet 1   rosuvastatin (CRESTOR) 20 MG tablet Take 1 tablet (20 mg total) by mouth daily. 90 tablet 3   No facility-administered medications prior to visit.     PAST MEDICAL HISTORY: Past Medical History:  Diagnosis Date   Anxiety    Cancer (HCC)    basal cell skin CA   Cataract    Depression    Diabetes mellitus without complication (HCC)    Hyperlipidemia    Hypertension    IC (interstitial cystitis)    Neuromuscular disorder (HCC) 01/09/21   Peripheral  Neuropathy     PAST SURGICAL HISTORY: Past Surgical History:  Procedure Laterality Date   CATARACT EXTRACTION, BILATERAL     HERNIA REPAIR N/A    inguinal   NECK SURGERY     ant/post   SPINE SURGERY  TRANSURETHRAL RESECTION OF PROSTATE     TURP VAPORIZATION       FAMILY HISTORY: Family History  Problem Relation Age of Onset   Lung cancer Mother    Heart disease Father    Hyperlipidemia Father    Diabetes Father        type 1   Diabetes Brother    Depression Brother    Renal cancer Brother    Anxiety disorder Brother    Stomach cancer Neg Hx    Colon cancer Neg Hx    Pancreatic cancer Neg Hx    Esophageal cancer Neg Hx    Rectal cancer Neg Hx      SOCIAL HISTORY: Social History   Socioeconomic History   Marital status: Married    Spouse name: Marilynne Drivers   Number of children: 1   Years of  education: Not on file   Highest education level: Not on file  Occupational History   Not on file  Tobacco Use   Smoking status: Former    Packs/day: 1.00    Years: 5.00    Additional pack years: 0.00    Total pack years: 5.00    Types: Cigarettes    Quit date: 03/02/1982    Years since quitting: 40.4   Smokeless tobacco: Never   Tobacco comments:    quit 28 yrs ago  Vaping Use   Vaping Use: Never used  Substance and Sexual Activity   Alcohol use: Not Currently    Comment: none   Drug use: No   Sexual activity: Not Currently    Birth control/protection: Abstinence  Other Topics Concern   Not on file  Social History Narrative   Retired Risk analyst, 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   Social Determinants of Health   Financial Resource Strain: Low Risk  (12/17/2021)   Overall Financial Resource Strain (CARDIA)    Difficulty of Paying Living Expenses: Not hard at all  Food Insecurity: No Food Insecurity (12/17/2021)   Hunger Vital Sign    Worried About Running Out of Food in the Last Year: Never true    Ran Out of Food in the Last Year: Never true  Transportation Needs: No Transportation Needs (12/17/2021)   PRAPARE - Administrator, Civil Service (Medical): No    Lack of Transportation (Non-Medical): No  Physical Activity: Sufficiently Active (12/17/2021)   Exercise Vital Sign    Days of Exercise per Week: 7 days    Minutes of Exercise per Session: 90 min  Stress: No Stress Concern Present (12/17/2021)   [employer] of Occupational Health - Occupational Stress Questionnaire    Feeling of Stress : Not at all  Social Connections: Socially Integrated (12/17/2021)   Social Connection and Isolation Panel [NHANES]    Frequency of Communication with Friends and Family: More than three times a week    Frequency of Social Gatherings with Friends and Family: More than three times a week    Attends Religious Services: More than 4 times per year    Active Member  of [employer] or Organizations: Yes    Attends Banker Meetings: 1 to 4 times per year    Marital Status: Married  Catering manager Violence: Not At Risk (12/17/2021)   Humiliation, Afraid, Rape, and Kick questionnaire    Fear of Current or Ex-Partner: No    Emotionally Abused: No    Physically Abused: No    Sexually Abused: No     PHYSICAL  EXAM  There were no vitals filed for this visit. There is no height or weight on file to calculate BMI.  Generalized: Well developed, in no acute distress  Cardiology: normal rate and rhythm, no murmur auscultated  Respiratory: clear to auscultation bilaterally    Neurological examination  Mentation: Alert oriented to time, place, history taking. Follows all commands speech and language fluent Cranial nerve II-XII: Pupils were equal round reactive to light. Extraocular movements were full, visual field were full on confrontational test. Facial sensation and strength were normal. Uvula tongue midline. Head turning and shoulder shrug  were normal and symmetric. Motor: The motor testing reveals 5 over 5 strength of all 4 extremities. Good symmetric motor tone is noted throughout.  Sensory: Sensory testing is intact to soft touch on all 4 extremities. No evidence of extinction is noted.  Coordination: Cerebellar testing reveals good finger-nose-finger and heel-to-shin bilaterally.  Gait and station: Gait is normal. Tandem gait is normal. Romberg is negative. No drift is seen.  Reflexes: Deep tendon reflexes are symmetric and normal bilaterally.    DIAGNOSTIC DATA (LABS, IMAGING, TESTING) - I reviewed patient records, labs, notes, testing and imaging myself where available.  Lab Results  Component Value Date   WBC 6.3 04/25/2022   HGB 15.2 04/25/2022   HCT 45.1 04/25/2022   MCV 89.4 04/25/2022   PLT 202.0 04/25/2022      Component Value Date/Time   NA 136 04/25/2022 0834   NA 134 01/20/2020 1053   K 4.3 04/25/2022 0834   CL 101  04/25/2022 0834   CO2 24 04/25/2022 0834   GLUCOSE 163 (H) 04/25/2022 0834   BUN 24 (H) 04/25/2022 0834   BUN 22 01/20/2020 1053   CREATININE 1.08 04/25/2022 0834   CREATININE 1.07 04/06/2021 0913   CALCIUM 9.8 04/25/2022 0834   PROT 5.9 (L) 04/25/2022 0834   PROT 6.3 02/10/2022 1157   ALBUMIN 3.9 04/25/2022 0834   ALBUMIN 4.4 01/20/2020 1053   AST 16 04/25/2022 0834   ALT 18 04/25/2022 0834   ALKPHOS 62 04/25/2022 0834   BILITOT 0.4 04/25/2022 0834   BILITOT 0.3 01/20/2020 1053   GFRNONAA 77 01/20/2020 1053   GFRNONAA >89 03/10/2014 1007   GFRAA 89 01/20/2020 1053   GFRAA >89 03/10/2014 1007   Lab Results  Component Value Date   CHOL 185 10/04/2021   HDL 43.70 10/04/2021   LDLCALC 108 (H) 10/04/2021   TRIG 165.0 (H) 10/04/2021   CHOLHDL 4 10/04/2021   Lab Results  Component Value Date   HGBA1C 8.3 (H) 04/25/2022   Lab Results  Component Value Date   VITAMINB12 >1504 (H) 07/15/2021   Lab Results  Component Value Date   TSH 1.24 07/15/2021        No data to display               No data to display           ASSESSMENT AND PLAN  72 y.o. year old male  has a past medical history of Anxiety, Cancer (HCC), Cataract, Depression, Diabetes mellitus without complication (HCC), Hyperlipidemia, Hypertension, IC (interstitial cystitis), and Neuromuscular disorder (HCC) (01/09/21). here with    No diagnosis found.  Herma [place]av[place] ***.  Healthy lifestyle habits encouraged. *** will follow up with PCP as directed. *** will return to see me in ***, sooner if needed. *** verbalizes understanding and agreement with this plan.   No orders of the defined types were placed in this encounter.  No orders of the defined types were placed in this encounter.    Shawnie Dapper, MSN, FNP-C 07/20/2022, 4:38 PM  [facility] [address] [place], [place] [address] [phone]

## 2022-07-20 NOTE — Patient Instructions (Signed)
Below is our plan:  We will send you for a consult with Dr Carlis Abbott for consideration of Qutenza patches.   Please make sure you are staying well hydrated. I recommend 50-60 ounces daily. Well balanced diet and regular exercise encouraged. Consistent sleep schedule with 6-8 hours recommended.   Please continue follow up with care team as directed.   Follow up with me in 6 months   You may receive a survey regarding today's visit. I encourage you to leave honest feed back as I do use this information to improve patient care. Thank you for seeing me today!

## 2022-07-21 ENCOUNTER — Encounter: Payer: Self-pay | Admitting: Family Medicine

## 2022-07-21 ENCOUNTER — Ambulatory Visit: Payer: Medicare Other | Admitting: Family Medicine

## 2022-07-21 VITALS — BP 114/74 | HR 75 | Ht 71.0 in | Wt 192.5 lb

## 2022-07-21 DIAGNOSIS — E1142 Type 2 diabetes mellitus with diabetic polyneuropathy: Secondary | ICD-10-CM | POA: Diagnosis not present

## 2022-07-21 DIAGNOSIS — G6289 Other specified polyneuropathies: Secondary | ICD-10-CM

## 2022-07-25 ENCOUNTER — Ambulatory Visit (INDEPENDENT_AMBULATORY_CARE_PROVIDER_SITE_OTHER): Payer: Medicare Other | Admitting: Family Medicine

## 2022-07-25 ENCOUNTER — Encounter: Payer: Self-pay | Admitting: Family Medicine

## 2022-07-25 ENCOUNTER — Ambulatory Visit: Payer: Medicare Other | Admitting: Adult Health

## 2022-07-25 VITALS — BP 110/70 | HR 73 | Temp 98.0°F | Ht 71.0 in | Wt 191.0 lb

## 2022-07-25 DIAGNOSIS — E785 Hyperlipidemia, unspecified: Secondary | ICD-10-CM

## 2022-07-25 DIAGNOSIS — I10 Essential (primary) hypertension: Secondary | ICD-10-CM

## 2022-07-25 DIAGNOSIS — G63 Polyneuropathy in diseases classified elsewhere: Secondary | ICD-10-CM | POA: Diagnosis not present

## 2022-07-25 DIAGNOSIS — E1165 Type 2 diabetes mellitus with hyperglycemia: Secondary | ICD-10-CM | POA: Diagnosis not present

## 2022-07-25 DIAGNOSIS — E559 Vitamin D deficiency, unspecified: Secondary | ICD-10-CM | POA: Diagnosis not present

## 2022-07-25 LAB — COMPREHENSIVE METABOLIC PANEL
ALT: 18 U/L (ref 0–53)
AST: 14 U/L (ref 0–37)
Albumin: 4.1 g/dL (ref 3.5–5.2)
Alkaline Phosphatase: 62 U/L (ref 39–117)
BUN: 22 mg/dL (ref 6–23)
CO2: 25 mEq/L (ref 19–32)
Calcium: 9.7 mg/dL (ref 8.4–10.5)
Chloride: 104 mEq/L (ref 96–112)
Creatinine, Ser: 0.96 mg/dL (ref 0.40–1.50)
GFR: 78.66 mL/min (ref >60.00)
Glucose, Bld: 196 mg/dL — ABNORMAL HIGH (ref 70–99)
Potassium: 4.3 mEq/L (ref 3.5–5.1)
Sodium: 139 mEq/L (ref 135–145)
Total Bilirubin: 0.3 mg/dL (ref 0.2–1.2)
Total Protein: 6 g/dL (ref 6.0–8.3)

## 2022-07-25 LAB — LIPID PANEL
Cholesterol: 126 mg/dL (ref 0–200)
HDL: 38.4 mg/dL — ABNORMAL LOW (ref >39.00)
LDL Cholesterol: 66 mg/dL (ref 0–99)
NonHDL: 87.94
Total CHOL/HDL Ratio: 3
Triglycerides: 112 mg/dL (ref 0.0–149.0)
VLDL: 22.4 mg/dL (ref 0.0–40.0)

## 2022-07-25 LAB — VITAMIN D 25 HYDROXY (VIT D DEFICIENCY, FRACTURES): VITD: 26.71 ng/mL — ABNORMAL LOW (ref 30.00–100.00)

## 2022-07-25 LAB — VITAMIN B12: Vitamin B-12: 465 pg/mL (ref 211–911)

## 2022-07-25 LAB — TSH: TSH: 1.17 u[IU]/mL (ref 0.35–5.50)

## 2022-07-25 LAB — HEMOGLOBIN A1C: Hgb A1c MFr Bld: 7.8 % — ABNORMAL HIGH (ref 4.6–6.5)

## 2022-07-25 MED ORDER — ALFUZOSIN HCL ER 10 MG PO TB24: 10.0000 mg | ORAL_TABLET | Freq: Every day | ORAL | 1 refills | Status: DC

## 2022-07-25 NOTE — Assessment & Plan Note (Signed)
Chronic.  Not controlled.  Multifactorial-DM,radiculopathy.  Being managed by Neuro.  Continue alternative treatment(s).  Patient looking into others as well.  Check B12

## 2022-07-25 NOTE — Assessment & Plan Note (Signed)
Chronic.  Not ideally controlled.  Continue crestor 20 mg.  Work on diet/exercise.  Check lipids,cmp

## 2022-07-25 NOTE — Assessment & Plan Note (Signed)
Chronic.  Not ideal.  Taking 1000 IU/D.  Check vitamin D

## 2022-07-25 NOTE — Progress Notes (Signed)
Labs are okay except: 1.  Vitamin D could be a little bit higher-increase vitamin D to 2000 IUs/day 2.  Diabetes is under better control than 3 months ago, however still needs improvement.  Does he want to increase to 4 metformin daily, add daily injection of insulin, do better on diet/exercise, or have me do more research on Ozempic/Mounjaro to see if he can take it with his pancreatic cysts?

## 2022-07-25 NOTE — Progress Notes (Signed)
Subjective:     Patient ID: Andrew Tanner, male    DOB: 11/26/1950, 72 y.o.   MRN: [id number]  Chief Complaint  Patient presents with   Medical Management of Chronic Issues    3 month follow-up for HTN. Dm Fasting Discuss treatment for diabetic neuropathy    HPI 1  DM type 2 w/neuropathy-on Jardiance 25 mg(back on Jardiance), glipizide 10 mg twice daily,metformin 500 three times daily.  On statin, ACE-I.  A1C 8.3 in Jan.  Running 120's-occasional 300.no FH MTC.  History of pancreatitis(on scan)-cysts.Marland Kitchen ophth 5/7. 2.  HYPERTENSION-Pt is on lisinopril/hct 20/25  Bp's running 110/70.  No ha/dizziness/cp/palp/edema/cough/shortness of breath 3.  Perif neuropathy-combo DM and other radiculopathy.  Lyrica not help, gabapentin-dizziess and falls, SE to cymbalta.  Seeing neurology.  Taing ALA,mag,B1,krill,tens,infrared sleeve. Mostly affecting feet right>left. 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  Being referred by neurology to pain clinic.  Getting worse especially cold weather-feet feel very cold.  Worries a lot and distracting.  NervoHFX-implant(interrupts pain signals)-[facility].  Stem cell treatment(s). 4.  HLD-patient on crestor 20 mg and omega 3 5.  Vitamin D deficiency-1000/d    Health Maintenance Due  Topic Date Due   OPHTHALMOLOGY EXAM  08/08/2020    Past Medical History:  Diagnosis Date   Anxiety    Cancer    basal cell skin CA   Cataract    Depression    Diabetes mellitus without complication    Diabetic neuropathy    Hyperlipidemia    Hypertension    IC (interstitial cystitis)    Neuromuscular disorder 01/09/21   Peripheral  Neuropathy    Past Surgical History:  Procedure Laterality Date   CATARACT EXTRACTION, BILATERAL     HERNIA REPAIR N/A    inguinal   NECK SURGERY     ant/post   SPINE SURGERY     TRANSURETHRAL RESECTION OF PROSTATE     TURP VAPORIZATION      Outpatient Medications Prior to Visit   Medication Sig Dispense Refill   ALPHA LIPOIC ACID PO Take 1,000 mg by mouth in the morning.     clonazePAM (KLONOPIN) 0.5 MG tablet Take 0.5 mg by mouth 3 (three) times daily as needed.     empagliflozin (JARDIANCE) 25 MG TABS tablet Take 1 tablet (25 mg total) by mouth daily before breakfast. 90 tablet 3   FLUoxetine (PROZAC) 20 MG capsule Take 60 mg by mouth every morning.     glipiZIDE (GLUCOTROL) 10 MG tablet TAKE 2 TABLETS BY MOUTH TWICE DAILY BEFORE A MEAL 180 tablet 3   glucose blood test strip 1 each by Other route daily. Use as instructed checking once per day, or if new symptoms. 50 each 8   hydrocortisone 2.5 % ointment APPLY TO AFFECTED AREA EVERYDAY AT BEDTIME 29 g 0   hydrOXYzine (ATARAX/VISTARIL) 50 MG tablet Take 50 mg by mouth at bedtime.     ibuprofen (ADVIL,MOTRIN) 800 MG tablet Take 1 tablet (800 mg total) by mouth every 8 (eight) hours as needed for pain. 30 tablet 0   ketoconazole (NIZORAL) 2 % shampoo Apply 1 Application topically 2 (two) times a week. 120 mL 3   Krill Oil/Astaxanthin 1000 MG CAPS Take 1,000 mg by mouth daily.     lisinopril-hydrochlorothiazide (ZESTORETIC) 20-25 MG tablet Take 1 tablet by mouth daily. 90 tablet 3   Magnesium 500 MG TABS Take 500 mg by mouth daily.     metFORMIN (GLUCOPHAGE) 500 MG tablet  Take 1 tablet (500 mg total) by mouth 3 (three) times daily. 270 tablet 1   rosuvastatin (CRESTOR) 20 MG tablet Take 1 tablet (20 mg total) by mouth daily. 90 tablet 3   Thiamine HCl (VITAMIN B-1) 250 MG tablet Take 250 mg by mouth daily.     UNABLE TO FIND Med Name: OUTBACK NEUROPATHY CREAM AND WIPES     alfuzosin (UROXATRAL) 10 MG 24 hr tablet Take 1 tablet by mouth once daily with breakfast 90 tablet 0   No facility-administered medications prior to visit.    No Known Allergies ROS neg/noncontributory except as noted HPI/below Occasional right sided pain-seeing gastroenterology Moods-doing well on medications.  Sees psychiatry.  No SI       Objective:     BP 110/70   Pulse 73   Temp 98 F (36.7 C) (Temporal)   Ht  (1.803 m)   Wt 191 lb (86.6 kg)   SpO2 98%   BMI 26.64 kg/m  Wt Readings from Last 3 Encounters:  07/25/22 191 lb (86.6 kg)  07/21/22 192 lb 8 oz (87.3 kg)  04/25/22 195 lb 2 oz (88.5 kg)    Physical Exam   Gen: WDWN NAD HEENT: NCAT, conjunctiva not injected, sclera nonicteric NECK:  supple, no thyromegaly, no nodes, no carotid bruits CARDIAC: RRR, S1S2+, no murmur. DP 2+B LUNGS: CTAB. No wheezes ABDOMEN:  BS+, soft, NTND, No HSM, no masses EXT:  no edema MSK: no gross abnormalities.  NEURO: A&O x3.  CN II-XII intact.  PSYCH: normal mood. Good eye contact     Assessment & Plan:   Problem List Items Addressed This Visit       Cardiovascular and Mediastinum   Hypertension (Chronic)    Chronic.  Controlled.  Continue lisinopril/hct 20/25      Relevant Orders   Comp Met (CMET)   TSH     Endocrine   Type 2 diabetes mellitus with hyperglycemia, without long-term current use of insulin - Primary    Chronic.  Not well controlled.  Check CMP,A1C.  Continue metformin 500 mg three times daily,glipizide 10 mg twice daily, jardiance 25 mg daily.   Work on diet.  Consider GLP-1(need more research due to pancreatic cysts), or long acting insulin daily.      Relevant Orders   HgB A1c     Nervous and Auditory   Peripheral neuropathy    Chronic.  Not controlled.  Multifactorial-DM,radiculopathy.  Being managed by Neuro.  Continue alternative treatment(s).  Patient looking into others as well.  Check B12      Relevant Orders   Vitamin B12     Other   Hyperlipemia    Chronic.  Not ideally controlled.  Continue crestor 20 mg.  Work on diet/exercise.  Check lipids,cmp      Relevant Orders   Comp Met (CMET)   Lipid panel   TSH   Vitamin D deficiency    Chronic.  Not ideal.  Taking 1000 IU/D.  Check vitamin D      Relevant Orders   VITAMIN D 25 Hydroxy (Vit-D Deficiency, Fractures)   Follow up 3 month(s) DM  Meds ordered this encounter  Medications   alfuzosin (UROXATRAL) 10 MG 24 hr tablet    Sig: Take 1 tablet (10 mg total) by mouth daily with breakfast.    Dispense:  90 tablet    Refill:  1    Angelena Sole, MD

## 2022-07-25 NOTE — Assessment & Plan Note (Signed)
Chronic.  Controlled.  Continue lisinopril/hct 20/25

## 2022-07-25 NOTE — Assessment & Plan Note (Signed)
Chronic.  Not well controlled.  Check CMP,A1C.  Continue metformin 500 mg three times daily,glipizide 10 mg twice daily, jardiance 25 mg daily.   Work on diet.  Consider GLP-1(need more research due to pancreatic cysts), or long acting insulin daily.

## 2022-07-25 NOTE — Patient Instructions (Signed)
It was very nice to see you today!  Consider Ozempic or Mounjaro for diabetes-once/week(s) injection Consider daily long-acting insulin.    PLEASE NOTE:  If you had any lab tests please let us know if you have not heard back within a few days. You may see your results on MyChart before we have a chance to review them but we will give you a call once they are reviewed by [place]. If we ordered any referrals today, please let us know if you have not heard from their office within the next week.   Please try these tips to maintain a healthy lifestyle:  Eat most of your calories during the day when you are active. Eliminate processed foods including packaged sweets (pies, cakes, cookies), reduce intake of potatoes, white bread, white pasta, and white rice. Look for whole grain options, oat flour or almond flour.  Each meal should contain half fruits/vegetables, one quarter protein, and one quarter carbs (no bigger than a computer mouse).  Cut down on sweet beverages. This includes juice, soda, and sweet tea. Also watch fruit intake, though this is a healthier sweet option, it still contains natural sugar! Limit to 3 servings daily.  Drink at least 1 glass of water with each meal and aim for at least 8 glasses per day  Exercise at least 150 minutes every week.

## 2022-07-26 ENCOUNTER — Encounter: Payer: Self-pay | Admitting: Family Medicine

## 2022-07-27 ENCOUNTER — Other Ambulatory Visit: Payer: Self-pay | Admitting: Family Medicine

## 2022-07-27 MED ORDER — METFORMIN HCL 1000 MG PO TABS: 1000.0000 mg | ORAL_TABLET | Freq: Two times a day (BID) | ORAL | 1 refills | Status: DC

## 2022-07-28 ENCOUNTER — Encounter: Payer: Self-pay | Admitting: Physical Medicine and Rehabilitation

## 2022-07-29 NOTE — Telephone Encounter (Signed)
Nothing further needed 

## 2022-08-16 DIAGNOSIS — H524 Presbyopia: Secondary | ICD-10-CM | POA: Diagnosis not present

## 2022-08-16 LAB — HM DIABETES EYE EXAM

## 2022-08-18 ENCOUNTER — Ambulatory Visit: Payer: Medicare Other | Admitting: Nurse Practitioner

## 2022-08-18 ENCOUNTER — Encounter: Payer: Self-pay | Admitting: Nurse Practitioner

## 2022-08-18 VITALS — BP 110/70 | HR 85 | Ht 71.0 in | Wt 189.0 lb

## 2022-08-18 DIAGNOSIS — Z86010 Personal history of colonic polyps: Secondary | ICD-10-CM

## 2022-08-18 DIAGNOSIS — K862 Cyst of pancreas: Secondary | ICD-10-CM

## 2022-08-18 DIAGNOSIS — R109 Unspecified abdominal pain: Secondary | ICD-10-CM

## 2022-08-18 MED ORDER — DICYCLOMINE HCL 10 MG PO CAPS: 10.0000 mg | ORAL_CAPSULE | Freq: Two times a day (BID) | ORAL | 1 refills | Status: DC

## 2022-08-18 NOTE — Progress Notes (Signed)
08/18/2022 Vrishank Nepomuceno [id number] 1950-05-28   Chief Complaint: Follow up pancreatic cysts and chronic right sided pain  History of Present Illness: Andrew Tanner is a 72 year old male with a past medical history of depression, hypertension, hyperlipidemia, pancreatic cysts, DM II and interstitial cystitis. Past right inguinal hernia surgery.  He presents today for follow-up regarding pancreatic cysts and chronic right-sided abdominal/flank pain. His most recent surveillance abdominal MRI/MRCP 06/30/2022 showed numerous cystic lesions throughout the pancreas with the largest measuring 1.5 x 1.0 cm in the inferior pancreatic body, several of these lesions are thinly septated without evidence of pancreatic ductal dilatation or surrounding inflammatory changes. 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 Dr. Orvan Falconer planned on getting a pancreatic CT or MRI in 2 years.  His appetite is good.  Weight is stable.  No nausea, vomiting or upper abdominal pain.  He continues to have intermittent right sided abdominal pain which lasts for 30 to 60 minutes 2-3 times weekly for the past 2 to 3 years. Change of position sometimes triggers this discomfort and other times does not.  No improvement after passing a bowel movement.  He typically passes a normal dark brown bowel movement daily.  He questioned possibly seeing a black stool every now and then.  No rectal bleeding.  Labs 04/25/2022 showed a hemoglobin level of 15.2.     Latest Ref Rng & Units 04/25/2022    8:34 AM 02/19/2020   10:34 AM 01/09/2012    9:27 AM  CBC  WBC 4.0 - 10.5 K/uL 6.3  7.9  7.6   Hemoglobin 13.0 - 17.0 g/dL 40.9  81.1  91.4   Hematocrit 39.0 - 52.0 % 45.1  49.2  49.7   Platelets 150.0 - 400.0 K/uL 202.0  213          Latest Ref Rng & Units 07/25/2022    8:56 AM 04/25/2022    8:34 AM 02/10/2022   11:57 AM  CMP  Glucose 70 - 99 mg/dL 782  956    BUN 6 - 23 mg/dL  22  24    Creatinine 2.13 - 1.50 mg/dL 0.86  5.78    Sodium 469 - 145 mEq/L 139  136    Potassium 3.5 - 5.1 mEq/L 4.3  4.3    Chloride 96 - 112 mEq/L 104  101    CO2 19 - 32 mEq/L 25  24    Calcium 8.4 - 10.5 mg/dL 9.7  9.8    Total Protein 6.0 - 8.3 g/dL 6.0  5.9  6.3   Total Bilirubin 0.2 - 1.2 mg/dL 0.3  0.4    Alkaline Phos 39 - 117 U/L 62  62    AST 0 - 37 U/L 14  16    ALT 0 - 53 U/L 18  18       Abdominal MRI/MRCP w/wo contrast 06/30/2022:  FINDINGS: Lower chest: No acute abnormality.   Hepatobiliary: No solid liver abnormality is seen. No gallstones, gallbladder wall thickening, or biliary dilatation.   Pancreas: Numerous fluid signal cystic lesions are again seen scattered throughout the pancreas, largest again in the inferior pancreatic body measuring 1.5 x 1.0 cm (series 5, image 14). Several of these lesions are thinly septated however there is no apparent solid component or contrast enhancement. No pancreatic ductal dilatation or surrounding inflammatory changes.   Spleen: Normal in size without significant abnormality.   Adrenals/Urinary Tract: Adrenal glands are unremarkable. Simple,  benign right renal cortical and parapelvic cysts, for which no further follow-up or characterization is required. Kidneys are otherwise normal, without obvious renal calculi, solid lesion, or hydronephrosis.   Stomach/Bowel: Stomach is within normal limits. No evidence of bowel wall thickening, distention, or inflammatory changes.   Vascular/Lymphatic: No significant vascular findings are present. No enlarged abdominal lymph nodes.   Other: No abdominal wall hernia or abnormality. No ascites.   Musculoskeletal: No acute or significant osseous findings.   IMPRESSION: Numerous fluid signal cystic lesions are again seen scattered throughout the pancreas, largest again in the inferior pancreatic body measuring 1.5 x 1.0 cm. Several of these lesions are thinly septated however  there is no apparent solid component or contrast enhancement. No pancreatic ductal dilatation or surrounding inflammatory changes. 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 These are almost certainly benign given size, imaging characteristics, and well-established stability; consider one further follow-up examination in 2 years to ensure continued stability, however no follow-up is required past the age of 83.  CTAP with contrast 06/17/2021: Numerous fluid attenuation cystic lesions are again seen throughout the pancreatic parenchyma, largest in the pancreatic body measuring 1.5 cm. Minimal prominence of the distal pancreatic duct measuring up to 0.3 cm. 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 Recommend follow-up MR in 2 years to ensure ongoing stability.  Aortic Atherosclerosis   Abdominal MRI with MRCP w/wo contrast 06/03/2020:  1. There are innumerable fluid signal cystic lesions throughout the pancreas, many of which appear to clearly communicate with the main pancreatic duct, and the largest in the pancreatic body measuring 1.7 cm. Findings are most consistent with side branch IPMNs, although multiplicity does suggest chronic sequelae of prior pancreatitis. No pancreatic ductal dilatation or surrounding inflammatory changes. Recommend follow up pre and post contrast MRI/MRCP or pancreatic protocol CT in 1 year. This recommendation follows ACR consensus guidelines: Management of Incidental Pancreatic Cysts: A White Paper of the ACR Incidental Findings Committee. J Am Coll Radiol 2017;14:911-923. 2. The right lower quadrant is generally not imaged on MR of the abdomen and there are no findings on this examination to explain patient's reported right lower quadrant abdominal pain.  CTAP with contrast 04/13/2020: 1. No acute findings in the abdomen or  pelvis. 2. Normal CT appearance of the appendix. 3. Multiple small incidental pancreatic cysts predominantly involving the body and tail. The largest along the undersurface of the mid body measures approximately 10 x 12 x 11 mm. Another smaller cyst lies along the superior aspect of the body and there are at least 2 or 3 smaller cysts involving the tail of the pancreas. There is some associated mild prominence of the pancreatic duct especially at the level of the body and tail with maximum diameter of 4 mm. These are most likely small postinflammatory or benign cysts. Indolent neoplasm, such as intraductal papillary mucinous tumors could look similar. Recommend follow up pre and post contrast MRI/MRCP in 1 year. This recommendation follows ACR consensus guidelines: Management of Incidental Pancreatic Cysts: A White Paper of the ACR Incidental Findings Committee. J Am Coll Radiol 2017;14:911-923. 4. Moderate prostatic enlargement. 5. Aortic atherosclerosis without evidence of aneurysm.    GI PROCEDURES:  Colonoscopy 03/30/2020: - The perianal and digital rectal examinations were normal. - A 2 mm polyp was found in the descending colon. The polyp was flat. The polyp was removed with a cold snare. Resection and retrieval were  complete. Estimated blood loss was minimal. - A 4 mm polyp was found in the distal transverse colon. The polyp was semi-pedunculated. The polyp was removed with a cold snare. Resection and retrieval were complete. Estimated blood loss was minimal. - A 5 mm polyp was found in the ascending colon. The polyp was semi-pedunculated. The polyp was removed with a cold snare. Resection and retrieval were complete. Estimated blood loss was minimal. - Multiple small and large-mouthed diverticula were found in the sigmoid colon, descending colon and ascending colon. - Non-bleeding internal hemorrhoids were found. - Approximately 10 cm of terminal ileum was examined and  found to be normal. The exam was otherwise without abnormality on direct and retroflexion views. -Recall colonoscopy 3 years. Biopsy Report: Surgical [P], colon, transverse, ascending, descending, polyps (3) - TUBULAR ADENOMA (SIX FRAGMENTS). - NO HIGH GRADE DYSPLASIA OR CARCINOMA.  Past Medical History:  Diagnosis Date   Anxiety    Cancer (HCC)    basal cell skin CA   Cataract    Depression    Diabetes mellitus without complication (HCC)    Diabetic neuropathy (HCC)    Hyperlipidemia    Hypertension    IC (interstitial cystitis)    Neuromuscular disorder (HCC) 01/09/21   Peripheral  Neuropathy   Past Surgical History:  Procedure Laterality Date   CATARACT EXTRACTION, BILATERAL     HERNIA REPAIR N/A    inguinal   NECK SURGERY     ant/post   SPINE SURGERY     TRANSURETHRAL RESECTION OF PROSTATE     TURP VAPORIZATION       Current Outpatient Medications on File Prior to Visit  Medication Sig Dispense Refill   alfuzosin (UROXATRAL) 10 MG 24 hr tablet Take 1 tablet (10 mg total) by mouth daily with breakfast. 90 tablet 1   ALPHA LIPOIC ACID PO Take 1,000 mg by mouth in the morning.     clonazePAM (KLONOPIN) 0.5 MG tablet Take 0.5 mg by mouth 3 (three) times daily as needed.     empagliflozin (JARDIANCE) 25 MG TABS tablet Take 1 tablet (25 mg total) by mouth daily before breakfast. 90 tablet 3   FLUoxetine (PROZAC) 20 MG capsule Take 60 mg by mouth every morning.     glipiZIDE (GLUCOTROL) 10 MG tablet TAKE 2 TABLETS BY MOUTH TWICE DAILY BEFORE A MEAL 180 tablet 3   glucose blood test strip 1 each by Other route daily. Use as instructed checking once per day, or if new symptoms. 50 each 8   hydrocortisone 2.5 % ointment APPLY TO AFFECTED AREA EVERYDAY AT BEDTIME 29 g 0   hydrOXYzine (ATARAX/VISTARIL) 50 MG tablet Take 50 mg by mouth at bedtime.     ibuprofen (ADVIL,MOTRIN) 800 MG tablet Take 1 tablet (800 mg total) by mouth every 8 (eight) hours as needed for pain. 30 tablet 0    ketoconazole (NIZORAL) 2 % shampoo Apply 1 Application topically 2 (two) times a week. 120 mL 3   Krill Oil/Astaxanthin 1000 MG CAPS Take 1,000 mg by mouth daily.     lisinopril-hydrochlorothiazide (ZESTORETIC) 20-25 MG tablet Take 1 tablet by mouth daily. 90 tablet 3   Magnesium 500 MG TABS Take 500 mg by mouth daily.     metFORMIN (GLUCOPHAGE) 1000 MG tablet Take 1 tablet (1,000 mg total) by mouth 2 (two) times daily with a meal. 180 tablet 1   rosuvastatin (CRESTOR) 20 MG tablet Take 1 tablet (20 mg total) by mouth daily. 90 tablet 3  Thiamine HCl (VITAMIN B-1) 250 MG tablet Take 250 mg by mouth daily.     UNABLE TO FIND Med Name: OUTBACK NEUROPATHY CREAM AND WIPES     No current facility-administered medications on file prior to visit.   No Known Allergies  Current Medications, Allergies, Past Medical History, Past Surgical History, Family History and Social History were reviewed in [employer] record.  Review of Systems:   Constitutional: Negative for fever, sweats, chills or weight loss.  Respiratory: Negative for shortness of breath.   Cardiovascular: Negative for chest pain, palpitations and leg swelling.  Gastrointestinal: See HPI.  Musculoskeletal: Negative for back pain or muscle aches.  Neurological: Negative for dizziness, headaches or paresthesias.   Physical Exam: BP 110/70   Pulse 85   Ht 5\' 11"  (1.803 m)   Wt 189 lb (85.7 kg)   BMI 26.36 kg/m   Wt Readings from Last 3 Encounters:  08/18/22 189 lb (85.7 kg)  07/25/22 191 lb (86.6 kg)  07/21/22 192 lb 8 oz (87.3 kg)    General: 72 year old male in no acute distress. Head: Normocephalic and atraumatic. Eyes: No scleral icterus. Conjunctiva pink . Ears: Normal auditory acuity. Mouth: Dentition intact. No ulcers or lesions.  Lungs: Clear throughout to auscultation. Heart: Regular rate and rhythm, no murmur. Abdomen: Soft, nontender and nondistended. No masses or hepatomegaly. Normal  bowel sounds x 4 quadrants.  Rectal: Deferred.  Musculoskeletal: Symmetrical with no gross deformities. Extremities: No edema. Neurological: Alert oriented x 4. No focal deficits.  Psychological: Alert and cooperative. Normal mood and affect  Assessment and Recommendations:  Multiple pancreatic cysts, incidental finding per CTAP 04/2020. His most recent surveillance abdominal MRI/MRCP 06/30/2022 showed numerous cystic lesions throughout the pancreas with the largest measuring 1.5 x 1.0 cm in the inferior pancreatic body, several of these lesions are thinly septated without evidence of pancreatic ductal dilatation or surrounding inflammatory changes. 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  -Repeat pancreatic MRI vs CT 06/2024  History of colon polyps. Three tubular adenomatous polyps removed from the colon per initial screening colonoscopy 03/2020.  No known family history of colon cancer. -Next colonoscopy due 12/202024  Chronic right flank/RLQ pain, suspect secondary to adhesions from remote right inguinal hernia surgery. CTAP 04/2020 without acute intra-abdominal/pelvic pathology to explain his symptoms. -Dicyclomine 10 mg 1 p.o. twice daily, patient cautioned to discontinue if he develops dizziness or lightheadedness -IBgard 1 p.o. twice daily as needed abdominal pain -Consider trial with low-dose amitriptyline if no improvement -Patient to contact her office if his right flank/lower abdominal pain worsens  DM type II

## 2022-08-18 NOTE — Patient Instructions (Addendum)
IbGuard- 1 by mouth twice daily  CT scan due March 2026.  Colonoscopy due December 2024  We have sent the following medications to your pharmacy for you to pick up at your convenience: Dicyclomine 10 mg  Due to recent changes in healthcare laws, you may see the results of your imaging and laboratory studies on MyChart before your provider has had a chance to review them.  We understand that in some cases there may be results that are confusing or concerning to you. Not all laboratory results come back in the same time frame and the provider may be waiting for multiple results in order to interpret others.  Please give [place] 48 hours in order for your provider to thoroughly review all the results before contacting the office for clarification of your results.   Thank you for trusting me with your gastrointestinal care!   Alcide Evener, CRNP

## 2022-08-19 ENCOUNTER — Telehealth: Payer: Self-pay

## 2022-08-19 NOTE — Telephone Encounter (Signed)
PA request received via CMM for Dicyclomine HCl 10MG  capsules  PA submitted to [facility] Medicare and is pending additional questions/determination  Key: [id number]

## 2022-08-22 ENCOUNTER — Telehealth: Payer: Self-pay

## 2022-08-22 NOTE — Progress Notes (Signed)
Andrew Tanner, pls enter pancreatic MRI recall 06/2024 if not already done.

## 2022-08-22 NOTE — Progress Notes (Signed)
Agree with the assessment and plan as outlined by Colleen Kennedy-Smith, NP.    Judine Arciniega E. Brenn Deziel, MD [facility]  

## 2022-08-22 NOTE — Telephone Encounter (Signed)
Message Received: Today Arnaldo Natal, NP  Emeline Darling, RN      Previous Messages  Routed Note  Author: Arnaldo Natal, NP Service: Gastroenterology Author Type: Nurse Practitioner  Filed: 08/22/2022  8:43 AM Encounter Date: 08/18/2022 Status: Signed  Editor: Arnaldo Natal, NP (Nurse Practitioner)   Viviann Spare, pls enter pancreatic MRI recall 06/2024 if not already done.

## 2022-08-22 NOTE — Telephone Encounter (Signed)
Reminder placed in Epic 

## 2022-08-23 ENCOUNTER — Telehealth: Payer: Self-pay | Admitting: Nurse Practitioner

## 2022-08-23 DIAGNOSIS — H35373 Puckering of macula, bilateral: Secondary | ICD-10-CM | POA: Diagnosis not present

## 2022-08-23 DIAGNOSIS — E119 Type 2 diabetes mellitus without complications: Secondary | ICD-10-CM | POA: Diagnosis not present

## 2022-08-23 DIAGNOSIS — H43813 Vitreous degeneration, bilateral: Secondary | ICD-10-CM | POA: Diagnosis not present

## 2022-08-23 NOTE — Telephone Encounter (Signed)
Please see notes below and advise 

## 2022-08-23 NOTE — Telephone Encounter (Signed)
Megan from [facility] called in to informed that the medication Dicyclomine was denied , because it is not being prescribed for a medical expense used.

## 2022-08-23 NOTE — Telephone Encounter (Signed)
Reason for denial sent in different phone note.

## 2022-08-23 NOTE — Telephone Encounter (Signed)
Andrew Tanner, pls clarify reason for denial. Dicyclomine was prescribed for abd pain.

## 2022-08-23 NOTE — Telephone Encounter (Signed)
PA DENIED due to:  Denied. We denied this request under Medicare Part D because Dicyclomine is not being prescribed for an FDA labeled or medically accepted use. A medically accepted use is approved by the FDA or supported by the [facility] Formulary Service Drug Information and the DRUGDEX Information System. In this case, Dicyclomine is not being prescribed in accordance with an FDA labeled use or use accepted by the Medicare approved drug compendia.    *diagnosis used R10.9 - Unspecified abdominal pain

## 2022-08-23 NOTE — Telephone Encounter (Signed)
Please see notes below in regard to Denial for the Dicyclomine

## 2022-08-24 NOTE — Telephone Encounter (Signed)
Andrew Tanner, change the diagnosis to abdominal cramping and see if Dicyclomine is still denied. Not sure why they would deny it for abdominal pain? If Dicyclomine is not approved, please send in RX for Hyoscyamine 0.125mg  one tab SL Q 8 hrs PRN abdominal pain/cramping # 30, no refills. THX.

## 2022-08-24 NOTE — Telephone Encounter (Signed)
See other response

## 2022-08-29 DIAGNOSIS — F3341 Major depressive disorder, recurrent, in partial remission: Secondary | ICD-10-CM | POA: Diagnosis not present

## 2022-08-30 ENCOUNTER — Encounter: Payer: Medicare Other | Admitting: Physical Medicine and Rehabilitation

## 2022-08-30 DIAGNOSIS — E1140 Type 2 diabetes mellitus with diabetic neuropathy, unspecified: Secondary | ICD-10-CM | POA: Diagnosis not present

## 2022-08-30 NOTE — Telephone Encounter (Signed)
An appeal would need to be done by providers office for a change in DX code. Levsin/Hyoscyamine is also typically not covered.

## 2022-08-30 NOTE — Telephone Encounter (Signed)
Can you explain in detail what we need to do to do an appeal please.

## 2022-09-01 DIAGNOSIS — F32A Depression, unspecified: Secondary | ICD-10-CM | POA: Diagnosis not present

## 2022-09-01 DIAGNOSIS — Z79899 Other long term (current) drug therapy: Secondary | ICD-10-CM | POA: Diagnosis not present

## 2022-09-01 DIAGNOSIS — F419 Anxiety disorder, unspecified: Secondary | ICD-10-CM | POA: Diagnosis not present

## 2022-09-06 NOTE — Telephone Encounter (Signed)
Provider/office can contact insurance provider to request an appeal.

## 2022-09-07 ENCOUNTER — Other Ambulatory Visit (HOSPITAL_COMMUNITY): Payer: Self-pay

## 2022-09-07 NOTE — Telephone Encounter (Signed)
Please see notes below and advise 

## 2022-09-07 NOTE — Telephone Encounter (Signed)
Andrew Tanner, please have patient try Ibgard otc one po bid for his abdominal pain since Medicare will not cover Dicyclomine or Hyoscyamine. I will review the appeal process when I am in the clinic next week.  THX

## 2022-09-07 NOTE — Telephone Encounter (Signed)
Denial letter has been scanned in to media. Instructions is on the letter on how to do the appeal. However, as previously stated Dicyclomine and Hyoscyamine are not covered under Medicare Part D.

## 2022-09-08 NOTE — Telephone Encounter (Signed)
Spoke with patient & he stated he has been taking dicyclomine for two weeks now. He had tried Ibgard OTC and did not find any relief, and feels that it is too soon to tell if this medication is helping or not. Will make NP aware since an appeal has been mentioned previously.

## 2022-09-16 DIAGNOSIS — M47814 Spondylosis without myelopathy or radiculopathy, thoracic region: Secondary | ICD-10-CM | POA: Diagnosis not present

## 2022-09-16 DIAGNOSIS — M5124 Other intervertebral disc displacement, thoracic region: Secondary | ICD-10-CM | POA: Diagnosis not present

## 2022-09-16 DIAGNOSIS — E1140 Type 2 diabetes mellitus with diabetic neuropathy, unspecified: Secondary | ICD-10-CM | POA: Diagnosis not present

## 2022-09-19 DIAGNOSIS — F3341 Major depressive disorder, recurrent, in partial remission: Secondary | ICD-10-CM | POA: Diagnosis not present

## 2022-09-26 DIAGNOSIS — E1140 Type 2 diabetes mellitus with diabetic neuropathy, unspecified: Secondary | ICD-10-CM | POA: Diagnosis not present

## 2022-10-05 DIAGNOSIS — F3341 Major depressive disorder, recurrent, in partial remission: Secondary | ICD-10-CM | POA: Diagnosis not present

## 2022-11-09 ENCOUNTER — Other Ambulatory Visit (HOSPITAL_BASED_OUTPATIENT_CLINIC_OR_DEPARTMENT_OTHER): Payer: Self-pay

## 2022-11-09 ENCOUNTER — Emergency Department (HOSPITAL_BASED_OUTPATIENT_CLINIC_OR_DEPARTMENT_OTHER): Payer: Medicare Other

## 2022-11-09 ENCOUNTER — Emergency Department (HOSPITAL_BASED_OUTPATIENT_CLINIC_OR_DEPARTMENT_OTHER): Payer: Medicare Other | Admitting: Radiology

## 2022-11-09 ENCOUNTER — Other Ambulatory Visit: Payer: Self-pay

## 2022-11-09 ENCOUNTER — Emergency Department (HOSPITAL_BASED_OUTPATIENT_CLINIC_OR_DEPARTMENT_OTHER)
Admission: EM | Admit: 2022-11-09 | Discharge: 2022-11-09 | Disposition: A | Discharge status: Home / Self Care | Payer: Medicare Other | Attending: Emergency Medicine | Admitting: Emergency Medicine

## 2022-11-09 ENCOUNTER — Encounter (HOSPITAL_BASED_OUTPATIENT_CLINIC_OR_DEPARTMENT_OTHER): Payer: Self-pay | Admitting: Emergency Medicine

## 2022-11-09 DIAGNOSIS — I10 Essential (primary) hypertension: Secondary | ICD-10-CM | POA: Diagnosis not present

## 2022-11-09 DIAGNOSIS — W01198A Fall on same level from slipping, tripping and stumbling with subsequent striking against other object, initial encounter: Secondary | ICD-10-CM | POA: Diagnosis not present

## 2022-11-09 DIAGNOSIS — Y92002 Bathroom of unspecified non-institutional (private) residence single-family (private) house as the place of occurrence of the external cause: Secondary | ICD-10-CM | POA: Diagnosis not present

## 2022-11-09 DIAGNOSIS — S0990XA Unspecified injury of head, initial encounter: Secondary | ICD-10-CM | POA: Diagnosis not present

## 2022-11-09 DIAGNOSIS — Z79899 Other long term (current) drug therapy: Secondary | ICD-10-CM | POA: Diagnosis not present

## 2022-11-09 DIAGNOSIS — K5730 Diverticulosis of large intestine without perforation or abscess without bleeding: Secondary | ICD-10-CM | POA: Diagnosis not present

## 2022-11-09 DIAGNOSIS — R42 Dizziness and giddiness: Secondary | ICD-10-CM | POA: Insufficient documentation

## 2022-11-09 DIAGNOSIS — Z7901 Long term (current) use of anticoagulants: Secondary | ICD-10-CM | POA: Diagnosis not present

## 2022-11-09 DIAGNOSIS — R310 Gross hematuria: Secondary | ICD-10-CM | POA: Insufficient documentation

## 2022-11-09 DIAGNOSIS — R55 Syncope and collapse: Secondary | ICD-10-CM | POA: Insufficient documentation

## 2022-11-09 DIAGNOSIS — R319 Hematuria, unspecified: Secondary | ICD-10-CM | POA: Diagnosis not present

## 2022-11-09 DIAGNOSIS — N281 Cyst of kidney, acquired: Secondary | ICD-10-CM | POA: Diagnosis not present

## 2022-11-09 DIAGNOSIS — Z7984 Long term (current) use of oral hypoglycemic drugs: Secondary | ICD-10-CM | POA: Diagnosis not present

## 2022-11-09 DIAGNOSIS — E1165 Type 2 diabetes mellitus with hyperglycemia: Secondary | ICD-10-CM | POA: Insufficient documentation

## 2022-11-09 DIAGNOSIS — K8689 Other specified diseases of pancreas: Secondary | ICD-10-CM | POA: Diagnosis not present

## 2022-11-09 DIAGNOSIS — E236 Other disorders of pituitary gland: Secondary | ICD-10-CM | POA: Diagnosis not present

## 2022-11-09 LAB — CBC WITH DIFFERENTIAL/PLATELET
Abs Immature Granulocytes: 0.03 10*3/uL (ref 0.00–0.07)
Basophils Absolute: 0.1 10*3/uL (ref 0.0–0.1)
Basophils Relative: 1 %
Eosinophils Absolute: 0.2 10*3/uL (ref 0.0–0.5)
Eosinophils Relative: 2 %
HCT: 41.1 % (ref 39.0–52.0)
Hemoglobin: 14.2 g/dL (ref 13.0–17.0)
Immature Granulocytes: 0 %
Lymphocytes Relative: 7 %
Lymphs Abs: 0.7 10*3/uL (ref 0.7–4.0)
MCH: 31 pg (ref 26.0–34.0)
MCHC: 34.5 g/dL (ref 30.0–36.0)
MCV: 89.7 fL (ref 80.0–100.0)
Monocytes Absolute: 0.6 10*3/uL (ref 0.1–1.0)
Monocytes Relative: 6 %
Neutro Abs: 8 10*3/uL — ABNORMAL HIGH (ref 1.7–7.7)
Neutrophils Relative %: 84 %
Platelets: 191 10*3/uL (ref 150–400)
RBC: 4.58 MIL/uL (ref 4.22–5.81)
RDW: 13.4 % (ref 11.5–15.5)
WBC: 9.5 10*3/uL (ref 4.0–10.5)
nRBC: 0 % (ref 0.0–0.2)

## 2022-11-09 LAB — COMPREHENSIVE METABOLIC PANEL
ALT: 16 U/L (ref 0–44)
AST: 15 U/L (ref 15–41)
Albumin: 3.8 g/dL (ref 3.5–5.0)
Alkaline Phosphatase: 51 U/L (ref 38–126)
Anion gap: 10 (ref 5–15)
BUN: 27 mg/dL — ABNORMAL HIGH (ref 8–23)
CO2: 21 mmol/L — ABNORMAL LOW (ref 22–32)
Calcium: 9.4 mg/dL (ref 8.9–10.3)
Chloride: 103 mmol/L (ref 98–111)
Creatinine, Ser: 1.02 mg/dL (ref 0.61–1.24)
GFR, Estimated: >60 mL/min (ref >60)
Glucose, Bld: 207 mg/dL — ABNORMAL HIGH (ref 70–99)
Potassium: 4.3 mmol/L (ref 3.5–5.1)
Sodium: 134 mmol/L — ABNORMAL LOW (ref 135–145)
Total Bilirubin: 0.4 mg/dL (ref 0.3–1.2)
Total Protein: 6 g/dL — ABNORMAL LOW (ref 6.5–8.1)

## 2022-11-09 LAB — URINALYSIS, ROUTINE W REFLEX MICROSCOPIC
Bacteria, UA: NONE SEEN
Bilirubin Urine: NEGATIVE
Glucose, UA: >1000 mg/dL — AB
Ketones, ur: 40 mg/dL — AB
Leukocytes,Ua: NEGATIVE
Nitrite: NEGATIVE
Protein, ur: NEGATIVE mg/dL
Specific Gravity, Urine: 1.036 — ABNORMAL HIGH (ref 1.005–1.030)
pH: 5 (ref 5.0–8.0)

## 2022-11-09 MED ORDER — IOHEXOL 300 MG/ML  SOLN
100.0000 mL | Freq: Once | INTRAMUSCULAR | Status: AC | PRN
  Administered 2022-11-09: 100 mL via INTRAVENOUS

## 2022-11-09 MED ORDER — LACTATED RINGERS IV BOLUS
500.0000 mL | Freq: Once | INTRAVENOUS | Status: AC
  Administered 2022-11-09: 500 mL via INTRAVENOUS

## 2022-11-09 NOTE — Discharge Instructions (Signed)
Your workup today was reassuring.  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  He also have a stone in your pancreas, you should call your primary care doctor and gastroenterologist to follow-up for this.  You should make sure you are drinking plenty of fluids.  If you develop blood in your urine, difficulty urinating, pain with urination, dizziness or fainting you should return to the ED.

## 2022-11-09 NOTE — ED Triage Notes (Signed)
Pt arrives to ED with c/o fall, dizziness, head injury, and syncope. Pt notes he got up from bed this morning and got lightheaded/dizzy and fell in his bathroom. He notes hitting his head on the blinds and floor. Also pt notes new onset hematuria after fall.

## 2022-11-09 NOTE — ED Notes (Signed)
Pt aware of the need for a urine... Unable to provide a sample/.Marland KitchenMarland Kitchen

## 2022-11-09 NOTE — ED Notes (Signed)
Bladder scan -- Average after 3 tries was left in the bladder... Pt had just used the restroom... Provider notified.Marland KitchenMarland Kitchen

## 2022-11-09 NOTE — ED Provider Notes (Signed)
[place] EMERGENCY DEPARTMENT AT [facility] Provider Note   CSN: [id number] Arrival date & time: 11/09/22  0818     History  Chief Complaint  Patient presents with   Fall   Head Injury    Reco Iseminger is a 72 y.o. male.   Fall  Head Injury 72 year old male history of depression, diabetes, hypertension, hyperlipidemia presenting for fall.  Patient states today he got up from bed quickly and went to the bathroom.  He felt dizzy and lightheaded and fell to the ground.  He does not think he lost consciousness.  He did hit his head.  After this he developed an episode of gross hematuria.  Some mild pain towards the end of his penis as well while he was urinating.  No difficulty urinating or clot passage.  He is on anticoagulation.  He is not sure if he hit the end of his penis when he fell or not.  He has no pain to his testicles, abdomen, back, chest.  No preceding chest pain or palpitations.  He has had no dizziness since.  No recent vomiting or diarrhea or fevers.  He has been eating and drinking normally without medication change.     Home Medications Prior to Admission medications   Medication Sig Start Date End Date Taking? Authorizing Provider  alfuzosin (UROXATRAL) 10 MG 24 hr tablet Take 1 tablet (10 mg total) by mouth daily with breakfast. 07/25/22   Jeani Sow, MD  ALPHA LIPOIC ACID PO Take 1,000 mg by mouth in the morning.    [provider]  clonazePAM (KLONOPIN) 0.5 MG tablet Take 0.5 mg by mouth 3 (three) times daily as needed.    [provider]  dicyclomine (BENTYL) 10 MG capsule Take 1 capsule (10 mg total) by mouth 2 (two) times daily. 08/18/22   Arnaldo Natal, NP  empagliflozin (JARDIANCE) 25 MG TABS tablet Take 1 tablet (25 mg total) by mouth daily before breakfast. 04/25/22   Jeani Sow, MD  FLUoxetine (PROZAC) 20 MG capsule Take 60 mg by mouth every morning. 12/19/21   [provider]  glipiZIDE  (GLUCOTROL) 10 MG tablet TAKE 2 TABLETS BY MOUTH TWICE DAILY BEFORE A MEAL 04/25/22   Jeani Sow, MD  glucose blood test strip 1 each by Other route daily. Use as instructed checking once per day, or if new symptoms. 05/18/20   Shade Flood, MD  hydrocortisone 2.5 % ointment APPLY TO AFFECTED AREA EVERYDAY AT BEDTIME 08/19/19   Janalyn Harder, MD  hydrOXYzine (ATARAX/VISTARIL) 50 MG tablet Take 50 mg by mouth at bedtime. 12/16/20   [provider]  ibuprofen (ADVIL,MOTRIN) 800 MG tablet Take 1 tablet (800 mg total) by mouth every 8 (eight) hours as needed for pain. 01/09/12   Sherren Mocha, MD  ketoconazole (NIZORAL) 2 % shampoo Apply 1 Application topically 2 (two) times a week. 01/27/22   Jeani Sow, MD  Boris Lown Oil/Astaxanthin 1000 MG CAPS Take 1,000 mg by mouth daily.    [provider]  lisinopril-hydrochlorothiazide (ZESTORETIC) 20-25 MG tablet Take 1 tablet by mouth daily. 04/25/22   Jeani Sow, MD  Magnesium 500 MG TABS Take 500 mg by mouth daily.    [provider]  metFORMIN (GLUCOPHAGE) 1000 MG tablet Take 1 tablet (1,000 mg total) by mouth 2 (two) times daily with a meal. 07/27/22   Jeani Sow, MD  rosuvastatin (CRESTOR) 20 MG tablet Take 1 tablet (20 mg total) by  mouth daily. 04/25/22   Jeani Sow, MD  Thiamine HCl (VITAMIN B-1) 250 MG tablet Take 250 mg by mouth daily.    [provider]  UNABLE TO FIND Med Name: OUTBACK NEUROPATHY CREAM AND WIPES    [provider]      Allergies    Patient has no known allergies.    Review of Systems   Review of Systems Review of systems completed and notable as per HPI.  ROS otherwise negative.   Physical Exam Updated Vital Signs BP (!) 140/72 (BP Location: Right Arm)   Pulse 100   Temp 98.2 F (36.8 C) (Oral)   Resp 18   Ht 5' 10.5" (1.791 m)   Wt 82.6 kg   SpO2 100%   BMI 25.75 kg/m  Physical Exam Vitals and nursing note reviewed. Exam conducted with a chaperone  present.  Constitutional:      General: He is not in acute distress.    Appearance: He is well-developed.  HENT:     Head: Normocephalic and atraumatic.     Mouth/Throat:     Mouth: Mucous membranes are moist.     Pharynx: Oropharynx is clear.  Eyes:     Extraocular Movements: Extraocular movements intact.     Conjunctiva/sclera: Conjunctivae normal.     Pupils: Pupils are equal, round, and reactive to light.  Cardiovascular:     Rate and Rhythm: Normal rate and regular rhythm.     Heart sounds: No murmur heard. Pulmonary:     Effort: Pulmonary effort is normal. No respiratory distress.     Breath sounds: Normal breath sounds.  Abdominal:     Palpations: Abdomen is soft.     Tenderness: There is no abdominal tenderness. There is no guarding or rebound.  Genitourinary:    Testes: Normal.     Comments: Possible small abrasion to the end of the urethra.  No gross hematuria or penile discharge. Musculoskeletal:        General: No swelling.     Cervical back: Neck supple.     Right lower leg: No edema.     Left lower leg: No edema.  Skin:    General: Skin is warm and dry.     Capillary Refill: Capillary refill takes less than 2 seconds.  Neurological:     General: No focal deficit present.     Mental Status: He is alert and oriented to person, place, and time. Mental status is at baseline.     Cranial Nerves: No cranial nerve deficit.     Sensory: No sensory deficit.     Motor: No weakness.  Psychiatric:        Mood and Affect: Mood normal.     ED Results / Procedures / Treatments   Labs (all labs ordered are listed, but only abnormal results are displayed) Labs Reviewed  COMPREHENSIVE METABOLIC PANEL - Abnormal; Notable for the following components:      Result Value   Sodium 134 (*)    CO2 21 (*)    Glucose, Bld 207 (*)    BUN 27 (*)    Total Protein 6.0 (*)    All other components within normal limits  CBC WITH DIFFERENTIAL/PLATELET - Abnormal; Notable for the  following components:   Neutro Abs 8.0 (*)    All other components within normal limits  URINALYSIS, ROUTINE W REFLEX MICROSCOPIC - Abnormal; Notable for the following components:   Specific Gravity, Urine 1.036 (*)    Glucose,  UA >1,000 (*)    Hgb urine dipstick MODERATE (*)    Ketones, ur 40 (*)    All other components within normal limits    EKG EKG Interpretation Date/Time:  Wednesday November 09 2022 10:05:53 EDT Ventricular Rate:  85 PR Interval:  142 QRS Duration:  86 QT Interval:  362 QTC Calculation: 431 R Axis:   66  Text Interpretation: Sinus rhythm Confirmed by Fulton Reek [phone]) on 11/09/2022 10:21:04 AM  Radiology DG Chest 2 View  Result Date: 11/09/2022 CLINICAL DATA:  Fall, presyncope EXAM: CHEST - 2 VIEW COMPARISON:  06/09/2004. FINDINGS: Bilateral lung fields are clear. Bilateral costophrenic angles are clear. Normal cardio-mediastinal silhouette. No acute osseous abnormalities. The soft tissues are within normal limits. IMPRESSION: No active cardiopulmonary disease. Electronically Signed   By: Jules Schick M.D.   On: 11/09/2022 10:30   CT ABDOMEN PELVIS W CONTRAST  Result Date: 11/09/2022 CLINICAL DATA:  Fall with hematuria EXAM: CT ABDOMEN AND PELVIS WITH CONTRAST TECHNIQUE: Multidetector CT imaging of the abdomen and pelvis was performed using the standard protocol following bolus administration of intravenous contrast. RADIATION DOSE REDUCTION: This exam was performed according to the departmental dose-optimization program which includes automated exposure control, adjustment of the mA and/or kV according to patient size and/or use of iterative reconstruction technique. CONTRAST:  OMNIPAQUE IOHEXOL 300 MG/ML  SOLN COMPARISON:  CT scan abdomen from 06/17/2021 and MRI abdomen from 06/30/2022. FINDINGS: Lower chest: The lung bases are clear. No pleural effusion. The heart is normal in size. No pericardial effusion. Hepatobiliary: The liver is normal in size.  Non-cirrhotic configuration. No suspicious mass. There is a stable subcentimeter hypoattenuating structure in the left hepatic lobe, segment 2. No intrahepatic or extrahepatic bile duct dilation. No calcified gallstones. Normal gallbladder wall thickness. No pericholecystic inflammatory changes. Pancreas: No peripancreatic fat stranding. Redemonstration of multiple hypoattenuating structures throughout the pancreas with largest in the pancreatic tail measuring up to 1.2 x 1.6 cm. These are better characterized on the recent MRI from 06/30/2022. However, since the prior study, there is new 4 x 7 mm calcification in the main pancreatic duct in the body with associated increasing dilation of the upstream main pancreatic duct which now measures up to 5 mm, previously measured up to 3 mm. Findings may represent passage of side-branch calculus into the main pancreatic duct. There are additional dystrophic calcifications in the tail and uncinate process, suggesting sequela of chronic pancreatitis. Main pancreatic duct proximal to the calculus is nondilated and similar to the prior study. Spleen: Within normal limits. No focal lesion. Adrenals/Urinary Tract: Adrenal glands are unremarkable. No suspicious renal mass. Stable right renal cysts. No hydronephrosis. No renal or ureteric calculi. Unremarkable urinary bladder. Stomach/Bowel: No disproportionate dilation of the small or large bowel loops. The appendix is unremarkable. There are multiple diverticula throughout the colon, without imaging signs of diverticulitis. Note is made of moderate, smoothly marginated lobular thickening of the third and fourth parts of duodenum. There is no resultant proximal bowel dilation. No periduodenal fat stranding. No surrounding lymphadenopathy. Findings are nonspecific but were present on the prior study from April 13, 2020 as well and represent benign etiology. Further evaluation with endoscopy is recommended. Vascular/Lymphatic: No  ascites or pneumoperitoneum. No abdominal or pelvic lymphadenopathy, by size criteria. No aneurysmal dilation of the major abdominal arteries. There are mild peripheral atherosclerotic vascular calcifications of the aorta and its major branches. Reproductive: Enlarged prostate. Symmetric seminal vesicles. Other: There is a tiny fat containing umbilical hernia.  The soft tissues and abdominal wall are otherwise unremarkable. Musculoskeletal: No suspicious osseous lesions. There are mild multilevel degenerative changes in the visualized spine. IMPRESSION: 1. No acute abnormality in the abdomen or pelvis. Etiology of patient's hematuria is not identified on this examination. 2. New calcification in the pancreatic body with associated increasing dilation of the distal main pancreatic duct, compatible with pancreatolithiasis. Further evaluation with ERCP is recommended. 3. Redemonstration of multiple hypoattenuating structures throughout the pancreas which were better characterized on the recent MRI from 06/30/2022. 4. Redemonstration of moderate, smoothly marginated lobular thickening of the third and fourth parts of duodenum. There is no resultant proximal bowel dilation. No periduodenal fat stranding or lymphadenopathy. Findings are nonspecific but were present on the prior study from April 13, 2020 as well and represent benign etiology. Further evaluation with endoscopy is recommended. Electronically Signed   By: Jules Schick M.D.   On: 11/09/2022 10:29   CT Head Wo Contrast  Result Date: 11/09/2022 CLINICAL DATA:  Fall.  Lightheadedness. EXAM: CT HEAD WITHOUT CONTRAST TECHNIQUE: Contiguous axial images were obtained from the base of the skull through the vertex without intravenous contrast. RADIATION DOSE REDUCTION: This exam was performed according to the departmental dose-optimization program which includes automated exposure control, adjustment of the mA and/or kV according to patient size and/or use of  iterative reconstruction technique. COMPARISON:  None Available. FINDINGS: Brain: No evidence of acute infarction, hemorrhage, hydrocephalus, extra-axial collection or or mass effect. 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 Tiny chronic left cerebellar infarct. Vascular: No hyperdense vessel or unexpected calcification. Skull: Normal. Negative for fracture or focal lesion. Sinuses/Orbits: No acute finding. IMPRESSION: 1. No acute finding or evidence of intracranial injury. 2. 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 Electronically Signed   By: Tiburcio Pea M.D.   On: 11/09/2022 09:27    Procedures Procedures    Medications Ordered in ED Medications  lactated ringers bolus 500 mL (500 mLs Intravenous New Bag/Given 11/09/22 1008)  iohexol (OMNIPAQUE) 300 MG/ML solution 100 mL (100 mLs Intravenous Contrast Given 11/09/22 1610)    ED Course/ Medical Decision Making/ A&P                                 Medical Decision Making Amount and/or Complexity of Data Reviewed Labs: ordered. Radiology: ordered.  Risk Prescription drug management.   Medical Decision Making:   Donnis Snowdon is a 72 y.o. male who presented to the ED today with episode of dizziness and fall.  Vital signs reviewed.  On exam he is well-appearing, currently asymptomatic.  He reports episode of standing up too quickly, feeling lightheaded and then nearly passing out consistent with presyncope likely due to orthostasis.  He had no preceding chest pain or shortness of breath, low concern for PE, ACS.  He did have an episode of reported gross hematuria but no retention after this.  He has possible small abrasion to the end of the urethra which could be causing this.  He does not have any tenderness over his flank or abdomen, however will obtain CT scan to rule out bladder injury or kidney laceration.   No other signs of trauma.   Patient placed on continuous vitals and telemetry monitoring while in ED which was reviewed periodically.  Reviewed and confirmed nursing documentation for past medical  history, family history, social history.  Initial Study Results:   Laboratory  All laboratory results reviewed.  Labs notable for CMP notable for hyperglycemia without evidence of DKA.  Sodium 134.  CBC unremarkable.  Urinalysis with some hemoglobin but no hematuria or signs of infection.  EKG EKG was reviewed independently. Rate, rhythm, axis, intervals all examined and without medically relevant abnormality. ST segments without concerns for elevations.    Radiology:  All images reviewed independently.  Chest x-ray unremarkable.  CT abdomen pelvis without signs of bladder or kidney injury but does have incidental findings regarding the pancreas and duodenum are communicated to patient.  CT head notable for incidental pituitary mass communicated to the patient.  Agree with radiology report at this time.     Reassessment and Plan:   On reassessment patient feels well.  He ambulated to the bathroom without dizziness or any other symptoms.  His hematuria has resolved and urinalysis is reassuring.  CT Abdo pelvis without signs of kidney or renal injury.  He does have incidental findings in his brain, bowels, pancreas which were communicated to him including need for close follow-up with PCP and gastroenterologist and likely need for more imaging.  I did bedside ultrasound after he urinated which did not show any signs of retention.  I suspect his symptoms are related to orthostasis.  He is feeling well and is comfortable going home.  Recommend close PCP follow-up.  Strict return precautions given.  Discharged in stable condition.   Patient's presentation is most consistent with acute presentation with potential threat to life or bodily function.           Final Clinical Impression(s) / ED  Diagnoses Final diagnoses:  Postural dizziness with presyncope    Rx / DC Orders ED Discharge Orders     None         Laurence Spates, MD 11/09/22 1057

## 2022-11-09 NOTE — ED Notes (Signed)
Patient verbalizes understanding of discharge instructions. Opportunity for questioning and answers were provided. Patient discharged from ED.  °

## 2022-11-10 DIAGNOSIS — K859 Acute pancreatitis without necrosis or infection, unspecified: Secondary | ICD-10-CM

## 2022-11-10 NOTE — Telephone Encounter (Signed)
Dr. Meridee Score, this is a prior patient of Dr. Orvan Falconer and I needed your input regarding his CTAP that was done in the ED yesterday which showed  new calcification in the pancreatic body with associated increasing dilation of the distal main pancreatic duct, compatible with pancreatolithiasis. Further evaluation with ERCP is recommended. MRI/MRCP 06/2022 did not show calcifications or PD dilatations or pancreatolithiasis. Can you review his CTAP and let me know if he needs a repeat MRI/MRCP vs ERCP.  Thank you for your input.  [employer]

## 2022-11-10 NOTE — Telephone Encounter (Signed)
CKS, Interesting that it does look like he has developed a pancreatic duct stone. Sounds like he is not having pancreatic type pain however. I think an EUS is reasonable to ensure that nothing else is in place and then discussion can be had as to whether attempt at removal of the pancreatic stone (if still present) will be needed. I think an MRI/MRCP in 2 to 3 weeks makes sense. If he is amenable to the EUS, then we can work on scheduling it but is going to be a few weeks. Thanks. GM

## 2022-11-14 ENCOUNTER — Other Ambulatory Visit: Payer: Self-pay | Admitting: *Deleted

## 2022-11-14 ENCOUNTER — Telehealth: Payer: Self-pay | Admitting: *Deleted

## 2022-11-14 ENCOUNTER — Telehealth: Payer: Self-pay

## 2022-11-14 DIAGNOSIS — K859 Acute pancreatitis without necrosis or infection, unspecified: Secondary | ICD-10-CM

## 2022-11-14 NOTE — Telephone Encounter (Signed)
Viviann Spare, I attempted to call patient at this time but did not reach him directly. I left a detailed msg regarding Dr. Elesa Hacker recommendations. Pls schedule him for an abdominal MRI/MRCP with and without contrast, send to our lab for a BMP 2 days prior to MRI.  I briefly explained scheduling an EUS to further evaluate the pancreas, will need to discuss further with patient, make sure he is willing to pursue prior to scheduling. See mychart msg to patient below. THX.

## 2022-11-14 NOTE — Telephone Encounter (Signed)
Patient called and notified of the ordered ABD MRCP via Ms. Riley Kill, NP. Information also forwarded to Ms. Hilma Favors, RN due the possibility of further orders in relation to a Endoscopic [place].

## 2022-11-14 NOTE — Telephone Encounter (Signed)
CKS, Interesting that it does look like he has developed a pancreatic duct stone. Sounds like he is not having pancreatic type pain however. I think an EUS is reasonable to ensure that nothing else is in place and then discussion can be had as to whether attempt at removal of the pancreatic stone (if still present) will be needed. I think an MRI/MRCP in 2 to 3 weeks makes sense. If he is amenable to the EUS, then we can work on scheduling it but is going to be a few weeks. Thanks. GM

## 2022-11-14 NOTE — Telephone Encounter (Signed)
Pt will be scheduled and called with information.

## 2022-11-15 ENCOUNTER — Other Ambulatory Visit: Payer: Self-pay

## 2022-11-15 ENCOUNTER — Telehealth: Payer: Self-pay

## 2022-11-15 DIAGNOSIS — K8689 Other specified diseases of pancreas: Secondary | ICD-10-CM

## 2022-11-15 DIAGNOSIS — K862 Cyst of pancreas: Secondary | ICD-10-CM

## 2022-11-15 NOTE — Telephone Encounter (Signed)
Transition Care Management Follow-up Telephone Call Date of discharge and from where: 11/09/2022 [facility] How have you been since you were released from the hospital? Patient stated that he is feeling better, his face is healing and the soreness in his back is better. Any questions or concerns? No  Items Reviewed: Did the pt receive and understand the discharge instructions provided? Yes  Medications obtained and verified?  No medication prescribed. Other? No  Any new allergies since your discharge? No  Dietary orders reviewed? Yes Do you have support at home? Yes   Follow up appointments reviewed:  PCP Hospital f/u appt confirmed? Yes  Scheduled to see Bernita Buffy, MD on 11/21/2022 @ [facility]. Specialist Hospital f/u appt confirmed? Yes  Scheduled to see [facility] MRI on 11/24/2022 @ [facility]. Are transportation arrangements needed? No  If their condition worsens, is the pt aware to call PCP or go to the Emergency Dept.? Yes Was the patient provided with contact information for the PCP's office or ED? Yes Was to pt encouraged to call back with questions or concerns? Yes  Raysa Bosak [facility]  [facility] Population Health Community Resource Care Guide   [email or web address]  ?? [phone]   Website: [email or web address]  [email or web address]

## 2022-11-15 NOTE — Telephone Encounter (Signed)
EUS has been scheduled for 12/05/22 at 1045 am at [facility] with GM   Left message on machine to call back

## 2022-11-15 NOTE — Telephone Encounter (Signed)
Transition Care Management Unsuccessful Follow-up Telephone Call  Date of discharge and from where:  11/09/2022 [facility]  Attempts:  1st Attempt  Reason for unsuccessful TCM follow-up call:  Left voice message  Darnice Comrie [facility]  [facility] Population Health Community Resource Care Guide   ??[email or web address]  ?? [phone]   Website: [email or web address]  [email or web address]

## 2022-11-15 NOTE — Telephone Encounter (Signed)
MRI MRCP has been set up per [place] will you let me know if the pt agrees and wishes to proceed with EUS?

## 2022-11-16 NOTE — Telephone Encounter (Signed)
PT returned call  Please advise

## 2022-11-16 NOTE — Telephone Encounter (Signed)
EUS scheduled, pt instructed and medications reviewed.  Patient instructions mailed to home.  Patient to call with any questions or concerns.  

## 2022-11-21 ENCOUNTER — Encounter: Payer: Self-pay | Admitting: Family Medicine

## 2022-11-21 ENCOUNTER — Ambulatory Visit (INDEPENDENT_AMBULATORY_CARE_PROVIDER_SITE_OTHER): Payer: Medicare Other | Admitting: Family Medicine

## 2022-11-21 VITALS — BP 104/64 | HR 97 | Temp 98.2°F | Ht 70.0 in | Wt 176.4 lb

## 2022-11-21 DIAGNOSIS — E782 Mixed hyperlipidemia: Secondary | ICD-10-CM

## 2022-11-21 DIAGNOSIS — I951 Orthostatic hypotension: Secondary | ICD-10-CM

## 2022-11-21 DIAGNOSIS — I10 Essential (primary) hypertension: Secondary | ICD-10-CM | POA: Diagnosis not present

## 2022-11-21 DIAGNOSIS — E1165 Type 2 diabetes mellitus with hyperglycemia: Secondary | ICD-10-CM | POA: Diagnosis not present

## 2022-11-21 DIAGNOSIS — G63 Polyneuropathy in diseases classified elsewhere: Secondary | ICD-10-CM

## 2022-11-21 NOTE — Progress Notes (Signed)
Subjective:     Patient ID: Andrew Tanner, male    DOB: 1950/10/29, 72 y.o.   MRN: [id number]  No chief complaint on file.   HPI DM type 2 w/neuropathy - He is taking Metformin 1000 mg twice daily, Jardiance 25 mg daily, and Glipizide 10 mg bid. Sugars are running 174-178 while fasting. He states this is about 40 points higher than his usual. He is interested in increasing his activity and exercising. Has been walking around his block in the neighborhood. Has resistance bands and 5 lb weights at home that he plans to use more for exercising. Dealing w/a lot right now so sugars elevated.  Doesn't want new meds yet until after procedures done.  Declines insulin  Hypotension- He reports a recent fall while in the bathroom, which he believes is attributed to his low blood pressure. During his fall he states he cut his head. His wife helped him up and he went to bed. When using the bathroom later at night, he reports blood in the urine. States this was the only time he had blood in urine, has not been present since. He presented to the ED that night and was told he may have stood up to quickly, causing his blood pressure to decrease. He was referred to GI at discharge. He reports he has had an abdominal MRIs in the past, which revealed several cysts on his pancreas. No ha/dizziness/cp/palp/edema/cough/sob.doesn't check bp's  Spinal stimulator insertion - He plans to trial a spinal stimulator to help his diabetic neuropathy. He will have the insertion procedure on 8/16. Has had nerve conduction studies before.  Enlarged prostate - He reports urinary frequency. While on a recent trip he struggled with frequency, would use the bathroom about 6-7 times at night. Is compliant with Uroxatral 10 mg daily.   Health Maintenance Due  Topic Date Due   FOOT EXAM  10/05/2022   INFLUENZA VACCINE  11/10/2022   Medicare Annual Wellness (AWV)  12/18/2022    Past Medical History:  Diagnosis Date   Anxiety     Cancer (HCC)    basal cell skin CA   Cataract    Depression    Diabetes mellitus without complication (HCC)    Diabetic neuropathy (HCC)    Hyperlipidemia    Hypertension    IC (interstitial cystitis)    Neuromuscular disorder (HCC) 01/09/21   Peripheral  Neuropathy    Past Surgical History:  Procedure Laterality Date   CATARACT EXTRACTION, BILATERAL     HERNIA REPAIR N/A    inguinal   NECK SURGERY     ant/post   SPINE SURGERY     TRANSURETHRAL RESECTION OF PROSTATE     TURP VAPORIZATION       Current Outpatient Medications:    alfuzosin (UROXATRAL) 10 MG 24 hr tablet, Take 1 tablet (10 mg total) by mouth daily with breakfast., Disp: 90 tablet, Rfl: 1   ALPHA LIPOIC ACID PO, Take 1,000 mg by mouth in the morning., Disp: , Rfl:    clonazePAM (KLONOPIN) 0.5 MG tablet, Take 0.5 mg by mouth 3 (three) times daily as needed., Disp: , Rfl:    dicyclomine (BENTYL) 10 MG capsule, Take 1 capsule (10 mg total) by mouth 2 (two) times daily., Disp: 30 capsule, Rfl: 1   empagliflozin (JARDIANCE) 25 MG TABS tablet, Take 1 tablet (25 mg total) by mouth daily before breakfast., Disp: 90 tablet, Rfl: 3   FLUoxetine (PROZAC) 20 MG capsule, Take 60 mg by mouth  every morning., Disp: , Rfl:    glipiZIDE (GLUCOTROL) 10 MG tablet, TAKE 2 TABLETS BY MOUTH TWICE DAILY BEFORE A MEAL, Disp: 180 tablet, Rfl: 3   glucose blood test strip, 1 each by Other route daily. Use as instructed checking once per day, or if new symptoms., Disp: 50 each, Rfl: 8   hydrocortisone 2.5 % ointment, APPLY TO AFFECTED AREA EVERYDAY AT BEDTIME, Disp: 29 g, Rfl: 0   hydrOXYzine (ATARAX/VISTARIL) 50 MG tablet, Take 50 mg by mouth at bedtime., Disp: , Rfl:    ibuprofen (ADVIL,MOTRIN) 800 MG tablet, Take 1 tablet (800 mg total) by mouth every 8 (eight) hours as needed for pain., Disp: 30 tablet, Rfl: 0   ketoconazole (NIZORAL) 2 % shampoo, Apply 1 Application topically 2 (two) times a week., Disp: 120 mL, Rfl: 3   Krill  Oil/Astaxanthin 1000 MG CAPS, Take 1,000 mg by mouth daily., Disp: , Rfl:    Magnesium 500 MG TABS, Take 500 mg by mouth daily., Disp: , Rfl:    metFORMIN (GLUCOPHAGE) 1000 MG tablet, Take 1 tablet (1,000 mg total) by mouth 2 (two) times daily with a meal., Disp: 180 tablet, Rfl: 1   rosuvastatin (CRESTOR) 20 MG tablet, Take 1 tablet (20 mg total) by mouth daily., Disp: 90 tablet, Rfl: 3   Thiamine HCl (VITAMIN B-1) 250 MG tablet, Take 250 mg by mouth daily., Disp: , Rfl:    UNABLE TO FIND, Med Name: OUTBACK NEUROPATHY CREAM AND WIPES, Disp: , Rfl:   No Known Allergies ROS neg/noncontributory except as noted HPI/below      Objective:     BP 104/64 (BP Location: Left Arm, Patient Position: Sitting, Cuff Size: Normal)   Pulse 97   Temp 98.2 F (36.8 C) (Temporal)   Ht 5\' 10"  (1.778 m)   Wt 176 lb 6.4 oz (80 kg)   SpO2 98%   BMI 25.31 kg/m  Wt Readings from Last 3 Encounters:  11/21/22 176 lb 6.4 oz (80 kg)  11/09/22 182 lb (82.6 kg)  08/18/22 189 lb (85.7 kg)    Physical Exam   Gen: WDWN NAD HEENT: NCAT, conjunctiva not injected, sclera nonicteric NECK:  supple, no thyromegaly, no nodes, no carotid bruits CARDIAC: RRR, S1S2+, no murmur. DP 2+B LUNGS: CTAB. No wheezes ABDOMEN:  BS+, soft, NTND, No HSM, no masses EXT:  no edema MSK: no gross abnormalities.  NEURO: A&O x3.  CN II-XII intact.  PSYCH: normal mood. Good eye contact  Orthostatic VS for the past 72 hrs (Last 3 readings):  Orthostatic BP Patient Position BP Location Cuff Size  11/21/22 0849 (!) 84/60 Standing Left Arm Large  11/21/22 0848 94/60 Standing Left Arm Large  11/21/22 0847 104/56 Sitting Left Arm Large      Assessment & Plan:  Type 2 diabetes mellitus with hyperglycemia, without long-term current use of insulin (HCC) Assessment & Plan: Chronic.  Not controlled for long time-hyperglycemia.  Declines insulin.  Continue jardiance 25mg , glipizide 10mg  bid and metformin 1000mg  bid.  Pt stressed right  now w/upcoming procedures so not want to add januvia yet.  Will continue to work on exercise, etc.  Foot exam deferred to next visit as pt stressed   Polyneuropathy associated with underlying disease (HCC) Assessment & Plan: Chronic   on alternative meds.  Will be getting spinal stim trial soon   Mixed hyperlipidemia Assessment & Plan: Chronic.  Controlled.  Continue crestor 20mg    Primary hypertension Assessment & Plan: Chronic.  Over controlled-orthostatic hypotension.  Has fallen.  Will hold lisinopril/hct.  Monitor bp's daily, if increasing to 140/90, will send lisinopril 5mg  daily.     Orthostatic hypotension    Return in about 3 months (around 02/21/2023) for HTN, DM, cholesterol.   I,Rachel Rivera,acting as a scribe for Angelena Sole, MD.,have documented all relevant documentation on the behalf of Angelena Sole, MD,as directed by  Angelena Sole, MD while in the presence of Angelena Sole, MD.  I, Angelena Sole, MD, have reviewed all documentation for this visit. The documentation on 11/21/22 for the exam, diagnosis, procedures, and orders are all accurate and complete.   Angelena Sole, MD

## 2022-11-21 NOTE — Assessment & Plan Note (Signed)
Chronic.  Controlled.  Continue crestor 20mg 

## 2022-11-21 NOTE — Assessment & Plan Note (Signed)
Chronic   on alternative meds.  Will be getting spinal stim trial soon

## 2022-11-21 NOTE — Assessment & Plan Note (Signed)
Chronic.  Not controlled for long time-hyperglycemia.  Declines insulin.  Continue jardiance 25mg , glipizide 10mg  bid and metformin 1000mg  bid.  Pt stressed right now w/upcoming procedures so not want to add januvia yet.  Will continue to work on exercise, etc.  Foot exam deferred to next visit as pt stressed

## 2022-11-21 NOTE — Patient Instructions (Signed)
It was very nice to see you today!  Get a blood pressure cuff   if blood pressure consistently >140/90, let me know.   Hold the lisinopril/hct for now.     PLEASE NOTE:  If you had any lab tests please let us know if you have not heard back within a few days. You may see your results on MyChart before we have a chance to review them but we will give you a call once they are reviewed by [place]. If we ordered any referrals today, please let us know if you have not heard from their office within the next week.   Please try these tips to maintain a healthy lifestyle:  Eat most of your calories during the day when you are active. Eliminate processed foods including packaged sweets (pies, cakes, cookies), reduce intake of potatoes, white bread, white pasta, and white rice. Look for whole grain options, oat flour or almond flour.  Each meal should contain half fruits/vegetables, one quarter protein, and one quarter carbs (no bigger than a computer mouse).  Cut down on sweet beverages. This includes juice, soda, and sweet tea. Also watch fruit intake, though this is a healthier sweet option, it still contains natural sugar! Limit to 3 servings daily.  Drink at least 1 glass of water with each meal and aim for at least 8 glasses per day  Exercise at least 150 minutes every week.

## 2022-11-21 NOTE — Assessment & Plan Note (Signed)
Chronic.  Over controlled-orthostatic hypotension.  Has fallen.  Will hold lisinopril/hct.  Monitor bp's daily, if increasing to 140/90, will send lisinopril 5mg  daily.

## 2022-11-23 ENCOUNTER — Encounter: Payer: Self-pay | Admitting: Dermatology

## 2022-11-23 ENCOUNTER — Ambulatory Visit: Payer: Medicare Other | Admitting: Dermatology

## 2022-11-23 VITALS — BP 132/81 | HR 91

## 2022-11-23 DIAGNOSIS — L219 Seborrheic dermatitis, unspecified: Secondary | ICD-10-CM

## 2022-11-23 DIAGNOSIS — L570 Actinic keratosis: Secondary | ICD-10-CM | POA: Diagnosis not present

## 2022-11-23 DIAGNOSIS — W908XXA Exposure to other nonionizing radiation, initial encounter: Secondary | ICD-10-CM

## 2022-11-23 MED ORDER — KETOCONAZOLE 2 % EX SHAM: MEDICATED_SHAMPOO | CUTANEOUS | 4 refills | Status: DC

## 2022-11-23 MED ORDER — NYSTATIN-TRIAMCINOLONE 100000-0.1 UNIT/GM-% EX OINT: TOPICAL_OINTMENT | CUTANEOUS | 4 refills | Status: DC

## 2022-11-23 NOTE — Progress Notes (Signed)
New Patient Visit   Subjective  Andrew Tanner is a 72 y.o. male who presents for the following: spots on the scalp  Patient states he has spots located on his scalp that he would like to have examined. Patient reports the areas have been there for a long time. He reports the areas are bothersome. He states that he is getting more. Patient reports have previously been treated for these areas. He said they go in a cycle where they start off as small red spot and scab and go away. He did 5 FU a couple years ago and cleared his skin for a while. 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   Pt was also treated for seb derm by his PCP and given ketoconazole shampoo which does help the scalyness  The following portions of the chart were reviewed this encounter and updated as appropriate: medications, allergies, medical history  Review of Systems:  No other skin or systemic complaints except as noted in HPI or Assessment and Plan.  Objective  Well appearing patient in no apparent distress; mood and affect are within normal limits.   A focused examination was performed of the following areas: scalp  Relevant exam findings are noted in the Assessment and Plan.  Exam: Pink patches with greasy scale at scalp, face   Exam: Erythematous pink papules/ and plaques with gritty scale at the scalp     Assessment & Plan   SEBORRHEIC DERMATITIS  Chronic and persistent condition with duration or expected duration over one year. Condition is bothersome/symptomatic for patient. Currently flared.   Seborrheic Dermatitis is a chronic persistent rash characterized by pinkness and scaling most commonly of the mid face but also can occur on the scalp (dandruff), ears; mid chest, mid back and groin.  It tends to be exacerbated by stress and cooler weather.  People who have  neurologic disease may experience new onset or exacerbation of existing seborrheic dermatitis.  The condition is not curable but treatable and can be controlled.  Treatment Plan: -Start Nystatin/triamcinolone to affected area for 1 week then stop, restart as needed to reduce yeast and inflammation.  -Continue Ketoconazole 2% shampoo to scalp and face 2-3 times a week -Pt advised to manage stress and be cautious of weather changes to avoid flare-ups  ACTINIC KERATOSIS   Actinic keratoses are precancerous spots that appear secondary to cumulative UV radiation exposure/sun exposure over time. They are chronic with expected duration over 1 year. A portion of actinic keratoses will progress to squamous cell carcinoma of the skin. It is not possible to reliably predict which spots will progress to skin cancer and so treatment is recommended to prevent development of skin cancer.  Recommend daily broad spectrum sunscreen SPF 30+ to sun-exposed areas, reapply every 2 hours as needed.  Recommend staying in the shade or wearing long sleeves, sun glasses (UVA+UVB protection) and wide brim hats (4-inch brim around the entire circumference of the hat). Call for new or changing lesions.  -Discussed with pt repeating 5 fluorouricil cream in the winter. -Pt told avoid ketoconazole shampoo for the next week on the scalp where he was treated with LN2. -Pt to schedule a TBSE in the future as he has hx of skin cancer  PROCEDURE NOTE AK (actinic keratosis) (15) Scalp  Destruction of lesion - Scalp (15) Complexity: simple   Destruction  method: cryotherapy   Informed consent: discussed and consent obtained   Timeout:  patient name, date of birth, surgical site, and procedure verified Lesion destroyed using liquid nitrogen: Yes   Post-procedure details: wound care instructions given      Return in about 6 months (around 05/26/2023) for TBSE.  Owens Shark, CMA, am acting as scribe for [employer],  DO.   Documentation: I have reviewed the above documentation for accuracy and completeness, and I agree with the above.  Langston Reusing, DO

## 2022-11-23 NOTE — Patient Instructions (Addendum)
Hello [name],  Thank you for visiting [place] today. We appreciate your commitment to managing your skin health. Here is a summary of the key instructions from today's consultation:  - Medications and Treatments:   - Ketoconazole Shampoo: Continue using on your scalp and begin using on your face to manage seborrheic dermatitis on your face and scalp   - Nystatin Triamcinolone Cream: Apply to your eyebrows, nose, and beard area to reduce yeast and inflammation.    - Actinic Keratosis: Avoid using 5-fluorouracil cream currently due to potential sun exposure. Consideration for starting this treatment in winter or revisiting in January.   - Liquid Nitrogen Treatment: Treated spots on your scalp today. Apply Vaseline morning and night for healing. Avoid using ketoconazole shampoo on these areas for a week.  - Follow-Up Care:   - Skin Cancer Screening: To be scheduled in the future. Focus on the current treatment plan for now.   - Spinal Stimulator Trial: Update [place] on the outcome in January or February.   - Next Appointment: Scheduled for six months from now.  - General Advice:   - Monitor skin condition for new or worsening symptoms.   - Manage stress and be cautious of weather changes to prevent seborrheic dermatitis flare-ups.  Prescriptions for the necessary medications with refills have been sent to your pharmacy. Please do not hesitate to reach out if you have any questions or concerns before our next visit.  Best regards,  Dr. [facility]      Cryotherapy Aftercare  Wash gently with soap and water everyday.   Apply Vaseline and Band-Aid daily until healed.   Due to recent changes in healthcare laws, you may see results of your pathology and/or laboratory studies on MyChart before the doctors have had a chance to review them. We understand that in some cases there may be results that are confusing or concerning to you. Please understand that not all results are  received at the same time and often the doctors may need to interpret multiple results in order to provide you with the best plan of care or course of treatment. Therefore, we ask that you please give [place] 2 business days to thoroughly review all your results before contacting the office for clarification. Should we see a critical lab result, you will be contacted sooner.   If You Need Anything After Your Visit  If you have any questions or concerns for your doctor, please call our main line at [phone] If no one answers, please leave a voicemail as directed and we will return your call as soon as possible. Messages left after 4 pm will be answered the following business day.   You may also send [place] a message via MyChart. We typically respond to MyChart messages within 1-2 business days.  For prescription refills, please ask your pharmacy to contact our office. Our fax number is [phone].  If you have an urgent issue when the clinic is closed that cannot wait until the next business day, you can page your doctor at the number below.    Please note that while we do our best to be available for urgent issues outside of office hours, we are not available 24/7.   If you have an urgent issue and are unable to reach [place], you may choose to seek medical care at your doctor's office, retail clinic, urgent care center, or emergency room.  If you have a medical emergency, please immediately call 911 or go to  the emergency department. In the event of inclement weather, please call our main line at [phone] for an update on the status of any delays or closures.  Dermatology Medication Tips: Please keep the boxes that topical medications come in in order to help keep track of the instructions about where and how to use these. Pharmacies typically print the medication instructions only on the boxes and not directly on the medication tubes.   If your medication is too expensive, please contact our  office at [phone] or send [place] a message through MyChart.   We are unable to tell what your co-pay for medications will be in advance as this is different depending on your insurance coverage. However, we may be able to find a substitute medication at lower cost or fill out paperwork to get insurance to cover a needed medication.   If a prior authorization is required to get your medication covered by your insurance company, please allow [place] 1-2 business days to complete this process.  Drug prices often vary depending on where the prescription is filled and some pharmacies may offer cheaper prices.  The website www.goodrx.com contains coupons for medications through different pharmacies. The prices here do not account for what the cost may be with help from insurance (it may be cheaper with your insurance), but the website can give you the price if you did not use any insurance.  - You can print the associated coupon and take it with your prescription to the pharmacy.  - You may also stop by our office during regular business hours and pick up a GoodRx coupon card.  - If you need your prescription sent electronically to a different pharmacy, notify our office through [facility] or by phone at [phone]

## 2022-11-24 ENCOUNTER — Ambulatory Visit (HOSPITAL_COMMUNITY)
Admission: RE | Admit: 2022-11-24 | Discharge: 2022-11-24 | Disposition: A | Discharge status: Home / Self Care | Payer: Medicare Other | Source: Ambulatory Visit | Attending: Nurse Practitioner | Admitting: Nurse Practitioner

## 2022-11-24 ENCOUNTER — Other Ambulatory Visit: Payer: Self-pay | Admitting: Nurse Practitioner

## 2022-11-24 ENCOUNTER — Encounter (INDEPENDENT_AMBULATORY_CARE_PROVIDER_SITE_OTHER): Payer: Self-pay

## 2022-11-24 DIAGNOSIS — K851 Biliary acute pancreatitis without necrosis or infection: Secondary | ICD-10-CM | POA: Diagnosis not present

## 2022-11-24 DIAGNOSIS — K859 Acute pancreatitis without necrosis or infection, unspecified: Secondary | ICD-10-CM | POA: Diagnosis not present

## 2022-11-24 DIAGNOSIS — N281 Cyst of kidney, acquired: Secondary | ICD-10-CM | POA: Diagnosis not present

## 2022-11-24 DIAGNOSIS — K8689 Other specified diseases of pancreas: Secondary | ICD-10-CM | POA: Diagnosis not present

## 2022-11-24 MED ORDER — GADOBUTROL 1 MMOL/ML IV SOLN
8.0000 mL | Freq: Once | INTRAVENOUS | Status: AC | PRN
  Administered 2022-11-24: 8 mL via INTRAVENOUS

## 2022-11-25 ENCOUNTER — Encounter (HOSPITAL_COMMUNITY): Payer: Self-pay | Admitting: Gastroenterology

## 2022-11-25 DIAGNOSIS — E1140 Type 2 diabetes mellitus with diabetic neuropathy, unspecified: Secondary | ICD-10-CM | POA: Diagnosis not present

## 2022-11-25 DIAGNOSIS — Z4542 Encounter for adjustment and management of neuropacemaker (brain) (peripheral nerve) (spinal cord): Secondary | ICD-10-CM | POA: Diagnosis not present

## 2022-11-28 ENCOUNTER — Other Ambulatory Visit (HOSPITAL_COMMUNITY): Payer: Medicare Other

## 2022-11-28 ENCOUNTER — Encounter (HOSPITAL_COMMUNITY): Payer: Self-pay | Admitting: Gastroenterology

## 2022-12-02 DIAGNOSIS — E1140 Type 2 diabetes mellitus with diabetic neuropathy, unspecified: Secondary | ICD-10-CM | POA: Diagnosis not present

## 2022-12-05 ENCOUNTER — Encounter (HOSPITAL_COMMUNITY)
Admission: RE | Disposition: A | Discharge status: Home / Self Care | Payer: Self-pay | Source: Home / Self Care | Attending: Gastroenterology

## 2022-12-05 ENCOUNTER — Ambulatory Visit (HOSPITAL_COMMUNITY): Payer: Medicare Other

## 2022-12-05 ENCOUNTER — Ambulatory Visit (HOSPITAL_COMMUNITY)
Admission: RE | Admit: 2022-12-05 | Discharge: 2022-12-05 | Disposition: A | Discharge status: Home / Self Care | Payer: Medicare Other | Attending: Gastroenterology | Admitting: Gastroenterology

## 2022-12-05 ENCOUNTER — Other Ambulatory Visit: Payer: Self-pay

## 2022-12-05 ENCOUNTER — Encounter (HOSPITAL_COMMUNITY): Payer: Self-pay | Admitting: Gastroenterology

## 2022-12-05 ENCOUNTER — Ambulatory Visit (HOSPITAL_BASED_OUTPATIENT_CLINIC_OR_DEPARTMENT_OTHER): Payer: Medicare Other

## 2022-12-05 DIAGNOSIS — K869 Disease of pancreas, unspecified: Secondary | ICD-10-CM | POA: Insufficient documentation

## 2022-12-05 DIAGNOSIS — Q399 Congenital malformation of esophagus, unspecified: Secondary | ICD-10-CM | POA: Diagnosis not present

## 2022-12-05 DIAGNOSIS — E1165 Type 2 diabetes mellitus with hyperglycemia: Secondary | ICD-10-CM | POA: Diagnosis not present

## 2022-12-05 DIAGNOSIS — K8689 Other specified diseases of pancreas: Secondary | ICD-10-CM | POA: Diagnosis not present

## 2022-12-05 DIAGNOSIS — K2950 Unspecified chronic gastritis without bleeding: Secondary | ICD-10-CM | POA: Insufficient documentation

## 2022-12-05 DIAGNOSIS — D0149 Carcinoma in situ of other parts of intestine: Secondary | ICD-10-CM | POA: Diagnosis not present

## 2022-12-05 DIAGNOSIS — K2970 Gastritis, unspecified, without bleeding: Secondary | ICD-10-CM

## 2022-12-05 DIAGNOSIS — I899 Noninfective disorder of lymphatic vessels and lymph nodes, unspecified: Secondary | ICD-10-CM

## 2022-12-05 DIAGNOSIS — R932 Abnormal findings on diagnostic imaging of liver and biliary tract: Secondary | ICD-10-CM | POA: Insufficient documentation

## 2022-12-05 DIAGNOSIS — K3189 Other diseases of stomach and duodenum: Secondary | ICD-10-CM | POA: Insufficient documentation

## 2022-12-05 DIAGNOSIS — D132 Benign neoplasm of duodenum: Secondary | ICD-10-CM | POA: Insufficient documentation

## 2022-12-05 DIAGNOSIS — Z87891 Personal history of nicotine dependence: Secondary | ICD-10-CM | POA: Insufficient documentation

## 2022-12-05 DIAGNOSIS — K449 Diaphragmatic hernia without obstruction or gangrene: Secondary | ICD-10-CM | POA: Insufficient documentation

## 2022-12-05 DIAGNOSIS — K317 Polyp of stomach and duodenum: Secondary | ICD-10-CM

## 2022-12-05 DIAGNOSIS — K2289 Other specified disease of esophagus: Secondary | ICD-10-CM | POA: Diagnosis not present

## 2022-12-05 DIAGNOSIS — Z7984 Long term (current) use of oral hypoglycemic drugs: Secondary | ICD-10-CM | POA: Insufficient documentation

## 2022-12-05 DIAGNOSIS — K860 Alcohol-induced chronic pancreatitis: Secondary | ICD-10-CM | POA: Diagnosis not present

## 2022-12-05 DIAGNOSIS — K861 Other chronic pancreatitis: Secondary | ICD-10-CM | POA: Diagnosis not present

## 2022-12-05 DIAGNOSIS — K862 Cyst of pancreas: Secondary | ICD-10-CM

## 2022-12-05 DIAGNOSIS — I10 Essential (primary) hypertension: Secondary | ICD-10-CM | POA: Diagnosis not present

## 2022-12-05 HISTORY — PX: ESOPHAGOGASTRODUODENOSCOPY: SHX5428

## 2022-12-05 HISTORY — PX: FINE NEEDLE ASPIRATION: SHX5430

## 2022-12-05 HISTORY — PX: SUBMUCOSAL TATTOO INJECTION: SHX6856

## 2022-12-05 HISTORY — PX: BIOPSY: SHX5522

## 2022-12-05 HISTORY — PX: EUS: SHX5427

## 2022-12-05 LAB — GLUCOSE, CAPILLARY: Glucose-Capillary: 181 mg/dL — ABNORMAL HIGH (ref 70–99)

## 2022-12-05 SURGERY — UPPER ENDOSCOPIC ULTRASOUND (EUS) RADIAL
Anesthesia: Monitor Anesthesia Care

## 2022-12-05 MED ORDER — PROPOFOL 10 MG/ML IV BOLUS
INTRAVENOUS | Status: DC | PRN
Start: 2022-12-05 — End: 2022-12-05
  Administered 2022-12-05: 50 mg via INTRAVENOUS
  Administered 2022-12-05: 100 mg via INTRAVENOUS
  Administered 2022-12-05: 50 mg via INTRAVENOUS

## 2022-12-05 MED ORDER — CIPROFLOXACIN HCL 500 MG PO TABS: 500.0000 mg | ORAL_TABLET | Freq: Two times a day (BID) | ORAL | 0 refills | Status: AC

## 2022-12-05 MED ORDER — LACTATED RINGERS IV SOLN
INTRAVENOUS | Status: DC
  Administered 2022-12-05: 1000 mL via INTRAVENOUS

## 2022-12-05 MED ORDER — PROPOFOL 10 MG/ML IV BOLUS
INTRAVENOUS | Status: AC
  Filled 2022-12-05: qty 20

## 2022-12-05 MED ORDER — SODIUM CHLORIDE 0.9 % IV SOLN: INTRAVENOUS | Status: DC

## 2022-12-05 MED ORDER — CIPROFLOXACIN IN D5W 400 MG/200ML IV SOLN
INTRAVENOUS | Status: DC | PRN
  Administered 2022-12-05: 400 mg via INTRAVENOUS

## 2022-12-05 MED ORDER — SPOT INK MARKER SYRINGE KIT
PACK | SUBMUCOSAL | Status: DC | PRN
Start: 2022-12-05 — End: 2022-12-05
  Administered 2022-12-05: 3 mL via SUBMUCOSAL

## 2022-12-05 MED ORDER — PROPOFOL 500 MG/50ML IV EMUL
INTRAVENOUS | Status: AC
  Filled 2022-12-05: qty 50

## 2022-12-05 MED ORDER — PROPOFOL 500 MG/50ML IV EMUL
INTRAVENOUS | Status: DC | PRN
  Administered 2022-12-05: 120 ug/kg/min via INTRAVENOUS

## 2022-12-05 MED ORDER — PHENYLEPHRINE 80 MCG/ML (10ML) SYRINGE FOR IV PUSH (FOR BLOOD PRESSURE SUPPORT)
PREFILLED_SYRINGE | INTRAVENOUS | Status: DC | PRN
  Administered 2022-12-05: 80 ug via INTRAVENOUS

## 2022-12-05 MED ORDER — CIPROFLOXACIN IN D5W 400 MG/200ML IV SOLN
INTRAVENOUS | Status: AC
  Filled 2022-12-05: qty 200

## 2022-12-05 NOTE — Discharge Instructions (Signed)

## 2022-12-05 NOTE — Anesthesia Procedure Notes (Signed)
Procedure Name: MAC Date/Time: 12/05/2022 11:40 AM  Performed by: Maurene Capes, CRNAPre-anesthesia Checklist: Patient identified, Emergency Drugs available, Suction available and Patient being monitored Patient Re-evaluated:Patient Re-evaluated prior to induction Induction Type: IV induction Placement Confirmation: positive ETCO2 Dental Injury: Teeth and Oropharynx as per pre-operative assessment

## 2022-12-05 NOTE — Op Note (Signed)
[facility] Patient Name: Andrew Tanner Procedure Date: 12/05/2022 MRN: [id number] Attending MD: Corliss Parish , MD, [id number] Date of Birth: 10/31/1950 CSN: [id number] Age: 72 Admit Type: Outpatient Procedure:                Upper EUS Indications:              Dilated pancreatic duct on MRCP, Suspected chronic                            pancreatitis, Abdominal distress in the right upper                            quadrant Providers:                Corliss Parish, MD, Eliberto Ivory, RN, Leanne Lovely, Technician Referring MD:             Jeani Sow, Arnaldo Natal, Scott E.                            Tomasa Rand, MD Medicines:                Monitored Anesthesia Care, Cipro 400 mg IV Complications:            No immediate complications. Estimated Blood Loss:     Estimated blood loss was minimal. Procedure:                Pre-Anesthesia Assessment:                           - Prior to the procedure, a History and Physical                            was performed, and patient medications and                            allergies were reviewed. The patient's tolerance of                            previous anesthesia was also reviewed. The risks                            and benefits of the procedure and the sedation                            options and risks were discussed with the patient.                            All questions were answered, and informed consent                            was obtained. Prior Anticoagulants: The patient has  taken no anticoagulant or antiplatelet agents                            except for NSAID medication. ASA Grade Assessment:                            III - A patient with severe systemic disease. After                            reviewing the risks and benefits, the patient was                            deemed in satisfactory condition to undergo  the                            procedure.                           After obtaining informed consent, the endoscope was                            passed under direct vision. Throughout the                            procedure, the patient's blood pressure, pulse, and                            oxygen saturations were monitored continuously. The                            [id number]-H190 ([id number]) Olympus endoscope was introduced                            through the mouth, and advanced to the second part                            of duodenum. The upper EUS was accomplished without                            difficulty. The patient tolerated the procedure.                            The PCF-HQ190L ([id number]) Olympus colonoscope was                            introduced through the mouth, and advanced to the                            jejunum. The [id number]-Q190V ([id number]) Olympus                            duodenoscope was introduced through the mouth, and  advanced to the area of papilla. The GF-[id number]                            ([id number]) Olympus linear ultrasound scope was                            introduced through the mouth, and advanced to the                            duodenum for ultrasound examination from the                            stomach and duodenum. Scope In: Scope Out: Findings:      ENDOSCOPIC FINDING: :      The examined esophagus was moderately tortuous.      The Z-line was irregular and was found 39 cm from the incisors.      A 1 cm hiatal hernia was present.      Patchy mild inflammation characterized by erosions, erythema and       granularity was found in the entire examined stomach. Biopsies were       taken with a cold forceps for histology and Helicobacter pylori testing.      Localized mildly erythematous mucosa without active bleeding was found       in the duodenal bulb.      The major papilla was normal.      A circumferential 7-8 cm  semi-sessile polypoid lesion with no bleeding       was found in the second/third portion of the duodenum. Majority of the       lesion looks to be premalignant again at [facility]. Biopsies were taken       with a cold forceps for histology. Area proximal and distal to the       lesion was tattooed with an injection of Spot (carbon black) for       demarcation purposes.      Normal mucosa was found in the examined proximal jejunum.      ENDOSONOGRAPHIC FINDING: :      Pancreatic parenchymal abnormalities were noted in the entire pancreas.       These consisted of atrophy, hyperechoic foci with shadowing, lobularity       without honeycombing and cysts.      The pancreatic duct was normal in appearance within the head and neck of       the pancreas. Within the neck/body, evidence of a large 7 mm x 6 mm       intraductal stone was found within the duct. This led to immediate       dilation of the upstream remaining pancreas duct. Sidebranch ductal       dilation was noted. Tortuosity/ectatic appearance was noted. Hyperechoic       walls were noted throughout the entirety of the pancreas duct. The       pancreatic duct in the head measured, the pancreatic duct in the neck       measured, the pancreatic duct in the body measured, pancreatic duct in       the tail measured.      An anechoic lesion suggestive of a cyst was identified in the genu/body       region of the pancreas. It  is not in obvious communication with the       pancreatic duct. The lesion measured 20 mm by 17 mm in maximal       cross-sectional diameter. There were 3 compartments thinly septated. The       outer wall of the lesion was thin. There was no associated mass. There       was no internal debris within the fluid-filled cavity. It is important       to mention that this lesion was just proximal to the pancreatic duct       stone noted above. Diagnostic needle aspiration for fluid was performed.       Color Doppler imaging  was utilized prior to needle puncture to confirm a       lack of significant vascular structures within the needle path. One pass       was made with the [facility] Scientific expect 22 gauge needle using a       transgastric approach. A stylet was used. The amount of fluid collected       was 3 mL. The fluid was clear, white and slightly viscous. Samples were       sent for glucose, amylase concentration, cytology and CEA.      Another anechoic lesion suggestive of a cyst was identified in the       pancreatic tail. It is not in obvious communication with the pancreatic       duct. The lesion measured 13 mm by 12 mm in maximal cross-sectional       diameter. There was a single compartment without septae. The outer wall       of the lesion was thin. There was no associated mass. There was no       internal debris within the fluid-filled cavity.      There was no sign of significant endosonographic abnormality in the       common bile duct (4.5 mm) and in the common hepatic duct (6.1 mm). An       unremarkable gallbladder and ducts with regular contour were identified.      Endosonographic imaging of the ampulla showed no intramural       (subepithelial) lesion.      Endosonographic imaging in the visualized portion of the liver showed no       mass.      No malignant-appearing lymph nodes were visualized in the celiac region       (level 20), peripancreatic region and porta hepatis region.      The celiac region was visualized. Impression:               EGD impression:                           - Tortuous esophagus. Z-line irregular, 39 cm from                            the incisors.                           - 1 cm hiatal hernia.                           - Gastritis. Biopsied.                           -  Erythematous duodenopathy in the bulb.                           - Normal major papilla.                           - A large circumferential 7 to 8 cm duodenal                             polypoid lesion was noted in the D2/D3 region.                            Biopsied. Tattooed proximal and distal to the                            lesion.                           - Normal mucosa was found in the visualized                            proximal jejunum.                           EUS impression:                           - Pancreatic parenchymal abnormalities consisting                            of atrophy, hyperechoic foci, lobularity and cysts                            were noted in the entire pancreas.                           - The pancreatic duct was normal in appearance                            within the head/neck region. Subsequent dilation                            noted with intraductal pancreatic stone in the                            neck/body transition. Upstream dilation noted.                            Prominent sidebranches and tortuous/ectatic                            appearance within the rest of the pancreas duct.                           - A cystic lesion was seen in the genu/body of the  pancreas just proximal to the pancreatic duct stone                            noted above. Cytology results are pending. 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                            Fine needle aspiration for fluid performed.                           - A cystic lesion was seen in the pancreatic tail.                            Tissue has not been obtained. 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                           - There was no sign of significant pathology in the                            common bile duct and in the common hepatic duct.                            The  gallbladder was normal in appearance.                           - No malignant-appearing lymph nodes were                            visualized in the celiac region (level 20),                            peripancreatic region and porta hepatis region. Moderate Sedation:      Not Applicable - Patient had care per Anesthesia. Recommendation:           - The patient will be observed post-procedure,                            until all discharge criteria are met.                           - Discharge patient to home.                           -  Patient has a contact number available for                            emergencies. The signs and symptoms of potential                            delayed complications were discussed with the                            patient. Return to normal activities tomorrow.                            Written discharge instructions were provided to the                            patient.                           - Resume previous diet.                           - Observe patient's clinical course.                           - Await path results.                           - Depending on results of pathology, if malignancy                            is found then staging imaging will need to be                            pursued. If nonmalignant, referral to surgery for                            resection discussion. I think this is too large for                            an attempt at endoscopic submucosal dissection in                            this particular area of the duodenum.                           - Follow-up cytology results from pancreatic fluid                            analysis.                           - Ciprofloxacin 500 mg twice daily for 3 days to                            decrease post interventional infectious risk.                           -  Continue present medications otherwise.                           - Monitor for  signs/symptoms of                            pancreatitis/perforation/infection.                           - The findings and recommendations were discussed                            with the patient.                           - The findings and recommendations were discussed                            with the patient's family. Procedure Code(s):        --- Professional ---                           [phone], Esophagogastroduodenoscopy, flexible,                            transoral; with transendoscopic ultrasound-guided                            intramural or transmural fine needle                            aspiration/biopsy(s), (includes endoscopic                            ultrasound examination limited to the esophagus,                            stomach or duodenum, and adjacent structures)                           43239, 59, Esophagogastroduodenoscopy, flexible,                            transoral; with biopsy, single or multiple                           43236, 59, Esophagogastroduodenoscopy, flexible,                            transoral; with directed submucosal injection(s),                            any substance Diagnosis Code(s):        --- Professional ---                           Q39.9, Congenital malformation of esophagus,  unspecified                           K22.89, Other specified disease of esophagus                           K44.9, Diaphragmatic hernia without obstruction or                            gangrene                           K29.70, Gastritis, unspecified, without bleeding                           K31.89, Other diseases of stomach and duodenum                           K31.7, Polyp of stomach and duodenum                           K86.9, Disease of pancreas, unspecified                           K86.2, Cyst of pancreas                           K86.89, Other specified diseases of pancreas                           R93.3,  Abnormal findings on diagnostic imaging of                            other parts of digestive tract                           I89.9, Noninfective disorder of lymphatic vessels                            and lymph nodes, unspecified                           R10.11, Right upper quadrant pain CPT copyright 2022 American Medical Association. All rights reserved. The codes documented in this report are preliminary and upon coder review may  be revised to meet current compliance requirements. Corliss Parish, MD 12/05/2022 12:48:49 PM Number of Addenda: 0

## 2022-12-05 NOTE — Transfer of Care (Signed)
Immediate Anesthesia Transfer of Care Note  Patient: Andrew Tanner  Procedure(s) Performed: UPPER ENDOSCOPIC ULTRASOUND (EUS) RADIAL ESOPHAGOGASTRODUODENOSCOPY (EGD) BIOPSY SUBMUCOSAL TATTOO INJECTION  Patient Location: Endoscopy Unit  Anesthesia Type:MAC  Level of Consciousness: awake and patient cooperative  Airway & Oxygen Therapy: Patient Spontanous Breathing and Patient connected to face mask  Post-op Assessment: Report given to RN and Post -op Vital signs reviewed and stable  Post vital signs: Reviewed and stable  Last Vitals:  Vitals Value Taken Time  BP    Temp    Pulse 73 12/05/22 1235  Resp 20 12/05/22 1235  SpO2 100 % 12/05/22 1235  Vitals shown include unfiled device data.  Last Pain:  Vitals:   12/05/22 1009  TempSrc: Temporal  PainSc: 1          Complications: No notable events documented.

## 2022-12-05 NOTE — Anesthesia Postprocedure Evaluation (Signed)
Anesthesia Post Note  Patient: Petra Whittiker  Procedure(s) Performed: UPPER ENDOSCOPIC ULTRASOUND (EUS) RADIAL ESOPHAGOGASTRODUODENOSCOPY (EGD) BIOPSY SUBMUCOSAL TATTOO INJECTION FINE NEEDLE ASPIRATION (FNA) LINEAR     Patient location during evaluation: Endoscopy Anesthesia Type: MAC Level of consciousness: awake Pain management: pain level controlled Vital Signs Assessment: post-procedure vital signs reviewed and stable Respiratory status: spontaneous breathing Cardiovascular status: stable Postop Assessment: no apparent nausea or vomiting Anesthetic complications: no  No notable events documented.  Last Vitals:  Vitals:   12/05/22 1300 12/05/22 1301  BP: 122/76 122/76  Pulse: 65 65  Resp: 13 13  Temp:    SpO2: 100% 99%    Last Pain:  Vitals:   12/05/22 1240  TempSrc:   PainSc: 0-No pain                 Caren Macadam

## 2022-12-05 NOTE — Anesthesia Preprocedure Evaluation (Signed)
Anesthesia Evaluation  Patient identified by MRN, date of birth, ID band Patient awake    Airway Mallampati: I       Dental no notable dental hx.    Pulmonary former smoker   Pulmonary exam normal        Cardiovascular Normal cardiovascular exam     Neuro/Psych  PSYCHIATRIC DISORDERS Anxiety Depression     Neuromuscular disease    GI/Hepatic Neg liver ROS,,,  Endo/Other  diabetes, Poorly Controlled, Type 2, Oral Hypoglycemic Agents    Renal/GU negative Renal ROS  negative genitourinary   Musculoskeletal negative musculoskeletal ROS (+)    Abdominal Normal abdominal exam  (+)   Peds  Hematology   Anesthesia Other Findings   Reproductive/Obstetrics                             Anesthesia Physical Anesthesia Plan  ASA: 2  Anesthesia Plan: MAC   Post-op Pain Management:    Induction:   PONV Risk Score and Plan: Propofol infusion and Treatment may vary due to age or medical condition  Airway Management Planned: Natural Airway and Mask  Additional Equipment: None  Intra-op Plan:   Post-operative Plan:   Informed Consent: I have reviewed the patients History and Physical, chart, labs and discussed the procedure including the risks, benefits and alternatives for the proposed anesthesia with the patient or authorized representative who has indicated his/her understanding and acceptance.       Plan Discussed with: CRNA  Anesthesia Plan Comments:        Anesthesia Quick Evaluation

## 2022-12-05 NOTE — H&P (Signed)
GASTROENTEROLOGY PROCEDURE H&P NOTE   Primary Care Physician: Jeani Sow, MD  HPI: Andrew Tanner is a 72 y.o. male who presents for EGD/EUS to evaluate abnormal Pancreas duct dilation and pancreatic cysts.  Past Medical History:  Diagnosis Date   Anxiety    Basal cell carcinoma    Cancer (HCC)    basal cell skin CA   Cataract    Depression    Diabetes mellitus without complication (HCC)    Diabetic neuropathy (HCC)    Hyperlipidemia    Hypertension    IC (interstitial cystitis)    Neuromuscular disorder (HCC) 01/09/21   Peripheral  Neuropathy   Squamous cell carcinoma of skin    Past Surgical History:  Procedure Laterality Date   CATARACT EXTRACTION, BILATERAL     HERNIA REPAIR N/A    inguinal   NECK SURGERY     ant/post   SPINE SURGERY     TRANSURETHRAL RESECTION OF PROSTATE     TURP VAPORIZATION     Current Facility-Administered Medications  Medication Dose Route Frequency Provider Last Rate Last Admin   0.9 %  sodium chloride infusion   Intravenous Continuous Mansouraty, Netty Starring., MD       lactated ringers infusion   Intravenous Continuous Mansouraty, Netty Starring., MD 10 mL/hr at 12/05/22 1032 1,000 mL at 12/05/22 1032    Current Facility-Administered Medications:    0.9 %  sodium chloride infusion, , Intravenous, Continuous, Mansouraty, Netty Starring., MD   lactated ringers infusion, , Intravenous, Continuous, Mansouraty, Netty Starring., MD, Last Rate: 10 mL/hr at 12/05/22 1032, 1,000 mL at 12/05/22 1032 No Known Allergies Family History  Problem Relation Age of Onset   Lung cancer Mother    Heart disease Father    Hyperlipidemia Father    Diabetes Father        type 1   Diabetes Brother    Depression Brother    Renal cancer Brother    Anxiety disorder Brother    Stomach cancer Neg Hx    Colon cancer Neg Hx    Pancreatic cancer Neg Hx    Esophageal cancer Neg Hx    Rectal cancer Neg Hx    Social History   Socioeconomic History   Marital  status: Married    Spouse name: Marilynne Drivers   Number of children: 1   Years of education: Not on file   Highest education level: Bachelor's degree (e.g., BA, AB, BS)  Occupational History   Not on file  Tobacco Use   Smoking status: Former    Current packs/day: 0.00    Average packs/day: 1 pack/day for 5.0 years (5.0 ttl pk-yrs)    Types: Cigarettes    Start date: 03/02/1977    Quit date: 03/02/1982    Years since quitting: 40.7   Smokeless tobacco: Never   Tobacco comments:    quit 28 yrs ago  Vaping Use   Vaping status: Never Used  Substance and Sexual Activity   Alcohol use: Not Currently    Comment: none   Drug use: No   Sexual activity: Not Currently    Birth control/protection: Abstinence  Other Topics Concern   Not on file  Social History Narrative   Retired Risk analyst, 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   Social Determinants of Health   Financial Resource Strain: Low Risk  (07/24/2022)   Overall Financial Resource Strain (CARDIA)    Difficulty of Paying Living Expenses: Not very hard  Food Insecurity: Patient  Declined (07/24/2022)   Hunger Vital Sign    Worried About Running Out of Food in the Last Year: Patient declined    Ran Out of Food in the Last Year: Patient declined  Transportation Needs: No Transportation Needs (07/24/2022)   PRAPARE - Administrator, Civil Service (Medical): No    Lack of Transportation (Non-Medical): No  Physical Activity: Sufficiently Active (07/24/2022)   Exercise Vital Sign    Days of Exercise per Week: 7 days    Minutes of Exercise per Session: 30 min  Stress: No Stress Concern Present (07/24/2022)   [employer] of Occupational Health - Occupational Stress Questionnaire    Feeling of Stress : Not at all  Social Connections: Socially Integrated (12/17/2021)   Social Connection and Isolation Panel [NHANES]    Frequency of Communication with Friends and Family: More than three times a week    Frequency of  Social Gatherings with Friends and Family: More than three times a week    Attends Religious Services: More than 4 times per year    Active Member of [employer] or Organizations: Yes    Attends Banker Meetings: 1 to 4 times per year    Marital Status: Married  Catering manager Violence: Not At Risk (12/17/2021)   Humiliation, Afraid, Rape, and Kick questionnaire    Fear of Current or Ex-Partner: No    Emotionally Abused: No    Physically Abused: No    Sexually Abused: No    Physical Exam: Today's Vitals   12/05/22 1009  BP: (!) 142/67  Pulse: 81  Resp: 15  Temp: 98 F (36.7 C)  TempSrc: Temporal  SpO2: 97%  Weight: 83 kg  Height: 5\' 11"  (1.803 m)  PainSc: 1    Body mass index is 25.52 kg/m. GEN: NAD EYE: Sclerae anicteric ENT: MMM CV: Non-tachycardic GI: Soft, NT/ND NEURO:  Alert & Oriented x 3  Lab Results: No results for input(s): "WBC", "HGB", "HCT", "PLT" in the last 72 hours. BMET No results for input(s): "NA", "K", "CL", "CO2", "GLUCOSE", "BUN", "CREATININE", "CALCIUM" in the last 72 hours. LFT No results for input(s): "PROT", "ALBUMIN", "AST", "ALT", "ALKPHOS", "BILITOT", "BILIDIR", "IBILI" in the last 72 hours. PT/INR No results for input(s): "LABPROT", "INR" in the last 72 hours.   Impression / Plan: This is a 72 y.o.male who presents for EGD/EUS to evaluate abnormal Pancreas duct dilation and pancreatic cysts.  The risks of an EUS including intestinal perforation, bleeding, infection, aspiration, and medication effects were discussed as was the possibility it may not give a definitive diagnosis if a biopsy is performed.  When a biopsy of the pancreas is done as part of the EUS, there is an additional risk of pancreatitis at the rate of about 1-2%.  It was explained that procedure related pancreatitis is typically mild, although it can be severe and even life threatening, which is why we do not perform random pancreatic biopsies and only biopsy a  lesion/area we feel is concerning enough to warrant the risk.   The risks and benefits of endoscopic evaluation/treatment were discussed with the patient and/or family; these include but are not limited to the risk of perforation, infection, bleeding, missed lesions, lack of diagnosis, severe illness requiring hospitalization, as well as anesthesia and sedation related illnesses.  The patient's history has been reviewed, patient examined, no change in status, and deemed stable for procedure.  The patient and/or family is agreeable to proceed.    Corliss Parish, MD  [facility] Advanced Endoscopy Office # [phone]

## 2022-12-06 ENCOUNTER — Encounter (HOSPITAL_COMMUNITY): Payer: Self-pay | Admitting: Gastroenterology

## 2022-12-07 ENCOUNTER — Other Ambulatory Visit: Payer: Self-pay

## 2022-12-07 ENCOUNTER — Other Ambulatory Visit: Payer: Medicare Other

## 2022-12-07 DIAGNOSIS — K862 Cyst of pancreas: Secondary | ICD-10-CM

## 2022-12-07 DIAGNOSIS — K8689 Other specified diseases of pancreas: Secondary | ICD-10-CM

## 2022-12-07 LAB — SURGICAL PATHOLOGY

## 2022-12-07 MED ORDER — DICYCLOMINE HCL 10 MG PO CAPS: 10.0000 mg | ORAL_CAPSULE | Freq: Three times a day (TID) | ORAL | 0 refills | Status: DC

## 2022-12-07 NOTE — Telephone Encounter (Signed)
Patient stopped by office to pick up kit from lab, stopped by check in to state he is experiencing belching and a "hardness" in his stomach. He is worried these symptoms are not normal. He is requesting a call back to discuss.

## 2022-12-07 NOTE — Telephone Encounter (Signed)
See results note dated 8/28

## 2022-12-08 ENCOUNTER — Emergency Department (HOSPITAL_BASED_OUTPATIENT_CLINIC_OR_DEPARTMENT_OTHER)
Admission: EM | Admit: 2022-12-08 | Discharge: 2022-12-08 | Disposition: A | Discharge status: Home / Self Care | Payer: Medicare Other | Attending: Emergency Medicine | Admitting: Emergency Medicine

## 2022-12-08 ENCOUNTER — Emergency Department (HOSPITAL_BASED_OUTPATIENT_CLINIC_OR_DEPARTMENT_OTHER): Payer: Medicare Other

## 2022-12-08 ENCOUNTER — Other Ambulatory Visit: Payer: Self-pay

## 2022-12-08 ENCOUNTER — Other Ambulatory Visit (HOSPITAL_BASED_OUTPATIENT_CLINIC_OR_DEPARTMENT_OTHER): Payer: Self-pay

## 2022-12-08 ENCOUNTER — Encounter (HOSPITAL_BASED_OUTPATIENT_CLINIC_OR_DEPARTMENT_OTHER): Payer: Self-pay | Admitting: Emergency Medicine

## 2022-12-08 DIAGNOSIS — N281 Cyst of kidney, acquired: Secondary | ICD-10-CM | POA: Diagnosis not present

## 2022-12-08 DIAGNOSIS — K859 Acute pancreatitis without necrosis or infection, unspecified: Secondary | ICD-10-CM | POA: Diagnosis not present

## 2022-12-08 DIAGNOSIS — N400 Enlarged prostate without lower urinary tract symptoms: Secondary | ICD-10-CM | POA: Diagnosis not present

## 2022-12-08 DIAGNOSIS — Z79899 Other long term (current) drug therapy: Secondary | ICD-10-CM | POA: Diagnosis not present

## 2022-12-08 DIAGNOSIS — R1013 Epigastric pain: Secondary | ICD-10-CM | POA: Diagnosis not present

## 2022-12-08 DIAGNOSIS — E1140 Type 2 diabetes mellitus with diabetic neuropathy, unspecified: Secondary | ICD-10-CM | POA: Diagnosis not present

## 2022-12-08 DIAGNOSIS — I10 Essential (primary) hypertension: Secondary | ICD-10-CM | POA: Insufficient documentation

## 2022-12-08 DIAGNOSIS — K5730 Diverticulosis of large intestine without perforation or abscess without bleeding: Secondary | ICD-10-CM | POA: Diagnosis not present

## 2022-12-08 DIAGNOSIS — K8689 Other specified diseases of pancreas: Secondary | ICD-10-CM | POA: Diagnosis not present

## 2022-12-08 DIAGNOSIS — R109 Unspecified abdominal pain: Secondary | ICD-10-CM

## 2022-12-08 DIAGNOSIS — K858 Other acute pancreatitis without necrosis or infection: Secondary | ICD-10-CM | POA: Insufficient documentation

## 2022-12-08 LAB — CBC WITH DIFFERENTIAL/PLATELET
Abs Immature Granulocytes: 0.05 10*3/uL (ref 0.00–0.07)
Basophils Absolute: 0 10*3/uL (ref 0.0–0.1)
Basophils Relative: 0 %
Eosinophils Absolute: 0 10*3/uL (ref 0.0–0.5)
Eosinophils Relative: 0 %
HCT: 38.6 % — ABNORMAL LOW (ref 39.0–52.0)
Hemoglobin: 12.8 g/dL — ABNORMAL LOW (ref 13.0–17.0)
Immature Granulocytes: 1 %
Lymphocytes Relative: 2 %
Lymphs Abs: 0.3 10*3/uL — ABNORMAL LOW (ref 0.7–4.0)
MCH: 30.8 pg (ref 26.0–34.0)
MCHC: 33.2 g/dL (ref 30.0–36.0)
MCV: 93 fL (ref 80.0–100.0)
Monocytes Absolute: 0.9 10*3/uL (ref 0.1–1.0)
Monocytes Relative: 8 %
Neutro Abs: 9.7 10*3/uL — ABNORMAL HIGH (ref 1.7–7.7)
Neutrophils Relative %: 89 %
Platelets: 186 10*3/uL (ref 150–400)
RBC: 4.15 MIL/uL — ABNORMAL LOW (ref 4.22–5.81)
RDW: 14.2 % (ref 11.5–15.5)
WBC: 11 10*3/uL — ABNORMAL HIGH (ref 4.0–10.5)
nRBC: 0 % (ref 0.0–0.2)

## 2022-12-08 LAB — URINALYSIS, ROUTINE W REFLEX MICROSCOPIC
Bacteria, UA: NONE SEEN
Bilirubin Urine: NEGATIVE
Glucose, UA: >1000 mg/dL — AB
Hgb urine dipstick: NEGATIVE
Ketones, ur: >80 mg/dL — AB
Leukocytes,Ua: NEGATIVE
Nitrite: NEGATIVE
Protein, ur: 30 mg/dL — AB
Specific Gravity, Urine: >1.046 — ABNORMAL HIGH (ref 1.005–1.030)
pH: 5.5 (ref 5.0–8.0)

## 2022-12-08 LAB — COMPREHENSIVE METABOLIC PANEL
ALT: 25 U/L (ref 0–44)
AST: 19 U/L (ref 15–41)
Albumin: 3.3 g/dL — ABNORMAL LOW (ref 3.5–5.0)
Alkaline Phosphatase: 58 U/L (ref 38–126)
Anion gap: 9 (ref 5–15)
BUN: 11 mg/dL (ref 8–23)
CO2: 22 mmol/L (ref 22–32)
Calcium: 8.8 mg/dL — ABNORMAL LOW (ref 8.9–10.3)
Chloride: 104 mmol/L (ref 98–111)
Creatinine, Ser: 0.83 mg/dL (ref 0.61–1.24)
GFR, Estimated: >60 mL/min (ref >60)
Glucose, Bld: 263 mg/dL — ABNORMAL HIGH (ref 70–99)
Potassium: 4 mmol/L (ref 3.5–5.1)
Sodium: 135 mmol/L (ref 135–145)
Total Bilirubin: 0.6 mg/dL (ref 0.3–1.2)
Total Protein: 5.6 g/dL — ABNORMAL LOW (ref 6.5–8.1)

## 2022-12-08 LAB — LIPASE, BLOOD: Lipase: 605 U/L — ABNORMAL HIGH (ref 11–51)

## 2022-12-08 MED ORDER — MORPHINE SULFATE (PF) 2 MG/ML IV SOLN
2.0000 mg | Freq: Once | INTRAVENOUS | Status: AC
  Administered 2022-12-08: 2 mg via INTRAVENOUS
  Filled 2022-12-08: qty 1

## 2022-12-08 MED ORDER — OXYCODONE HCL 5 MG PO TABS
5.0000 mg | ORAL_TABLET | ORAL | 0 refills | Status: DC | PRN
  Filled 2022-12-08: qty 15, 3d supply, fill #0

## 2022-12-08 MED ORDER — ONDANSETRON HCL 4 MG/2ML IJ SOLN
4.0000 mg | Freq: Once | INTRAMUSCULAR | Status: AC
  Administered 2022-12-08: 4 mg via INTRAVENOUS
  Filled 2022-12-08: qty 2

## 2022-12-08 MED ORDER — ONDANSETRON 4 MG PO TBDP
4.0000 mg | ORAL_TABLET | Freq: Three times a day (TID) | ORAL | 0 refills | Status: DC | PRN
  Filled 2022-12-08: qty 12, 4d supply, fill #0

## 2022-12-08 MED ORDER — IOHEXOL 300 MG/ML  SOLN
100.0000 mL | Freq: Once | INTRAMUSCULAR | Status: AC | PRN
  Administered 2022-12-08: 100 mL via INTRAVENOUS

## 2022-12-08 NOTE — Discharge Instructions (Signed)
You were seen for pancreatitis in the emergency department.   Start with a clear liquid diet (coffee, tea, sports drinks, broths, etc.) and advance as tolerated to solid foods after 1-2 days.  Take Tylenol for your pain and Zofran for your nausea. You may also take the oxycodone we have prescribed you for any breakthrough pain that may have.  Do not take this before driving or operating heavy machinery.  Do not take this medication with alcohol.  Check your MyChart online for the results of any tests that had not resulted by the time you left the emergency department.   Follow-up with your primary doctor in 2-3 days regarding your visit.  Follow-up with the gastroenterologist to discuss your symptoms.   Return immediately to the emergency department if you experience any of the following: severe abdominal pain, high fevers, or any other concerning symptoms.    Thank you for visiting our Emergency Department. It was a pleasure taking care of you today.

## 2022-12-08 NOTE — ED Provider Notes (Signed)
[place] EMERGENCY DEPARTMENT AT [facility] Provider Note   CSN: [id number] Arrival date & time: 12/08/22  3716     History  Chief Complaint  Patient presents with   Abdominal Pain    Andrew Tanner is a 72 y.o. male.  72 year old male with history of anxiety, DM2, diabetic neuropathy, hypertension, hyperlipidemia, and suspected chronic pancreatitis who presents to the emergency department with abdominal pain.  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  Several hours after the procedure started developing lower abdominal pain.  Describes it as a dull sensation.  Worsened by eating food.  Currently 5/10 in severity.  No radiation.  No fever, flank pain, nausea or vomiting, diarrhea, or constipation.  Does have some abdominal distention.  Has had a right inguinal hernia surgery.       Home Medications Prior to Admission medications   Medication Sig Start Date End Date Taking? Authorizing Provider  ondansetron (ZOFRAN-ODT) 4 MG disintegrating tablet Take 1 tablet (4 mg total) by mouth every 8 (eight) hours as needed for nausea or vomiting. 12/08/22  Yes Rondel Baton, MD  oxyCODONE (ROXICODONE) 5 MG immediate release tablet Take 1 tablet (5 mg total) by mouth every 4 (four) hours as needed for severe pain. 12/08/22  Yes Rondel Baton, MD  alfuzosin (UROXATRAL) 10 MG 24 hr tablet Take 1 tablet (10 mg total) by mouth daily with breakfast. 07/25/22   Jeani Sow, MD  ALPHA LIPOIC ACID PO Take 1,000 mg by mouth in the morning.    [provider]  ciprofloxacin (CIPRO) 500 MG tablet Take 1 tablet (500 mg total) by mouth 2 (two) times daily for 3 days. 12/05/22 12/08/22  Mansouraty, Netty Starring., MD  clonazePAM (KLONOPIN) 0.5 MG tablet Take 0.5 mg by mouth 3 (three) times daily as needed.    [provider]  dicyclomine (BENTYL) 10 MG capsule Take 1 capsule (10 mg total) by  mouth 4 (four) times daily -  before meals and at bedtime. 12/07/22   Mansouraty, Netty Starring., MD  empagliflozin (JARDIANCE) 25 MG TABS tablet Take 1 tablet (25 mg total) by mouth daily before breakfast. 04/25/22   Jeani Sow, MD  FLUoxetine (PROZAC) 20 MG capsule Take 60 mg by mouth every morning. 12/19/21   [provider]  glipiZIDE (GLUCOTROL) 10 MG tablet TAKE 2 TABLETS BY MOUTH TWICE DAILY BEFORE A MEAL 04/25/22   Jeani Sow, MD  glucose blood test strip 1 each by Other route daily. Use as instructed checking once per day, or if new symptoms. 05/18/20   Shade Flood, MD  hydrocortisone 2.5 % ointment APPLY TO AFFECTED AREA EVERYDAY AT BEDTIME 08/19/19   Janalyn Harder, MD  hydrOXYzine (ATARAX/VISTARIL) 50 MG tablet Take 50 mg by mouth at bedtime. 12/16/20   [provider]  ibuprofen (ADVIL,MOTRIN) 800 MG tablet Take 1 tablet (800 mg total) by mouth every 8 (eight) hours as needed for pain. 01/09/12   Sherren Mocha, MD  ketoconazole (NIZORAL) 2 % shampoo Apply to scalp and face 2-3 times a week 11/24/22   Terri Piedra, DO  Krill Oil/Astaxanthin 1000 MG CAPS Take 1,000 mg by mouth daily.    [provider]  Magnesium 500 MG TABS Take 500 mg by mouth daily.    [provider]  metFORMIN (GLUCOPHAGE) 1000 MG tablet Take 1 tablet (1,000 mg total) by mouth 2 (two)  times daily with a meal. 07/27/22   Jeani Sow, MD  nystatin-triamcinolone ointment [facility]) Apply to scalp and face twice a day for up to 1 week and stop 11/23/22   Terri Piedra, DO  rosuvastatin (CRESTOR) 20 MG tablet Take 1 tablet (20 mg total) by mouth daily. 04/25/22   Jeani Sow, MD  Thiamine HCl (VITAMIN B-1) 250 MG tablet Take 250 mg by mouth daily.    [provider]  UNABLE TO FIND Med Name: OUTBACK NEUROPATHY CREAM AND WIPES    [provider]      Allergies    Patient has no known allergies.    Review of Systems   Review of Systems  Physical  Exam Updated Vital Signs BP 131/75 (BP Location: Right Arm)   Pulse (!) 107   Temp 98.3 F (36.8 C)   Resp 16   SpO2 98%  Physical Exam Vitals and nursing note reviewed.  Constitutional:      General: He is not in acute distress.    Appearance: He is well-developed.  HENT:     Head: Normocephalic and atraumatic.     Right Ear: External ear normal.     Left Ear: External ear normal.     Nose: Nose normal.  Eyes:     Extraocular Movements: Extraocular movements intact.     Conjunctiva/sclera: Conjunctivae normal.     Pupils: Pupils are equal, round, and reactive to light.  Cardiovascular:     Rate and Rhythm: Normal rate and regular rhythm.  Pulmonary:     Effort: Pulmonary effort is normal. No respiratory distress.  Abdominal:     General: There is no distension.     Palpations: Abdomen is soft. There is no mass.     Tenderness: There is abdominal tenderness (Epigastric, periumbilical, and suprapubic). There is no guarding.  Musculoskeletal:     Cervical back: Normal range of motion and neck supple.  Skin:    General: Skin is warm and dry.  Neurological:     Mental Status: He is alert. Mental status is at baseline.  Psychiatric:        Mood and Affect: Mood normal.        Behavior: Behavior normal.     ED Results / Procedures / Treatments   Labs (all labs ordered are listed, but only abnormal results are displayed) Labs Reviewed  COMPREHENSIVE METABOLIC PANEL - Abnormal; Notable for the following components:      Result Value   Glucose, Bld 263 (*)    Calcium 8.8 (*)    Total Protein 5.6 (*)    Albumin 3.3 (*)    All other components within normal limits  LIPASE, BLOOD - Abnormal; Notable for the following components:   Lipase 605 (*)    All other components within normal limits  CBC WITH DIFFERENTIAL/PLATELET - Abnormal; Notable for the following components:   WBC 11.0 (*)    RBC 4.15 (*)    Hemoglobin 12.8 (*)    HCT 38.6 (*)    Neutro Abs 9.7 (*)     Lymphs Abs 0.3 (*)    All other components within normal limits  URINALYSIS, ROUTINE W REFLEX MICROSCOPIC - Abnormal; Notable for the following components:   Specific Gravity, Urine >1.046 (*)    Glucose, UA >1,000 (*)    Ketones, ur >80 (*)    Protein, ur 30 (*)    All other components within normal limits    EKG None  Radiology CT ABDOMEN PELVIS W CONTRAST  Result Date: 12/08/2022 CLINICAL DATA:  Abdominal pain after endoscopy. EXAM: CT ABDOMEN AND PELVIS WITH CONTRAST TECHNIQUE: Multidetector CT imaging of the abdomen and pelvis was performed using the standard protocol following bolus administration of intravenous contrast. RADIATION DOSE REDUCTION: This exam was performed according to the departmental dose-optimization program which includes automated exposure control, adjustment of the mA and/or kV according to patient size and/or use of iterative reconstruction technique. CONTRAST:  OMNIPAQUE IOHEXOL 300 MG/ML  SOLN COMPARISON:  11/09/2022 FINDINGS: Lower chest: No acute abnormality Hepatobiliary: 8 mm hypodensity in the left hepatic lobe is stable, difficult to characterize due to its small size but most likely cyst. Gallbladder unremarkable. No biliary ductal dilatation. Pancreas: Again noted are numerous hypodensities throughout the pancreas, with largest in the pancreatic tail measuring 1.6 cm, stable. Ductal dilatation and calcifications noted. Inflammation noted surrounding the pancreatic body, predominantly anterior to the body. This could reflect acute pancreatitis and/or biopsy changes. Spleen: No focal abnormality.  Normal size. Adrenals/Urinary Tract: Bilateral renal cysts measuring up to 5.3 cm in the right midpole, unchanged. These appear simple. No follow-up imaging recommended. No stones or hydronephrosis. Adrenal glands and urinary bladder unremarkable. Stomach/Bowel: Few scattered colonic diverticula. No active diverticulitis. Normal appendix. Stomach and small bowel  decompressed, unremarkable. No bowel obstruction. Vascular/Lymphatic: Aortic atherosclerosis. No evidence of aneurysm or adenopathy. Reproductive: Prostate enlargement. Other: No free fluid or free air. Musculoskeletal: No acute bony abnormality. IMPRESSION: Inflammatory stranding noted around the pancreatic body, predominantly anterior between the pancreas and stomach. This could reflect changes of acute pancreatitis or biopsy changes. Continued ductal dilatation and calcifications throughout the pancreas. Multiple hypoattenuating lesions within the pancreas are unchanged. Aortic atherosclerosis. Scattered colonic diverticulosis. Prostate enlargement. Electronically Signed   By: Charlett Nose M.D.   On: 12/08/2022 10:19    Procedures Procedures    Medications Ordered in ED Medications  morphine (PF) 2 MG/ML injection 2 mg (2 mg Intravenous Given 12/08/22 0858)  ondansetron (ZOFRAN) injection 4 mg (4 mg Intravenous Given 12/08/22 0858)  iohexol (OMNIPAQUE) 300 MG/ML solution 100 mL (100 mLs Intravenous Contrast Given 12/08/22 0955)    ED Course/ Medical Decision Making/ A&P Clinical Course as of 12/08/22 1800  Thu Dec 08, 2022  1043 Doug Sou PA from GI consulted.  Recommends treating the patient for pancreatitis and may be amenable to outpatient follow-up if he is able to tolerate p.o. and his pain is under control. [RP]    Clinical Course User Index [RP] Rondel Baton, MD                                 Medical Decision Making Amount and/or Complexity of Data Reviewed Labs: ordered. Radiology: ordered.  Risk Prescription drug management.   Andrew Tanner is a 72 y.o. male with comorbidities that complicate the patient evaluation including anxiety, DM2, diabetic neuropathy, hypertension, hyperlipidemia, and suspected chronic pancreatitis who presents to the emergency department with abdominal pain.   Initial Ddx:  EGD induced pancreatitis, idiopathic pancreatitis,  pancreatic mass, procedural complication, bowel injury, ileus  MDM/Course:  Patient presents to the emergency department with abdominal pain after EGD.  Scribes his abdominal pain is in his lower abdomen but does have some tenderness to palpation in the epigastrium on this exam.  No significant vomiting.  Had labs that showed an elevated lipase at 605.  CT scan did show evidence of stranding concerning  for pancreatitis.  I suspect this is likely due to his recent biopsy which did show that he had a noncancerous growth of his pancreas.  Upon re-evaluation patient was feeling better after 2 mg of morphine and Zofran.  Was able to tolerate p.o.  Consulted GI who recommended starting the patient on a clear liquid diet and advancing as tolerated over the next few days and following up as an outpatient.  Patient was tachycardic upon initial evaluation but heart rate had improved to normal limits when I had evaluated him a second time.  Was given outpatient prescription of Zofran as well as oxycodone to take for any pain or nausea and vomiting.  This patient presents to the ED for concern of complaints listed in HPI, this involves an extensive number of treatment options, and is a complaint that carries with it a high risk of complications and morbidity. Disposition including potential need for admission considered.   Dispo: DC Home. Return precautions discussed including, but not limited to, those listed in the AVS. Allowed pt time to ask questions which were answered fully prior to dc.  Additional history obtained from spouse Records reviewed Outpatient Clinic Notes and procedure note The following labs were independently interpreted: Chemistry and show  hyperglycemia without DKA I independently reviewed the following imaging with scope of interpretation limited to determining acute life threatening conditions related to emergency care: CT Abdomen/Pelvis and agree with the radiologist interpretation with the  following exceptions: none I personally reviewed and interpreted cardiac monitoring: normal sinus rhythm  I personally reviewed and interpreted the pt's EKG: see above for interpretation  I have reviewed the patients home medications and made adjustments as needed Consults: Gastroenterology Social Determinants of health:  Elderly         Final Clinical Impression(s) / ED Diagnoses Final diagnoses:  Other acute pancreatitis without infection or necrosis  Abdominal pain, unspecified abdominal location    Rx / DC Orders ED Discharge Orders          Ordered    ondansetron (ZOFRAN-ODT) 4 MG disintegrating tablet  Every 8 hours PRN        12/08/22 1046    oxyCODONE (ROXICODONE) 5 MG immediate release tablet  Every 4 hours PRN        12/08/22 1046              Rondel Baton, MD 12/08/22 1801

## 2022-12-08 NOTE — ED Triage Notes (Signed)
Pt has ongoing issues with pancreas, he is being worked up, pt had endoscopy Monday ,obtained MANY samples. The next day,pt noted his lower right abdomen with some pain/lots of burping. Called his MD ,ordered antispasmodic, but pt hasn't seen it on his profile. Today pain is intermittent, dull.stabbing sometimes, has had a bm yesterday. No fevers.

## 2022-12-09 ENCOUNTER — Encounter: Payer: Self-pay | Admitting: Gastroenterology

## 2022-12-10 ENCOUNTER — Encounter: Payer: Self-pay | Admitting: Family Medicine

## 2022-12-12 ENCOUNTER — Encounter: Payer: Self-pay | Admitting: Family Medicine

## 2022-12-13 ENCOUNTER — Telehealth: Payer: Self-pay | Admitting: Gastroenterology

## 2022-12-13 ENCOUNTER — Ambulatory Visit: Payer: Medicare Other

## 2022-12-13 DIAGNOSIS — K862 Cyst of pancreas: Secondary | ICD-10-CM

## 2022-12-13 DIAGNOSIS — K8689 Other specified diseases of pancreas: Secondary | ICD-10-CM | POA: Diagnosis not present

## 2022-12-13 NOTE — Telephone Encounter (Signed)
Records faxed again to [facility] Original referral and records were faxed on 12/07/22- I have also attached that confirmation.

## 2022-12-13 NOTE — Telephone Encounter (Signed)
Inbound call from [facility] stating there were suppose to receive a referral for patient. Requesting referral be sent to [phone]. Please advise, thank you.

## 2022-12-14 NOTE — Telephone Encounter (Signed)
Dr. Meridee Score, [place] Childes, not sure if you were aware patient went to the ED 3 days s/p EUS. Lipase level was 605. See patient's msg below.

## 2022-12-14 NOTE — Telephone Encounter (Signed)
Pt chart was reviewed and notified of the following documentation: Pt was also notified that his medication has been sent to his pharmacy. Pt verbalized understanding with all questions answered.    Loretha Stapler, RN      12/13/22  4:21 PM Note Records faxed again to [facility] Original referral and records were faxed on 12/07/22- I have also attached that confirmation.        VD   12/13/22  4:06 PM Jesus Genera routed this conversation to Loretha Stapler, RN    Jesus Genera   VD   12/13/22  4:06 PM Note Inbound call from [facility] stating there were suppose to receive a referral for patient. Requesting referral be sent to [phone]. Please advise, thank you.

## 2022-12-15 NOTE — Telephone Encounter (Signed)
[facility], I was not aware that patient had had acute pancreatitis post procedure. Patient seems to be doing better based on his report. I will have [name] follow-up with him tomorrow. He can certainly try to advance his diet further as able. He should get some laboratories checked as well and will have [name] work on that as well.  [name], Please have patient come in for CBC/CMP/amylase/lipase as able. Please find out how he has been doing in regards to his pain from his pancreatitis. He can advance his diet as able and as he tolerates and should be heart healthy and low-fat. Thanks. GM

## 2022-12-16 NOTE — Addendum Note (Signed)
Addended by: Loretha Stapler on: 12/16/2022 09:30 AM   Modules accepted: Orders

## 2022-12-18 ENCOUNTER — Encounter: Payer: Self-pay | Admitting: Gastroenterology

## 2022-12-18 NOTE — Progress Notes (Signed)
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 Further analysis is being performed. This is likely something that will be able to be monitored but it seems to be a pre-malignant lesion, but the overall risk of these branch-duct IPMNs is less than a Main duct IPMN, but overall still something that needs monitoring. This will need to be kept in mind, as decisions are made in regards to the patient's duodenal resection for his duodenal circumferential lesion. When the full analysis returns for [facility] testing he will be updated further. This information will be scanned into chart and patient will get the results in mail for records.  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  It will need monitoring/surveillance but may not need resection. His duodenal lesion however, certainly needs resection. This information will be shared with surgeons (not sure if he will be seeing Donell Beers or Freida Busman as of yet) and they can also help with finalizing plan for surveillance vs treatment of the pancreas cyst. Tentative MRI/MRCP in 58-months. Thanks. GM

## 2022-12-19 ENCOUNTER — Other Ambulatory Visit (INDEPENDENT_AMBULATORY_CARE_PROVIDER_SITE_OTHER): Payer: Medicare Other

## 2022-12-19 DIAGNOSIS — K859 Acute pancreatitis without necrosis or infection, unspecified: Secondary | ICD-10-CM

## 2022-12-19 LAB — COMPREHENSIVE METABOLIC PANEL
ALT: 31 U/L (ref 0–53)
AST: 26 U/L (ref 0–37)
Albumin: 3.4 g/dL — ABNORMAL LOW (ref 3.5–5.2)
Alkaline Phosphatase: 77 U/L (ref 39–117)
BUN: 15 mg/dL (ref 6–23)
CO2: 25 meq/L (ref 19–32)
Calcium: 9.6 mg/dL (ref 8.4–10.5)
Chloride: 104 meq/L (ref 96–112)
Creatinine, Ser: 0.92 mg/dL (ref 0.40–1.50)
GFR: 82.55 mL/min (ref >60.00)
Glucose, Bld: 218 mg/dL — ABNORMAL HIGH (ref 70–99)
Potassium: 4.3 meq/L (ref 3.5–5.1)
Sodium: 138 meq/L (ref 135–145)
Total Bilirubin: 0.3 mg/dL (ref 0.2–1.2)
Total Protein: 6.1 g/dL (ref 6.0–8.3)

## 2022-12-19 LAB — CBC WITH DIFFERENTIAL/PLATELET
Basophils Absolute: 0.1 10*3/uL (ref 0.0–0.1)
Basophils Relative: 0.8 % (ref 0.0–3.0)
Eosinophils Absolute: 0.3 10*3/uL (ref 0.0–0.7)
Eosinophils Relative: 3.9 % (ref 0.0–5.0)
HCT: 42 % (ref 39.0–52.0)
Hemoglobin: 13.8 g/dL (ref 13.0–17.0)
Lymphocytes Relative: 11.4 % — ABNORMAL LOW (ref 12.0–46.0)
Lymphs Abs: 1 10*3/uL (ref 0.7–4.0)
MCHC: 32.9 g/dL (ref 30.0–36.0)
MCV: 90.7 fl (ref 78.0–100.0)
Monocytes Absolute: 0.7 10*3/uL (ref 0.1–1.0)
Monocytes Relative: 7.7 % (ref 3.0–12.0)
Neutro Abs: 6.5 10*3/uL (ref 1.4–7.7)
Neutrophils Relative %: 76.2 % (ref 43.0–77.0)
Platelets: 366 10*3/uL (ref 150.0–400.0)
RBC: 4.63 Mil/uL (ref 4.22–5.81)
RDW: 14.9 % (ref 11.5–15.5)
WBC: 8.6 10*3/uL (ref 4.0–10.5)

## 2022-12-19 LAB — LIPASE: Lipase: 106 U/L — ABNORMAL HIGH (ref 11.0–59.0)

## 2022-12-19 LAB — AMYLASE: Amylase: 45 U/L (ref 27–131)

## 2022-12-19 NOTE — Progress Notes (Signed)
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  It will need monitoring/surveillance but may not need resection. His duodenal lesion however, certainly needs resection. This information will be shared with surgeons (not sure if he will be seeing Donell Beers or Freida Busman as of yet) and they Andrew also help with finalizing plan for surveillance vs treatment of the pancreas cyst. Tentative MRI/MRCP in 27-months. Thanks. GM

## 2022-12-19 NOTE — Progress Notes (Signed)
The pt has been advised of the results and will see [facility] (appt already made) MRI MRCP to be repeated in 6 months.  Message to call pt at that time.

## 2022-12-21 ENCOUNTER — Telehealth: Payer: Self-pay

## 2022-12-21 LAB — PANCREATIC ELASTASE, FECAL: Pancreatic Elastase-1, Stool: 117 ug/g — ABNORMAL LOW

## 2022-12-21 NOTE — Telephone Encounter (Signed)
Transition Care Management Unsuccessful Follow-up Telephone Call  Date of discharge and from where:  12/08/2022 [facility]  Attempts:  2nd Attempt  Reason for unsuccessful TCM follow-up call:  Left voice message  Shamarie Call [facility]  [facility], [facility] Guide Direct Dial: [phone]  Website: [email or web address]

## 2022-12-21 NOTE — Telephone Encounter (Signed)
Transition Care Management Unsuccessful Follow-up Telephone Call  Date of discharge and from where:  12/08/2022 [facility]  Attempts:  1st Attempt  Reason for unsuccessful TCM follow-up call:  Left voice message  Gideon Burstein [facility]  [facility], [facility] Guide Direct Dial: [phone]  Website: [email or web address]

## 2022-12-24 NOTE — Telephone Encounter (Signed)
Dr. Meridee Score, pls review patient note below. As previously noted, patient was seen in the ED for acute pancreatitis a few days s/p EUS. Pls review fecal calprotectin result and verify if you want patient to repeat fecal calprotectin test or start Creon or Zenpep at this time. THX.

## 2022-12-26 ENCOUNTER — Other Ambulatory Visit: Payer: Self-pay

## 2022-12-26 DIAGNOSIS — K860 Alcohol-induced chronic pancreatitis: Secondary | ICD-10-CM | POA: Diagnosis not present

## 2022-12-26 DIAGNOSIS — K861 Other chronic pancreatitis: Secondary | ICD-10-CM | POA: Diagnosis not present

## 2022-12-26 DIAGNOSIS — K859 Acute pancreatitis without necrosis or infection, unspecified: Secondary | ICD-10-CM

## 2022-12-26 DIAGNOSIS — K862 Cyst of pancreas: Secondary | ICD-10-CM | POA: Diagnosis not present

## 2022-12-26 NOTE — Progress Notes (Signed)
Final molecular testing form [facility] suggests that this lesion from the pancreas has a 97% probability of benign disease over the next 3-years on analysis. Will have this scanned into the chart and sent to patient for his records. I'll forward to Dr. Donell Beers as well whom he is seeing soon.

## 2022-12-26 NOTE — Telephone Encounter (Signed)
Viviann Spare, pls contact patient and if he is feeling better and eating more solid foods, pls send him to our lab to repeat the fecal elastase stool test, if repeat test still low, will start on Zenpep or Creon. THX.

## 2022-12-26 NOTE — Telephone Encounter (Signed)
Yes it is possible that if we were not eating enough fat that fecal elastase could be low. If he is concerned about the findings, then he can give 1 more fecal elastase and if it still remains low then initiate Creon or Zenpep. Reasonable thought process. Thanks. GM

## 2022-12-26 NOTE — Telephone Encounter (Signed)
Pt made aware Dr. Meridee Score and Alcide Evener NP recommendations: Order was placed in Epic.  Pt made aware. Pt verbalized understanding with all questions answered.

## 2022-12-27 DIAGNOSIS — F3341 Major depressive disorder, recurrent, in partial remission: Secondary | ICD-10-CM | POA: Diagnosis not present

## 2023-01-02 ENCOUNTER — Other Ambulatory Visit: Payer: Medicare Other

## 2023-01-03 ENCOUNTER — Ambulatory Visit (INDEPENDENT_AMBULATORY_CARE_PROVIDER_SITE_OTHER): Payer: Medicare Other

## 2023-01-03 ENCOUNTER — Other Ambulatory Visit: Payer: Medicare Other

## 2023-01-03 VITALS — Wt 183.0 lb

## 2023-01-03 DIAGNOSIS — K859 Acute pancreatitis without necrosis or infection, unspecified: Secondary | ICD-10-CM

## 2023-01-03 DIAGNOSIS — Z0000 Encounter for general adult medical examination without abnormal findings: Secondary | ICD-10-CM | POA: Diagnosis not present

## 2023-01-03 NOTE — Progress Notes (Signed)
Subjective:   Andrew Tanner is a 72 y.o. male who presents for Medicare Annual/Subsequent preventive examination.  Visit Complete: Virtual  I connected with  Andrew Tanner on 01/03/23 by a audio enabled telemedicine application and verified that I am speaking with the correct person using two identifiers.  Patient Location: Home  Provider Location: Home Office  I discussed the limitations of evaluation and management by telemedicine. The patient expressed understanding and agreed to proceed.  Vital Signs: Unable to obtain new vitals due to this being a telehealth visit.  Cardiac Risk Factors include: advanced age (>58men, >22 women);male gender;diabetes mellitus;hypertension;dyslipidemia     Objective:    Today's Vitals   01/03/23 1349  Weight: 183 lb (83 kg)   Body mass index is 25.52 kg/m.     01/03/2023    1:52 PM 12/08/2022    7:40 AM 12/05/2022   10:06 AM 11/09/2022    8:38 AM 12/17/2021    8:14 AM 10/19/2020    9:09 AM 01/17/2017    9:52 AM  Advanced Directives  Does Patient Have a Medical Advance Directive? Yes No No Yes Yes Yes No  Type of Estate agent of [place];Living will    Healthcare Power of [place];Living will Healthcare Power of [place];Living will   Copy of Healthcare Power of Attorney in Chart? No - copy requested    No - copy requested No - copy requested   Would patient like information on creating a medical advance directive?  No - Patient declined No - Patient declined    Yes (ED - Information included in AVS)    Current Medications (verified) Outpatient Encounter Medications as of 01/03/2023  Medication Sig   alfuzosin (UROXATRAL) 10 MG 24 hr tablet Take 1 tablet (10 mg total) by mouth daily with breakfast.   ALPHA LIPOIC ACID PO Take 1,000 mg by mouth in the morning.   clonazePAM (KLONOPIN) 0.5 MG tablet Take 0.5 mg by mouth 3 (three) times daily as needed.   dicyclomine (BENTYL) 10 MG capsule Take 1 capsule (10 mg  total) by mouth 4 (four) times daily -  before meals and at bedtime.   empagliflozin (JARDIANCE) 25 MG TABS tablet Take 1 tablet (25 mg total) by mouth daily before breakfast.   FLUoxetine (PROZAC) 20 MG capsule Take 60 mg by mouth every morning.   glipiZIDE (GLUCOTROL) 10 MG tablet TAKE 2 TABLETS BY MOUTH TWICE DAILY BEFORE A MEAL   glucose blood test strip 1 each by Other route daily. Use as instructed checking once per day, or if new symptoms.   hydrocortisone 2.5 % ointment APPLY TO AFFECTED AREA EVERYDAY AT BEDTIME   hydrOXYzine (ATARAX/VISTARIL) 50 MG tablet Take 50 mg by mouth at bedtime.   ibuprofen (ADVIL,MOTRIN) 800 MG tablet Take 1 tablet (800 mg total) by mouth every 8 (eight) hours as needed for pain.   ketoconazole (NIZORAL) 2 % shampoo Apply to scalp and face 2-3 times a week   Krill Oil/Astaxanthin 1000 MG CAPS Take 1,000 mg by mouth daily.   Magnesium 500 MG TABS Take 500 mg by mouth daily.   metFORMIN (GLUCOPHAGE) 1000 MG tablet Take 1 tablet (1,000 mg total) by mouth 2 (two) times daily with a meal.   nystatin-triamcinolone ointment (MYCOLOG) Apply to scalp and face twice a day for up to 1 week and stop   ondansetron (ZOFRAN-ODT) 4 MG disintegrating tablet Take 1 tablet (4 mg total) by mouth every 8 (eight) hours as needed for nausea or  vomiting.   oxyCODONE (ROXICODONE) 5 MG immediate release tablet Take 1 tablet (5 mg total) by mouth every 4 (four) hours as needed for severe pain.   rosuvastatin (CRESTOR) 20 MG tablet Take 1 tablet (20 mg total) by mouth daily.   Thiamine HCl (VITAMIN B-1) 250 MG tablet Take 250 mg by mouth daily.   UNABLE TO FIND Med Name: OUTBACK NEUROPATHY CREAM AND WIPES   No facility-administered encounter medications on file as of 01/03/2023.    Allergies (verified) Patient has no known allergies.   History: Past Medical History:  Diagnosis Date   Anxiety    Basal cell carcinoma    Cancer (HCC)    basal cell skin CA   Cataract    Depression     Diabetes mellitus without complication (HCC)    Diabetic neuropathy (HCC)    Hyperlipidemia    Hypertension    IC (interstitial cystitis)    Neuromuscular disorder (HCC) 01/09/21   Peripheral  Neuropathy   Squamous cell carcinoma of skin    Past Surgical History:  Procedure Laterality Date   BIOPSY  12/05/2022   Procedure: BIOPSY;  Surgeon: [name], MD;  Location: [facility];  Service: Gastroenterology;;   CATARACT EXTRACTION, BILATERAL     ESOPHAGOGASTRODUODENOSCOPY N/A 12/05/2022   Procedure: ESOPHAGOGASTRODUODENOSCOPY (EGD);  Surgeon: [name], MD;  Location: [facility];  Service: Gastroenterology;  Laterality: N/A;   EUS N/A 12/05/2022   Procedure: UPPER ENDOSCOPIC ULTRASOUND (EUS) RADIAL;  Surgeon: [name], MD;  Location: [facility];  Service: Gastroenterology;  Laterality: N/A;   FINE NEEDLE ASPIRATION N/A 12/05/2022   Procedure: FINE NEEDLE ASPIRATION (FNA) LINEAR;  Surgeon: [name], MD;  Location: [facility];  Service: Gastroenterology;  Laterality: N/A;   HERNIA REPAIR N/A    inguinal   NECK SURGERY     ant/post   SPINE SURGERY     SUBMUCOSAL TATTOO INJECTION  12/05/2022   Procedure: SUBMUCOSAL TATTOO INJECTION;  Surgeon: [name], MD;  Location: [facility];  Service: Gastroenterology;;   TRANSURETHRAL RESECTION OF PROSTATE     TURP VAPORIZATION     Family History  Problem Relation Age of Onset   Lung cancer Mother    Heart disease Father    Hyperlipidemia Father    Diabetes Father        type 1   Diabetes Brother    Depression Brother    Renal cancer Brother    Anxiety disorder Brother    Stomach cancer Neg Hx    Colon cancer Neg Hx    Pancreatic cancer Neg Hx    Esophageal cancer Neg Hx    Rectal cancer Neg Hx    Social History   Socioeconomic History   Marital status: Married    Spouse name: Marilynne Drivers   Number of children: 1   Years of education: Not on file    Highest education level: Bachelor's degree (e.g., BA, AB, BS)  Occupational History   Not on file  Tobacco Use   Smoking status: Former    Current packs/day: 0.00    Average packs/day: 1 pack/day for 5.0 years (5.0 ttl pk-yrs)    Types: Cigarettes    Start date: 03/02/1977    Quit date: 03/02/1982    Years since quitting: 40.8   Smokeless tobacco: Never   Tobacco comments:    quit 28 yrs ago  Vaping Use   Vaping status: Never Used  Substance and Sexual Activity   Alcohol  use: Not Currently    Comment: none   Drug use: No   Sexual activity: Not Currently    Birth control/protection: Abstinence  Other Topics Concern   Not on file  Social History Narrative   Retired Risk analyst, 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   Social Determinants of Health   Financial Resource Strain: Low Risk  (01/03/2023)   Overall Financial Resource Strain (CARDIA)    Difficulty of Paying Living Expenses: Not hard at all  Food Insecurity: No Food Insecurity (01/03/2023)   Hunger Vital Sign    Worried About Running Out of Food in the Last Year: Never true    Ran Out of Food in the Last Year: Never true  Transportation Needs: No Transportation Needs (01/03/2023)   PRAPARE - Administrator, Civil Service (Medical): No    Lack of Transportation (Non-Medical): No  Physical Activity: Sufficiently Active (01/03/2023)   Exercise Vital Sign    Days of Exercise per Week: 6 days    Minutes of Exercise per Session: 30 min  Stress: No Stress Concern Present (01/03/2023)   [employer] of Occupational Health - Occupational Stress Questionnaire    Feeling of Stress : Not at all  Social Connections: Moderately Integrated (01/03/2023)   Social Connection and Isolation Panel [NHANES]    Frequency of Communication with Friends and Family: More than three times a week    Frequency of Social Gatherings with Friends and Family: More than three times a week    Attends Religious Services: More than 4  times per year    Active Member of [employer] or Organizations: No    Attends Engineer, structural: Never    Marital Status: Married    Tobacco Counseling Counseling given: Not Answered Tobacco comments: quit 28 yrs ago   Clinical Intake:  Pre-visit preparation completed: Yes  Pain : No/denies pain     BMI - recorded: 25.52 Nutritional Status: BMI 25 -29 Overweight Nutritional Risks: None Diabetes: Yes CBG done?: No Did pt. bring in CBG monitor from home?: No  How often do you need to have someone help you when you read instructions, pamphlets, or other written materials from your doctor or pharmacy?: 1 - Never  Interpreter Needed?: No  Information entered by :: Lanier Ensign, LPN   Activities of Daily Living    01/03/2023    1:50 PM  In your present state of health, do you have any difficulty performing the following activities:  Hearing? 0  Vision? 0  Difficulty concentrating or making decisions? 0  Walking or climbing stairs? 0  Dressing or bathing? 0  Doing errands, shopping? 0  Preparing Food and eating ? N  Using the Toilet? N  In the past six months, have you accidently leaked urine? N  Do you have problems with loss of bowel control? N  Managing your Medications? N  Managing your Finances? N  Housekeeping or managing your Housekeeping? N    Patient Care Team: Jeani Sow, MD as PCP - General (Family Medicine) Billie Ruddy, OD as Referring Physician (Optometry)  Indicate any recent Medical Services you may have received from other than [facility] providers in the past year (date may be approximate).     Assessment:   This is a routine wellness examination for Svend.  Hearing/Vision screen Hearing Screening - Comments:: Pt denies any hearing issues  Vision Screening - Comments:: Follows up with my eye dr fr annual eye exams    Goals  Addressed             This Visit's Progress    Patient Stated       Improve exercise routine        Depression Screen    01/03/2023    1:53 PM 11/21/2022    8:24 AM 07/25/2022    8:25 AM 12/17/2021    8:13 AM 10/04/2021   10:04 AM 07/15/2021   11:10 AM 05/10/2021   10:18 AM  PHQ 2/9 Scores  PHQ - 2 Score 0 2 2 2  0 0 0  PHQ- 9 Score 0 3 5 2   0 0    Fall Risk    01/03/2023    1:56 PM 11/21/2022    8:23 AM 07/25/2022    8:25 AM 04/25/2022    8:04 AM 12/17/2021    8:15 AM  Fall Risk   Falls in the past year? 1 1 0 0 1  Number falls in past yr: 1 0 0 0 0  Injury with Fall? 1 1 0 0 0  Comment ER visit      Risk for fall due to : Impaired vision Other (Comment) No Fall Risks No Fall Risks Impaired balance/gait  Risk for fall due to: Comment  pt states his b/p was low     Follow up Falls prevention discussed Falls evaluation completed Falls evaluation completed Falls evaluation completed Falls prevention discussed    MEDICARE RISK AT HOME: Medicare Risk at Home Any stairs in or around the home?: Yes If so, are there any without handrails?: No Home free of loose throw rugs in walkways, pet beds, electrical cords, etc?: Yes Adequate lighting in your home to reduce risk of falls?: Yes Life alert?: No Use of a cane, walker or w/c?: No Grab bars in the bathroom?: Yes Shower chair or bench in shower?: Yes Elevated toilet seat or a handicapped toilet?: No  TIMED UP AND GO:  Was the test performed?  No    Cognitive Function:        01/03/2023    1:57 PM 12/17/2021    8:18 AM  6CIT Screen  What Year? 0 points 0 points  What month? 0 points 0 points  What time? 0 points 0 points  Count back from 20 0 points 0 points  Months in reverse 0 points 0 points  Repeat phrase 0 points 0 points  Total Score 0 points 0 points    Immunizations Immunization History  Administered Date(s) Administered   Fluad Quad(high Dose 65+) 12/31/2018, 01/20/2020, 12/21/2020, 01/25/2022   Influenza Split 01/13/2011   Influenza, High Dose Seasonal PF 01/01/2018   Influenza,inj,Quad PF,6+ Mos 02/21/2013,  03/10/2014, 02/16/2017   Moderna Sars-Covid-2 Vaccination 05/20/2019, 06/17/2019   Pneumococcal Conjugate-13 02/16/2017   Pneumococcal Polysaccharide-23 12/31/2018   Tdap 11/22/2012    TDAP status: Due, Education has been provided regarding the importance of this vaccine. Advised may receive this vaccine at local pharmacy or Health Dept. Aware to provide a copy of the vaccination record if obtained from local pharmacy or Health Dept. Verbalized acceptance and understanding.  Flu Vaccine status: Due, Education has been provided regarding the importance of this vaccine. Advised may receive this vaccine at local pharmacy or Health Dept. Aware to provide a copy of the vaccination record if obtained from local pharmacy or Health Dept. Verbalized acceptance and understanding.  Pneumococcal vaccine status: Up to date  Covid-19 vaccine status: Information provided on how to obtain vaccines.   Qualifies for Shingles Vaccine?  Yes   Zostavax completed No   Shingrix Completed?: No.    Education has been provided regarding the importance of this vaccine. Patient has been advised to call insurance company to determine out of pocket expense if they have not yet received this vaccine. Advised may also receive vaccine at local pharmacy or Health Dept. Verbalized acceptance and understanding.  Screening Tests Health Maintenance  Topic Date Due   Zoster Vaccines- Shingrix (1 of 2) Never done   FOOT EXAM  10/05/2022   DTaP/Tdap/Td (2 - Td or Tdap) 11/23/2022   INFLUENZA VACCINE  07/10/2023 (Originally 11/10/2022)   HEMOGLOBIN A1C  01/24/2023   Fecal DNA (Cologuard)  02/05/2023   Diabetic kidney evaluation - Urine ACR  04/26/2023   OPHTHALMOLOGY EXAM  08/16/2023   Diabetic kidney evaluation - eGFR measurement  12/19/2023   Medicare Annual Wellness (AWV)  01/03/2024   Pneumonia Vaccine 15+ Years old  Completed   Hepatitis C Screening  Completed   HPV VACCINES  Aged Out   Colonoscopy  Discontinued    COVID-19 Vaccine  Discontinued    Health Maintenance  Health Maintenance Due  Topic Date Due   Zoster Vaccines- Shingrix (1 of 2) Never done   FOOT EXAM  10/05/2022   DTaP/Tdap/Td (2 - Td or Tdap) 11/23/2022    Colorectal cancer screening: Type of screening: Colonoscopy. Completed 03/30/20. Repeat every 10 years  Additional Screening:  Hepatitis C Screening: Completed 01/01/18  Vision Screening: Recommended annual ophthalmology exams for early detection of glaucoma and other disorders of the eye. Is the patient up to date with their annual eye exam?  Yes  Who is the provider or what is the name of the office in which the patient attends annual eye exams? [facility]  If pt is not established with a provider, would they like to be referred to a provider to establish care? No .   Dental Screening: Recommended annual dental exams for proper oral hygiene  Diabetic Foot Exam: Diabetic Foot Exam: Overdue, Pt has been advised about the importance in completing this exam. Pt is scheduled for diabetic foot exam on next appt .  Community Resource Referral / Chronic Care Management: CRR required this visit?  No   CCM required this visit?  No     Plan:     I have personally reviewed and noted the following in the patient's chart:   Medical and social history Use of alcohol, tobacco or illicit drugs  Current medications and supplements including opioid prescriptions. Patient is currently taking opioid prescriptions. Information provided to patient regarding non-opioid alternatives. Patient advised to discuss non-opioid treatment plan with their provider. Functional ability and status Nutritional status Physical activity Advanced directives List of other physicians Hospitalizations, surgeries, and ER visits in previous 12 months Vitals Screenings to include cognitive, depression, and falls Referrals and appointments  In addition, I have reviewed and discussed with patient certain  preventive protocols, quality metrics, and best practice recommendations. A written personalized care plan for preventive services as well as general preventive health recommendations were provided to patient.     Marzella Schlein, LPN   [id number]   After Visit Summary: (MyChart) Due to this being a telephonic visit, the after visit summary with patients personalized plan was offered to patient via MyChart   Nurse Notes: none

## 2023-01-03 NOTE — Patient Instructions (Signed)
Andrew Tanner , Thank you for taking time to come for your Medicare Wellness Visit. I appreciate your ongoing commitment to your health goals. Please review the following plan we discussed and let me know if I can assist you in the future.   Referrals/Orders/Follow-Ups/Clinician Recommendations: increase continue exercise   This is a list of the screening recommended for you and due dates:  Health Maintenance  Topic Date Due   Zoster (Shingles) Vaccine (1 of 2) Never done   COVID-19 Vaccine (3 - Moderna risk series) 07/15/2019   Complete foot exam   10/05/2022   Flu Shot  11/10/2022   DTaP/Tdap/Td vaccine (2 - Td or Tdap) 11/23/2022   Hemoglobin A1C  01/24/2023   Cologuard (Stool DNA test)  02/05/2023   Yearly kidney health urinalysis for diabetes  04/26/2023   Eye exam for diabetics  08/16/2023   Yearly kidney function blood test for diabetes  12/19/2023   Medicare Annual Wellness Visit  01/03/2024   Pneumonia Vaccine  Completed   Hepatitis C Screening  Completed   HPV Vaccine  Aged Out   Colon Cancer Screening  Discontinued    Advanced directives: (Declined) Advance directive discussed with you today. Even though you declined this today, please call our office should you change your mind, and we can give you the proper paperwork for you to fill out.  Next Medicare Annual Wellness Visit scheduled for next year: Yes

## 2023-01-06 ENCOUNTER — Other Ambulatory Visit: Payer: Self-pay | Admitting: General Surgery

## 2023-01-06 DIAGNOSIS — Z8719 Personal history of other diseases of the digestive system: Secondary | ICD-10-CM | POA: Diagnosis not present

## 2023-01-06 DIAGNOSIS — N3010 Interstitial cystitis (chronic) without hematuria: Secondary | ICD-10-CM | POA: Diagnosis not present

## 2023-01-06 DIAGNOSIS — D132 Benign neoplasm of duodenum: Secondary | ICD-10-CM

## 2023-01-06 DIAGNOSIS — E119 Type 2 diabetes mellitus without complications: Secondary | ICD-10-CM

## 2023-01-06 DIAGNOSIS — D490 Neoplasm of unspecified behavior of digestive system: Secondary | ICD-10-CM | POA: Diagnosis not present

## 2023-01-06 DIAGNOSIS — E1121 Type 2 diabetes mellitus with diabetic nephropathy: Secondary | ICD-10-CM

## 2023-01-10 DIAGNOSIS — E119 Type 2 diabetes mellitus without complications: Secondary | ICD-10-CM | POA: Diagnosis not present

## 2023-01-10 LAB — PANCREATIC ELASTASE, FECAL: Pancreatic Elastase-1, Stool: 203 ug/g

## 2023-01-11 ENCOUNTER — Other Ambulatory Visit: Payer: Self-pay | Admitting: *Deleted

## 2023-01-11 DIAGNOSIS — E1165 Type 2 diabetes mellitus with hyperglycemia: Secondary | ICD-10-CM

## 2023-01-12 ENCOUNTER — Encounter: Payer: Self-pay | Admitting: Gastroenterology

## 2023-01-16 ENCOUNTER — Ambulatory Visit (INDEPENDENT_AMBULATORY_CARE_PROVIDER_SITE_OTHER): Payer: Medicare Other | Admitting: Family Medicine

## 2023-01-16 ENCOUNTER — Encounter: Payer: Self-pay | Admitting: Family Medicine

## 2023-01-16 VITALS — BP 143/81 | HR 81 | Temp 98.0°F | Resp 18 | Ht 70.0 in | Wt 180.5 lb

## 2023-01-16 DIAGNOSIS — G63 Polyneuropathy in diseases classified elsewhere: Secondary | ICD-10-CM | POA: Diagnosis not present

## 2023-01-16 DIAGNOSIS — Z7984 Long term (current) use of oral hypoglycemic drugs: Secondary | ICD-10-CM

## 2023-01-16 DIAGNOSIS — E1165 Type 2 diabetes mellitus with hyperglycemia: Secondary | ICD-10-CM | POA: Diagnosis not present

## 2023-01-16 DIAGNOSIS — E782 Mixed hyperlipidemia: Secondary | ICD-10-CM

## 2023-01-16 DIAGNOSIS — D490 Neoplasm of unspecified behavior of digestive system: Secondary | ICD-10-CM

## 2023-01-16 MED ORDER — GLUCOSE BLOOD VI STRP: 1.0000 | ORAL_STRIP | Freq: Two times a day (BID) | 1 refills | Status: AC

## 2023-01-16 MED ORDER — PEN NEEDLES 31G X 5 MM MISC: 1.0000 | Freq: Every day | 1 refills | Status: DC

## 2023-01-16 MED ORDER — LANTUS SOLOSTAR 100 UNIT/ML ~~LOC~~ SOPN
25.0000 [IU] | PEN_INJECTOR | Freq: Every day | SUBCUTANEOUS | 99 refills | Status: DC
Start: 2023-01-16 — End: 2023-05-10

## 2023-01-16 NOTE — Assessment & Plan Note (Signed)
Chronic.  Not controlled for long time-hyperglycemia.   Continue jardiance 25mg , glipizide 10mg  bid and metformin 1000mg  bid.  Will continue to work on exercise, etc. will start Lantus 10 units daily.  Increase by 3 units every 3 days to a max of 30.  Keep in touch with sugars.  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  Will refer to endocrinology as he may be on long-term total insulin regimen.  May need continuous glucose monitor and possibly pump.  Will defer all of that to them.  Will get him started on long-acting insulin to try to get his sugars down a bit before the procedure.  He and wife are very motivated now.

## 2023-01-16 NOTE — Patient Instructions (Addendum)
It was very nice to see you today! Cardiology and Endocrine referral.    Lantus 10units and increase by 3 units every 3 days.  Cut the glipizide as sugars decrease.    PLEASE NOTE:  If you had any lab tests please let us know if you have not heard back within a few days. You may see your results on MyChart before we have a chance to review them but we will give you a call once they are reviewed by [place]. If we ordered any referrals today, please let us know if you have not heard from their office within the next week.   Please try these tips to maintain a healthy lifestyle:  Eat most of your calories during the day when you are active. Eliminate processed foods including packaged sweets (pies, cakes, cookies), reduce intake of potatoes, white bread, white pasta, and white rice. Look for whole grain options, oat flour or almond flour.  Each meal should contain half fruits/vegetables, one quarter protein, and one quarter carbs (no bigger than a computer mouse).  Cut down on sweet beverages. This includes juice, soda, and sweet tea. Also watch fruit intake, though this is a healthier sweet option, it still contains natural sugar! Limit to 3 servings daily.  Drink at least 1 glass of water with each meal and aim for at least 8 glasses per day  Exercise at least 150 minutes every week.

## 2023-01-16 NOTE — Assessment & Plan Note (Signed)
Chronic.  Controlled.  Continue crestor 20mg 

## 2023-01-16 NOTE — Progress Notes (Signed)
Subjective:     Patient ID: Andrew Tanner, male    DOB: Mar 21, 1951, 72 y.o.   MRN: [id number]  Chief Complaint  Patient presents with   Follow-up    Discuss starting insulin Not fasting 142, blood sugar this morning   HPI Is accompanied by his wife.   DM 2 w/perif neuropathy- He is taking Metformin 1000 mg twice daily, Jardiance 25 mg daily, and Glipizide 10 mg bid. Sugar at home this morning was 142 and states they have been decreasing over the past few months. States his last A1C was 8.1 about 5 weeks ago(ortho). Expressed desire in starting daily insulin shots in preparation for Whipple and Endocrinology visit. Is following a high protein/ vegetable diet and low carbohydrates. Pt is exercising 30-45 minutes along with walking.  Falls - Complains of pain on is left hip that resulted in two falls in the past. States the pain has not fully improved, but he is able to complete daily task and exercise. Has neuropathy, off balance.  Spinal stimulator seem to be helping.  He did overdo it on his exercise.  Then started with left hip pain.  Pancreatic mass/pre-cancer - Whipple 11/07 with Matthias Hughs, MD. we discussed need to possibly be on insulin after the Whipple.  I am not sure how extensive this will be.  Referral was sent to endocrinology.  Also, patient is ready to start insulin as his sugars have been uncontrolled and maxed out on orals.  He will also need cardiac clearance.  Does not have any chest pains, shortness of breath.  He can walk a flight of stairs etc.  However, he has been long-term uncontrolled diabetes with hyperlipidemia  Health Maintenance Due  Topic Date Due   FOOT EXAM  10/05/2022   DTaP/Tdap/Td (2 - Td or Tdap) 11/23/2022    Past Medical History:  Diagnosis Date   Anxiety    Basal cell carcinoma    Cancer (HCC)    basal cell skin CA   Cataract    Depression    Diabetes mellitus without complication (HCC)    Diabetic neuropathy (HCC)     Hyperlipidemia    Hypertension    IC (interstitial cystitis)    Neuromuscular disorder (HCC) 01/09/21   Peripheral  Neuropathy   Squamous cell carcinoma of skin     Past Surgical History:  Procedure Laterality Date   BIOPSY  12/05/2022   Procedure: BIOPSY;  Surgeon: [name], MD;  Location: [facility];  Service: Gastroenterology;;   CATARACT EXTRACTION, BILATERAL     ESOPHAGOGASTRODUODENOSCOPY N/A 12/05/2022   Procedure: ESOPHAGOGASTRODUODENOSCOPY (EGD);  Surgeon: [name], MD;  Location: [facility];  Service: Gastroenterology;  Laterality: N/A;   EUS N/A 12/05/2022   Procedure: UPPER ENDOSCOPIC ULTRASOUND (EUS) RADIAL;  Surgeon: [name], MD;  Location: [facility];  Service: Gastroenterology;  Laterality: N/A;   FINE NEEDLE ASPIRATION N/A 12/05/2022   Procedure: FINE NEEDLE ASPIRATION (FNA) LINEAR;  Surgeon: [name], MD;  Location: [facility];  Service: Gastroenterology;  Laterality: N/A;   HERNIA REPAIR N/A    inguinal   NECK SURGERY     ant/post   SPINE SURGERY     SUBMUCOSAL TATTOO INJECTION  12/05/2022   Procedure: SUBMUCOSAL TATTOO INJECTION;  Surgeon: [name], MD;  Location: [facility];  Service: Gastroenterology;;   TRANSURETHRAL RESECTION OF PROSTATE     TURP VAPORIZATION       Current Outpatient Medications:    alfuzosin (UROXATRAL)  10 MG 24 hr tablet, Take 1 tablet (10 mg total) by mouth daily with breakfast., Disp: 90 tablet, Rfl: 1   bisacodyl (DULCOLAX) 5 MG EC tablet, Take by mouth., Disp: , Rfl:    clonazePAM (KLONOPIN) 0.5 MG tablet, Take 0.5 mg by mouth 3 (three) times daily as needed., Disp: , Rfl:    dicyclomine (BENTYL) 10 MG capsule, Take 1 capsule (10 mg total) by mouth 4 (four) times daily -  before meals and at bedtime., Disp: 30 capsule, Rfl: 0   empagliflozin (JARDIANCE) 25 MG TABS tablet, Take 1 tablet (25 mg total) by mouth daily before breakfast., Disp: 90 tablet, Rfl:  3   FLUoxetine (PROZAC) 20 MG capsule, Take 60 mg by mouth every morning., Disp: , Rfl:    glipiZIDE (GLUCOTROL) 10 MG tablet, TAKE 2 TABLETS BY MOUTH TWICE DAILY BEFORE A MEAL, Disp: 180 tablet, Rfl: 3   hydrOXYzine (ATARAX/VISTARIL) 50 MG tablet, Take 50 mg by mouth at bedtime., Disp: , Rfl:    ibuprofen (ADVIL,MOTRIN) 800 MG tablet, Take 1 tablet (800 mg total) by mouth every 8 (eight) hours as needed for pain., Disp: 30 tablet, Rfl: 0   insulin glargine (LANTUS SOLOSTAR) 100 UNIT/ML Solostar Pen, Inject 25 Units into the skin daily., Disp: 15 mL, Rfl: PRN   Insulin Pen Needle (PEN NEEDLES) 31G X 5 MM MISC, 1 each by Does not apply route daily at 12 noon., Disp: 100 each, Rfl: 1   ketoconazole (NIZORAL) 2 % shampoo, Apply to scalp and face 2-3 times a week, Disp: 120 mL, Rfl: 4   Magnesium 500 MG TABS, Take 500 mg by mouth daily., Disp: , Rfl:    metFORMIN (GLUCOPHAGE) 1000 MG tablet, Take 1 tablet (1,000 mg total) by mouth 2 (two) times daily with a meal., Disp: 180 tablet, Rfl: 1   nystatin-triamcinolone ointment (MYCOLOG), Apply to scalp and face twice a day for up to 1 week and stop, Disp: 30 g, Rfl: 4   polyethylene glycol powder (GLYCOLAX/MIRALAX) 17 GM/SCOOP powder, Take by mouth as needed., Disp: , Rfl:    rosuvastatin (CRESTOR) 20 MG tablet, Take 1 tablet (20 mg total) by mouth daily., Disp: 90 tablet, Rfl: 3   Thiamine HCl (VITAMIN B-1) 250 MG tablet, Take 250 mg by mouth daily., Disp: , Rfl:    UNABLE TO FIND, Med Name: OUTBACK NEUROPATHY CREAM AND WIPES, Disp: , Rfl:    glucose blood test strip, 1 each by Other route in the morning and at bedtime. Use as instructed checking once per day, or if new symptoms., Disp: 100 each, Rfl: 1  No Known Allergies ROS neg/noncontributory except as noted HPI/below      Objective:     BP (!) 143/81   Pulse 81   Temp 98 F (36.7 C) (Temporal)   Resp 18   Ht 5\' 10"  (1.778 m)   Wt 180 lb 8 oz (81.9 kg)   SpO2 97%   BMI 25.90 kg/m  Wt  Readings from Last 3 Encounters:  01/16/23 180 lb 8 oz (81.9 kg)  01/03/23 183 lb (83 kg)  12/05/22 183 lb (83 kg)    Physical Exam   Gen: WDWN NAD HEENT: NCAT, conjunctiva not injected, sclera nonicteric NECK:  supple, no thyromegaly, no nodes, no carotid bruits CARDIAC: RRR, S1S2+, no murmur. DP 2+B LUNGS: CTAB. No wheezes EXT:  +trace edema left leg MSK: no gross abnormalities.  NEURO: A&O x3.  CN II-XII intact.  PSYCH: normal mood. Good eye  contact     Assessment & Plan:  Type 2 diabetes mellitus with hyperglycemia, without long-term current use of insulin (HCC) Assessment & Plan: Chronic.  Not controlled for long time-hyperglycemia.   Continue jardiance 25mg , glipizide 10mg  bid and metformin 1000mg  bid.  Will continue to work on exercise, etc. will start Lantus 10 units daily.  Increase by 3 units every 3 days to a max of 30.  Keep in touch with sugars.  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  Will refer to endocrinology as he may be on long-term total insulin regimen.  May need continuous glucose monitor and possibly pump.  Will defer all of that to them.  Will get him started on long-acting insulin to try to get his sugars down a bit before the procedure.  He and wife are very motivated now.  Orders: -     Glucose Blood; 1 each by Other route in the morning and at bedtime. Use as instructed checking once per day, or if new symptoms.  Dispense: 100 each; Refill: 1 -     Lantus SoloStar; Inject 25 Units into the skin daily.  Dispense: 15 mL; Refill: PRN -     Pen Needles; 1 each by Does not apply route daily at 12 noon.  Dispense: 100 each; Refill: 1  Polyneuropathy associated with underlying disease (HCC)  Mixed hyperlipidemia Assessment & Plan: Chronic.  Controlled.  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  He will be undergoing a Whipple procedure on November 7.  Referring to  endocrinology for possible insulin therapy.  Will also refer to cardiology for clearance for surgery  Return in about 2 months (around 03/18/2023) for cancel nov appt.  , DM.  Melina Fiddler N Rice,acting as a scribe for Angelena Sole, MD.,have documented all relevant documentation on the behalf of Angelena Sole, MD,as directed by  Angelena Sole, MD while in the presence of Angelena Sole, MD.  I, Angelena Sole, MD, have reviewed all documentation for this visit. The documentation on 01/16/23 for the exam, diagnosis, procedures, and orders are all accurate and complete.    Angelena Sole, MD

## 2023-01-17 ENCOUNTER — Telehealth: Payer: Self-pay | Admitting: Family Medicine

## 2023-01-17 ENCOUNTER — Encounter: Payer: Self-pay | Admitting: Family Medicine

## 2023-01-17 NOTE — Telephone Encounter (Signed)
Please see message below

## 2023-01-17 NOTE — Telephone Encounter (Signed)
Patient states he had to be placed on a waiting list for Cardiologist because they are booked until December. Patient's procedure is 02/16/23.    Endocrinology is unable to schedule Patient until 03/14/23 which is also after Patient's procedure.  Also, Patient's Pharmacy advised Patient they are out of stock of Diabetic supplies until 01/20/23, therefore, Patient states he will start taking Diabetic medication when received.

## 2023-01-20 ENCOUNTER — Ambulatory Visit: Payer: Medicare Other | Attending: Cardiovascular Disease | Admitting: Cardiovascular Disease

## 2023-01-20 ENCOUNTER — Encounter: Payer: Self-pay | Admitting: Cardiovascular Disease

## 2023-01-20 ENCOUNTER — Encounter: Payer: Self-pay | Admitting: Family Medicine

## 2023-01-20 VITALS — BP 126/62 | HR 78 | Ht 71.0 in | Wt 178.0 lb

## 2023-01-20 DIAGNOSIS — Z01810 Encounter for preprocedural cardiovascular examination: Secondary | ICD-10-CM

## 2023-01-20 NOTE — Progress Notes (Signed)
Cardiology Office Note:  .   Date:  01/20/2023  ID:  Andrew Tanner, DOB November 21, 1950, MRN [id number] PCP: Jeani Sow, MD  [facility] Providers Cardiologist:  None    History of Present Illness: .   Andrew Tanner is a 72 y.o. male pancreatic cysts, HTN,    There is some concern for pre cancerous issues   Needs to have Whipple surgery . Here for cardiac clearance   No cardiac history - has DM.   No Cp , no dyspnea he had a preoperative ECG on July 31 which revealed normal sinus rhythm at 85.  He has no ST or T wave changes.  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 Hx  Father died of CHF at age 108  Mother - died of cancer     ROS:   Studies Reviewed: .         Risk Assessment/Calculations:             Physical Exam:   VS:  BP 126/62   Pulse 78   Ht 5\' 11"  (1.803 m)   Wt 178 lb (80.7 kg)   SpO2 98%   BMI 24.83 kg/m    Wt Readings from Last 3 Encounters:  01/20/23 178 lb (80.7 kg)  01/16/23 180 lb 8 oz (81.9 kg)  01/03/23 183 lb (83 kg)    GEN: Well nourished, well developed in no acute distress NECK: No JVD; No carotid bruits CARDIAC: RRR, no murmurs, rubs, gallops RESPIRATORY:  Clear to auscultation without rales, wheezing or rhonchi  ABDOMEN: Soft, non-tender, non-distended EXTREMITIES:  No edema; No deformity ,  distal foot pulses are 2+   ASSESSMENT AND PLAN: .      Pre operative evaluation prior to Whipple procedure on Feb 16, 2023  He has no angina .  No cardiac limitations with walking 2 miles.   There is no indication for an ischemic evaluation.  Will get an echocardiogram to assess his LV function.  This could assist anesthesiology and there choice of different anesthesia agents and management during surgery.   Will see him on an as-needed basis.       Dispo:  PRN   Signed, Kristeen Miss, MD

## 2023-01-20 NOTE — Patient Instructions (Signed)
Medication Instructions:  Your physician recommends that you continue on your current medications as directed. Please refer to the Current Medication list given to you today.  *If you need a refill on your cardiac medications before your next appointment, please call your pharmacy*  Lab Work: None ordered today.  Testing/Procedures: Your physician has requested that you have an echocardiogram. Echocardiography is a painless test that uses sound waves to create images of your heart. It provides your doctor with information about the size and shape of your heart and how well your heart's chambers and valves are working. This procedure takes approximately one hour. There are no restrictions for this procedure. Please do NOT wear cologne, perfume, aftershave, or lotions (deodorant is allowed). Please arrive 15 minutes prior to your appointment time.  Follow-Up: At [facility], you and your health needs are our priority.  As part of our continuing mission to provide you with exceptional heart care, we have created designated Provider Care Teams.  These Care Teams include your primary Cardiologist (physician) and Advanced Practice Providers (APPs -  Physician Assistants and Nurse Practitioners) who all work together to provide you with the care you need, when you need it.  Your next appointment:   As needed  The format for your next appointment:   In Person  Provider:   Kristeen Miss, MD {

## 2023-01-26 ENCOUNTER — Encounter: Payer: Self-pay | Admitting: Family Medicine

## 2023-02-01 ENCOUNTER — Other Ambulatory Visit: Payer: Self-pay | Admitting: *Deleted

## 2023-02-01 DIAGNOSIS — E1165 Type 2 diabetes mellitus with hyperglycemia: Secondary | ICD-10-CM

## 2023-02-06 ENCOUNTER — Other Ambulatory Visit: Payer: Self-pay | Admitting: Family Medicine

## 2023-02-06 ENCOUNTER — Encounter: Payer: Self-pay | Admitting: Family Medicine

## 2023-02-06 ENCOUNTER — Other Ambulatory Visit: Payer: Self-pay | Admitting: *Deleted

## 2023-02-06 MED ORDER — EMPAGLIFLOZIN 25 MG PO TABS: 25.0000 mg | ORAL_TABLET | Freq: Every day | ORAL | 0 refills | Status: DC

## 2023-02-06 MED ORDER — METFORMIN HCL 1000 MG PO TABS: 1000.0000 mg | ORAL_TABLET | Freq: Two times a day (BID) | ORAL | 1 refills | Status: DC

## 2023-02-08 ENCOUNTER — Encounter: Payer: Self-pay | Admitting: Internal Medicine

## 2023-02-08 ENCOUNTER — Ambulatory Visit: Payer: Medicare Other | Admitting: Internal Medicine

## 2023-02-08 VITALS — BP 120/70 | HR 77 | Ht 71.0 in | Wt 179.0 lb

## 2023-02-08 DIAGNOSIS — E7849 Other hyperlipidemia: Secondary | ICD-10-CM | POA: Diagnosis not present

## 2023-02-08 DIAGNOSIS — E1142 Type 2 diabetes mellitus with diabetic polyneuropathy: Secondary | ICD-10-CM

## 2023-02-08 DIAGNOSIS — E1159 Type 2 diabetes mellitus with other circulatory complications: Secondary | ICD-10-CM | POA: Diagnosis not present

## 2023-02-08 DIAGNOSIS — Z7984 Long term (current) use of oral hypoglycemic drugs: Secondary | ICD-10-CM

## 2023-02-08 DIAGNOSIS — E1165 Type 2 diabetes mellitus with hyperglycemia: Secondary | ICD-10-CM | POA: Diagnosis not present

## 2023-02-08 DIAGNOSIS — Z794 Long term (current) use of insulin: Secondary | ICD-10-CM

## 2023-02-08 MED ORDER — EMPAGLIFLOZIN 25 MG PO TABS: 25.0000 mg | ORAL_TABLET | Freq: Every day | ORAL | 0 refills | Status: DC

## 2023-02-08 MED ORDER — FREESTYLE LIBRE 3 PLUS SENSOR MISC: 1.0000 | 3 refills | Status: DC

## 2023-02-08 MED ORDER — INSULIN PEN NEEDLE 32G X 4 MM MISC: 3 refills | Status: AC

## 2023-02-08 MED ORDER — LYUMJEV KWIKPEN 100 UNIT/ML ~~LOC~~ SOPN: PEN_INJECTOR | SUBCUTANEOUS | 11 refills | Status: DC

## 2023-02-08 NOTE — Patient Instructions (Addendum)
Please continue: - Metformin 1000 mg 2x a day, with meals - Jardiance 25 mg before breakfast - Lantus 10 units at bedtime  Stop Glipizide.  Try to start: - Lyumjev 3-6 units before dinner  Please send me the sugars in 4 days.  Try to start the Freestyle Libre CGM.  Please return in 1 month with your sugar log/sensor.   PATIENT INSTRUCTIONS FOR TYPE 2 DIABETES:  DIET AND EXERCISE Diet and exercise is an important part of diabetic treatment.  We recommended aerobic exercise in the form of brisk walking (working between 40-60% of maximal aerobic capacity, similar to brisk walking) for 150 minutes per week (such as 30 minutes five days per week) along with 3 times per week performing 'resistance' training (using various gauge rubber tubes with handles) 5-10 exercises involving the major muscle groups (upper body, lower body and core) performing 10-15 repetitions (or near fatigue) each exercise. Start at half the above goal but build slowly to reach the above goals. If limited by weight, joint pain, or disability, we recommend daily walking in a swimming pool with water up to waist to reduce pressure from joints while allow for adequate exercise.    BLOOD GLUCOSES Monitoring your blood glucoses is important for continued management of your diabetes. Please check your blood glucoses 2-4 times a day: fasting, before meals and at bedtime (you can rotate these measurements - e.g. one day check before the 3 meals, the next day check before 2 of the meals and before bedtime, etc.).   HYPOGLYCEMIA (low blood sugar) Hypoglycemia is usually a reaction to not eating, exercising, or taking too much insulin/ other diabetes drugs.  Symptoms include tremors, sweating, hunger, confusion, headache, etc. Treat IMMEDIATELY with 15 grams of Carbs: 4 glucose tablets  cup regular juice/soda 2 tablespoons raisins 4 teaspoons sugar 1 tablespoon honey Recheck blood glucose in 15 mins and repeat above if still  symptomatic/blood glucose <100.  RECOMMENDATIONS TO REDUCE YOUR RISK OF DIABETIC COMPLICATIONS: * Take your prescribed MEDICATION(S) * Follow a DIABETIC diet: Complex carbs, fiber rich foods, (monounsaturated and polyunsaturated) fats * AVOID saturated/trans fats, high fat foods, >2,300 mg salt per day. * EXERCISE at least 5 times a week for 30 minutes or preferably daily.  * DO NOT SMOKE OR DRINK more than 1 drink a day. * Check your FEET every day. Do not wear tightfitting shoes. Contact us if you develop an ulcer * See your EYE doctor once a year or more if needed * Get a FLU shot once a year * Get a PNEUMONIA vaccine once before and once after age 54 years  GOALS:  * Your Hemoglobin A1c of <7%  * fasting sugars need to be 80-130 * after meals sugars need to be <180 (2h after you start eating) * Your Systolic BP should be 130 or lower  * Your Diastolic BP should be 80 or lower  * Your HDL (Good Cholesterol) should be 40 or higher  * Your LDL (Bad Cholesterol) should be ideally <70. * Your Triglycerides should be 150 or lower  * Your Urine microalbumin (kidney function) should be <30 * Your Body Mass Index should be 25 or lower   Please consider the following ways to cut down carbs and fat and increase fiber and micronutrients in your diet: - substitute whole grain for white bread or pasta - substitute brown rice for white rice - substitute 90-calorie flat bread pieces for slices of bread when possible - substitute sweet potatoes  or yams for white potatoes - substitute humus for margarine - substitute tofu for cheese when possible - substitute almond or rice milk for regular milk (would not drink soy milk daily due to concern for soy estrogen influence on breast cancer risk) - substitute dark chocolate for other sweets when possible - substitute water - can add lemon or orange slices for taste - for diet sodas (artificial sweeteners will trick your body that you can eat sweets  without getting calories and will lead you to overeating and weight gain in the long run) - do not skip breakfast or other meals (this will slow down the metabolism and will result in more weight gain over time)  - can try smoothies made from fruit and almond/rice milk in am instead of regular breakfast - can also try old-fashioned (not instant) oatmeal made with almond/rice milk in am - order the dressing on the side when eating salad at a restaurant (pour less than half of the dressing on the salad) - eat as little meat as possible - can try juicing, but should not forget that juicing will get rid of the fiber, so would alternate with eating raw veg./fruits or drinking smoothies - use as little oil as possible, even when using olive oil - can dress a salad with a mix of balsamic vinegar and lemon juice, for e.g. - use agave nectar, stevia sugar, or regular sugar rather than artificial sweateners - steam or broil/roast veggies  - snack on veggies/fruit/nuts (unsalted, preferably) when possible, rather than processed foods - reduce or eliminate aspartame in diet (it is in diet sodas, chewing gum, etc) Read the labels!  Try to read Dr. Katherina Right book: "Program for Reversing Diabetes" for other ideas for healthy eating.

## 2023-02-08 NOTE — Progress Notes (Addendum)
Surgical Instructions   Your procedure is scheduled on Thursday, 02/16/23. Report to [facility] Main Entrance "A" at 6:30 A.M., then check in with the Admitting office. Any questions or running late day of surgery: call [phone]  Questions prior to your surgery date: call [phone], Monday-Friday, 8am-4pm. If you experience any cold or flu symptoms such as cough, fever, chills, shortness of breath, etc. between now and your scheduled surgery, please notify us at the above number.     Remember:  Do not eat after midnight the night before your surgery  You may drink clear liquids until 5:30am the morning of your surgery.   Clear liquids allowed are: Water, Non-Citrus Juices (without pulp), Carbonated Beverages, Clear Tea, Black Coffee Only (NO MILK, CREAM OR POWDERED CREAMER of any kind), and Gatorade.  Patient Instructions  The night before surgery:  No food after midnight. ONLY clear liquids after midnight  The day of surgery (if you do NOT have diabetes):  Drink TWO (2) Pre-Surgery G2s the night before surgery. Drink in one sitting. Do not sip.  This drink was given to you during your hospital  pre-op appointment visit.   Drink ONE(1) Pre-Surgery G2s by 5:30am the morning of surgery. Drink in one sitting. Do not sip.  This drink was given to you during your hospital  pre-op appointment visit. Nothing else to drink after completing the  Pre-Surgery Clear Ensure.          If you have questions, please contact your surgeon's office.     Take these medicines the morning of surgery with A SIP OF WATER  alfuzosin (UROXATRAL)  clonazePAM (KLONOPIN)  FLUoxetine (PROZAC)  rosuvastatin (CRESTOR)    One week prior to surgery, STOP taking any Aspirin (unless otherwise instructed by your surgeon) Aleve, Naproxen, Ibuprofen, Motrin, Advil, Goody's, BC's, all herbal medications, fish oil, and non-prescription vitamins.  WHAT DO I DO ABOUT MY DIABETES MEDICATION?   Do not  take oral diabetes medicines (pills) the morning of surgery.  Do not take empagliflozin (JARDIANCE) 72 hours prior to surgery. Do not take a dose after 02/12/23.  Do not take metFORMIN (GLUCOPHAGE) the morning of surgery.  THE NIGHT BEFORE SURGERY, take 5 units insulin glargine (LANTUS SOLOSTAR).      THE MORNING OF SURGERY, do not take glipiZIDE (GLUCOTROL).  The day of surgery, do not take other diabetes injectables, including Byetta (exenatide), Bydureon (exenatide ER), Victoza (liraglutide), or Trulicity (dulaglutide).  If your CBG is greater than 220 mg/dL, you may take  of your sliding scale (correction) dose of insulin.   HOW TO MANAGE YOUR DIABETES BEFORE AND AFTER SURGERY  Why is it important to control my blood sugar before and after surgery? Improving blood sugar levels before and after surgery helps healing and can limit problems. A way of improving blood sugar control is eating a healthy diet by:  Eating less sugar and carbohydrates  Increasing activity/exercise  Talking with your doctor about reaching your blood sugar goals High blood sugars (greater than 180 mg/dL) can raise your risk of infections and slow your recovery, so you will need to focus on controlling your diabetes during the weeks before surgery. Make sure that the doctor who takes care of your diabetes knows about your planned surgery including the date and location.  How do I manage my blood sugar before surgery? Check your blood sugar at least 4 times a day, starting 2 days before surgery, to make sure that the level is not too high  or low.  Check your blood sugar the morning of your surgery when you wake up and every 2 hours until you get to the Short Stay unit.  If your blood sugar is less than 70 mg/dL, you will need to treat for low blood sugar: Do not take insulin. Treat a low blood sugar (less than 70 mg/dL) with  cup of clear juice (cranberry or apple), 4 glucose tablets, OR glucose  gel. Recheck blood sugar in 15 minutes after treatment (to make sure it is greater than 70 mg/dL). If your blood sugar is not greater than 70 mg/dL on recheck, call [phone] for further instructions. Report your blood sugar to the short stay nurse when you get to Short Stay.  If you are admitted to the hospital after surgery: Your blood sugar will be checked by the staff and you will probably be given insulin after surgery (instead of oral diabetes medicines) to make sure you have good blood sugar levels. The goal for blood sugar control after surgery is 80-180 mg/dL.                      Do NOT Smoke (Tobacco/Vaping) for 24 hours prior to your procedure.  If you use a CPAP at night, you may bring your mask/headgear for your overnight stay.   You will be asked to remove any contacts, glasses, piercing's, hearing aid's, dentures/partials prior to surgery. Please bring cases for these items if needed.    Patients discharged the day of surgery will not be allowed to drive home, and someone needs to stay with them for 24 hours.  SURGICAL WAITING ROOM VISITATION Patients may have no more than 2 support people in the waiting area - these visitors may rotate.   Pre-op nurse will coordinate an appropriate time for 1 ADULT support person, who may not rotate, to accompany patient in pre-op.  Children under the age of 46 must have an adult with them who is not the patient and must remain in the main waiting area with an adult.  If the patient needs to stay at the hospital during part of their recovery, the visitor guidelines for inpatient rooms apply.  Please refer to the [facility] website for the visitor guidelines for any additional information.   If you received a COVID test during your pre-op visit  it is requested that you wear a mask when out in public, stay away from anyone that may not be feeling well and notify your surgeon if you develop symptoms. If you have been in contact with  anyone that has tested positive in the last 10 days please notify you surgeon.      Pre-operative CHG Bathing Instructions   You can play a key role in reducing the risk of infection after surgery. Your skin needs to be as free of germs as possible. You can reduce the number of germs on your skin by washing with CHG (chlorhexidine gluconate) soap before surgery. CHG is an antiseptic soap that kills germs and continues to kill germs even after washing.   DO NOT use if you have an allergy to chlorhexidine/CHG or antibacterial soaps. If your skin becomes reddened or irritated, stop using the CHG and notify one of our RNs at [phone].              TAKE A SHOWER THE NIGHT BEFORE SURGERY AND THE DAY OF SURGERY    Please keep in mind the following:  DO NOT shave,  including legs and underarms, 48 hours prior to surgery.   You may shave your face before/day of surgery.  Place clean sheets on your bed the night before surgery Use a clean washcloth (not used since being washed) for each shower. DO NOT sleep with pet's night before surgery.  CHG Shower Instructions:  Wash your face and private area with normal soap. If you choose to wash your hair, wash first with your normal shampoo.  After you use shampoo/soap, rinse your hair and body thoroughly to remove shampoo/soap residue.  Turn the water OFF and apply half the bottle of CHG soap to a CLEAN washcloth.  Apply CHG soap ONLY FROM YOUR NECK DOWN TO YOUR TOES (washing for 3-5 minutes)  DO NOT use CHG soap on face, private areas, open wounds, or sores.  Pay special attention to the area where your surgery is being performed.  If you are having back surgery, having someone wash your back for you may be helpful. Wait 2 minutes after CHG soap is applied, then you may rinse off the CHG soap.  Pat dry with a clean towel  Put on clean pajamas    Additional instructions for the day of surgery: DO NOT APPLY any lotions, deodorants, cologne, or  perfumes.   Do not wear jewelry or makeup Do not wear nail polish, gel polish, artificial nails, or any other type of covering on natural nails (fingers and toes) Do not bring valuables to the hospital. [facility] is not responsible for valuables/personal belongings. Put on clean/comfortable clothes.  Please brush your teeth.  Ask your nurse before applying any prescription medications to the skin.

## 2023-02-08 NOTE — Progress Notes (Addendum)
Patient ID: Andrew Tanner, male   DOB: 01-16-1951, 72 y.o.   MRN: [id number]  HPI: Andrew Tanner is a 72 y.o.-year-old male, referred by his PCP, Dr. Ruthine Dose, for management of DM2, dx in ~2010, insulin-dependent, uncontrolled, with complications (aortic atherosclerosis, peripheral neuropathy).  He is here with his wife who offers part of the history, especially regarding diet, medical history, and medication doses.  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  The expectation is that he will require a more intensive insulin regimen after the surgery.  Reviewed HbA1c: 12/02/2022: HbA1c 8.1% Lab Results  Component Value Date   HGBA1C 7.8 (H) 07/25/2022   HGBA1C 8.3 (H) 04/25/2022   HGBA1C 7.4 (H) 10/04/2021   HGBA1C 7.1 (H) 07/15/2021   HGBA1C 7.0 (H) 04/06/2021   HGBA1C 6.8 (A) 12/21/2020   HGBA1C 6.7 (H) 08/17/2020   HGBA1C 6.7 (H) 05/18/2020   HGBA1C 7.8 (H) 01/20/2020   HGBA1C 7.5 (H) 09/12/2019   Pt is on a regimen of: - Metformin 1000 mg 2x a day, with meals - Glipizide 10 mg 2x a day before meals >> taking only 10 mg before breakfast - Jardiance 25 mg before breakfast - Lantus 10 units at bedtime-added 01/18/2023  Pt checks his sugars 2x a day and they are: - am: 96-141 - 2h after b'fast: n/c - before lunch: n/c - 2h after lunch: n/c - before dinner: n/c - 2h after dinner: 144-243 - bedtime: n/c - nighttime: n/c Lowest sugar was 90; ?hypoglycemia awareness. Highest sugar was 373.  Glucometer: FreeStyle  Pt's meals are: - Breakfast: Rice checks cereals, Greek yogurt, blueberries, other fruit, 1% milk - Lunch: Sandwich with meat on whole-grain bread - Dinner: Meat, beans, sweet potatoes - Snacks: 2, but trying to cut down.  These are usually high carb snacks. He is exercising every day along with  walking. He lost wt (from 230 lbs) and lost 40 lbs since then.  - no CKD, last BUN/creatinine:  Lab Results  Component Value Date   BUN 15 12/19/2022   BUN 11 12/08/2022   CREATININE 0.92 12/19/2022   CREATININE 0.83 12/08/2022   Lab Results  Component Value Date   MICRALBCREAT 0.8 04/25/2022   MICRALBCREAT 0.7 04/06/2021   MICRALBCREAT 27 12/31/2018   MICRALBCREAT 4.1 01/01/2018  She is not on ACE inhibitor/ARB.  -+ HL; last set of lipids: Lab Results  Component Value Date   CHOL 126 07/25/2022   HDL 38.40 (L) 07/25/2022   LDLCALC 66 07/25/2022   TRIG 112.0 07/25/2022   CHOLHDL 3 07/25/2022  He is on Crestor 20 mg daily.  - last eye exam was on 08/16/2022. No DR. She has a history of bilateral cataract surgery.  - + neuropathy; + numbness and tingling in his feet. Also, cold feet. He has leg weakness and balance pbs. He was approved for the implanted device -planning to get this in the near future.   Pt has FH of DM in father (DM1), brother.  He also has a history of HTN, interstitial cystitis, history of cervical spine surgery - 2005?, depression/anxiety. He is on thiamine.  ROS: Constitutional: no weight gain, + weight loss, no fatigue, no subjective hyperthermia, no subjective hypothermia, + 2x nocturia Eyes: no blurry vision, no xerophthalmia ENT: no sore throat, no nodules palpated in neck, no dysphagia, no odynophagia, no hoarseness,  no tinnitus, no hypoacusis Cardiovascular: no CP, no SOB, no palpitations, no leg swelling Respiratory: no cough, no SOB, no wheezing Gastrointestinal: no N, no V, no D, no C, no acid reflux Musculoskeletal: no muscle, no joint aches Skin: no rash, no hair loss Neurological: +  tremors, no numbness or tingling/no dizziness/no HAs Psychiatric: no depression, no anxiety +diff. With erections, + low libido  Past Medical History:  Diagnosis Date   Anxiety    Basal cell carcinoma    Cancer (HCC)    basal cell skin CA   Cataract     Depression    Diabetes mellitus without complication (HCC)    Diabetic neuropathy (HCC)    Hyperlipidemia    Hypertension    IC (interstitial cystitis)    Neuromuscular disorder (HCC) 01/09/21   Peripheral  Neuropathy   Squamous cell carcinoma of skin    Past Surgical History:  Procedure Laterality Date   BIOPSY  12/05/2022   Procedure: BIOPSY;  Surgeon: [name], MD;  Location: [facility];  Service: Gastroenterology;;   CATARACT EXTRACTION, BILATERAL     ESOPHAGOGASTRODUODENOSCOPY N/A 12/05/2022   Procedure: ESOPHAGOGASTRODUODENOSCOPY (EGD);  Surgeon: [name], MD;  Location: [facility];  Service: Gastroenterology;  Laterality: N/A;   EUS N/A 12/05/2022   Procedure: UPPER ENDOSCOPIC ULTRASOUND (EUS) RADIAL;  Surgeon: [name], MD;  Location: [facility];  Service: Gastroenterology;  Laterality: N/A;   FINE NEEDLE ASPIRATION N/A 12/05/2022   Procedure: FINE NEEDLE ASPIRATION (FNA) LINEAR;  Surgeon: [name], MD;  Location: [facility];  Service: Gastroenterology;  Laterality: N/A;   HERNIA REPAIR N/A    inguinal   NECK SURGERY     ant/post   SPINE SURGERY     SUBMUCOSAL TATTOO INJECTION  12/05/2022   Procedure: SUBMUCOSAL TATTOO INJECTION;  Surgeon: Meridee Score Netty Starring., MD;  Location: [facility];  Service: Gastroenterology;;   TRANSURETHRAL RESECTION OF PROSTATE     TURP VAPORIZATION     Social History   Socioeconomic History   Marital status: Married    Spouse name: Marilynne Drivers   Number of children: 1   Years of education: Not on file   Highest education level: Bachelor's degree (e.g., BA, AB, BS)  Occupational History   Not on file  Tobacco Use   Smoking status: Former    Current packs/day: 0.00    Average packs/day: 1 pack/day for 5.0 years (5.0 ttl pk-yrs)    Types: Cigarettes    Start date: 03/02/1977    Quit date: 03/02/1982    Years since quitting: 40.9   Smokeless tobacco: Never   Tobacco  comments:    quit 28 yrs ago  Vaping Use   Vaping status: Never Used  Substance and Sexual Activity   Alcohol use: Not Currently    Comment: none   Drug use: No   Sexual activity: Not Currently    Birth control/protection: Abstinence  Other Topics Concern   Not on file  Social History Narrative   Retired Risk analyst, 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   Social Determinants of Health   Financial Resource Strain: Low Risk  (01/03/2023)   Overall Financial Resource Strain (CARDIA)    Difficulty of Paying Living Expenses: Not hard at all  Food Insecurity: No Food Insecurity (01/03/2023)   Hunger Vital Sign    Worried About Running Out of Food in the Last Year: Never true    Ran Out of Food in the Last Year: Never true  Transportation Needs: No Transportation Needs (01/03/2023)   PRAPARE - Administrator, Civil Service (Medical): No    Lack of Transportation (Non-Medical): No  Physical Activity: Sufficiently Active (01/03/2023)   Exercise Vital Sign    Days of Exercise per Week: 6 days    Minutes of Exercise per Session: 30 min  Stress: No Stress Concern Present (01/03/2023)   [employer] of Occupational Health - Occupational Stress Questionnaire    Feeling of Stress : Not at all  Social Connections: Moderately Integrated (01/03/2023)   Social Connection and Isolation Panel [NHANES]    Frequency of Communication with Friends and Family: More than three times a week    Frequency of Social Gatherings with Friends and Family: More than three times a week    Attends Religious Services: More than 4 times per year    Active Member of [employer] or Organizations: No    Attends Banker Meetings: Never    Marital Status: Married  Catering manager Violence: Not At Risk (01/03/2023)   Humiliation, Afraid, Rape, and Kick questionnaire    Fear of Current or Ex-Partner: No    Emotionally Abused: No    Physically Abused: No    Sexually Abused: No   Current  Outpatient Medications on File Prior to Visit  Medication Sig Dispense Refill   alfuzosin (UROXATRAL) 10 MG 24 hr tablet Take 1 tablet (10 mg total) by mouth daily with breakfast. 90 tablet 1   bisacodyl (DULCOLAX) 5 MG EC tablet Take 5 mg by mouth daily as needed for moderate constipation.     clonazePAM (KLONOPIN) 0.5 MG tablet Take 0.5 mg by mouth 3 (three) times daily.     empagliflozin (JARDIANCE) 25 MG TABS tablet Take 1 tablet (25 mg total) by mouth daily before breakfast. 30 tablet 0   FLUoxetine (PROZAC) 20 MG capsule Take 60 mg by mouth every morning.     glipiZIDE (GLUCOTROL) 10 MG tablet TAKE 2 TABLETS BY MOUTH TWICE DAILY BEFORE A MEAL (Patient taking differently: Take 10 mg by mouth daily before breakfast.) 180 tablet 3   glucose blood test strip 1 each by Other route in the morning and at bedtime. Use as instructed checking once per day, or if new symptoms. 100 each 1   hydrOXYzine (ATARAX/VISTARIL) 50 MG tablet Take 50 mg by mouth at bedtime.     ibuprofen (ADVIL,MOTRIN) 800 MG tablet Take 1 tablet (800 mg total) by mouth every 8 (eight) hours as needed for pain. 30 tablet 0   insulin glargine (LANTUS SOLOSTAR) 100 UNIT/ML Solostar Pen Inject 25 Units into the skin daily. (Patient taking differently: Inject 10 Units into the skin at bedtime.) 15 mL PRN   Insulin Pen Needle (PEN NEEDLES) 31G X 5 MM MISC 1 each by Does not apply route daily at 12 noon. 100 each 1   ketoconazole (NIZORAL) 2 % shampoo Apply to scalp and face 2-3 times a week 120 mL 4   metFORMIN (GLUCOPHAGE) 1000 MG tablet Take 1 tablet (1,000 mg total) by mouth 2 (two) times daily with a meal. 180 tablet 1   nystatin-triamcinolone ointment (MYCOLOG) Apply to scalp and face twice a day for up to 1 week and stop 30 g 4   polyethylene glycol powder (GLYCOLAX/MIRALAX) 17 GM/SCOOP powder Take 17 g by mouth daily as needed for moderate constipation.     rosuvastatin (CRESTOR) 20 MG tablet Take 1 tablet (20 mg total) by mouth  daily. 90 tablet 3  No current facility-administered medications on file prior to visit.   No Known Allergies Family History  Problem Relation Age of Onset   Lung cancer Mother    Heart disease Father    Hyperlipidemia Father    Diabetes Father        type 1   Diabetes Brother    Depression Brother    Renal cancer Brother    Anxiety disorder Brother    Stomach cancer Neg Hx    Colon cancer Neg Hx    Pancreatic cancer Neg Hx    Esophageal cancer Neg Hx    Rectal cancer Neg Hx    PE: BP 120/70   Pulse 77   Ht 5\' 11"  (1.803 m)   Wt 179 lb (81.2 kg)   SpO2 97%   BMI 24.97 kg/m  Wt Readings from Last 3 Encounters:  02/08/23 179 lb (81.2 kg)  01/20/23 178 lb (80.7 kg)  01/16/23 180 lb 8 oz (81.9 kg)   Constitutional: normal weight, in NAD Eyes:  EOMI, no exophthalmos ENT: no neck masses, no cervical lymphadenopathy Cardiovascular: RRR, No MRG Respiratory: CTA B Musculoskeletal: no deformities Skin:no rashes Neurological: + tremor with outstretched hands  ASSESSMENT: 1. DM2, insulin-dependent, uncontrolled, with complications - PN - aortic atherosclerosis (on CT abdomen and pelvis from 12/08/2022)  2. HL  PLAN:  1. Patient with long-standing, uncontrolled diabetes, on oral antidiabetic regimen with metformin, sulfonylurea, and SGLT2 inhibitor, to which basal insulin low-dose was recently added.  Latest HbA1c obtained in 07/2022 was higher, at 8.1%.  At today's visit, HbA1c is 7.1%, lower.  Patient and wife also mentioned that they made dietary changes and he is also quite active, exercising (stretching, under desk pedal machine) daily along with walking. -Reviewing his blood sugars, they are mostly at goal in the morning, however, sugars after dinner are usually higher than target.  He is taking glipizide before breakfast but not before dinner.  Unfortunately, we do not have blood sugars after breakfast and after lunch to see how he is doing with these meals.  For now,  my suggestion was that, in preparation for surgery, to stop glipizide and start ultra rapid acting insulin at least before dinner, since we know his sugars are higher after this meal.  However, I advised him to start the CGM (he and his wife agree with this) or to check sugars more consistently even in the middle of the day and to let me know about these in approximately 4 days.  At that time, we can decide if he needs Lyumjev before the rest of the meals.  In the meantime, we will continue his oral medications and also his Lantus dose.  -Will also need to get in touch after the surgery.  At that time, depending on the extent of the pancreatectomy, he may be totally dependent on mealtime insulin, or to use it just as needed.  He may also need a glucagon pen at that time.  Patient and his wife agree with plan. -Patient and wife inquire about diet after surgery, but we discussed that I cannot give them advised about this.  I gave him general advised about diet, but they have an appointment with nutrition in few days, before the surgery, and they may be able to help.  For now, I advised him to stop 1% milk in the morning and switch to almond milk. - I suggested to:  Patient Instructions  Please continue: - Metformin 1000 mg 2x a day, with  meals - Jardiance 25 mg before breakfast - Lantus 10 units at bedtime  Stop Glipizide.  Try to start: - Lyumjev 3-6 units before dinner  Please send me the sugars in 4 days.  Try to start the Freestyle Libre CGM.  Please return in 1 month with your sugar log/sensor.   - Strongly advised him to start checking sugars at different times of the day - check 4x a day, rotating checks - I recommended a CGM. - discussed about CBG targets for treatment: 80-130 mg/dL before meals and <865 mg/dL after meals; target [id number] <7%. - given sugar log and advised how to fill it and to bring it at next appt in case he does not start the sensor - given foot care handout and  explained the principles  - given instructions for hypoglycemia management "15-15 rule"  - advised for yearly eye exams  - he is UTD - Return to clinic in 1 month  2. HL - Reviewed latest lipid panel from 07/2022: Fractions at goal except for slightly low HDL: Lab Results  Component Value Date   CHOL 126 07/25/2022   HDL 38.40 (L) 07/25/2022   LDLCALC 66 07/25/2022   TRIG 112.0 07/25/2022   CHOLHDL 3 07/25/2022  - Continues Crestor 20 mg daily without side effects.  Carlus Pavlov, MD PhD [facility] Endocrinology

## 2023-02-09 ENCOUNTER — Encounter (HOSPITAL_COMMUNITY)
Admission: RE | Admit: 2023-02-09 | Discharge: 2023-02-09 | Disposition: A | Discharge status: Home / Self Care | Payer: Medicare Other | Source: Ambulatory Visit | Attending: General Surgery | Admitting: General Surgery

## 2023-02-09 ENCOUNTER — Other Ambulatory Visit: Payer: Self-pay

## 2023-02-09 ENCOUNTER — Encounter (HOSPITAL_COMMUNITY): Payer: Self-pay

## 2023-02-09 VITALS — BP 124/80 | HR 81 | Temp 98.1°F | Resp 18 | Ht 71.0 in | Wt 177.4 lb

## 2023-02-09 DIAGNOSIS — Z01812 Encounter for preprocedural laboratory examination: Secondary | ICD-10-CM | POA: Diagnosis not present

## 2023-02-09 DIAGNOSIS — E119 Type 2 diabetes mellitus without complications: Secondary | ICD-10-CM | POA: Diagnosis not present

## 2023-02-09 DIAGNOSIS — E1165 Type 2 diabetes mellitus with hyperglycemia: Secondary | ICD-10-CM

## 2023-02-09 DIAGNOSIS — D132 Benign neoplasm of duodenum: Secondary | ICD-10-CM | POA: Diagnosis not present

## 2023-02-09 LAB — GLUCOSE, CAPILLARY: Glucose-Capillary: 306 mg/dL — ABNORMAL HIGH (ref 70–99)

## 2023-02-09 LAB — COMPREHENSIVE METABOLIC PANEL
ALT: 41 U/L (ref 0–44)
AST: 31 U/L (ref 15–41)
Albumin: 3.7 g/dL (ref 3.5–5.0)
Alkaline Phosphatase: 56 U/L (ref 38–126)
Anion gap: 8 (ref 5–15)
BUN: 22 mg/dL (ref 8–23)
CO2: 23 mmol/L (ref 22–32)
Calcium: 9.8 mg/dL (ref 8.9–10.3)
Chloride: 105 mmol/L (ref 98–111)
Creatinine, Ser: 0.96 mg/dL (ref 0.61–1.24)
GFR, Estimated: >60 mL/min (ref >60)
Glucose, Bld: 303 mg/dL — ABNORMAL HIGH (ref 70–99)
Potassium: 3.9 mmol/L (ref 3.5–5.1)
Sodium: 136 mmol/L (ref 135–145)
Total Bilirubin: 0.2 mg/dL — ABNORMAL LOW (ref 0.3–1.2)
Total Protein: 6.2 g/dL — ABNORMAL LOW (ref 6.5–8.1)

## 2023-02-09 LAB — CBC WITH DIFFERENTIAL/PLATELET
Abs Immature Granulocytes: 0.03 10*3/uL (ref 0.00–0.07)
Basophils Absolute: 0.1 10*3/uL (ref 0.0–0.1)
Basophils Relative: 1 %
Eosinophils Absolute: 0.2 10*3/uL (ref 0.0–0.5)
Eosinophils Relative: 3 %
HCT: 46.7 % (ref 39.0–52.0)
Hemoglobin: 15.2 g/dL (ref 13.0–17.0)
Immature Granulocytes: 0 %
Lymphocytes Relative: 8 %
Lymphs Abs: 0.5 10*3/uL — ABNORMAL LOW (ref 0.7–4.0)
MCH: 29.9 pg (ref 26.0–34.0)
MCHC: 32.5 g/dL (ref 30.0–36.0)
MCV: 91.7 fL (ref 80.0–100.0)
Monocytes Absolute: 0.4 10*3/uL (ref 0.1–1.0)
Monocytes Relative: 6 %
Neutro Abs: 5.5 10*3/uL (ref 1.7–7.7)
Neutrophils Relative %: 82 %
Platelets: 207 10*3/uL (ref 150–400)
RBC: 5.09 MIL/uL (ref 4.22–5.81)
RDW: 13.2 % (ref 11.5–15.5)
WBC: 6.7 10*3/uL (ref 4.0–10.5)
nRBC: 0 % (ref 0.0–0.2)

## 2023-02-09 LAB — TYPE AND SCREEN
ABO/RH(D): A POS
Antibody Screen: NEGATIVE

## 2023-02-09 LAB — HEMOGLOBIN A1C
Hgb A1c MFr Bld: 7 % — ABNORMAL HIGH (ref 4.8–5.6)
Mean Plasma Glucose: 154.2 mg/dL

## 2023-02-09 LAB — LIPASE, BLOOD: Lipase: 58 U/L — ABNORMAL HIGH (ref 11–51)

## 2023-02-09 LAB — PROTIME-INR
INR: 0.9 (ref 0.8–1.2)
Prothrombin Time: 12.6 s (ref 11.4–15.2)

## 2023-02-09 NOTE — Progress Notes (Signed)
PCP - Andrew Tanner Cardiologist - Andrew Tanner  PPM/ICD - denies  Chest x-ray - n/a EKG - 11/09/22 Stress Test - denies ECHO - scheduled for this next week before surgery Cardiac Cath - denies  Sleep Study - denies  Fasting Blood Sugar - 97-112 Checks Blood Sugar 2 times a day   7 days prior to surgery STOP taking any Aspirin (unless otherwise instructed by your surgeon), Aleve, Naproxen, Ibuprofen, Motrin, Advil, Goody's, BC's, all herbal medications, fish oil, and all vitamins.   ERAS Protcol -yes PRE-SURGERY Ensure or G2- 2 G2s HS, 1 G2 am of surgery  COVID TEST- not needed   Anesthesia review: yes, patient has an appt for an echocardiogram this week before surgery. Please review and make sure [name] signs off and gives him clearance.   Patient denies shortness of breath, fever, cough and chest pain at PAT appointment   All instructions explained to the patient, with a verbal understanding of the material. Patient agrees to go over the instructions while at home for a better understanding. Patient also instructed to self quarantine after being tested for COVID-19. The opportunity to ask questions was provided.

## 2023-02-10 ENCOUNTER — Encounter (HOSPITAL_COMMUNITY): Payer: Self-pay | Admitting: Physician Assistant

## 2023-02-10 ENCOUNTER — Encounter (HOSPITAL_COMMUNITY): Payer: Self-pay | Admitting: Radiology

## 2023-02-10 ENCOUNTER — Ambulatory Visit (HOSPITAL_COMMUNITY): Payer: Medicare Other | Attending: Cardiovascular Disease

## 2023-02-10 DIAGNOSIS — Z01810 Encounter for preprocedural cardiovascular examination: Secondary | ICD-10-CM | POA: Diagnosis not present

## 2023-02-10 LAB — ECHOCARDIOGRAM COMPLETE
Area-P 1/2: 2.95 cm2
Calc EF: 52 %
S' Lateral: 2.9 cm
Single Plane A2C EF: 53.7 %
Single Plane A4C EF: 48.2 %

## 2023-02-10 NOTE — Progress Notes (Signed)
Patient seen by DOD, Dr. Graciela Husbands regarding echocardiogram results. Images reviewed  and read by Dr. Jens Som. Patient discharged per Dr. Graciela Husbands.

## 2023-02-12 ENCOUNTER — Encounter: Payer: Self-pay | Admitting: Internal Medicine

## 2023-02-12 ENCOUNTER — Encounter: Payer: Self-pay | Admitting: Family Medicine

## 2023-02-13 ENCOUNTER — Encounter: Payer: Medicare Other | Attending: Internal Medicine | Admitting: Nutrition

## 2023-02-13 ENCOUNTER — Ambulatory Visit: Payer: Medicare Other | Admitting: Nutrition

## 2023-02-13 ENCOUNTER — Telehealth: Payer: Self-pay

## 2023-02-13 DIAGNOSIS — E119 Type 2 diabetes mellitus without complications: Secondary | ICD-10-CM | POA: Diagnosis not present

## 2023-02-13 DIAGNOSIS — Z713 Dietary counseling and surveillance: Secondary | ICD-10-CM | POA: Insufficient documentation

## 2023-02-13 DIAGNOSIS — D151 Benign neoplasm of heart: Secondary | ICD-10-CM

## 2023-02-13 NOTE — Patient Instructions (Signed)
Call [facility] help line to see why sensor is not working Call if questions about diet Make sure all meals have carbohydrate, protein and fat Stop all cereal and milk

## 2023-02-13 NOTE — Telephone Encounter (Signed)
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  The adenoma is quite large.  If workup and/or tx of the atrial myxoma is important to his safety under anesthesia, then that would supercede surgery.   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 Grade I DD  Trivial MR  No pericardial effusion  I will discuss with Dr. Donell Beers on Monday.   We could consider getting a TEE for better visualization of this atrial mass.   Will need to determine the urgency of his Whipple procedure

## 2023-02-13 NOTE — Progress Notes (Unsigned)
Patient is here with his wife wanting to review his diet and learn how to eat after his surgery which is this Thursday.  Wife says he will be on clear liquids for months.   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 Medication:  Lantus:  10u at 8:30 PM every night.  Says does not miss this dose.                        Lyumjev  3-6u 5 minutes before supper Exercise:  walks for 20 minutes every morning and does10 minutes of weight bearing exercise.  SBGM:  meter.  Has ordered Libre 3 but is no back order.  He is testing Fasting, and 2hr. Pc S:  FBSs:  90-130,  2hr. pcS: 144-203      He was given a  Libre 3 lot: [id number], exp. 2/25, and this was inserted into his left upper outter arm, but would not start using his Apple 12 phone.  He was told to call the help line when he gets home to see if his phone is compatable with this sensor.  He agreed to do this and call me back. Diet:  Typical day: 7:30 AM:  up.  Walks and does exercise 8AM:  rice chex cereal with 1% milk  and english muffin with margerine-1 tsp.  Or no sugar yogurt with blueberries, and english muffin.  Decaf black tea to drink 12-1:30  Lunch: usually out:  Salad with cheese and grilled shrimp or chicken, light ranch dressing, no crackers.   Water to drink.    Or sandwich on whole wheat with side of grilled non starchy vegetable  5:30-6PM: Supper.  Takes 6u Lyumjev  has meat- 3 ounces, 4 carb servings in the form of sweet potato, rice,  beans, or pasta.   Denies eating between meals or after supper. Discussion:  3 basic food groups, what foods are in each group, serving sizes of each group, and the need for all three food groups at each meal, and how much of each group is needed at each meal.  Meal plan given with 3 servings of carbs at breakfast, 3-4 at lunch and 3-4 at supper.   Need ot adjust the dose of Lyumjev for varying amounts of carbs eaten.   Treatment of low blood sugar--symptoms, treatments  and IOB and how to calculate how much to take. Difference between CGM and SBGM, how to use the [place] 3 and when to test blood sugars.  Goals for ac and 2hr. Pc meals.  Believe overall understanding is good and wife is good helper for him and his motivation is good for good control.  They had no final questions

## 2023-02-13 NOTE — Telephone Encounter (Signed)
Dr Elease Hashimoto called and spoke with patient about results and need for further imaging. He discussed case with Chandrasekhar and opted against TEE and wants MRI instead  (please get a cardiac mri with gadolimium contrast, thanks PN) He would like Chandrasekhar to read MRI. Placed in comments field of order. Order placed at this time.

## 2023-02-14 ENCOUNTER — Telehealth: Payer: Self-pay | Admitting: Cardiovascular Disease

## 2023-02-14 NOTE — Telephone Encounter (Signed)
Patient has his Cardiac MRI scheduled for Jan 10th. Patient stated he is trying to get a sooner appt for his MRI, because now his surgery date would be in Feb/March. Patient is very concerned about waiting this long. Patient wanted to make Dr. Elease Hashimoto aware of this information.

## 2023-02-14 NOTE — Telephone Encounter (Signed)
Called and told pt I would route to MRI schedulers, but not much more I can do to get it done sooner. He was very Adult nurse.

## 2023-02-15 ENCOUNTER — Telehealth (HOSPITAL_COMMUNITY): Payer: Self-pay | Admitting: *Deleted

## 2023-02-15 ENCOUNTER — Encounter (HOSPITAL_COMMUNITY): Payer: Self-pay

## 2023-02-15 NOTE — Telephone Encounter (Signed)
Reaching out to patient to offer assistance regarding upcoming cardiac imaging study; pt verbalizes understanding of appt date/time, parking situation and where to check in, pre-test NPO status and medications ordered, and verified current allergies; name and call back number provided for further questions should they arise Johney Frame RN Navigator Cardiac Imaging [facility] [phone] office [phone] cell  Patient reports previous cervical spine surgery, denies claustrophobia.

## 2023-02-16 ENCOUNTER — Other Ambulatory Visit: Payer: Self-pay | Admitting: Cardiovascular Disease

## 2023-02-16 ENCOUNTER — Ambulatory Visit (HOSPITAL_COMMUNITY)
Admission: RE | Admit: 2023-02-16 | Discharge: 2023-02-16 | Disposition: A | Discharge status: Home / Self Care | Payer: Medicare Other | Source: Ambulatory Visit | Attending: Cardiovascular Disease | Admitting: Cardiovascular Disease

## 2023-02-16 ENCOUNTER — Encounter (HOSPITAL_COMMUNITY): Admission: RE | Payer: Self-pay | Source: Home / Self Care

## 2023-02-16 ENCOUNTER — Inpatient Hospital Stay (HOSPITAL_COMMUNITY): Admission: RE | Admit: 2023-02-16 | Payer: Medicare Other | Source: Home / Self Care | Admitting: General Surgery

## 2023-02-16 DIAGNOSIS — D151 Benign neoplasm of heart: Secondary | ICD-10-CM

## 2023-02-16 SURGERY — WHIPPLE PROCEDURE
Anesthesia: General

## 2023-02-16 MED ORDER — GADOBUTROL 1 MMOL/ML IV SOLN
8.0000 mL | Freq: Once | INTRAVENOUS | Status: AC | PRN
  Administered 2023-02-16: 8 mL via INTRAVENOUS

## 2023-02-20 ENCOUNTER — Encounter: Payer: Self-pay | Admitting: Cardiovascular Disease

## 2023-02-21 ENCOUNTER — Encounter: Payer: Self-pay | Admitting: Family Medicine

## 2023-02-21 ENCOUNTER — Encounter: Payer: Self-pay | Admitting: Internal Medicine

## 2023-02-22 ENCOUNTER — Telehealth: Payer: Self-pay | Admitting: Nutrition

## 2023-02-22 ENCOUNTER — Telehealth: Payer: Self-pay | Admitting: Cardiovascular Disease

## 2023-02-22 ENCOUNTER — Ambulatory Visit: Payer: Medicare Other | Admitting: Family Medicine

## 2023-02-22 DIAGNOSIS — E1165 Type 2 diabetes mellitus with hyperglycemia: Secondary | ICD-10-CM

## 2023-02-22 MED ORDER — DEXCOM G7 SENSOR MISC: 3 refills | Status: DC

## 2023-02-22 NOTE — Telephone Encounter (Signed)
LVM on my phone saying his phone does not support the [place] 3 app.  I gave him a sample of Dexcom G7 with directions on how to set up the app, and link to clarity.

## 2023-02-22 NOTE — Telephone Encounter (Signed)
Spoke with patient and he would like to know if his pre-op clearance form has been received. Will forward to pre-op team

## 2023-02-22 NOTE — Telephone Encounter (Signed)
Patient called to follow-up with Dr. Elease Hashimoto if he is cleared for surgery and wants confirmation sent to Dr. Donell Beers.

## 2023-02-22 NOTE — Telephone Encounter (Signed)
Rx has been sent to [facility].   Requested Prescriptions   Signed Prescriptions Disp Refills   Continuous Glucose Sensor (DEXCOM G7 SENSOR) MISC 6 each 3    Sig: Use to monitor glucose continuously, change every 10 days    Authorizing Provider: Carlus Pavlov    Ordering User: Pollie Meyer

## 2023-02-24 ENCOUNTER — Telehealth: Payer: Self-pay | Admitting: Cardiovascular Disease

## 2023-02-24 NOTE — Telephone Encounter (Signed)
Spoke with pt and informed him that we need to have a formal clearance sent to our fax to complete the pre-op clearance. I made patient aware that Andrew Tanner sent him a MyChart message with our fax number and attn: pre-op team. Patient confirmed that he received the message and was planning to call the requesting provider's office now to let them know.

## 2023-02-24 NOTE — Telephone Encounter (Signed)
Patient stated he is returning RN's call regarding scheduling surgery and will need to have surgery scheduled later than February.

## 2023-02-24 NOTE — Telephone Encounter (Signed)
Pt calling back stating the requesting office has not received clearance but pt states he was cleared from [name]. Per MRI notes from 02/16/23 [name] stated he was cleared to proceed with Whipple. He did not have the fax number at the moment for Dr. Arita Miss office but he states he will c/b with it.

## 2023-02-24 NOTE — Telephone Encounter (Signed)
   Pre-operative Risk Assessment    Patient Name: Andrew Tanner  DOB: November 22, 1950 MRN: [id number]    LAST OV: 01/20/23 DR. NASHER  NEXT OV: 07/07/23 DR. NAHSER  Request for Surgical Clearance    Procedure:   WHIPPLE   Date of Surgery:  Clearance 04/27/23                                 Surgeon:  DR. [name] Surgeon's Group or Practice Name:  [facility] Phone number:  [phone] Fax number:  [phone]   Type of Clearance Requested:   - Medical    Type of Anesthesia:  General    Additional requests/questions:    SignedMichaelle Copas   02/24/2023, 4:01 PM

## 2023-02-24 NOTE — Telephone Encounter (Signed)
   Patient Name: Andrew Tanner  DOB: [birth year] MRN: [id number]  Primary Cardiologist: Kristeen Miss, MD  Chart reviewed as part of pre-operative protocol coverage. Given past medical history and time since last visit, based on ACC/AHA guidelines, Lashun Craver is at acceptable risk for the planned procedure without further cardiovascular testing.   Patient was seen by Dr. Elease Hashimoto on 01/20/2023 and was cleared for surgery at the time.  I will route this recommendation well as the note from most recent office visit with Dr. Elease Hashimoto to the requesting party via Epic fax function and remove from pre-op pool.  Please call with questions.  Joylene Grapes, NP 02/24/2023, 4:05 PM

## 2023-02-27 ENCOUNTER — Encounter: Payer: Self-pay | Admitting: Internal Medicine

## 2023-02-28 DIAGNOSIS — F3341 Major depressive disorder, recurrent, in partial remission: Secondary | ICD-10-CM | POA: Diagnosis not present

## 2023-02-28 NOTE — Telephone Encounter (Signed)
Returned call to patient and left detailed message to call back and clarify what he needs.

## 2023-03-01 NOTE — Telephone Encounter (Signed)
Returned call to patient and clarified that the 07/07/23 appt is to review next steps in managing/treatment plan for his atrial myxoma and no surgery is scheduled for this. He verbalized understanding.

## 2023-03-01 NOTE — Telephone Encounter (Signed)
Pt returning nurses call from yesterday. Please advise 

## 2023-03-13 ENCOUNTER — Encounter: Payer: Self-pay | Admitting: Internal Medicine

## 2023-03-14 ENCOUNTER — Encounter: Payer: Self-pay | Admitting: Internal Medicine

## 2023-03-14 ENCOUNTER — Ambulatory Visit: Payer: Medicare Other | Admitting: Internal Medicine

## 2023-03-14 VITALS — BP 122/70 | HR 73 | Ht 71.0 in | Wt 176.8 lb

## 2023-03-14 DIAGNOSIS — Z794 Long term (current) use of insulin: Secondary | ICD-10-CM

## 2023-03-14 DIAGNOSIS — E7849 Other hyperlipidemia: Secondary | ICD-10-CM

## 2023-03-14 DIAGNOSIS — Z7984 Long term (current) use of oral hypoglycemic drugs: Secondary | ICD-10-CM

## 2023-03-14 DIAGNOSIS — E1165 Type 2 diabetes mellitus with hyperglycemia: Secondary | ICD-10-CM | POA: Diagnosis not present

## 2023-03-14 DIAGNOSIS — E1159 Type 2 diabetes mellitus with other circulatory complications: Secondary | ICD-10-CM | POA: Diagnosis not present

## 2023-03-14 NOTE — Patient Instructions (Addendum)
Please continue: - Metformin 1000 mg 2x a day, with meals - Jardiance 25 mg before breakfast - Lantus 10 units at bedtime - Lyumjev 7 units before lunch and dinner  Try to change b'fast.  Start a multivitamin.  Please return in 1.5-2 months.

## 2023-03-14 NOTE — Progress Notes (Signed)
Patient ID: Andrew Tanner, male   DOB: 06/13/1950, 72 y.o.   MRN: [id number]  HPI: Andrew Tanner is a 72 y.o.-year-old male, initially referred by his PCP, Dr. Ruthine Tanner, returning for follow-up for DM2, dx in ~2010, insulin-dependent, uncontrolled, with complications (aortic atherosclerosis, peripheral neuropathy).  Last visit approximately 1 month ago.  Interim history: No increased urination, blurry vision, nausea, chest pain. He noticed a little brain fog. 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 This diagnosis will be revisited after the Whipple sx 04/26/2022.  Patient was referred to endocrinology due to history of a pancreatic cyst, which was biopsied (acellular specimen, but elevated CEA in the sample of fluid) for which he will have Whipple surgery next month.  The expectation is that he will require a more intensive insulin regimen after the surgery.  Reviewed HbA1c: Lab Results  Component Value Date   HGBA1C 7.0 (H) 02/09/2023   HGBA1C 7.8 (H) 07/25/2022   HGBA1C 8.3 (H) 04/25/2022   HGBA1C 7.4 (H) 10/04/2021   HGBA1C 7.1 (H) 07/15/2021   HGBA1C 7.0 (H) 04/06/2021   HGBA1C 6.8 (A) 12/21/2020   HGBA1C 6.7 (H) 08/17/2020   HGBA1C 6.7 (H) 05/18/2020   HGBA1C 7.8 (H) 01/20/2020  12/02/2022: HbA1c 8.1%  Pt is on a regimen of: - Metformin 1000 mg 2x a day, with meals - Glipizide 10 mg 2x a day before meals >> taking only 10 mg before breakfast -stopped 01/2023 - Jardiance 25 mg before breakfast - Lantus 10 units at bedtime-added 01/18/2023 - Lyumjev 3 to 6 units before dinner-started 01/2023 >> 7 units 2x a day: L and D  Pt checks his sugars >4 times a day with his CGM:  Prev.: - am: 96-141 - 2h after b'fast: n/c - before lunch: n/c - 2h after lunch: n/c - before dinner: n/c - 2h after dinner: 144-243 - bedtime: n/c - nighttime: n/c Lowest sugar was 90 >> 57; ?hypoglycemia awareness. Highest sugar was 373 >> 200.  Glucometer: FreeStyle  Pt's meals are: -  Breakfast: Rice checks cereals, Greek yogurt, blueberries, other fruit,  >> English muffin + PB - Lunch: Sandwich with meat on whole-grain bread - Dinner: Meat, beans, sweet potatoes - Snacks: 2, but trying to cut down.  These are usually high carb snacks. He is exercising every day along with walking. Stretching and strenghthening + walking 5 days a week. He lost wt (from 230 lbs) and lost 40 lbs since then.  - no CKD, last BUN/creatinine:  Lab Results  Component Value Date   BUN 22 02/09/2023   BUN 15 12/19/2022   CREATININE 0.96 02/09/2023   CREATININE 0.92 12/19/2022   Lab Results  Component Value Date   MICRALBCREAT 0.8 04/25/2022   MICRALBCREAT 0.7 04/06/2021   MICRALBCREAT 27 12/31/2018   MICRALBCREAT 4.1 01/01/2018  She is not on ACE inhibitor/ARB.  -+ HL; last set of lipids: Lab Results  Component Value Date   CHOL 126 07/25/2022   HDL 38.40 (L) 07/25/2022   LDLCALC 66 07/25/2022   TRIG 112.0 07/25/2022   CHOLHDL 3 07/25/2022  He is on Crestor 20 mg daily.  - last eye exam was on 08/16/2022. No DR. She has a history of bilateral cataract surgery.  - + neuropathy; + numbness and tingling in his R > L foot Also, cold feet. He has leg weakness and balance pbs. He was approved for the implanted device -planning to get this in the near future.   Pt has FH of DM  in father (DM1), brother.  He also has a history of HTN, interstitial cystitis, history of cervical spine surgery - 2005?, depression/anxiety. He is on thiamine.  ROS: + See HPI + nocturia, + tremors  Past Medical History:  Diagnosis Date   Anxiety    Basal cell carcinoma    Cancer (HCC)    basal cell skin CA   Cataract    Depression    Diabetes mellitus without complication (HCC)    Diabetic neuropathy (HCC)    Hyperlipidemia    Hypertension    IC (interstitial cystitis)    Neuromuscular disorder (HCC) 01/09/21   Peripheral  Neuropathy   Squamous cell carcinoma of skin    Past Surgical History:   Procedure Laterality Date   BIOPSY  12/05/2022   Procedure: BIOPSY;  Surgeon: Andrew Tanner., MD;  Location: [facility];  Service: Gastroenterology;;   CATARACT EXTRACTION, BILATERAL     ESOPHAGOGASTRODUODENOSCOPY N/A 12/05/2022   Procedure: ESOPHAGOGASTRODUODENOSCOPY (EGD);  Surgeon: Andrew Tanner., MD;  Location: [facility];  Service: Gastroenterology;  Laterality: N/A;   EUS N/A 12/05/2022   Procedure: UPPER ENDOSCOPIC ULTRASOUND (EUS) RADIAL;  Surgeon: Andrew Tanner., MD;  Location: [facility];  Service: Gastroenterology;  Laterality: N/A;   FINE NEEDLE ASPIRATION N/A 12/05/2022   Procedure: FINE NEEDLE ASPIRATION (FNA) LINEAR;  Surgeon: Andrew Tanner., MD;  Location: [facility];  Service: Gastroenterology;  Laterality: N/A;   HERNIA REPAIR N/A    inguinal   NECK SURGERY     ant/post   SPINE SURGERY     SUBMUCOSAL TATTOO INJECTION  12/05/2022   Procedure: SUBMUCOSAL TATTOO INJECTION;  Surgeon: Andrew Tanner., MD;  Location: [facility];  Service: Gastroenterology;;   TRANSURETHRAL RESECTION OF PROSTATE     TURP VAPORIZATION     Social History   Socioeconomic History   Marital status: Married    Spouse name: Andrew Tanner   Number of children: 1   Years of education: Not on file   Highest education level: Bachelor's degree (e.g., BA, AB, BS)  Occupational History   Not on file  Tobacco Use   Smoking status: Former    Current packs/day: 0.00    Average packs/day: 1 pack/day for 5.0 years (5.0 ttl pk-yrs)    Types: Cigarettes    Start date: 03/02/1977    Quit date: 03/02/1982    Years since quitting: 41.0   Smokeless tobacco: Never   Tobacco comments:    quit 28 yrs ago  Vaping Use   Vaping status: Never Used  Substance and Sexual Activity   Alcohol use: Not Currently    Comment: none   Drug use: No   Sexual activity: Not Currently    Birth control/protection: Abstinence  Other Topics Concern   Not on file  Social  History Narrative   Retired Risk analyst, 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   Social Determinants of Health   Financial Resource Strain: Low Risk  (01/03/2023)   Overall Financial Resource Strain (CARDIA)    Difficulty of Paying Living Expenses: Not hard at all  Food Insecurity: No Food Insecurity (01/03/2023)   Hunger Vital Sign    Worried About Running Out of Food in the Last Year: Never true    Ran Out of Food in the Last Year: Never true  Transportation Needs: No Transportation Needs (01/03/2023)   PRAPARE - Administrator, Civil Service (Medical): No    Lack of Transportation (Non-Medical): No  Physical Activity:  Sufficiently Active (01/03/2023)   Exercise Vital Sign    Days of Exercise per Week: 6 days    Minutes of Exercise per Session: 30 min  Stress: No Stress Concern Present (01/03/2023)   [employer] of Occupational Health - Occupational Stress Questionnaire    Feeling of Stress : Not at all  Social Connections: Moderately Integrated (01/03/2023)   Social Connection and Isolation Panel [NHANES]    Frequency of Communication with Friends and Family: More than three times a week    Frequency of Social Gatherings with Friends and Family: More than three times a week    Attends Religious Services: More than 4 times per year    Active Member of [employer] or Organizations: No    Attends Banker Meetings: Never    Marital Status: Married  Catering manager Violence: Not At Risk (01/03/2023)   Humiliation, Afraid, Rape, and Kick questionnaire    Fear of Current or Ex-Partner: No    Emotionally Abused: No    Physically Abused: No    Sexually Abused: No   Current Outpatient Medications on File Prior to Visit  Medication Sig Dispense Refill   alfuzosin (UROXATRAL) 10 MG 24 hr tablet Take 1 tablet (10 mg total) by mouth daily with breakfast. 90 tablet 1   bisacodyl (DULCOLAX) 5 MG EC tablet Take 5 mg by mouth daily as needed for moderate constipation.      clonazePAM (KLONOPIN) 0.5 MG tablet Take 0.5 mg by mouth 3 (three) times daily.     Continuous Glucose Sensor (DEXCOM G7 SENSOR) MISC Use to monitor glucose continuously, change every 10 days 6 each 3   empagliflozin (JARDIANCE) 25 MG TABS tablet Take 1 tablet (25 mg total) by mouth daily before breakfast. 30 tablet 0   FLUoxetine (PROZAC) 20 MG capsule Take 60 mg by mouth every morning.     glipiZIDE (GLUCOTROL) 10 MG tablet TAKE 2 TABLETS BY MOUTH TWICE DAILY BEFORE A MEAL (Patient taking differently: Take 10 mg by mouth daily before breakfast.) 180 tablet 3   glucose blood test strip 1 each by Other route in the morning and at bedtime. Use as instructed checking once per day, or if new symptoms. 100 each 1   hydrOXYzine (ATARAX/VISTARIL) 50 MG tablet Take 50 mg by mouth at bedtime.     ibuprofen (ADVIL,MOTRIN) 800 MG tablet Take 1 tablet (800 mg total) by mouth every 8 (eight) hours as needed for pain. 30 tablet 0   insulin glargine (LANTUS SOLOSTAR) 100 UNIT/ML Solostar Pen Inject 25 Units into the skin daily. (Patient taking differently: Inject 10 Units into the skin at bedtime.) 15 mL PRN   Insulin Lispro-aabc (LYUMJEV KWIKPEN) 100 UNIT/ML KwikPen Inject 3-6 units under skin of insulin before dinner 15 mL 11   Insulin Pen Needle 32G X 4 MM MISC Use 2x a day 200 each 3   ketoconazole (NIZORAL) 2 % shampoo Apply to scalp and face 2-3 times a week 120 mL 4   metFORMIN (GLUCOPHAGE) 1000 MG tablet Take 1 tablet (1,000 mg total) by mouth 2 (two) times daily with a meal. 180 tablet 1   nystatin-triamcinolone ointment (MYCOLOG) Apply to scalp and face twice a day for up to 1 week and stop 30 g 4   polyethylene glycol powder (GLYCOLAX/MIRALAX) 17 GM/SCOOP powder Take 17 g by mouth daily as needed for moderate constipation.     rosuvastatin (CRESTOR) 20 MG tablet Take 1 tablet (20 mg total) by mouth daily. 90 tablet  3   No current facility-administered medications on file prior to visit.   No  Known Allergies Family History  Problem Relation Age of Onset   Lung cancer Mother    Heart disease Father    Hyperlipidemia Father    Diabetes Father        type 1   Diabetes Brother    Depression Brother    Renal cancer Brother    Anxiety disorder Brother    Stomach cancer Neg Hx    Colon cancer Neg Hx    Pancreatic cancer Neg Hx    Esophageal cancer Neg Hx    Rectal cancer Neg Hx    PE: BP 122/70   Pulse 73   Ht 5\' 11"  (1.803 m)   Wt 176 lb 12.8 oz (80.2 kg)   SpO2 97%   BMI 24.66 kg/m  Wt Readings from Last 3 Encounters:  03/14/23 176 lb 12.8 oz (80.2 kg)  02/13/23 177 lb (80.3 kg)  02/09/23 177 lb 6.4 oz (80.5 kg)   Constitutional: normal weight, in NAD Eyes:  EOMI, no exophthalmos ENT: no neck masses, no cervical lymphadenopathy Cardiovascular: RRR, No MRG Respiratory: CTA B Musculoskeletal: no deformities Skin:no rashes Neurological: no tremor with outstretched hands Diabetic Foot Exam - Simple   Simple Foot Form Diabetic Foot exam was performed with the following findings: Yes 03/14/2023  8:36 AM  Visual Inspection No deformities, no ulcerations, no other skin breakdown bilaterally: Yes Sensation Testing Intact to touch and monofilament testing bilaterally: Yes Pulse Check Posterior Tibialis and Dorsalis pulse intact bilaterally: Yes Comments Slight decreased sensation to monofilament in L hallux    ASSESSMENT: 1. DM2, insulin-dependent, uncontrolled, with complications - PN - aortic atherosclerosis (on CT abdomen and pelvis from 12/08/2022)  2. HL  PLAN:  1. Patient with longstanding, uncontrolled, type 2 diabetes, on oral antidiabetic regimen with metformin, SGLT2 inhibitor, and also basal/bolus insulin regimen, with ultra rapid acting insulin added at last visit to replace sulfonylurea.  At that time, HbA1c returned at 7.1%, lower.  He was referred by his PCP for diabetes management in preparation for Whipple procedure for pancreatic mass.  At last  visit, reviewing his blood sugars they were mostly at goal in the morning but sugars after dinner are higher than target.  At last visit, we discussed about the fact that he may need mealtime insulin before each meal after his pancreatectomy, depending on the extent of the surgery.  At last visit we discussed about improving diet and she was preparing for an appointment with nutrition.  I advised him to start a 1% milk in the morning and switch to almond milk.  We discussed about checking blood sugars at different times of the day, rotating check times and I actually recommended the CGM.  He was able to obtain this since last visit. -He contacted me with improving blood sugars since last visit and we adjusted his Lyumjev insulin. CGM interpretation: -At today's visit, we reviewed his CGM downloads: It appears that 85% of values are in target range (goal >70%), while 15% are higher than 180 (goal <25%), and 0% are lower than 70 (goal <4%).  The calculated average blood sugar is 134.  The projected HbA1c for the next 3 months (GMI) is 6.5%. -Reviewing the CGM trends, sugars appear to be mostly at goal but with the hyperglycemic peak almost consistently after breakfast.  He eliminated regular meal for breakfast and now has almond milk but he does have an English muffin.  We discussed about trying to change this and experiment with different food.  I did not recommend high-protein and high fat meals like eggs plus bacon although these would not cause an increase in blood sugars afterwards.  We discussed about healthier alternatives.  We did discuss that he still observed higher blood sugars after breakfast and then after changing breakfast, he may need a lower Tanner of Lyumjev before this meal, also.  He takes approximately 7 units before lunch and dinner, with good results.  We will not change these for now. - I suggested to:  Patient Instructions  Please continue: - Metformin 1000 mg 2x a day, with meals -  Jardiance 25 mg before breakfast - Lantus 10 units at bedtime - Lyumjev 7 units before lunch and dinner  Try to change b'fast.  Start a multivitamin.  Please return in 1.5-2 months.  - advised to check sugars at different times of the day - 4x a day, rotating check times - advised for yearly eye exams >> he is UTD - he describes mental fog.  This does not necessarily happen with hypo and hyperglycemia.  We discussed about possibly starting a multivitamin. - return to clinic in 1.5-2 months  2. HL -Reviewed latest lipid panel from 07/2022: Fractions at goal except for a slightly low HDL: Lab Results  Component Value Date   CHOL 126 07/25/2022   HDL 38.40 (L) 07/25/2022   LDLCALC 66 07/25/2022   TRIG 112.0 07/25/2022   CHOLHDL 3 07/25/2022  - he is on Crestor 20 mg daily without side effects  Carlus Pavlov, MD PhD [facility]

## 2023-03-15 LAB — MICROALBUMIN / CREATININE URINE RATIO
Creatinine, Urine: 66 mg/dL (ref 20–320)
Microalb Creat Ratio: 3 mg/g{creat} (ref <30)
Microalb, Ur: 0.2 mg/dL

## 2023-03-20 ENCOUNTER — Encounter: Payer: Self-pay | Admitting: Family Medicine

## 2023-03-20 ENCOUNTER — Ambulatory Visit (INDEPENDENT_AMBULATORY_CARE_PROVIDER_SITE_OTHER): Payer: Medicare Other | Admitting: Family Medicine

## 2023-03-20 VITALS — BP 124/78 | HR 74 | Temp 97.7°F | Resp 18 | Ht 70.0 in | Wt 176.1 lb

## 2023-03-20 DIAGNOSIS — Z794 Long term (current) use of insulin: Secondary | ICD-10-CM | POA: Diagnosis not present

## 2023-03-20 DIAGNOSIS — E1165 Type 2 diabetes mellitus with hyperglycemia: Secondary | ICD-10-CM | POA: Diagnosis not present

## 2023-03-20 DIAGNOSIS — Z7984 Long term (current) use of oral hypoglycemic drugs: Secondary | ICD-10-CM | POA: Diagnosis not present

## 2023-03-20 DIAGNOSIS — E7800 Pure hypercholesterolemia, unspecified: Secondary | ICD-10-CM

## 2023-03-20 MED ORDER — ROSUVASTATIN CALCIUM 20 MG PO TABS: 20.0000 mg | ORAL_TABLET | Freq: Every day | ORAL | 3 refills | Status: DC

## 2023-03-20 MED ORDER — ALFUZOSIN HCL ER 10 MG PO TB24: 10.0000 mg | ORAL_TABLET | Freq: Every day | ORAL | 3 refills | Status: DC

## 2023-03-20 NOTE — Patient Instructions (Addendum)
It was very nice to see you today!  Happy Holidays!  Good luck w/surgery  Adult nurse Sports Medicine at [facility]  [address] on the 1st floor Phone number [phone]    PLEASE NOTE:  If you had any lab tests please let us know if you have not heard back within a few days. You may see your results on MyChart before we have a chance to review them but we will give you a call once they are reviewed by [place]. If we ordered any referrals today, please let us know if you have not heard from their office within the next week.   Please try these tips to maintain a healthy lifestyle:  Eat most of your calories during the day when you are active. Eliminate processed foods including packaged sweets (pies, cakes, cookies), reduce intake of potatoes, white bread, white pasta, and white rice. Look for whole grain options, oat flour or almond flour.  Each meal should contain half fruits/vegetables, one quarter protein, and one quarter carbs (no bigger than a computer mouse).  Cut down on sweet beverages. This includes juice, soda, and sweet tea. Also watch fruit intake, though this is a healthier sweet option, it still contains natural sugar! Limit to 3 servings daily.  Drink at least 1 glass of water with each meal and aim for at least 8 glasses per day  Exercise at least 150 minutes every week.

## 2023-03-20 NOTE — Assessment & Plan Note (Signed)
Chronic.  Controlled.  Continue crestor 20mg 

## 2023-03-20 NOTE — Progress Notes (Signed)
Subjective:     Patient ID: Andrew Tanner, male    DOB: Jul 18, 1950, 72 y.o.   MRN: [id number]  Chief Complaint  Patient presents with   Medical Management of Chronic Issues    2 month follow-up on dm Not fasting   HPI Is accompanied by his wife.   DM 2 w/perif neuropathy- He is taking Metformin 1000 mg twice daily, Jardiance 25 mg daily, and glargine 10u and lispro bid 6 units-being managed by endo now as uncontrolled but will be having a whipple and not sure of insulin needs. Is following a high protein/ vegetable diet and low carbohydrates. Pt is exercising 30-45 minutes along with walking.  A1C 6.5 and average 129 w/CGM  spikes after breakfast, but then back down.   No cp/sob  Falls - Complains of pain on is left hip that resulted in two falls in the past. States the pain has not fully improved, but he is able to complete daily task and exercise. Has neuropathy, off balance.  Spinal stimulator test seem to be helping but on hold d/t whipple upcoming.  When walks, bad pain L hip.    Pancreatic mass/pre-cancer - Whipple Jan 2025 with Matthias Hughs, MD.   Does not have any chest pains, shortness of breath.  He can walk a flight of stairs etc.   Atrial myxoma-seeing card.  Will f/u after whipple as no obstruction Health Maintenance Due  Topic Date Due   DTaP/Tdap/Td (2 - Td or Tdap) 11/23/2022    Past Medical History:  Diagnosis Date   Anxiety    Basal cell carcinoma    Cancer (HCC)    basal cell skin CA   Cataract    Depression    Diabetes mellitus without complication (HCC)    Diabetic neuropathy (HCC)    Hyperlipidemia    Hypertension    IC (interstitial cystitis)    Neuromuscular disorder (HCC) 01/09/21   Peripheral  Neuropathy   Squamous cell carcinoma of skin     Past Surgical History:  Procedure Laterality Date   BIOPSY  12/05/2022   Procedure: BIOPSY;  Surgeon: [name], MD;  Location: [facility];  Service: Gastroenterology;;    CATARACT EXTRACTION, BILATERAL     ESOPHAGOGASTRODUODENOSCOPY N/A 12/05/2022   Procedure: ESOPHAGOGASTRODUODENOSCOPY (EGD);  Surgeon: [name], MD;  Location: [facility];  Service: Gastroenterology;  Laterality: N/A;   EUS N/A 12/05/2022   Procedure: UPPER ENDOSCOPIC ULTRASOUND (EUS) RADIAL;  Surgeon: [name], MD;  Location: [facility];  Service: Gastroenterology;  Laterality: N/A;   FINE NEEDLE ASPIRATION N/A 12/05/2022   Procedure: FINE NEEDLE ASPIRATION (FNA) LINEAR;  Surgeon: [name], MD;  Location: [facility];  Service: Gastroenterology;  Laterality: N/A;   HERNIA REPAIR N/A    inguinal   NECK SURGERY     ant/post   SPINE SURGERY     SUBMUCOSAL TATTOO INJECTION  12/05/2022   Procedure: SUBMUCOSAL TATTOO INJECTION;  Surgeon: [name], MD;  Location: [facility];  Service: Gastroenterology;;   TRANSURETHRAL RESECTION OF PROSTATE     TURP VAPORIZATION       Current Outpatient Medications:    bisacodyl (DULCOLAX) 5 MG EC tablet, Take 5 mg by mouth daily as needed for moderate constipation., Disp: , Rfl:    clonazePAM (KLONOPIN) 0.5 MG tablet, Take 0.5 mg by mouth 3 (three) times daily., Disp: , Rfl:    Continuous Glucose Sensor (DEXCOM G7 SENSOR) MISC, Use to monitor glucose continuously, change every  10 days, Disp: 6 each, Rfl: 3   empagliflozin (JARDIANCE) 25 MG TABS tablet, Take 1 tablet (25 mg total) by mouth daily before breakfast., Disp: 30 tablet, Rfl: 0   FLUoxetine (PROZAC) 20 MG capsule, Take 60 mg by mouth every morning., Disp: , Rfl:    glucose blood test strip, 1 each by Other route in the morning and at bedtime. Use as instructed checking once per day, or if new symptoms., Disp: 100 each, Rfl: 1   hydrOXYzine (ATARAX/VISTARIL) 50 MG tablet, Take 50 mg by mouth at bedtime., Disp: , Rfl:    ibuprofen (ADVIL,MOTRIN) 800 MG tablet, Take 1 tablet (800 mg total) by mouth every 8 (eight) hours as needed for pain.,  Disp: 30 tablet, Rfl: 0   insulin glargine (LANTUS SOLOSTAR) 100 UNIT/ML Solostar Pen, Inject 25 Units into the skin daily. (Patient taking differently: Inject 10 Units into the skin at bedtime.), Disp: 15 mL, Rfl: PRN   Insulin Lispro-aabc (LYUMJEV KWIKPEN) 100 UNIT/ML KwikPen, Inject 3-6 units under skin of insulin before dinner, Disp: 15 mL, Rfl: 11   Insulin Pen Needle 32G X 4 MM MISC, Use 2x a day, Disp: 200 each, Rfl: 3   ketoconazole (NIZORAL) 2 % shampoo, Apply to scalp and face 2-3 times a week, Disp: 120 mL, Rfl: 4   metFORMIN (GLUCOPHAGE) 1000 MG tablet, Take 1 tablet (1,000 mg total) by mouth 2 (two) times daily with a meal., Disp: 180 tablet, Rfl: 1   nystatin-triamcinolone ointment (MYCOLOG), Apply to scalp and face twice a day for up to 1 week and stop, Disp: 30 g, Rfl: 4   polyethylene glycol powder (GLYCOLAX/MIRALAX) 17 GM/SCOOP powder, Take 17 g by mouth daily as needed for moderate constipation., Disp: , Rfl:    alfuzosin (UROXATRAL) 10 MG 24 hr tablet, Take 1 tablet (10 mg total) by mouth daily with breakfast., Disp: 90 tablet, Rfl: 3   rosuvastatin (CRESTOR) 20 MG tablet, Take 1 tablet (20 mg total) by mouth daily., Disp: 90 tablet, Rfl: 3  No Known Allergies ROS neg/noncontributory except as noted HPI/below  Paints for stress/therapy. R ear clogged-usu gets yearly.  No GI issues.       Objective:     BP 124/78   Pulse 74   Temp 97.7 F (36.5 C) (Temporal)   Resp 18   Ht 5\' 10"  (1.778 m)   Wt 176 lb 2 oz (79.9 kg)   SpO2 99%   BMI 25.27 kg/m  Wt Readings from Last 3 Encounters:  03/20/23 176 lb 2 oz (79.9 kg)  03/14/23 176 lb 12.8 oz (80.2 kg)  02/13/23 177 lb (80.3 kg)    Physical Exam   Gen: WDWN NAD HEENT: NCAT, conjunctiva not injected, sclera nonicteric NECK:  supple, no thyromegaly, no nodes, no carotid bruits CARDIAC: RRR, S1S2+, no murmur. DP 2+B LUNGS: CTAB. No wheezes EXT:  no edema MSK: no gross abnormalities.  NEURO: A&O x3.  CN II-XII  intact.  PSYCH: normal mood. Good eye contact  Card notes reviewed and endo     Assessment & Plan:  Type 2 diabetes mellitus with hyperglycemia, without long-term current use of insulin (HCC) Assessment & Plan: Chronic.  Not controlled for long time-hyperglycemia.   Continue jardiance 25mg ,  and metformin 1000mg  bid.  Now on glargine 10 units and insulin per endoWill continue to work on exercise, etc.    Pure hypercholesterolemia Assessment & Plan: Chronic.  Controlled.  Continue crestor 20mg   Orders: -  Rosuvastatin Calcium; Take 1 tablet (20 mg total) by mouth daily.  Dispense: 90 tablet; Refill: 3  Other orders -     Alfuzosin HCl ER; Take 1 tablet (10 mg total) by mouth daily with breakfast.  Dispense: 90 tablet; Refill: 3  -Chronic.Neoplasm of pancreatic duct-per patient, some precancerous cells.  He will be undergoing a Whipple procedure in Jan.  Seeing endo  Return in about 3 months (around 06/18/2023) for chronic follow-up.   Angelena Sole, MD

## 2023-03-20 NOTE — Assessment & Plan Note (Signed)
Chronic.  Not controlled for long time-hyperglycemia.   Continue jardiance 25mg ,  and metformin 1000mg  bid.  Now on glargine 10 units and insulin per endoWill continue to work on exercise, etc.

## 2023-03-21 ENCOUNTER — Encounter: Payer: Self-pay | Admitting: Internal Medicine

## 2023-03-31 ENCOUNTER — Encounter: Payer: Self-pay | Admitting: Family Medicine

## 2023-03-31 ENCOUNTER — Other Ambulatory Visit: Payer: Self-pay | Admitting: *Deleted

## 2023-03-31 ENCOUNTER — Other Ambulatory Visit: Payer: Self-pay | Admitting: Family Medicine

## 2023-03-31 DIAGNOSIS — E1165 Type 2 diabetes mellitus with hyperglycemia: Secondary | ICD-10-CM

## 2023-03-31 MED ORDER — EMPAGLIFLOZIN 25 MG PO TABS: 25.0000 mg | ORAL_TABLET | Freq: Every day | ORAL | 0 refills | Status: DC

## 2023-04-14 DIAGNOSIS — F3341 Major depressive disorder, recurrent, in partial remission: Secondary | ICD-10-CM | POA: Diagnosis not present

## 2023-04-16 ENCOUNTER — Other Ambulatory Visit: Payer: Self-pay | Admitting: Dermatology

## 2023-04-18 ENCOUNTER — Encounter: Payer: Medicare Other | Attending: Family Medicine | Admitting: Nutrition

## 2023-04-18 VITALS — Wt 175.3 lb

## 2023-04-18 DIAGNOSIS — E1165 Type 2 diabetes mellitus with hyperglycemia: Secondary | ICD-10-CM | POA: Diagnosis not present

## 2023-04-19 NOTE — Patient Instructions (Signed)
 Eat smaller, more frequent meals, discussed 200-300 calories, 5-6X/day.   Eat slowly, and chew food thoroughly and/or may need to grind up meats and nuts for protein Limit fluids to 4 ounces/meal, and based on MD's orders.   Make sure there is protein in the form of above, ground nuts, peas, .  Samples given of Glucerna with discussion of how much he can eat/drink in place of meal.  Also sample of low carb/high protein Ensure 8 oundes given.  Need to monitor blood sugar readings and review wheat Dr. Trixie said to do at last visit.  Possibility he may not need insulin  right away,or blood sugars can be very high.   Limit alcohol- patient does not drink Be aware of lactose intolerance.  Pt. Not drinking milk-almond milk now.  Reminded of other foods containing lactose-puddings, recipes given by family members.

## 2023-04-19 NOTE — Progress Notes (Signed)
 Patient is here today with his wife to discuss his diet post surgery next week.  He will be having a whipple procedure.  Discussed first the fact that everyone's diet will be different based on his capacity to eat, how much of his pancrease will be removed and his activity level. General guidelines for this procedure: Smaller, more frequent meals, discussed 200-300 calories, 5-6X/day.   Eat slowly, and chew food thoroughly and/or may need to grind up meats and nuts for protein Limit fluids to 4 ounces/meal, and based on MD's orders.   Make sure there is protein in the form of above, ground nuts, peas, .  Samples given of Glucerna with discussion of how much he can eat/drink in place of meal.  Also sample of low carb/high protein Ensure 8 oundes given.  Need to monitor blood sugar readings and review wheat Dr. Trixie said to do at last visit.  Possibility he may not need insulin  right away,or blood sugars can be very high.   Limit alcohol- patient does not drink Be aware of lactose intolerance.  Pt. Not drinking milk-almond milk now.  Reminded of other foods containing lactose-puddings, recipes given by family members.

## 2023-04-20 NOTE — Progress Notes (Signed)
 Surgical Instructions   Your procedure is scheduled on Thursday, 04/27/23. Report to [facility] Main Entrance A at 7:00 A.M., then check in with the Admitting office. Any questions or running late day of surgery: call [phone]  Questions prior to your surgery date: call [phone], Monday-Friday, 8am-4pm. If you experience any cold or flu symptoms such as cough, fever, chills, shortness of breath, etc. between now and your scheduled surgery, please notify us  at the above number.     Remember:  Do not eat after midnight the night before your surgery  You may drink clear liquids until 6:00am the morning of your surgery.   Clear liquids allowed are: Water , Non-Citrus Juices (without pulp), Carbonated Beverages, Clear Tea (no milk, honey, etc.), Black Coffee Only (NO MILK, CREAM OR POWDERED CREAMER of any kind), and Gatorade.    Take these medicines the morning of surgery with A SIP OF WATER   alfuzosin  (UROXATRAL )  FLUoxetine  (PROZAC )   May take these medicines IF NEEDED:    One week prior to surgery, STOP taking any Aspirin  (unless otherwise instructed by your surgeon) Aleve, Naproxen, Ibuprofen , Motrin , Advil , Goody's, BC's, all herbal medications, fish oil, and non-prescription vitamins.  WHAT DO I DO ABOUT MY DIABETES MEDICATION?   Do not take oral diabetes medicines (pills) the morning of surgery.  Hold empagliflozin  (JARDIANCE ) 72 hours prior to surgery. Do not take a dose          after 04/23/23.  THE NIGHT BEFORE SURGERY, take 5 units of insulin  glargine (LANTUS  SOLOSTAR).   THE MORNING OF SURGERY, do not take metFORMIN  (GLUCOPHAGE ).   The day of surgery, do not take other diabetes injectables, including Byetta (exenatide), Bydureon (exenatide ER), Victoza (liraglutide), or Trulicity (dulaglutide).  If your CBG is greater than 220 mg/dL, you may take  of your sliding scale (Insulin  Lispro-aabc (LYUMJEV  KWIKPEN) ) dose of insulin .   HOW TO MANAGE YOUR  DIABETES BEFORE AND AFTER SURGERY  Why is it important to control my blood sugar before and after surgery? Improving blood sugar levels before and after surgery helps healing and can limit problems. A way of improving blood sugar control is eating a healthy diet by:  Eating less sugar and carbohydrates  Increasing activity/exercise  Talking with your doctor about reaching your blood sugar goals High blood sugars (greater than 180 mg/dL) can raise your risk of infections and slow your recovery, so you will need to focus on controlling your diabetes during the weeks before surgery. Make sure that the doctor who takes care of your diabetes knows about your planned surgery including the date and location.  How do I manage my blood sugar before surgery? Check your blood sugar at least 4 times a day, starting 2 days before surgery, to make sure that the level is not too high or low.  Check your blood sugar the morning of your surgery when you wake up and every 2 hours until you get to the Short Stay unit.  If your blood sugar is less than 70 mg/dL, you will need to treat for low blood sugar: Do not take insulin . Treat a low blood sugar (less than 70 mg/dL) with  cup of clear juice (cranberry or apple), 4 glucose tablets, OR glucose gel. Recheck blood sugar in 15 minutes after treatment (to make sure it is greater than 70 mg/dL). If your blood sugar is not greater than 70 mg/dL on recheck, call [phone] for further instructions. Report your blood sugar to the short stay  nurse when you get to Short Stay.  If you are admitted to the hospital after surgery: Your blood sugar will be checked by the staff and you will probably be given insulin  after surgery (instead of oral diabetes medicines) to make sure you have good blood sugar levels. The goal for blood sugar control after surgery is 80-180 mg/dL.                         Do NOT Smoke (Tobacco/Vaping) for 24 hours prior to your  procedure.  If you use a CPAP at night, you may bring your mask/headgear for your overnight stay.   You will be asked to remove any contacts, glasses, piercing's, hearing aid's, dentures/partials prior to surgery. Please bring cases for these items if needed.    Patients discharged the day of surgery will not be allowed to drive home, and someone needs to stay with them for 24 hours.  SURGICAL WAITING ROOM VISITATION Patients may have no more than 2 support people in the waiting area - these visitors may rotate.   Pre-op nurse will coordinate an appropriate time for 1 ADULT support person, who may not rotate, to accompany patient in pre-op.  Children under the age of 33 must have an adult with them who is not the patient and must remain in the main waiting area with an adult.  If the patient needs to stay at the hospital during part of their recovery, the visitor guidelines for inpatient rooms apply.  Please refer to the [facility] website for the visitor guidelines for any additional information.   If you received a COVID test during your pre-op visit  it is requested that you wear a mask when out in public, stay away from anyone that may not be feeling well and notify your surgeon if you develop symptoms. If you have been in contact with anyone that has tested positive in the last 10 days please notify you surgeon.      Pre-operative 5 CHG Bathing Instructions   You can play a key role in reducing the risk of infection after surgery. Your skin needs to be as free of germs as possible. You can reduce the number of germs on your skin by washing with CHG (chlorhexidine  gluconate) soap before surgery. CHG is an antiseptic soap that kills germs and continues to kill germs even after washing.   DO NOT use if you have an allergy to chlorhexidine /CHG or antibacterial soaps. If your skin becomes reddened or irritated, stop using the CHG and notify one of our RNs at [phone].   Please  shower with the CHG soap starting 4 days before surgery using the following schedule:     Please keep in mind the following:  DO NOT shave, including legs and underarms, starting the day of your first shower.   You may shave your face at any point before/day of surgery.  Place clean sheets on your bed the day you start using CHG soap. Use a clean washcloth (not used since being washed) for each shower. DO NOT sleep with pets once you start using the CHG.   CHG Shower Instructions:  Wash your face and private area with normal soap. If you choose to wash your hair, wash first with your normal shampoo.  After you use shampoo/soap, rinse your hair and body thoroughly to remove shampoo/soap residue.  Turn the water  OFF and apply about 3 tablespoons (45 ml) of CHG soap to  a Animal Nutritionist.  Apply CHG soap ONLY FROM YOUR NECK DOWN TO YOUR TOES (washing for 3-5 minutes)  DO NOT use CHG soap on face, private areas, open wounds, or sores.  Pay special attention to the area where your surgery is being performed.  If you are having back surgery, having someone wash your back for you may be helpful. Wait 2 minutes after CHG soap is applied, then you may rinse off the CHG soap.  Pat dry with a clean towel  Put on clean clothes/pajamas   If you choose to wear lotion, please use ONLY the CHG-compatible lotions that are listed below.  Additional instructions for the day of surgery: DO NOT APPLY any lotions, deodorants, cologne, or perfumes.   Do not bring valuables to the hospital. [facility] is not responsible for any belongings/valuables. Do not wear nail polish, gel polish, artificial nails, or any other type of covering on natural nails (fingers and toes) Do not wear jewelry or makeup Put on clean/comfortable clothes.  Please brush your teeth.  Ask your nurse before applying any prescription medications to the skin.     CHG Compatible Lotions   Aveeno Moisturizing lotion  Cetaphil  Moisturizing Cream  Cetaphil Moisturizing Lotion  Clairol Herbal Essence Moisturizing Lotion, Dry Skin  Clairol Herbal Essence Moisturizing Lotion, Extra Dry Skin  Clairol Herbal Essence Moisturizing Lotion, Normal Skin  Curel Age Defying Therapeutic Moisturizing Lotion with Alpha Hydroxy  Curel Extreme Care Body Lotion  Curel Soothing Hands Moisturizing Hand Lotion  Curel Therapeutic Moisturizing Cream, Fragrance-Free  Curel Therapeutic Moisturizing Lotion, Fragrance-Free  Curel Therapeutic Moisturizing Lotion, Original Formula  Eucerin Daily Replenishing Lotion  Eucerin Dry Skin Therapy Plus Alpha Hydroxy Crme  Eucerin Dry Skin Therapy Plus Alpha Hydroxy Lotion  Eucerin Original Crme  Eucerin Original Lotion  Eucerin Plus Crme Eucerin Plus Lotion  Eucerin TriLipid Replenishing Lotion  Keri Anti-Bacterial Hand Lotion  Keri Deep Conditioning Original Lotion Dry Skin Formula Softly Scented  Keri Deep Conditioning Original Lotion, Fragrance Free Sensitive Skin Formula  Keri Lotion Fast Absorbing Fragrance Free Sensitive Skin Formula  Keri Lotion Fast Absorbing Softly Scented Dry Skin Formula  Keri Original Lotion  Keri Skin Renewal Lotion Keri Silky Smooth Lotion  Keri Silky Smooth Sensitive Skin Lotion  Nivea Body Creamy Conditioning Oil  Nivea Body Extra Enriched Lotion  Nivea Body Original Lotion  Nivea Body Sheer Moisturizing Lotion Nivea Crme  Nivea Skin Firming Lotion  NutraDerm 30 Skin Lotion  NutraDerm Skin Lotion  NutraDerm Therapeutic Skin Cream  NutraDerm Therapeutic Skin Lotion  ProShield Protective Hand Cream  Provon moisturizing lotion  Please read over the following fact sheets that you were given.

## 2023-04-21 ENCOUNTER — Encounter (HOSPITAL_COMMUNITY)
Admission: RE | Admit: 2023-04-21 | Discharge: 2023-04-21 | Disposition: A | Discharge status: Home / Self Care | Payer: Medicare Other | Source: Ambulatory Visit | Attending: General Surgery | Admitting: General Surgery

## 2023-04-21 ENCOUNTER — Encounter (HOSPITAL_COMMUNITY): Payer: Self-pay

## 2023-04-21 ENCOUNTER — Other Ambulatory Visit (HOSPITAL_COMMUNITY): Payer: Medicare Other

## 2023-04-21 ENCOUNTER — Other Ambulatory Visit: Payer: Self-pay

## 2023-04-21 VITALS — BP 131/70 | HR 82 | Temp 98.5°F | Resp 18 | Ht 71.0 in | Wt 178.3 lb

## 2023-04-21 DIAGNOSIS — Z01818 Encounter for other preprocedural examination: Secondary | ICD-10-CM

## 2023-04-21 DIAGNOSIS — Z01812 Encounter for preprocedural laboratory examination: Secondary | ICD-10-CM | POA: Insufficient documentation

## 2023-04-21 DIAGNOSIS — E1165 Type 2 diabetes mellitus with hyperglycemia: Secondary | ICD-10-CM | POA: Diagnosis not present

## 2023-04-21 HISTORY — DX: Benign neoplasm of duodenum: D13.2

## 2023-04-21 HISTORY — DX: Benign neoplasm of heart: D15.1

## 2023-04-21 HISTORY — DX: Disease of pancreas, unspecified: K86.9

## 2023-04-21 LAB — BASIC METABOLIC PANEL
Anion gap: 9 (ref 5–15)
BUN: 19 mg/dL (ref 8–23)
CO2: 22 mmol/L (ref 22–32)
Calcium: 9.8 mg/dL (ref 8.9–10.3)
Chloride: 106 mmol/L (ref 98–111)
Creatinine, Ser: 0.96 mg/dL (ref 0.61–1.24)
GFR, Estimated: >60 mL/min (ref >60)
Glucose, Bld: 143 mg/dL — ABNORMAL HIGH (ref 70–99)
Potassium: 4.6 mmol/L (ref 3.5–5.1)
Sodium: 137 mmol/L (ref 135–145)

## 2023-04-21 LAB — CBC
HCT: 45.9 % (ref 39.0–52.0)
Hemoglobin: 14.9 g/dL (ref 13.0–17.0)
MCH: 29.6 pg (ref 26.0–34.0)
MCHC: 32.5 g/dL (ref 30.0–36.0)
MCV: 91.3 fL (ref 80.0–100.0)
Platelets: 201 10*3/uL (ref 150–400)
RBC: 5.03 MIL/uL (ref 4.22–5.81)
RDW: 13.9 % (ref 11.5–15.5)
WBC: 7.4 10*3/uL (ref 4.0–10.5)
nRBC: 0 % (ref 0.0–0.2)

## 2023-04-21 LAB — HEMOGLOBIN A1C
Hgb A1c MFr Bld: 6.8 % — ABNORMAL HIGH (ref 4.8–5.6)
Mean Plasma Glucose: 148 mg/dL

## 2023-04-21 LAB — GLUCOSE, CAPILLARY: Glucose-Capillary: 139 mg/dL — ABNORMAL HIGH (ref 70–99)

## 2023-04-21 NOTE — Progress Notes (Signed)
 PCP - Dr. Arlean EMERSON carrel Cardiologist - Dr. Alveta has follow up in March 2025. States a new growth was found during his visit in October and that will work that up after his whipple procedure.   PPM/ICD - Denies Device Orders - n/a Rep Notified - n/a  Chest x-ray - 11/09/2022 EKG - 11/09/2022 Stress Test - Denies ECHO - 02/10/2023 Cardiac Cath - Denies  Sleep Study - Denies CPAP - n/a  Fasting Blood Sugar - 90 - 120's Checks Blood Sugar ___ times a day:  Dexcom, checks multiple times a day.  Last dose of GLP1 agonist-  Jardiance  last dose 04/23/2023. Hold until after procedure   Blood Thinner Instructions: Denies Aspirin  Instructions:Denies taking  ERAS Protcol - Yes, Clears until 0600  PRE-SURGERY Ensure or G2-  no drink  COVID TEST- No   Anesthesia review: Y, cardiac clearance, HTN, DM,   Patient denies shortness of breath, fever, cough and chest pain at PAT appointment. Patient denies any respiratory issues at this time.    All instructions explained to the patient, with a verbal understanding of the material. Patient agrees to go over the instructions while at home for a better understanding. Patient also instructed to self quarantine after being tested for COVID-19. The opportunity to ask questions was provided.

## 2023-04-24 ENCOUNTER — Other Ambulatory Visit: Payer: Self-pay | Admitting: General Surgery

## 2023-04-24 DIAGNOSIS — D132 Benign neoplasm of duodenum: Secondary | ICD-10-CM

## 2023-04-25 ENCOUNTER — Encounter (HOSPITAL_COMMUNITY): Payer: Self-pay

## 2023-04-25 DIAGNOSIS — F3341 Major depressive disorder, recurrent, in partial remission: Secondary | ICD-10-CM | POA: Diagnosis not present

## 2023-04-25 NOTE — Progress Notes (Signed)
 Anesthesia Chart Review:  Case: [id number] Date/Time: 04/27/23 0845   Procedure: WHIPPLE PROCEDURE   Anesthesia type: General   Pre-op diagnosis:      DUODENAL ADNOMA     PANCREATRIC CYSTIC LESION   Location: [facility] ROOM 02 / [facility]   Surgeons: Aron Shoulders, MD       DISCUSSION: Patient is a 73 year old male scheduled for the above procedure. 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  He had a preoperative cardiology evaluation with Dr. Alveta on 01/20/23. No chest pain or limitations with activity. Preoperative echo recommended, otherwise as needed cardiology follow-up had been planned, but 02/10/23 echo showed normal LV systolic function, EF 55-60%, grade 1 diastolic dysfunction, trivial MR, mass in LA which appeared to be attached to the septum; probable myxoma. 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 Dr. Alveta reviewed and wrote, The myxoma is moderate in size but is not causing any hemodynamic issues. Atrial myxomas are benign ( not malignant) .   They need to be followed as they can cause obstruction to the mitral valve inflow .   He he is clear for his whipple procedure . We will address his atrial myxoma after his whipple procedure.     A1x 6.8%. He is on metformin , Jardiance , Lantus  10 units Q HS, Lyumjev  6 units with lung & dinner. Last Jardiance   04/23/23. He has a Dexcom G7 sensor.  Anesthesia team to evaluate on the day of surgery.    VS: BP 131/70   Pulse 82   Temp 36.9 C (Oral)   Resp 18   Ht 5' 11 (1.803 m)   Wt 80.9 kg   SpO2 99%   BMI 24.87 kg/m   PROVIDERS: Wendolyn Jenkins Jansky, MD is PCP Alveta Mungo, MD is cardiologist  Trixie File, MD is endocrinologist Mansouraty, Aloha, MD is GI    LABS: Labs reviewed: Acceptable for surgery. AST 31, ALT 41 on 02/09/23. (all labs ordered are listed, but only abnormal results are displayed)  Labs Reviewed  GLUCOSE, CAPILLARY - Abnormal; Notable for the following components:      Result Value   Glucose-Capillary 139 (*)    All other components within normal limits  HEMOGLOBIN A1C - Abnormal; Notable for the following components:   Hgb A1c MFr Bld 6.8 (*)    All other components within normal limits  BASIC METABOLIC PANEL - Abnormal; Notable for the following components:   Glucose, Bld 143 (*)    All other components within normal limits  CBC  TYPE AND SCREEN     IMAGES: CT Abd/pelvis 12/08/22: IMPRESSION: - Inflammatory stranding noted around the pancreatic body, predominantly anterior between the pancreas and stomach. This could reflect changes of acute pancreatitis or biopsy changes.  - Continued ductal dilatation and calcifications throughout the pancreas. Multiple hypoattenuating lesions within the pancreas are unchanged. - Aortic atherosclerosis. - Scattered colonic diverticulosis. - Prostate enlargement.  CXR 11/09/22: FINDINGS:  Bilateral lung fields are clear. Bilateral costophrenic angles are clear. Normal cardio-mediastinal silhouette. No acute osseous abnormalities. The soft tissues are within normal limits. IMPRESSION: No active cardiopulmonary disease.  EKG: 11/09/22: NSR   CV: MRI Cardiac 02/16/23: IMPRESSION: 1. 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  2. Normal biventricular chamber size and  function. LVEF 58%, RVEF 52%. No delayed myocardial enhancement.    Echo 02/10/23: IMPRESSIONS   1. 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   2. Left ventricular ejection fraction, by estimation, is 55 to 60%. The  left ventricle has normal function. The left ventricle has no regional  wall motion abnormalities. There is mild left ventricular hypertrophy of  the basal-septal segment. Left  ventricular diastolic parameters are consistent with Grade I diastolic  dysfunction (impaired relaxation).   3. Right ventricular systolic function is normal. The right ventricular  size is normal.   4. The mitral valve is normal in structure. Trivial mitral valve  regurgitation. No evidence of mitral stenosis.   5. The aortic valve is tricuspid. Aortic valve regurgitation is not  visualized. No aortic stenosis is present.   6. The inferior vena cava is normal in size with greater than 50%  respiratory variability, suggesting right atrial pressure of 3 mmHg.   Comparison(s): No prior Echocardiogram.   Past Medical History:  Diagnosis Date   Anxiety    Atrial myxoma    Basal cell carcinoma    Cancer (HCC)    basal cell skin CA   Cataract    Depression    Diabetes mellitus without complication (HCC)    Diabetic neuropathy (HCC)    Duodenal adenoma    Hyperlipidemia    Hypertension    IC (interstitial cystitis)    Neuromuscular disorder (HCC) 01/09/21   Peripheral  Neuropathy   Pancreatic lesion    Squamous cell carcinoma of skin     Past Surgical History:  Procedure Laterality Date   BIOPSY  12/05/2022   Procedure: BIOPSY;  Surgeon: Wilhelmenia Aloha Raddle., MD;  Location: [facility];  Service: Gastroenterology;;   CATARACT EXTRACTION, BILATERAL     ESOPHAGOGASTRODUODENOSCOPY N/A 12/05/2022   Procedure: ESOPHAGOGASTRODUODENOSCOPY (EGD);  Surgeon: Wilhelmenia Aloha Raddle., MD;  Location: [facility];  Service: Gastroenterology;   Laterality: N/A;   EUS N/A 12/05/2022   Procedure: UPPER ENDOSCOPIC ULTRASOUND (EUS) RADIAL;  Surgeon: Wilhelmenia Aloha Raddle., MD;  Location: [facility];  Service: Gastroenterology;  Laterality: N/A;   FINE NEEDLE ASPIRATION N/A 12/05/2022   Procedure: FINE NEEDLE ASPIRATION (FNA) LINEAR;  Surgeon: Wilhelmenia Aloha Raddle., MD;  Location: [facility];  Service: Gastroenterology;  Laterality: N/A;   HERNIA REPAIR N/A    inguinal   NECK SURGERY     ant/post   SPINE SURGERY     SUBMUCOSAL TATTOO INJECTION  12/05/2022   Procedure: SUBMUCOSAL TATTOO INJECTION;  Surgeon: Wilhelmenia Aloha Raddle., MD;  Location: [facility];  Service: Gastroenterology;;   TRANSURETHRAL RESECTION OF PROSTATE     TURP VAPORIZATION      MEDICATIONS:  alfuzosin  (UROXATRAL ) 10 MG 24 hr tablet   B-D UF III MINI PEN NEEDLES 31G X 5 MM MISC   clonazePAM  (KLONOPIN ) 0.5 MG tablet   Continuous Glucose Sensor (DEXCOM G7 SENSOR) MISC   empagliflozin  (JARDIANCE ) 25 MG TABS tablet   FLUoxetine  (PROZAC ) 20 MG capsule   glucose blood test strip   hydrOXYzine  (ATARAX /VISTARIL ) 50 MG tablet   ibuprofen  (ADVIL ,MOTRIN )  800 MG tablet   insulin  glargine (LANTUS  SOLOSTAR) 100 UNIT/ML Solostar Pen   Insulin  Lispro-aabc (LYUMJEV  KWIKPEN) 100 UNIT/ML KwikPen   Insulin  Pen Needle 32G X 4 MM MISC   ketoconazole  (NIZORAL ) 2 % shampoo   metFORMIN  (GLUCOPHAGE ) 1000 MG tablet   nystatin -triamcinolone  ointment (MYCOLOG)   polyethylene glycol (MIRALAX  / GLYCOLAX ) 17 g packet   rosuvastatin  (CRESTOR ) 20 MG tablet   No current facility-administered medications for this encounter.    Isaiah Ruder, PA-C Surgical Short Stay/Anesthesiology [facility] Phone [phone] [facility] Phone [phone] 04/25/2023 7:14 AM

## 2023-04-25 NOTE — Anesthesia Preprocedure Evaluation (Addendum)
 Anesthesia Evaluation  Patient identified by MRN, date of birth, ID band Patient awake    Reviewed: Allergy & Precautions, NPO status , Patient's Chart, lab work & pertinent test results  History of Anesthesia Complications Negative for: history of anesthetic complications  Airway Mallampati: III  TM Distance: >3 FB Neck ROM: Full    Dental  (+) Dental Advisory Given   Pulmonary neg shortness of breath, neg sleep apnea, neg COPD, neg recent URI, former smoker   Pulmonary exam normal breath sounds clear to auscultation       Cardiovascular Exercise Tolerance: Good hypertension, (-) angina (-) Past MI, (-) Cardiac Stents and (-) CABG  Rhythm:Regular Rate:Normal  HLD, left atrial myxoma  Cardiac MR 02/16/2023: IMPRESSION: 1. 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   2. Normal biventricular chamber size and function. LVEF 58%, RVEF 52%. No delayed myocardial enhancement.  TTE 02/10/2023: IMPRESSIONS    1. 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   2. Left ventricular ejection fraction, by estimation, is 55 to 60%. The  left ventricle has normal function. The left ventricle has no regional  wall motion abnormalities. There is mild left ventricular hypertrophy of  the basal-septal segment. Left  ventricular diastolic parameters are consistent with Grade I diastolic  dysfunction (impaired relaxation).   3. Right ventricular systolic function is normal. The right ventricular  size is normal.   4. The mitral valve is normal in structure. Trivial mitral valve  regurgitation. No evidence of mitral stenosis.   5. The aortic valve is tricuspid. Aortic valve regurgitation is not  visualized. No aortic stenosis is present.   6. The inferior vena cava is normal in size with greater than 50%  respiratory variability, suggesting right atrial pressure of 3  mmHg.     Neuro/Psych neg Seizures PSYCHIATRIC DISORDERS Anxiety Depression    S/p cervical fusion  Neuromuscular disease (peripheral neuropathy)    GI/Hepatic Neg liver ROS,neg GERD  ,,Duodenal adenoma, pancreatic lesion   Endo/Other  diabetes (Hgb A1c 6.8), Well Controlled, Type 2, Oral Hypoglycemic Agents, Insulin  Dependent    Renal/GU negative Renal ROS     Musculoskeletal   Abdominal   Peds  Hematology negative hematology ROS (+) Lab Results      Component                Value               Date                      WBC                      7.4                 04/21/2023                HGB                      14.9                04/21/2023                HCT                      45.9  04/21/2023                MCV                      91.3                04/21/2023                PLT                      201                 04/21/2023              Anesthesia Other Findings The patient reports having had a temporary spinal cord stimulator that has been removed. He does not currently have a spinal cord stimulator.  Reproductive/Obstetrics                             Anesthesia Physical Anesthesia Plan  ASA: 3  Anesthesia Plan: General   Post-op Pain Management: Epidural* and Tylenol  PO (pre-op)*   Induction: Intravenous  PONV Risk Score and Plan: 2 and Ondansetron , Dexamethasone  and Treatment may vary due to age or medical condition  Airway Management Planned: Oral ETT and Video Laryngoscope Planned  Additional Equipment: Arterial line  Intra-op Plan:   Post-operative Plan: Extubation in OR  Informed Consent: I have reviewed the patients History and Physical, chart, labs and discussed the procedure including the risks, benefits and alternatives for the proposed anesthesia with the patient or authorized representative who has indicated his/her understanding and acceptance.     Dental advisory given  Plan Discussed  with: CRNA and Anesthesiologist  Anesthesia Plan Comments: (PAT note written 04/25/2023 by Allison Zelenak, PA-C.  I have discussed risks of neuraxial anesthesia including but not limited to infection, bleeding, nerve injury, back pain, headache, seizures, and failure of block. Patient denies bleeding disorders and is not currently anticoagulated. Labs have been reviewed. Risks and benefits discussed. All patient's questions answered.   Risks of general anesthesia discussed including, but not limited to, sore throat, hoarse voice, chipped/damaged teeth, injury to vocal cords, nausea and vomiting, allergic reactions, lung infection, heart attack, stroke, and death. All questions answered.   )       Anesthesia Quick Evaluation

## 2023-04-26 NOTE — Progress Notes (Signed)
 Pt made aware of surgery time change for 04/27/23 0830-1430, arrival 0630, stopd drinking clear liquids by 0530, and to follow all other previous instructions given.

## 2023-04-26 NOTE — H&P (Signed)
 Chief Complaint  Patient presents with  Large Duodenal Adenoma   Referring MD: Mansouraty  HISTORY: Patient presents with a new diagnosis of duodenal adenoma and pancreatic cystic lesions. Patient had known pancreatic cysts and was referred for endoscopic ultrasound to better evaluate these. In performing EGD and endoscopic ultrasound he was found to have a circumferential duodenal adenoma at the junction of D2 and D3. The pancreas had multiple cysts in that, but the largest one was in the body and was approximately 2 to 3 cm. It appeared to be consistent with a sidebranch IPMN, but the fluid analysis was not back yet. The patient did develop post biopsy pancreatitis and lost around 10 pounds associated with the liquid diet from this. Patient denies abdominal pain. He denies any issues with diarrhea Stata Rea.  He is a diabetic and has been having trouble controlling it with oral medications. His doctor has suggested transitioning to insulin . He is inquiring about this.  His brother has a history of renal cancer.  Of note, the patient is concerned because following a neck surgery that he had he developed interstitial cystitis that lasted for around 10 months. The burning pain followed catheter removal. He is understandably anxious about this.  Imaging:  CT abd/pelvis 12/08/2022 IMPRESSION: Inflammatory stranding noted around the pancreatic body, predominantly anterior between the pancreas and stomach. This could reflect changes of acute pancreatitis or biopsy changes. Continued ductal dilatation and calcifications throughout the pancreas. Multiple hypoattenuating lesions within the pancreas are unchanged. Aortic atherosclerosis. Scattered colonic diverticulosis. Prostate enlargement.  MR 11/24/2022  IMPRESSION: 1. Numerous fluid signal cystic lesions throughout the pancreas are again seen, at most minimally enlarged compared to prior examination, largest in the inferior pancreatic  body measuring 1.7 x 1.1 cm. There are however numerous new fluid signal cystic lesions throughout as well as new dilatation of the distal pancreatic duct within the distal tail measuring up to 0.6 cm and substantial atrophy of the pancreatic parenchyma. 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 Consider EUS/FNA. 2. No acute inflammatory changes of the pancreas nor evidence of acute pancreatic fluid collection.  EUS: 12/05/22 Mansouraty Impression: EGD impression: - Tortuous esophagus. Z- line irregular, 39 cm from the incisors. - 1 cm hiatal hernia. - Gastritis. Biopsied. - Erythematous duodenopathy in the bulb. - Normal major papilla. - A large circumferential 7 to 8 cm duodenal polypoid lesion was noted in the D2/ D3 region. Biopsied. Tattooed proximal and distal to the lesion. - Normal mucosa was found in the visualized proximal jejunum.  EUS impression: - Pancreatic parenchymal abnormalities consisting of atrophy, hyperechoic foci, lobularity and cysts were noted in the entire pancreas. - The pancreatic duct was normal in appearance within the head/ neck region. Subsequent dilation noted with intraductal pancreatic stone in the neck/ body transition. Upstream dilation noted. Prominent sidebranches and tortuous/ ectatic appearance within the rest of the pancreas duct. - A cystic lesion was seen in the genu/ body of the pancreas just proximal to the pancreatic duct stone noted above. Cytology results are pending. However, the endosonographic appearance is suggestive of a branched intraductal papillary mucinous neoplasm. Fine needle aspiration for fluid performed. - A cystic lesion was seen in the pancreatic tail. Tissue has not been obtained. However, the endosonographic appearance is suggestive of a branched intraductal papillary mucinous neoplasm. - There was no  sign of significant pathology in the common bile duct  and in the common hepatic duct. The gallbladder was normal in appearance. - No malignant- appearing lymph nodes were visualized in the celiac region ( level 20) , peripancreatic region and porta hepatis region.  Past Medical History:  Diagnosis Date  Diabetes mellitus without complication (CMS/HHS-HCC)  Hyperlipidemia  Hypertension   Past Surgical History:  Procedure Laterality Date  UPPER ENDOSCOPIC ULTRASOUND (EUS) RADIAL ESOPHAGOGASTRODUODENOSCOPY (EGD) BIOPSY SUBMUCOSAL TATTOO INJECTION FINE NEEDLE ASPIRATION (FNA) LINEAR 12/05/2022  Dr. Brice Campi   No current outpatient medications on file.   No current facility-administered medications for this visit.   No Known Allergies  Family History  Problem Relation Name Age of Onset  Lung cancer Mother  Diabetes Father  Hyperlipidemia (Elevated cholesterol) Father  Coronary Artery Disease (Blocked arteries around heart) Father  Anxiety Brother  Depression Brother  Diabetes Brother  Kidney cancer Brother   Social History   Socioeconomic History  Marital status: Married  Tobacco Use  Smoking status: Former  Types: Cigarettes  Smokeless tobacco: Never  Substance and Sexual Activity  Alcohol use: Never  Drug use: Never   Social Determinants of Health   Financial Resource Strain: Low Risk (01/03/2023)  Received from [facility]  Overall Financial Resource Strain (CARDIA)  Difficulty of Paying Living Expenses: Not hard at all  Food Insecurity: No Food Insecurity (01/03/2023)  Received from [facility]  Hunger Vital Sign  Worried About Running Out of Food in the Last Year: Never true  Ran Out of Food in the Last Year: Never true  Transportation Needs: No Transportation Needs (01/03/2023)  Received from [facility] - [facility]  Lack of Transportation (Medical): No  Lack of Transportation (Non-Medical): No   REVIEW OF SYSTEMS - PERTINENT POSITIVES ONLY: A  complete review of systems negative other than HPI and PMH except for constipation, depression, anxiety.  EXAM: Vitals:  01/06/23 0901  BP: 110/64  Pulse: 103  Temp: 37.1 C (98.7 F)   Wt Readings from Last 3 Encounters:  01/06/23 82.9 kg (182 lb 12.8 oz)   Gen: No acute distress. Well nourished and well groomed.  Neurological: Alert and oriented to person, place, and time. Coordination normal.  Head: Normocephalic and atraumatic.  Eyes: Conjunctivae are normal. Pupils are equal, round, and reactive to light. No scleral icterus.  Neck: Normal range of motion. Neck supple. No tracheal deviation or thyromegaly present.  Cardiovascular: Normal rate, regular rhythm, normal heart sounds and intact distal pulses. Exam reveals no gallop and no friction rub. No murmur heard. Respiratory: Effort normal. No respiratory distress. No chest wall tenderness. Breath sounds normal. No wheezes, rales or rhonchi.  GI: Soft. Bowel sounds are normal. The abdomen is soft and nontender. There is no rebound and no guarding.  Musculoskeletal: Normal range of motion. Extremities are nontender.  Lymphadenopathy: No cervical, preauricular, postauricular or axillary adenopathy is present Skin: Skin is warm and dry. No rash noted. No diaphoresis. No erythema. No pallor. No clubbing, cyanosis, or edema.  Psychiatric: Normal mood and affect. Behavior is normal. Judgment and thought content normal.   LABORATORY RESULTS: Available labs are reviewed   Labs December 19, 2022 Hematocrit 38.6 Glucose 218 Albumin  3.4  RADIOLOGY RESULTS: See above.  ASSESSMENT AND PLAN: Duodenal adenoma (primary encounter diagnosis)  History of pancreatitis  IPMN (intraductal papillary mucinous neoplasm)  Interstitial cystitis  Patient will need a Whipple sure resection of the circumferential duodenal adenoma. The location is not possible to do a segmental resection. I discussed this with the patient and  his wife. The other  question is where to divide the pancreas. I think we will we will divide it slightly more left of the typical location in order to get the entirety of the pancreatic body cyst. I would plan to send this margin for frozen to see if there is any dysplasia present. I discussed that there is a rare circumstance where chasing the margin may require a total pancreatectomy. He does have other cysts that are present but these are smaller and do appear to be sidebranch IPMN's. His pancreatic ductal dilatation was consistent with being right after pancreatic duct stone rather than a intraductal lesion.  I discussed the surgery with the patient including diagrams of anatomy.   We discussed possible complications including: Potential of aborting procedure if tumor is invading the superior mesenteric or hepatic arteries Bleeding Infection and possible wound complications such as hernia Damage to adjacent structures Leak of anastamoses, primarily pancreatic Possible need for other procedures Possible prolonged nausea with possible need for external feeding.  Possible prolonged hospital stay. Possible development of diabetes or worsening of current diabetes.  Possible pancreatic exocrine insufficiency Prolonged fatigue/weakness/appetite Possible diagnosis of cancer  The patient understands and wishes to proceed.   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

## 2023-04-27 ENCOUNTER — Other Ambulatory Visit: Payer: Self-pay

## 2023-04-27 ENCOUNTER — Encounter (HOSPITAL_COMMUNITY): Payer: Self-pay | Admitting: General Surgery

## 2023-04-27 ENCOUNTER — Inpatient Hospital Stay (HOSPITAL_COMMUNITY)
Admission: RE | Admit: 2023-04-27 | Discharge: 2023-05-10 | DRG: 329 | Disposition: A | Discharge status: Home Health | Payer: Medicare Other | Attending: General Surgery | Admitting: General Surgery

## 2023-04-27 ENCOUNTER — Encounter (HOSPITAL_COMMUNITY)
Admission: RE | Disposition: A | Discharge status: Home Health | Payer: Self-pay | Source: Home / Self Care | Attending: General Surgery

## 2023-04-27 ENCOUNTER — Inpatient Hospital Stay (HOSPITAL_COMMUNITY): Payer: Medicare Other | Admitting: Anesthesiology

## 2023-04-27 ENCOUNTER — Inpatient Hospital Stay (HOSPITAL_COMMUNITY): Payer: Medicare Other | Admitting: Vascular Surgery

## 2023-04-27 DIAGNOSIS — Z87891 Personal history of nicotine dependence: Secondary | ICD-10-CM

## 2023-04-27 DIAGNOSIS — Z818 Family history of other mental and behavioral disorders: Secondary | ICD-10-CM | POA: Diagnosis not present

## 2023-04-27 DIAGNOSIS — Z801 Family history of malignant neoplasm of trachea, bronchus and lung: Secondary | ICD-10-CM

## 2023-04-27 DIAGNOSIS — N3010 Interstitial cystitis (chronic) without hematuria: Secondary | ICD-10-CM | POA: Diagnosis present

## 2023-04-27 DIAGNOSIS — E871 Hypo-osmolality and hyponatremia: Secondary | ICD-10-CM | POA: Diagnosis present

## 2023-04-27 DIAGNOSIS — E785 Hyperlipidemia, unspecified: Secondary | ICD-10-CM | POA: Diagnosis present

## 2023-04-27 DIAGNOSIS — D132 Benign neoplasm of duodenum: Secondary | ICD-10-CM

## 2023-04-27 DIAGNOSIS — K862 Cyst of pancreas: Secondary | ICD-10-CM | POA: Diagnosis not present

## 2023-04-27 DIAGNOSIS — D62 Acute posthemorrhagic anemia: Secondary | ICD-10-CM | POA: Diagnosis not present

## 2023-04-27 DIAGNOSIS — Z6824 Body mass index (BMI) 24.0-24.9, adult: Secondary | ICD-10-CM

## 2023-04-27 DIAGNOSIS — C170 Malignant neoplasm of duodenum: Secondary | ICD-10-CM | POA: Diagnosis not present

## 2023-04-27 DIAGNOSIS — B961 Klebsiella pneumoniae [K. pneumoniae] as the cause of diseases classified elsewhere: Secondary | ICD-10-CM | POA: Diagnosis not present

## 2023-04-27 DIAGNOSIS — D3A8 Other benign neuroendocrine tumors: Secondary | ICD-10-CM | POA: Diagnosis present

## 2023-04-27 DIAGNOSIS — D136 Benign neoplasm of pancreas: Secondary | ICD-10-CM | POA: Diagnosis not present

## 2023-04-27 DIAGNOSIS — I10 Essential (primary) hypertension: Secondary | ICD-10-CM | POA: Diagnosis not present

## 2023-04-27 DIAGNOSIS — C7A8 Other malignant neuroendocrine tumors: Secondary | ICD-10-CM | POA: Diagnosis not present

## 2023-04-27 DIAGNOSIS — E440 Moderate protein-calorie malnutrition: Secondary | ICD-10-CM | POA: Insufficient documentation

## 2023-04-27 DIAGNOSIS — Z01818 Encounter for other preprocedural examination: Secondary | ICD-10-CM

## 2023-04-27 DIAGNOSIS — Z794 Long term (current) use of insulin: Secondary | ICD-10-CM

## 2023-04-27 DIAGNOSIS — Z833 Family history of diabetes mellitus: Secondary | ICD-10-CM | POA: Diagnosis not present

## 2023-04-27 DIAGNOSIS — K8689 Other specified diseases of pancreas: Secondary | ICD-10-CM | POA: Diagnosis not present

## 2023-04-27 DIAGNOSIS — E43 Unspecified severe protein-calorie malnutrition: Secondary | ICD-10-CM | POA: Diagnosis not present

## 2023-04-27 DIAGNOSIS — E7800 Pure hypercholesterolemia, unspecified: Secondary | ICD-10-CM

## 2023-04-27 DIAGNOSIS — Z8249 Family history of ischemic heart disease and other diseases of the circulatory system: Secondary | ICD-10-CM

## 2023-04-27 DIAGNOSIS — E1165 Type 2 diabetes mellitus with hyperglycemia: Secondary | ICD-10-CM

## 2023-04-27 DIAGNOSIS — G8918 Other acute postprocedural pain: Secondary | ICD-10-CM | POA: Diagnosis not present

## 2023-04-27 DIAGNOSIS — E1140 Type 2 diabetes mellitus with diabetic neuropathy, unspecified: Secondary | ICD-10-CM | POA: Diagnosis present

## 2023-04-27 DIAGNOSIS — F419 Anxiety disorder, unspecified: Secondary | ICD-10-CM | POA: Diagnosis not present

## 2023-04-27 DIAGNOSIS — Z8051 Family history of malignant neoplasm of kidney: Secondary | ICD-10-CM

## 2023-04-27 DIAGNOSIS — E119 Type 2 diabetes mellitus without complications: Secondary | ICD-10-CM | POA: Diagnosis not present

## 2023-04-27 DIAGNOSIS — R197 Diarrhea, unspecified: Secondary | ICD-10-CM | POA: Diagnosis not present

## 2023-04-27 HISTORY — PX: WHIPPLE PROCEDURE: SHX2667

## 2023-04-27 LAB — POCT I-STAT 7, (LYTES, BLD GAS, ICA,H+H)
Acid-base deficit: 9 mmol/L — ABNORMAL HIGH (ref 0.0–2.0)
Acid-base deficit: 6 mmol/L — ABNORMAL HIGH (ref 0.0–2.0)
Acid-base deficit: 8 mmol/L — ABNORMAL HIGH (ref 0.0–2.0)
Acid-base deficit: 8 mmol/L — ABNORMAL HIGH (ref 0.0–2.0)
Bicarbonate: 17.4 mmol/L — ABNORMAL LOW (ref 20.0–28.0)
Bicarbonate: 20.2 mmol/L (ref 20.0–28.0)
Bicarbonate: 18.2 mmol/L — ABNORMAL LOW (ref 20.0–28.0)
Bicarbonate: 17.9 mmol/L — ABNORMAL LOW (ref 20.0–28.0)
Calcium, Ion: 1.26 mmol/L (ref 1.15–1.40)
Calcium, Ion: 1.32 mmol/L (ref 1.15–1.40)
Calcium, Ion: 1.3 mmol/L (ref 1.15–1.40)
Calcium, Ion: 1.26 mmol/L (ref 1.15–1.40)
HCT: 34 % — ABNORMAL LOW (ref 39.0–52.0)
HCT: 37 % — ABNORMAL LOW (ref 39.0–52.0)
HCT: 33 % — ABNORMAL LOW (ref 39.0–52.0)
HCT: 32 % — ABNORMAL LOW (ref 39.0–52.0)
Hemoglobin: 11.6 g/dL — ABNORMAL LOW (ref 13.0–17.0)
Hemoglobin: 12.6 g/dL — ABNORMAL LOW (ref 13.0–17.0)
Hemoglobin: 11.2 g/dL — ABNORMAL LOW (ref 13.0–17.0)
Hemoglobin: 10.9 g/dL — ABNORMAL LOW (ref 13.0–17.0)
O2 Saturation: 99 %
O2 Saturation: 98 %
O2 Saturation: 99 %
O2 Saturation: 99 %
Patient temperature: 35.6
Patient temperature: 36
Patient temperature: 37.1
Patient temperature: 37.6
Potassium: 3.5 mmol/L (ref 3.5–5.1)
Potassium: 4.2 mmol/L (ref 3.5–5.1)
Potassium: 4.3 mmol/L (ref 3.5–5.1)
Potassium: 4.4 mmol/L (ref 3.5–5.1)
Sodium: 139 mmol/L (ref 135–145)
Sodium: 137 mmol/L (ref 135–145)
Sodium: 137 mmol/L (ref 135–145)
Sodium: 138 mmol/L (ref 135–145)
TCO2: 18 mmol/L — ABNORMAL LOW (ref 22–32)
TCO2: 21 mmol/L — ABNORMAL LOW (ref 22–32)
TCO2: 19 mmol/L — ABNORMAL LOW (ref 22–32)
TCO2: 19 mmol/L — ABNORMAL LOW (ref 22–32)
pCO2 arterial: 34.3 mm[Hg] (ref 32–48)
pCO2 arterial: 40.4 mm[Hg] (ref 32–48)
pCO2 arterial: 37.2 mm[Hg] (ref 32–48)
pCO2 arterial: 39.7 mm[Hg] (ref 32–48)
pH, Arterial: 7.305 — ABNORMAL LOW (ref 7.35–7.45)
pH, Arterial: 7.301 — ABNORMAL LOW (ref 7.35–7.45)
pH, Arterial: 7.298 — ABNORMAL LOW (ref 7.35–7.45)
pH, Arterial: 7.264 — ABNORMAL LOW (ref 7.35–7.45)
pO2, Arterial: 134 mm[Hg] — ABNORMAL HIGH (ref 83–108)
pO2, Arterial: 119 mm[Hg] — ABNORMAL HIGH (ref 83–108)
pO2, Arterial: 142 mm[Hg] — ABNORMAL HIGH (ref 83–108)
pO2, Arterial: 142 mm[Hg] — ABNORMAL HIGH (ref 83–108)

## 2023-04-27 LAB — PREPARE RBC (CROSSMATCH)

## 2023-04-27 LAB — CBC WITH DIFFERENTIAL/PLATELET
Abs Immature Granulocytes: 0.03 10*3/uL (ref 0.00–0.07)
Basophils Absolute: 0.1 10*3/uL (ref 0.0–0.1)
Basophils Relative: 1 %
Eosinophils Absolute: 0.3 10*3/uL (ref 0.0–0.5)
Eosinophils Relative: 4 %
HCT: 47.4 % (ref 39.0–52.0)
Hemoglobin: 15.1 g/dL (ref 13.0–17.0)
Immature Granulocytes: 0 %
Lymphocytes Relative: 9 %
Lymphs Abs: 0.7 10*3/uL (ref 0.7–4.0)
MCH: 29.8 pg (ref 26.0–34.0)
MCHC: 31.9 g/dL (ref 30.0–36.0)
MCV: 93.7 fL (ref 80.0–100.0)
Monocytes Absolute: 0.6 10*3/uL (ref 0.1–1.0)
Monocytes Relative: 8 %
Neutro Abs: 6 10*3/uL (ref 1.7–7.7)
Neutrophils Relative %: 78 %
Platelets: 202 10*3/uL (ref 150–400)
RBC: 5.06 MIL/uL (ref 4.22–5.81)
RDW: 13.8 % (ref 11.5–15.5)
WBC: 7.7 10*3/uL (ref 4.0–10.5)
nRBC: 0 % (ref 0.0–0.2)

## 2023-04-27 LAB — COMPREHENSIVE METABOLIC PANEL
ALT: 55 U/L — ABNORMAL HIGH (ref 0–44)
AST: 41 U/L (ref 15–41)
Albumin: 3.4 g/dL — ABNORMAL LOW (ref 3.5–5.0)
Alkaline Phosphatase: 57 U/L (ref 38–126)
Anion gap: 10 (ref 5–15)
BUN: 13 mg/dL (ref 8–23)
CO2: 20 mmol/L — ABNORMAL LOW (ref 22–32)
Calcium: 9.6 mg/dL (ref 8.9–10.3)
Chloride: 107 mmol/L (ref 98–111)
Creatinine, Ser: 1.05 mg/dL (ref 0.61–1.24)
GFR, Estimated: >60 mL/min (ref >60)
Glucose, Bld: 126 mg/dL — ABNORMAL HIGH (ref 70–99)
Potassium: 4.4 mmol/L (ref 3.5–5.1)
Sodium: 137 mmol/L (ref 135–145)
Total Bilirubin: 1.2 mg/dL (ref 0.0–1.2)
Total Protein: 5.7 g/dL — ABNORMAL LOW (ref 6.5–8.1)

## 2023-04-27 LAB — CREATININE, SERUM
Creatinine, Ser: 1.12 mg/dL (ref 0.61–1.24)
GFR, Estimated: >60 mL/min (ref >60)

## 2023-04-27 LAB — CBC
HCT: 36 % — ABNORMAL LOW (ref 39.0–52.0)
Hemoglobin: 11.7 g/dL — ABNORMAL LOW (ref 13.0–17.0)
MCH: 29.9 pg (ref 26.0–34.0)
MCHC: 32.5 g/dL (ref 30.0–36.0)
MCV: 92.1 fL (ref 80.0–100.0)
Platelets: 215 10*3/uL (ref 150–400)
RBC: 3.91 MIL/uL — ABNORMAL LOW (ref 4.22–5.81)
RDW: 13.9 % (ref 11.5–15.5)
WBC: 12.6 10*3/uL — ABNORMAL HIGH (ref 4.0–10.5)
nRBC: 0 % (ref 0.0–0.2)

## 2023-04-27 LAB — GLUCOSE, CAPILLARY
Glucose-Capillary: 114 mg/dL — ABNORMAL HIGH (ref 70–99)
Glucose-Capillary: 160 mg/dL — ABNORMAL HIGH (ref 70–99)
Glucose-Capillary: 197 mg/dL — ABNORMAL HIGH (ref 70–99)
Glucose-Capillary: 207 mg/dL — ABNORMAL HIGH (ref 70–99)

## 2023-04-27 LAB — PROTIME-INR
INR: 1.1 (ref 0.8–1.2)
Prothrombin Time: 14.2 s (ref 11.4–15.2)

## 2023-04-27 LAB — POCT I-STAT, CHEM 8
BUN: 15 mg/dL (ref 8–23)
Calcium, Ion: 1.29 mmol/L (ref 1.15–1.40)
Chloride: 107 mmol/L (ref 98–111)
Creatinine, Ser: 0.8 mg/dL (ref 0.61–1.24)
Glucose, Bld: 185 mg/dL — ABNORMAL HIGH (ref 70–99)
HCT: 31 % — ABNORMAL LOW (ref 39.0–52.0)
Hemoglobin: 10.5 g/dL — ABNORMAL LOW (ref 13.0–17.0)
Potassium: 4.4 mmol/L (ref 3.5–5.1)
Sodium: 138 mmol/L (ref 135–145)
TCO2: 19 mmol/L — ABNORMAL LOW (ref 22–32)

## 2023-04-27 LAB — MRSA NEXT GEN BY PCR, NASAL: MRSA by PCR Next Gen: DETECTED — AB

## 2023-04-27 SURGERY — WHIPPLE PROCEDURE
Anesthesia: General

## 2023-04-27 MED ORDER — HYDROMORPHONE HCL 1 MG/ML IJ SOLN
INTRAMUSCULAR | Status: AC
  Filled 2023-04-27: qty 1

## 2023-04-27 MED ORDER — CHLORHEXIDINE GLUCONATE CLOTH 2 % EX PADS: 6.0000 | MEDICATED_PAD | Freq: Once | CUTANEOUS | Status: DC

## 2023-04-27 MED ORDER — SODIUM CHLORIDE 0.9% FLUSH: 9.0000 mL | INTRAVENOUS | Status: DC | PRN

## 2023-04-27 MED ORDER — DIAZEPAM 5 MG/ML IJ SOLN
2.5000 mg | Freq: Three times a day (TID) | INTRAMUSCULAR | Status: AC
  Administered 2023-04-27 – 2023-04-29 (×6): 2.5 mg via INTRAVENOUS
  Filled 2023-04-27 (×6): qty 2

## 2023-04-27 MED ORDER — DIPHENHYDRAMINE HCL 50 MG/ML IJ SOLN: 12.5000 mg | Freq: Four times a day (QID) | INTRAMUSCULAR | Status: DC | PRN

## 2023-04-27 MED ORDER — INSULIN GLARGINE-YFGN 100 UNIT/ML ~~LOC~~ SOLN
10.0000 [IU] | Freq: Every day | SUBCUTANEOUS | Status: DC
Start: 2023-04-27 — End: 2023-05-10
  Administered 2023-04-27 – 2023-05-09 (×13): 10 [IU] via SUBCUTANEOUS
  Filled 2023-04-27 (×16): qty 0.1

## 2023-04-27 MED ORDER — CEFAZOLIN SODIUM-DEXTROSE 2-4 GM/100ML-% IV SOLN
2.0000 g | INTRAVENOUS | Status: AC
  Administered 2023-04-27 (×2): 2 g via INTRAVENOUS
  Filled 2023-04-27: qty 100

## 2023-04-27 MED ORDER — FENTANYL CITRATE (PF) 250 MCG/5ML IJ SOLN
INTRAMUSCULAR | Status: DC | PRN
  Administered 2023-04-27 (×5): 50 ug via INTRAVENOUS

## 2023-04-27 MED ORDER — ORAL CARE MOUTH RINSE: 15.0000 mL | Freq: Once | OROMUCOSAL | Status: AC

## 2023-04-27 MED ORDER — ROPIVACAINE HCL 2 MG/ML IJ SOLN
INTRAMUSCULAR | Status: DC | PRN
  Administered 2023-04-27: 10 mL/h via EPIDURAL

## 2023-04-27 MED ORDER — HEPARIN SODIUM (PORCINE) 5000 UNIT/ML IJ SOLN
5000.0000 [IU] | Freq: Three times a day (TID) | INTRAMUSCULAR | Status: DC
  Administered 2023-04-27 – 2023-05-10 (×38): 5000 [IU] via SUBCUTANEOUS
  Filled 2023-04-27 (×38): qty 1

## 2023-04-27 MED ORDER — ONDANSETRON HCL 4 MG/2ML IJ SOLN
4.0000 mg | Freq: Four times a day (QID) | INTRAMUSCULAR | Status: DC | PRN
Start: 2023-04-27 — End: 2023-05-02
  Administered 2023-04-30: 4 mg via INTRAVENOUS
  Filled 2023-04-27: qty 2

## 2023-04-27 MED ORDER — WATER FOR IRRIGATION, STERILE IR SOLN
Status: DC | PRN
  Administered 2023-04-27: 1000 mL via SURGICAL_CAVITY

## 2023-04-27 MED ORDER — PHENOL 1.4 % MT LIQD
1.0000 | OROMUCOSAL | Status: DC | PRN
  Administered 2023-04-27 (×2): 1 via OROMUCOSAL
  Filled 2023-04-27: qty 177

## 2023-04-27 MED ORDER — PROPOFOL 10 MG/ML IV BOLUS
INTRAVENOUS | Status: AC
  Filled 2023-04-27: qty 20

## 2023-04-27 MED ORDER — CHLORHEXIDINE GLUCONATE 0.12 % MT SOLN
15.0000 mL | Freq: Once | OROMUCOSAL | Status: AC
  Administered 2023-04-27: 15 mL via OROMUCOSAL
  Filled 2023-04-27: qty 15

## 2023-04-27 MED ORDER — SUGAMMADEX SODIUM 200 MG/2ML IV SOLN
INTRAVENOUS | Status: DC | PRN
  Administered 2023-04-27: 161.4 mg via INTRAVENOUS

## 2023-04-27 MED ORDER — PANTOPRAZOLE SODIUM 40 MG IV SOLR
40.0000 mg | Freq: Every day | INTRAVENOUS | Status: DC
  Administered 2023-04-27 – 2023-04-30 (×4): 40 mg via INTRAVENOUS
  Filled 2023-04-27 (×4): qty 10

## 2023-04-27 MED ORDER — MUPIROCIN 2 % EX OINT
1.0000 | TOPICAL_OINTMENT | Freq: Two times a day (BID) | CUTANEOUS | Status: AC
  Administered 2023-04-27 – 2023-05-02 (×10): 1 via NASAL
  Filled 2023-04-27 (×2): qty 22

## 2023-04-27 MED ORDER — HYDROMORPHONE HCL 1 MG/ML IJ SOLN
0.2500 mg | INTRAMUSCULAR | Status: DC | PRN
  Administered 2023-04-27 (×2): 0.5 mg via INTRAVENOUS

## 2023-04-27 MED ORDER — DIPHENHYDRAMINE HCL 12.5 MG/5ML PO ELIX: 12.5000 mg | ORAL_SOLUTION | Freq: Four times a day (QID) | ORAL | Status: DC | PRN

## 2023-04-27 MED ORDER — ROPIVACAINE HCL 2 MG/ML IJ SOLN
8.0000 mL/h | INTRAMUSCULAR | Status: DC
  Administered 2023-04-28 – 2023-05-01 (×4): 8 mL/h via EPIDURAL
  Filled 2023-04-27: qty 200
  Filled 2023-04-27 (×2): qty 100
  Filled 2023-04-27 (×2): qty 200
  Filled 2023-04-27 (×2): qty 100
  Filled 2023-04-27: qty 200

## 2023-04-27 MED ORDER — INSULIN ASPART 100 UNIT/ML IJ SOLN
0.0000 [IU] | INTRAMUSCULAR | Status: DC
  Administered 2023-04-27 (×2): 3 [IU] via SUBCUTANEOUS
  Administered 2023-04-28: 5 [IU] via SUBCUTANEOUS
  Administered 2023-04-28 (×2): 3 [IU] via SUBCUTANEOUS
  Administered 2023-04-28 (×2): 5 [IU] via SUBCUTANEOUS
  Administered 2023-04-29: 2 [IU] via SUBCUTANEOUS
  Administered 2023-04-29 – 2023-04-30 (×4): 3 [IU] via SUBCUTANEOUS
  Administered 2023-04-30: 5 [IU] via SUBCUTANEOUS
  Administered 2023-05-01: 3 [IU] via SUBCUTANEOUS
  Administered 2023-05-01 (×3): 2 [IU] via SUBCUTANEOUS
  Administered 2023-05-01 (×2): 3 [IU] via SUBCUTANEOUS
  Administered 2023-05-01 – 2023-05-02 (×3): 2 [IU] via SUBCUTANEOUS
  Administered 2023-05-02: 3 [IU] via SUBCUTANEOUS
  Administered 2023-05-02 – 2023-05-03 (×3): 2 [IU] via SUBCUTANEOUS
  Administered 2023-05-03: 3 [IU] via SUBCUTANEOUS
  Administered 2023-05-03 (×3): 2 [IU] via SUBCUTANEOUS
  Administered 2023-05-03: 3 [IU] via SUBCUTANEOUS
  Administered 2023-05-04 (×4): 2 [IU] via SUBCUTANEOUS
  Administered 2023-05-04: 3 [IU] via SUBCUTANEOUS
  Administered 2023-05-05: 2 [IU] via SUBCUTANEOUS
  Administered 2023-05-05 (×2): 3 [IU] via SUBCUTANEOUS
  Administered 2023-05-05: 2 [IU] via SUBCUTANEOUS
  Administered 2023-05-05: 3 [IU] via SUBCUTANEOUS
  Administered 2023-05-05: 5 [IU] via SUBCUTANEOUS
  Administered 2023-05-06 – 2023-05-07 (×8): 3 [IU] via SUBCUTANEOUS
  Administered 2023-05-07 – 2023-05-09 (×7): 2 [IU] via SUBCUTANEOUS

## 2023-04-27 MED ORDER — 0.9 % SODIUM CHLORIDE (POUR BTL) OPTIME
TOPICAL | Status: DC | PRN
  Administered 2023-04-27: 1000 mL
  Administered 2023-04-27: 2000 mL

## 2023-04-27 MED ORDER — CEFAZOLIN SODIUM-DEXTROSE 2-4 GM/100ML-% IV SOLN
2.0000 g | Freq: Three times a day (TID) | INTRAVENOUS | Status: AC
  Administered 2023-04-27: 2 g via INTRAVENOUS
  Filled 2023-04-27: qty 100

## 2023-04-27 MED ORDER — PHENYLEPHRINE HCL-NACL 20-0.9 MG/250ML-% IV SOLN
INTRAVENOUS | Status: DC | PRN
  Administered 2023-04-27: 20 ug/min via INTRAVENOUS

## 2023-04-27 MED ORDER — MIDAZOLAM HCL 2 MG/2ML IJ SOLN
INTRAMUSCULAR | Status: AC
Start: 2023-04-27 — End: ?
  Filled 2023-04-27: qty 2

## 2023-04-27 MED ORDER — PROCHLORPERAZINE EDISYLATE 10 MG/2ML IJ SOLN
5.0000 mg | Freq: Four times a day (QID) | INTRAMUSCULAR | Status: DC | PRN
  Administered 2023-05-05 – 2023-05-07 (×2): 10 mg via INTRAVENOUS
  Filled 2023-04-27 (×2): qty 2

## 2023-04-27 MED ORDER — CHLORHEXIDINE GLUCONATE CLOTH 2 % EX PADS
6.0000 | MEDICATED_PAD | Freq: Every day | CUTANEOUS | Status: DC
  Administered 2023-04-27 – 2023-05-09 (×12): 6 via TOPICAL

## 2023-04-27 MED ORDER — LACTATED RINGERS IV SOLN: INTRAVENOUS | Status: DC

## 2023-04-27 MED ORDER — LIDOCAINE 2% (20 MG/ML) 5 ML SYRINGE
INTRAMUSCULAR | Status: DC | PRN
  Administered 2023-04-27: 80 mg via INTRAVENOUS

## 2023-04-27 MED ORDER — ALBUMIN HUMAN 5 % IV SOLN: INTRAVENOUS | Status: DC | PRN

## 2023-04-27 MED ORDER — MIDAZOLAM HCL 2 MG/2ML IJ SOLN
INTRAMUSCULAR | Status: DC | PRN
  Administered 2023-04-27: 2 mg via INTRAVENOUS

## 2023-04-27 MED ORDER — HYDROMORPHONE 1 MG/ML IV SOLN
INTRAVENOUS | Status: DC
Start: 2023-04-27 — End: 2023-05-02
  Administered 2023-04-28: 2.4 mg via INTRAVENOUS
  Administered 2023-04-28: 30 mg via INTRAVENOUS
  Administered 2023-04-28: 1.2 mg via INTRAVENOUS
  Administered 2023-04-29: 1.4 mg via INTRAVENOUS
  Administered 2023-04-29: 1.6 mg via INTRAVENOUS
  Administered 2023-04-30: 0.02 mg via INTRAVENOUS
  Administered 2023-04-30: 1 mg via INTRAVENOUS
  Administered 2023-05-01: 1.4 mg via INTRAVENOUS
  Administered 2023-05-01: 2 mg via INTRAVENOUS
  Administered 2023-05-02: 1.4 mg via INTRAVENOUS
  Filled 2023-04-27: qty 30

## 2023-04-27 MED ORDER — KCL IN DEXTROSE-NACL 20-5-0.45 MEQ/L-%-% IV SOLN
INTRAVENOUS | Status: AC
  Filled 2023-04-27 (×4): qty 1000

## 2023-04-27 MED ORDER — AMISULPRIDE (ANTIEMETIC) 5 MG/2ML IV SOLN: 10.0000 mg | Freq: Once | INTRAVENOUS | Status: DC | PRN

## 2023-04-27 MED ORDER — SODIUM CHLORIDE 0.9 % IV SOLN: INTRAVENOUS | Status: DC | PRN

## 2023-04-27 MED ORDER — ACETAMINOPHEN 500 MG PO TABS
1000.0000 mg | ORAL_TABLET | ORAL | Status: AC
  Administered 2023-04-27: 1000 mg via ORAL
  Filled 2023-04-27: qty 2

## 2023-04-27 MED ORDER — OXYCODONE HCL 5 MG PO TABS: 5.0000 mg | ORAL_TABLET | Freq: Once | ORAL | Status: DC | PRN

## 2023-04-27 MED ORDER — INSULIN GLARGINE-YFGN 100 UNIT/ML ~~LOC~~ SOLN
15.0000 [IU] | Freq: Every day | SUBCUTANEOUS | Status: DC
  Filled 2023-04-27: qty 0.15

## 2023-04-27 MED ORDER — OXYCODONE HCL 5 MG/5ML PO SOLN: 5.0000 mg | Freq: Once | ORAL | Status: DC | PRN

## 2023-04-27 MED ORDER — ROCURONIUM BROMIDE 10 MG/ML (PF) SYRINGE
PREFILLED_SYRINGE | INTRAVENOUS | Status: DC | PRN
  Administered 2023-04-27: 60 mg via INTRAVENOUS
  Administered 2023-04-27: 30 mg via INTRAVENOUS
  Administered 2023-04-27: 20 mg via INTRAVENOUS
  Administered 2023-04-27: 30 mg via INTRAVENOUS
  Administered 2023-04-27 (×2): 10 mg via INTRAVENOUS
  Administered 2023-04-27: 30 mg via INTRAVENOUS

## 2023-04-27 MED ORDER — ACETAMINOPHEN 10 MG/ML IV SOLN
1000.0000 mg | Freq: Four times a day (QID) | INTRAVENOUS | Status: AC
  Administered 2023-04-27 – 2023-04-28 (×4): 1000 mg via INTRAVENOUS
  Filled 2023-04-27 (×4): qty 100

## 2023-04-27 MED ORDER — PROCHLORPERAZINE MALEATE 10 MG PO TABS
10.0000 mg | ORAL_TABLET | Freq: Four times a day (QID) | ORAL | Status: DC | PRN
Start: 2023-04-27 — End: 2023-05-10

## 2023-04-27 MED ORDER — ONDANSETRON HCL 4 MG/2ML IJ SOLN
INTRAMUSCULAR | Status: DC | PRN
  Administered 2023-04-27: 4 mg via INTRAVENOUS

## 2023-04-27 MED ORDER — CHLORHEXIDINE GLUCONATE CLOTH 2 % EX PADS: 6.0000 | MEDICATED_PAD | Freq: Every day | CUTANEOUS | Status: DC

## 2023-04-27 MED ORDER — NALOXONE HCL 0.4 MG/ML IJ SOLN: 0.4000 mg | INTRAMUSCULAR | Status: DC | PRN

## 2023-04-27 MED ORDER — PHENYLEPHRINE 80 MCG/ML (10ML) SYRINGE FOR IV PUSH (FOR BLOOD PRESSURE SUPPORT)
PREFILLED_SYRINGE | INTRAVENOUS | Status: DC | PRN
  Administered 2023-04-27 (×7): 100 ug via INTRAVENOUS

## 2023-04-27 MED ORDER — PROPOFOL 10 MG/ML IV BOLUS
INTRAVENOUS | Status: DC | PRN
  Administered 2023-04-27: 50 mg via INTRAVENOUS
  Administered 2023-04-27: 150 mg via INTRAVENOUS

## 2023-04-27 MED ORDER — FENTANYL CITRATE (PF) 250 MCG/5ML IJ SOLN
INTRAMUSCULAR | Status: AC
  Filled 2023-04-27: qty 5

## 2023-04-27 MED ORDER — SODIUM CHLORIDE 0.9 % IV SOLN: 10.0000 mL/h | Freq: Once | INTRAVENOUS | Status: DC

## 2023-04-27 MED ORDER — BUPIVACAINE HCL (PF) 0.25 % IJ SOLN
INTRAMUSCULAR | Status: DC | PRN
  Administered 2023-04-27: 2.5 mL via EPIDURAL

## 2023-04-27 MED ORDER — DEXAMETHASONE SODIUM PHOSPHATE 10 MG/ML IJ SOLN
INTRAMUSCULAR | Status: DC | PRN
  Administered 2023-04-27: 5 mg via INTRAVENOUS

## 2023-04-27 MED ORDER — LIDOCAINE-EPINEPHRINE (PF) 1.5 %-1:200000 IJ SOLN
INTRAMUSCULAR | Status: DC | PRN
  Administered 2023-04-27: 2 mL via EPIDURAL
  Administered 2023-04-27: 3 mL via EPIDURAL

## 2023-04-27 MED ORDER — LACTATED RINGERS IV SOLN: INTRAVENOUS | Status: DC | PRN

## 2023-04-27 MED ORDER — VISTASEAL 10 ML SINGLE DOSE KIT
10.0000 mL | PACK | Freq: Once | CUTANEOUS | Status: AC
  Administered 2023-04-27: 10 mL via TOPICAL
  Filled 2023-04-27: qty 10

## 2023-04-27 SURGICAL SUPPLY — 104 items
BAG BILE T-TUBES STRL (MISCELLANEOUS) ×2 IMPLANT
BAG COUNTER SPONGE SURGICOUNT (BAG) ×1 IMPLANT
BIOPATCH RED 1 DISK 7.0 (GAUZE/BANDAGES/DRESSINGS) ×2 IMPLANT
BLADE CLIPPER SURG (BLADE) IMPLANT
BLADE SURG 10 STRL SS (BLADE) ×1 IMPLANT
CANISTER SUCT 3000ML PPV (MISCELLANEOUS) ×1 IMPLANT
CATH KIT ON-Q SILVERSOAK 7.5 (CATHETERS) IMPLANT
CATH KIT ON-Q SILVERSOAK 7.5IN (CATHETERS)
CATH ROBINSON RED A/P 16FR (CATHETERS) IMPLANT
CHLORAPREP W/TINT 26 (MISCELLANEOUS) ×1 IMPLANT
CLAMP SUTURE YELLOW 5 PAIRS (MISCELLANEOUS) ×2 IMPLANT
CLIP LIGATING HEM O LOK PURPLE (MISCELLANEOUS) ×2 IMPLANT
CLIP LIGATING HEMO O LOK GREEN (MISCELLANEOUS) ×1 IMPLANT
CLIP LIGATING HEMOLOK MED (MISCELLANEOUS) ×1 IMPLANT
CLIP TI LARGE 6 (CLIP) ×1 IMPLANT
CLIP TI MEDIUM 24 (CLIP) ×1 IMPLANT
CNTNR URN SCR LID CUP LEK RST (MISCELLANEOUS) IMPLANT
COUNTER NDL 20CT MAGNET RED (NEEDLE) IMPLANT
COVER MAYO STAND STRL (DRAPES) ×1 IMPLANT
COVER SURGICAL LIGHT HANDLE (MISCELLANEOUS) ×1 IMPLANT
DRAIN CHANNEL 19F RND (DRAIN) ×2 IMPLANT
DRAIN PENROSE 0.5X18 (DRAIN) IMPLANT
DRAPE LAPAROSCOPIC ABDOMINAL (DRAPES) IMPLANT
DRAPE WARM FLUID 44X44 (DRAPES) ×1 IMPLANT
DRSG COVADERM 4X10 (GAUZE/BANDAGES/DRESSINGS) IMPLANT
DRSG COVADERM 4X14 (GAUZE/BANDAGES/DRESSINGS) IMPLANT
DRSG COVADERM 4X6 (GAUZE/BANDAGES/DRESSINGS) IMPLANT
DRSG COVADERM 4X8 (GAUZE/BANDAGES/DRESSINGS) IMPLANT
DRSG COVADERM PLUS 2X2 (GAUZE/BANDAGES/DRESSINGS) ×1 IMPLANT
DRSG TEGADERM 4X4.75 (GAUZE/BANDAGES/DRESSINGS) ×1 IMPLANT
DRSG TELFA 3X8 NADH STRL (GAUZE/BANDAGES/DRESSINGS) IMPLANT
ELECT BLADE 4.0 EZ CLEAN MEGAD (MISCELLANEOUS)
ELECT BLADE 6.5 EXT (BLADE) ×1 IMPLANT
ELECT CAUTERY BLADE 6.4 (BLADE) ×1 IMPLANT
ELECT REM PT RETURN 9FT ADLT (ELECTROSURGICAL) ×1
ELECTRODE BLDE 4.0 EZ CLN MEGD (MISCELLANEOUS) IMPLANT
ELECTRODE REM PT RTRN 9FT ADLT (ELECTROSURGICAL) ×1 IMPLANT
GAUZE 4X4 16PLY ~~LOC~~+RFID DBL (SPONGE) IMPLANT
GAUZE SPONGE 4X4 12PLY STRL (GAUZE/BANDAGES/DRESSINGS) IMPLANT
GEL ULTRASOUND 20GR AQUASONIC (MISCELLANEOUS) IMPLANT
GLOVE BIO SURGEON STRL SZ 6 (GLOVE) ×2 IMPLANT
GLOVE INDICATOR 6.5 STRL GRN (GLOVE) ×2 IMPLANT
GOWN STRL REUS W/ TWL LRG LVL3 (GOWN DISPOSABLE) ×2 IMPLANT
GOWN STRL REUS W/ TWL XL LVL3 (GOWN DISPOSABLE) ×2 IMPLANT
HANDLE SUCTION POOLE (INSTRUMENTS) IMPLANT
HEMOSTAT SURGICEL 2X14 (HEMOSTASIS) IMPLANT
J-TUBE MIC 16FX51 UNV ENFIT (TUBING) ×1 IMPLANT
KIT BASIN OR (CUSTOM PROCEDURE TRAY) ×1 IMPLANT
KIT MARKER MARGIN INK (KITS) ×1 IMPLANT
KIT TURNOVER KIT B (KITS) ×1 IMPLANT
L-HOOK LAP DISP 36CM (ELECTROSURGICAL)
LHOOK LAP DISP 36CM (ELECTROSURGICAL) IMPLANT
LOOP VASCLR MAXI BLUE 18IN ST (MISCELLANEOUS) ×1 IMPLANT
NDL 22X1.5 STRL (OR ONLY) (MISCELLANEOUS) ×1 IMPLANT
NEEDLE 22X1.5 STRL (OR ONLY) (MISCELLANEOUS) ×1
NS IRRIG 1000ML POUR BTL (IV SOLUTION) ×2 IMPLANT
PACK GENERAL/GYN (CUSTOM PROCEDURE TRAY) ×1 IMPLANT
PAD ARMBOARD 7.5X6 YLW CONV (MISCELLANEOUS) ×2 IMPLANT
PENCIL SMOKE EVACUATOR (MISCELLANEOUS) ×1 IMPLANT
PLUG CATH AND CAP STRL 200 (CATHETERS) IMPLANT
RELOAD PROXIMATE 75MM BLUE (ENDOMECHANICALS) ×3
RELOAD PROXIMATE 75MM GREEN (ENDOMECHANICALS)
RELOAD STAPLE 75 3.8 BLU REG (ENDOMECHANICALS) IMPLANT
RELOAD STAPLE 75 4.5 GRN THCK (ENDOMECHANICALS) IMPLANT
SEPRAFILM PROCEDURAL PACK 3X5 (MISCELLANEOUS) IMPLANT
SHEARS FOC LG CVD HARMONIC 17C (MISCELLANEOUS) ×1 IMPLANT
SLEEVE SUCTION 125 (MISCELLANEOUS) IMPLANT
SLEEVE SUCTION CATH 165 (SLEEVE) ×1 IMPLANT
SPONGE INTESTINAL PEANUT (DISPOSABLE) IMPLANT
SPONGE SURGIFOAM ABS GEL 100 (HEMOSTASIS) IMPLANT
SPONGE T-LAP 18X18 ~~LOC~~+RFID (SPONGE) ×2 IMPLANT
STAPLER PROXIMATE 75MM BLUE (STAPLE) ×1 IMPLANT
STAPLER VISISTAT 35W (STAPLE) ×1 IMPLANT
SUCTION POOLE HANDLE (INSTRUMENTS)
SUT ETHILON 2 0 FS 18 (SUTURE) ×2 IMPLANT
SUT ETHILON 2 LR (SUTURE) IMPLANT
SUT PDS AB 1 TP1 96 (SUTURE) ×2 IMPLANT
SUT PDS AB 3-0 SH 27 (SUTURE) ×4 IMPLANT
SUT PDS AB 4-0 RB1 27 (SUTURE) ×11 IMPLANT
SUT PDS PLUS AB 5-0 RB-1 (SUTURE) ×5 IMPLANT
SUT PROLENE 3 0 SH 48 (SUTURE) ×3 IMPLANT
SUT PROLENE 4-0 RB1 .5 CRCL 36 (SUTURE) ×2 IMPLANT
SUT PROLENE 5 0 RB 1 DA (SUTURE) IMPLANT
SUT SILK 2 0 SH CR/8 (SUTURE) ×1 IMPLANT
SUT SILK 2 0 TIES 10X30 (SUTURE) ×1 IMPLANT
SUT SILK 3 0 SH CR/8 (SUTURE) ×1 IMPLANT
SUT SILK 3 0 TIES 10X30 (SUTURE) ×1 IMPLANT
SUT VIC AB 2-0 CT1 TAPERPNT 27 (SUTURE) IMPLANT
SUT VIC AB 2-0 SH 18 (SUTURE) IMPLANT
SUT VIC AB 3-0 SH 18 (SUTURE) IMPLANT
SUT VIC AB 3-0 SH 27X BRD (SUTURE) ×1 IMPLANT
SUT VICRYL AB 2 0 TIES (SUTURE) IMPLANT
TAG SUTURE CLAMP YLW 5PR (MISCELLANEOUS) ×2
TOWEL GREEN STERILE (TOWEL DISPOSABLE) ×1 IMPLANT
TOWEL GREEN STERILE FF (TOWEL DISPOSABLE) ×1 IMPLANT
TRAY FOLEY MTR SLVR 14FR STAT (SET/KITS/TRAYS/PACK) ×1 IMPLANT
TUBE CONNECTING 12X1/4 (SUCTIONS) ×1 IMPLANT
TUBE JEJUNAL 16FR ENFIT (TUBING)
TUBE NG 5FR 35IN ENFIT (TUBING) IMPLANT
TUBE PU 8FR 16IN ENFIT (TUBING) IMPLANT
TUNNELER SHEATH ON-Q 16GX12 DP (PAIN MANAGEMENT) IMPLANT
VASCULAR TIE MAXI BLUE 18IN ST (MISCELLANEOUS) ×1
VASCULAR TIE MINI RED 18IN STL (MISCELLANEOUS) ×1 IMPLANT
YANKAUER SUCT BULB TIP NO VENT (SUCTIONS) ×1 IMPLANT

## 2023-04-27 NOTE — Anesthesia Procedure Notes (Signed)
Arterial Line Insertion Start/End1/16/2025 7:30 AM, 04/27/2023 7:38 AM Performed by: Alease Medina, CRNA, CRNA  Patient location: Pre-op. Preanesthetic checklist: patient identified, IV checked, site marked, risks and benefits discussed, surgical consent, monitors and equipment checked, pre-op evaluation, timeout performed and anesthesia consent Lidocaine 1% used for infiltration Right, radial was placed Catheter size: 20 G Hand hygiene performed  and maximum sterile barriers used   Attempts: 1 Procedure performed without using ultrasound guided technique. Following insertion, dressing applied. Post procedure assessment: normal  Post procedure complications: local hematoma. Patient tolerated the procedure well with no immediate complications. Additional procedure comments: Preformed by A.Ernestina Patches, [place].

## 2023-04-27 NOTE — Transfer of Care (Addendum)
Immediate Anesthesia Transfer of Care Note  Patient: Andrew Tanner  Procedure(s) Performed: WHIPPLE PROCEDURE  Patient Location: PACU  Anesthesia Type:GA combined with regional for post-op pain  Level of Consciousness: oriented and drowsy  Airway & Oxygen Therapy: Patient Spontanous Breathing and Patient connected to face mask oxygen  Post-op Assessment: Report given to RN and Post -op Vital signs reviewed and stable moving all extremities  Post vital signs: Reviewed and stable  Last Vitals:  Vitals Value Taken Time  BP 133/78 04/27/23 1507  Temp 97.8   Pulse 88 04/27/23 1511  Resp 16 04/27/23 1511  SpO2 99 % 04/27/23 1511  Vitals shown include unfiled device data.  Last Pain:  Vitals:   04/27/23 0713  TempSrc:   PainSc: 0-No pain         Complications: No notable events documented.

## 2023-04-27 NOTE — Anesthesia Procedure Notes (Signed)
Procedure Name: Intubation Date/Time: 04/27/2023 9:00 AM  Performed by: Alease Medina, CRNAPre-anesthesia Checklist: Patient identified, Emergency Drugs available, Suction available and Patient being monitored Patient Re-evaluated:Patient Re-evaluated prior to induction Oxygen Delivery Method: Circle system utilized Preoxygenation: Pre-oxygenation with 100% oxygen Induction Type: IV induction Ventilation: Mask ventilation without difficulty Laryngoscope Size: Glidescope and 3 Grade View: Grade I Tube type: Oral Tube size: 7.5 mm Number of attempts: 1 Airway Equipment and Method: Stylet Placement Confirmation: ETT inserted through vocal cords under direct vision, positive ETCO2 and breath sounds checked- equal and bilateral Secured at: 21 cm Tube secured with: Tape Dental Injury: Teeth and Oropharynx as per pre-operative assessment

## 2023-04-27 NOTE — Anesthesia Procedure Notes (Signed)
Epidural Patient location during procedure: OB Start time: 04/27/2023 7:50 AM End time: 04/27/2023 7:58 AM  Staffing Anesthesiologist: [name], MD Performed: anesthesiologist   Preanesthetic Checklist Completed: patient identified, IV checked, site marked, risks and benefits discussed, surgical consent, monitors and equipment checked, pre-op evaluation and timeout performed  Epidural Patient position: sitting Prep: DuraPrep and site prepped and draped Patient monitoring: continuous pulse ox and blood pressure Approach: midline Location: thoracic (1-12) (T8-T9) Injection technique: LOR saline  Needle:  Needle type: Tuohy  Needle gauge: 17 G Needle length: 9 cm and 9 Needle insertion depth: 6 cm Catheter type: closed end flexible Catheter size: 19 Gauge Catheter at skin depth: 10 cm Test dose: negative and 1.5% lidocaine with Epi 1:200 K  Assessment Events: blood not aspirated, no cerebrospinal fluid, injection not painful, no injection resistance, no paresthesia and negative IV test  Additional Notes The patient has requested an epidural for labor pain management. Risks and benefits including, but not limited to, infection, bleeding, local anesthetic toxicity, headache, hypotension, back pain, block failure, etc. were discussed with the patient. The patient expressed understanding and consented to the procedure. I confirmed that the patient has no bleeding disorders and is not taking blood thinners. I confirmed the patient's last platelet count with the nurse. A time-out was performed immediately prior to the procedure. Please see nursing documentation for vital signs. Sterile technique was used throughout the whole procedure. Once LOR achieved, the epidural catheter threaded easily without resistance. Aspiration of the catheter was negative for blood and CSF. Epidural test dose was negative.  1 attempt(s)Reason for block:procedure for pain

## 2023-04-27 NOTE — Interval H&P Note (Signed)
History and Physical Interval Note:  04/27/2023 8:28 AM  Andrew Tanner  has presented today for surgery, with the diagnosis of DUODENAL ADNOMA PANCREATRIC CYSTIC LESION.  The various methods of treatment have been discussed with the patient and family. After consideration of risks, benefits and other options for treatment, the patient has consented to  Procedure(s): WHIPPLE PROCEDURE (N/A) as a surgical intervention.  The patient's history has been reviewed, patient examined, no change in status, stable for surgery.  I have reviewed the patient's chart and labs.  Questions were answered to the patient's satisfaction.     [name]

## 2023-04-27 NOTE — Discharge Instructions (Addendum)
[facility], [place] ?[phone] ? ?ABDOMINAL SURGERY: POST OP INSTRUCTIONS ? ?Always review your discharge instruction sheet given to you by the facility where your surgery was performed. ? ?IF YOU HAVE DISABILITY OR FAMILY LEAVE FORMS, YOU MUST BRING THEM TO THE OFFICE FOR PROCESSING.  PLEASE DO NOT GIVE THEM TO YOUR DOCTOR. ? ?A prescription for pain medication may be given to you upon discharge.  Take your pain medication as prescribed, if needed.  If narcotic pain medicine is not needed, then you may take acetaminophen (Tylenol) or ibuprofen (Advil) as needed. ?Take your usually prescribed medications unless otherwise directed. ?If you need a refill on your pain medication, please contact your pharmacy. They will contact our office to request authorization.  Prescriptions will not be filled after 5pm or on week-ends. ?You should follow a light diet the first few days after arrival home, such as soup and crackers, pudding, etc.unless your doctor has advised otherwise. A high-fiber, low fat diet can be resumed as tolerated.   Be sure to include lots of fluids daily. Most patients will experience some swelling and bruising on the chest and neck area.  Ice packs will help.  Swelling and bruising can take several days to resolve ?Most patients will experience some swelling and bruising in the area of the incision. Ice pack will help. Swelling and bruising can take several days to resolve.Marland Kitchen  ?It is common to experience some constipation if taking pain medication after surgery.  Increasing fluid intake and taking a stool softener will usually help or prevent this problem from occurring.  A mild laxative (Milk of Magnesia or Miralax) should be taken according to package directions if there are no bowel movements after 48 hours. ? You may have steri-strips (small skin tapes) in place directly over the incision.  These strips should be left on the skin for 10-14 days.  If your surgeon used skin glue on  the incision, you may shower in 48 hours.  The glue will flake off over the next 2-3 weeks.  Any sutures or staples will be removed at the office during your follow-up visit. You may find that a light gauze bandage over your incision may keep your staples from being rubbed or pulled. You may shower and replace the bandage daily. ?ACTIVITIES:  You may resume regular (light) daily activities beginning the next day--such as daily self-care, walking, climbing stairs--gradually increasing activities as tolerated.  You may have sexual intercourse when it is comfortable.  Refrain from any heavy lifting or straining until approved by your doctor. ?You may drive when you no longer are taking prescription pain medication, you can comfortably wear a seatbelt, and you can safely maneuver your car and apply brakes ?Return to Work: __________8 weeks if applicable_________________________ ?You should see your doctor in the office for a follow-up appointment approximately two weeks after your surgery.  Make sure that you call for this appointment within a day or two after you arrive home to insure a convenient appointment time. ?OTHER INSTRUCTIONS:  ?_____________________________________________________________ ?_____________________________________________________________ ? ?WHEN TO CALL YOUR DOCTOR: ?Fever over 101.0 ?Inability to urinate ?Nausea and/or vomiting ?Extreme swelling or bruising ?Continued bleeding from incision. ?Increased pain, redness, or drainage from the incision. ?Difficulty swallowing or breathing ?Muscle cramping or spasms. ?Numbness or tingling in hands or feet or around lips. ? ?The clinic staff is available to answer your questions during regular business hours.  Please don?t hesitate to call and ask to speak to  one of the nurses if you have concerns. ? ?For further questions, please visit [email or web address] ? ? ?

## 2023-04-27 NOTE — Op Note (Signed)
PREOPERATIVE DIAGNOSIS: duodenal adenoma  POSTOPERATIVE DIAGNOSIS: Same.   PROCEDURES PERFORMED:   Classic pancreaticoduodenectomy   Placement of pancreatic duct stent   SURGEON: [name], MD   ASSISTANT: Sophronia Simas, MD   ANESTHESIA: General and epidural   FINDINGS: long segment of duodenal disease. soft pancreatic tissue. 9 mm common bile duct. 3 mm pancreatic duct  SPECIMENS:  1. Pancreaticoduodenectomy with gallbladder:   ESTIMATED BLOOD LOSS: 600 mL.   COMPLICATIONS: None known.   PROCEDURE:   Pt was identified in the holding area and taken to  the operating room, and placed supine on the operating room  table. General anesthesia was induced. The patient's abdomen was  prepped and draped in a sterile fashion, after a Foley catheter was  placed. A time-out was performed according to the surgical safety check  list. When all was correct we continued.   A midline incision was made from the xiphoid to just above the umbilicus. The subcutaneous tissues were divided with the Bovie cautery. The peritoneum was entered in the center of the abdomen. Digital retraction was then used to elevate the preperitoneal fat, and  this was taken with the cautery as well. Care was taken to protect the underlying viscera.   The Bookwalter self-retaining retractor was placed for visualization. The right colon was taken down off of the white line of Toldt and from the retroperitoneum at the hepatic flexure. The porta was identified. The  duodenum was kocherized extensively with blunt dissection and with cautery. The gallbladder was taken off the liver with a combination of blunt dissection and cautery. The cystic duct was clipped with the Hemalock clips. The cystic duct was divided and the gallbladder was passed off.   The common bile duct was skeletonized near the duodenum. A vessel loop was passed around it. The gastroduodenal artery, as well as the common hepatic artery were skeletonized.  The proper hepatic artery was traced out to make sure that flow was going to both sides of the liver when the GDA was clamped. The GDA was test clamped with the bulldog, with good flow to the liver and  no signs of ischemia. This was divided with 2-0 silk ties and then clipped. The GDA did appear a bit small.  The proper hepatic artery was reflected upward, and the anterior portal vein was exposed.  A Kelly clamp was passed underneath the pancreas at the superior mesenteric vein, and this passed  easily with no signs of tumor involvement.   Attention was then directed to the stomach, and the omentum was taken  off of the stomach at the border of the antrum and the body. The  gastrohepatic ligament was taken down with the harmonic, and care was  taken to make sure there was not a replaced left hepatic artery in this  location. The stomach was divided with the GIA-75 stapler. The border  of the stomach was oversewn with a 3-0 running PDS suture.   Attention was then directed to the small bowel. Around 10 cm past the  ligament of Treitz was located, and this was divided with the 75-GIA.  The distal portion of the jejunum was also oversewn with a 3-0 PDS  suture. The fourth portion of the duodenum was skeletonized with the  harmonic scalpel, taking down all of the mesenteric vessels. The duodenum was very redundant in this region and was much longer than is typical.  The tattoos from the beginning and end of the tumor were seen in the  third portion of the duodenum.  The  ligament of Treitz was taken down. The IMV was preserved.  The duodenum was then passed underneath the portal vein.   At this point the Tresa Endo was replaced and the pancreas was divided with the cautery. 2-0 silk sutures were tied down and the inferior and superior border of the pancreas. The Bovie was used to coagulate the small bleeders at the border of the pancreas. There was an artery in the head of the pancreas that started  bleeding.  This required oversewing with 3-0 prolene.    The Overholt in combination with the harmonic and locking Weck clips were then used to take the uncinate process off of the portal vein and the superior mesenteric artery. As more dissection was performed, it became apparent that there was an accessory right hepatic vein posterior to the uncinat process.  The branch into the pancreatic head was divided off of this accessory artery.  Care was taken not to incorporate the superior mesenteric artery in the dissection. The specimen was then marked and passed off the table for frozen section margin.   The jejunum was then passed underneath the SMV, in order to get appropriate lie for the pancreatic and biliary anastomoses. The more distal portion of the jejunum was pulled up over  the colon, and two 3-0 silks were placed through the posterior border  of the stomach for the gastrojejunostomy. The stomach and the small  bowel were opened, and a GIA-75 was used to create an end-to-end  anastomosis. The open areas of the staple line were examined to ensure  that there was hemostasis. The defect was then closed with a single  layer of running Connell suture of 3-0 PDS. Prior to a complete  closure, the NG tube was passed toward the afferent limb.   The appropriate location for the choledochojejunostomy was identified, and  the small bowel was opened approximately 12 mm. The anastamosis was created with approximately 11-13 4-0 interrupted PDS sutures.   The 2 corner sutures were placed first  and then the posterior layer was done in an interrupted fashion tying on  the inside. The superior layer was then closed with interrupted sutures as  well.   At this point the frozens returned back as all negative. The posterior layer was formed first with 2-0 silk sutures in interrupted fashion.  The pancreas was soft, and the duct was 3 mm. Five 5-0 PDS sutures were used to create a duct-to-mucosa anastamosis.   A pediatric feeding tube was used as a pancreatic stent. The anterior layer was then oversewn with 2-0 silks to dunk the pancreatic parenchyma.   The areas were then irrigated and then those anastomoses were covered  with vistaseal. This was allowed to dry. The abdomen was then irrigated  again and all the laparotomy sponges were removed. A lap count was  performed, which was correct. Two 19-Blake drains were placed, with the  lateral-most drain placed behind the choledochojejunostomy. The medial  Blake drain was placed just anterior and slightly superior to the  pancreaticojejunostomy. The fascia was then closed with #1 looped running PDS sutures. The skin was irrigated and then closed with staples. The wounds were cleaned, dried and dressed with a sterile  dressing.   The patient tolerated the procedure well and was extubated and taken to  PACU in stable condition. Needle and sponge counts were correct x2.

## 2023-04-28 ENCOUNTER — Encounter (HOSPITAL_COMMUNITY): Payer: Self-pay | Admitting: General Surgery

## 2023-04-28 LAB — BASIC METABOLIC PANEL
Anion gap: 6 (ref 5–15)
BUN: 12 mg/dL (ref 8–23)
CO2: 20 mmol/L — ABNORMAL LOW (ref 22–32)
Calcium: 8.8 mg/dL — ABNORMAL LOW (ref 8.9–10.3)
Chloride: 107 mmol/L (ref 98–111)
Creatinine, Ser: 0.99 mg/dL (ref 0.61–1.24)
GFR, Estimated: >60 mL/min (ref >60)
Glucose, Bld: 182 mg/dL — ABNORMAL HIGH (ref 70–99)
Potassium: 3.9 mmol/L (ref 3.5–5.1)
Sodium: 133 mmol/L — ABNORMAL LOW (ref 135–145)

## 2023-04-28 LAB — GLUCOSE, CAPILLARY
Glucose-Capillary: 194 mg/dL — ABNORMAL HIGH (ref 70–99)
Glucose-Capillary: 187 mg/dL — ABNORMAL HIGH (ref 70–99)
Glucose-Capillary: 104 mg/dL — ABNORMAL HIGH (ref 70–99)

## 2023-04-28 LAB — MAGNESIUM: Magnesium: 1.9 mg/dL (ref 1.7–2.4)

## 2023-04-28 LAB — CBC
HCT: 34.2 % — ABNORMAL LOW (ref 39.0–52.0)
Hemoglobin: 11.4 g/dL — ABNORMAL LOW (ref 13.0–17.0)
MCH: 30.2 pg (ref 26.0–34.0)
MCHC: 33.3 g/dL (ref 30.0–36.0)
MCV: 90.5 fL (ref 80.0–100.0)
Platelets: 206 10*3/uL (ref 150–400)
RBC: 3.78 MIL/uL — ABNORMAL LOW (ref 4.22–5.81)
RDW: 14.2 % (ref 11.5–15.5)
WBC: 10.1 10*3/uL (ref 4.0–10.5)
nRBC: 0 % (ref 0.0–0.2)

## 2023-04-28 LAB — PROTIME-INR
INR: 1.4 — ABNORMAL HIGH (ref 0.8–1.2)
Prothrombin Time: 17.3 s — ABNORMAL HIGH (ref 11.4–15.2)

## 2023-04-28 LAB — PHOSPHORUS: Phosphorus: 1.9 mg/dL — ABNORMAL LOW (ref 2.5–4.6)

## 2023-04-28 MED ORDER — ORAL CARE MOUTH RINSE: 15.0000 mL | OROMUCOSAL | Status: DC | PRN

## 2023-04-28 MED ORDER — HYDROMORPHONE HCL 1 MG/ML IJ SOLN
1.0000 mg | Freq: Once | INTRAMUSCULAR | Status: AC
  Administered 2023-04-28: 1 mg via INTRAVENOUS
  Filled 2023-04-28: qty 1

## 2023-04-28 MED ORDER — KCL IN DEXTROSE-NACL 20-5-0.45 MEQ/L-%-% IV SOLN
INTRAVENOUS | Status: AC
  Filled 2023-04-28 (×2): qty 1000

## 2023-04-28 MED ORDER — SODIUM PHOSPHATES 45 MMOLE/15ML IV SOLN
30.0000 mmol | Freq: Once | INTRAVENOUS | Status: AC
  Administered 2023-04-28: 30 mmol via INTRAVENOUS
  Filled 2023-04-28: qty 10

## 2023-04-28 MED ORDER — HYDROMORPHONE HCL 1 MG/ML IJ SOLN
0.5000 mg | INTRAMUSCULAR | Status: DC | PRN
  Administered 2023-05-06 – 2023-05-07 (×3): 1 mg via INTRAVENOUS
  Filled 2023-04-28 (×3): qty 1

## 2023-04-28 NOTE — Progress Notes (Signed)
This RN and PT Lonia Skinner, PT) got the patient up out of bed together for the first time post-procedure. Patient was inattentive to both sides, lacking general awareness of direction, uncoordinated, and required extreme support from the physical therapist and myself. The patient remains alert and oriented x4, and his entire physical assessment while in bed remains unchanged. This RN paged Dr. Donell Beers with no response, eventually paged general surgery MD on call Dossie Der, MD) voicing these concerns. No new orders at this time, [place] to communicate concerns with [facility].   Beryl Meager, RN

## 2023-04-28 NOTE — Anesthesia Post-op Follow-up Note (Signed)
  Anesthesia Pain Follow-up Note  Patient: Andrew Tanner  Day #: 1  Date of Follow-up: 04/28/2023 Time: 10:18 AM  Last Vitals:  Vitals:   04/28/23 0833 04/28/23 0900  BP:  (!) 165/83  Pulse:  71  Resp: 14 16  Temp:    SpO2:  97%    Level of Consciousness: alert  Pain: none   Side Effects:None  Catheter Site Exam:clean, dry, no drainage  Anti-Coag Meds (From admission, onward)    Start     Dose/Rate Route Frequency Ordered Stop   04/27/23 1645  heparin injection 5,000 Units        5,000 Units Subcutaneous Every 8 hours 04/27/23 1559        Epidural / Intrathecal (From admission, onward)    Start     Dose/Rate Route Frequency Ordered Stop   04/27/23 1645  ropivacaine (PF) 2 mg/mL (0.2%) (NAROPIN) injection        8 mL/hr 8 mL/hr  Epidural Continuous 04/27/23 1559          Plan: Continue current therapy of postop epidural at surgeon's request  Kosse Nation

## 2023-04-28 NOTE — Anesthesia Postprocedure Evaluation (Signed)
Anesthesia Post Note  Patient: Andrew Tanner  Procedure(s) Performed: WHIPPLE PROCEDURE     Patient location during evaluation: PACU Anesthesia Type: General Level of consciousness: awake Pain management: pain level controlled Vital Signs Assessment: post-procedure vital signs reviewed and stable Respiratory status: spontaneous breathing, nonlabored ventilation and respiratory function stable Cardiovascular status: blood pressure returned to baseline and stable Postop Assessment: no apparent nausea or vomiting Anesthetic complications: no   No notable events documented.  Last Vitals:  Vitals:   04/28/23 1500 04/28/23 1554  BP: (!) 110/91   Pulse: 73   Resp: 13 13  Temp:    SpO2: 97% 95%    Last Pain:  Vitals:   04/28/23 1554  TempSrc:   PainSc: 3                  [name]

## 2023-04-28 NOTE — Evaluation (Signed)
Physical Therapy Evaluation Patient Details Name: Andrew Tanner MRN: [id number] DOB: [birth year] Today's Date: 04/28/2023  History of Present Illness  Pt is a 73 y/o male admitted 1/16 with new dx of duodenal adenoma and pancreatic cystic lesions for surgery.  S/P whipple procedure.  PMHx:  Basal cell carcinoma, , depression, DM, diabetic neuropathy, HTN, IC, Neuromuscular d/o/peripheral neuropathy  Clinical Impression  Pt admitted with/for the problem and surgical intervention related above.  On evaluation, this patient presented as if have sustained a neurological event with balance, coordination and strength limitations along with fatigue needing a heavy moderate assist and a second person for safety for all mobility and gait.  Pt currently limited functionally due to the problems listed. ( See problems list.)   Pt will benefit from PT to maximize function and safety in order to get ready for next venue listed below.  Patient will benefit from intensive inpatient follow up therapy, >3 hours/day.          If plan is discharge home, recommend the following: A lot of help with walking and/or transfers;A lot of help with bathing/dressing/bathroom;Assistance with cooking/housework;Assist for transportation   Can travel by private vehicle        Equipment Recommendations Other (comment) (TBD)  Recommendations for Other Services  Rehab consult    Functional Status Assessment Patient has had a recent decline in their functional status and demonstrates the ability to make significant improvements in function in a reasonable and predictable amount of time.     Precautions / Restrictions Precautions Precautions: Fall      Mobility  Bed Mobility Overal bed mobility: Needs Assistance Bed Mobility: Rolling, Sidelying to Sit, Sit to Sidelying Rolling: Mod assist Sidelying to sit: Mod assist       General bed mobility comments: Looking back, pt needed both VC and physical cuing to  execute what therapist was asking, multiple cues to get pt to kick feet off bed.  Mod cues and assist to get to EOB, with heavy list L and significnat UE asist to maintain balance.    Transfers Overall transfer level: Needs assistance   Transfers: Sit to/from Stand Sit to Stand: Min assist           General transfer comment: pt listing L in standing, Initially easy to help w/shift ot midline.  Worsening with fatigue.  Pt appears not to notice the L lean for the most part.    Ambulation/Gait Ambulation/Gait assistance: Mod assist, +2 safety/equipment Gait Distance (Feet): 50 Feet Assistive device: 1 person hand held assist, Rolling walker (2 wheels) Gait Pattern/deviations: Step-through pattern, Step-to pattern, Decreased step length - right, Decreased step length - left, Decreased stride length Gait velocity: slow Gait velocity interpretation: <1.31 ft/sec, indicative of household ambulator   General Gait Details: unsteady with heavy reliance on the RW, uncoordinated, low amplitude steps, frequently  Stairs            Wheelchair Mobility     Tilt Bed    Modified Rankin (Stroke Patients Only)       Balance Overall balance assessment: Needs assistance Sitting-balance support: Bilateral upper extremity supported, Single extremity supported, Feet supported Sitting balance-Leahy Scale: Poor Sitting balance - Comments: Left bias   Standing balance support: Single extremity supported, Bilateral upper extremity supported, During functional activity, Reliant on assistive device for balance Standing balance-Leahy Scale: Poor Standing balance comment: AD and external support needed for L bias and incoordination  Pertinent Vitals/Pain Pain Assessment Pain Assessment: Faces Faces Pain Scale: Hurts little more Pain Descriptors / Indicators: Discomfort Pain Intervention(s): PCA encouraged, Monitored during session    Home Living  Family/patient expects to be discharged to:: Private residence Living Arrangements: Spouse/significant other Available Help at Discharge: Family;Available 24 hours/day Type of Home: House Home Access: Stairs to enter     Alternate Level Stairs-Number of Steps: flight Home Layout: Two level;Bed/bath upstairs Home Equipment: Shower seat (walking sticks)      Prior Function Prior Level of Function : Independent/Modified Independent;Working/employed;Driving Surveyor, 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)                     Extremity/Trunk Assessment   Upper Extremity Assessment Upper Extremity Assessment: Overall WFL for tasks assessed    Lower Extremity Assessment Lower Extremity Assessment:  (shaky and mild incoordination when reaching which could be due in part due to didn't have glasses on.)       Communication   Communication Communication: No apparent difficulties Cueing Techniques: Verbal cues  Cognition Arousal: Alert Behavior During Therapy: Flat affect Overall Cognitive Status: No family/caregiver present to determine baseline cognitive functioning (pt did not acknowledge the heavy L lean sitting and standing with halted, uncoordinated steps bilateral with heavy use of L vs R hand on the RW when asked if he noticed.   "It's because I didn't know where I was going.")                                          General Comments General comments (skin integrity, edema, etc.): VSS    Exercises     Assessment/Plan    PT Assessment Patient needs continued PT services  PT Problem List Decreased activity tolerance;Decreased balance;Decreased mobility;Decreased coordination;Decreased cognition;Pain;Decreased knowledge of use of DME;Decreased strength       PT Treatment Interventions DME instruction;Gait training;Stair training;Functional mobility training;Therapeutic activities;Balance training;Patient/family education;Neuromuscular re-education    PT Goals  (Current goals can be found in the Care Plan section)  Acute Rehab PT Goals Patient Stated Goal: 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 PT Goal Formulation: With patient Time For Goal Achievement: 05/12/23 Potential to Achieve Goals: Good    Frequency Min 1X/week     Co-evaluation               AM-PAC PT "6 Clicks" Mobility  Outcome Measure Help needed turning from your back to your side while in a flat bed without using bedrails?: A Lot Help needed moving from lying on your back to sitting on the side of a flat bed without using bedrails?: A Lot Help needed moving to and from a bed to a chair (including a wheelchair)?: A Lot Help needed standing up from a chair using your arms (e.g., wheelchair or bedside chair)?: A Lot Help needed to walk in hospital room?: A Lot Help needed climbing 3-5 steps with a railing? : A Lot 6 Click Score: 12    End of Session Equipment Utilized During Treatment: Gait belt Activity Tolerance: Patient limited by fatigue Patient left: in bed;with call bell/phone within reach;with bed alarm set;with nursing/sitter in room Nurse Communication: Mobility status PT Visit Diagnosis: Unsteadiness on feet (R26.81);Other symptoms and signs involving the nervous system (R29.898);Other abnormalities of gait and mobility (R26.89)    Time: 5784-6962 PT Time Calculation (min) (ACUTE ONLY): 34 min   Charges:  PT Evaluation $PT Eval Moderate Complexity: 1 Mod PT Treatments $Gait Training: 8-22 mins PT General Charges $$ ACUTE PT VISIT: 1 Visit         04/28/2023  Jacinto Halim., PT Acute Rehabilitation Services [phone]  (office)  Eliseo Gum Yuuki Skeens 04/28/2023, 7:40 PM

## 2023-04-28 NOTE — Inpatient Diabetes Management (Signed)
Inpatient Diabetes Program Recommendations  AACE/ADA: New Consensus Statement on Inpatient Glycemic Control (2015)  Target Ranges:  Prepandial:   less than 140 mg/dL      Peak postprandial:   less than 180 mg/dL (1-2 hours)      Critically ill patients:  140 - 180 mg/dL   Lab Results  Component Value Date   GLUCAP 187 (H) 04/28/2023   HGBA1C 6.8 (H) 04/21/2023    Review of Glycemic Control  Diabetes history: DM2 Outpatient Diabetes medications: Lantus 10 units at HS, Lyumjev insulin 3-6 units scale per blood sugar at lunch and supper, Jardiance 25 mg daily, Metformin 1000 mg BID Current orders for Inpatient glycemic control: Semglee 10 units at HS, Novolog 0-15 units correction scale every 4 hours  Inpatient Diabetes Program Recommendations:   Spoke with patient at the bedside. States that he was diagnosed with diabetes about 13-14 years ago. His endocrinologist is Dr. Lafe Garin. States that he has a Dexcom G7 on left arm and is able to use for blood sugars while in the hospital. States that he does have a glucose meter and strips at home for backup.   Discussed his blood sugars since his surgery yesterday. Semglee 10 units was started last night. He was asking about the elevated blood sugars today. Discussed the fact that he was given Decadron yesterday during surgery and that it may take another 24 hours for the steroids to leave his system.  Will continue to monitor blood sugars while in the hospital. Will follow up with possible discharge recommendations when patient is closer to discharge. Agree with current insulin regimen at this time.   Smith Mince RN BSN CDE Diabetes Coordinator Pager: [phone]  8am-5pm

## 2023-04-28 NOTE — Progress Notes (Signed)
1 Day Post-Op   Subjective/Chief Complaint: Patient had a lot of pain this AM as his PCA was delayed.  No n/v.  Denies bladder spasm.    Objective: Vital signs in last 24 hours: Temp:  [97.8 F (36.6 C)-99.1 F (37.3 C)] 98.6 F (37 C) (01/17 1200) Pulse Rate:  [67-99] 70 (01/17 1300) Resp:  [9-17] 11 (01/17 1300) BP: (90-168)/(56-88) 154/88 (01/17 1300) SpO2:  [96 %-100 %] 97 % (01/17 1300) Arterial Line BP: (94-166)/(52-71) 112/52 (01/17 0000) Last BM Date :  (PTA)  Intake/Output from previous day: 01/16 0701 - 01/17 0700 In: 6268.5 [I.V.:4652.3; IV Piggyback:1592.2] Out: 2355 [Urine:1135; Drains:470; Blood:750] Intake/Output this shift: Total I/O In: 810.6 [I.V.:555.1; Other:48; IV Piggyback:207.5] Out: 610 [Urine:500; Drains:110]  General appearance: alert, cooperative, and no distress Resp: breathing comfortably Cardio: regular rate and rhythm GI: soft, non distended. Approp tender, dressing c/d/I.  Drains serosang Extremities: extremities normal, atraumatic, no cyanosis or edema  Lab Results:  Recent Labs    04/27/23 1702 04/28/23 0603  WBC 12.6* 10.1  HGB 11.7* 11.4*  HCT 36.0* 34.2*  PLT 215 206   BMET Recent Labs    04/27/23 0729 04/27/23 0945 04/27/23 1312 04/27/23 1424 04/27/23 1702 04/28/23 0603  NA 137   < > 138 138  --  133*  K 4.4   < > 4.4 4.4  --  3.9  CL 107  --  107  --   --  107  CO2 20*  --   --   --   --  20*  GLUCOSE 126*  --  185*  --   --  182*  BUN 13  --  15  --   --  12  CREATININE 1.05  --  0.80  --  1.12 0.99  CALCIUM 9.6  --   --   --   --  8.8*   < > = values in this interval not displayed.   PT/INR Recent Labs    04/27/23 0729 04/28/23 0603  LABPROT 14.2 17.3*  INR 1.1 1.4*   ABG Recent Labs    04/27/23 1228 04/27/23 1424  PHART 7.298* 7.264*  HCO3 18.2* 17.9*    Studies/Results: No results found.  Anti-infectives: Anti-infectives (From admission, onward)    Start     Dose/Rate Route Frequency  Ordered Stop   04/27/23 2100  ceFAZolin (ANCEF) IVPB 2g/100 mL premix        2 g 200 mL/hr over 30 Minutes Intravenous Every 8 hours 04/27/23 1559 04/27/23 2050   04/27/23 0700  ceFAZolin (ANCEF) IVPB 2g/100 mL premix        2 g 200 mL/hr over 30 Minutes Intravenous On call to O.R. 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  Hypophosphatemia - replete Hyponatremia - not clinically significant.  H/o interstitial cystitis Standing low dose valium for spasms DM- continuous glucose monitor and ssi. Standing long acting insulin as well. DM team to follow.   Ambulate today Incentive spirometry  Await final path.   Needs IV fluids today Anticipate d/c ngt tomorrow if low output and no n/v.     LOS: 1 day    [name] 04/28/2023

## 2023-04-28 NOTE — TOC Initial Note (Signed)
Transition of Care [facility]) - Initial/Assessment Note    Patient Details  Name: Andrew Tanner MRN: [id number] Date of Birth: Jul 18, 1950  [facility]) CM/SW Contact:    Lamonte Sakai, Student-Social Work Phone Number: 04/28/2023, 3:14 PM  Clinical Narrative:                 Pt admitted from home with spouse. No current TOC needs. Please consult as needs arise.        Patient Goals and CMS Choice            Expected Discharge Plan and Services       Living arrangements for the past 2 months: Hotel/Motel                                      Prior Living Arrangements/Services Living arrangements for the past 2 months: Hotel/Motel Lives with:: Spouse                   Activities of Daily Living   ADL Screening (condition at time of admission) Independently performs ADLs?: Yes (appropriate for developmental age) Is the patient deaf or have difficulty hearing?: No Does the patient have difficulty seeing, even when wearing glasses/contacts?: No Does the patient have difficulty concentrating, remembering, or making decisions?: No  Permission Sought/Granted                  Emotional Assessment       Orientation: : Oriented to Place, Oriented to  Time, Oriented to Situation, Oriented to Self      Admission diagnosis:  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] Patient Active Problem List   Diagnosis Date Noted   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   Poorly controlled type 2 diabetes mellitus with circulatory disorder (HCC) 02/08/2023   Vitamin D deficiency 07/25/2022   Pain in both feet 02/10/2022   Peripheral neuropathy 01/25/2022   Seborrheic dermatitis 01/25/2022   Positive colorectal cancer screening using Cologuard test 03/16/2020   Chronic RLQ pain 03/16/2020   Type 2 diabetes mellitus with hyperglycemia, without long-term current use of insulin (HCC) 06/02/2011   Hypertension  06/02/2011   Hyperlipemia 06/02/2011   Cataract 06/02/2011   PCP:  Jeani Sow, MD Pharmacy:   [facility] [address], [place] - [address] [place] [address] Phone: [phone] Fax: [phone]  [facility] [address] [place] [address] Phone: [phone] Fax: [phone]     Social Drivers of Health (SDOH) Social History: SDOH Screenings   Food Insecurity: No Food Insecurity (04/27/2023)  Housing: Low Risk  (04/27/2023)  Transportation Needs: No Transportation Needs (04/27/2023)  Utilities: Not At Risk (04/27/2023)  Alcohol Screen: Low Risk  (10/19/2020)  Depression (PHQ2-9): Low Risk  (03/20/2023)  Financial Resource Strain: Low Risk  (03/16/2023)  Physical Activity: Sufficiently Active (03/16/2023)  Social Connections: Socially Integrated (04/27/2023)  Stress: No Stress Concern Present (03/16/2023)  Tobacco Use: Medium Risk (04/27/2023)  Health Literacy: Adequate Health Literacy (01/03/2023)   SDOH Interventions:     Readmission Risk Interventions     No data to display

## 2023-04-28 NOTE — Progress Notes (Signed)
Verbal order from Trauma MD Janee Morn to remove a-line.   Care ongoing.    Hughie Closs, RN

## 2023-04-29 LAB — CBC
HCT: 34.7 % — ABNORMAL LOW (ref 39.0–52.0)
Hemoglobin: 11.6 g/dL — ABNORMAL LOW (ref 13.0–17.0)
MCH: 30.1 pg (ref 26.0–34.0)
MCHC: 33.4 g/dL (ref 30.0–36.0)
MCV: 90.1 fL (ref 80.0–100.0)
Platelets: 172 10*3/uL (ref 150–400)
RBC: 3.85 MIL/uL — ABNORMAL LOW (ref 4.22–5.81)
RDW: 14.1 % (ref 11.5–15.5)
WBC: 12.2 10*3/uL — ABNORMAL HIGH (ref 4.0–10.5)
nRBC: 0 % (ref 0.0–0.2)

## 2023-04-29 LAB — BASIC METABOLIC PANEL
Anion gap: 4 — ABNORMAL LOW (ref 5–15)
BUN: 6 mg/dL — ABNORMAL LOW (ref 8–23)
CO2: 22 mmol/L (ref 22–32)
Calcium: 8.8 mg/dL — ABNORMAL LOW (ref 8.9–10.3)
Chloride: 108 mmol/L (ref 98–111)
Creatinine, Ser: 0.8 mg/dL (ref 0.61–1.24)
GFR, Estimated: >60 mL/min (ref >60)
Glucose, Bld: 147 mg/dL — ABNORMAL HIGH (ref 70–99)
Potassium: 4.1 mmol/L (ref 3.5–5.1)
Sodium: 134 mmol/L — ABNORMAL LOW (ref 135–145)

## 2023-04-29 LAB — GLUCOSE, CAPILLARY
Glucose-Capillary: 112 mg/dL — ABNORMAL HIGH (ref 70–99)
Glucose-Capillary: 119 mg/dL — ABNORMAL HIGH (ref 70–99)
Glucose-Capillary: 132 mg/dL — ABNORMAL HIGH (ref 70–99)
Glucose-Capillary: 159 mg/dL — ABNORMAL HIGH (ref 70–99)
Glucose-Capillary: 87 mg/dL (ref 70–99)

## 2023-04-29 LAB — MAGNESIUM: Magnesium: 1.7 mg/dL (ref 1.7–2.4)

## 2023-04-29 LAB — PHOSPHORUS: Phosphorus: 1.6 mg/dL — ABNORMAL LOW (ref 2.5–4.6)

## 2023-04-29 MED ORDER — CLONAZEPAM 0.125 MG PO TBDP
0.5000 mg | ORAL_TABLET | Freq: Two times a day (BID) | ORAL | Status: DC
  Administered 2023-04-29 – 2023-05-01 (×3): 0.5 mg via ORAL
  Filled 2023-04-29 (×3): qty 4

## 2023-04-29 MED ORDER — SODIUM PHOSPHATES 45 MMOLE/15ML IV SOLN
30.0000 mmol | Freq: Once | INTRAVENOUS | Status: AC
  Administered 2023-04-29: 30 mmol via INTRAVENOUS
  Filled 2023-04-29: qty 10

## 2023-04-29 MED ORDER — ACETAMINOPHEN 325 MG PO TABS
650.0000 mg | ORAL_TABLET | Freq: Four times a day (QID) | ORAL | Status: DC
  Administered 2023-04-29 – 2023-05-10 (×41): 650 mg via ORAL
  Filled 2023-04-29 (×42): qty 2

## 2023-04-29 NOTE — Progress Notes (Signed)
Progress Note  Noted continued spatial awareness problem, inattention with L UE>L LE and overall incoordination of steps during the transfer.    04/29/23 1609  PT Visit Information  Last PT Received On 04/29/23  Assistance Needed +2  History of Present Illness Pt is a 73 y/o male admitted 1/16 with new dx of duodenal adenoma and pancreatic cystic lesions for surgery.  S/P whipple procedure.  PMHx:  Basal cell carcinoma, , depression, DM, diabetic neuropathy, HTN, IC, Neuromuscular d/o/peripheral neuropathy  Subjective Data  Patient Stated Goal 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  Precautions  Precautions Fall  Pain Assessment  Faces Pain Scale 6  Pain Descriptors / Indicators Discomfort  Pain Intervention(s) Monitored during session;Patient requesting pain meds-RN notified  Cognition  Arousal Alert  Behavior During Therapy Flat affect  Overall Cognitive Status  (NT formally)  General Comments pt is still having trouble cognitively with direction, spatial awareness,  not formally tested.  Bed Mobility  Overal bed mobility Needs Assistance  Bed Mobility Sit to Sidelying  Sit to sidelying Mod assist  General bed mobility comments Once sitting at EOB before transition to supine, pt fell posteriorly.  Transfers  Overall transfer level Needs assistance  Equipment used 1 person hand held assist  Transfers Sit to/from Stand;Bed to chair/wheelchair/BSC  Sit to Stand Min assist  Bed to/from chair/wheelchair/BSC transfer type: Step pivot  Step pivot transfers Mod assist  General transfer comment pt needed steadying assist overall, w/shift correction L to R and w/shift/stepping assist to return to bed.  moderate cues for direction/to help pt with his spatial awareness.  Balance  Sitting balance-Leahy Scale Poor  Standing balance-Leahy Scale Poor  PT - End of Session  Activity Tolerance Patient limited by fatigue  Patient left in bed;with nursing/sitter in room  Nurse  Communication Mobility status   PT - Assessment/Plan  PT Visit Diagnosis Unsteadiness on feet (R26.81);Other symptoms and signs involving the nervous system (R29.898);Other abnormalities of gait and mobility (R26.89)  PT Frequency (ACUTE ONLY) Min 1X/week  Recommendations for Other Services Rehab consult  Follow Up Recommendations Acute inpatient rehab (3hours/day)  Patient can return home with the following A lot of help with walking and/or transfers;A lot of help with bathing/dressing/bathroom;Assistance with cooking/housework;Assist for transportation  PT equipment Other (comment)  AM-PAC PT "6 Clicks" Mobility Outcome Measure (Version 2)  Help needed turning from your back to your side while in a flat bed without using bedrails? 2  Help needed moving from lying on your back to sitting on the side of a flat bed without using bedrails? 2  Help needed moving to and from a bed to a chair (including a wheelchair)? 2  Help needed standing up from a chair using your arms (e.g., wheelchair or bedside chair)? 2  Help needed to walk in hospital room? 1  Help needed climbing 3-5 steps with a railing?  1  6 Click Score 10  Consider Recommendation of Discharge To: CIR/SNF/LTACH  Progressive Mobility  What is the highest level of mobility based on the progressive mobility assessment? Level 3 (Stands with assist) - Balance while standing  and cannot march in place  Mobility Referral No  Activity Transferred from chair to bed  PT Goal Progression  Progress towards PT goals Progressing toward goals  Acute Rehab PT Goals  PT Goal Formulation With patient  Time For Goal Achievement 05/12/23  Potential to Achieve Goals Good  PT Time Calculation  PT Start Time (ACUTE ONLY) 1550  PT Stop Time (ACUTE ONLY) 1559  PT Time Calculation (min) (ACUTE ONLY) 9 min  PT General Charges  $$ ACUTE PT VISIT 1 Visit  PT Treatments  $Therapeutic Activity 8-22 mins    04/29/2023  Jacinto Halim., PT Acute Rehabilitation  Services [phone]  (office)

## 2023-04-29 NOTE — Progress Notes (Signed)
Inpatient Rehab Admissions Coordinator:   Per therapy recommendations,  patient was screened for CIR candidacy by Megan Salon, MS, CCC-SLP.   At this time, Pt. is not medically ready for CIR at this time. He currently is not tolerating an oral diet.. I will not pursue a rehab consult for this Pt. at this time, but CIR admissions team will follow and monitor for medical readiness and place consult order if Pt. appears to be an appropriate candidate. Please contact me with any questions.

## 2023-04-29 NOTE — Progress Notes (Signed)
2 Days Post-Op   Subjective/Chief Complaint: Uneventful night, pain controlled with PCA. NG came out overnight.    Objective: Vital signs in last 24 hours: Temp:  [98 F (36.7 C)-99 F (37.2 C)] 98.7 F (37.1 C) (01/18 0800) Pulse Rate:  [69-108] 98 (01/18 0900) Resp:  [11-21] 16 (01/18 0900) BP: (110-168)/(68-91) 122/68 (01/18 0900) SpO2:  [0 %-98 %] 96 % (01/18 0900) FiO2 (%):  [21 %] 21 % (01/18 0812) Last BM Date :  (PTA)  Intake/Output from previous day: 01/17 0701 - 01/18 0700 In: 2141.3 [I.V.:1626.6; IV Piggyback:330.7] Out: 2115 [Urine:1250; Emesis/NG output:175; Drains:690] Intake/Output this shift: Total I/O In: 246.9 [I.V.:222.9; Other:24] Out: -   General appearance: alert, cooperative, and no distress Resp: breathing comfortably Cardio: regular rate and rhythm GI: soft, mildly distended. Approp tender, dressing c/d/I.  Drains serosang Extremities: extremities normal, atraumatic, no cyanosis or edema  Lab Results:  Recent Labs    04/28/23 0603 04/29/23 0430  WBC 10.1 12.2*  HGB 11.4* 11.6*  HCT 34.2* 34.7*  PLT 206 172   BMET Recent Labs    04/28/23 0603 04/29/23 0430  NA 133* 134*  K 3.9 4.1  CL 107 108  CO2 20* 22  GLUCOSE 182* 147*  BUN 12 6*  CREATININE 0.99 0.80  CALCIUM 8.8* 8.8*   PT/INR Recent Labs    04/27/23 0729 04/28/23 0603  LABPROT 14.2 17.3*  INR 1.1 1.4*   ABG Recent Labs    04/27/23 1228 04/27/23 1424  PHART 7.298* 7.264*  HCO3 18.2* 17.9*    Studies/Results: No results found.  Anti-infectives: Anti-infectives (From admission, onward)    Start     Dose/Rate Route Frequency Ordered Stop   04/27/23 2100  ceFAZolin (ANCEF) IVPB 2g/100 mL premix        2 g 200 mL/hr over 30 Minutes Intravenous Every 8 hours 04/27/23 1559 04/27/23 2050   04/27/23 0700  ceFAZolin (ANCEF) IVPB 2g/100 mL premix        2 g 200 mL/hr over 30 Minutes Intravenous On call to O.R. 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  Hypophosphatemia - replete again Hyponatremia - not clinically significant.  H/o interstitial cystitis Standing low dose valium for spasms DM- continuous glucose monitor and ssi. Standing long acting insulin as well. DM team to follow.   Ambulate today Incentive spirometry  Await final path.   Needs IV fluids today NG out, will start bariatric clears.     LOS: 2 days    Berna Bue 04/29/2023

## 2023-04-29 NOTE — Progress Notes (Signed)
This RN entered room to find the NG tube on the bed and removed by patient. NG with minimal output and patient without any symptoms of nausea or vomiting. Spoke with trauma MD on call Stechschulte about need for placing another NG tube as patient is refusing. Verbal order from Stechschulte that NG tube replacement is not a necessity at this time.   Care ongoing  Hughie Closs, RN

## 2023-04-29 NOTE — Progress Notes (Signed)
Physical Therapy Treatment Patient Details Name: Andrew Tanner MRN: [id number] DOB: [birth year] Today's Date: 04/29/2023   History of Present Illness Pt is a 73 y/o male admitted 1/16 with new dx of duodenal adenoma and pancreatic cystic lesions for surgery.  S/P whipple procedure.  PMHx:  Basal cell carcinoma, , depression, DM, diabetic neuropathy, HTN, IC, Neuromuscular d/o/peripheral neuropathy    PT Comments  Pt showed improvement over eval 1/17, but mostly due to ability to better follow cues.  Emphasis on transition via rolling L and up via L elbow, worked on balance, scooting with same biases L and struggle scooting, sit to stand  and gait with a RW, pt able to better follow cues to minimize heavy L lean and incoordination of L LE in order to improve gait with RW and overall stride length.    If plan is discharge home, recommend the following: A lot of help with walking and/or transfers;A lot of help with bathing/dressing/bathroom;Assistance with cooking/housework;Assist for transportation   Can travel by private vehicle        Equipment Recommendations  Other (comment) (TBA)    Recommendations for Other Services Rehab consult     Precautions / Restrictions Precautions Precautions: Fall     Mobility  Bed Mobility Overal bed mobility: Needs Assistance Bed Mobility: Rolling, Sidelying to Sit   Sidelying to sit: Mod assist       General bed mobility comments: pt did follow direction better today, still getting stuck on obstacle, not able to problem solve moving from forearm w/bearing to w/bearing on hand to push toward midline.    Transfers Overall transfer level: Needs assistance Equipment used: None Transfers: Sit to/from Stand Sit to Stand: Min assist           General transfer comment: Still listing L, but able to take direction and make an uncoordinated attempt to correct. Once holding to the IV pole, pt could maintain static standing with CGA.     Ambulation/Gait Ambulation/Gait assistance: Mod assist, +2 safety/equipment Gait Distance (Feet): 150 Feet Assistive device: 1 person hand held assist, Rolling walker (2 wheels) Gait Pattern/deviations: Step-through pattern, Step-to pattern, Decreased step length - right, Decreased step length - left, Decreased stride length Gait velocity: slow Gait velocity interpretation: <1.8 ft/sec, indicate of risk for recurrent falls   General Gait Details: Still unsteady moderate list L, moderate use of the RW.  Initially small, low amplitude steps, but with RW and moderate cues to concentrate on L LE, pt progressively able to  improve stance and swing L LE to improve with overall larger stride, but still overall uncoordinated L worse than R LE   Stairs             Wheelchair Mobility     Tilt Bed    Modified Rankin (Stroke Patients Only)       Balance Overall balance assessment: Needs assistance Sitting-balance support: Bilateral upper extremity supported, Single extremity supported, Feet supported Sitting balance-Leahy Scale: Poor Sitting balance - Comments: Left bias, but pt better able to compensate with cuing.   Standing balance support: Single extremity supported, Bilateral upper extremity supported, During functional activity, Reliant on assistive device for balance Standing balance-Leahy Scale: Poor Standing balance comment: external support needed                            Cognition Arousal: Alert Behavior During Therapy: Flat affect Overall Cognitive Status: No family/caregiver present to determine baseline cognitive  functioning                                 General Comments: pt is still having trouble cognitively with direction, spatial awareness,  not formally tested.        Exercises      General Comments General comments (skin integrity, edema, etc.): vss      Pertinent Vitals/Pain Pain Assessment Faces Pain Scale: Hurts  little more Pain Descriptors / Indicators: Discomfort Pain Intervention(s): Monitored during session (Encouraged to go a little longer between PCA admin)    Home Living                          Prior Function            PT Goals (current goals can now be found in the care plan section) Acute Rehab PT Goals Patient Stated Goal: 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 PT Goal Formulation: With patient Time For Goal Achievement: 05/12/23 Potential to Achieve Goals: Good Progress towards PT goals: Progressing toward goals    Frequency    Min 1X/week      PT Plan      Co-evaluation              AM-PAC PT "6 Clicks" Mobility   Outcome Measure  Help needed turning from your back to your side while in a flat bed without using bedrails?: A Lot Help needed moving from lying on your back to sitting on the side of a flat bed without using bedrails?: A Lot Help needed moving to and from a bed to a chair (including a wheelchair)?: A Lot Help needed standing up from a chair using your arms (e.g., wheelchair or bedside chair)?: A Lot Help needed to walk in hospital room?: Total Help needed climbing 3-5 steps with a railing? : Total 6 Click Score: 10    End of Session Equipment Utilized During Treatment: Gait belt Activity Tolerance: Patient limited by fatigue Patient left: in chair;with call bell/phone within reach;with chair alarm set Nurse Communication: Mobility status PT Visit Diagnosis: Unsteadiness on feet (R26.81);Other symptoms and signs involving the nervous system (R29.898);Other abnormalities of gait and mobility (R26.89)     Time: 4132-4401 PT Time Calculation (min) (ACUTE ONLY): 24 min  Charges:    $Gait Training: 8-22 mins $Therapeutic Activity: 8-22 mins PT General Charges $$ ACUTE PT VISIT: 1 Visit                     04/29/2023  Andrew Tanner., PT Acute Rehabilitation Services [phone]  (office)   Andrew Tanner 04/29/2023,  4:05 PM

## 2023-04-30 LAB — BASIC METABOLIC PANEL
Anion gap: 7 (ref 5–15)
BUN: 7 mg/dL — ABNORMAL LOW (ref 8–23)
CO2: 21 mmol/L — ABNORMAL LOW (ref 22–32)
Calcium: 8.8 mg/dL — ABNORMAL LOW (ref 8.9–10.3)
Chloride: 107 mmol/L (ref 98–111)
Creatinine, Ser: 1.1 mg/dL (ref 0.61–1.24)
GFR, Estimated: >60 mL/min (ref >60)
Glucose, Bld: 162 mg/dL — ABNORMAL HIGH (ref 70–99)
Potassium: 4 mmol/L (ref 3.5–5.1)
Sodium: 135 mmol/L (ref 135–145)

## 2023-04-30 LAB — MAGNESIUM: Magnesium: 1.7 mg/dL (ref 1.7–2.4)

## 2023-04-30 LAB — CBC
HCT: 31.6 % — ABNORMAL LOW (ref 39.0–52.0)
Hemoglobin: 10.7 g/dL — ABNORMAL LOW (ref 13.0–17.0)
MCH: 30.3 pg (ref 26.0–34.0)
MCHC: 33.9 g/dL (ref 30.0–36.0)
MCV: 89.5 fL (ref 80.0–100.0)
Platelets: 175 10*3/uL (ref 150–400)
RBC: 3.53 MIL/uL — ABNORMAL LOW (ref 4.22–5.81)
RDW: 13.9 % (ref 11.5–15.5)
WBC: 6.4 10*3/uL (ref 4.0–10.5)
nRBC: 0 % (ref 0.0–0.2)

## 2023-04-30 LAB — PHOSPHORUS: Phosphorus: 2.1 mg/dL — ABNORMAL LOW (ref 2.5–4.6)

## 2023-04-30 LAB — GLUCOSE, CAPILLARY: Glucose-Capillary: 159 mg/dL — ABNORMAL HIGH (ref 70–99)

## 2023-04-30 MED ORDER — OXYCODONE HCL 5 MG PO TABS
5.0000 mg | ORAL_TABLET | ORAL | Status: DC | PRN
  Administered 2023-04-30 – 2023-05-07 (×10): 5 mg via ORAL
  Filled 2023-04-30 (×12): qty 1

## 2023-04-30 MED ORDER — METHOCARBAMOL 500 MG PO TABS
500.0000 mg | ORAL_TABLET | Freq: Four times a day (QID) | ORAL | Status: DC | PRN
  Administered 2023-04-30 – 2023-05-09 (×12): 500 mg via ORAL
  Filled 2023-04-30 (×12): qty 1

## 2023-04-30 MED ORDER — SODIUM PHOSPHATES 45 MMOLE/15ML IV SOLN
30.0000 mmol | Freq: Once | INTRAVENOUS | Status: AC
  Administered 2023-04-30: 30 mmol via INTRAVENOUS
  Filled 2023-04-30: qty 10

## 2023-04-30 MED ORDER — MAGNESIUM SULFATE 2 GM/50ML IV SOLN
2.0000 g | Freq: Once | INTRAVENOUS | Status: AC
  Administered 2023-04-30: 2 g via INTRAVENOUS
  Filled 2023-04-30: qty 50

## 2023-04-30 MED ORDER — DOCUSATE SODIUM 100 MG PO CAPS
100.0000 mg | ORAL_CAPSULE | Freq: Two times a day (BID) | ORAL | Status: DC
  Administered 2023-05-02 – 2023-05-08 (×13): 100 mg via ORAL
  Filled 2023-04-30 (×17): qty 1

## 2023-04-30 NOTE — Progress Notes (Addendum)
3 Days Post-Op   Subjective/Chief Complaint: Pain control better, using PCA less.  Tolerated bariatric clears without nausea.  Reports several liquid bowel movements overnight.   Objective: Vital signs in last 24 hours: Temp:  [98.2 F (36.8 C)-99.4 F (37.4 C)] 98.2 F (36.8 C) (01/19 0800) Pulse Rate:  [82-127] 98 (01/19 0800) Resp:  [13-23] 18 (01/19 0800) BP: (95-156)/(68-82) 106/71 (01/19 0800) SpO2:  [95 %-99 %] 98 % (01/19 0800) FiO2 (%):  [21 %] 21 % (01/18 1510) Last BM Date :  (PTA)  Intake/Output from previous day: 01/18 0701 - 01/19 0700 In: 1602.3 [I.V.:1188.2; IV Piggyback:286.1] Out: 1980 [Urine:1225; Drains:755] Intake/Output this shift: Total I/O In: 80 [Other:80] Out: -   General appearance: alert, cooperative, and no distress Resp: breathing comfortably Cardio: regular rate and rhythm GI: soft, non distended. Approp tender, dressing c/d/I.  Drains serosang Extremities: extremities normal, atraumatic, no cyanosis or edema  Lab Results:  Recent Labs    04/29/23 0430 04/30/23 0431  WBC 12.2* 6.4  HGB 11.6* 10.7*  HCT 34.7* 31.6*  PLT 172 175   BMET Recent Labs    04/29/23 0430 04/30/23 0431  NA 134* 135  K 4.1 4.0  CL 108 107  CO2 22 21*  GLUCOSE 147* 162*  BUN 6* 7*  CREATININE 0.80 1.10  CALCIUM 8.8* 8.8*   PT/INR Recent Labs    04/28/23 0603  LABPROT 17.3*  INR 1.4*   ABG Recent Labs    04/27/23 1228 04/27/23 1424  PHART 7.298* 7.264*  HCO3 18.2* 17.9*    Studies/Results: No results found.  Anti-infectives: Anti-infectives (From admission, onward)    Start     Dose/Rate Route Frequency Ordered Stop   04/27/23 2100  ceFAZolin (ANCEF) IVPB 2g/100 mL premix        2 g 200 mL/hr over 30 Minutes Intravenous Every 8 hours 04/27/23 1559 04/27/23 2050   04/27/23 0700  ceFAZolin (ANCEF) IVPB 2g/100 mL premix        2 g 200 mL/hr over 30 Minutes Intravenous On call to O.R. 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  Hypophosphatemia -improved Hypomagnesemia-replace IV H/o interstitial cystitis Standing low dose valium for spasms DM- continuous glucose monitor and ssi. Standing long acting insulin as well. DM team to follow.   Ambulate today Incentive spirometry  Await final path.   Will advance to bariatric full liquids, added some p.o. meds for pain control.  Will leave PCA in place until epidural is removed and then wean off.  Will leave Foley for today.  Mobilize as tolerated.  Plan transfer out of ICU tomorrow, or if a bed is needed today he can be transferred to the progressive unit    LOS: 3 days    Berna Bue 04/30/2023

## 2023-04-30 NOTE — Anesthesia Post-op Follow-up Note (Signed)
  Anesthesia Pain Follow-up Note  Patient: Andrew Tanner  Day #: 3  Date of Follow-up: 04/30/2023 Time: 3:36 PM  Last Vitals:  Vitals:   04/30/23 1200 04/30/23 1300  BP: 127/74 (!) 165/102  Pulse: 84 86  Resp: 16 18  Temp: 36.8 C   SpO2: 100% 98%    Level of Consciousness: alert  Pain: mild   Side Effects:None  Catheter Site Exam:clean, dry, no drainage  Anti-Coag Meds (From admission, onward)    Start     Dose/Rate Route Frequency Ordered Stop   04/27/23 1645  heparin injection 5,000 Units        5,000 Units Subcutaneous Every 8 hours 04/27/23 1559        Epidural / Intrathecal (From admission, onward)    Start     Dose/Rate Route Frequency Ordered Stop   04/27/23 1645  ropivacaine (PF) 2 mg/mL (0.2%) (NAROPIN) injection        8 mL/hr 8 mL/hr  Epidural Continuous 04/27/23 1559          Plan: Continue current therapy of postop epidural at surgeon's request  Earl Lites P [place]

## 2023-04-30 NOTE — Addendum Note (Signed)
Addendum  created 04/30/23 1537 by Atilano Median, DO   Clinical Note Signed

## 2023-05-01 ENCOUNTER — Inpatient Hospital Stay (HOSPITAL_COMMUNITY): Payer: Medicare Other | Admitting: Anesthesiology

## 2023-05-01 LAB — BASIC METABOLIC PANEL
Anion gap: 8 (ref 5–15)
BUN: 9 mg/dL (ref 8–23)
CO2: 23 mmol/L (ref 22–32)
Calcium: 8.9 mg/dL (ref 8.9–10.3)
Chloride: 106 mmol/L (ref 98–111)
Creatinine, Ser: 0.87 mg/dL (ref 0.61–1.24)
GFR, Estimated: >60 mL/min (ref >60)
Glucose, Bld: 134 mg/dL — ABNORMAL HIGH (ref 70–99)
Potassium: 3.8 mmol/L (ref 3.5–5.1)
Sodium: 137 mmol/L (ref 135–145)

## 2023-05-01 LAB — CBC
HCT: 30.7 % — ABNORMAL LOW (ref 39.0–52.0)
Hemoglobin: 10.3 g/dL — ABNORMAL LOW (ref 13.0–17.0)
MCH: 30.3 pg (ref 26.0–34.0)
MCHC: 33.6 g/dL (ref 30.0–36.0)
MCV: 90.3 fL (ref 80.0–100.0)
Platelets: 207 10*3/uL (ref 150–400)
RBC: 3.4 MIL/uL — ABNORMAL LOW (ref 4.22–5.81)
RDW: 13.8 % (ref 11.5–15.5)
WBC: 5.4 10*3/uL (ref 4.0–10.5)
nRBC: 0 % (ref 0.0–0.2)

## 2023-05-01 LAB — GLUCOSE, CAPILLARY
Glucose-Capillary: 125 mg/dL — ABNORMAL HIGH (ref 70–99)
Glucose-Capillary: 142 mg/dL — ABNORMAL HIGH (ref 70–99)
Glucose-Capillary: 148 mg/dL — ABNORMAL HIGH (ref 70–99)
Glucose-Capillary: 159 mg/dL — ABNORMAL HIGH (ref 70–99)
Glucose-Capillary: 176 mg/dL — ABNORMAL HIGH (ref 70–99)

## 2023-05-01 LAB — PHOSPHORUS: Phosphorus: 2.1 mg/dL — ABNORMAL LOW (ref 2.5–4.6)

## 2023-05-01 LAB — MAGNESIUM: Magnesium: 2 mg/dL (ref 1.7–2.4)

## 2023-05-01 MED ORDER — POTASSIUM PHOSPHATES 15 MMOLE/5ML IV SOLN
15.0000 mmol | Freq: Once | INTRAVENOUS | Status: AC
  Administered 2023-05-01: 15 mmol via INTRAVENOUS
  Filled 2023-05-01: qty 5

## 2023-05-01 MED ORDER — PANTOPRAZOLE SODIUM 40 MG PO TBEC
40.0000 mg | DELAYED_RELEASE_TABLET | Freq: Every day | ORAL | Status: DC
  Administered 2023-05-01 – 2023-05-09 (×9): 40 mg via ORAL
  Filled 2023-05-01 (×9): qty 1

## 2023-05-01 NOTE — Progress Notes (Signed)
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  S/P whipple procedure.  1/20 removed the epidural. PMHx:  Basal cell carcinoma, , depression, DM, diabetic neuropathy, HTN, IC, Neuromuscular d/o/peripheral neuropathy    PT Comments  Pt has made notable improvements toward goals.  Pt initiating and following direction significantly better, though I suspect not baseline.  Emphasis on transitions, scooting, sitting balance, sit to stands,  following directions for gait in the RW, progression on gait stability and stamina.     If plan is discharge home, recommend the following: A lot of help with walking and/or transfers;A lot of help with bathing/dressing/bathroom;Assistance with cooking/housework;Assist for transportation   Can travel by private vehicle        Equipment Recommendations  Other (comment)    Recommendations for Other Services Rehab consult     Precautions / Restrictions Precautions Precautions: Fall     Mobility  Bed Mobility Overal bed mobility: Needs Assistance Bed Mobility: Sidelying to Sit Rolling: Contact guard assist         General bed mobility comments: initiated and folllowed cues better,    Transfers Overall transfer level: Needs assistance Equipment used: 1 person hand held assist Transfers: Sit to/from Stand Sit to Stand: Min assist   Step pivot transfers: Mod assist       General transfer comment: cues for hand placement, repetitive within activity.  Hand over hand cues for [facility] armrests.  No boost assist.    Ambulation/Gait Ambulation/Gait assistance: Min assist, +2 safety/equipment, Mod assist Gait Distance (Feet): 360 Feet Assistive device: Rolling walker (2 wheels) Gait Pattern/deviations: Step-through pattern   Gait  velocity interpretation: 1.31 - 2.62 ft/sec, indicative of limited community ambulator   General Gait Details: present, but less lean/drift left, continues slowed gait speed, mod cues to help patient to stay appropriately proximate to the RW and at a steady speed.   Stairs             Wheelchair Mobility     Tilt Bed    Modified Rankin (Stroke Patients Only)       Balance Overall balance assessment: Needs assistance   Sitting balance-Leahy Scale: Fair Sitting balance - Comments: pt still needing cues to square up at EOB, sitting assymetrically , but much improved awareness of midline.     Standing balance-Leahy Scale: Poor Standing balance comment: external support needed                            Cognition Arousal: Alert Behavior During Therapy: Flat affect Overall Cognitive Status: No family/caregiver present to determine baseline cognitive functioning                                          Exercises      General Comments General comments (skin integrity, edema, etc.): vss,      Pertinent Vitals/Pain Pain Assessment Pain Assessment: 0-10 Pain Score: 8  Pain Location: abdomen Pain Descriptors / Indicators: Discomfort, Sore Pain Intervention(s): Monitored during session, Repositioned, Patient requesting pain meds-RN notified (use of PCA)    Home Living  Prior Function            PT Goals (current goals can now be found in the care plan section) Acute Rehab PT Goals PT Goal Formulation: With patient Time For Goal Achievement: 05/12/23 Potential to Achieve Goals: Good Progress towards PT goals: Progressing toward goals    Frequency    Min 1X/week      PT Plan      Co-evaluation PT/OT/SLP Co-Evaluation/Treatment: Yes Reason for Co-Treatment: For patient/therapist safety PT goals addressed during session: Mobility/safety with mobility OT goals addressed during session:  ADL's and self-care      AM-PAC PT "6 Clicks" Mobility   Outcome Measure  Help needed turning from your back to your side while in a flat bed without using bedrails?: A Little Help needed moving from lying on your back to sitting on the side of a flat bed without using bedrails?: A Little Help needed moving to and from a bed to a chair (including a wheelchair)?: A Lot Help needed standing up from a chair using your arms (e.g., wheelchair or bedside chair)?: A Little Help needed to walk in hospital room?: A Lot Help needed climbing 3-5 steps with a railing? : Total 6 Click Score: 14    End of Session Equipment Utilized During Treatment: Gait belt Activity Tolerance: Patient tolerated treatment well;Patient limited by fatigue Patient left: in bed;with call bell/phone within reach;with bed alarm set Nurse Communication: Mobility status PT Visit Diagnosis: Unsteadiness on feet (R26.81);Other abnormalities of gait and mobility (R26.89);Other (comment) (decrease spatial awareness/decrease awareness of deficits)     Time: 1610-9604 PT Time Calculation (min) (ACUTE ONLY): 20 min  Charges:    $Gait Training: 8-22 mins PT General Charges $$ ACUTE PT VISIT: 1 Visit                     05/01/2023  Jacinto Halim., PT Acute Rehabilitation Services [phone]  (office)   Eliseo Gum Fannie Alomar 05/01/2023, 1:51 PM

## 2023-05-01 NOTE — Progress Notes (Signed)
4 Days Post-Op   Subjective/Chief Complaint: Pain control was a little issue yesterday- 'wasn't taking enough pain meds by mouth' but better now  Tolerated bariatric Fulls without nausea.  Reports several liquid bowel movements overnight.   Objective: Vital signs in last 24 hours: Temp:  [97.9 F (36.6 C)-99.4 F (37.4 C)] 97.9 F (36.6 C) (01/20 0800) Pulse Rate:  [67-92] 84 (01/20 0800) Resp:  [14-22] 22 (01/20 0800) BP: (102-168)/(61-102) 117/69 (01/20 0401) SpO2:  [94 %-100 %] 99 % (01/20 0800) Last BM Date :  (PTA)  Intake/Output from previous day: 01/19 0701 - 01/20 0700 In: 962 [P.O.:480; IV Piggyback:298] Out: 960 [Urine:575; Drains:385] Intake/Output this shift: No intake/output data recorded.  General appearance: alert, cooperative, and no distress Resp: breathing comfortably Cardio: regular rate and rhythm GI: soft, non distended. Approp tender, dressing c/d/I.  Drains serosang Extremities: extremities normal, atraumatic, no cyanosis or edema  Lab Results:  Recent Labs    04/30/23 0431 05/01/23 0435  WBC 6.4 5.4  HGB 10.7* 10.3*  HCT 31.6* 30.7*  PLT 175 207   BMET Recent Labs    04/30/23 0431 05/01/23 0435  NA 135 137  K 4.0 3.8  CL 107 106  CO2 21* 23  GLUCOSE 162* 134*  BUN 7* 9  CREATININE 1.10 0.87  CALCIUM 8.8* 8.9   PT/INR No results for input(s): "LABPROT", "INR" in the last 72 hours.  ABG No results for input(s): "PHART", "HCO3" in the last 72 hours.  Invalid input(s): "PCO2", "PO2"   Studies/Results: No results found.  Anti-infectives: Anti-infectives (From admission, onward)    Start     Dose/Rate Route Frequency Ordered Stop   04/27/23 2100  ceFAZolin (ANCEF) IVPB 2g/100 mL premix        2 g 200 mL/hr over 30 Minutes Intravenous Every 8 hours 04/27/23 1559 04/27/23 2050   04/27/23 0700  ceFAZolin (ANCEF) IVPB 2g/100 mL premix        2 g 200 mL/hr over 30 Minutes Intravenous On call to O.R. 04/27/23 0654 04/27/23 1332        Assessment/Plan: s/p Procedure(s): WHIPPLE PROCEDURE (N/A) Duodenal adenoma  Epidural in place PCA dilaudid Tylenol -cont oxycodone, Robaxin (only got oxy twice yesterday, robaxin once yesterday)  ABL anemia - stable.  Hypophosphatemia -replace via IV Hypomagnesemia-resolved H/o interstitial cystitis Standing low dose valium for spasms DM- BS ok. continuous glucose monitor and ssi. Standing long acting insulin as well. DM team to follow.   Ambulate today Incentive spirometry  Await final path.   cont bariatric full liquids, still has room on current  p.o. meds for pain control.  Will leave PCA in place until epidural is removed and then wean off.  Will leave Foley for today.  Mobilize as tolerated.  Plan transfer out of ICU today, or if a bed is needed today he can be transferred to the progressive unit  Will ask anesthesia about epidural removal    LOS: 4 days    Gaynelle Adu 05/01/2023

## 2023-05-01 NOTE — Evaluation (Signed)
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  S/P whipple procedure.  1/20 removed the epidural. PMHx:  Basal cell carcinoma, , depression, DM, diabetic neuropathy, HTN, IC, Neuromuscular d/o/peripheral neuropathy   Clinical Impression   .Patient is s/p whipple surgery resulting in functional limitations due to the deficits listed below (see OT problem list). Pt was living indep and working PTA. Pt at this time with flat affect progressed with adls in bathroom level today with (A) for balance and sequence. Pt lacks some awareness to fall risk at this time.  Patient will benefit from skilled OT acutely to increase independence and safety with ADLS to allow discharge Patient will benefit from intensive inpatient follow up therapy, >3 hours/day .       If plan is discharge home, recommend the following: A little help with walking and/or transfers;A little help with bathing/dressing/bathroom    Functional Status Assessment  Patient has had a recent decline in their functional status and demonstrates the ability to make significant improvements in function in a reasonable and predictable amount of time.  Equipment Recommendations  BSC/3in1;Other (comment) (RW)    Recommendations for Other Services Rehab consult     Precautions / Restrictions Precautions Precautions: Fall Precaution Comments: x2 JP drains R side      Mobility Bed Mobility Overal bed mobility: Needs Assistance Bed Mobility: Sidelying to Sit Rolling: Contact guard assist         General bed mobility comments: initiated and followed cues better,    Transfers Overall transfer level: Needs assistance Equipment used: 1 person hand held assist Transfers: Sit to/from Stand Sit to Stand: Min  assist     Step pivot transfers: Mod assist     General transfer comment: cues for hand placement, repetitive within activity.  Hand over hand cues for [facility] armrests.  No boost assist.      Balance Overall balance assessment: Needs assistance   Sitting balance-Leahy Scale: Fair Sitting balance - Comments: pt still needing cues to square up at EOB, sitting assymetrically , but much improved awareness of midline.     Standing balance-Leahy Scale: Poor Standing balance comment: external support needed                           ADL either performed or assessed with clinical judgement   ADL Overall ADL's : Needs assistance/impaired     Grooming: Supervision/safety;Standing;Wash/dry hands Grooming Details (indicate cue type and reason): needed increase time to recall turning off water and to locate trash can under the sink surface             Lower Body Dressing: Supervision/safety;Sit to/from stand Lower Body Dressing Details (indicate cue type and reason): pt able to sit and cross figure 4 to don doff socks Toilet Transfer: Minimal assistance;Rolling walker (2 wheels) Toilet Transfer Details (indicate cue type and reason): needs hand over hand to recall push up from [facility] surface. pt pulling on RW         Functional mobility during ADLs: Minimal assistance;Rolling walker (2 wheels) General ADL Comments: pt needs cues to keep hands on surface and not pull on RW. pt needs cues and tactile input with transfers due to L bias with RW and drifting. pt without RW with LOB and  needs (A) for stability     Vision Baseline Vision/History: 1 Wears glasses Ability to See in Adequate Light: 0 Adequate Patient Visual Report: No change from baseline       Perception         Praxis         Pertinent Vitals/Pain Pain Assessment Pain Assessment: 0-10 Pain Score: 8  Pain Location: abdomen Pain Descriptors / Indicators: Discomfort, Sore     Extremity/Trunk Assessment  Upper Extremity Assessment Upper Extremity Assessment: Overall WFL for tasks assessed   Lower Extremity Assessment Lower Extremity Assessment: Defer to PT evaluation   Cervical / Trunk Assessment Cervical / Trunk Assessment: Normal   Communication Communication Communication: No apparent difficulties   Cognition Arousal: Alert Behavior During Therapy: Flat affect Overall Cognitive Status: No family/caregiver present to determine baseline cognitive functioning                                       General Comments  VSS HR 125 Max during session. pt self reports "i am not sweating like before"Pt does push PCA x1 during session at the end of session. pt reports wife is advocating for him to use the pain medication more    Exercises     Shoulder Instructions      Home Living Family/patient expects to be discharged to:: Private residence Living Arrangements: Spouse/significant other Available Help at Discharge: Family;Available 24 hours/day Type of Home: House Home Access: Stairs to enter     Home Layout: Two level;Bed/bath upstairs Alternate Level Stairs-Number of Steps: flight Alternate Level Stairs-Rails: Right;Left Bathroom Shower/Tub: Walk-in shower;Tub only   Bathroom Toilet: Handicapped height Bathroom Accessibility: Yes   Home Equipment: Shower seat          Prior Functioning/Environment Prior Level of Function : Independent/Modified Independent;Working/employed;Driving                        OT Problem List: Decreased strength;Decreased activity tolerance;Impaired balance (sitting and/or standing);Decreased cognition;Decreased safety awareness;Decreased knowledge of use of DME or AE;Decreased knowledge of precautions;Cardiopulmonary status limiting activity      OT Treatment/Interventions: Self-care/ADL training;Therapeutic exercise;Energy conservation;DME and/or AE instruction;Manual therapy;Therapeutic activities;Cognitive  remediation/compensation;Balance training;Patient/family education    OT Goals(Current goals can be found in the care plan section) Acute Rehab OT Goals Patient Stated Goal: to lay down OT Goal Formulation: With patient Time For Goal Achievement: 05/15/23 Potential to Achieve Goals: Good  OT Frequency: Min 1X/week    Co-evaluation PT/OT/SLP Co-Evaluation/Treatment: Yes Reason for Co-Treatment: For patient/therapist safety PT goals addressed during session: Mobility/safety with mobility OT goals addressed during session: ADL's and self-care      AM-PAC OT "6 Clicks" Daily Activity     Outcome Measure Help from another person eating meals?: A Little Help from another person taking care of personal grooming?: A Little Help from another person toileting, which includes using toliet, bedpan, or urinal?: A Little Help from another person bathing (including washing, rinsing, drying)?: A Little Help from another person to put on and taking off regular upper body clothing?: A Little Help from another person to put on and taking off regular lower body clothing?: A Little 6 Click Score: 18   End of Session Equipment Utilized During Treatment: Rolling walker (2 wheels) Nurse Communication: Mobility status;Precautions  Activity Tolerance: Patient tolerated treatment well Patient left: in bed;with call bell/phone within reach;with bed alarm set  OT Visit Diagnosis: Unsteadiness on feet (R26.81);Muscle weakness (generalized) (M62.81)                Time: 1610-9604 OT Time Calculation (min): 20 min Charges:  OT General Charges $OT Visit: 1 Visit OT Evaluation $OT Eval Moderate Complexity: 1 Mod   Brynn, OTR/L  Acute Rehabilitation Services Office: [phone] .   Mateo Flow 05/01/2023, 2:05 PM

## 2023-05-01 NOTE — Anesthesia Post-op Follow-up Note (Signed)
  Anesthesia Pain Follow-up Note  Patient: Andrew Tanner  Day #: 4  Date of Follow-up: 05/01/2023 Time: 12:34 PM  Last Vitals:  Vitals:   05/01/23 1135 05/01/23 1200  BP:    Pulse:  91  Resp: 17 (!) 22  Temp:    SpO2:  96%    Level of Consciousness: alert  Pain: mild   Side Effects:None  Catheter Site Exam:clean, dry, no drainage  Anti-Coag Meds (From admission, onward)    Start     Dose/Rate Route Frequency Ordered Stop   04/27/23 1645  heparin injection 5,000 Units        5,000 Units Subcutaneous Every 8 hours 04/27/23 1559        Epidural / Intrathecal (From admission, onward)    Start     Dose/Rate Route Frequency Ordered Stop   04/27/23 1645  ropivacaine (PF) 2 mg/mL (0.2%) (NAROPIN) injection        8 mL/hr 8 mL/hr  Epidural Continuous 04/27/23 1559          Plan: Catheter removed/tip intact at surgeon's request  Collene Schlichter

## 2023-05-01 NOTE — Progress Notes (Signed)
Inpatient Rehab Admissions Coordinator:   Per therapy recommendations,  patient was screened for CIR candidacy by Megan Salon, MS, CCC-SLP  At this time, Pt. is not medically ready for CIR. Not yet tolerating a solid diet.  I will not pursue a rehab consult for this Pt. at this time, but CIR admissions team will follow and monitor for medical readiness and place consult order if Pt. appears to be an appropriate candidate. Please contact me with any questions.   Megan Salon, MS, CCC-SLP Rehab Admissions Coordinator  [phone] (celll) [phone] (office)

## 2023-05-02 LAB — GLUCOSE, CAPILLARY
Glucose-Capillary: 123 mg/dL — ABNORMAL HIGH (ref 70–99)
Glucose-Capillary: 124 mg/dL — ABNORMAL HIGH (ref 70–99)
Glucose-Capillary: 138 mg/dL — ABNORMAL HIGH (ref 70–99)
Glucose-Capillary: 141 mg/dL — ABNORMAL HIGH (ref 70–99)
Glucose-Capillary: 146 mg/dL — ABNORMAL HIGH (ref 70–99)
Glucose-Capillary: 163 mg/dL — ABNORMAL HIGH (ref 70–99)
Glucose-Capillary: 166 mg/dL — ABNORMAL HIGH (ref 70–99)

## 2023-05-02 LAB — BASIC METABOLIC PANEL
Anion gap: 7 (ref 5–15)
BUN: 11 mg/dL (ref 8–23)
CO2: 24 mmol/L (ref 22–32)
Calcium: 9.2 mg/dL (ref 8.9–10.3)
Chloride: 105 mmol/L (ref 98–111)
Creatinine, Ser: 0.79 mg/dL (ref 0.61–1.24)
GFR, Estimated: >60 mL/min (ref >60)
Glucose, Bld: 116 mg/dL — ABNORMAL HIGH (ref 70–99)
Potassium: 4.1 mmol/L (ref 3.5–5.1)
Sodium: 136 mmol/L (ref 135–145)

## 2023-05-02 LAB — CBC
HCT: 35 % — ABNORMAL LOW (ref 39.0–52.0)
Hemoglobin: 11.7 g/dL — ABNORMAL LOW (ref 13.0–17.0)
MCH: 30 pg (ref 26.0–34.0)
MCHC: 33.4 g/dL (ref 30.0–36.0)
MCV: 89.7 fL (ref 80.0–100.0)
Platelets: 263 10*3/uL (ref 150–400)
RBC: 3.9 MIL/uL — ABNORMAL LOW (ref 4.22–5.81)
RDW: 13.7 % (ref 11.5–15.5)
WBC: 12.8 10*3/uL — ABNORMAL HIGH (ref 4.0–10.5)
nRBC: 0 % (ref 0.0–0.2)

## 2023-05-02 LAB — PHOSPHORUS: Phosphorus: 2.5 mg/dL (ref 2.5–4.6)

## 2023-05-02 LAB — MAGNESIUM: Magnesium: 1.7 mg/dL (ref 1.7–2.4)

## 2023-05-02 LAB — SURGICAL PATHOLOGY

## 2023-05-02 MED ORDER — HYDROXYZINE HCL 25 MG PO TABS
50.0000 mg | ORAL_TABLET | Freq: Every evening | ORAL | Status: DC | PRN
  Administered 2023-05-08 – 2023-05-09 (×2): 50 mg via ORAL
  Filled 2023-05-02 (×2): qty 2

## 2023-05-02 MED ORDER — FLUOXETINE HCL 20 MG PO CAPS
60.0000 mg | ORAL_CAPSULE | Freq: Every morning | ORAL | Status: DC
  Administered 2023-05-02 – 2023-05-10 (×9): 60 mg via ORAL
  Filled 2023-05-02 (×9): qty 3

## 2023-05-02 MED ORDER — CLONAZEPAM 0.25 MG PO TBDP: 1.0000 mg | ORAL_TABLET | Freq: Two times a day (BID) | ORAL | Status: DC | PRN

## 2023-05-02 MED ORDER — ALFUZOSIN HCL ER 10 MG PO TB24
10.0000 mg | ORAL_TABLET | Freq: Every day | ORAL | Status: DC
  Administered 2023-05-02 – 2023-05-10 (×9): 10 mg via ORAL
  Filled 2023-05-02 (×10): qty 1

## 2023-05-02 MED ORDER — ROSUVASTATIN CALCIUM 20 MG PO TABS
20.0000 mg | ORAL_TABLET | Freq: Every day | ORAL | Status: DC
  Administered 2023-05-02 – 2023-05-09 (×8): 20 mg via ORAL
  Filled 2023-05-02 (×8): qty 1

## 2023-05-02 MED ORDER — CLONAZEPAM 0.5 MG PO TABS
1.5000 mg | ORAL_TABLET | Freq: Every day | ORAL | Status: DC
  Administered 2023-05-02 – 2023-05-09 (×8): 1.5 mg via ORAL
  Filled 2023-05-02 (×8): qty 3

## 2023-05-02 NOTE — TOC Progression Note (Signed)
Transition of Care [facility]) - Progression Note    Patient Details  Name: Andrew Tanner MRN: [id number] Date of Birth: February 13, 1951  Transition of [facility] CM/SW Contact  Mearl Latin, LCSW Phone Number: 05/02/2023, 9:52 AM  Clinical Narrative:    TOC continuing to follow for needs.         Expected Discharge Plan and Services       Living arrangements for the past 2 months: Hotel/Motel                                       Social Determinants of Health (SDOH) Interventions SDOH Screenings   Food Insecurity: No Food Insecurity (04/27/2023)  Housing: Low Risk  (04/27/2023)  Transportation Needs: No Transportation Needs (04/27/2023)  Utilities: Not At Risk (04/27/2023)  Alcohol Screen: Low Risk  (10/19/2020)  Depression (PHQ2-9): Low Risk  (03/20/2023)  Financial Resource Strain: Low Risk  (03/16/2023)  Physical Activity: Sufficiently Active (03/16/2023)  Social Connections: Socially Integrated (04/27/2023)  Stress: No Stress Concern Present (03/16/2023)  Tobacco Use: Medium Risk (04/27/2023)  Health Literacy: Adequate Health Literacy (01/03/2023)    Readmission Risk Interventions     No data to display

## 2023-05-02 NOTE — Inpatient Diabetes Management (Signed)
Inpatient Diabetes Program Recommendations  AACE/ADA: New Consensus Statement on Inpatient Glycemic Control (2015)  Target Ranges:  Prepandial:   less than 140 mg/dL      Peak postprandial:   less than 180 mg/dL (1-2 hours)      Critically ill patients:  140 - 180 mg/dL   Lab Results  Component Value Date   GLUCAP 123 (H) 05/02/2023   HGBA1C 6.8 (H) 04/21/2023    Review of Glycemic Control  Latest Reference Range & Units 05/01/23 19:50 05/01/23 23:39 05/02/23 03:34 05/02/23 07:26  Glucose-Capillary 70 - 99 mg/dL 161 (H) 096 (H) 045 (H) 123 (H)  (H): Data is abnormally high Diabetes history: DM2 Outpatient Diabetes medications: Lantus 10 units at HS, Lyumjev insulin 3-6 units scale per blood sugar at lunch and supper, Jardiance 25 mg daily, Metformin 1000 mg BID Current orders for Inpatient glycemic control: Semglee 10 units at HS, Novolog 0-15 units correction scale every 4 hours   Inpatient Diabetes Program Recommendations:  Consider increasing Semglee to 14 units at bedtime Following   Thanks, Lujean Rave, MSN, RNC-OB Diabetes Coordinator [phone] (8a-5p)

## 2023-05-02 NOTE — Progress Notes (Addendum)
5 Days Post-Op   Subjective/Chief Complaint: Feels weak, but doing ok overall. Tolerated fulls. Some flatus and a few small liquid BMs. Also having some belching.     Objective: Vital signs in last 24 hours: Temp:  [98.5 F (36.9 C)-100 F (37.8 C)] 98.5 F (36.9 C) (01/21 0800) Pulse Rate:  [75-110] 94 (01/21 0800) Resp:  [12-26] 22 (01/21 0800) BP: (130-162)/(58-99) 141/84 (01/21 0800) SpO2:  [94 %-99 %] 96 % (01/21 0800) FiO2 (%):  [21 %] 21 % (01/20 2002) Last BM Date :  (PTA)  Intake/Output from previous day: 01/20 0701 - 01/21 0700 In: 423.5 [IV Piggyback:255.5] Out: 924 [Urine:454; Drains:470] Intake/Output this shift: No intake/output data recorded.  General appearance: alert, cooperative, and no distress Resp: breathing comfortably Cardio: regular rate and rhythm GI: soft, non distended. Approp tender, dressing c/d/I.  Drains serosang, but one has clot it it.  Extremities: extremities normal, atraumatic, no cyanosis or edema  Lab Results:  Recent Labs    05/01/23 0435 05/02/23 0454  WBC 5.4 12.8*  HGB 10.3* 11.7*  HCT 30.7* 35.0*  PLT 207 263   BMET Recent Labs    05/01/23 0435 05/02/23 0454  NA 137 136  K 3.8 4.1  CL 106 105  CO2 23 24  GLUCOSE 134* 116*  BUN 9 11  CREATININE 0.87 0.79  CALCIUM 8.9 9.2   PT/INR No results for input(s): "LABPROT", "INR" in the last 72 hours.  ABG No results for input(s): "PHART", "HCO3" in the last 72 hours.  Invalid input(s): "PCO2", "PO2"   Studies/Results: No results found.  Anti-infectives: Anti-infectives (From admission, onward)    Start     Dose/Rate Route Frequency Ordered Stop   04/27/23 2100  ceFAZolin (ANCEF) IVPB 2g/100 mL premix        2 g 200 mL/hr over 30 Minutes Intravenous Every 8 hours 04/27/23 1559 04/27/23 2050   04/27/23 0700  ceFAZolin (ANCEF) IVPB 2g/100 mL premix        2 g 200 mL/hr over 30 Minutes Intravenous On call to O.R. 04/27/23 0654 04/27/23 1332        Assessment/Plan: s/p Procedure(s): WHIPPLE PROCEDURE (N/A) Duodenal adenoma - final path pending.   Epidural out. D/c pca today.  Tylenol -cont oxycodone, Robaxin  ABL anemia - stable.  Hypophosphatemia -resolved Hypomagnesemia-resolved H/o interstitial cystitis Switch to klonopin from diazepam.   DM- BS ok. continuous glucose monitor and ssi. Standing long acting insulin as well. DM team to follow.   Ambulate today Incentive spirometry  Await final path.   Try soft diet in AM.  Transfer to floor    LOS: 5 days    [name] 05/02/2023

## 2023-05-02 NOTE — Progress Notes (Signed)
3mL of Hydromorphone (Dilaudid) 1mg /mL PCA injection wasted with Luther Bradley, RN.   Beryl Meager, RN

## 2023-05-03 LAB — GLUCOSE, CAPILLARY
Glucose-Capillary: 139 mg/dL — ABNORMAL HIGH (ref 70–99)
Glucose-Capillary: 142 mg/dL — ABNORMAL HIGH (ref 70–99)
Glucose-Capillary: 125 mg/dL — ABNORMAL HIGH (ref 70–99)
Glucose-Capillary: 138 mg/dL — ABNORMAL HIGH (ref 70–99)
Glucose-Capillary: 143 mg/dL — ABNORMAL HIGH (ref 70–99)

## 2023-05-03 LAB — TYPE AND SCREEN
ABO/RH(D): A POS
Antibody Screen: NEGATIVE
Unit division: 0
Unit division: 0
Unit division: 0
Unit division: 0
Unit division: 0
Unit division: 0
Unit division: 0
Unit division: 0

## 2023-05-03 LAB — BPAM RBC
Blood Product Expiration Date: 202502062359
Blood Product Expiration Date: 202502062359
Blood Product Expiration Date: 202502072359
Blood Product Expiration Date: 202502072359
Blood Product Expiration Date: 202502082359
Blood Product Expiration Date: 202502102359
Blood Product Expiration Date: 202502102359
Blood Product Expiration Date: 202502112359
ISSUE DATE / TIME: 202501180508
ISSUE DATE / TIME: 202501180647
ISSUE DATE / TIME: 202501181333
ISSUE DATE / TIME: 202501182120
Unit Type and Rh: 6200
Unit Type and Rh: 6200
Unit Type and Rh: 6200
Unit Type and Rh: 6200
Unit Type and Rh: 6200
Unit Type and Rh: 6200
Unit Type and Rh: 6200
Unit Type and Rh: 6200

## 2023-05-03 LAB — COMPREHENSIVE METABOLIC PANEL
ALT: 42 U/L (ref 0–44)
AST: 30 U/L (ref 15–41)
Albumin: 2.1 g/dL — ABNORMAL LOW (ref 3.5–5.0)
Alkaline Phosphatase: 92 U/L (ref 38–126)
Anion gap: 10 (ref 5–15)
BUN: 14 mg/dL (ref 8–23)
CO2: 24 mmol/L (ref 22–32)
Calcium: 9.2 mg/dL (ref 8.9–10.3)
Chloride: 100 mmol/L (ref 98–111)
Creatinine, Ser: 0.77 mg/dL (ref 0.61–1.24)
GFR, Estimated: >60 mL/min (ref >60)
Glucose, Bld: 152 mg/dL — ABNORMAL HIGH (ref 70–99)
Potassium: 3.9 mmol/L (ref 3.5–5.1)
Sodium: 134 mmol/L — ABNORMAL LOW (ref 135–145)
Total Bilirubin: 0.5 mg/dL (ref 0.0–1.2)
Total Protein: 5.2 g/dL — ABNORMAL LOW (ref 6.5–8.1)

## 2023-05-03 LAB — URINALYSIS, COMPLETE (UACMP) WITH MICROSCOPIC
Bilirubin Urine: NEGATIVE
Glucose, UA: 50 mg/dL — AB
Hgb urine dipstick: NEGATIVE
Ketones, ur: 20 mg/dL — AB
Leukocytes,Ua: NEGATIVE
Nitrite: NEGATIVE
Protein, ur: 100 mg/dL — AB
Specific Gravity, Urine: 1.033 — ABNORMAL HIGH (ref 1.005–1.030)
pH: 5 (ref 5.0–8.0)

## 2023-05-03 NOTE — Progress Notes (Signed)
PT Cancellation Note  Patient Details Name: Andrew Tanner MRN: [id number] DOB: [birth year]   Cancelled Treatment:    Reason Eval/Treat Not Completed: Patient declined, stating he was tired and did not feel like physical therapy today. Educated pt on the importance of continued mobility. Acute PT to follow and re-attempt as able.  Hilton Cork, PT, DPT Secure Chat Preferred  Rehab Office [phone]   Arturo Morton Brion Aliment 05/03/2023, 1:47 PM

## 2023-05-03 NOTE — Progress Notes (Signed)
[facility] IP rehab admissions - I met with patient and his wife at the bedside.  Wife would like rehab prior to home with her.  Patient does not feel he needs the inpatient rehab program.  Patient would like to do his rehab on his own at home.  I did give him brochures about rehab and I explained rehab program.  Patient will discuss with his wife.  I will check back tomorrow for plans.  Patient is doing okay with therapies requiring min assist to supervision with activities.  [phone]

## 2023-05-03 NOTE — Care Management Important Message (Signed)
Important Message  Patient Details  Name: Andrew Tanner MRN: [id number] Date of Birth: March 26, 1951   Important Message Given:  Yes - Medicare IM     Dorena Bodo 05/03/2023, 2:45 PM

## 2023-05-03 NOTE — Progress Notes (Addendum)
6 Days Post-Op   Subjective/Chief Complaint: Transferred to floor yesterday. Tolerating full liquids. Still with some belching, but less.     Objective: Vital signs in last 24 hours: Temp:  [98.3 F (36.8 C)-100.2 F (37.9 C)] 98.3 F (36.8 C) (01/22 0430) Pulse Rate:  [91-97] 95 (01/22 0430) Resp:  [14-22] 18 (01/22 0430) BP: (115-148)/(70-121) 133/70 (01/22 0430) SpO2:  [96 %-97 %] 97 % (01/22 0430) Last BM Date : 04/28/23  Intake/Output from previous day: 01/21 0701 - 01/22 0700 In: 250 [P.O.:250] Out: 740 [Urine:300; Drains:440] Intake/Output this shift: Total I/O In: -  Out: 170 [Drains:170]  General appearance: alert, cooperative, and no distress Resp: breathing comfortably Cardio: regular rate and rhythm GI: soft, non distended. Approp tender, dressing c/d/I.  Drains serosang, but one has a bit of murky drainage in the tube.  Will keep an eye on it.   Extremities: extremities normal, atraumatic, no cyanosis or edema  Lab Results:  Recent Labs    05/01/23 0435 05/02/23 0454  WBC 5.4 12.8*  HGB 10.3* 11.7*  HCT 30.7* 35.0*  PLT 207 263   BMET Recent Labs    05/02/23 0454 05/03/23 0802  NA 136 134*  K 4.1 3.9  CL 105 100  CO2 24 24  GLUCOSE 116* 152*  BUN 11 14  CREATININE 0.79 0.77  CALCIUM 9.2 9.2   PT/INR No results for input(s): "LABPROT", "INR" in the last 72 hours.  ABG No results for input(s): "PHART", "HCO3" in the last 72 hours.  Invalid input(s): "PCO2", "PO2"   Studies/Results: No results found.  Anti-infectives: Anti-infectives (From admission, onward)    Start     Dose/Rate Route Frequency Ordered Stop   04/27/23 2100  ceFAZolin (ANCEF) IVPB 2g/100 mL premix        2 g 200 mL/hr over 30 Minutes Intravenous Every 8 hours 04/27/23 1559 04/27/23 2050   04/27/23 0700  ceFAZolin (ANCEF) IVPB 2g/100 mL premix        2 g 200 mL/hr over 30 Minutes Intravenous On call to O.R. 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  Negative margins.   Transferred to floor Tylenol -cont oxycodone, Robaxin  ABL anemia - stable.  Hypophosphatemia -resolved Hypomagnesemia-resolved H/o interstitial cystitis Switch to klonopin from diazepam.   DM- BS ok. continuous glucose monitor and ssi. Standing long acting insulin as well. DM team to follow.   Ambulate  Incentive spirometry Low grade temps.  Will keep an eye on drains.  Send u/a today.    Soft diet today.    LOS: 6 days    [name] 05/03/2023

## 2023-05-03 NOTE — PMR Pre-admission (Shared)
PMR Admission Coordinator Pre-Admission Assessment  Patient: Andrew Tanner is an 73 y.o., male MRN: [id number] DOB: 19-Apr-1950 Height: 5\' 11"  (180.3 cm) Weight: 80.7 kg  Insurance Information HMO: ***    PPO: ***     PCP: ***     IPA: ***     80/20: ***     OTHER: *** PRIMARY: BCBS Medicare      Policy#: [id number]      Subscriber: patient CM Name: ***      Phone#: ***     Fax#: *** Pre-Cert#: ***      Employer: Retired, but works on [employer] at home Benefits:  Phone #: [phone]     Name: *** Eff. Date: ***     Deduct: ***      Out of Pocket Max: ***      Life Max: *** CIR: ***      SNF: *** Outpatient: ***     Co-Pay: *** Home Health: ***      Co-Pay: *** DME: ***     Co-Pay: *** Providers: in network  SECONDARY:       Policy#:      Phone#:   Artist:       Phone#:   The Data processing manager" for patients in Inpatient Rehabilitation Facilities with attached "Privacy Act Statement-Health Care Records" was provided and verbally reviewed with: Patient  Emergency Contact Information Contact Information     Name Relation Home Work Mobile   [place] Spouse   [phone]   Zakarias, Shirazi Daughter   [phone]      Other Contacts   None on File     Current Medical History  Patient Admitting Diagnosis: Debility/Whipple procedure  History of Present Illness: A 73 y/o male admitted 1/16 with new dx of duodenal adenoma and pancreatic cystic lesions for surgery. S/P whipple procedure. 1/20 removed the epidural. PMHx: Basal cell carcinoma, , depression, DM, diabetic neuropathy, HTN, IC, Neuromuscular d/o/peripheral neuropathy.  PT/OT evaluations completed with recommendations for acute inpatient rehab admission.     Patient's medical record from [facility] has been reviewed by the rehabilitation admission coordinator and physician.  Past Medical History  Past Medical History:  Diagnosis Date   Anxiety    Atrial myxoma     Basal cell carcinoma    Cancer (HCC)    basal cell skin CA   Cataract    Depression    Diabetes mellitus without complication (HCC)    Diabetic neuropathy (HCC)    Duodenal adenoma    Hyperlipidemia    Hypertension    IC (interstitial cystitis)    Neuromuscular disorder (HCC) 01/09/21   Peripheral  Neuropathy   Pancreatic lesion    Squamous cell carcinoma of skin     Has the patient had major surgery during 100 days prior to admission? Yes  Family History   family history includes Anxiety disorder in his brother; Depression in his brother; Diabetes in his brother and father; Heart disease in his father; Hyperlipidemia in his father; Lung cancer in his mother; Renal cancer in his brother.  Current Medications  Current Facility-Administered Medications:    0.9 %  sodium chloride infusion, 10 mL/hr, Intravenous, Once, [name], MD   acetaminophen (TYLENOL) tablet 650 mg, 650 mg, Oral, Q6H, [name], MD, 650 mg at 05/03/23 1006   alfuzosin (UROXATRAL) 24 hr tablet 10 mg, 10 mg, Oral, Q breakfast, [name], MD, 10 mg at 05/03/23 1006   Chlorhexidine Gluconate Cloth 2 % PADS  6 each, 6 each, Topical, Daily, [name], MD, 6 each at 05/02/23 1048   clonazePAM (KLONOPIN) disintegrating tablet 1 mg, 1 mg, Oral, BID PRN, [name], MD   clonazePAM (KLONOPIN) tablet 1.5 mg, 1.5 mg, Oral, QHS, [name], MD, 1.5 mg at 05/02/23 2129   diphenhydrAMINE (BENADRYL) 12.5 MG/5ML elixir 12.5 mg, 12.5 mg, Oral, Q6H PRN **OR** diphenhydrAMINE (BENADRYL) injection 12.5 mg, 12.5 mg, Intravenous, Q6H PRN, [name], MD   docusate sodium (COLACE) capsule 100 mg, 100 mg, Oral, BID, [name], MD, 100 mg at 05/03/23 1006   FLUoxetine (PROZAC) capsule 60 mg, 60 mg, Oral, q morning, [name], MD, 60 mg at 05/03/23 1006   heparin injection 5,000 Units, 5,000 Units, Subcutaneous, Q8H, [name], MD, 5,000 Units at 05/03/23 1307   HYDROmorphone (DILAUDID) injection  0.5-2 mg, 0.5-2 mg, Intravenous, Q3H PRN, [name], MD   hydrOXYzine (ATARAX) tablet 50 mg, 50 mg, Oral, QHS PRN, [name], MD   insulin aspart (novoLOG) injection 0-15 Units, 0-15 Units, Subcutaneous, Q4H, [name], MD, 2 Units at 05/03/23 1307   insulin glargine-yfgn (SEMGLEE) injection 10 Units, 10 Units, Subcutaneous, QHS, [name], MD, 10 Units at 05/02/23 2347   methocarbamol (ROBAXIN) tablet 500 mg, 500 mg, Oral, Q6H PRN, [name], MD, 500 mg at 05/03/23 1006   Oral care mouth rinse, 15 mL, Mouth Rinse, PRN, [name], MD   oxyCODONE (Oxy IR/ROXICODONE) immediate release tablet 5 mg, 5 mg, Oral, Q4H PRN, [name], MD, 5 mg at 05/02/23 1540   pantoprazole (PROTONIX) EC tablet 40 mg, 40 mg, Oral, QHS, [name], MD, 40 mg at 05/02/23 2128   phenol (CHLORASEPTIC) mouth spray 1 spray, 1 spray, Mouth/Throat, PRN, [name], MD, 1 spray at 04/27/23 2155   prochlorperazine (COMPAZINE) tablet 10 mg, 10 mg, Oral, Q6H PRN **OR** prochlorperazine (COMPAZINE) injection 5-10 mg, 5-10 mg, Intravenous, Q6H PRN, [name], MD   rosuvastatin (CRESTOR) tablet 20 mg, 20 mg, Oral, QHS, [name], MD, 20 mg at 05/02/23 2128  Patients Current Diet:  Diet Order             DIET SOFT Room service appropriate? Yes; Fluid consistency: Thin  Diet effective now                   Precautions / Restrictions Precautions Precautions: Fall Precaution Comments: x2 JP drains R side Restrictions Weight Bearing Restrictions Per Provider Order: No   Has the patient had 2 or more falls or a fall with injury in the past year? Yes  Prior Activity Level Community (5-7x/wk): Went out daily.  Prior Functional Level Self Care: Did the patient need help bathing, dressing, using the toilet or eating? Independent  Indoor Mobility: Did the patient need assistance with walking from room to room (with or without device)? Independent  Stairs: Did the patient need  assistance with internal or external stairs (with or without device)? Independent  Functional Cognition: Did the patient need help planning regular tasks such as shopping or remembering to take medications? Independent  Patient Information Are you of Hispanic, Latino/a,or Spanish origin?: A. No, not of Hispanic, Latino/a, or Spanish origin What is your race?: A. White Do you need or want an interpreter to communicate with a doctor or health care staff?: 0. No  Patient's Response To:  Health Literacy and Transportation Is the patient able to respond to health literacy and transportation needs?: Yes Health Literacy - How often do you need to have someone  help you when you read instructions, pamphlets, or other written material from your doctor or pharmacy?: Never In the past 12 months, has lack of transportation kept you from medical appointments or from getting medications?: No In the past 12 months, has lack of transportation kept you from meetings, work, or from getting things needed for daily living?: No  Home Assistive Devices / Equipment Home Equipment: Shower seat  Prior Device Use: Indicate devices/aids used by the patient prior to current illness, exacerbation or injury?  Used a walking stick.  Current Functional Level Cognition  Overall Cognitive Status: No family/caregiver present to determine baseline cognitive functioning Orientation Level: Oriented X4 General Comments: cues for safety    Extremity Assessment (includes Sensation/Coordination)  Upper Extremity Assessment: Overall WFL for tasks assessed  Lower Extremity Assessment: Defer to PT evaluation    ADLs  Overall ADL's : Needs assistance/impaired Grooming: Wash/dry hands, Wash/dry face, Oral care, Set up, Sitting Grooming Details (indicate cue type and reason): needed increase time to recall turning off water and to locate trash can under the sink surface Upper Body Bathing: Set up, Sitting Lower Body Bathing:  Moderate assistance, Sit to/from stand Upper Body Dressing : Supervision/safety, Sitting Upper Body Dressing Details (indicate cue type and reason): to change gowns Lower Body Dressing: Supervision/safety, Sit to/from stand Lower Body Dressing Details (indicate cue type and reason): pt able to sit and cross figure 4 to don doff socks Toilet Transfer: Minimal assistance Toilet Transfer Details (indicate cue type and reason): cues for hand placement, HHA Functional mobility during ADLs: Minimal assistance, Rolling walker (2 wheels) General ADL Comments: pt needs cues to keep hands on surface and not pull on RW. pt needs cues and tactile input with transfers due to L bias with RW and drifting. pt without RW with LOB and needs (A) for stability    Mobility  Overal bed mobility: Needs Assistance Bed Mobility: Sidelying to Sit, Rolling Rolling: Contact guard assist Sidelying to sit: Min assist, Used rails Sit to sidelying: Mod assist General bed mobility comments: cues for rolling and assistance for trunk with use of bed rail    Transfers  Overall transfer level: Needs assistance Equipment used: 1 person hand held assist Transfers: Sit to/from Stand Sit to Stand: Min assist Bed to/from chair/wheelchair/BSC transfer type:: Step pivot Step pivot transfers: Min assist General transfer comment: cues for hand placement and safety    Ambulation / Gait / Stairs / Wheelchair Mobility  Ambulation/Gait Ambulation/Gait assistance: Min assist, +2 safety/equipment, Mod assist Gait Distance (Feet): 360 Feet Assistive device: Rolling walker (2 wheels) Gait Pattern/deviations: Step-through pattern General Gait Details: present, but less lean/drift left, continues slowed gait speed, mod cues to help patient to stay appropriately proximate to the RW and at a steady speed. Gait velocity: slow Gait velocity interpretation: 1.31 - 2.62 ft/sec, indicative of limited community ambulator    Posture / Balance  Dynamic Sitting Balance Sitting balance - Comments: EOB Balance Overall balance assessment: Needs assistance Sitting-balance support: Bilateral upper extremity supported, Single extremity supported, Feet supported Sitting balance-Leahy Scale: Fair Sitting balance - Comments: EOB Standing balance support: Single extremity supported, During functional activity Standing balance-Leahy Scale: Poor Standing balance comment: external support needed    Special needs/care consideration {Special Care Needs/Care Considerations:304600603}   Previous Home Environment (from acute therapy documentation) Living Arrangements: Spouse/significant other Available Help at Discharge: Family, Available 24 hours/day Type of Home: House Home Layout: Two level, Bed/bath upstairs Alternate Level Stairs-Rails: Right, Left Alternate Level Stairs-Number of  Steps: flight Home Access: Stairs to enter Bathroom Shower/Tub: Walk-in shower, Tub only Firefighter: Handicapped height Bathroom Accessibility: Yes Home Care Services: No  Discharge Living Setting Plans for Discharge Living Setting: Patient's home, House, Lives with (comment) (Lives with wife.) Type of Home at Discharge: House Discharge Home Layout: Two level, Bed/bath upstairs, 1/2 bath on main level Alternate Level Stairs-Rails: Right, Left, Can reach both Alternate Level Stairs-Number of Steps: 12-14 Discharge Home Access: Stairs to enter Entrance Stairs-Rails: Right, Left, Can reach both Entrance Stairs-Number of Steps: 4-5 steps Discharge Bathroom Shower/Tub: Tub/shower unit, Door Discharge Bathroom Toilet: Handicapped height Discharge Bathroom Accessibility: Yes How Accessible: Accessible via walker Does the patient have any problems obtaining your medications?: No  Social/Family/Support Systems Patient Roles: Spouse, Parent (Has wife and daughter.) Contact Information: Marilynne Drivers - spouse Anticipated Caregiver: [name] [phone] Ability/Limitations of Caregiver: Wife works PT from home.  Wife can provide supervision. Caregiver Availability: 24/7 Discharge Plan Discussed with Primary Caregiver: Yes Is Caregiver In Agreement with Plan?: Yes Does Caregiver/Family have Issues with Lodging/Transportation while Pt is in Rehab?: No  Goals Patient/Family Goal for Rehab: PT/OT supervision goals Expected length of stay: 10-12 days Pt/Family Agrees to Admission and willing to participate: Yes Program Orientation Provided & Reviewed with Pt/Caregiver Including Roles  & Responsibilities: Yes  Decrease burden of Care through IP rehab admission: N/A  Possible need for SNF placement upon discharge: Not planned  Patient Condition: I have reviewed medical records from [facility], spoken with CM, and patient and spouse. I met with patient at the bedside and discussed via phone for inpatient rehabilitation assessment.  Patient will benefit from ongoing PT and OT, can actively participate in 3 hours of therapy a day 5 days of the week, and can make measurable gains during the admission.  Patient will also benefit from the coordinated team approach during an Inpatient Acute Rehabilitation admission.  The patient will receive intensive therapy as well as Rehabilitation physician, nursing, social worker, and care management interventions.  Due to bladder management, bowel management, safety, skin/wound care, disease management, medication administration, pain management, patient education, and JP drains  the patient requires 24 hour a day rehabilitation nursing.  The patient is currently *** with mobility and basic ADLs.  Discharge setting and therapy post discharge at home with home health is anticipated.  Patient has agreed to participate in the Acute Inpatient Rehabilitation Program and will admit {Time; today/tomorrow:10263}.  Preadmission Screen Completed By:  Trish Mage, 05/03/2023 2:26  PM ______________________________________________________________________   Discussed status with Dr. Marland Kitchen on *** at *** and received approval for admission today.  Admission Coordinator:  Trish Mage, RN, time Marland KitchenDorna Bloom ***   Assessment/Plan: Diagnosis: *** Does the need for close, 24 hr/day Medical supervision in concert with the patient's rehab needs make it unreasonable for this patient to be served in a less intensive setting? {yes_no_potentially:3041433} Co-Morbidities requiring supervision/potential complications: *** Due to {due NT:6144315}, does the patient require 24 hr/day rehab nursing? {yes_no_potentially:3041433} Does the patient require coordinated care of a physician, rehab nurse, PT, OT, and SLP to address physical and functional deficits in the context of the above medical diagnosis(es)? {yes_no_potentially:3041433} Addressing deficits in the following areas: {deficits:3041436} Can the patient actively participate in an intensive therapy program of at least 3 hrs of therapy 5 days a week? {yes_no_potentially:3041433} The potential for patient to make measurable gains while on inpatient rehab is {potential:3041437} Anticipated functional outcomes upon discharge from inpatient rehab: {functional outcomes:304600100} PT, {functional  outcomes:304600100} OT, {functional outcomes:304600100} SLP Estimated rehab length of stay to reach the above functional goals is: *** Anticipated discharge destination: {anticipated dc setting:21604} 10. Overall Rehab/Functional Prognosis: {potential:3041437}   MD Signature: ***

## 2023-05-03 NOTE — Progress Notes (Signed)
Occupational Therapy Treatment Patient Details Name: Andrew Tanner MRN: [id number] DOB: 08-05-1950 Today's Date: 05/03/2023   History of present illness Pt is a 73 y/o male admitted 1/16 with new dx of duodenal adenoma and pancreatic cystic lesions for surgery.  S/P whipple procedure.  1/20 removed the epidural. PMHx:  Basal cell carcinoma, , depression, DM, diabetic neuropathy, HTN, IC, Neuromuscular d/o/peripheral neuropathy   OT comments  Patient making good progress with OT treatment with min assist for bed mobility and to ambulate to bathroom with one person HHA and min assist for balance. Patient able to perform grooming and UB bathing/dressing seated and ambulated to recliner to change sock with supervision using figure 4 to reach feet. Patient will benefit from intensive inpatient follow up therapy, >3 hours/day with acute OT to continue to follow to address bathing, dressing, and toilet transfers.       If plan is discharge home, recommend the following:  A little help with walking and/or transfers;A little help with bathing/dressing/bathroom   Equipment Recommendations  BSC/3in1;Other (comment) (RW)    Recommendations for Other Services      Precautions / Restrictions Precautions Precautions: Fall Precaution Comments: x2 JP drains R side Restrictions Weight Bearing Restrictions Per Provider Order: No       Mobility Bed Mobility Overal bed mobility: Needs Assistance Bed Mobility: Sidelying to Sit, Rolling Rolling: Contact guard assist Sidelying to sit: Min assist, Used rails       General bed mobility comments: cues for rolling and assistance for trunk with use of bed rail    Transfers Overall transfer level: Needs assistance Equipment used: 1 person hand held assist Transfers: Sit to/from Stand Sit to Stand: Min assist     Step pivot transfers: Min assist     General transfer comment: cues for hand placement and safety     Balance Overall balance  assessment: Needs assistance Sitting-balance support: Bilateral upper extremity supported, Single extremity supported, Feet supported Sitting balance-Leahy Scale: Fair Sitting balance - Comments: EOB   Standing balance support: Single extremity supported, During functional activity Standing balance-Leahy Scale: Poor Standing balance comment: external support needed                           ADL either performed or assessed with clinical judgement   ADL Overall ADL's : Needs assistance/impaired     Grooming: Wash/dry hands;Wash/dry face;Oral care;Set up;Sitting   Upper Body Bathing: Set up;Sitting   Lower Body Bathing: Moderate assistance;Sit to/from stand   Upper Body Dressing : Supervision/safety;Sitting Upper Body Dressing Details (indicate cue type and reason): to change gowns Lower Body Dressing: Supervision/safety;Sit to/from stand Lower Body Dressing Details (indicate cue type and reason): pt able to sit and cross figure 4 to don doff socks Toilet Transfer: Minimal assistance Toilet Transfer Details (indicate cue type and reason): cues for hand placement, HHA                Extremity/Trunk Assessment              Vision       Perception     Praxis      Cognition Arousal: Alert Behavior During Therapy: Flat affect Overall Cognitive Status: No family/caregiver present to determine baseline cognitive functioning                                 General Comments: cues for safety  Exercises      Shoulder Instructions       General Comments VSS on RA    Pertinent Vitals/ Pain       Pain Assessment Pain Assessment: 0-10 Pain Score: 4  Pain Location: abdomen Pain Descriptors / Indicators: Discomfort, Sore Pain Intervention(s): Monitored during session, Repositioned  Home Living                                          Prior Functioning/Environment              Frequency  Min 1X/week         Progress Toward Goals  OT Goals(current goals can now be found in the care plan section)  Progress towards OT goals: Progressing toward goals  Acute Rehab OT Goals Patient Stated Goal: get stronger OT Goal Formulation: With patient Time For Goal Achievement: 05/15/23 Potential to Achieve Goals: Good ADL Goals Pt Will Perform Grooming: with modified independence;sitting Pt Will Perform Upper Body Dressing: with modified independence;sitting Pt Will Perform Lower Body Dressing: with modified independence;sit to/from stand Pt Will Transfer to Toilet: with supervision;ambulating;bedside commode Additional ADL Goal #1: pt will demonstrate dyanamic balance with sink level grooming supervision level  Plan      Co-evaluation                 AM-PAC OT "6 Clicks" Daily Activity     Outcome Measure   Help from another person eating meals?: A Little Help from another person taking care of personal grooming?: A Little Help from another person toileting, which includes using toliet, bedpan, or urinal?: A Little Help from another person bathing (including washing, rinsing, drying)?: A Little Help from another person to put on and taking off regular upper body clothing?: A Little Help from another person to put on and taking off regular lower body clothing?: A Little 6 Click Score: 18    End of Session    OT Visit Diagnosis: Unsteadiness on feet (R26.81);Muscle weakness (generalized) (M62.81)   Activity Tolerance Patient tolerated treatment well   Patient Left in chair;with call bell/phone within reach;with chair alarm set   Nurse Communication Mobility status;Precautions        Time: 3875-6433 OT Time Calculation (min): 24 min  Charges: OT General Charges $OT Visit: 1 Visit OT Treatments $Self Care/Home Management : 23-37 mins  Alfonse Flavors, OTA Acute Rehabilitation Services  Office [phone]   Dewain Penning 05/03/2023, 10:26 AM

## 2023-05-04 LAB — GLUCOSE, CAPILLARY
Glucose-Capillary: 155 mg/dL — ABNORMAL HIGH (ref 70–99)
Glucose-Capillary: 132 mg/dL — ABNORMAL HIGH (ref 70–99)
Glucose-Capillary: 140 mg/dL — ABNORMAL HIGH (ref 70–99)
Glucose-Capillary: 122 mg/dL — ABNORMAL HIGH (ref 70–99)
Glucose-Capillary: 136 mg/dL — ABNORMAL HIGH (ref 70–99)

## 2023-05-04 LAB — COMPREHENSIVE METABOLIC PANEL
ALT: 29 U/L (ref 0–44)
AST: 16 U/L (ref 15–41)
Albumin: 1.9 g/dL — ABNORMAL LOW (ref 3.5–5.0)
Alkaline Phosphatase: 87 U/L (ref 38–126)
Anion gap: 8 (ref 5–15)
BUN: 14 mg/dL (ref 8–23)
CO2: 25 mmol/L (ref 22–32)
Calcium: 8.5 mg/dL — ABNORMAL LOW (ref 8.9–10.3)
Chloride: 98 mmol/L (ref 98–111)
Creatinine, Ser: 0.7 mg/dL (ref 0.61–1.24)
GFR, Estimated: >60 mL/min (ref >60)
Glucose, Bld: 148 mg/dL — ABNORMAL HIGH (ref 70–99)
Potassium: 3.4 mmol/L — ABNORMAL LOW (ref 3.5–5.1)
Sodium: 131 mmol/L — ABNORMAL LOW (ref 135–145)
Total Bilirubin: 0.5 mg/dL (ref 0.0–1.2)
Total Protein: 4.7 g/dL — ABNORMAL LOW (ref 6.5–8.1)

## 2023-05-04 LAB — CBC
HCT: 29.8 % — ABNORMAL LOW (ref 39.0–52.0)
Hemoglobin: 9.8 g/dL — ABNORMAL LOW (ref 13.0–17.0)
MCH: 29.5 pg (ref 26.0–34.0)
MCHC: 32.9 g/dL (ref 30.0–36.0)
MCV: 89.8 fL (ref 80.0–100.0)
Platelets: 299 10*3/uL (ref 150–400)
RBC: 3.32 MIL/uL — ABNORMAL LOW (ref 4.22–5.81)
RDW: 13.4 % (ref 11.5–15.5)
WBC: 8.3 10*3/uL (ref 4.0–10.5)
nRBC: 0 % (ref 0.0–0.2)

## 2023-05-04 MED ORDER — POTASSIUM CHLORIDE CRYS ER 20 MEQ PO TBCR
40.0000 meq | EXTENDED_RELEASE_TABLET | Freq: Every day | ORAL | Status: DC
  Administered 2023-05-04 – 2023-05-10 (×7): 40 meq via ORAL
  Filled 2023-05-04 (×7): qty 2

## 2023-05-04 NOTE — Plan of Care (Signed)

## 2023-05-04 NOTE — Progress Notes (Signed)
IP rehab admissions - I met with patient and his wife at the bedside.  Patient feels he is doing well, getting up to bathroom by himself he says.  I told patient to discuss his progress with therapy to see if he can DC home with in home therapies.  Wife is concerned about the 14 steps to upstairs bedroom.  Patient is not agreeable to CIR at this point.  [phone]

## 2023-05-04 NOTE — Progress Notes (Signed)
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  S/P whipple procedure. 1/20 removed the epidural. PMHx: Basal cell carcinoma, depression, DM, diabetic neuropathy, HTN, IC, Neuromuscular d/o/peripheral neuropathy    PT Comments  Pt received in supine, agreeable to therapy session and with good participation and tolerance for transfer, gait and stair training with 7" platform step in the room. Pt needing up to CGA for stair negotiation with R rail and gait with no AD. Discussion with pt and spouse about energy conservation and benefits of rollator for activity pacing/safety and pt/spouse agreeable to DME update to include rollator, discussed with supervising PT Graciella Belton. Disposition updated to include HHPT as per chart review pt prefers to DC home rather than AIR and is progressing well toward his functional mobility goals this date.      If plan is discharge home, recommend the following: Assistance with cooking/housework;Assist for transportation;A little help with walking and/or transfers;A little help with bathing/dressing/bathroom;Help with stairs or ramp for entrance   Can travel by private vehicle        Equipment Recommendations  Rollator (4 wheels)    Recommendations for Other Services       Precautions / Restrictions Precautions Precautions: Fall Precaution Comments: x2 JP drains R side Restrictions Weight Bearing Restrictions Per Provider Order: No     Mobility  Bed Mobility Overal bed mobility: Needs Assistance Bed Mobility: Sidelying to Sit, Rolling Rolling: Contact guard assist Sidelying to sit: Min assist, Used rails       General bed mobility comments: cues for rolling and assistance for trunk with use of bed rail, via log roll for abdominal comfort.  CGA for safety with unfamiliar technique    Transfers Overall transfer level: Needs assistance Equipment used: None Transfers: Sit to/from Stand Sit to Stand: Supervision           General transfer comment: no AD, from EOB and to chair, cues for reaching back to ensure safe eccentric control when sitting post-op    Ambulation/Gait Ambulation/Gait assistance: Contact guard assist Gait Distance (Feet): 100 Feet Assistive device: None Gait Pattern/deviations: Step-through pattern, Drifts right/left, Narrow base of support       General Gait Details: some R drift/decreased proprioception continued slowed gait speed but more stable and no overt LOB or buckling; distance limited within pt tolerance, multiple short laps in his room to achieve distance   Stairs Stairs: Yes Stairs assistance: Contact guard assist Stair Management: One rail Right, Step to pattern, Forwards, Backwards Number of Stairs: 16 General stair comments: forward ascending, backward descending single 7" step in room x16 reps with min cues for leading with RLE as he tends to bump L toes on step when leading with LLE but no buckling or significant instability when leading with either leg.   Wheelchair Mobility     Tilt Bed    Modified Rankin (Stroke Patients Only)       Balance Overall balance assessment: Needs assistance Sitting-balance support: Bilateral upper extremity supported, Single extremity supported, Feet supported Sitting balance-Leahy Scale: Fair Sitting balance - Comments: EOB   Standing balance support: During functional activity, No upper extremity supported Standing balance-Leahy Scale: Fair Standing balance comment: no buckling or significant LOB, just needs CGA to Supervision for safety due to bumping objects on R side intermittently  Cognition Arousal: Alert Behavior During Therapy: Flat affect Overall Cognitive Status: Within Functional  Limits for tasks assessed                                 General Comments: cues for safety, slightly decreased awareness of environment but pt very motivated to progress strength/endurance. Spouse present and agrees he would benefit from some AD (rollator) and able to provide some background info on home set-up.        Exercises      General Comments General comments (skin integrity, edema, etc.): drains and incision appear c/d/i, RN emptied both drains just prior to OOB with PTA      Pertinent Vitals/Pain Pain Assessment Pain Assessment: Faces Faces Pain Scale: Hurts a little bit Pain Location: abdomen Pain Descriptors / Indicators: Discomfort, Sore Pain Intervention(s): Monitored during session, Premedicated before session, Repositioned    Home Living                          Prior Function            PT Goals (current goals can now be found in the care plan section) Acute Rehab PT Goals Patient Stated Goal: 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 PT Goal Formulation: With patient Time For Goal Achievement: 05/12/23 Progress towards PT goals: Progressing toward goals    Frequency    Min 1X/week      PT Plan      Co-evaluation              AM-PAC PT "6 Clicks" Mobility   Outcome Measure  Help needed turning from your back to your side while in a flat bed without using bedrails?: A Little Help needed moving from lying on your back to sitting on the side of a flat bed without using bedrails?: A Little Help needed moving to and from a bed to a chair (including a wheelchair)?: A Little Help needed standing up from a chair using your arms (e.g., wheelchair or bedside chair)?: A Little Help needed to walk in hospital room?: A Little Help needed climbing 3-5 steps with a railing? : A Little 6 Click Score: 18    End of Session Equipment Utilized During Treatment: Gait belt Activity Tolerance: Patient tolerated treatment  well Patient left: in chair;with call bell/phone within reach;with chair alarm set;with family/visitor present (spouse also in room with him) Nurse Communication: Mobility status PT Visit Diagnosis: Unsteadiness on feet (R26.81);Other abnormalities of gait and mobility (R26.89);Other (comment) (decreased spatial awareness/decreased awareness of deficits)     Time: 0454-0981 PT Time Calculation (min) (ACUTE ONLY): 20 min  Charges:    $Gait Training: 8-22 mins PT General Charges $$ ACUTE PT VISIT: 1 Visit                     Sherissa Tenenbaum P., PTA Acute Rehabilitation Services Secure Chat Preferred 9a-5:30pm Office: [phone]    Angus Palms 05/04/2023, 7:36 PM

## 2023-05-04 NOTE — Plan of Care (Addendum)
Surgical dressing was changed due to being soiled. Split gauze and tegaderm placed over the site.   Problem: Education: Goal: Knowledge of General Education information will improve Description: Including pain rating scale, medication(s)/side effects and non-pharmacologic comfort measures Outcome: Progressing   Problem: Health Behavior/Discharge Planning: Goal: Ability to manage health-related needs will improve Outcome: Progressing   Problem: Clinical Measurements: Goal: Ability to maintain clinical measurements within normal limits will improve Outcome: Progressing   Problem: Activity: Goal: Risk for activity intolerance will decrease Outcome: Progressing   Problem: Nutrition: Goal: Adequate nutrition will be maintained Outcome: Progressing

## 2023-05-04 NOTE — Progress Notes (Addendum)
-  S/P WHIPPLE PROCEDURE 04/27/23    05/04/23 1539  TOC Brief Assessment  Insurance and Status Reviewed  Patient has primary care physician Yes  Home environment has been reviewed From home with wife  Prior level of function: PTA independent with ADL's, no DME usage  Prior/Current Home Services No current home services  Social Drivers of Health Review SDOH reviewed no interventions necessary  Readmission risk has been reviewed No  Transition of care needs no transition of care needs at this time   Per PT /OT recommendations: AIR, pt declined. TOC  team monitoring and will assist with needs as they present.  1/24 Pt/ wife agreeable to home health services , no provider preference.Referral made with [facility] and accepted pending MD's orders.  Gae Gallop RN,BSN,CM [phone]

## 2023-05-04 NOTE — Progress Notes (Signed)
7 Days Post-Op   Subjective/Chief Complaint: Tolerated some soft diet. No n/v.  Getting up and around. + flatus.      Objective: Vital signs in last 24 hours: Temp:  [98.2 F (36.8 C)-98.8 F (37.1 C)] 98.4 F (36.9 C) (01/23 0804) Pulse Rate:  [81-84] 83 (01/23 0804) Resp:  [16-19] 19 (01/23 1206) BP: (136-140)/(73-87) 139/84 (01/23 0804) SpO2:  [98 %-100 %] 98 % (01/23 1206) Last BM Date : 05/02/23  Intake/Output from previous day: 01/22 0701 - 01/23 0700 In: 300 [P.O.:300] Out: 770 [Urine:400; Drains:370] Intake/Output this shift: Total I/O In: 120 [P.O.:120] Out: 50 [Drains:50]  General appearance: alert, cooperative, and no distress Resp: breathing comfortably Cardio: regular rate and rhythm GI: soft, non distended. Approp tender.  Incision c/d/i.  Drain output minimally cloudy, but output in drain tubing appears cloudy Extremities: extremities normal, atraumatic, no cyanosis or edema  Lab Results:  Recent Labs    05/02/23 0454 05/04/23 0607  WBC 12.8* 8.3  HGB 11.7* 9.8*  HCT 35.0* 29.8*  PLT 263 299   BMET Recent Labs    05/03/23 0802 05/04/23 0607  NA 134* 131*  K 3.9 3.4*  CL 100 98  CO2 24 25  GLUCOSE 152* 148*  BUN 14 14  CREATININE 0.77 0.70  CALCIUM 9.2 8.5*   PT/INR No results for input(s): "LABPROT", "INR" in the last 72 hours.  ABG No results for input(s): "PHART", "HCO3" in the last 72 hours.  Invalid input(s): "PCO2", "PO2"   Studies/Results: No results found.  Anti-infectives: Anti-infectives (From admission, onward)    Start     Dose/Rate Route Frequency Ordered Stop   04/27/23 2100  ceFAZolin (ANCEF) IVPB 2g/100 mL premix        2 g 200 mL/hr over 30 Minutes Intravenous Every 8 hours 04/27/23 1559 04/27/23 2050   04/27/23 0700  ceFAZolin (ANCEF) IVPB 2g/100 mL premix        2 g 200 mL/hr over 30 Minutes Intravenous On call to O.R. 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  Negative margins.   PT/OT Tylenol -cont oxycodone, Robaxin  ABL anemia - stable.  Hypophosphatemia -resolved Hypomagnesemia-resolved H/o interstitial cystitis Klonopin for anxiety/sleep  DM- BS ok. continuous glucose monitor and ssi. Standing long acting insulin as well.   Ambulate  Incentive spirometry  Will keep an eye on drains.  Send u/a today.    Soft diet. Calorie counts.   LOS: 7 days    [name] 05/04/2023

## 2023-05-05 LAB — CBC
HCT: 30 % — ABNORMAL LOW (ref 39.0–52.0)
Hemoglobin: 9.8 g/dL — ABNORMAL LOW (ref 13.0–17.0)
MCH: 29.1 pg (ref 26.0–34.0)
MCHC: 32.7 g/dL (ref 30.0–36.0)
MCV: 89 fL (ref 80.0–100.0)
Platelets: 347 10*3/uL (ref 150–400)
RBC: 3.37 MIL/uL — ABNORMAL LOW (ref 4.22–5.81)
RDW: 13.3 % (ref 11.5–15.5)
WBC: 8.8 10*3/uL (ref 4.0–10.5)
nRBC: 0 % (ref 0.0–0.2)

## 2023-05-05 LAB — GLUCOSE, CAPILLARY
Glucose-Capillary: 133 mg/dL — ABNORMAL HIGH (ref 70–99)
Glucose-Capillary: 139 mg/dL — ABNORMAL HIGH (ref 70–99)
Glucose-Capillary: 152 mg/dL — ABNORMAL HIGH (ref 70–99)
Glucose-Capillary: 154 mg/dL — ABNORMAL HIGH (ref 70–99)
Glucose-Capillary: 174 mg/dL — ABNORMAL HIGH (ref 70–99)
Glucose-Capillary: 208 mg/dL — ABNORMAL HIGH (ref 70–99)

## 2023-05-05 LAB — AMYLASE, BODY FLUID (OTHER): Amylase, Body Fluid: 6 U/L

## 2023-05-05 MED ORDER — ENSURE ENLIVE PO LIQD
237.0000 mL | Freq: Two times a day (BID) | ORAL | Status: DC
Start: 2023-05-05 — End: 2023-05-10
  Administered 2023-05-05 – 2023-05-08 (×7): 237 mL via ORAL

## 2023-05-05 MED ORDER — PIPERACILLIN-TAZOBACTAM 3.375 G IVPB
3.3750 g | Freq: Four times a day (QID) | INTRAVENOUS | Status: DC
  Administered 2023-05-05 – 2023-05-06 (×4): 3.375 g via INTRAVENOUS
  Filled 2023-05-05 (×4): qty 50

## 2023-05-05 NOTE — Progress Notes (Signed)
8 Days Post-Op   Subjective/Chief Complaint: Doing a little more soft diet.  Ambulating.    Objective: Vital signs in last 24 hours: Temp:  [98.5 F (36.9 C)-99.1 F (37.3 C)] 98.5 F (36.9 C) (01/24 0821) Pulse Rate:  [84-94] 94 (01/24 0821) Resp:  [16] 16 (01/24 0821) BP: (143-145)/(74-81) 143/74 (01/24 0821) SpO2:  [97 %-98 %] 98 % (01/24 0821) Last BM Date : 05/02/23  Intake/Output from previous day: 01/23 0701 - 01/24 0700 In: 957 [P.O.:720] Out: 570 [Urine:200; Drains:370] Intake/Output this shift: Total I/O In: 240 [P.O.:240] Out: 150 [Drains:150]  General appearance: alert, cooperative, and no distress Resp: breathing comfortably Cardio: regular rate and rhythm GI: soft, non distended. Approp tender.  Incision c/d/i.  Drain #2 clearly milky today. Drain #1 more serous and minimally cloudy. Extremities: extremities normal, atraumatic, no cyanosis or edema  Lab Results:  Recent Labs    05/04/23 0607 05/05/23 0633  WBC 8.3 8.8  HGB 9.8* 9.8*  HCT 29.8* 30.0*  PLT 299 347   BMET Recent Labs    05/03/23 0802 05/04/23 0607  NA 134* 131*  K 3.9 3.4*  CL 100 98  CO2 24 25  GLUCOSE 152* 148*  BUN 14 14  CREATININE 0.77 0.70  CALCIUM 9.2 8.5*   PT/INR No results for input(s): "LABPROT", "INR" in the last 72 hours.  ABG No results for input(s): "PHART", "HCO3" in the last 72 hours.  Invalid input(s): "PCO2", "PO2"   Studies/Results: No results found.  Anti-infectives: Anti-infectives (From admission, onward)    Start     Dose/Rate Route Frequency Ordered Stop   05/05/23 1015  piperacillin-tazobactam (ZOSYN) IVPB 3.375 g        3.375 g 12.5 mL/hr over 240 Minutes Intravenous Every 6 hours 05/05/23 0917     04/27/23 2100  ceFAZolin (ANCEF) IVPB 2g/100 mL premix        2 g 200 mL/hr over 30 Minutes Intravenous Every 8 hours 04/27/23 1559 04/27/23 2050   04/27/23 0700  ceFAZolin (ANCEF) IVPB 2g/100 mL premix        2 g 200 mL/hr over 30  Minutes Intravenous On call to O.R. 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  Negative margins.   PT/OT Tylenol -cont oxycodone, Robaxin  ABL anemia - stable.  Hypophosphatemia -resolved Hypomagnesemia-resolved H/o interstitial cystitis Klonopin for anxiety/sleep  DM- BS ok. continuous glucose monitor and ssi. Standing long acting insulin as well.   Ambulate  Incentive spirometry  Amylase via drains pending. Started antibiotics for pancreatic leak.  Drain #1 may be serous only.     Soft diet. Calorie counts. If continues to improve and does well with calorie counts, hopefully home Sunday with at least one drain. Staples can come out Sunday/Monday if discharged that day.    LOS: 8 days    [name] 05/05/2023

## 2023-05-05 NOTE — Progress Notes (Signed)
Pharmacy Antibiotic Note  Andrew Tanner is a 73 y.o. male admitted on 04/27/2023 with  Intra-abdominal infection .  Pharmacy has been consulted for Zosyn dosing.  Plan: Zosyn 3.375g IV q8h (4 hour infusion). Monitor clinical progress, cultures/sensitivities, renal function, abx plan   Height: 5\' 11"  (180.3 cm) Weight: 80.7 kg (178 lb) IBW/kg (Calculated) : 75.3  Temp (24hrs), Avg:98.6 F (37 C), Min:98.2 F (36.8 C), Max:99.1 F (37.3 C)  Recent Labs  Lab 04/30/23 0431 05/01/23 0435 05/02/23 0454 05/03/23 0802 05/04/23 0607 05/05/23 0633  WBC 6.4 5.4 12.8*  --  8.3 8.8  CREATININE 1.10 0.87 0.79 0.77 0.70  --     Estimated Creatinine Clearance: 87.6 mL/min (by C-G formula based on SCr of 0.7 mg/dL).    No Known Allergies  Antimicrobials this admission: 1/24 Zosyn >>   Dose adjustments this admission:  Microbiology results: 1/23 Peritoneal fluid Cx: mod GPC chains, mod GNR, few GPC clusters 1/16 MRSA PCR: detected    Thank you for allowing pharmacy to be a part of this patient's care.   Signe Colt, PharmD 05/05/2023 9:19 AM  **Pharmacist phone directory can be found on [email or web address] listed under [facility]**

## 2023-05-05 NOTE — Progress Notes (Signed)
Initial Nutrition Assessment  DOCUMENTATION CODES:   Non-severe (moderate) malnutrition in context of chronic illness  INTERVENTION:  Continue calorie count throughout the weekend to assess nutritional adequacy; meal tickets to be retrieved on Monday Continue Ensure Enlive po BID, each supplement provides 350 kcal and 20 grams of protein or Boost Plus po BID, each supplement provides 360 kcal and 14 grams of protein per pt preference Small protein containing snacks between meals "Diet and Nutrition after a Whipple Procedure" handout provided and discussed with patient Referral placed for outpatient nutrition follow up given high nutrition risk  NUTRITION DIAGNOSIS:   Moderate Malnutrition related to chronic illness as evidenced by moderate fat depletion, moderate muscle depletion, severe muscle depletion  GOAL:   Patient will meet greater than or equal to 90% of their needs  MONITOR:   PO intake, Supplement acceptance, Labs, Weight trends  REASON FOR ASSESSMENT:   Consult Calorie Count  ASSESSMENT:   Pt admitted with presence of duodenal adenoma pancreatic cystic lesion and planned whipple procedure on 1/16. PMH significant for DM, HLD, HTN  Spoke with pt at bedside. He is in good spirits and very pleasant. He reports that he transitioned to consuming 6 small meals daily a few months ago to better manage his blood sugars. This is also when he started using insulin regularly. Overall up until admission, his appetite has remained stable. He endorses starting to exercise regularly about 3-4 months ago via stretches and 2 walks daily.   Pt does not consume nutrition supplements at home however since admission, his wife has purchased them for home use. He typically take a Men's One a Day MVI.   During admission he reports that his appetite has been poor given recent Whipple. Yesterday is the first day he felt that he could tolerate increased PO intake. Dietary recall from yesterday  obtained through pt as there was 1 meal ticket retained in envelope.   1/23: ~802 kcal (38% of minimum estimated kcal needs) and 52g protein (50% minimum estimated protein needs) consumed total   This morning he consumed 50% of an english muffin and 50% oatmeal and 100% greek yogurt (277 kcal and 13g protein).   Pt reports that his weight has been stable around 170 lbs although feels that his weight has decreased about 5 lbs during admission. He suspects that muscle losses are d/t poorly controlled DM prior to initiation of insulin. However per chart review, noted pt had been on a liquid diet following prior EGD which led to about a 10 lb weight loss.   Medications: colace BID, SSI 0-15 units q4h, semglee 10 units daily, protonix, klor-con, IV abx  Labs (1/31):  Sodium 131 Potassium 3.4 (repletion ordered) CBG's 122-174 x24 hours HgbA1c 6.8% (04/21/23)  Drains: R JP drain 1: x24 hours R JP drain 2: x24 hours  NUTRITION - FOCUSED PHYSICAL EXAM:  Flowsheet Row Most Recent Value  Orbital Region No depletion  Upper Arm Region Severe depletion  Thoracic and Lumbar Region Moderate depletion  Buccal Region Moderate depletion  Temple Region Mild depletion  Clavicle Bone Region Moderate depletion  Clavicle and Acromion Bone Region No depletion  Scapular Bone Region Moderate depletion  Dorsal Hand Moderate depletion  Patellar Region Severe depletion  Anterior Thigh Region Severe depletion  Posterior Calf Region Severe depletion  Edema (RD Assessment) None  Hair Reviewed  Eyes Reviewed  Mouth Reviewed  Skin Reviewed  Nails Reviewed       Diet Order:   Diet  Order             DIET SOFT Room service appropriate? Yes; Fluid consistency: Thin  Diet effective now                   EDUCATION NEEDS:   Education needs have been addressed  Skin:  Skin Assessment: Skin Integrity Issues: Skin Integrity Issues:: Incisions Incisions: closed abdominal incision  Last  BM:  1/21  Height:   Ht Readings from Last 1 Encounters:  04/27/23 5\' 11"  (1.803 m)    Weight:   Wt Readings from Last 1 Encounters:  04/27/23 80.7 kg   BMI:  Body mass index is 24.83 kg/m.  Estimated Nutritional Needs:   Kcal:  2100-2300  Protein:  105-120g  Fluid:  >/=2L  Drusilla Kanner, RDN, LDN Clinical Nutrition

## 2023-05-05 NOTE — Progress Notes (Signed)
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  S/P whipple procedure. 1/20 removed the epidural. PMHx: Basal cell carcinoma, depression, DM, diabetic neuropathy, HTN, IC, Neuromuscular d/o/peripheral neuropathy    PT Comments  Pt received in supine, pleasantly agreeable to therapy session and with good participation and improving tolerance for transfer and gait training. Using rollator, pt able to increase gait distance with improving insight into his endurance and needing conserve energy. Pt agreeable to rollator DME upon DC and HHPT per discussion. Pt needs CGA at most to perform bed mobility and gait, but mostly Supervision when using AD. Pt scored 12/24 on Dynamic Gait Index. Scores of 19 or less are predictive of falls in older community living adults. Pt continues to benefit from PT services to progress toward functional mobility goals.     If plan is discharge home, recommend the following: Assistance with cooking/housework;Assist for transportation;A little help with walking and/or transfers;A little help with bathing/dressing/bathroom;Help with stairs or ramp for entrance   Can travel by private vehicle        Equipment Recommendations  Rollator (4 wheels)    Recommendations for Other Services       Precautions / Restrictions Precautions Precautions: Fall Precaution Comments: x2 JP drains R side Restrictions Weight Bearing Restrictions Per Provider Order: No     Mobility  Bed Mobility Overal bed mobility: Needs Assistance Bed Mobility: Sidelying to Sit, Rolling, Sit to Sidelying Rolling: Modified independent (Device/Increase time) Sidelying to sit: Contact guard assist, Used rails     Sit to sidelying: Contact guard assist General bed mobility comments: cues  for log roll    Transfers Overall transfer level: Needs assistance Equipment used: Rollator (4 wheels) Transfers: Sit to/from Stand Sit to Stand: Supervision           General transfer comment: cues for use of rollator brakes    Ambulation/Gait Ambulation/Gait assistance: Supervision Gait Distance (Feet): 400 Feet (x1 seated rest break on rollator) Assistive device: Rollator (4 wheels) Gait Pattern/deviations: Step-through pattern, Drifts right/left       General Gait Details: some R drift, slow cadence, pt able to correct to center of hallway with cues but again tends to drift slowly to R when not cued.   Stairs Stairs: Yes Stairs assistance: Supervision Stair Management: Forwards, One rail Left, Step to pattern Number of Stairs: 5 General stair comments: steps of varying heights in PT gym   Wheelchair Mobility     Tilt Bed    Modified Rankin (Stroke Patients Only)       Balance Overall balance assessment: Needs assistance Sitting-balance support: Single extremity supported, Feet supported, No upper extremity supported Sitting balance-Leahy Scale: Good Sitting balance - Comments: EOB   Standing balance support: During functional activity, No upper extremity supported Standing balance-Leahy Scale: Fair Standing balance comment: no buckling or significant LOB, very stable with rollator, mildly unsteady still without AD                 Standardized Balance Assessment Standardized Balance Assessment : Dynamic Gait Index   Dynamic Gait Index Level Surface: Moderate Impairment Change in Gait Speed: Moderate Impairment Gait with Horizontal Head Turns: Mild Impairment Gait with Vertical Head Turns: Mild Impairment Gait and Pivot Turn: Mild Impairment Step Over Obstacle: Mild Impairment Step Around Obstacles: Moderate Impairment  Steps: Moderate Impairment Total Score: 12      Cognition Arousal: Alert Behavior During Therapy: WFL for tasks  assessed/performed Overall Cognitive Status: Within Functional Limits for tasks assessed                                 General Comments: Pt pleasantly agreeable to therapies and agrees having DME will be a good idea this date (rollator) and able to indicate when he needs rest breaks.        Exercises      General Comments General comments (skin integrity, edema, etc.): x2 drains appear c/d/i      Pertinent Vitals/Pain Pain Assessment Pain Assessment: Faces Faces Pain Scale: Hurts a little bit Pain Location: abdomen Pain Descriptors / Indicators: Discomfort, Sore Pain Intervention(s): Limited activity within patient's tolerance, Monitored during session, Repositioned    Home Living                          Prior Function            PT Goals (current goals can now be found in the care plan section) Acute Rehab PT Goals Patient Stated Goal: 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 PT Goal Formulation: With patient Time For Goal Achievement: 05/12/23 Progress towards PT goals: Progressing toward goals    Frequency    Min 1X/week      PT Plan      Co-evaluation              AM-PAC PT "6 Clicks" Mobility   Outcome Measure  Help needed turning from your back to your side while in a flat bed without using bedrails?: None Help needed moving from lying on your back to sitting on the side of a flat bed without using bedrails?: A Little Help needed moving to and from a bed to a chair (including a wheelchair)?: A Little Help needed standing up from a chair using your arms (e.g., wheelchair or bedside chair)?: A Little Help needed to walk in hospital room?: A Little Help needed climbing 3-5 steps with a railing? : A Little 6 Click Score: 19    End of Session Equipment Utilized During Treatment: Gait belt Activity Tolerance: Patient tolerated treatment well Patient left: with call bell/phone within reach;in bed;with bed alarm  set;Other (comment) (pillow to float heels and offload lower back/shoulder) Nurse Communication: Mobility status PT Visit Diagnosis: Unsteadiness on feet (R26.81);Other abnormalities of gait and mobility (R26.89);Other (comment) (decreased spatial awareness/decreased awareness of deficits)     Time: [phone] PT Time Calculation (min) (ACUTE ONLY): 28 min  Charges:    $Gait Training: 23-37 mins PT General Charges $$ ACUTE PT VISIT: 1 Visit                     Dashun Borre P., PTA Acute Rehabilitation Services Secure Chat Preferred 9a-5:30pm Office: [phone]    Dorathy Kinsman [facility] 05/05/2023, 7:00 PM

## 2023-05-05 NOTE — Progress Notes (Signed)
Mobility Specialist Progress Note:    05/05/23 1201  Mobility  Activity Ambulated with assistance in hallway  Level of Assistance Contact guard assist, steadying assist  Assistive Device Front wheel walker  Distance Ambulated (ft) 250 ft  Activity Response Tolerated well  Mobility Referral Yes  Mobility visit 1 Mobility  Mobility Specialist Start Time (ACUTE ONLY) 1020  Mobility Specialist Stop Time (ACUTE ONLY) 1034  Mobility Specialist Time Calculation (min) (ACUTE ONLY) 14 min   Received pt in bed having no complaints and agreeable to mobility. Pt was asymptomatic throughout ambulation and returned to room w/o fault. Left in bed w/ call bell in reach and all needs met.   Thompson Grayer Mobility Specialist  Please contact vis Secure Chat or  Rehab Office [phone]

## 2023-05-05 NOTE — Inpatient Diabetes Management (Addendum)
Inpatient Diabetes Program Recommendations  AACE/ADA: New Consensus Statement on Inpatient Glycemic Control (2015)  Target Ranges:  Prepandial:   less than 140 mg/dL      Peak postprandial:   less than 180 mg/dL (1-2 hours)      Critically ill patients:  140 - 180 mg/dL   Lab Results  Component Value Date   GLUCAP 174 (H) 05/05/2023   HGBA1C 6.8 (H) 04/21/2023    Review of Glycemic Control  Latest Reference Range & Units 05/04/23 12:44 05/04/23 16:08 05/04/23 20:47 05/04/23 23:59 05/05/23 03:59 05/05/23 08:56 05/05/23 11:41  Glucose-Capillary 70 - 99 mg/dL 811 (H) 914 (H) 782 (H) 154 (H) 139 (H) 152 (H) 174 (H)  (H): Data is abnormally high  Diabetes history: DM2 Outpatient Diabetes medications: Lantus 10 units at HS, Lyumjev insulin 3-6 units scale per blood sugar at lunch and supper, Jardiance 25 mg daily, Metformin 1000 mg BID Current orders for Inpatient glycemic control: Semglee 10 units at HS, Novolog 0-15 units correction scale every 4 hours  Inpatient Diabetes Program Recommendations:    Referral for DC recommendations.  Recommend discharging on current home regimen.  Patient is eating.  Might consider:  Novolog 0-15 units TID and 0-5 units at bedtime  Will continue to follow while inpatient.  Thank you, Dulce Sellar, MSN, CDCES Diabetes Coordinator Inpatient Diabetes Program [phone] (team pager from 8a-5p)

## 2023-05-06 LAB — CBC
HCT: 31.6 % — ABNORMAL LOW (ref 39.0–52.0)
Hemoglobin: 10.1 g/dL — ABNORMAL LOW (ref 13.0–17.0)
MCH: 28.9 pg (ref 26.0–34.0)
MCHC: 32 g/dL (ref 30.0–36.0)
MCV: 90.5 fL (ref 80.0–100.0)
Platelets: 407 10*3/uL — ABNORMAL HIGH (ref 150–400)
RBC: 3.49 MIL/uL — ABNORMAL LOW (ref 4.22–5.81)
RDW: 13.4 % (ref 11.5–15.5)
WBC: 7.7 10*3/uL (ref 4.0–10.5)
nRBC: 0 % (ref 0.0–0.2)

## 2023-05-06 LAB — GLUCOSE, CAPILLARY
Glucose-Capillary: 160 mg/dL — ABNORMAL HIGH (ref 70–99)
Glucose-Capillary: 161 mg/dL — ABNORMAL HIGH (ref 70–99)
Glucose-Capillary: 167 mg/dL — ABNORMAL HIGH (ref 70–99)
Glucose-Capillary: 179 mg/dL — ABNORMAL HIGH (ref 70–99)
Glucose-Capillary: 182 mg/dL — ABNORMAL HIGH (ref 70–99)
Glucose-Capillary: 192 mg/dL — ABNORMAL HIGH (ref 70–99)

## 2023-05-06 MED ORDER — PIPERACILLIN-TAZOBACTAM 3.375 G IVPB
3.3750 g | Freq: Three times a day (TID) | INTRAVENOUS | Status: DC
  Administered 2023-05-06 – 2023-05-08 (×6): 3.375 g via INTRAVENOUS
  Filled 2023-05-06 (×6): qty 50

## 2023-05-06 MED ORDER — SIMETHICONE 80 MG PO CHEW
80.0000 mg | CHEWABLE_TABLET | Freq: Four times a day (QID) | ORAL | Status: DC | PRN
  Administered 2023-05-06 – 2023-05-07 (×2): 80 mg via ORAL
  Filled 2023-05-06 (×2): qty 1

## 2023-05-06 NOTE — Progress Notes (Signed)
9 Days Post-Op   Subjective/Chief Complaint: Tolerating more PO intake without nausea or vomiting No BM yet Drain #1 - still appears cloudy; 270 cc Drain #2 - serous; Amylase level 6; 300 cc   Objective: Vital signs in last 24 hours: Temp:  [97.9 F (36.6 C)-98.6 F (37 C)] 98.3 F (36.8 C) (01/25 0811) Pulse Rate:  [80-96] 80 (01/25 0811) Resp:  [16-17] 16 (01/25 0811) BP: (139-159)/(79-88) 144/82 (01/25 0811) SpO2:  [94 %-99 %] 98 % (01/25 0811) Last BM Date : 05/02/23  Intake/Output from previous day: 01/24 0701 - 01/25 0700 In: 960 [P.O.:960] Out: 1670 [Urine:1100; Drains:570] Intake/Output this shift: No intake/output data recorded.  General appearance: alert, cooperative, and no distress Resp: breathing comfortably Cardio: regular rate and rhythm GI: soft, non distended. Approp tender.  Incision c/d/i.   Drain #1 clearly milky today. Drain #2 serous  Extremities: extremities normal, atraumatic, no cyanosis or edema  Lab Results:  Recent Labs    05/05/23 0633 05/06/23 0616  WBC 8.8 7.7  HGB 9.8* 10.1*  HCT 30.0* 31.6*  PLT 347 407*   BMET Recent Labs    05/04/23 0607  NA 131*  K 3.4*  CL 98  CO2 25  GLUCOSE 148*  BUN 14  CREATININE 0.70  CALCIUM 8.5*    Anti-infectives: Anti-infectives (From admission, onward)    Start     Dose/Rate Route Frequency Ordered Stop   05/06/23 1200  piperacillin-tazobactam (ZOSYN) IVPB 3.375 g        3.375 g 12.5 mL/hr over 240 Minutes Intravenous Every 8 hours 05/06/23 0922     05/05/23 1015  piperacillin-tazobactam (ZOSYN) IVPB 3.375 g  Status:  Discontinued        3.375 g 12.5 mL/hr over 240 Minutes Intravenous Every 6 hours 05/05/23 0917 05/06/23 0922   04/27/23 2100  ceFAZolin (ANCEF) IVPB 2g/100 mL premix        2 g 200 mL/hr over 30 Minutes Intravenous Every 8 hours 04/27/23 1559 04/27/23 2050   04/27/23 0700  ceFAZolin (ANCEF) IVPB 2g/100 mL premix        2 g 200 mL/hr over 30 Minutes Intravenous On  call to O.R. 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  Negative margins.    PT/OT Tylenol -cont oxycodone, Robaxin   ABL anemia - improving.  Hypophosphatemia -resolved Hypomagnesemia-resolved H/o interstitial cystitis Klonopin for anxiety/sleep   DM- BS ok. continuous glucose monitor and ssi. Standing long acting insulin as well.    Ambulate  Incentive spirometry   Amylase via drains pending. Started antibiotics for pancreatic leak.  Drain #2 may be serous only - amylase 6      Soft diet. Calorie counts. If continues to improve and does well with calorie counts, hopefully home Sunday with at least drain #2. Staples can come out Sunday/Monday if discharged that day.      LOS: 9 days    Andrew Tanner 05/06/2023

## 2023-05-06 NOTE — Progress Notes (Signed)
Mobility Specialist Progress Note:    05/06/23 1400  Mobility  Activity Ambulated with assistance in hallway  Level of Assistance Standby assist, set-up cues, supervision of patient - no hands on  Assistive Device Front wheel walker  Distance Ambulated (ft) 250 ft  Activity Response Tolerated well  Mobility Referral Yes  Mobility visit 1 Mobility  Mobility Specialist Start Time (ACUTE ONLY) 1438  Mobility Specialist Stop Time (ACUTE ONLY) 1448  Mobility Specialist Time Calculation (min) (ACUTE ONLY) 10 min   Pt received on EOB and agreeable. Required no physical assistance this date. C/o some fatigue nearing EOS. Pt left in bed with call bell and all needs met.  Andrew Tanner Mobility Specialist Please contact via Special educational needs teacher or Rehab office at [phone]

## 2023-05-07 LAB — BODY FLUID CULTURE W GRAM STAIN: Culture: NO GROWTH

## 2023-05-07 LAB — GLUCOSE, CAPILLARY
Glucose-Capillary: 116 mg/dL — ABNORMAL HIGH (ref 70–99)
Glucose-Capillary: 125 mg/dL — ABNORMAL HIGH (ref 70–99)
Glucose-Capillary: 133 mg/dL — ABNORMAL HIGH (ref 70–99)
Glucose-Capillary: 154 mg/dL — ABNORMAL HIGH (ref 70–99)
Glucose-Capillary: 156 mg/dL — ABNORMAL HIGH (ref 70–99)
Glucose-Capillary: 89 mg/dL (ref 70–99)

## 2023-05-07 NOTE — Progress Notes (Signed)
10 Days Post-Op   Subjective/Chief Complaint: Tolerating PO intake without nausea or vomiting. Passing some flatus. Not eating much according to patient and wife. Maybe some yogurt and ensure yesterday. Pain stable - had an episode of pain overnight which improved with pain medications  Drain #1 - still appears cloudy; Amylase pending. 140 cc Drain #2 - serous; Amylase level 6; 245 cc  Wife at bedside  Objective: Vital signs in last 24 hours: Temp:  [98 F (36.7 C)-98.5 F (36.9 C)] 98.5 F (36.9 C) (01/26 0419) Pulse Rate:  [80-90] 86 (01/26 0419) Resp:  [16-18] 18 (01/26 0419) BP: (135-141)/(82-91) 141/91 (01/26 0419) SpO2:  [97 %-98 %] 98 % (01/26 0708) Last BM Date : 05/02/23  Intake/Output from previous day: 01/25 0701 - 01/26 0700 In: 73.7 [I.V.:42.4; IV Piggyback:31.3] Out: 1010 [Urine:625; Drains:385] Intake/Output this shift: Total I/O In: -  Out: 120 [Drains:120]  General appearance: alert, cooperative, and no distress Resp: breathing comfortably Cardio: regular rate and rhythm GI: soft, non distended. Approp tender.  Incision c/d/i.   Drain #1 cloudy today. Drain #2 serous  Extremities: extremities normal, atraumatic, no cyanosis or edema  Lab Results:  Recent Labs    05/05/23 0633 05/06/23 0616  WBC 8.8 7.7  HGB 9.8* 10.1*  HCT 30.0* 31.6*  PLT 347 407*   BMET No results for input(s): "NA", "K", "CL", "CO2", "GLUCOSE", "BUN", "CREATININE", "CALCIUM" in the last 72 hours.   Anti-infectives: Anti-infectives (From admission, onward)    Start     Dose/Rate Route Frequency Ordered Stop   05/06/23 1200  piperacillin-tazobactam (ZOSYN) IVPB 3.375 g        3.375 g 12.5 mL/hr over 240 Minutes Intravenous Every 8 hours 05/06/23 0922     05/05/23 1015  piperacillin-tazobactam (ZOSYN) IVPB 3.375 g  Status:  Discontinued        3.375 g 12.5 mL/hr over 240 Minutes Intravenous Every 6 hours 05/05/23 0917 05/06/23 0922   04/27/23 2100  ceFAZolin (ANCEF) IVPB  2g/100 mL premix        2 g 200 mL/hr over 30 Minutes Intravenous Every 8 hours 04/27/23 1559 04/27/23 2050   04/27/23 0700  ceFAZolin (ANCEF) IVPB 2g/100 mL premix        2 g 200 mL/hr over 30 Minutes Intravenous On call to O.R. 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  Negative margins.    PT/OT Tylenol -cont oxycodone, Robaxin   ABL anemia - improving.  Hypophosphatemia -resolved Hypomagnesemia-resolved H/o interstitial cystitis Klonopin for anxiety/sleep   DM- BS ok. continuous glucose monitor and ssi. Standing long acting insulin as well.    Ambulate  Incentive spirometry   Amylase for drain 1 pending. Started antibiotics for pancreatic leak.  Drain #2 may be serous only - amylase 6      Soft diet. Calorie counts. Continue cal counts today and maybe home tomorrow if PO intake improving. He will have more support as of Monday as well since his daughter is coming to town. Staples can come out Monday if discharged that day.      LOS: 10 days   Eric Form, [facility] 05/07/2023, 12:48 PM Please see Amion for pager number during day hours 7:00am-4:30pm

## 2023-05-07 NOTE — Plan of Care (Signed)

## 2023-05-08 DIAGNOSIS — E440 Moderate protein-calorie malnutrition: Secondary | ICD-10-CM | POA: Insufficient documentation

## 2023-05-08 LAB — BODY FLUID CULTURE W GRAM STAIN

## 2023-05-08 LAB — GLUCOSE, CAPILLARY
Glucose-Capillary: 108 mg/dL — ABNORMAL HIGH (ref 70–99)
Glucose-Capillary: 111 mg/dL — ABNORMAL HIGH (ref 70–99)
Glucose-Capillary: 124 mg/dL — ABNORMAL HIGH (ref 70–99)
Glucose-Capillary: 126 mg/dL — ABNORMAL HIGH (ref 70–99)
Glucose-Capillary: 140 mg/dL — ABNORMAL HIGH (ref 70–99)
Glucose-Capillary: 144 mg/dL — ABNORMAL HIGH (ref 70–99)
Glucose-Capillary: 78 mg/dL (ref 70–99)

## 2023-05-08 MED ORDER — SODIUM CHLORIDE 0.9 % IV SOLN
2.0000 g | INTRAVENOUS | Status: DC
  Administered 2023-05-08 – 2023-05-10 (×3): 2 g via INTRAVENOUS
  Filled 2023-05-08 (×3): qty 20

## 2023-05-08 NOTE — Progress Notes (Signed)
26 staples removed from abd midline incision site. Pt tolerated with no problem. Site closed, intact and with no signs of infection. JP drain dressing changed.

## 2023-05-08 NOTE — Progress Notes (Signed)
11 Days Post-Op   Subjective/Chief Complaint: Ate most of his breakfast this morning, but only had small bites of meals yesterday. Does not appear a calorie count was documented yesterday. Started having diarrhea overnight. Afebrile, vitals stable.  Objective: Vital signs in last 24 hours: Temp:  [98 F (36.7 C)-98.7 F (37.1 C)] 98.4 F (36.9 C) (01/27 0718) Pulse Rate:  [86-100] 91 (01/27 0718) Resp:  [17-19] 18 (01/27 0718) BP: (137-150)/(81-88) 147/82 (01/27 0718) SpO2:  [98 %-100 %] 99 % (01/27 0718) Last BM Date : 05/02/23  Intake/Output from previous day: 01/26 0701 - 01/27 0700 In: -  Out: 560 [Urine:100; Drains:460] Intake/Output this shift: No intake/output data recorded.  General appearance: alert, cooperative, and no distress Resp: breathing comfortably on room air GI: soft, non distended, incision clean and dry with staples in place. JP#1 with cloudy brown fluid, JP#2 serous. Extremities: extremities normal, atraumatic, no cyanosis or edema  Lab Results:  Recent Labs    05/06/23 0616  WBC 7.7  HGB 10.1*  HCT 31.6*  PLT 407*   BMET No results for input(s): "NA", "K", "CL", "CO2", "GLUCOSE", "BUN", "CREATININE", "CALCIUM" in the last 72 hours.   Anti-infectives: Anti-infectives (From admission, onward)    Start     Dose/Rate Route Frequency Ordered Stop   05/08/23 0930  cefTRIAXone (ROCEPHIN) 2 g in sodium chloride 0.9 % 100 mL IVPB        2 g 200 mL/hr over 30 Minutes Intravenous Every 24 hours 05/08/23 0844 05/12/23 0929   05/06/23 1200  piperacillin-tazobactam (ZOSYN) IVPB 3.375 g  Status:  Discontinued        3.375 g 12.5 mL/hr over 240 Minutes Intravenous Every 8 hours 05/06/23 0922 05/08/23 0844   05/05/23 1015  piperacillin-tazobactam (ZOSYN) IVPB 3.375 g  Status:  Discontinued        3.375 g 12.5 mL/hr over 240 Minutes Intravenous Every 6 hours 05/05/23 0917 05/06/23 0922   04/27/23 2100  ceFAZolin (ANCEF) IVPB 2g/100 mL premix        2  g 200 mL/hr over 30 Minutes Intravenous Every 8 hours 04/27/23 1559 04/27/23 2050   04/27/23 0700  ceFAZolin (ANCEF) IVPB 2g/100 mL premix        2 g 200 mL/hr over 30 Minutes Intravenous On call to O.R. 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  Negative margins.    PT/OT Tylenol -cont oxycodone, Robaxin H/o interstitial cystitis Klonopin for anxiety/sleep   DM- blood sugars well-controlled. continuous glucose monitor and ssi. Standing long acting insulin as well.    Ambulate  Incentive spirometry  Diarrhea - Just started in last 24 hours, patient has not noticed oily or orange stools. Monitor, may need to start Creon if this persists.   Amylase for drain 1 pending, but appearance is consistent with a pancreatic leak. Started antibiotics last week, drain cultures with Klebsiella. Transition to ceftriaxone today based on sensitivities.  Drain#2 is serous with low amylase - drain removed this morning.  Remove staples from incision today.   Soft diet. Calorie counts. Patient not ready for discharge today given frequent diarrhea. Possible discharge tomorrow if PO intake continues to improve.     LOS: 11 days   Fritzi Mandes, MD [facility] Surgery 05/08/2023, 8:45 AM Please see Amion for pager  number during day hours 7:00am-4:30pm

## 2023-05-08 NOTE — Progress Notes (Signed)
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  S/P whipple procedure. 1/20 removed the epidural. PMHx: Basal cell carcinoma, depression, DM, diabetic neuropathy, HTN, IC, Neuromuscular d/o/peripheral neuropathy   OT comments  Patient continue to make good gains with OT treatment with bed mobility, functional transfers, and self care. Patient required cues for safety with toilet transfers and felt more comfortable performing self care tasks seated over standing. Patient will benefit from intensive inpatient follow up therapy, >3 hours/day with acute OT to continue to follow to address established goals to facilitate DC to next venue of care.       If plan is discharge home, recommend the following:  A little help with walking and/or transfers;A little help with bathing/dressing/bathroom   Equipment Recommendations  BSC/3in1;Other (comment) (RW)    Recommendations for Other Services      Precautions / Restrictions Precautions Precautions: Fall Precaution Comments: x2 JP drains R side Restrictions Weight Bearing Restrictions Per Provider Order: No       Mobility Bed Mobility Overal bed mobility: Needs Assistance Bed Mobility: Sidelying to Sit   Sidelying to sit: Contact guard assist, Used rails       General bed mobility comments: cues only    Transfers Overall transfer level: Needs assistance Equipment used: Rolling walker (2 wheels) Transfers: Sit to/from Stand Sit to Stand: Supervision           General transfer comment: CGA for toilet transfers for safety     Balance Overall balance assessment: Needs assistance Sitting-balance support: Single extremity supported, Feet supported, No upper extremity supported Sitting balance-Leahy Scale:  Good Sitting balance - Comments: EOB   Standing balance support: During functional activity, No upper extremity supported Standing balance-Leahy Scale: Fair Standing balance comment: able to stand with no UE support during hand washing at sink                           ADL either performed or assessed with clinical judgement   ADL Overall ADL's : Needs assistance/impaired Eating/Feeding: Modified independent;Sitting   Grooming: Wash/dry hands;Wash/dry face;Oral care;Set up;Sitting Grooming Details (indicate cue type and reason): declined performing while standing Upper Body Bathing: Set up;Sitting       Upper Body Dressing : Supervision/safety;Sitting Upper Body Dressing Details (indicate cue type and reason): to change gowns     Toilet Transfer: Contact guard assist;Ambulation;Regular Toilet;Rolling walker (2 wheels) Toilet Transfer Details (indicate cue type and reason): cues for safety Toileting- Clothing Manipulation and Hygiene: Supervision/safety;Sitting/lateral lean;Minimal assistance;Sit to/from stand Toileting - Clothing Manipulation Details (indicate cue type and reason): performed toilet hygiene seated and required assistance to complete while standing     Functional mobility during ADLs: Contact guard assist;Rolling walker (2 wheels)      Extremity/Trunk Assessment              Vision       Perception     Praxis      Cognition Arousal: Alert Behavior During Therapy: WFL for tasks assessed/performed Overall Cognitive Status: Within Functional Limits for tasks assessed  Exercises      Shoulder Instructions       General Comments      Pertinent Vitals/ Pain       Pain Assessment Pain Assessment: Faces Faces Pain Scale: Hurts a little bit Pain Location: abdomen Pain Descriptors / Indicators: Discomfort, Sore Pain Intervention(s): Limited activity within patient's tolerance,  Monitored during session, Repositioned  Home Living                                          Prior Functioning/Environment              Frequency  Min 1X/week        Progress Toward Goals  OT Goals(current goals can now be found in the care plan section)  Progress towards OT goals: Progressing toward goals  Acute Rehab OT Goals Patient Stated Goal: get better OT Goal Formulation: With patient Time For Goal Achievement: 05/15/23 Potential to Achieve Goals: Good ADL Goals Pt Will Perform Grooming: with modified independence;sitting Pt Will Perform Upper Body Dressing: with modified independence;sitting Pt Will Perform Lower Body Dressing: with modified independence;sit to/from stand Pt Will Transfer to Toilet: with supervision;ambulating;bedside commode Additional ADL Goal #1: pt will demonstrate dyanamic balance with sink level grooming supervision level  Plan      Co-evaluation                 AM-PAC OT "6 Clicks" Daily Activity     Outcome Measure   Help from another person eating meals?: A Little Help from another person taking care of personal grooming?: A Little Help from another person toileting, which includes using toliet, bedpan, or urinal?: A Little Help from another person bathing (including washing, rinsing, drying)?: A Little Help from another person to put on and taking off regular upper body clothing?: A Little Help from another person to put on and taking off regular lower body clothing?: A Little 6 Click Score: 18    End of Session Equipment Utilized During Treatment: Rolling walker (2 wheels)  OT Visit Diagnosis: Unsteadiness on feet (R26.81);Muscle weakness (generalized) (M62.81)   Activity Tolerance Patient tolerated treatment well   Patient Left in chair;with call bell/phone within reach;with chair alarm set   Nurse Communication Mobility status;Precautions        Time: 2952-8413 OT Time Calculation (min):  26 min  Charges: OT General Charges $OT Visit: 1 Visit OT Treatments $Self Care/Home Management : 23-37 mins  Alfonse Flavors, OTA Acute Rehabilitation Services  Office [phone]   Dewain Penning 05/08/2023, 11:36 AM

## 2023-05-08 NOTE — Progress Notes (Signed)
Calorie Count Note  48 hour calorie count started 1/23-1/26.  Diet: GI Soft Supplements: Ensure Enlive or Boost Plus BID, Snacks TID  Estimated Nutritional Needs:  Kcal:  2100-2300 Protein:  105-120g Fluid:  >/=2L  1 meal ticket retrieved from calorie count envelope on 1/24 for dinner which reflects pt consumed 18 kcal and 2g protein  Went over meals ordered with pt and he attempted to recall intake to the best of his ability though suspect not to be completely accurate given difficulty with differentiating which days he consumed different items. Pt reports that his intake had been "spotty" over the weekend d/t gas and bloating.   Day 1- 1/25: Breakfast: none ordered Lunch: 168 kcal and 4g protein Dinner: pt cannot recall Snacks: 110 kcal (1c peaches, 1c pears) Supplements: 700 kcal and 40g protein  Total intake: ~978 kcal (47% of minimum estimated needs)  ~44g protein (42% of minimum estimated needs)  Day 2- 1/26: Breakfast: none ordered Lunch: 294 kcal and 17g protein Dinner: 112 kcal and 10g protein Snacks: 110 kcal (1c peaches, 1c pears) Supplements: 700 kcal and 40g protein  Total intake: ~1216 kcal (58% of minimum estimated needs)  ~67g protein (64% of minimum estimated needs)  Nutrition Dx:  Moderate Malnutrition related to chronic illness as evidenced by moderate fat depletion, moderate muscle depletion, severe muscle depletion   Goal:  Patient will meet greater than or equal to 90% of their needs   Intervention:  Ensure Enlive BID or Boost Plus BID per pt preference and availability Small protein containing snacks TID between meals  Drusilla Kanner, RDN, LDN Clinical Nutrition

## 2023-05-08 NOTE — Progress Notes (Addendum)
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  S/P whipple procedure. 1/20 removed the epidural. PMHx: Basal cell carcinoma, depression, DM, diabetic neuropathy, HTN, IC, Neuromuscular d/o/peripheral neuropathy    PT Comments  Pt received in supine, c/o fatigue after recently ambulating in hallway with IV pole modI per his report. Pt agreeable to instruction on log rolling technique for abdominal protection and bed mobility but defers gait trial at this time. Pt positioned for comfort/pressure relief and RN notified. Pt participating well with mobility specialist and getting OOB with assist PRN from nursing staff, may be able to sign off acute therapies, supervising PT Ryan L notified; continue to recommend HHPT and Rollator DME for energy conservation/pt safety.     If plan is discharge home, recommend the following: Assistance with cooking/housework;Assist for transportation;A little help with walking and/or transfers;A little help with bathing/dressing/bathroom;Help with stairs or ramp for entrance   Can travel by private vehicle        Equipment Recommendations  Rollator (4 wheels)    Recommendations for Other Services       Precautions / Restrictions Precautions Precautions: Fall Precaution Comments: x2 JP drains R side Restrictions Weight Bearing Restrictions Per Provider Order: No     Mobility  Bed Mobility Overal bed mobility: Needs Assistance Bed Mobility: Rolling, Sidelying to Sit, Sit to Sidelying Rolling: Modified independent (Device/Increase time) Sidelying to sit: Used rails, Supervision     Sit to sidelying: Supervision General bed mobility comments: cues for log roll for reduced abdominal pain    Transfers Overall transfer level: Needs  assistance Equipment used: None Transfers: Bed to chair/wheelchair/BSC       Squat pivot transfers: Supervision     General transfer comment: cues for squat pivot technique toward HOB ~32ft as pt was too low in the bed. Pt not needing lift assist and able to fully lift his hips off the bed x3 scoots.    Ambulation/Gait               General Gait Details: pt defers, reporting he recently got back to bed from ambulating in hallway on his own, and sat up in the chair.   Stairs             Wheelchair Mobility     Tilt Bed    Modified Rankin (Stroke Patients Only)       Balance Overall balance assessment: Needs assistance Sitting-balance support: Single extremity supported, Feet supported, No upper extremity supported Sitting balance-Leahy Scale: Good Sitting balance - Comments: EOB       Standing balance comment: pt defers gait this date due to fatigue                            Cognition Arousal: Alert Behavior During Therapy: WFL for tasks assessed/performed Overall Cognitive Status: Within Functional Limits for tasks assessed                                 General Comments: Likely closer to his baseline, spouse not present to confirm. Occasional cues needed for improved technique but pt reports modI in hallway using IV pole earlier in the day and working well with mobility specialist.  Exercises      General Comments        Pertinent Vitals/Pain Pain Assessment Pain Assessment: Faces Faces Pain Scale: No hurt Pain Location: abdomen Pain Descriptors / Indicators: Operative site guarding Pain Intervention(s): Limited activity within patient's tolerance, Monitored during session, Repositioned    Home Living                          Prior Function            PT Goals (current goals can now be found in the care plan section) Acute Rehab PT Goals Patient Stated Goal: pt wants to be independent and  able to paint PT Goal Formulation: With patient Time For Goal Achievement: 05/12/23 Progress towards PT goals: Progressing toward goals    Frequency    Min 1X/week      PT Plan      Co-evaluation              AM-PAC PT "6 Clicks" Mobility   Outcome Measure  Help needed turning from your back to your side while in a flat bed without using bedrails?: None Help needed moving from lying on your back to sitting on the side of a flat bed without using bedrails?: A Little Help needed moving to and from a bed to a chair (including a wheelchair)?: A Little Help needed standing up from a chair using your arms (e.g., wheelchair or bedside chair)?: A Little Help needed to walk in hospital room?: A Little Help needed climbing 3-5 steps with a railing? : A Little 6 Click Score: 19    End of Session   Activity Tolerance: Patient limited by fatigue Patient left: in bed;with call bell/phone within reach;Other (comment) (pt heels floated) Nurse Communication: Mobility status (pt wants a vanilla ensure once it is cooled down (was not in fridge, PTA placed it in fridge for him)) PT Visit Diagnosis: Unsteadiness on feet (R26.81);Other abnormalities of gait and mobility (R26.89);Other (comment)     Time: 3244-0102 PT Time Calculation (min) (ACUTE ONLY): 8 min  Charges:    $Therapeutic Activity: 8-22 mins PT General Charges $$ ACUTE PT VISIT: 1 Visit                     Darik Massing P., PTA Acute Rehabilitation Services Secure Chat Preferred 9a-5:30pm Office: [phone]    Dorathy Kinsman [facility] 05/08/2023, 5:46 PM

## 2023-05-08 NOTE — Progress Notes (Signed)
Inpatient Rehab Admissions Coordinator:   I spoke with Pt. Regarding CIR and he repeated that he prefers to go home. Was ambulating without assist or device in his room as I arrived. I will not pursue for admit. CIR to sign off.   Megan Salon, MS, CCC-SLP Rehab Admissions Coordinator  [phone] (celll) [phone] (office)

## 2023-05-09 LAB — CBC WITH DIFFERENTIAL/PLATELET
Abs Immature Granulocytes: 0.09 10*3/uL — ABNORMAL HIGH (ref 0.00–0.07)
Basophils Absolute: 0 10*3/uL (ref 0.0–0.1)
Basophils Relative: 1 %
Eosinophils Absolute: 0.2 10*3/uL (ref 0.0–0.5)
Eosinophils Relative: 2 %
HCT: 32.6 % — ABNORMAL LOW (ref 39.0–52.0)
Hemoglobin: 11 g/dL — ABNORMAL LOW (ref 13.0–17.0)
Immature Granulocytes: 1 %
Lymphocytes Relative: 8 %
Lymphs Abs: 0.6 10*3/uL — ABNORMAL LOW (ref 0.7–4.0)
MCH: 30.1 pg (ref 26.0–34.0)
MCHC: 33.7 g/dL (ref 30.0–36.0)
MCV: 89.3 fL (ref 80.0–100.0)
Monocytes Absolute: 0.9 10*3/uL (ref 0.1–1.0)
Monocytes Relative: 11 %
Neutro Abs: 6.2 10*3/uL (ref 1.7–7.7)
Neutrophils Relative %: 77 %
Platelets: 514 10*3/uL — ABNORMAL HIGH (ref 150–400)
RBC: 3.65 MIL/uL — ABNORMAL LOW (ref 4.22–5.81)
RDW: 13.5 % (ref 11.5–15.5)
WBC: 7.9 10*3/uL (ref 4.0–10.5)
nRBC: 0 % (ref 0.0–0.2)

## 2023-05-09 LAB — COMPREHENSIVE METABOLIC PANEL
ALT: 63 U/L — ABNORMAL HIGH (ref 0–44)
AST: 33 U/L (ref 15–41)
Albumin: 2.2 g/dL — ABNORMAL LOW (ref 3.5–5.0)
Alkaline Phosphatase: 160 U/L — ABNORMAL HIGH (ref 38–126)
Anion gap: 10 (ref 5–15)
BUN: 13 mg/dL (ref 8–23)
CO2: 20 mmol/L — ABNORMAL LOW (ref 22–32)
Calcium: 8.9 mg/dL (ref 8.9–10.3)
Chloride: 110 mmol/L (ref 98–111)
Creatinine, Ser: 0.92 mg/dL (ref 0.61–1.24)
GFR, Estimated: >60 mL/min (ref >60)
Glucose, Bld: 129 mg/dL — ABNORMAL HIGH (ref 70–99)
Potassium: 4.5 mmol/L (ref 3.5–5.1)
Sodium: 140 mmol/L (ref 135–145)
Total Bilirubin: 0.6 mg/dL (ref 0.0–1.2)
Total Protein: 5 g/dL — ABNORMAL LOW (ref 6.5–8.1)

## 2023-05-09 LAB — GLUCOSE, CAPILLARY
Glucose-Capillary: 116 mg/dL — ABNORMAL HIGH (ref 70–99)
Glucose-Capillary: 109 mg/dL — ABNORMAL HIGH (ref 70–99)
Glucose-Capillary: 125 mg/dL — ABNORMAL HIGH (ref 70–99)
Glucose-Capillary: 121 mg/dL — ABNORMAL HIGH (ref 70–99)
Glucose-Capillary: 121 mg/dL — ABNORMAL HIGH (ref 70–99)

## 2023-05-09 LAB — PHOSPHORUS: Phosphorus: 2.4 mg/dL — ABNORMAL LOW (ref 2.5–4.6)

## 2023-05-09 LAB — MAGNESIUM: Magnesium: 2 mg/dL (ref 1.7–2.4)

## 2023-05-09 MED ORDER — PANCRELIPASE (LIP-PROT-AMYL) 12000-38000 UNITS PO CPEP
24000.0000 [IU] | ORAL_CAPSULE | Freq: Three times a day (TID) | ORAL | Status: DC
  Administered 2023-05-09 – 2023-05-10 (×4): 24000 [IU] via ORAL
  Filled 2023-05-09 (×5): qty 2

## 2023-05-09 MED ORDER — MEGESTROL ACETATE 400 MG/10ML PO SUSP
400.0000 mg | Freq: Every day | ORAL | Status: DC
  Administered 2023-05-09 – 2023-05-10 (×2): 400 mg via ORAL
  Filled 2023-05-09 (×3): qty 10

## 2023-05-09 NOTE — Progress Notes (Signed)
Mobility Specialist Progress Note:    05/09/23 1135  Mobility  Activity Ambulated with assistance in hallway  Level of Assistance Standby assist, set-up cues, supervision of patient - no hands on  Assistive Device Four wheel walker  Distance Ambulated (ft) 200 ft  Activity Response Tolerated well  Mobility Referral Yes (daily if able!)  Mobility visit 1 Mobility  Mobility Specialist Start Time (ACUTE ONLY) 1116  Mobility Specialist Stop Time (ACUTE ONLY) 1127  Mobility Specialist Time Calculation (min) (ACUTE ONLY) 11 min   Pt received in bed and agreeable. Required minA to come EOB d/t some pain at abdominal incision site. No complaints throughout ambulation. Took x1 seated rest break and returned to room w/o fault. Pt left in bed with call bell and all needs met.  D'Vante Earlene Plater Mobility Specialist Please contact via Special educational needs teacher or Rehab office at [phone]

## 2023-05-09 NOTE — Progress Notes (Signed)
12 Days Post-Op   Subjective/Chief Complaint: Patient continues improvement.  Nutrition documenting around 45% and 60% of nutritional needs PO 1/25 and 1/26 respectively.  Wife thinks this is an overestimation based on what was gone from his tray.    Having loose stools.  Sleeping poorly due to interventions on the floor per patient.  Called spouse multiple times overnight due to these issues.    Objective: Vital signs in last 24 hours: Temp:  [98.2 F (36.8 C)-98.8 F (37.1 C)] 98.8 F (37.1 C) (01/28 0900) Pulse Rate:  [78-97] 78 (01/28 0900) Resp:  [16-18] 16 (01/28 0900) BP: (131-147)/(66-90) 147/77 (01/28 0900) SpO2:  [99 %-100 %] 100 % (01/28 0900) Last BM Date : 05/09/23  Intake/Output from previous day: 01/27 0701 - 01/28 0700 In: 360 [P.O.:360] Out: 350 [Urine:300; Drains:50] Intake/Output this shift: No intake/output data recorded.  General appearance: alert, cooperative, and no distress Resp: breathing comfortably on room air GI: soft, non distended, incision clean and dry. Staples removed.   JP#1 with cloudy brown fluid,  JP #2 out.  Extremities: extremities normal, atraumatic, no cyanosis or edema  Lab Results:  Recent Labs    05/09/23 0957  WBC 7.9  HGB 11.0*  HCT 32.6*  PLT 514*   BMET No results for input(s): "NA", "K", "CL", "CO2", "GLUCOSE", "BUN", "CREATININE", "CALCIUM" in the last 72 hours.   Anti-infectives: Anti-infectives (From admission, onward)    Start     Dose/Rate Route Frequency Ordered Stop   05/08/23 0930  cefTRIAXone (ROCEPHIN) 2 g in sodium chloride 0.9 % 100 mL IVPB        2 g 200 mL/hr over 30 Minutes Intravenous Every 24 hours 05/08/23 0844 05/12/23 0929   05/06/23 1200  piperacillin-tazobactam (ZOSYN) IVPB 3.375 g  Status:  Discontinued        3.375 g 12.5 mL/hr over 240 Minutes Intravenous Every 8 hours 05/06/23 0922 05/08/23 0844   05/05/23 1015  piperacillin-tazobactam (ZOSYN) IVPB 3.375 g  Status:  Discontinued         3.375 g 12.5 mL/hr over 240 Minutes Intravenous Every 6 hours 05/05/23 0917 05/06/23 0922   04/27/23 2100  ceFAZolin (ANCEF) IVPB 2g/100 mL premix        2 g 200 mL/hr over 30 Minutes Intravenous Every 8 hours 04/27/23 1559 04/27/23 2050   04/27/23 0700  ceFAZolin (ANCEF) IVPB 2g/100 mL premix        2 g 200 mL/hr over 30 Minutes Intravenous On call to O.R. 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  Negative margins.    PT/OT Tylenol -cont oxycodone, Robaxin H/o interstitial cystitis Klonopin for anxiety/sleep   DM- blood sugars well-controlled. continuous glucose monitor and ssi. Standing long acting insulin as well.    Ambulate  Incentive spirometry  Diarrhea - start 1 creon TID with meals for now.  Severe protein calorie malnutrition/poor appetite - adding megace.     Ceftriaxone for panc leak.  Drain output already coming down.    Main issue now is timing of d/c. Patient's spouse has appropriate concerns about her abilities at home.  However, patient is doing quite well overall.   I have discussed with nursing staff avoiding interventions tonight to see how he sleeps without interruptions.  Tentative plan d/c tomorrow assuming continued increase  of oral intake, decreased stools, and better sleep.     LOS: 12 days   [name], MD [facility] Surgery 05/09/2023, 10:53 AM Please see Amion for pager number during day hours 7:00am-4:30pm

## 2023-05-10 ENCOUNTER — Other Ambulatory Visit (HOSPITAL_COMMUNITY): Payer: Self-pay

## 2023-05-10 DIAGNOSIS — C7A8 Other malignant neuroendocrine tumors: Secondary | ICD-10-CM

## 2023-05-10 LAB — GLUCOSE, CAPILLARY: Glucose-Capillary: 117 mg/dL — ABNORMAL HIGH (ref 70–99)

## 2023-05-10 MED ORDER — PANTOPRAZOLE SODIUM 40 MG PO TBEC
40.0000 mg | DELAYED_RELEASE_TABLET | Freq: Every day | ORAL | 3 refills | Status: DC
  Filled 2023-05-10: qty 30, 30d supply, fill #0

## 2023-05-10 MED ORDER — OXYCODONE HCL 5 MG PO TABS
5.0000 mg | ORAL_TABLET | ORAL | 0 refills | Status: DC | PRN
  Filled 2023-05-10: qty 30, 5d supply, fill #0

## 2023-05-10 MED ORDER — PROCHLORPERAZINE MALEATE 10 MG PO TABS
10.0000 mg | ORAL_TABLET | Freq: Four times a day (QID) | ORAL | 0 refills | Status: DC | PRN
  Filled 2023-05-10: qty 30, 8d supply, fill #0

## 2023-05-10 MED ORDER — ROSUVASTATIN CALCIUM 20 MG PO TABS
20.0000 mg | ORAL_TABLET | Freq: Every day | ORAL | 3 refills | Status: DC
  Filled 2023-05-10: qty 90, 90d supply, fill #0

## 2023-05-10 MED ORDER — ACETAMINOPHEN 500 MG PO TABS: 1000.0000 mg | ORAL_TABLET | Freq: Four times a day (QID) | ORAL | 0 refills | Status: AC | PRN

## 2023-05-10 MED ORDER — PANCRELIPASE (LIP-PROT-AMYL) 24000-76000 UNITS PO CPEP: 24000.0000 [IU] | ORAL_CAPSULE | Freq: Three times a day (TID) | ORAL | 3 refills | Status: DC

## 2023-05-10 MED ORDER — ENSURE ENLIVE PO LIQD
237.0000 mL | Freq: Two times a day (BID) | ORAL | 12 refills | Status: DC
  Filled 2023-05-10: qty 237, 1d supply, fill #0

## 2023-05-10 MED ORDER — MEGESTROL ACETATE 400 MG/10ML PO SUSP
400.0000 mg | Freq: Every day | ORAL | 0 refills | Status: DC
  Filled 2023-05-10: qty 240, 24d supply, fill #0

## 2023-05-10 MED ORDER — LANTUS SOLOSTAR 100 UNIT/ML ~~LOC~~ SOPN: 10.0000 [IU] | PEN_INJECTOR | Freq: Every day | SUBCUTANEOUS | Status: DC

## 2023-05-10 MED ORDER — SIMETHICONE 80 MG PO CHEW
80.0000 mg | CHEWABLE_TABLET | Freq: Four times a day (QID) | ORAL | 2 refills | Status: DC | PRN
  Filled 2023-05-10: qty 50, 13d supply, fill #0

## 2023-05-10 MED ORDER — METHOCARBAMOL 500 MG PO TABS
500.0000 mg | ORAL_TABLET | Freq: Four times a day (QID) | ORAL | 2 refills | Status: DC | PRN
  Filled 2023-05-10: qty 20, 5d supply, fill #0

## 2023-05-10 MED ORDER — PANCRELIPASE (LIP-PROT-AMYL) 24000-76000 UNITS PO CPEP
24000.0000 [IU] | ORAL_CAPSULE | Freq: Three times a day (TID) | ORAL | 3 refills | Status: DC
  Filled 2023-05-10: qty 100, 34d supply, fill #0

## 2023-05-10 NOTE — TOC Transition Note (Signed)
Transition of Care [facility]) - Discharge Note   Patient Details  Name: Andrew Tanner MRN: [id number] Date of Birth: [birth year]  [facility] CM/SW Contact:  Epifanio Lesches, RN Phone Number: 05/10/2023, 9:54 AM   Clinical Narrative:    Patient will DC [id number] Anticipated DC date: 05/10/2023 Family notified: yes Transport by: car  - S/p Classic pancreaticoduodenectomy   Placement of pancreatic duct stent , 1/16  Per MD patient ready for DC today. RN, patient, patient's family, and [facility] notified of DC.   Referral made with [employer] for DME rollator and BSC. Equipment will be delivered to bedside prior to d/c. Pt without RX med concerns or transportation issues. Post hospital f/u noted on AVS.  Wife to provide transportation to home.  RNCM will sign off for now as intervention is no longer needed. Please consult [place] again if new needs arise.         Patient Goals and CMS Choice     Choice offered to / list presented to : Patient      Discharge Placement                       Discharge Plan and Services Additional resources added to the After Visit Summary for                  DME Arranged: Bedside commode, Walker rolling with seat DME Agency: [employer] Date DME Agency Contacted: 05/10/23 Time DME Agency Contacted: [phone] Representative spoke with at DME Agency: Vaughan Basta HH Arranged: PT, OT, RN, Nurse's Aide HH Agency: [facility] Date [facility] Agency Contacted: 05/10/23 Time HH Agency Contacted: [phone] Representative spoke with at [facility] Agency: Tresa Endo  Social Drivers of Health (SDOH) Interventions SDOH Screenings   Food Insecurity: No Food Insecurity (04/27/2023)  Housing: Low Risk  (04/27/2023)  Transportation Needs: No Transportation Needs (04/27/2023)  Utilities: Not At Risk (04/27/2023)  Alcohol Screen: Low Risk  (10/19/2020)  Depression (PHQ2-9): Low Risk  (03/20/2023)  Financial Resource  Strain: Low Risk  (03/16/2023)  Physical Activity: Sufficiently Active (03/16/2023)  Social Connections: Socially Integrated (04/27/2023)  Stress: No Stress Concern Present (03/16/2023)  Tobacco Use: Medium Risk (04/27/2023)  Health Literacy: Adequate Health Literacy (01/03/2023)     Readmission Risk Interventions     No data to display

## 2023-05-10 NOTE — Discharge Summary (Signed)
Physician Discharge Summary  Patient ID: Chirstopher Iovino MRN: [id number] DOB/AGE: 1950-12-30 73 y.o.  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  He had a epidural for pain control in addition to a PCA, IV Tylenol, and IV muscle relaxants.  He required some minor electrolyte repletion.  He was kept on benzodiazepines for avoidance of bladder spasms.  NG tube came out on postop day 2.  Small amounts of clear liquids were started.  Epidural was removed on postop day 4.  He was able to transition off the PCA as well.  Diet was slowly advanced.  He had some belching and liquid bowel movements and took a few days to develop more of an appetite.  One of his drains appeared very murky and cloudy.  Amylase was sent in both of these drains and one of them was low.  This 1 was removed.  The 1 that was grossly abnormal was high and was left in place.  He was started on antibiotics.  This path came back and the duodenal adenoma did not contain any invasive disease.  There was dysplasia present.  He was incidentally found to have a very small low-grade malignant neuroendocrine tumor, stage pT1N0.    He worked with therapy and originally it was entertained the possibility of sending him to rehab, but his stability improved significantly quickly and was determined that he would be able to go home with home health PT.  Calorie counts were initiated and he did improve his intake.  As his  diet was advanced he continued to have some loose stools so he was started on Creon.  The surgical drain was removed.  He was discharged to home in stable condition with 1 drain and drain care.  He was able to walk safely with walker.  He used minimal assistance getting up.  His narcotic need was diminished significantly prior to discharge.  Staples were removed prior to discharge.  He was discharged on postop day 13.  Consults:  Diabetes management team  Significant Diagnostic Studies: Creatinine, white count, sodium potassium were normal prior to discharge.  Slight increase in alk phos which is not abnormal at this stage of recovery.  Hematocrit stable at 32.6  Treatments: surgery: See above  Discharge Exam: Blood pressure (!) 142/82, pulse 100, temperature 97.9 F (36.6 C), temperature source Oral, resp. rate 17, height 5\' 11"  (1.803 m), weight 80.7 kg, SpO2 93%. General appearance: alert, cooperative, and no distress Resp: Breathing comfortably GI: Soft, nontender, nondistended.  Midline incision without erythema or drainage.  Minimal drainage from drain site where the drain had been removed, cloudy drainage and the remaining surgical drain Extremities: Trace edema, good pulses  Disposition: Discharge disposition: 01-Home or Self Care       Discharge Instructions     Amb Referral to Nutrition and Diabetic Education   Complete by: As directed    Call MD for:  difficulty breathing, headache or visual disturbances   Complete by: As directed    Call MD for:  hives   Complete by:  As directed    Call MD for:  persistant nausea and vomiting   Complete by: As directed    Call MD for:  redness, tenderness, or signs of infection (pain, swelling, redness, odor or green/yellow discharge around incision site)   Complete by: As directed    Call MD for:  severe uncontrolled pain   Complete by: As directed    Call MD for:  temperature >100.4   Complete by: As directed    Change  dressing (specify)   Complete by: As directed    Measure and record drain output 1-2 times per day.  Bring record to clinic.  Change outer gauze around drain as needed.    Ok to shower   Diet - low sodium heart healthy   Complete by: As directed    Increase activity slowly   Complete by: As directed       Allergies as of 05/10/2023   No Known Allergies      Medication List     STOP taking these medications    ibuprofen 800 MG tablet Commonly known as: ADVIL       TAKE these medications    acetaminophen 500 MG tablet Commonly known as: TYLENOL Take 2 tablets (1,000 mg total) by mouth every 6 (six) hours as needed for mild pain (pain score 1-3).   alfuzosin 10 MG 24 hr tablet Commonly known as: UROXATRAL Take 1 tablet (10 mg total) by mouth daily with breakfast.   clonazePAM 0.5 MG tablet Commonly known as: KLONOPIN Take 1.5 mg by mouth at bedtime.   Dexcom G7 Sensor Misc Use to monitor glucose continuously, change every 10 days   feeding supplement Liqd Take 237 mLs by mouth 2 (two) times daily between meals.   FLUoxetine 20 MG capsule Commonly known as: PROZAC Take 60 mg by mouth every morning.   glucose blood test strip 1 each by Other route in the morning and at bedtime. Use as instructed checking once per day, or if new symptoms.   hydrOXYzine 50 MG tablet Commonly known as: ATARAX Take 50 mg by mouth at bedtime as needed (sleep/anxiety.).   Insulin Pen Needle 32G X 4 MM Misc Use 2x a day   B-D UF III MINI PEN NEEDLES 31G X 5 MM Misc Generic drug: Insulin Pen Needle USE DAILY AT 12 NOON   ketoconazole 2 % shampoo Commonly known as: NIZORAL APPLY TO SCALP AND FACE 2 TO 3 TIMES A WEEK.   Lantus SoloStar 100 UNIT/ML Solostar Pen Generic drug: insulin glargine Inject 10 Units into the skin at bedtime.   Lyumjev KwikPen 100 UNIT/ML KwikPen Generic drug: Insulin Lispro-aabc Inject 3-6 units under skin of insulin before dinner What changed:   how much to take how to take this when to take this additional instructions   megestrol 40 MG/ML suspension Commonly known as: MEGACE Take 10 mLs (400 mg total) by mouth daily.   methocarbamol 500 MG tablet Commonly known as: ROBAXIN Take 1 tablet (500 mg total) by mouth every 6 (six) hours as needed for muscle spasms (pain).   nystatin-triamcinolone ointment Commonly known as: MYCOLOG Apply to scalp and face twice a day for up to 1 week and stop   oxyCODONE 5 MG immediate release tablet Commonly known as: Oxy IR/ROXICODONE Take 1 tablet (5 mg total) by mouth every 4 (four) hours as needed for severe pain (pain score 7-10), breakthrough pain or moderate pain (pain score 4-6).   Pancrelipase (Lip-Prot-Amyl) 24000-76000 units Cpep  Take 1 capsule (24,000 Units total) by mouth 3 (three) times daily before meals.   pantoprazole 40 MG tablet Commonly known as: PROTONIX Take 1 tablet (40 mg total) by mouth at bedtime.   polyethylene glycol 17 g packet Commonly known as: MIRALAX / GLYCOLAX Take 17 g by mouth daily as needed (constipation.).   prochlorperazine 10 MG tablet Commonly known as: COMPAZINE Take 1 tablet (10 mg total) by mouth every 6 (six) hours as needed for nausea or vomiting (Use for nausea and / or vomiting unresolved with ondansetron (Zofran).).   rosuvastatin 20 MG tablet Commonly known as: Crestor Take 1 tablet (20 mg total) by mouth daily. What changed: when to take this   simethicone 80 MG chewable tablet Commonly known as: MYLICON Chew 1 tablet (80 mg total) by mouth every 6 (six) hours as needed for flatulence.               Discharge Care Instructions  (From admission, onward)           Start     Ordered   05/10/23 0000  Change dressing (specify)       Comments: Measure and record drain output 1-2 times per day.  Bring record to clinic.  Change outer gauze around drain as needed.    Ok to shower   05/10/23 0844             Follow-up Information     [name], MD Follow up in 2 week(s).   Specialty: General Surgery Contact information: [address] [place] [address] [phone]         Jeani Sow, MD Follow up.   Specialty: Family Medicine Contact information: [address] [place] [address] [phone]         [facility] Follow up.   Specialty: Home Health Services Why: home health services will be provided by [facility], start of care within48 hours post discharge Contact information: [address] [place] [address] [phone]                 Signed: [name] 05/10/2023, 5:48 PM

## 2023-05-11 ENCOUNTER — Telehealth: Payer: Self-pay | Admitting: *Deleted

## 2023-05-11 DIAGNOSIS — Z4803 Encounter for change or removal of drains: Secondary | ICD-10-CM | POA: Diagnosis not present

## 2023-05-11 DIAGNOSIS — E1159 Type 2 diabetes mellitus with other circulatory complications: Secondary | ICD-10-CM | POA: Diagnosis not present

## 2023-05-11 DIAGNOSIS — Z6824 Body mass index (BMI) 24.0-24.9, adult: Secondary | ICD-10-CM | POA: Diagnosis not present

## 2023-05-11 DIAGNOSIS — Z483 Aftercare following surgery for neoplasm: Secondary | ICD-10-CM | POA: Diagnosis not present

## 2023-05-11 DIAGNOSIS — Z794 Long term (current) use of insulin: Secondary | ICD-10-CM | POA: Diagnosis not present

## 2023-05-11 DIAGNOSIS — C170 Malignant neoplasm of duodenum: Secondary | ICD-10-CM | POA: Diagnosis not present

## 2023-05-11 DIAGNOSIS — E559 Vitamin D deficiency, unspecified: Secondary | ICD-10-CM | POA: Diagnosis not present

## 2023-05-11 DIAGNOSIS — E440 Moderate protein-calorie malnutrition: Secondary | ICD-10-CM | POA: Diagnosis not present

## 2023-05-11 DIAGNOSIS — E7800 Pure hypercholesterolemia, unspecified: Secondary | ICD-10-CM | POA: Diagnosis not present

## 2023-05-11 DIAGNOSIS — E1165 Type 2 diabetes mellitus with hyperglycemia: Secondary | ICD-10-CM | POA: Diagnosis not present

## 2023-05-11 DIAGNOSIS — I10 Essential (primary) hypertension: Secondary | ICD-10-CM | POA: Diagnosis not present

## 2023-05-11 DIAGNOSIS — Z87891 Personal history of nicotine dependence: Secondary | ICD-10-CM | POA: Diagnosis not present

## 2023-05-11 DIAGNOSIS — E860 Dehydration: Secondary | ICD-10-CM | POA: Diagnosis not present

## 2023-05-11 DIAGNOSIS — E1142 Type 2 diabetes mellitus with diabetic polyneuropathy: Secondary | ICD-10-CM | POA: Diagnosis not present

## 2023-05-11 DIAGNOSIS — H269 Unspecified cataract: Secondary | ICD-10-CM | POA: Diagnosis not present

## 2023-05-11 LAB — AMYLASE, BODY FLUID (OTHER)

## 2023-05-11 NOTE — Transitions of Care (Post Inpatient/ED Visit) (Signed)
   05/11/2023  Name: Andrew Tanner MRN: [id number] DOB: November 28, 1950  Today's TOC FU Call Status: Today's TOC FU Call Status:: Unsuccessful Call (1st Attempt) Unsuccessful Call (1st Attempt) Date: 05/11/23  Attempted to reach the patient regarding the most recent Inpatient/ED visit.  Follow Up Plan: Additional outreach attempts will be made to reach the patient to complete the Transitions of Care (Post Inpatient/ED visit) call.   Irving Shows [facility], BSN RN Care Manager/ Transition of Care [place]/ [facility] [phone]

## 2023-05-12 ENCOUNTER — Telehealth: Payer: Self-pay | Admitting: *Deleted

## 2023-05-12 ENCOUNTER — Telehealth: Payer: Self-pay | Admitting: Family Medicine

## 2023-05-12 NOTE — Transitions of Care (Post Inpatient/ED Visit) (Signed)
   05/12/2023  Name: Andrew Tanner MRN: [id number] DOB: 02-12-1951  Today's TOC FU Call Status: Today's TOC FU Call Status:: Unsuccessful Call (2nd Attempt) Unsuccessful Call (2nd Attempt) Date: 05/12/23  Attempted to reach the patient regarding the most recent Inpatient/ED visit.  Follow Up Plan: Additional outreach attempts will be made to reach the patient to complete the Transitions of Care (Post Inpatient/ED visit) call.   Irving Shows [facility], BSN RN Care Manager/ Transition of Care [place]/ [facility] [phone]

## 2023-05-12 NOTE — Telephone Encounter (Signed)
Spoke to [place] and she stated that she will fax over form for provider to sign. She also stated that she saw patient yesterday and believe patient should have a hospital follow-up sooner than is March appointment.

## 2023-05-12 NOTE — Telephone Encounter (Signed)
STEPHINE FROM [facility] IS NEEDING CONTINUE HOME HEALTH CARE ORDERS FOR PATIENT

## 2023-05-12 NOTE — Telephone Encounter (Signed)
Please schedule patient for hospital follow-up. See message below. Thanks

## 2023-05-13 ENCOUNTER — Emergency Department (HOSPITAL_COMMUNITY): Payer: Medicare Other

## 2023-05-13 ENCOUNTER — Other Ambulatory Visit: Payer: Self-pay

## 2023-05-13 ENCOUNTER — Inpatient Hospital Stay (HOSPITAL_COMMUNITY)
Admission: EM | Admit: 2023-05-13 | Discharge: 2023-06-09 | DRG: 391 | Disposition: A | Discharge status: Skilled Nursing Facility | Payer: Medicare Other | Attending: General Surgery | Admitting: General Surgery

## 2023-05-13 ENCOUNTER — Encounter (HOSPITAL_COMMUNITY): Payer: Self-pay | Admitting: *Deleted

## 2023-05-13 DIAGNOSIS — K7689 Other specified diseases of liver: Secondary | ICD-10-CM | POA: Diagnosis not present

## 2023-05-13 DIAGNOSIS — J90 Pleural effusion, not elsewhere classified: Secondary | ICD-10-CM | POA: Diagnosis not present

## 2023-05-13 DIAGNOSIS — R41841 Cognitive communication deficit: Secondary | ICD-10-CM | POA: Diagnosis not present

## 2023-05-13 DIAGNOSIS — E1159 Type 2 diabetes mellitus with other circulatory complications: Secondary | ICD-10-CM | POA: Diagnosis not present

## 2023-05-13 DIAGNOSIS — M6281 Muscle weakness (generalized): Secondary | ICD-10-CM | POA: Diagnosis not present

## 2023-05-13 DIAGNOSIS — Z87891 Personal history of nicotine dependence: Secondary | ICD-10-CM | POA: Diagnosis not present

## 2023-05-13 DIAGNOSIS — Z85828 Personal history of other malignant neoplasm of skin: Secondary | ICD-10-CM | POA: Diagnosis not present

## 2023-05-13 DIAGNOSIS — R197 Diarrhea, unspecified: Secondary | ICD-10-CM

## 2023-05-13 DIAGNOSIS — E43 Unspecified severe protein-calorie malnutrition: Secondary | ICD-10-CM | POA: Diagnosis present

## 2023-05-13 DIAGNOSIS — E860 Dehydration: Secondary | ICD-10-CM

## 2023-05-13 DIAGNOSIS — E785 Hyperlipidemia, unspecified: Secondary | ICD-10-CM | POA: Diagnosis present

## 2023-05-13 DIAGNOSIS — Y92230 Patient room in hospital as the place of occurrence of the external cause: Secondary | ICD-10-CM | POA: Diagnosis not present

## 2023-05-13 DIAGNOSIS — Z83438 Family history of other disorder of lipoprotein metabolism and other lipidemia: Secondary | ICD-10-CM | POA: Diagnosis not present

## 2023-05-13 DIAGNOSIS — Z90411 Acquired partial absence of pancreas: Secondary | ICD-10-CM

## 2023-05-13 DIAGNOSIS — Z483 Aftercare following surgery for neoplasm: Secondary | ICD-10-CM | POA: Diagnosis not present

## 2023-05-13 DIAGNOSIS — K529 Noninfective gastroenteritis and colitis, unspecified: Secondary | ICD-10-CM | POA: Diagnosis not present

## 2023-05-13 DIAGNOSIS — K8689 Other specified diseases of pancreas: Secondary | ICD-10-CM | POA: Diagnosis not present

## 2023-05-13 DIAGNOSIS — R1110 Vomiting, unspecified: Secondary | ICD-10-CM | POA: Diagnosis present

## 2023-05-13 DIAGNOSIS — Z794 Long term (current) use of insulin: Secondary | ICD-10-CM | POA: Diagnosis not present

## 2023-05-13 DIAGNOSIS — G629 Polyneuropathy, unspecified: Secondary | ICD-10-CM | POA: Diagnosis present

## 2023-05-13 DIAGNOSIS — R531 Weakness: Secondary | ICD-10-CM | POA: Diagnosis not present

## 2023-05-13 DIAGNOSIS — S0990XA Unspecified injury of head, initial encounter: Secondary | ICD-10-CM | POA: Diagnosis not present

## 2023-05-13 DIAGNOSIS — F419 Anxiety disorder, unspecified: Secondary | ICD-10-CM | POA: Diagnosis not present

## 2023-05-13 DIAGNOSIS — Z79818 Long term (current) use of other agents affecting estrogen receptors and estrogen levels: Secondary | ICD-10-CM

## 2023-05-13 DIAGNOSIS — C7A8 Other malignant neuroendocrine tumors: Secondary | ICD-10-CM | POA: Diagnosis not present

## 2023-05-13 DIAGNOSIS — Z833 Family history of diabetes mellitus: Secondary | ICD-10-CM

## 2023-05-13 DIAGNOSIS — N281 Cyst of kidney, acquired: Secondary | ICD-10-CM | POA: Diagnosis not present

## 2023-05-13 DIAGNOSIS — D3A8 Other benign neuroendocrine tumors: Secondary | ICD-10-CM | POA: Diagnosis present

## 2023-05-13 DIAGNOSIS — I10 Essential (primary) hypertension: Secondary | ICD-10-CM | POA: Diagnosis not present

## 2023-05-13 DIAGNOSIS — E440 Moderate protein-calorie malnutrition: Secondary | ICD-10-CM | POA: Diagnosis not present

## 2023-05-13 DIAGNOSIS — Y92009 Unspecified place in unspecified non-institutional (private) residence as the place of occurrence of the external cause: Secondary | ICD-10-CM | POA: Diagnosis not present

## 2023-05-13 DIAGNOSIS — Z7401 Bed confinement status: Secondary | ICD-10-CM | POA: Diagnosis not present

## 2023-05-13 DIAGNOSIS — R2689 Other abnormalities of gait and mobility: Secondary | ICD-10-CM | POA: Diagnosis not present

## 2023-05-13 DIAGNOSIS — J9811 Atelectasis: Secondary | ICD-10-CM | POA: Diagnosis not present

## 2023-05-13 DIAGNOSIS — E041 Nontoxic single thyroid nodule: Secondary | ICD-10-CM | POA: Diagnosis not present

## 2023-05-13 DIAGNOSIS — R0602 Shortness of breath: Secondary | ICD-10-CM | POA: Diagnosis not present

## 2023-05-13 DIAGNOSIS — W06XXXA Fall from bed, initial encounter: Secondary | ICD-10-CM | POA: Diagnosis not present

## 2023-05-13 DIAGNOSIS — S199XXA Unspecified injury of neck, initial encounter: Secondary | ICD-10-CM | POA: Diagnosis not present

## 2023-05-13 DIAGNOSIS — F32A Depression, unspecified: Secondary | ICD-10-CM | POA: Diagnosis not present

## 2023-05-13 DIAGNOSIS — G9389 Other specified disorders of brain: Secondary | ICD-10-CM | POA: Diagnosis not present

## 2023-05-13 DIAGNOSIS — H269 Unspecified cataract: Secondary | ICD-10-CM | POA: Diagnosis not present

## 2023-05-13 DIAGNOSIS — C170 Malignant neoplasm of duodenum: Secondary | ICD-10-CM | POA: Diagnosis present

## 2023-05-13 DIAGNOSIS — M47812 Spondylosis without myelopathy or radiculopathy, cervical region: Secondary | ICD-10-CM | POA: Diagnosis not present

## 2023-05-13 DIAGNOSIS — R748 Abnormal levels of other serum enzymes: Secondary | ICD-10-CM | POA: Diagnosis present

## 2023-05-13 DIAGNOSIS — K5730 Diverticulosis of large intestine without perforation or abscess without bleeding: Secondary | ICD-10-CM | POA: Diagnosis not present

## 2023-05-13 DIAGNOSIS — E236 Other disorders of pituitary gland: Secondary | ICD-10-CM

## 2023-05-13 DIAGNOSIS — E1140 Type 2 diabetes mellitus with diabetic neuropathy, unspecified: Secondary | ICD-10-CM | POA: Diagnosis present

## 2023-05-13 DIAGNOSIS — E7800 Pure hypercholesterolemia, unspecified: Secondary | ICD-10-CM | POA: Diagnosis not present

## 2023-05-13 DIAGNOSIS — K56609 Unspecified intestinal obstruction, unspecified as to partial versus complete obstruction: Secondary | ICD-10-CM

## 2023-05-13 DIAGNOSIS — Z6824 Body mass index (BMI) 24.0-24.9, adult: Secondary | ICD-10-CM | POA: Diagnosis not present

## 2023-05-13 DIAGNOSIS — Z6821 Body mass index (BMI) 21.0-21.9, adult: Secondary | ICD-10-CM

## 2023-05-13 DIAGNOSIS — E1165 Type 2 diabetes mellitus with hyperglycemia: Secondary | ICD-10-CM

## 2023-05-13 DIAGNOSIS — I6782 Cerebral ischemia: Secondary | ICD-10-CM | POA: Diagnosis not present

## 2023-05-13 DIAGNOSIS — W19XXXA Unspecified fall, initial encounter: Secondary | ICD-10-CM | POA: Diagnosis not present

## 2023-05-13 DIAGNOSIS — R54 Age-related physical debility: Secondary | ICD-10-CM | POA: Diagnosis present

## 2023-05-13 DIAGNOSIS — R1111 Vomiting without nausea: Secondary | ICD-10-CM | POA: Diagnosis not present

## 2023-05-13 DIAGNOSIS — Z1152 Encounter for screening for COVID-19: Secondary | ICD-10-CM

## 2023-05-13 DIAGNOSIS — Z8249 Family history of ischemic heart disease and other diseases of the circulatory system: Secondary | ICD-10-CM | POA: Diagnosis not present

## 2023-05-13 DIAGNOSIS — Z818 Family history of other mental and behavioral disorders: Secondary | ICD-10-CM

## 2023-05-13 DIAGNOSIS — N400 Enlarged prostate without lower urinary tract symptoms: Secondary | ICD-10-CM | POA: Diagnosis not present

## 2023-05-13 DIAGNOSIS — Z79899 Other long term (current) drug therapy: Secondary | ICD-10-CM

## 2023-05-13 DIAGNOSIS — Z981 Arthrodesis status: Secondary | ICD-10-CM | POA: Diagnosis not present

## 2023-05-13 DIAGNOSIS — Z4803 Encounter for change or removal of drains: Secondary | ICD-10-CM | POA: Diagnosis not present

## 2023-05-13 DIAGNOSIS — E559 Vitamin D deficiency, unspecified: Secondary | ICD-10-CM | POA: Diagnosis not present

## 2023-05-13 DIAGNOSIS — R1312 Dysphagia, oropharyngeal phase: Secondary | ICD-10-CM | POA: Diagnosis not present

## 2023-05-13 DIAGNOSIS — N3010 Interstitial cystitis (chronic) without hematuria: Secondary | ICD-10-CM | POA: Diagnosis present

## 2023-05-13 DIAGNOSIS — E1142 Type 2 diabetes mellitus with diabetic polyneuropathy: Secondary | ICD-10-CM | POA: Diagnosis not present

## 2023-05-13 LAB — COMPREHENSIVE METABOLIC PANEL
ALT: 32 U/L (ref 0–44)
AST: 20 U/L (ref 15–41)
Albumin: 2.4 g/dL — ABNORMAL LOW (ref 3.5–5.0)
Alkaline Phosphatase: 120 U/L (ref 38–126)
Anion gap: 13 (ref 5–15)
BUN: 23 mg/dL (ref 8–23)
CO2: 16 mmol/L — ABNORMAL LOW (ref 22–32)
Calcium: 9.4 mg/dL (ref 8.9–10.3)
Chloride: 111 mmol/L (ref 98–111)
Creatinine, Ser: 1.14 mg/dL (ref 0.61–1.24)
GFR, Estimated: >60 mL/min (ref >60)
Glucose, Bld: 239 mg/dL — ABNORMAL HIGH (ref 70–99)
Potassium: 3.7 mmol/L (ref 3.5–5.1)
Sodium: 140 mmol/L (ref 135–145)
Total Bilirubin: 0.7 mg/dL (ref 0.0–1.2)
Total Protein: 5.5 g/dL — ABNORMAL LOW (ref 6.5–8.1)

## 2023-05-13 LAB — CBC WITH DIFFERENTIAL/PLATELET
Abs Immature Granulocytes: 0.15 10*3/uL — ABNORMAL HIGH (ref 0.00–0.07)
Basophils Absolute: 0.1 10*3/uL (ref 0.0–0.1)
Basophils Relative: 0 %
Eosinophils Absolute: 0 10*3/uL (ref 0.0–0.5)
Eosinophils Relative: 0 %
HCT: 36.6 % — ABNORMAL LOW (ref 39.0–52.0)
Hemoglobin: 12 g/dL — ABNORMAL LOW (ref 13.0–17.0)
Immature Granulocytes: 1 %
Lymphocytes Relative: 3 %
Lymphs Abs: 0.5 10*3/uL — ABNORMAL LOW (ref 0.7–4.0)
MCH: 30.2 pg (ref 26.0–34.0)
MCHC: 32.8 g/dL (ref 30.0–36.0)
MCV: 92.2 fL (ref 80.0–100.0)
Monocytes Absolute: 0.9 10*3/uL (ref 0.1–1.0)
Monocytes Relative: 6 %
Neutro Abs: 13.7 10*3/uL — ABNORMAL HIGH (ref 1.7–7.7)
Neutrophils Relative %: 90 %
Platelets: 467 10*3/uL — ABNORMAL HIGH (ref 150–400)
RBC: 3.97 MIL/uL — ABNORMAL LOW (ref 4.22–5.81)
RDW: 15.1 % (ref 11.5–15.5)
WBC: 15.3 10*3/uL — ABNORMAL HIGH (ref 4.0–10.5)
nRBC: 0 % (ref 0.0–0.2)

## 2023-05-13 LAB — RESP PANEL BY RT-PCR (RSV, FLU A&B, COVID)  RVPGX2
Influenza A by PCR: NEGATIVE
Influenza B by PCR: NEGATIVE
Resp Syncytial Virus by PCR: NEGATIVE
SARS Coronavirus 2 by RT PCR: NEGATIVE

## 2023-05-13 LAB — C DIFFICILE QUICK SCREEN W PCR REFLEX
C Diff antigen: NEGATIVE
C Diff interpretation: NOT DETECTED
C Diff toxin: NEGATIVE

## 2023-05-13 LAB — LIPASE, BLOOD: Lipase: 32 U/L (ref 11–51)

## 2023-05-13 LAB — POC OCCULT BLOOD, ED: Fecal Occult Bld: NEGATIVE

## 2023-05-13 LAB — I-STAT CG4 LACTIC ACID, ED: Lactic Acid, Venous: 1.4 mmol/L (ref 0.5–1.9)

## 2023-05-13 MED ORDER — IOHEXOL 350 MG/ML SOLN
75.0000 mL | Freq: Once | INTRAVENOUS | Status: AC | PRN
  Administered 2023-05-13: 75 mL via INTRAVENOUS

## 2023-05-13 MED ORDER — SODIUM CHLORIDE 0.9 % IV BOLUS
1000.0000 mL | Freq: Once | INTRAVENOUS | Status: AC
  Administered 2023-05-13: 1000 mL via INTRAVENOUS

## 2023-05-13 NOTE — ED Provider Notes (Signed)
[place] EMERGENCY DEPARTMENT AT [facility] Provider Note   CSN: [id number] Arrival date & time: 05/13/23  1826     History  Chief Complaint  Patient presents with   Diarrhea    Andrew Tanner is a 73 y.o. male here for evaluation of multiple complaints.  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  Discharged 1/29.  States over the last 2 days patient has had worsening weakness.  Only able to tolerate small sips of liquids and crackers.  He has been having profuse diarrhea per wife.  Stool dark in color almost black according to wife.  He has felt like she is needed to "catch his breath."  Over the last few days.  Very weak.  He had a fall yesterday.  EMS came out he had normal vital signs and subsequent is not transferred to the emergency department.  Wife states he has had some confusion as well.  He has 1 drain to his right abdomen which has been draining about 100 cc of cloudy, white drainage.  No fever.  Family unsure if he was on antibiotics in the hospital.  Looks like on his last hospital note with Dr. Donell Beers that had started on creon for diarrhea as well as Megace for appetite.  Looks like he is on Rocephin for pancreatic leak  HPI     Home Medications Prior to Admission medications   Medication Sig Start Date End Date Taking? Authorizing Provider  alfuzosin (UROXATRAL) 10 MG 24 hr tablet Take 1 tablet (10 mg total) by mouth daily with breakfast. 03/20/23  Yes Jeani Sow, MD  clonazePAM (KLONOPIN) 0.5 MG tablet Take 1.5 mg by mouth at bedtime.   Yes [provider]  Continuous Glucose Sensor (DEXCOM G7 SENSOR) MISC Use to monitor glucose continuously, change every 10 days 02/22/23  Yes Carlus Pavlov, MD  FLUoxetine (PROZAC) 20 MG capsule Take 60 mg by mouth every morning. 12/19/21  Yes [provider]  hydrOXYzine (ATARAX/VISTARIL) 50 MG tablet Take 50 mg by mouth at bedtime as needed (sleep/anxiety.). 12/16/20  Yes [provider]  insulin glargine (LANTUS SOLOSTAR) 100 UNIT/ML Solostar Pen Inject 10 Units into the skin at bedtime. 05/10/23  Yes [name], MD  Insulin Lispro-aabc (LYUMJEV KWIKPEN) 100 UNIT/ML KwikPen Inject 3-6 units under skin of insulin before dinner Patient taking differently: Inject 6 Units into the skin 2 (two) times daily before a meal. Before lunch and before supper 02/08/23  Yes Carlus Pavlov, MD  ketoconazole (NIZORAL) 2 % shampoo APPLY TO SCALP AND FACE 2 TO 3 TIMES A WEEK. 04/16/23  Yes Terri Piedra, DO  megestrol (MEGACE) 400 MG/10ML suspension Take 10 mLs (400 mg total) by mouth daily. 05/10/23  Yes [name], MD  methocarbamol (ROBAXIN) 500 MG tablet Take 1 tablet (500 mg total) by mouth every 6 (six) hours as needed for muscle spasms (pain). 05/10/23  Yes [name], MD  nystatin-triamcinolone ointment (MYCOLOG) Apply to scalp and face twice a day for up to 1 week and stop Patient taking differently: Apply 1 Application topically See admin instructions. Apply to scalp/face up to twice weekly when needed for skin irritation/flakes. 11/23/22  Yes Terri Piedra, DO  Pancrelipase, Lip-Prot-Amyl, 24000-76000 units CPEP Take 1 capsule (24,000 Units total) by mouth 3 (three) times daily before meals. 05/10/23  Yes [name], MD  pantoprazole (PROTONIX) 40 MG tablet Take 1 tablet (40 mg total) by mouth at bedtime. 05/10/23  Yes [name],  MD  polyethylene glycol (MIRALAX / GLYCOLAX) 17 g packet Take 17 g by mouth daily as needed (constipation.).   Yes [provider]  rosuvastatin (CRESTOR) 20 MG tablet Take 1 tablet (20 mg total) by mouth daily. 05/10/23  Yes [name], MD  simethicone (MYLICON) 80 MG chewable tablet Chew 1 tablet (80 mg total) by mouth every 6 (six) hours as needed for flatulence. 05/10/23  Yes [name], MD  acetaminophen (TYLENOL) 500 MG tablet Take 2 tablets (1,000 mg total) by mouth every 6 (six) hours as needed for mild pain  (pain score 1-3). Patient not taking: Reported on 05/13/2023 05/10/23   [name], MD  B-D UF III MINI PEN NEEDLES 31G X 5 MM MISC USE DAILY AT 12 NOON 03/31/23   Jeani Sow, MD  feeding supplement (ENSURE ENLIVE / ENSURE PLUS) LIQD Take 237 mLs by mouth 2 (two) times daily between meals. 05/10/23   [name], MD  glucose blood test strip 1 each by Other route in the morning and at bedtime. Use as instructed checking once per day, or if new symptoms. 01/16/23   Jeani Sow, MD  Insulin Pen Needle 32G X 4 MM MISC Use 2x a day 02/08/23   Carlus Pavlov, MD  oxyCODONE (OXY IR/ROXICODONE) 5 MG immediate release tablet Take 1 tablet (5 mg total) by mouth every 4 (four) hours as needed for severe pain (pain score 7-10), breakthrough pain or moderate pain (pain score 4-6). Patient not taking: Reported on 05/13/2023 05/10/23   [name], MD  prochlorperazine (COMPAZINE) 10 MG tablet Take 1 tablet (10 mg total) by mouth every 6 (six) hours as needed for nausea or vomiting (Use for nausea and / or vomiting unresolved with ondansetron (Zofran).). 05/10/23   [name], MD      Allergies    Patient has no known allergies.    Review of Systems   Review of Systems  Constitutional:  Positive for activity change, appetite change and fatigue.  HENT: Negative.    Respiratory:  Positive for shortness of breath. Negative for cough.   Cardiovascular: Negative.   Gastrointestinal:  Positive for diarrhea and nausea. Negative for vomiting.  Genitourinary: Negative.   Musculoskeletal: Negative.   Neurological:  Positive for weakness.  All other systems reviewed and are negative.   Physical Exam Updated Vital Signs BP 130/76 (BP Location: Right Arm)   Pulse 99   Temp 98.1 F (36.7 C) (Oral)   Resp 17   Ht 5\' 11"  (1.803 m)   Wt 80.7 kg   SpO2 100%   BMI 24.83 kg/m  Physical Exam Vitals and nursing note reviewed.  Constitutional:      General: He is not in acute distress.     Appearance: He is well-developed. He is ill-appearing. He is not toxic-appearing or diaphoretic.  HENT:     Head: Normocephalic and atraumatic.     Nose: Nose normal.     Mouth/Throat:     Mouth: Mucous membranes are dry.  Eyes:     Pupils: Pupils are equal, round, and reactive to light.  Cardiovascular:     Rate and Rhythm: Normal rate and regular rhythm.     Pulses: Normal pulses.          Radial pulses are 2+ on the right side and 2+ on the left side.       Dorsalis pedis pulses are 2+ on the right side and 2+ on the left side.  Heart sounds: Normal heart sounds.  Pulmonary:     Effort: Pulmonary effort is normal. No respiratory distress.     Breath sounds: Normal breath sounds.  Abdominal:     General: Bowel sounds are normal. There is no distension.     Palpations: Abdomen is soft.     Tenderness: There is no abdominal tenderness.     Comments: Soft, nontender, incision clean, dry, intact.  Drain to right abdomen cloudy, white in color.  Musculoskeletal:        General: No swelling, tenderness, deformity or signs of injury. Normal range of motion.     Cervical back: Normal range of motion and neck supple.     Right lower leg: No edema.     Left lower leg: No edema.  Skin:    General: Skin is warm and dry.     Capillary Refill: Capillary refill takes less than 2 seconds.  Neurological:     General: No focal deficit present.     Mental Status: He is alert and oriented to person, place, and time.     Cranial Nerves: No cranial nerve deficit.     Sensory: No sensory deficit.     Motor: Weakness present.     Coordination: Coordination normal.     Gait: Gait abnormal.     ED Results / Procedures / Treatments   Labs (all labs ordered are listed, but only abnormal results are displayed) Labs Reviewed  COMPREHENSIVE METABOLIC PANEL - Abnormal; Notable for the following components:      Result Value   CO2 16 (*)    Glucose, Bld 239 (*)    Total Protein 5.5 (*)     Albumin 2.4 (*)    All other components within normal limits  CBC WITH DIFFERENTIAL/PLATELET - Abnormal; Notable for the following components:   WBC 15.3 (*)    RBC 3.97 (*)    Hemoglobin 12.0 (*)    HCT 36.6 (*)    Platelets 467 (*)    Neutro Abs 13.7 (*)    Lymphs Abs 0.5 (*)    Abs Immature Granulocytes 0.15 (*)    All other components within normal limits  RESP PANEL BY RT-PCR (RSV, FLU A&B, COVID)  RVPGX2  C DIFFICILE QUICK SCREEN W PCR REFLEX    CULTURE, BLOOD (ROUTINE X 2)  CULTURE, BLOOD (ROUTINE X 2)  GASTROINTESTINAL PANEL BY PCR, STOOL (REPLACES STOOL CULTURE)  LIPASE, BLOOD  URINALYSIS, W/ REFLEX TO CULTURE (INFECTION SUSPECTED)  I-STAT CG4 LACTIC ACID, ED  POC OCCULT BLOOD, ED    EKG None  Radiology CT Angio Chest PE W and/or Wo Contrast Result Date: 05/13/2023 CLINICAL DATA:  Fall recent surgery diarrhea EXAM: CT ANGIOGRAPHY CHEST CT ABDOMEN AND PELVIS WITH CONTRAST TECHNIQUE: Multidetector CT imaging of the chest was performed using the standard protocol during bolus administration of intravenous contrast. Multiplanar CT image reconstructions and MIPs were obtained to evaluate the vascular anatomy. Multidetector CT imaging of the abdomen and pelvis was performed using the standard protocol during bolus administration of intravenous contrast. RADIATION DOSE REDUCTION: This exam was performed according to the departmental dose-optimization program which includes automated exposure control, adjustment of the mA and/or kV according to patient size and/or use of iterative reconstruction technique. CONTRAST:  75mL OMNIPAQUE IOHEXOL 350 MG/ML SOLN COMPARISON:  Chest x-ray 05/13/2023, CT 12/08/2022 FINDINGS: CTA CHEST FINDINGS Cardiovascular: Satisfactory opacification of the pulmonary arteries to the segmental level. No evidence of pulmonary embolism. Mild atherosclerosis. No aneurysm. Mild coronary  vascular calcification. Normal cardiac size. No significant pericardial effusion  Mediastinum/Nodes: Patent trachea. Left thyroid nodule measuring 2.6 cm. No suspicious lymph nodes. Mild diffuse esophageal thickening. Lungs/Pleura: No acute airspace disease, or pneumothorax. Small left greater than right pleural effusion. Mild dependent atelectasis in the left lower lobe. Musculoskeletal: Sternum appears intact. No acute osseous abnormality Review of the MIP images confirms the above findings. CT ABDOMEN and PELVIS FINDINGS Hepatobiliary: Status post cholecystectomy. Minimal periportal edema. Common bile duct measures 9 mm. Mild enhancement the ductal walls on coronal series 11, image 58. Status post choledochal jejunostomy Pancreas: Interval [facility] procedure, placement of pancreatic stent. Multiple cystic densities at the residual pancreatic tail and body. Diffuse ductal dilatation with a few pancreatic calcifications consistent with chronic pancreatitis. No acute inflammation. Pancreaticojejunostomy with the stent extending from the jejunum to the mid pancreatic duct. Spleen: Small peripheral hypodensities at the superior, posterior and medial spleen which may relate to small infarcts, not seen previously. Adrenals/Urinary Tract: Adrenal glands are normal. Right renal cysts for which no imaging follow-up is recommended. No hydronephrosis. The bladder is unremarkable Stomach/Bowel: Status post partial gastrectomy with gastro jejunostomy. Marked fluid distension of the stomach. Small bowel distal to the anastomosis is decompressed. Diffuse colon wall thickening with mucosal enhancement. Negative appendix. Vascular/Lymphatic: Moderate aortic atherosclerosis. No aneurysm. No suspicious lymph nodes. Reproductive: Enlarged prostate Other: No free air. Minimal fluid adjacent to the liver and in the right gutter. Right abdominal drainage catheter with tip in the gastrohepatic region. Small fluid collection measuring 2.7 x 1.7 cm near the pancreatico jejunal anastomosis, series 7, image 34. mild  generalized stranding in the mesentery and upper abdomen likely due to residual postoperative change. Musculoskeletal: No acute or suspicious osseous abnormality. Review of the MIP images confirms the above findings. IMPRESSION: 1. Negative for acute pulmonary embolus. 2. Small left greater than right pleural effusions with mild dependent atelectasis in the left lower lobe. 3. Interval [facility] procedure with placement of pancreatic stent as above. Marked fluid distension of the stomach with decompressed small bowel distal to the gastrojejunal anastomosis, suggesting a degree of obstruction, may be related to postoperative edema at the anastomosis. 4. Small fluid collection measuring 2.7 x 1.7 cm near the pancreatico jejunal anastomosis, possible postoperative fluid collection. 5. Diffuse colon wall thickening with mucosal enhancement suspicious for colitis 6. Small peripheral hypodensities at the superior, posterior and medial spleen which may relate to small infarcts, not seen previously. 7. Status post cholecystectomy. Mild enhancement of the common bile duct walls, possibly inflammatory and related to recent choledochal jejunostomy 8. Mild diffuse esophageal thickening, question esophagitis. 9. Left thyroid nodule measuring 2.6 cm. Recommend thyroid US (ref: J Am Coll Radiol. 2015 Feb;12(2): 143-50).This should be performed on a nonemergent basis 10. Aortic atherosclerosis. Aortic Atherosclerosis (ICD10-I70.0). Electronically Signed   By: Jasmine Pang M.D.   On: 05/13/2023 22:13   CT ABDOMEN PELVIS W CONTRAST Result Date: 05/13/2023 CLINICAL DATA:  Fall recent surgery diarrhea EXAM: CT ANGIOGRAPHY CHEST CT ABDOMEN AND PELVIS WITH CONTRAST TECHNIQUE: Multidetector CT imaging of the chest was performed using the standard protocol during bolus administration of intravenous contrast. Multiplanar CT image reconstructions and MIPs were obtained to evaluate the vascular anatomy. Multidetector CT imaging of the  abdomen and pelvis was performed using the standard protocol during bolus administration of intravenous contrast. RADIATION DOSE REDUCTION: This exam was performed according to the departmental dose-optimization program which includes automated exposure control, adjustment of the mA and/or kV according to patient size and/or use  of iterative reconstruction technique. CONTRAST:  75mL OMNIPAQUE IOHEXOL 350 MG/ML SOLN COMPARISON:  Chest x-ray 05/13/2023, CT 12/08/2022 FINDINGS: CTA CHEST FINDINGS Cardiovascular: Satisfactory opacification of the pulmonary arteries to the segmental level. No evidence of pulmonary embolism. Mild atherosclerosis. No aneurysm. Mild coronary vascular calcification. Normal cardiac size. No significant pericardial effusion Mediastinum/Nodes: Patent trachea. Left thyroid nodule measuring 2.6 cm. No suspicious lymph nodes. Mild diffuse esophageal thickening. Lungs/Pleura: No acute airspace disease, or pneumothorax. Small left greater than right pleural effusion. Mild dependent atelectasis in the left lower lobe. Musculoskeletal: Sternum appears intact. No acute osseous abnormality Review of the MIP images confirms the above findings. CT ABDOMEN and PELVIS FINDINGS Hepatobiliary: Status post cholecystectomy. Minimal periportal edema. Common bile duct measures 9 mm. Mild enhancement the ductal walls on coronal series 11, image 58. Status post choledochal jejunostomy Pancreas: Interval [facility] procedure, placement of pancreatic stent. Multiple cystic densities at the residual pancreatic tail and body. Diffuse ductal dilatation with a few pancreatic calcifications consistent with chronic pancreatitis. No acute inflammation. Pancreaticojejunostomy with the stent extending from the jejunum to the mid pancreatic duct. Spleen: Small peripheral hypodensities at the superior, posterior and medial spleen which may relate to small infarcts, not seen previously. Adrenals/Urinary Tract: Adrenal glands are  normal. Right renal cysts for which no imaging follow-up is recommended. No hydronephrosis. The bladder is unremarkable Stomach/Bowel: Status post partial gastrectomy with gastro jejunostomy. Marked fluid distension of the stomach. Small bowel distal to the anastomosis is decompressed. Diffuse colon wall thickening with mucosal enhancement. Negative appendix. Vascular/Lymphatic: Moderate aortic atherosclerosis. No aneurysm. No suspicious lymph nodes. Reproductive: Enlarged prostate Other: No free air. Minimal fluid adjacent to the liver and in the right gutter. Right abdominal drainage catheter with tip in the gastrohepatic region. Small fluid collection measuring 2.7 x 1.7 cm near the pancreatico jejunal anastomosis, series 7, image 34. mild generalized stranding in the mesentery and upper abdomen likely due to residual postoperative change. Musculoskeletal: No acute or suspicious osseous abnormality. Review of the MIP images confirms the above findings. IMPRESSION: 1. Negative for acute pulmonary embolus. 2. Small left greater than right pleural effusions with mild dependent atelectasis in the left lower lobe. 3. Interval [facility] procedure with placement of pancreatic stent as above. Marked fluid distension of the stomach with decompressed small bowel distal to the gastrojejunal anastomosis, suggesting a degree of obstruction, may be related to postoperative edema at the anastomosis. 4. Small fluid collection measuring 2.7 x 1.7 cm near the pancreatico jejunal anastomosis, possible postoperative fluid collection. 5. Diffuse colon wall thickening with mucosal enhancement suspicious for colitis 6. Small peripheral hypodensities at the superior, posterior and medial spleen which may relate to small infarcts, not seen previously. 7. Status post cholecystectomy. Mild enhancement of the common bile duct walls, possibly inflammatory and related to recent choledochal jejunostomy 8. Mild diffuse esophageal thickening,  question esophagitis. 9. Left thyroid nodule measuring 2.6 cm. Recommend thyroid US (ref: J Am Coll Radiol. 2015 Feb;12(2): 143-50).This should be performed on a nonemergent basis 10. Aortic atherosclerosis. Aortic Atherosclerosis (ICD10-I70.0). Electronically Signed   By: Jasmine Pang M.D.   On: 05/13/2023 22:13   CT HEAD WO CONTRAST ( ) Result Date: 05/13/2023 CLINICAL DATA:  Head trauma, moderate to severe. EXAM: CT HEAD WITHOUT CONTRAST TECHNIQUE: Contiguous axial images were obtained from the base of the skull through the vertex without intravenous contrast. RADIATION DOSE REDUCTION: This exam was performed according to the departmental dose-optimization program which includes automated exposure control, adjustment of the mA and/or kV  according to patient size and/or use of iterative reconstruction technique. COMPARISON:  11/09/2022 FINDINGS: Brain: Mild cerebral atrophy. Mild ventricular dilatation consistent with central atrophy. Patchy low-attenuation changes in the deep white matter likely representing small vessel ischemic change. No significant progression since the previous study. No mass-effect or midline shift. No abnormal extra-axial fluid collections. Gray-white matter junctions are distinct. Basal cisterns are not effaced. No acute intracranial hemorrhage. There is expansion of the sella with sellar mass measuring 1.5 cm diameter. Asymmetric extension towards the right cavernous sinus with bone remodeling and thinning. Appearance is similar to previous study. Is recommended in the prior study, MRI is recommended for further characterization in the nonemergent setting. Vascular: No hyperdense vessel or unexpected calcification. Skull: Normal. Negative for fracture or focal lesion. Sinuses/Orbits: Mucosal thickening in the paranasal sinuses. No acute air-fluid levels. Mastoid air cells are clear. Other: None. IMPRESSION: 1. No acute intracranial abnormalities. Mild atrophy and small vessel  ischemic changes. 2. 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 As previously recommended, nonemergent MRI is suggested for further characterization. Electronically Signed   By: Burman Nieves M.D.   On: 05/13/2023 21:51   CT Cervical Spine Wo Contrast Result Date: 05/13/2023 CLINICAL DATA:  Neck trauma with midline tenderness. EXAM: CT CERVICAL SPINE WITHOUT CONTRAST TECHNIQUE: Multidetector CT imaging of the cervical spine was performed without intravenous contrast. Multiplanar CT image reconstructions were also generated. RADIATION DOSE REDUCTION: This exam was performed according to the departmental dose-optimization program which includes automated exposure control, adjustment of the mA and/or kV according to patient size and/or use of iterative reconstruction technique. COMPARISON:  CT cervical spine 04/14/2004. MRI cervical spine 06/28/2017. Cervical spine radiographs 07/22/2020 FINDINGS: Alignment: Normal alignment without change since prior studies. Skull base and vertebrae: Skull base appears intact. No vertebral compression deformities. No focal bone lesion or bone destruction. Postoperative changes with anterior plate and screw fixation and intervertebral fusion from C4 through C6. Surgical screws in the posterior elements from C3 through C7. Soft tissues and spinal canal: No prevertebral fluid or swelling. No visible canal hematoma. Disc levels: Intervertebral fusion at C4-5 and C5-6 levels. Degenerative changes in the remainder of the cervical spine with narrowed interspaces and endplate osteophyte formation. Degenerative changes in the facet joints. Upper chest: Lung apices are clear. Left thyroid gland nodule measuring 2.7 cm diameter. Other: None. IMPRESSION: 1. No acute displaced fractures demonstrated in the cervical spine. Normal alignment. 2. Mild degenerative changes. 3. Postoperative changes as described. 4. Incidental left thyroid nodule measuring 2.7 cm. Recommend nonemergent  thyroid ultrasound. Reference: J Am Coll Radiol. 2015 Feb;12(2): 143-50 Electronically Signed   By: Burman Nieves M.D.   On: 05/13/2023 21:47   DG Chest Portable 1 View Result Date: 05/13/2023 CLINICAL DATA:  Shortness of breath EXAM: PORTABLE CHEST 1 VIEW COMPARISON:  11/09/2022 FINDINGS: The heart size and mediastinal contours are within normal limits. Both lungs are clear. The visualized skeletal structures are unremarkable. IMPRESSION: No active disease. Electronically Signed   By: Minerva Fester M.D.   On: 05/13/2023 21:01    Procedures Procedures    Medications Ordered in ED Medications  sodium chloride 0.9 % bolus 1,000 mL (0 mLs Intravenous Stopped 05/13/23 2149)  iohexol (OMNIPAQUE) 350 MG/ML injection 75 mL (75 mLs Intravenous Contrast Given 05/13/23 2137)   ED Course/ Medical Decision Making/ A&P Clinical Course as of 05/13/23 2312  Sat May 13, 2023  2253 Dr. 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 intake, diarrhea, weakness and intermittent confusion.  Patient had a fall yesterday.  Wife states he has had persistent loose stool, black in color since discharge from the hospital.  He has not been doing well, significant decreased p.o. intake.  He has a nonfocal neuroexam without deficits.  Wife unsure if he hit his head when he fell.  He has no obvious traumatic injury.  His wounds are C/D/I.  He has drain to his right lower abdomen which is cloudy in color.  Will plan on broad workup.   Labs and imaging personally viewed and  interpreted:  Lactic 1.4 Occult negative CBC leukocytosis 15.3, hemoglobin 12.0, similar to hospital labs Lipase 32 CMP hyperglycemia 239 C. difficile negative CT head 1.5 cm sellar mass, seen previously, no acute abnormality CT cervical spine with  thyroid nodule CTA chest with pleural effusions, no PE CT abdomen pelvis shows possible obstruction due to postop edema, possible splenic infarct, esophagitis, colitis  Discussed result with patient, family in room.  He does appear very dry, globally weak.  Will touch base with general surgery for admission  Discussed with Dr. Janee Morn who is agreeable to evaluate patient for admission.  The patient appears reasonably stabilized for admission considering the current resources, flow, and capabilities available in the ED at this time, and I doubt any other [facility] requiring further screening and/or treatment in the ED prior to admission.                                  Medical Decision Making Amount and/or Complexity of Data Reviewed Independent Historian: spouse External Data Reviewed: labs, radiology, ECG and notes. Labs: ordered. Decision-making details documented in ED Course. Radiology: ordered and independent interpretation performed. Decision-making details documented in ED Course. ECG/medicine tests: ordered and independent interpretation performed. Decision-making details documented in ED Course.  Risk OTC drugs. Prescription drug management. Parenteral controlled substances. Decision regarding hospitalization. Diagnosis or treatment significantly limited by social determinants of health.          Final Clinical Impression(s) / ED Diagnoses Final diagnoses:  Thyroid nodule  Pituitary mass (HCC)  Diarrhea, unspecified type  Dehydration  Weakness  Pleural effusion  Colitis  Intestinal obstruction, unspecified cause, unspecified whether partial or complete [facility])    Rx / DC Orders ED Discharge Orders     None         Marvelyn Bouchillon A, PA-C 05/13/23 2312    Kommor, Wyn Forster, MD 05/14/23 [phone]

## 2023-05-13 NOTE — ED Triage Notes (Addendum)
PT here via [facility] from home.  15 days post-op from whipple procedure. Diarrhea x 2 days.  Pt incontinent.  Wife states pt has been very weak, not eating.  Also short term memory lapses the last few days.  Vs  125/79 107 hr 98% RA CBG 97%  20 L AC

## 2023-05-13 NOTE — ED Notes (Signed)
Patient's wife advised that she would like a call with updates as available.

## 2023-05-14 LAB — BASIC METABOLIC PANEL
Anion gap: 12 (ref 5–15)
BUN: 14 mg/dL (ref 8–23)
CO2: 16 mmol/L — ABNORMAL LOW (ref 22–32)
Calcium: 9 mg/dL (ref 8.9–10.3)
Chloride: 110 mmol/L (ref 98–111)
Creatinine, Ser: 0.78 mg/dL (ref 0.61–1.24)
GFR, Estimated: >60 mL/min (ref >60)
Glucose, Bld: 155 mg/dL — ABNORMAL HIGH (ref 70–99)
Potassium: 4.1 mmol/L (ref 3.5–5.1)
Sodium: 138 mmol/L (ref 135–145)

## 2023-05-14 LAB — CBC
HCT: 33.2 % — ABNORMAL LOW (ref 39.0–52.0)
HCT: 35.4 % — ABNORMAL LOW (ref 39.0–52.0)
Hemoglobin: 10.6 g/dL — ABNORMAL LOW (ref 13.0–17.0)
Hemoglobin: 11.5 g/dL — ABNORMAL LOW (ref 13.0–17.0)
MCH: 29.3 pg (ref 26.0–34.0)
MCH: 29.6 pg (ref 26.0–34.0)
MCHC: 31.9 g/dL (ref 30.0–36.0)
MCHC: 32.5 g/dL (ref 30.0–36.0)
MCV: 91.7 fL (ref 80.0–100.0)
MCV: 91 fL (ref 80.0–100.0)
Platelets: 490 10*3/uL — ABNORMAL HIGH (ref 150–400)
Platelets: 427 10*3/uL — ABNORMAL HIGH (ref 150–400)
RBC: 3.62 MIL/uL — ABNORMAL LOW (ref 4.22–5.81)
RBC: 3.89 MIL/uL — ABNORMAL LOW (ref 4.22–5.81)
RDW: 14.9 % (ref 11.5–15.5)
RDW: 15.2 % (ref 11.5–15.5)
WBC: 10.7 10*3/uL — ABNORMAL HIGH (ref 4.0–10.5)
WBC: 11.1 10*3/uL — ABNORMAL HIGH (ref 4.0–10.5)
nRBC: 0 % (ref 0.0–0.2)
nRBC: 0 % (ref 0.0–0.2)

## 2023-05-14 LAB — GASTROINTESTINAL PANEL BY PCR, STOOL (REPLACES STOOL CULTURE)

## 2023-05-14 LAB — URINALYSIS, W/ REFLEX TO CULTURE (INFECTION SUSPECTED)
Bacteria, UA: NONE SEEN
Bilirubin Urine: NEGATIVE
Glucose, UA: 50 mg/dL — AB
Ketones, ur: 20 mg/dL — AB
Leukocytes,Ua: NEGATIVE
Nitrite: NEGATIVE
Protein, ur: 30 mg/dL — AB
Specific Gravity, Urine: >1.046 — ABNORMAL HIGH (ref 1.005–1.030)
pH: 5 (ref 5.0–8.0)

## 2023-05-14 LAB — CREATININE, SERUM
Creatinine, Ser: 0.86 mg/dL (ref 0.61–1.24)
GFR, Estimated: >60 mL/min (ref >60)

## 2023-05-14 LAB — GLUCOSE, CAPILLARY
Glucose-Capillary: 161 mg/dL — ABNORMAL HIGH (ref 70–99)
Glucose-Capillary: 145 mg/dL — ABNORMAL HIGH (ref 70–99)

## 2023-05-14 LAB — CBG MONITORING, ED: Glucose-Capillary: 141 mg/dL — ABNORMAL HIGH (ref 70–99)

## 2023-05-14 MED ORDER — INSULIN ASPART 100 UNIT/ML IJ SOLN
0.0000 [IU] | Freq: Three times a day (TID) | INTRAMUSCULAR | Status: DC
  Administered 2023-05-14: 2 [IU] via SUBCUTANEOUS
  Administered 2023-05-14: 3 [IU] via SUBCUTANEOUS
  Administered 2023-05-15: 2 [IU] via SUBCUTANEOUS
  Administered 2023-05-15: 3 [IU] via SUBCUTANEOUS
  Administered 2023-05-15 – 2023-05-16 (×3): 2 [IU] via SUBCUTANEOUS
  Administered 2023-05-16: 1 [IU] via SUBCUTANEOUS
  Administered 2023-05-17 (×3): 3 [IU] via SUBCUTANEOUS
  Administered 2023-05-18: 2 [IU] via SUBCUTANEOUS
  Administered 2023-05-18 – 2023-05-20 (×6): 3 [IU] via SUBCUTANEOUS
  Administered 2023-05-20: 5 [IU] via SUBCUTANEOUS
  Administered 2023-05-20 – 2023-05-21 (×3): 3 [IU] via SUBCUTANEOUS
  Administered 2023-05-21: 2 [IU] via SUBCUTANEOUS
  Administered 2023-05-22 (×3): 3 [IU] via SUBCUTANEOUS
  Administered 2023-05-23 (×2): 5 [IU] via SUBCUTANEOUS
  Administered 2023-05-23: 3 [IU] via SUBCUTANEOUS
  Administered 2023-05-24: 8 [IU] via SUBCUTANEOUS
  Administered 2023-05-24: 5 [IU] via SUBCUTANEOUS
  Administered 2023-05-24: 8 [IU] via SUBCUTANEOUS
  Administered 2023-05-25 – 2023-05-26 (×4): 5 [IU] via SUBCUTANEOUS

## 2023-05-14 MED ORDER — ACETAMINOPHEN 500 MG PO TABS
1000.0000 mg | ORAL_TABLET | Freq: Four times a day (QID) | ORAL | Status: DC | PRN
  Administered 2023-05-16 – 2023-06-05 (×6): 1000 mg via ORAL
  Filled 2023-05-14 (×7): qty 2

## 2023-05-14 MED ORDER — CIPROFLOXACIN IN D5W 400 MG/200ML IV SOLN
400.0000 mg | Freq: Two times a day (BID) | INTRAVENOUS | Status: DC
Start: 2023-05-14 — End: 2023-05-17
  Administered 2023-05-14 – 2023-05-17 (×7): 400 mg via INTRAVENOUS
  Filled 2023-05-14 (×7): qty 200

## 2023-05-14 MED ORDER — PANCRELIPASE (LIP-PROT-AMYL) 12000-38000 UNITS PO CPEP
12000.0000 [IU] | ORAL_CAPSULE | Freq: Three times a day (TID) | ORAL | Status: DC
  Administered 2023-05-14 – 2023-05-16 (×4): 12000 [IU] via ORAL
  Filled 2023-05-14 (×9): qty 1

## 2023-05-14 MED ORDER — MEGESTROL ACETATE 400 MG/10ML PO SUSP
400.0000 mg | Freq: Every day | ORAL | Status: DC
  Administered 2023-05-14 – 2023-06-09 (×27): 400 mg via ORAL
  Filled 2023-05-14 (×29): qty 10

## 2023-05-14 MED ORDER — METHOCARBAMOL 500 MG PO TABS
500.0000 mg | ORAL_TABLET | Freq: Four times a day (QID) | ORAL | Status: DC | PRN
  Administered 2023-05-20 – 2023-06-05 (×2): 500 mg via ORAL
  Filled 2023-05-14 (×2): qty 1

## 2023-05-14 MED ORDER — HYDRALAZINE HCL 20 MG/ML IJ SOLN: 10.0000 mg | INTRAMUSCULAR | Status: DC | PRN

## 2023-05-14 MED ORDER — PROCHLORPERAZINE MALEATE 10 MG PO TABS: 10.0000 mg | ORAL_TABLET | Freq: Four times a day (QID) | ORAL | Status: DC | PRN

## 2023-05-14 MED ORDER — OXYCODONE HCL 5 MG PO TABS
5.0000 mg | ORAL_TABLET | ORAL | Status: DC | PRN
Start: 2023-05-14 — End: 2023-06-09
  Administered 2023-05-29: 10 mg via ORAL
  Filled 2023-05-14: qty 1
  Filled 2023-05-14: qty 2
  Filled 2023-05-14: qty 1

## 2023-05-14 MED ORDER — MORPHINE SULFATE (PF) 2 MG/ML IV SOLN: 2.0000 mg | INTRAVENOUS | Status: DC | PRN

## 2023-05-14 MED ORDER — SIMETHICONE 80 MG PO CHEW
80.0000 mg | CHEWABLE_TABLET | Freq: Four times a day (QID) | ORAL | Status: DC | PRN
  Administered 2023-05-14 – 2023-06-07 (×4): 80 mg via ORAL
  Filled 2023-05-14 (×4): qty 1

## 2023-05-14 MED ORDER — FLUOXETINE HCL 20 MG PO CAPS
60.0000 mg | ORAL_CAPSULE | Freq: Every morning | ORAL | Status: DC
  Administered 2023-05-14 – 2023-06-09 (×27): 60 mg via ORAL
  Filled 2023-05-14 (×27): qty 3

## 2023-05-14 MED ORDER — HYDROXYZINE HCL 25 MG PO TABS
50.0000 mg | ORAL_TABLET | Freq: Every evening | ORAL | Status: DC | PRN
  Administered 2023-06-02: 50 mg via ORAL
  Filled 2023-05-14: qty 2

## 2023-05-14 MED ORDER — CLONAZEPAM 0.5 MG PO TABS
1.5000 mg | ORAL_TABLET | Freq: Every day | ORAL | Status: DC
  Administered 2023-05-14 – 2023-06-08 (×26): 1.5 mg via ORAL
  Filled 2023-05-14 (×26): qty 3

## 2023-05-14 MED ORDER — PANTOPRAZOLE SODIUM 40 MG PO TBEC
40.0000 mg | DELAYED_RELEASE_TABLET | Freq: Every day | ORAL | Status: DC
  Administered 2023-05-14 – 2023-06-01 (×19): 40 mg via ORAL
  Filled 2023-05-14 (×19): qty 1

## 2023-05-14 MED ORDER — METRONIDAZOLE 500 MG/100ML IV SOLN
500.0000 mg | Freq: Two times a day (BID) | INTRAVENOUS | Status: DC
  Administered 2023-05-14 – 2023-05-17 (×8): 500 mg via INTRAVENOUS
  Filled 2023-05-14 (×8): qty 100

## 2023-05-14 MED ORDER — ENOXAPARIN SODIUM 40 MG/0.4ML IJ SOSY
40.0000 mg | PREFILLED_SYRINGE | INTRAMUSCULAR | Status: DC
  Administered 2023-05-14 – 2023-06-09 (×27): 40 mg via SUBCUTANEOUS
  Filled 2023-05-14 (×27): qty 0.4

## 2023-05-14 MED ORDER — INSULIN ASPART 100 UNIT/ML IJ SOLN
0.0000 [IU] | Freq: Every day | INTRAMUSCULAR | Status: DC
  Administered 2023-05-23: 2 [IU] via SUBCUTANEOUS
  Administered 2023-05-25 – 2023-05-29 (×2): 3 [IU] via SUBCUTANEOUS

## 2023-05-14 MED ORDER — POTASSIUM CHLORIDE IN NACL 20-0.45 MEQ/L-% IV SOLN
INTRAVENOUS | Status: AC
  Filled 2023-05-14 (×4): qty 1000

## 2023-05-14 NOTE — H&P (Signed)
Andrew Tanner is an 73 y.o. male.   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 Donell Beers was D/C home 05/10/23. He C/O diarrhea since after surgery but he reports this got a lot worse at home.  He also had a fall today and came to the emergency department for further evaluation.  He denies nausea and vomiting.  He denies abdominal pain.  CT scan of the abdomen and pelvis shows diffuse colitis.  He has a small fluid collection near the pancreatico jejunal anastomosis.  I was asked to see him for admission.  Past Medical History:  Diagnosis Date   Anxiety    Atrial myxoma    Basal cell carcinoma    Cancer (HCC)    basal cell skin CA   Cataract    Depression    Diabetes mellitus without complication (HCC)    Diabetic neuropathy (HCC)    Duodenal adenoma    Hyperlipidemia    Hypertension    IC (interstitial cystitis)    Neuromuscular disorder (HCC) 01/09/21   Peripheral  Neuropathy   Pancreatic lesion    Squamous cell carcinoma of skin     Past Surgical History:  Procedure Laterality Date   BIOPSY  12/05/2022   Procedure: BIOPSY;  Surgeon: [name], MD;  Location: [facility];  Service: Gastroenterology;;   CATARACT EXTRACTION, BILATERAL     ESOPHAGOGASTRODUODENOSCOPY N/A 12/05/2022   Procedure: ESOPHAGOGASTRODUODENOSCOPY (EGD);  Surgeon: [name], MD;  Location: [facility];  Service: Gastroenterology;  Laterality: N/A;   EUS N/A 12/05/2022   Procedure: UPPER ENDOSCOPIC ULTRASOUND (EUS) RADIAL;  Surgeon: [name], MD;  Location: [facility];  Service: Gastroenterology;  Laterality: N/A;   FINE NEEDLE ASPIRATION N/A 12/05/2022   Procedure: FINE NEEDLE ASPIRATION (FNA) LINEAR;  Surgeon: [name], MD;  Location: [facility];  Service: Gastroenterology;  Laterality: N/A;   HERNIA REPAIR N/A    inguinal   NECK SURGERY     ant/post   SPINE SURGERY     SUBMUCOSAL TATTOO INJECTION   12/05/2022   Procedure: SUBMUCOSAL TATTOO INJECTION;  Surgeon: [name], MD;  Location: [facility];  Service: Gastroenterology;;   TRANSURETHRAL RESECTION OF PROSTATE     TURP VAPORIZATION     WHIPPLE PROCEDURE N/A 04/27/2023   Procedure: WHIPPLE PROCEDURE;  Surgeon: [name], MD;  Location: [facility];  Service: General;  Laterality: N/A;    Family History  Problem Relation Age of Onset   Lung cancer Mother    Heart disease Father    Hyperlipidemia Father    Diabetes Father        type 1   Diabetes Brother    Depression Brother    Renal cancer Brother    Anxiety disorder Brother    Stomach cancer Neg Hx    Colon cancer Neg Hx    Pancreatic cancer Neg Hx    Esophageal cancer Neg Hx    Rectal cancer Neg Hx    Social History:  reports that he quit smoking about 41 years ago. His smoking use included cigarettes. He started smoking about 46 years ago. He has a 5 pack-year smoking history. He has never used smokeless tobacco. He reports that he does not currently use alcohol. He reports that he does not use drugs.  Allergies: No Known Allergies  (Not in a hospital admission)   Results for orders placed or performed during the hospital encounter of 05/13/23 (from the past 48  hours)  Comprehensive metabolic panel     Status: Abnormal   Collection Time: 05/13/23  7:45 PM  Result Value Ref Range   Sodium 140 135 - 145 mmol/L   Potassium 3.7 3.5 - 5.1 mmol/L   Chloride 111 98 - 111 mmol/L   CO2 16 (L) 22 - 32 mmol/L   Glucose, Bld 239 (H) 70 - 99 mg/dL    Comment: Glucose reference range applies only to samples taken after fasting for at least 8 hours.   BUN 23 8 - 23 mg/dL   Creatinine, Ser 8.11 0.61 - 1.24 mg/dL   Calcium 9.4 8.9 - 91.4 mg/dL   Total Protein 5.5 (L) 6.5 - 8.1 g/dL   Albumin 2.4 (L) 3.5 - 5.0 g/dL   AST 20 15 - 41 U/L   ALT 32 0 - 44 U/L   Alkaline Phosphatase 120 38 - 126 U/L   Total Bilirubin 0.7 0.0 - 1.2 mg/dL   GFR, Estimated >78 >29  mL/min    Comment: (NOTE) Calculated using the CKD-EPI Creatinine Equation (2021)    Anion gap 13 5 - 15    Comment: Performed at [facility], [address] [place], [place] [address]  CBC with Differential     Status: Abnormal   Collection Time: 05/13/23  7:45 PM  Result Value Ref Range   WBC 15.3 (H) 4.0 - 10.5 K/uL   RBC 3.97 (L) 4.22 - 5.81 MIL/uL   Hemoglobin 12.0 (L) 13.0 - 17.0 g/dL   HCT 08.6 (L) 57.8 - 46.9 %   MCV 92.2 80.0 - 100.0 fL   MCH 30.2 26.0 - 34.0 pg   MCHC 32.8 30.0 - 36.0 g/dL   RDW 62.9 52.8 - 41.3 %   Platelets 467 (H) 150 - 400 K/uL   nRBC 0.0 0.0 - 0.2 %   Neutrophils Relative % 90 %   Neutro Abs 13.7 (H) 1.7 - 7.7 K/uL   Lymphocytes Relative 3 %   Lymphs Abs 0.5 (L) 0.7 - 4.0 K/uL   Monocytes Relative 6 %   Monocytes Absolute 0.9 0.1 - 1.0 K/uL   Eosinophils Relative 0 %   Eosinophils Absolute 0.0 0.0 - 0.5 K/uL   Basophils Relative 0 %   Basophils Absolute 0.1 0.0 - 0.1 K/uL   Immature Granulocytes 1 %   Abs Immature Granulocytes 0.15 (H) 0.00 - 0.07 K/uL    Comment: Performed at [facility], [address] [place], [place] [address]  Lipase, blood     Status: None   Collection Time: 05/13/23  7:45 PM  Result Value Ref Range   Lipase 32 11 - 51 U/L    Comment: Performed at [facility], [address] [place], [place] [address]  Resp panel by RT-PCR (RSV, Flu A&B, Covid) Anterior Nasal Swab     Status: None   Collection Time: 05/13/23  7:45 PM   Specimen: Anterior Nasal Swab  Result Value Ref Range   SARS Coronavirus 2 by RT PCR NEGATIVE NEGATIVE   Influenza A by PCR NEGATIVE NEGATIVE   Influenza B by PCR NEGATIVE NEGATIVE    Comment: (NOTE) The Xpert Xpress SARS-CoV-2/FLU/RSV plus assay is intended as an aid in the diagnosis of influenza from Nasopharyngeal swab specimens and should not be used as a sole basis for treatment. Nasal washings and aspirates are unacceptable for Xpert Xpress  SARS-CoV-2/FLU/RSV testing.  Fact Sheet for Patients: BloggerCourse.com  Fact Sheet for Healthcare Providers:  SeriousBroker.it  This test is not yet approved or cleared by the [place] and has been authorized for detection and/or diagnosis of SARS-CoV-2 by FDA under an Emergency Use Authorization (EUA). This EUA will remain in effect (meaning this test can be used) for the duration of the COVID-19 declaration under Section 564(b)(1) of the Act, 21 U.S.C. section 360bbb-3(b)(1), unless the authorization is terminated or revoked.     Resp Syncytial Virus by PCR NEGATIVE NEGATIVE    Comment: (NOTE) Fact Sheet for Patients: BloggerCourse.com  Fact Sheet for Healthcare Providers: SeriousBroker.it  This test is not yet approved or cleared by the [place] FDA and has been authorized for detection and/or diagnosis of SARS-CoV-2 by FDA under an Emergency Use Authorization (EUA). This EUA will remain in effect (meaning this test can be used) for the duration of the COVID-19 declaration under Section 564(b)(1) of the Act, 21 U.S.C. section 360bbb-3(b)(1), unless the authorization is terminated or revoked.  Performed at [facility], [address], [place], [place] [address]   I-Stat Lactic Acid     Status: None   Collection Time: 05/13/23  8:11 PM  Result Value Ref Range   Lactic Acid, Venous 1.4 0.5 - 1.9 mmol/L  POC occult blood, ED     Status: None   Collection Time: 05/13/23  8:18 PM  Result Value Ref Range   Fecal Occult Bld NEGATIVE NEGATIVE  C Difficile Quick Screen w PCR reflex     Status: None   Collection Time: 05/13/23  8:19 PM   Specimen: Stool  Result Value Ref Range   C Diff antigen NEGATIVE NEGATIVE   C Diff toxin NEGATIVE NEGATIVE   C Diff interpretation No C. difficile detected.     Comment: Performed at [facility], [address], [place], [place] [address]   CT Angio Chest PE W and/or Wo Contrast Result Date: 05/13/2023 CLINICAL DATA:  Fall recent surgery diarrhea EXAM: CT ANGIOGRAPHY CHEST CT ABDOMEN AND PELVIS WITH CONTRAST TECHNIQUE: Multidetector CT imaging of the chest was performed using the standard protocol during bolus administration of intravenous contrast. Multiplanar CT image reconstructions and MIPs were obtained to evaluate the vascular anatomy. Multidetector CT imaging of the abdomen and pelvis was performed using the standard protocol during bolus administration of intravenous contrast. RADIATION DOSE REDUCTION: This exam was performed according to the departmental dose-optimization program which includes automated exposure control, adjustment of the mA and/or kV according to patient size and/or use of iterative reconstruction technique. CONTRAST:  75mL OMNIPAQUE IOHEXOL 350 MG/ML SOLN COMPARISON:  Chest x-ray 05/13/2023, CT 12/08/2022 FINDINGS: CTA CHEST FINDINGS Cardiovascular: Satisfactory opacification of the pulmonary arteries to the segmental level. No evidence of pulmonary embolism. Mild atherosclerosis. No aneurysm. Mild coronary vascular calcification. Normal cardiac size. No significant pericardial effusion Mediastinum/Nodes: Patent trachea. Left thyroid nodule measuring 2.6 cm. No suspicious lymph nodes. Mild diffuse esophageal thickening. Lungs/Pleura: No acute airspace disease, or pneumothorax. Small left greater than right pleural effusion. Mild dependent atelectasis in the left lower lobe. Musculoskeletal: Sternum appears intact. No acute osseous abnormality Review of the MIP images confirms the above findings. CT ABDOMEN and PELVIS FINDINGS Hepatobiliary: Status post cholecystectomy. Minimal periportal edema. Common bile duct measures 9 mm. Mild enhancement the ductal walls on coronal series 11, image 58. Status post choledochal jejunostomy Pancreas: Interval Whipple procedure, placement of pancreatic stent.  Multiple cystic densities at the residual pancreatic tail and body. Diffuse ductal dilatation with a few pancreatic calcifications consistent  with chronic pancreatitis. No acute inflammation. Pancreaticojejunostomy with the stent extending from the jejunum to the mid pancreatic duct. Spleen: Small peripheral hypodensities at the superior, posterior and medial spleen which may relate to small infarcts, not seen previously. Adrenals/Urinary Tract: Adrenal glands are normal. Right renal cysts for which no imaging follow-up is recommended. No hydronephrosis. The bladder is unremarkable Stomach/Bowel: Status post partial gastrectomy with gastro jejunostomy. Marked fluid distension of the stomach. Small bowel distal to the anastomosis is decompressed. Diffuse colon wall thickening with mucosal enhancement. Negative appendix. Vascular/Lymphatic: Moderate aortic atherosclerosis. No aneurysm. No suspicious lymph nodes. Reproductive: Enlarged prostate Other: No free air. Minimal fluid adjacent to the liver and in the right gutter. Right abdominal drainage catheter with tip in the gastrohepatic region. Small fluid collection measuring 2.7 x 1.7 cm near the pancreatico jejunal anastomosis, series 7, image 34. mild generalized stranding in the mesentery and upper abdomen likely due to residual postoperative change. Musculoskeletal: No acute or suspicious osseous abnormality. Review of the MIP images confirms the above findings. IMPRESSION: 1. Negative for acute pulmonary embolus. 2. Small left greater than right pleural effusions with mild dependent atelectasis in the left lower lobe. 3. Interval Whipple procedure with placement of pancreatic stent as above. Marked fluid distension of the stomach with decompressed small bowel distal to the gastrojejunal anastomosis, suggesting a degree of obstruction, may be related to postoperative edema at the anastomosis. 4. Small fluid collection measuring 2.7 x 1.7 cm near the pancreatico  jejunal anastomosis, possible postoperative fluid collection. 5. Diffuse colon wall thickening with mucosal enhancement suspicious for colitis 6. Small peripheral hypodensities at the superior, posterior and medial spleen which may relate to small infarcts, not seen previously. 7. Status post cholecystectomy. Mild enhancement of the common bile duct walls, possibly inflammatory and related to recent choledochal jejunostomy 8. Mild diffuse esophageal thickening, question esophagitis. 9. Left thyroid nodule measuring 2.6 cm. Recommend thyroid US (ref: J Am Coll Radiol. 2015 Feb;12(2): 143-50).This should be performed on a nonemergent basis 10. Aortic atherosclerosis. Aortic Atherosclerosis (ICD10-I70.0). Electronically Signed   By: Jasmine Pang M.D.   On: 05/13/2023 22:13   CT ABDOMEN PELVIS W CONTRAST Result Date: 05/13/2023 CLINICAL DATA:  Fall recent surgery diarrhea EXAM: CT ANGIOGRAPHY CHEST CT ABDOMEN AND PELVIS WITH CONTRAST TECHNIQUE: Multidetector CT imaging of the chest was performed using the standard protocol during bolus administration of intravenous contrast. Multiplanar CT image reconstructions and MIPs were obtained to evaluate the vascular anatomy. Multidetector CT imaging of the abdomen and pelvis was performed using the standard protocol during bolus administration of intravenous contrast. RADIATION DOSE REDUCTION: This exam was performed according to the departmental dose-optimization program which includes automated exposure control, adjustment of the mA and/or kV according to patient size and/or use of iterative reconstruction technique. CONTRAST:  75mL OMNIPAQUE IOHEXOL 350 MG/ML SOLN COMPARISON:  Chest x-ray 05/13/2023, CT 12/08/2022 FINDINGS: CTA CHEST FINDINGS Cardiovascular: Satisfactory opacification of the pulmonary arteries to the segmental level. No evidence of pulmonary embolism. Mild atherosclerosis. No aneurysm. Mild coronary vascular calcification. Normal cardiac size. No  significant pericardial effusion Mediastinum/Nodes: Patent trachea. Left thyroid nodule measuring 2.6 cm. No suspicious lymph nodes. Mild diffuse esophageal thickening. Lungs/Pleura: No acute airspace disease, or pneumothorax. Small left greater than right pleural effusion. Mild dependent atelectasis in the left lower lobe. Musculoskeletal: Sternum appears intact. No acute osseous abnormality Review of the MIP images confirms the above findings. CT ABDOMEN and PELVIS FINDINGS Hepatobiliary: Status post cholecystectomy. Minimal periportal edema. Common bile duct measures  9 mm. Mild enhancement the ductal walls on coronal series 11, image 58. Status post choledochal jejunostomy Pancreas: Interval Whipple procedure, placement of pancreatic stent. Multiple cystic densities at the residual pancreatic tail and body. Diffuse ductal dilatation with a few pancreatic calcifications consistent with chronic pancreatitis. No acute inflammation. Pancreaticojejunostomy with the stent extending from the jejunum to the mid pancreatic duct. Spleen: Small peripheral hypodensities at the superior, posterior and medial spleen which may relate to small infarcts, not seen previously. Adrenals/Urinary Tract: Adrenal glands are normal. Right renal cysts for which no imaging follow-up is recommended. No hydronephrosis. The bladder is unremarkable Stomach/Bowel: Status post partial gastrectomy with gastro jejunostomy. Marked fluid distension of the stomach. Small bowel distal to the anastomosis is decompressed. Diffuse colon wall thickening with mucosal enhancement. Negative appendix. Vascular/Lymphatic: Moderate aortic atherosclerosis. No aneurysm. No suspicious lymph nodes. Reproductive: Enlarged prostate Other: No free air. Minimal fluid adjacent to the liver and in the right gutter. Right abdominal drainage catheter with tip in the gastrohepatic region. Small fluid collection measuring 2.7 x 1.7 cm near the pancreatico jejunal  anastomosis, series 7, image 34. mild generalized stranding in the mesentery and upper abdomen likely due to residual postoperative change. Musculoskeletal: No acute or suspicious osseous abnormality. Review of the MIP images confirms the above findings. IMPRESSION: 1. Negative for acute pulmonary embolus. 2. Small left greater than right pleural effusions with mild dependent atelectasis in the left lower lobe. 3. Interval Whipple procedure with placement of pancreatic stent as above. Marked fluid distension of the stomach with decompressed small bowel distal to the gastrojejunal anastomosis, suggesting a degree of obstruction, may be related to postoperative edema at the anastomosis. 4. Small fluid collection measuring 2.7 x 1.7 cm near the pancreatico jejunal anastomosis, possible postoperative fluid collection. 5. Diffuse colon wall thickening with mucosal enhancement suspicious for colitis 6. Small peripheral hypodensities at the superior, posterior and medial spleen which may relate to small infarcts, not seen previously. 7. Status post cholecystectomy. Mild enhancement of the common bile duct walls, possibly inflammatory and related to recent choledochal jejunostomy 8. Mild diffuse esophageal thickening, question esophagitis. 9. Left thyroid nodule measuring 2.6 cm. Recommend thyroid US (ref: J Am Coll Radiol. 2015 Feb;12(2): 143-50).This should be performed on a nonemergent basis 10. Aortic atherosclerosis. Aortic Atherosclerosis (ICD10-I70.0). Electronically Signed   By: Jasmine Pang M.D.   On: 05/13/2023 22:13   CT HEAD WO CONTRAST ( ) Result Date: 05/13/2023 CLINICAL DATA:  Head trauma, moderate to severe. EXAM: CT HEAD WITHOUT CONTRAST TECHNIQUE: Contiguous axial images were obtained from the base of the skull through the vertex without intravenous contrast. RADIATION DOSE REDUCTION: This exam was performed according to the departmental dose-optimization program which includes automated exposure  control, adjustment of the mA and/or kV according to patient size and/or use of iterative reconstruction technique. COMPARISON:  11/09/2022 FINDINGS: Brain: Mild cerebral atrophy. Mild ventricular dilatation consistent with central atrophy. Patchy low-attenuation changes in the deep white matter likely representing small vessel ischemic change. No significant progression since the previous study. No mass-effect or midline shift. No abnormal extra-axial fluid collections. Gray-white matter junctions are distinct. Basal cisterns are not effaced. No acute intracranial hemorrhage. There is expansion of the sella with sellar mass measuring 1.5 cm diameter. Asymmetric extension towards the right cavernous sinus with bone remodeling and thinning. Appearance is similar to previous study. Is recommended in the prior study, MRI is recommended for further characterization in the nonemergent setting. Vascular: No hyperdense vessel or unexpected calcification. Skull: Normal.  Negative for fracture or focal lesion. Sinuses/Orbits: Mucosal thickening in the paranasal sinuses. No acute air-fluid levels. Mastoid air cells are clear. Other: None. IMPRESSION: 1. No acute intracranial abnormalities. Mild atrophy and small vessel ischemic changes. 2. 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 As previously recommended, nonemergent MRI is suggested for further characterization. Electronically Signed   By: Burman Nieves M.D.   On: 05/13/2023 21:51   CT Cervical Spine Wo Contrast Result Date: 05/13/2023 CLINICAL DATA:  Neck trauma with midline tenderness. EXAM: CT CERVICAL SPINE WITHOUT CONTRAST TECHNIQUE: Multidetector CT imaging of the cervical spine was performed without intravenous contrast. Multiplanar CT image reconstructions were also generated. RADIATION DOSE REDUCTION: This exam was performed according to the departmental dose-optimization program which includes automated exposure control, adjustment of the mA and/or  kV according to patient size and/or use of iterative reconstruction technique. COMPARISON:  CT cervical spine 04/14/2004. MRI cervical spine 06/28/2017. Cervical spine radiographs 07/22/2020 FINDINGS: Alignment: Normal alignment without change since prior studies. Skull base and vertebrae: Skull base appears intact. No vertebral compression deformities. No focal bone lesion or bone destruction. Postoperative changes with anterior plate and screw fixation and intervertebral fusion from C4 through C6. Surgical screws in the posterior elements from C3 through C7. Soft tissues and spinal canal: No prevertebral fluid or swelling. No visible canal hematoma. Disc levels: Intervertebral fusion at C4-5 and C5-6 levels. Degenerative changes in the remainder of the cervical spine with narrowed interspaces and endplate osteophyte formation. Degenerative changes in the facet joints. Upper chest: Lung apices are clear. Left thyroid gland nodule measuring 2.7 cm diameter. Other: None. IMPRESSION: 1. No acute displaced fractures demonstrated in the cervical spine. Normal alignment. 2. Mild degenerative changes. 3. Postoperative changes as described. 4. Incidental left thyroid nodule measuring 2.7 cm. Recommend nonemergent thyroid ultrasound. Reference: J Am Coll Radiol. 2015 Feb;12(2): 143-50 Electronically Signed   By: Burman Nieves M.D.   On: 05/13/2023 21:47   DG Chest Portable 1 View Result Date: 05/13/2023 CLINICAL DATA:  Shortness of breath EXAM: PORTABLE CHEST 1 VIEW COMPARISON:  11/09/2022 FINDINGS: The heart size and mediastinal contours are within normal limits. Both lungs are clear. The visualized skeletal structures are unremarkable. IMPRESSION: No active disease. Electronically Signed   By: Minerva Fester M.D.   On: 05/13/2023 21:01    Review of Systems  Blood pressure (!) 128/54, pulse 90, temperature 98.1 F (36.7 C), temperature source Oral, resp. rate 14, height 5\' 11"  (1.803 m), weight 80.7 kg, SpO2  100%. Physical Exam Constitutional:      Appearance: He is not ill-appearing.  HENT:     Head: Normocephalic.     Mouth/Throat:     Mouth: Mucous membranes are moist.  Eyes:     Pupils: Pupils are equal, round, and reactive to light.  Cardiovascular:     Rate and Rhythm: Normal rate and regular rhythm.     Pulses: Normal pulses.  Pulmonary:     Effort: Pulmonary effort is normal.     Breath sounds: Normal breath sounds.  Abdominal:     General: Abdomen is flat.     Palpations: Abdomen is soft.     Tenderness: There is no abdominal tenderness.     Comments: JP drain has tan output.  His midline is healed well.  Musculoskeletal:        General: No swelling.  Skin:    General: Skin is warm.     Capillary Refill: Capillary refill takes 2 to 3  seconds.  Neurological:     Mental Status: He is alert.      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 Diarrhea -has been significant so will admit for IV fluids.  Will start Cipro and Flagyl.  Clears for now and will advance as able.  Liz Malady, MD 05/14/2023, 2:21 AM

## 2023-05-14 NOTE — Plan of Care (Signed)
   Problem: Education: Goal: Ability to describe self-care measures that may prevent or decrease complications (Diabetes Survival Skills Education) will improve Outcome: Progressing Goal: Individualized Educational Video(s) Outcome: Progressing

## 2023-05-15 LAB — GLUCOSE, CAPILLARY
Glucose-Capillary: 145 mg/dL — ABNORMAL HIGH (ref 70–99)
Glucose-Capillary: 163 mg/dL — ABNORMAL HIGH (ref 70–99)
Glucose-Capillary: 140 mg/dL — ABNORMAL HIGH (ref 70–99)
Glucose-Capillary: 125 mg/dL — ABNORMAL HIGH (ref 70–99)

## 2023-05-15 MED ORDER — DM-GUAIFENESIN ER 30-600 MG PO TB12
1.0000 | ORAL_TABLET | Freq: Four times a day (QID) | ORAL | Status: DC | PRN
  Administered 2023-05-15: 1 via ORAL
  Filled 2023-05-15: qty 1

## 2023-05-15 NOTE — Plan of Care (Signed)
  Problem: Fluid Volume: Goal: Ability to maintain a balanced intake and output will improve Outcome: Progressing   Problem: Metabolic: Goal: Ability to maintain appropriate glucose levels will improve Outcome: Progressing   Problem: Nutritional: Goal: Maintenance of adequate nutrition will improve Outcome: Progressing   Problem: Skin Integrity: Goal: Risk for impaired skin integrity will decrease Outcome: Progressing   Problem: Activity: Goal: Risk for activity intolerance will decrease Outcome: Progressing   Problem: Elimination: Goal: Will not experience complications related to bowel motility Outcome: Progressing Goal: Will not experience complications related to urinary retention Outcome: Progressing   Problem: Pain Managment: Goal: General experience of comfort will improve and/or be controlled Outcome: Progressing

## 2023-05-15 NOTE — Evaluation (Addendum)
Physical Therapy Evaluation Patient Details Name: Andrew Tanner MRN: [id number] DOB: 06/25/1950 Today's Date: 05/15/2023  History of Present Illness  72yo M admitted 2/1 after fall at home and diarrhea.  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 CT scan of the abdomen and pelvis shows diffuse colitis.  He has a small fluid collection near the pancreatico jejunal anastomosis.  PMH: anxiety, DM  Clinical Impression  Pt admitted with above diagnosis. Pt was unable to sit EOB with posterior lean and could not right himself. NT stated that he has been fatigued this pm and not doing as well as he did this am.  Pt and chart reports that pt was struggling at home since surgery therefore post acute rehab < 3 hour day may benefit pt. Pt somewhat confused today and chart states wife reports memory issues noted at home. Pt also got up without assist this am.  Will follow acutely and progress pt as able.  Pt currently with functional limitations due to the deficits listed below (see PT Problem List). Pt will benefit from acute skilled PT to increase their independence and safety with mobility to allow discharge.           If plan is discharge home, recommend the following: Assistance with cooking/housework;Assist for transportation;A little help with walking and/or transfers;A little help with bathing/dressing/bathroom;Help with stairs or ramp for entrance   Can travel by private vehicle   Yes    Equipment Recommendations None recommended by PT  Recommendations for Other Services       Functional Status Assessment Patient has had a recent decline in their functional status and demonstrates the ability to make significant improvements in function in a reasonable and predictable amount of time.     Precautions / Restrictions Precautions Precautions: Fall Precaution Comments: JP drain right side, Enteric precautions, C diff negative Restrictions Weight Bearing  Restrictions Per Provider Order: No      Mobility  Bed Mobility Overal bed mobility: Needs Assistance Bed Mobility: Rolling, Sidelying to Sit, Sit to Sidelying Rolling: Supervision Sidelying to sit: Mod assist, Used rails       General bed mobility comments: Could not attain balance with pt stating "I am weak and cant keep my balance."  Leaning posteriorly and could not right self.    Transfers                   General transfer comment: unable to assess    Ambulation/Gait                  Stairs            Wheelchair Mobility     Tilt Bed    Modified Rankin (Stroke Patients Only)       Balance Overall balance assessment: Needs assistance Sitting-balance support: Bilateral upper extremity supported, Feet supported Sitting balance-Leahy Scale: Poor Sitting balance - Comments: posterior lean and could not right self. Postural control: Posterior lean                                   Pertinent Vitals/Pain Pain Assessment Pain Assessment: No/denies pain    Home Living Family/patient expects to be discharged to:: Private residence Living Arrangements: Spouse/significant other Available Help at Discharge: Family;Available 24 hours/day (wife can assist) Type of Home: House Home Access: Stairs to enter   [employer] of Steps: 4 Alternate  Level Stairs-Number of Steps: flight Home Layout: Two level;Bed/bath upstairs Home Equipment: Shower seat;Rolling Walker (2 wheels) Additional Comments: HHPT was just geteting ready to come to house.    Prior Function Prior Level of Function : Independent/Modified Independent;Working/employed;Driving             Mobility Comments: used RW upstairs once home with Modif I ADLs Comments: B/D self     Extremity/Trunk Assessment   Upper Extremity Assessment Upper Extremity Assessment: Defer to OT evaluation    Lower Extremity Assessment Lower Extremity Assessment:  Generalized weakness    Cervical / Trunk Assessment Cervical / Trunk Assessment: Normal  Communication   Communication Communication: No apparent difficulties  Cognition Arousal: Alert Behavior During Therapy: WFL for tasks assessed/performed Overall Cognitive Status: Impaired/Different from baseline Area of Impairment: Orientation, Attention, Memory, Safety/judgement, Following commands, Awareness, Problem solving                 Orientation Level: Disoriented to, Time, Situation   Memory: Decreased short-term memory, Decreased recall of precautions Following Commands: Follows one step commands inconsistently, Follows one step commands with increased time Safety/Judgement: Decreased awareness of safety   Problem Solving: Slow processing, Decreased initiation, Difficulty sequencing, Requires verbal cues, Requires tactile cues General Comments: Per chart, wife reports confusion and memory issues since surgery        General Comments      Exercises General Exercises - Lower Extremity Ankle Circles/Pumps: AROM, Both, 5 reps, Supine Quad Sets: AROM, Both, 5 reps, Supine Heel Slides: AROM, Both, 5 reps, Supine   Assessment/Plan    PT Assessment Patient needs continued PT services  PT Problem List Decreased activity tolerance;Decreased balance;Decreased mobility;Decreased coordination;Decreased cognition;Pain;Decreased knowledge of use of DME;Decreased strength       PT Treatment Interventions DME instruction;Gait training;Stair training;Functional mobility training;Therapeutic activities;Balance training;Patient/family education;Neuromuscular re-education    PT Goals (Current goals can be found in the Care Plan section)  Acute Rehab PT Goals Patient Stated Goal: 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 PT Goal Formulation: With patient Time For Goal Achievement: 05/29/23 Potential to Achieve Goals: Good    Frequency Min 1X/week     Co-evaluation                AM-PAC PT "6 Clicks" Mobility  Outcome Measure Help needed turning from your back to your side while in a flat bed without using bedrails?: A Little Help needed moving from lying on your back to sitting on the side of a flat bed without using bedrails?: A Lot Help needed moving to and from a bed to a chair (including a wheelchair)?: A Lot Help needed standing up from a chair using your arms (e.g., wheelchair or bedside chair)?: A Lot Help needed to walk in hospital room?: Total Help needed climbing 3-5 steps with a railing? : Total 6 Click Score: 11    End of Session   Activity Tolerance: Patient limited by fatigue Patient left: in bed;with call bell/phone within reach;with bed alarm set Nurse Communication: Mobility status PT Visit Diagnosis: Unsteadiness on feet (R26.81);Other abnormalities of gait and mobility (R26.89);Other (comment)    Time: 7829-5621 PT Time Calculation (min) (ACUTE ONLY): 18 min   Charges:   PT Evaluation $PT Eval Moderate Complexity: 1 Mod   PT General Charges $$ ACUTE PT VISIT: 1 Visit         Lacreasha Hinds M,PT Acute Rehab Services [phone]   Bevelyn Buckles 05/15/2023, 4:32 PM

## 2023-05-15 NOTE — Progress Notes (Signed)
Subjective/Chief Complaint: Tolerating full liquids. Still having some loose stools. Had a few episodes of non compliance regarding getting up without assistance.     Objective: Vital signs in last 24 hours: Temp:  [98 F (36.7 C)-98.5 F (36.9 C)] 98.5 F (36.9 C) (02/03 0801) Pulse Rate:  [79-91] 89 (02/03 0801) Resp:  [16-18] 17 (02/03 0801) BP: (129-155)/(82-90) 143/88 (02/03 0801) SpO2:  [99 %-100 %] 99 % (02/03 0801) Last BM Date : 05/15/23  Intake/Output from previous day: 02/02 0701 - 02/03 0700 In: 3391.7 [P.O.:360; I.V.:2131.7; IV Piggyback:900.1] Out: 155 [Drains:155] Intake/Output this shift: Total I/O In: -  Out: 350 [Urine:200; Drains:150]  General:  looks a bit weak Abdomen:  soft non distended, non tender Drain much less murky  Lab Results:  Recent Labs    05/14/23 0201 05/14/23 1634  WBC 10.7* 11.1*  HGB 10.6* 11.5*  HCT 33.2* 35.4*  PLT 490* 427*   BMET Recent Labs    05/13/23 1945 05/14/23 0201 05/14/23 1634  NA 140  --  138  K 3.7  --  4.1  CL 111  --  110  CO2 16*  --  16*  GLUCOSE 239*  --  155*  BUN 23  --  14  CREATININE 1.14 0.86 0.78  CALCIUM 9.4  --  9.0   PT/INR No results for input(s): "LABPROT", "INR" in the last 72 hours. ABG No results for input(s): "PHART", "HCO3" in the last 72 hours.  Invalid input(s): "PCO2", "PO2"  Studies/Results: CT Angio Chest PE W and/or Wo Contrast Result Date: 05/13/2023 CLINICAL DATA:  Fall recent surgery diarrhea EXAM: CT ANGIOGRAPHY CHEST CT ABDOMEN AND PELVIS WITH CONTRAST TECHNIQUE: Multidetector CT imaging of the chest was performed using the standard protocol during bolus administration of intravenous contrast. Multiplanar CT image reconstructions and MIPs were obtained to evaluate the vascular anatomy. Multidetector CT imaging of the abdomen and pelvis was performed using the standard protocol during bolus administration of intravenous contrast. RADIATION DOSE REDUCTION: This exam  was performed according to the departmental dose-optimization program which includes automated exposure control, adjustment of the mA and/or kV according to patient size and/or use of iterative reconstruction technique. CONTRAST:  75mL OMNIPAQUE IOHEXOL 350 MG/ML SOLN COMPARISON:  Chest x-ray 05/13/2023, CT 12/08/2022 FINDINGS: CTA CHEST FINDINGS Cardiovascular: Satisfactory opacification of the pulmonary arteries to the segmental level. No evidence of pulmonary embolism. Mild atherosclerosis. No aneurysm. Mild coronary vascular calcification. Normal cardiac size. No significant pericardial effusion Mediastinum/Nodes: Patent trachea. Left thyroid nodule measuring 2.6 cm. No suspicious lymph nodes. Mild diffuse esophageal thickening. Lungs/Pleura: No acute airspace disease, or pneumothorax. Small left greater than right pleural effusion. Mild dependent atelectasis in the left lower lobe. Musculoskeletal: Sternum appears intact. No acute osseous abnormality Review of the MIP images confirms the above findings. CT ABDOMEN and PELVIS FINDINGS Hepatobiliary: Status post cholecystectomy. Minimal periportal edema. Common bile duct measures 9 mm. Mild enhancement the ductal walls on coronal series 11, image 58. Status post choledochal jejunostomy Pancreas: Interval Whipple procedure, placement of pancreatic stent. Multiple cystic densities at the residual pancreatic tail and body. Diffuse ductal dilatation with a few pancreatic calcifications consistent with chronic pancreatitis. No acute inflammation. Pancreaticojejunostomy with the stent extending from the jejunum to the mid pancreatic duct. Spleen: Small peripheral hypodensities at the superior, posterior and medial spleen which may relate to small infarcts, not seen previously. Adrenals/Urinary Tract: Adrenal glands are normal. Right renal cysts for which no imaging follow-up is recommended. No hydronephrosis. The bladder  is unremarkable Stomach/Bowel: Status post  partial gastrectomy with gastro jejunostomy. Marked fluid distension of the stomach. Small bowel distal to the anastomosis is decompressed. Diffuse colon wall thickening with mucosal enhancement. Negative appendix. Vascular/Lymphatic: Moderate aortic atherosclerosis. No aneurysm. No suspicious lymph nodes. Reproductive: Enlarged prostate Other: No free air. Minimal fluid adjacent to the liver and in the right gutter. Right abdominal drainage catheter with tip in the gastrohepatic region. Small fluid collection measuring 2.7 x 1.7 cm near the pancreatico jejunal anastomosis, series 7, image 34. mild generalized stranding in the mesentery and upper abdomen likely due to residual postoperative change. Musculoskeletal: No acute or suspicious osseous abnormality. Review of the MIP images confirms the above findings. IMPRESSION: 1. Negative for acute pulmonary embolus. 2. Small left greater than right pleural effusions with mild dependent atelectasis in the left lower lobe. 3. Interval Whipple procedure with placement of pancreatic stent as above. Marked fluid distension of the stomach with decompressed small bowel distal to the gastrojejunal anastomosis, suggesting a degree of obstruction, may be related to postoperative edema at the anastomosis. 4. Small fluid collection measuring 2.7 x 1.7 cm near the pancreatico jejunal anastomosis, possible postoperative fluid collection. 5. Diffuse colon wall thickening with mucosal enhancement suspicious for colitis 6. Small peripheral hypodensities at the superior, posterior and medial spleen which may relate to small infarcts, not seen previously. 7. Status post cholecystectomy. Mild enhancement of the common bile duct walls, possibly inflammatory and related to recent choledochal jejunostomy 8. Mild diffuse esophageal thickening, question esophagitis. 9. Left thyroid nodule measuring 2.6 cm. Recommend thyroid US (ref: J Am Coll Radiol. 2015 Feb;12(2): 143-50).This should be  performed on a nonemergent basis 10. Aortic atherosclerosis. Aortic Atherosclerosis (ICD10-I70.0). Electronically Signed   By: Jasmine Pang M.D.   On: 05/13/2023 22:13   CT ABDOMEN PELVIS W CONTRAST Result Date: 05/13/2023 CLINICAL DATA:  Fall recent surgery diarrhea EXAM: CT ANGIOGRAPHY CHEST CT ABDOMEN AND PELVIS WITH CONTRAST TECHNIQUE: Multidetector CT imaging of the chest was performed using the standard protocol during bolus administration of intravenous contrast. Multiplanar CT image reconstructions and MIPs were obtained to evaluate the vascular anatomy. Multidetector CT imaging of the abdomen and pelvis was performed using the standard protocol during bolus administration of intravenous contrast. RADIATION DOSE REDUCTION: This exam was performed according to the departmental dose-optimization program which includes automated exposure control, adjustment of the mA and/or kV according to patient size and/or use of iterative reconstruction technique. CONTRAST:  75mL OMNIPAQUE IOHEXOL 350 MG/ML SOLN COMPARISON:  Chest x-ray 05/13/2023, CT 12/08/2022 FINDINGS: CTA CHEST FINDINGS Cardiovascular: Satisfactory opacification of the pulmonary arteries to the segmental level. No evidence of pulmonary embolism. Mild atherosclerosis. No aneurysm. Mild coronary vascular calcification. Normal cardiac size. No significant pericardial effusion Mediastinum/Nodes: Patent trachea. Left thyroid nodule measuring 2.6 cm. No suspicious lymph nodes. Mild diffuse esophageal thickening. Lungs/Pleura: No acute airspace disease, or pneumothorax. Small left greater than right pleural effusion. Mild dependent atelectasis in the left lower lobe. Musculoskeletal: Sternum appears intact. No acute osseous abnormality Review of the MIP images confirms the above findings. CT ABDOMEN and PELVIS FINDINGS Hepatobiliary: Status post cholecystectomy. Minimal periportal edema. Common bile duct measures 9 mm. Mild enhancement the ductal walls on  coronal series 11, image 58. Status post choledochal jejunostomy Pancreas: Interval Whipple procedure, placement of pancreatic stent. Multiple cystic densities at the residual pancreatic tail and body. Diffuse ductal dilatation with a few pancreatic calcifications consistent with chronic pancreatitis. No acute inflammation. Pancreaticojejunostomy with the stent extending from the jejunum  to the mid pancreatic duct. Spleen: Small peripheral hypodensities at the superior, posterior and medial spleen which may relate to small infarcts, not seen previously. Adrenals/Urinary Tract: Adrenal glands are normal. Right renal cysts for which no imaging follow-up is recommended. No hydronephrosis. The bladder is unremarkable Stomach/Bowel: Status post partial gastrectomy with gastro jejunostomy. Marked fluid distension of the stomach. Small bowel distal to the anastomosis is decompressed. Diffuse colon wall thickening with mucosal enhancement. Negative appendix. Vascular/Lymphatic: Moderate aortic atherosclerosis. No aneurysm. No suspicious lymph nodes. Reproductive: Enlarged prostate Other: No free air. Minimal fluid adjacent to the liver and in the right gutter. Right abdominal drainage catheter with tip in the gastrohepatic region. Small fluid collection measuring 2.7 x 1.7 cm near the pancreatico jejunal anastomosis, series 7, image 34. mild generalized stranding in the mesentery and upper abdomen likely due to residual postoperative change. Musculoskeletal: No acute or suspicious osseous abnormality. Review of the MIP images confirms the above findings. IMPRESSION: 1. Negative for acute pulmonary embolus. 2. Small left greater than right pleural effusions with mild dependent atelectasis in the left lower lobe. 3. Interval Whipple procedure with placement of pancreatic stent as above. Marked fluid distension of the stomach with decompressed small bowel distal to the gastrojejunal anastomosis, suggesting a degree of  obstruction, may be related to postoperative edema at the anastomosis. 4. Small fluid collection measuring 2.7 x 1.7 cm near the pancreatico jejunal anastomosis, possible postoperative fluid collection. 5. Diffuse colon wall thickening with mucosal enhancement suspicious for colitis 6. Small peripheral hypodensities at the superior, posterior and medial spleen which may relate to small infarcts, not seen previously. 7. Status post cholecystectomy. Mild enhancement of the common bile duct walls, possibly inflammatory and related to recent choledochal jejunostomy 8. Mild diffuse esophageal thickening, question esophagitis. 9. Left thyroid nodule measuring 2.6 cm. Recommend thyroid US (ref: J Am Coll Radiol. 2015 Feb;12(2): 143-50).This should be performed on a nonemergent basis 10. Aortic atherosclerosis. Aortic Atherosclerosis (ICD10-I70.0). Electronically Signed   By: Jasmine Pang M.D.   On: 05/13/2023 22:13   CT HEAD WO CONTRAST ( ) Result Date: 05/13/2023 CLINICAL DATA:  Head trauma, moderate to severe. EXAM: CT HEAD WITHOUT CONTRAST TECHNIQUE: Contiguous axial images were obtained from the base of the skull through the vertex without intravenous contrast. RADIATION DOSE REDUCTION: This exam was performed according to the departmental dose-optimization program which includes automated exposure control, adjustment of the mA and/or kV according to patient size and/or use of iterative reconstruction technique. COMPARISON:  11/09/2022 FINDINGS: Brain: Mild cerebral atrophy. Mild ventricular dilatation consistent with central atrophy. Patchy low-attenuation changes in the deep white matter likely representing small vessel ischemic change. No significant progression since the previous study. No mass-effect or midline shift. No abnormal extra-axial fluid collections. Gray-white matter junctions are distinct. Basal cisterns are not effaced. No acute intracranial hemorrhage. 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 Asymmetric extension towards the right cavernous sinus with bone remodeling and thinning. Appearance is similar to previous study. Is recommended in the prior study, MRI is recommended for further characterization in the nonemergent setting. Vascular: No hyperdense vessel or unexpected calcification. Skull: Normal. Negative for fracture or focal lesion. Sinuses/Orbits: Mucosal thickening in the paranasal sinuses. No acute air-fluid levels. Mastoid air cells are clear. Other: None. IMPRESSION: 1. No acute intracranial abnormalities. Mild atrophy and small vessel ischemic changes. 2. 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 As previously recommended, nonemergent MRI is suggested for further characterization. Electronically  Signed   By: Burman Nieves M.D.   On: 05/13/2023 21:51   CT Cervical Spine Wo Contrast Result Date: 05/13/2023 CLINICAL DATA:  Neck trauma with midline tenderness. EXAM: CT CERVICAL SPINE WITHOUT CONTRAST TECHNIQUE: Multidetector CT imaging of the cervical spine was performed without intravenous contrast. Multiplanar CT image reconstructions were also generated. RADIATION DOSE REDUCTION: This exam was performed according to the departmental dose-optimization program which includes automated exposure control, adjustment of the mA and/or kV according to patient size and/or use of iterative reconstruction technique. COMPARISON:  CT cervical spine 04/14/2004. MRI cervical spine 06/28/2017. Cervical spine radiographs 07/22/2020 FINDINGS: Alignment: Normal alignment without change since prior studies. Skull base and vertebrae: Skull base appears intact. No vertebral compression deformities. No focal bone lesion or bone destruction. Postoperative changes with anterior plate and screw fixation and intervertebral fusion from C4 through C6. Surgical screws in the posterior elements from C3 through C7. Soft tissues and spinal canal: No prevertebral fluid or  swelling. No visible canal hematoma. Disc levels: Intervertebral fusion at C4-5 and C5-6 levels. Degenerative changes in the remainder of the cervical spine with narrowed interspaces and endplate osteophyte formation. Degenerative changes in the facet joints. Upper chest: Lung apices are clear. Left thyroid gland nodule measuring 2.7 cm diameter. Other: None. IMPRESSION: 1. No acute displaced fractures demonstrated in the cervical spine. Normal alignment. 2. Mild degenerative changes. 3. Postoperative changes as described. 4. Incidental left thyroid nodule measuring 2.7 cm. Recommend nonemergent thyroid ultrasound. Reference: J Am Coll Radiol. 2015 Feb;12(2): 143-50 Electronically Signed   By: Burman Nieves M.D.   On: 05/13/2023 21:47   DG Chest Portable 1 View Result Date: 05/13/2023 CLINICAL DATA:  Shortness of breath EXAM: PORTABLE CHEST 1 VIEW COMPARISON:  11/09/2022 FINDINGS: The heart size and mediastinal contours are within normal limits. Both lungs are clear. The visualized skeletal structures are unremarkable. IMPRESSION: No active disease. Electronically Signed   By: Minerva Fester M.D.   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   Readmitted with colitis and fall.  No apparent injury from fall IV antibiotics for colitis Initial c diff negative as well as stool pathogen panel. Respiratory panel negative.   PT/OT consult.  TOC consult.  At prior discharge, patient and wife were at  odds regarding discharge plan.  Pt had home health and nursing in place, but patient wasn't eating well and seemed very weak.    IV fluids for diarrhea/rehydration Insulin for DM  Soft diet. Lovenox for vte ppx. Megace for appetite stimulation and protein calorie malnutrition.        LOS: 2 days    [name] 05/15/2023

## 2023-05-15 NOTE — Progress Notes (Signed)
Pt's bed alarm was activated.  Pt ambulated indep to the BR, +void, +BM. Pt states BM was "loose".  Pt back in bed, educated pt and SO on the importance of using the call bell for assistance and pt made aware that a bowel specimen is needed.  SO states pt has had multiple falls at home PTA and is non compliant with fall precautions.  Bed alarm on, call bell within reach

## 2023-05-16 ENCOUNTER — Inpatient Hospital Stay: Payer: Medicare Other | Admitting: Family Medicine

## 2023-05-16 LAB — GLUCOSE, CAPILLARY
Glucose-Capillary: 161 mg/dL — ABNORMAL HIGH (ref 70–99)
Glucose-Capillary: 146 mg/dL — ABNORMAL HIGH (ref 70–99)
Glucose-Capillary: 145 mg/dL — ABNORMAL HIGH (ref 70–99)
Glucose-Capillary: 154 mg/dL — ABNORMAL HIGH (ref 70–99)
Glucose-Capillary: 151 mg/dL — ABNORMAL HIGH (ref 70–99)

## 2023-05-16 MED ORDER — BOOST PLUS PO LIQD
237.0000 mL | Freq: Three times a day (TID) | ORAL | Status: DC
  Administered 2023-05-16 – 2023-05-18 (×5): 237 mL via ORAL
  Filled 2023-05-16 (×7): qty 237

## 2023-05-16 MED ORDER — PANCRELIPASE (LIP-PROT-AMYL) 36000-114000 UNITS PO CPEP
72000.0000 [IU] | ORAL_CAPSULE | Freq: Three times a day (TID) | ORAL | Status: DC
  Administered 2023-05-16 – 2023-06-01 (×44): 72000 [IU] via ORAL
  Filled 2023-05-16 (×48): qty 2

## 2023-05-16 MED ORDER — PANCRELIPASE (LIP-PROT-AMYL) 36000-114000 UNITS PO CPEP: 36000.0000 [IU] | ORAL_CAPSULE | Freq: Two times a day (BID) | ORAL | Status: DC | PRN

## 2023-05-16 NOTE — Consult Note (Signed)
 [facility] Liaison Consult Note   05/16/2023  Andrew Tanner 02-Apr-1951 [id number]  Insurance: [place] Epps [facility] Medicare  Primary Care Provider: Wendolyn Jenkins Jansky, MD hendricks Finn at [facility], this provider is listed for the transition of care follow up appointments  and [facility] calls   [facility] Liaison met patient at bedside at [facility]. Patient finished with PT.  Patient desires home with home health.  Patient states a little tired.  He states that his wife Andrew Tanner can be contacted by staff if he returns home as he is a bit weak and likely not answering the phone.    The patient was screened for 7 and 30 day readmission hospitalization with noted high risk score for unplanned readmission risk 2 hospital admissions in 6 months.  The patient was assessed for potential Community Care Coordination service needs for post hospital transition for care coordination.  Plan: [facility] Liaison will continue to follow progress and disposition to asess for post hospital community care coordination/management needs.  Referral request for community care coordination: Anticipate outreach by [facility] team for follow up calls.   [facility] Community Care, Population Health does not replace or interfere with any arrangements made by the Inpatient Transition of Care team.   For questions contact:   Richerd Fish, RN, BSN, CCM [place]  [facility], [facility] Liaison Direct Dial : [phone] or secure chat Email: [email or web address]

## 2023-05-16 NOTE — Progress Notes (Signed)
 Calorie Count Note  48 hour calorie count ordered.  Diet: Soft Supplements: n/a  2/3 Dinner: 298 kcal, 20 g protein 2/4 Breakfast: 190 kcal, 17 g protein 2/4 Lunch: 351 kcal, 12 g protein  Estimated Nutritional Needs:  Kcal:  2100-2300 Protein:  105-120 g Fluid:  >/= 2 L  Total intake: 839 kcal (40% of minimum estimated needs)  49 g protein (47% of minimum estimated needs)  INTERVENTION:  Boost Plus po BID, each supplement provides 360 kcal and 14 grams of protein per pt preference  Small protein containing snacks between meals  Continue soft diet per MD recommendations  Request new measured weight   NUTRITION DIAGNOSIS:  Severe Malnutrition (in the context of acute illness/injury) related to poor appetite (malabsorption) as evidenced by moderate fat depletion, severe muscle depletion, moderate muscle depletion, energy intake < or equal to 50% for > or equal to 5 days.   GOAL:  Patient will meet greater than or equal to 90% of their needs  Tinnie Ferraris, MS Dietetic Intern

## 2023-05-16 NOTE — Progress Notes (Signed)
 Subjective/Chief Complaint: Patient definitely a bit weaker than at first discharge.  Still unsteady and having some falls/near misses.  14 steps at home.    Objective: Vital signs in last 24 hours: Temp:  [98 F (36.7 C)-98.6 F (37 C)] 98.5 F (36.9 C) (02/04 0825) Pulse Rate:  [80-100] 87 (02/04 0825) Resp:  [16-19] 16 (02/04 0825) BP: (120-150)/(72-91) 150/88 (02/04 0825) SpO2:  [98 %-99 %] 98 % (02/04 0825) Last BM Date : 05/15/23  Intake/Output from previous day: 02/03 0701 - 02/04 0700 In: 50  Out: 350 [Urine:200; Drains:150] Intake/Output this shift: No intake/output data recorded.  General:  looks a bit weak Abdomen:  soft non distended, non tender Drain much less murky  Lab Results:  Recent Labs    05/14/23 0201 05/14/23 1634  WBC 10.7* 11.1*  HGB 10.6* 11.5*  HCT 33.2* 35.4*  PLT 490* 427*   BMET Recent Labs    05/13/23 1945 05/14/23 0201 05/14/23 1634  NA 140  --  138  K 3.7  --  4.1  CL 111  --  110  CO2 16*  --  16*  GLUCOSE 239*  --  155*  BUN 23  --  14  CREATININE 1.14 0.86 0.78  CALCIUM  9.4  --  9.0   PT/INR No results for input(s): LABPROT, INR in the last 72 hours. ABG No results for input(s): PHART, HCO3 in the last 72 hours.  Invalid input(s): PCO2, PO2  Studies/Results: No results found.   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   Readmitted with colitis and fall.  No apparent injury from fall IV antibiotics for colitis C diff  negative as well as stool pathogen panel. Respiratory panel negative.   PT/OT consult.  TOC consult.  At prior discharge, patient and wife were at odds regarding discharge plan.    Discussed with wife.  Patient was not safe at home as he was not compliant with instructions for mobility.    Lovenox  for vte ppx IV fluids for diarrhea/rehydration, off now.  Rehydrated.  Diarrhea improving. Insulin  for DM  Soft diet. Megace  for appetite stimulation and protein calorie malnutrition. Calorie counts.  Consider rehab consult again.    Send drain for amylase again now that it is clearing up.         LOS: 3 days    Jina Nephew 05/16/2023

## 2023-05-16 NOTE — Progress Notes (Signed)
 Physical Therapy Treatment Patient Details Name: Andrew Tanner MRN: [id number] DOB: 1950/08/12 Today's Date: 05/16/2023   History of Present Illness 73yo M admitted 2/1 after fall at home and diarrhea.  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 Aron was D/C home 05/10/23. CT scan of the abdomen and pelvis shows diffuse colitis.  He has a small fluid collection near the pancreatico jejunal anastomosis.  PMH: anxiety, DM    PT Comments  Pt with improved cognition in comparison to yesterday; A&Ox3, but demonstrates some difficulty with short term memory and carryover/retention of safety cues. Pt requiring min assist for limited hallway ambulation using RW. Displays increased lateral sway and drift, with frequent cueing required for obstacle negotiation. Pt presents as a high fall risk based on recurrent falls, decreased gait speed and decreased safety awareness. Discussed with pt caregiver who reports she has been having a difficult time managing him at home. Based on deficits, recommend continued inpatient follow up therapy, <3 hours/day to maximize functional mobility and decrease caregiver burden.   If plan is discharge home, recommend the following: Assistance with cooking/housework;Assist for transportation;A little help with walking and/or transfers;A little help with bathing/dressing/bathroom;Help with stairs or ramp for entrance   Can travel by private vehicle     Yes  Equipment Recommendations  None recommended by PT    Recommendations for Other Services       Precautions / Restrictions Precautions Precautions: Fall Precaution Comments: JP drain right side Restrictions Weight Bearing Restrictions Per Provider Order: No     Mobility  Bed Mobility Overal bed mobility: Needs Assistance Bed Mobility: Rolling, Sidelying to Sit Rolling: Supervision Sidelying to sit: Supervision            Transfers Overall transfer level: Needs assistance Equipment used:  Rolling walker (2 wheels), None Transfers: Sit to/from Stand, Bed to chair/wheelchair/BSC Sit to Stand: Contact guard assist Stand pivot transfers: Contact guard assist         General transfer comment: CGA for safety for transitioning to standing from edge of bed and pivoting to and from [facility]    Ambulation/Gait Ambulation/Gait assistance: Min assist Gait Distance (Feet): 120 Feet Assistive device: Rolling walker (2 wheels) Gait Pattern/deviations: Step-through pattern, Drifts right/left, Decreased dorsiflexion - left Gait velocity: decraesed Gait velocity interpretation: 1.31 - 2.62 ft/sec, indicative of limited community ambulator   General Gait Details: Pt drifts to the right, cues for larger step lengths and increased foot clearance. Increased lateral sway and drift, requiring minA for stability. frqeuent cueing for obstacle negotiation   Stairs             Wheelchair Mobility     Tilt Bed    Modified Rankin (Stroke Patients Only)       Balance Overall balance assessment: Needs assistance Sitting-balance support: Feet supported Sitting balance-Leahy Scale: Fair     Standing balance support: No upper extremity supported, During functional activity Standing balance-Leahy Scale: Poor                              Cognition Arousal: Alert Behavior During Therapy: WFL for tasks assessed/performed Overall Cognitive Status: Impaired/Different from baseline Area of Impairment: Attention, Memory, Safety/judgement, Following commands, Awareness, Problem solving                     Memory: Decreased short-term memory, Decreased recall of precautions Following Commands: Follows one step commands inconsistently, Follows one step commands with  increased time Safety/Judgement: Decreased awareness of safety   Problem Solving: Slow processing, Decreased initiation, Difficulty sequencing, Requires verbal cues, Requires tactile cues General Comments:  Pt A&O x 4, decreased carryover of instructions and retaining new material        Exercises      General Comments        Pertinent Vitals/Pain Pain Assessment Pain Assessment: No/denies pain    Home Living                          Prior Function            PT Goals (current goals can now be found in the care plan section) Acute Rehab PT Goals Patient Stated Goal: 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 PT Goal Formulation: With patient Time For Goal Achievement: 05/29/23 Potential to Achieve Goals: Good Progress towards PT goals: Progressing toward goals    Frequency    Min 1X/week      PT Plan      Co-evaluation              AM-PAC PT 6 Clicks Mobility   Outcome Measure  Help needed turning from your back to your side while in a flat bed without using bedrails?: A Little Help needed moving from lying on your back to sitting on the side of a flat bed without using bedrails?: A Little Help needed moving to and from a bed to a chair (including a wheelchair)?: A Little Help needed standing up from a chair using your arms (e.g., wheelchair or bedside chair)?: A Little Help needed to walk in hospital room?: A Little Help needed climbing 3-5 steps with a railing? : A Lot 6 Click Score: 17    End of Session Equipment Utilized During Treatment: Gait belt Activity Tolerance: Patient tolerated treatment well Patient left: in chair;with chair alarm set;with family/visitor present Nurse Communication: Mobility status PT Visit Diagnosis: Unsteadiness on feet (R26.81);Other abnormalities of gait and mobility (R26.89);Other (comment)     Time: 1140-1155 PT Time Calculation (min) (ACUTE ONLY): 15 min  Charges:    $Gait Training: 8-22 mins PT General Charges $$ ACUTE PT VISIT: 1 Visit                     Aleck Daring, PT, DPT Acute Rehabilitation Services Office [phone]    Alayne ONEIDA Daring 05/16/2023, 12:56 PM

## 2023-05-16 NOTE — Evaluation (Addendum)
 Occupational Therapy Evaluation Patient Details Name: Andrew Tanner MRN: [id number] DOB: March 13, 1951 Today's Date: 05/16/2023   History of Present Illness 73yo M admitted 2/1 after fall at home and diarrhea.  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 Aron was D/C home 05/10/23. CT scan of the abdomen and pelvis shows diffuse colitis.  He has a small fluid collection near the pancreatico jejunal anastomosis.  PMH: anxiety, DM   Clinical Impression   Patient admitted for above and presents with problem list below.  Pt reports able to manage Adls since surgery, but spouse reports he has been needing assist for bathing and dressing.  Also reports managing meds prior to surgery, but spouse assisting since. Today, pt requires up to min guard for Adls, min guard for transfers and min assist for functional mobility using RW.  He is oriented and follows simple commands, demonstrates some decreased recall and problem solving, slowed processing.  Spouse reports difficulty managing at home and they are interested in <3hrs/day inpatient setting to optimize safety, independence and decrease burden of care.  Will follow acutely.       If plan is discharge home, recommend the following: A little help with walking and/or transfers;A little help with bathing/dressing/bathroom;Assistance with cooking/housework;Direct supervision/assist for medications management;Direct supervision/assist for financial management;Assist for transportation;Help with stairs or ramp for entrance;Supervision due to cognitive status    Functional Status Assessment  Patient has had a recent decline in their functional status and demonstrates the ability to make significant improvements in function in a reasonable and predictable amount of time.  Equipment Recommendations  Other (comment) (defer)    Recommendations for Other Services       Precautions / Restrictions Precautions Precautions: Fall Precaution Comments: JP  drain right side Restrictions Weight Bearing Restrictions Per Provider Order: No      Mobility Bed Mobility               General bed mobility comments: OOB with PT    Transfers Overall transfer level: Needs assistance Equipment used: Rolling walker (2 wheels), None Transfers: Sit to/from Stand, Bed to chair/wheelchair/BSC Sit to Stand: Contact guard assist Stand pivot transfers: Contact guard assist         General transfer comment: CGA for safety for transitioning to standing from edge of bed and pivoting to and from [facility]      Balance Overall balance assessment: Needs assistance Sitting-balance support: Feet supported Sitting balance-Leahy Scale: Fair     Standing balance support: No upper extremity supported, During functional activity Standing balance-Leahy Scale: Poor                             ADL either performed or assessed with clinical judgement   ADL Overall ADL's : Needs assistance/impaired     Grooming: Contact guard assist;Standing;Oral care           Upper Body Dressing : Sitting;Set up   Lower Body Dressing: Contact guard assist;Sit to/from stand Lower Body Dressing Details (indicate cue type and reason): able to complete figure 4 to manage socks, min guard in standing Toilet Transfer: Contact guard assist;Ambulation;Rolling walker (2 wheels)   Toileting- Clothing Manipulation and Hygiene: Contact guard assist;Sit to/from stand       Functional mobility during ADLs: Contact guard assist;Rolling walker (2 wheels) General ADL Comments: cueing for hand placement and carryover of techniques     Vision   Vision Assessment?: No apparent visual deficits  Perception         Praxis         Pertinent Vitals/Pain Pain Assessment Pain Assessment: No/denies pain     Extremity/Trunk Assessment Upper Extremity Assessment Upper Extremity Assessment: Generalized weakness   Lower Extremity Assessment Lower Extremity  Assessment: Defer to PT evaluation   Cervical / Trunk Assessment Cervical / Trunk Assessment: Normal   Communication Communication Communication: No apparent difficulties Cueing Techniques: Verbal cues   Cognition Arousal: Alert Behavior During Therapy: WFL for tasks assessed/performed Overall Cognitive Status: Impaired/Different from baseline Area of Impairment: Attention, Memory, Safety/judgement, Following commands, Awareness, Problem solving                   Current Attention Level: Selective Memory: Decreased recall of precautions, Decreased short-term memory Following Commands: Follows one step commands consistently, Follows one step commands with increased time Safety/Judgement: Decreased awareness of safety, Decreased awareness of deficits Awareness: Emergent Problem Solving: Slow processing, Decreased initiation, Difficulty sequencing, Requires verbal cues, Requires tactile cues General Comments: Pt A&O x 4, decreased carryover of instructions and retaining new material. Slow processing.     General Comments  spouse at side and supportive    Exercises     Shoulder Instructions      Home Living Family/patient expects to be discharged to:: Private residence Living Arrangements: Spouse/significant other Available Help at Discharge: Family;Available 24 hours/day Type of Home: House Home Access: Stairs to enter [employer] of Steps: 4   Home Layout: Two level;Bed/bath upstairs Alternate Level Stairs-Number of Steps: flight Alternate Level Stairs-Rails: Right;Left Bathroom Shower/Tub: Walk-in shower;Tub only   Bathroom Toilet: Handicapped height Bathroom Accessibility: Yes   Home Equipment: Pharmacist, Hospital (2 wheels)          Prior Functioning/Environment Prior Level of Function : Independent/Modified Independent;Working/employed;Driving             Mobility Comments: used RW upstairs once home with Modif I ADLs Comments:  B/D self prior to surgery, but spouse was having to assist since surgery        OT Problem List: Decreased strength;Decreased activity tolerance;Impaired balance (sitting and/or standing);Decreased cognition;Decreased safety awareness;Decreased knowledge of use of DME or AE;Decreased knowledge of precautions      OT Treatment/Interventions: Self-care/ADL training;Therapeutic exercise;Energy conservation;DME and/or AE instruction;Manual therapy;Therapeutic activities;Cognitive remediation/compensation;Balance training;Patient/family education    OT Goals(Current goals can be found in the care plan section) Acute Rehab OT Goals Patient Stated Goal: get better OT Goal Formulation: With patient Time For Goal Achievement: 05/29/23 Potential to Achieve Goals: Good  OT Frequency: Min 1X/week    Co-evaluation              AM-PAC OT 6 Clicks Daily Activity     Outcome Measure Help from another person eating meals?: A Little Help from another person taking care of personal grooming?: A Little Help from another person toileting, which includes using toliet, bedpan, or urinal?: A Little Help from another person bathing (including washing, rinsing, drying)?: A Little Help from another person to put on and taking off regular upper body clothing?: A Little Help from another person to put on and taking off regular lower body clothing?: A Little 6 Click Score: 18   End of Session Equipment Utilized During Treatment: Gait belt;Rolling walker (2 wheels) Nurse Communication: Mobility status;Precautions  Activity Tolerance: Patient tolerated treatment well Patient left: in chair;with call bell/phone within reach;with chair alarm set  OT Visit Diagnosis: Unsteadiness on feet (R26.81);Muscle weakness (generalized) (M62.81);Other symptoms and signs  involving cognitive function                Time: 8844-8791 OT Time Calculation (min): 13 min Charges:  OT General Charges $OT Visit: 1 Visit OT  Evaluation $OT Eval Moderate Complexity: 1 Mod  Etta NOVAK, OT Acute Rehabilitation Services Office [phone]   Etta GORMAN Hope 05/16/2023, 1:24 PM

## 2023-05-16 NOTE — TOC Initial Note (Signed)
 Transition of Care [facility]) - Initial/Assessment Note    Patient Details  Name: Andrew Tanner MRN: [id number] Date of Birth: 05-21-1950  Transition of Care [facility]) CM/SW Contact:    Andrew Cordella Simmonds, LCSW Phone Number: 05/16/2023, 1:24 PM  Clinical Narrative:   CSW met with pt regarding PT recommendation for SNF.  Pt confirms that he declined CIR after recent admission and wanted to DC home with [facility].  He did meet with HH twice ([facility]) prior to readmission and it went well.  Pt does want to plan for DC home again and would like to continue services with [facility].  Permission given to speak with wife Andrew Tanner.   RNCM informed.                  Expected Discharge Plan: Home w Home Health Services Barriers to Discharge: Continued Medical Work up   Patient Goals and CMS Choice Patient states their goals for this hospitalization and ongoing recovery are:: resume his current activities     [place] ownership interest in [facility].provided to:: Patient (wants to continue with [facility])    Expected Discharge Plan and Services In-house Referral: Clinical Social Work   Post Acute Care Choice: Home Health Living arrangements for the past 2 months: Single Family Home                                      Prior Living Arrangements/Services Living arrangements for the past 2 months: Single Family Home Lives with:: Spouse Patient language and need for interpreter reviewed:: Yes Do you feel safe going back to the place where you live?: Yes      Need for Family Participation in Patient Care: Yes (Comment) Care giver support system in place?: Yes (comment) Current home services: Home OT, Home PT ([facility]) Criminal Activity/Legal Involvement Pertinent to Current Situation/Hospitalization: No - Comment as needed  Activities of Daily Living   ADL Screening (condition at time of admission) Independently performs ADLs?: No Does the patient have a NEW  difficulty with bathing/dressing/toileting/self-feeding that is expected to last >3 days?: No Does the patient have a NEW difficulty with getting in/out of bed, walking, or climbing stairs that is expected to last >3 days?: No Does the patient have a NEW difficulty with communication that is expected to last >3 days?: No Is the patient deaf or have difficulty hearing?: No Does the patient have difficulty seeing, even when wearing glasses/contacts?: No Does the patient have difficulty concentrating, remembering, or making decisions?: No  Permission Sought/Granted Permission sought to share information with : Family Supports Permission granted to share information with : Yes, Verbal Permission Granted  Share Information with NAME: wife Andrew Tanner           Emotional Assessment Appearance:: Appears stated age Attitude/Demeanor/Rapport: Engaged Affect (typically observed): Appropriate, Pleasant Orientation: : Oriented to Self, Oriented to Place, Oriented to  Time, Oriented to Situation      Admission diagnosis:  Dehydration [E86.0] Thyroid  nodule [E04.1] Colitis [K52.9] Weakness [R53.1] Pleural effusion [J90] 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] Vomiting and diarrhea [R11.10, R19.7] Diarrhea, unspecified type [R19.7] Intestinal obstruction, unspecified cause, unspecified whether partial or complete [facility]) [K56.609] Patient Active Problem List   Diagnosis Date Noted   Vomiting and diarrhea 05/13/2023   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   Malnutrition of moderate degree 05/08/2023   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   Duodenal adenoma  04/27/2023   Poorly controlled type 2 diabetes mellitus with circulatory disorder (HCC) 02/08/2023   Vitamin D  deficiency 07/25/2022   Pain in both feet 02/10/2022   Peripheral neuropathy 01/25/2022   Seborrheic dermatitis 01/25/2022   Positive colorectal cancer screening using Cologuard test 03/16/2020   Chronic RLQ pain  03/16/2020   Type 2 diabetes mellitus with hyperglycemia, without long-term current use of insulin  (HCC) 06/02/2011   Hypertension 06/02/2011   Hyperlipemia 06/02/2011   Cataract 06/02/2011   PCP:  Wendolyn Jenkins Jansky, MD Pharmacy:   [facility] [address] [place] - [address] [place] [address] Phone: [phone] Fax: [phone]  [facility] [address] [place] [address] Phone: [phone] Fax: [phone]  [facility] [address] [place] [address] Phone: [phone] Fax: [phone]     Social Drivers of Health (SDOH) Social History: SDOH Screenings   Food Insecurity: No Food Insecurity (05/14/2023)  Housing: Low Risk  (05/14/2023)  Transportation Needs: No Transportation Needs (05/14/2023)  Utilities: Not At Risk (05/14/2023)  Alcohol Screen: Low Risk  (10/19/2020)  Depression (PHQ2-9): Low Risk  (03/20/2023)  Financial Resource Strain: Low Risk  (03/16/2023)  Physical Activity: Sufficiently Active (03/16/2023)  Social Connections: Socially Integrated (05/14/2023)  Stress: No Stress Concern Present (03/16/2023)  Tobacco Use: Medium Risk (05/13/2023)  Health Literacy: Adequate Health Literacy (01/03/2023)   SDOH Interventions:     Readmission Risk Interventions    05/15/2023    8:24 AM  Readmission Risk Prevention Plan  Transportation Screening Complete  PCP or Specialist Appt within 3-5 Days Complete  HRI or Home Care Consult Complete

## 2023-05-16 NOTE — Progress Notes (Signed)
 Initial Nutrition Assessment  DOCUMENTATION CODES:   Severe malnutrition in context of acute illness/injury  INTERVENTION:  Boost Plus po BID, each supplement provides 360 kcal and 14 grams of protein per pt preference  Small protein containing snacks between meals  Continue soft diet per MD recommendations  Request new measured weight  NUTRITION DIAGNOSIS:   Severe Malnutrition (in the context of acute illness/injury) related to poor appetite (malabsorption) as evidenced by moderate fat depletion, severe muscle depletion, moderate muscle depletion, energy intake < or equal to 50% for > or equal to 5 days.  GOAL:   Patient will meet greater than or equal to 90% of their needs  MONITOR:   PO intake, Supplement acceptance, Weight trends, Labs  REASON FOR ASSESSMENT:   Consult Calorie Count  ASSESSMENT:   Pt admitted after fall at home and diarrhea and found to have diffuse colitis and small fluid collection near the pancreaticojejunal anastomosis. 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 Pt with PMH of DM, HLD, HTN   1/16: s/p whipple for duodenal adenoma 1/29: Discharged  2/1: Readmitted for fall and consistent diarrhea since discharge from hospital  Pt's wife at bedside during visit and helped to give nutritional/medical hx. Pt remembers speaking with me during his last admission and reports he has been having consistent and uncontrollable diarrhea since leaving. Pt reports having weakness and decreased appetite from the diarrhea and his stomach feeling bloated. Wife reports he was so weak he was unable to boost himself off the bed at home. Pt describes his diarrhea as greasy and appearing to have fat in it. Pt's wife reports he has been eating a lot of yogurt and Gatorade at home but that he has been eating approximately 800 calories a day. Pt's wife keeps a daily log at home of all the food he eats so she can see how many calories he is consuming. Pt's wife and pt  acknowledge he needs more nutrition if he wants to get better. Pt reports his appetite has gotten better today and ate a couple pieces of bacon and greek yogurt for breakfast. Pt reports having a chicken with gravy, white rice, and beans for dinner last night that he tolerated well. Pt reports he has been taking the Creon  before eating meals at home. Pt expressed concern over his muscle losses and reports he wants to prevent any further muscle losses. Pt is eager to adhere to any nutritional recommendations we have for him. Pt reports the snacks and protein drinks he had during his last admission helped his po intake and would like to continue these interventions during this admission. Pt reports he would like to try the Boost since he had so many Ensure's last time. RD will add snacks and Boost Plus TID. Discussed pt adding in higher calorie yogurts at home to promote adequate energy intake.  Upon chart review, noticed Creon  dosage here is half of dosage he was taking at home. Per further research on Creon  website, his at home regimen is lower than is recommended for his weight. Spoke to MD about increasing dosage and MD reports she will increase dosage for meals and add in a dose for his snack between meals.   During his last admission we diagnosed him with moderate malnutrition in the context of chronic illness from DM2. Pt's exam today revealed worsening muscle depletions along with </= 50% of estimated energy requirement for 5 days related to acute illness (whipple surgery and diarrhea).  Admission weight: 80.7  kg Pt's weights have remained stable over the past 5 months and have been between 79.5-81.2 kg.  Meds: Novolog  0-5 units at bedtime, Creon  12,000 units TID, Compazine  10 mg PRN, Megace  400 mg/10 ml, Cipro  IV, Flagyl  IV, Protonix  40 mg daily   Labs: Glucose 140-163 (since admission), A1C 6.8 (04/21/23)  NUTRITION - FOCUSED PHYSICAL EXAM:  Flowsheet Row Most Recent Value  Orbital Region No  depletion  Upper Arm Region Severe depletion  Thoracic and Lumbar Region Moderate depletion  Buccal Region Moderate depletion  Temple Region Mild depletion  Clavicle Bone Region Moderate depletion  Clavicle and Acromion Bone Region Mild depletion  Scapular Bone Region Moderate depletion  Dorsal Hand Moderate depletion  Patellar Region Severe depletion  Anterior Thigh Region Severe depletion  Posterior Calf Region Severe depletion  Edema (RD Assessment) None  Hair Reviewed  Eyes Reviewed  Mouth Reviewed  Skin Reviewed  Nails Reviewed       Diet Order:   Diet Order             DIET SOFT Room service appropriate? Yes; Fluid consistency: Thin  Diet effective now                   EDUCATION NEEDS:   Education needs have been addressed  Skin:  Skin Assessment: Reviewed RN Assessment  Last BM:  2/3- type 7  Height:   Ht Readings from Last 1 Encounters:  05/13/23 5' 11 (1.803 m)    Weight:   Wt Readings from Last 1 Encounters:  05/13/23 80.7 kg    Ideal Body Weight:  78.2 kg  BMI:  Body mass index is 24.83 kg/m.  Estimated Nutritional Needs:   Kcal:  2100-2300  Protein:  105-120 g  Fluid:  >/= 2 L    Tinnie Ferraris, MS Dietetic Intern

## 2023-05-17 DIAGNOSIS — E43 Unspecified severe protein-calorie malnutrition: Secondary | ICD-10-CM | POA: Insufficient documentation

## 2023-05-17 LAB — AMYLASE, PLEURAL OR PERITONEAL FLUID: Amylase, Fluid: >2000 U/L

## 2023-05-17 LAB — GLUCOSE, CAPILLARY
Glucose-Capillary: 193 mg/dL — ABNORMAL HIGH (ref 70–99)
Glucose-Capillary: 170 mg/dL — ABNORMAL HIGH (ref 70–99)
Glucose-Capillary: 180 mg/dL — ABNORMAL HIGH (ref 70–99)
Glucose-Capillary: 160 mg/dL — ABNORMAL HIGH (ref 70–99)

## 2023-05-17 MED ORDER — CIPROFLOXACIN HCL 500 MG PO TABS
500.0000 mg | ORAL_TABLET | Freq: Two times a day (BID) | ORAL | Status: AC
  Administered 2023-05-17 – 2023-05-20 (×7): 500 mg via ORAL
  Filled 2023-05-17 (×7): qty 1

## 2023-05-17 MED ORDER — METRONIDAZOLE 500 MG PO TABS
500.0000 mg | ORAL_TABLET | Freq: Two times a day (BID) | ORAL | Status: AC
  Administered 2023-05-17 – 2023-05-20 (×7): 500 mg via ORAL
  Filled 2023-05-17 (×7): qty 1

## 2023-05-17 NOTE — TOC Progression Note (Addendum)
 Transition of Care [facility]) - Progression Note    Patient Details  Name: Andrew Tanner MRN: [id number] Date of Birth: [birth year]  [facility] CM/SW Contact  Bridget Cordella Simmonds, LCSW Phone Number: 05/17/2023, 10:15 AM  Clinical Narrative:   CSW informed by RN that MD and wife both want DC plan to be SNF.  CSW attempted to call wife, left message.  CSW spoke with pt and discussed this, pt states he is unaware of this and agreed to talk to wife, after I eat my breakfast.  Permission not given to start referral.  CSW then received call from wife and she is going to be at the hospital in about an hour, was hoping MD would be present for this discussion.   1200: CSW, pt and wife met in room.  Wife discussed with pt that she cannot care for him currently and needs him to agree to SNF.  Pt now agreeable and permission given to send out referral in hub.  Wife reports preference for [facility], [facility], [facility], or [facility].  CSW reached out to those facilities to review.   Expected Discharge Plan: Home w Home Health Services Barriers to Discharge: Continued Medical Work up  Expected Discharge Plan and Services In-house Referral: Clinical Social Work   Post Acute Care Choice: Home Health Living arrangements for the past 2 months: Single Family Home                                       Social Determinants of Health (SDOH) Interventions SDOH Screenings   Food Insecurity: No Food Insecurity (05/14/2023)  Housing: Low Risk  (05/14/2023)  Transportation Needs: No Transportation Needs (05/14/2023)  Utilities: Not At Risk (05/14/2023)  Alcohol Screen: Low Risk  (10/19/2020)  Depression (PHQ2-9): Low Risk  (03/20/2023)  Financial Resource Strain: Low Risk  (03/16/2023)  Physical Activity: Sufficiently Active (03/16/2023)  Social Connections: Socially Integrated (05/14/2023)  Stress: No Stress Concern Present (03/16/2023)  Tobacco Use: Medium Risk (05/13/2023)  Health  Literacy: Adequate Health Literacy (01/03/2023)    Readmission Risk Interventions    05/15/2023    8:24 AM  Readmission Risk Prevention Plan  Transportation Screening Complete  PCP or Specialist Appt within 3-5 Days Complete  HRI or Home Care Consult Complete

## 2023-05-17 NOTE — Progress Notes (Addendum)
 RE:  Andrew Tanner      Date of Birth:  Feb 25, 2051     Date:   05/17/23       To Whom It May Concern:  Please be advised that the above-named patient will require a short-term nursing home stay - anticipated 30 days or less for rehabilitation and strengthening.  The plan is for return home.                 MD signature                Date

## 2023-05-17 NOTE — Progress Notes (Signed)
 Subjective/Chief Complaint: 40-50 % of caloric need documented yesterday.  Decreased stools. C diff negative and precautions stopped.    Objective: Vital signs in last 24 hours: Temp:  [98 F (36.7 C)-98.2 F (36.8 C)] 98.2 F (36.8 C) (02/05 0800) Pulse Rate:  [82-85] 82 (02/05 0800) Resp:  [14-16] 14 (02/05 0800) BP: (144-149)/(82-85) 146/85 (02/05 0800) SpO2:  [99 %] 99 % (02/05 0800) Last BM Date : 05/17/23  Intake/Output from previous day: 02/04 0701 - 02/05 0700 In: 50  Out: 80 [Drains:80] Intake/Output this shift: Total I/O In: -  Out: 50 [Drains:50]  General:  looks a bit weak Abdomen:  soft non distended, non tender Drain much less murky  Lab Results:  Recent Labs    05/14/23 1634  WBC 11.1*  HGB 11.5*  HCT 35.4*  PLT 427*   BMET Recent Labs    05/14/23 1634  NA 138  K 4.1  CL 110  CO2 16*  GLUCOSE 155*  BUN 14  CREATININE 0.78  CALCIUM  9.0   PT/INR No results for input(s): LABPROT, INR in the last 72 hours. ABG No results for input(s): PHART, HCO3 in the last 72 hours.  Invalid input(s): PCO2, PO2  Studies/Results: No results found.   Anti-infectives: Anti-infectives (From admission, onward)    Start     Dose/Rate Route Frequency Ordered Stop   05/17/23 2000  ciprofloxacin  (CIPRO ) tablet 500 mg        500 mg Oral 2 times daily 05/17/23 1128 05/21/23 0759   05/17/23 2000  metroNIDAZOLE  (FLAGYL ) tablet 500 mg        500 mg Oral Every 12 hours 05/17/23 1128 05/21/23 0759   05/14/23 0245  ciprofloxacin  (CIPRO ) IVPB 400 mg  Status:  Discontinued        400 mg 200 mL/hr over 60 Minutes Intravenous Every 12 hours 05/14/23 0227 05/17/23 1128   05/14/23 0245  metroNIDAZOLE  (FLAGYL ) IVPB 500 mg  Status:  Discontinued        500 mg 100 mL/hr over 60 Minutes Intravenous Every 12 hours 05/14/23 0227 05/17/23 1128       Assessment/Plan: 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   Readmitted with colitis and fall.  No apparent injury from fall IV antibiotics for colitis, switched to oral today.  Plan 7 d total, d/w pharmacy.  C diff negative as well as stool pathogen panel. Respiratory panel negative.   PT/OT consult.  TOC consult.  At prior discharge, patient and wife were at odds regarding discharge plan.    Discussed with wife.  Patient was not safe at home as he was not compliant with instructions for mobility.  Appreciate TOC/SW/Therapies working to make d/c plan.  ? Repeat CIR consult.  Rehab would prob be more appropriate given that he is closer to independence than general SNF discharge.  Biggest barrier is that wife has health challenges and cannot be a significant source of physical help with mobility.  She is fine with other aspects, but not physically able to help him move much.    Lovenox  for vte ppx IV fluids for diarrhea/rehydration, off now.  Rehydrated.  Diarrhea improving. Insulin  for DM  Soft diet. Megace  for appetite stimulation and protein calorie malnutrition. Calorie counts.  Drain amylase pending.        LOS: 4 days    Jina Nephew 05/17/2023

## 2023-05-17 NOTE — Progress Notes (Addendum)
 Nutrition Follow-up  DOCUMENTATION CODES:   Severe malnutrition in context of acute illness/injury  INTERVENTION:  Continue calorie count for an additional 24 hours Continue Boost Plus po BID, each supplement provides 360 kcal and 14 grams of protein per pt preference  Continue Small protein containing snacks between meals  Continue soft diet per MD recommendations  Request new measured weight  NUTRITION DIAGNOSIS:   Severe Malnutrition (in the context of acute illness/injury) related to poor appetite (malabsorption) as evidenced by moderate fat depletion, severe muscle depletion, moderate muscle depletion, energy intake < or equal to 50% for > or equal to 5 days.  -Ongoing  GOAL:   Patient will meet greater than or equal to 90% of their needs  -Progressing with increased PO intake and nutrition supplements  MONITOR:   PO intake, Supplement acceptance, Weight trends, Labs  REASON FOR ASSESSMENT:   Consult Calorie Count  ASSESSMENT:   Pt admitted after fall at home and diarrhea and found to have diffuse colitis and small fluid collection near the pancreaticojejunal anastomosis. 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 Pt with PMH of DM, HLD, HTN  No meal tickets in calorie count envelope to retrieve for yesterdays meal/snack intake. Plan to continue for one more day to attempt to gather objective data to assess nutritional adequacy.   Pt was trying to sleep upon visit and appeared tired. Pt had empty Boost Plus on bedside table and reports he drank it after breakfast this AM. Pt reports he has not received his snacks yet but discussed that he can request them from his nurse today as needed. Pt reports he did not speak to the outpatient RD yet between admissions since he was home for only about 3 days before coming back to the ED. He reports that he plans to contact them once he is discharged and is excited to talk to them so they can further help his nutrition needs. He  reports he had a bowel movement today that was diarrhea but thinks is improving compared to the past weeks bowel movements. He reports feeling a little better today and is optimistic about progressing.   Spoke with MD yesterday about increasing pt's Creon  dosage. Per chart review dosage has been increased  Meds: Boost plus TID, Novolog  0-5 units at bedtime, Creon  72,000 units TID with meals, Creon  36,000 units BID with snacks, Compazine  10 mg PRN, Megace  400 mg/10 ml, Cipro  IV, Flagyl  IV, Protonix  40 mg daily   Labs: CBG 146-193 (last 24 hrs), A1C 6.8 (04/21/23)   Diet Order:   Diet Order             DIET SOFT Room service appropriate? Yes; Fluid consistency: Thin  Diet effective now                   EDUCATION NEEDS:   Education needs have been addressed  Skin:  Skin Assessment: Reviewed RN Assessment  Last BM:  2/5- type 7  Height:   Ht Readings from Last 1 Encounters:  05/13/23 5' 11 (1.803 m)    Weight:   Wt Readings from Last 1 Encounters:  05/13/23 80.7 kg    Ideal Body Weight:  78.2 kg  BMI:  Body mass index is 24.83 kg/m.  Estimated Nutritional Needs:   Kcal:  2100-2300  Protein:  105-120 g  Fluid:  >/= 2 L    Tinnie Ferraris, MS Dietetic Intern

## 2023-05-17 NOTE — Progress Notes (Signed)
 Mobility Specialist Progress Note:    05/17/23 1100  Mobility  Activity Ambulated with assistance in hallway  Level of Assistance Minimal assist, patient does 75% or more  Assistive Device Front wheel walker  Distance Ambulated (ft) 130 ft  Activity Response Tolerated well  Mobility Referral Yes  Mobility visit 1 Mobility  Mobility Specialist Start Time (ACUTE ONLY) 1128  Mobility Specialist Stop Time (ACUTE ONLY) 1142  Mobility Specialist Time Calculation (min) (ACUTE ONLY) 14 min   Pt received in bed and agreeable. Required minA to come EOB and during ambulation. Presented w/ L lateral lean when ambulating w/ RW. No complaints throughout. Once getting back to room, pt's knees seemed to start buckling even though pt denied this. Returned to room w/o fault and left in chair with call bell and chair alarm on.  D'Vante Nicholaus Mobility Specialist Please contact via Special Educational Needs Teacher or Rehab office at [phone]

## 2023-05-17 NOTE — [place] FL2 (Addendum)
 [place]  MEDICAID [id number] LEVEL OF CARE FORM     IDENTIFICATION  Patient Name: Andrew Tanner Birthdate: 09/18/1950 Sex: male Admission Date (Current Location): 05/13/2023  [facility] [place] and [place] Number:  Producer, Television/film/video and Address:  [facility], [address] [place], [place] [address]      Provider Number: [id number]  Attending Physician Name and Address:  Aron Shoulders, MD  Relative Name and Phone Number:  Len [name] Johann   [phone]    Current Level of Care: Hospital Recommended Level of Care: Skilled Nursing Facility Prior Approval Number:    Date Approved/Denied:   PASRR Number:    Discharge Plan: SNF    Current Diagnoses: Patient Active Problem List   Diagnosis Date Noted   Protein-calorie malnutrition, severe 05/17/2023   Vomiting and diarrhea 05/13/2023   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   Malnutrition of moderate degree 05/08/2023   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   Poorly controlled type 2 diabetes mellitus with circulatory disorder (HCC) 02/08/2023   Vitamin D  deficiency 07/25/2022   Pain in both feet 02/10/2022   Peripheral neuropathy 01/25/2022   Seborrheic dermatitis 01/25/2022   Positive colorectal cancer screening using Cologuard test 03/16/2020   Chronic RLQ pain 03/16/2020   Type 2 diabetes mellitus with hyperglycemia, without long-term current use of insulin  (HCC) 06/02/2011   Hypertension 06/02/2011   Hyperlipemia 06/02/2011   Cataract 06/02/2011    Orientation RESPIRATION BLADDER Height & Weight     Self, Time, Situation, Place  Normal Continent Weight: 178 lb (80.7 kg) Height:  5' 11 (180.3 cm)  BEHAVIORAL SYMPTOMS/MOOD NEUROLOGICAL BOWEL NUTRITION STATUS      Continent Diet (see discharge summary)  AMBULATORY STATUS COMMUNICATION OF NEEDS Skin   Limited Assist Verbally Surgical wounds                       Personal Care  Assistance Level of Assistance  Bathing, Feeding, Dressing Bathing Assistance: Limited assistance Feeding assistance: Independent Dressing Assistance: Limited assistance     Functional Limitations Info  Sight, Hearing, Speech Sight Info: Adequate Hearing Info: Adequate Speech Info: Adequate    SPECIAL CARE FACTORS FREQUENCY  PT (By licensed PT), OT (By licensed OT)     PT Frequency: 5x week OT Frequency: 5x week            Contractures Contractures Info: Not present    Additional Factors Info  Code Status, Allergies, Insulin  Sliding Scale Code Status Info: full Allergies Info: NKA   Insulin  Sliding Scale Info: novolog -see discharge summary       Current Medications (05/17/2023):  This is the current hospital active medication list Current Facility-Administered Medications  Medication Dose Route Frequency Provider Last Rate Last Admin   acetaminophen  (TYLENOL ) tablet 1,000 mg  1,000 mg Oral Q6H PRN Sebastian Moles, MD   1,000 mg at 05/17/23 0407   ciprofloxacin  (CIPRO ) tablet 500 mg  500 mg Oral BID Zella Dewan, MD       clonazePAM  (KLONOPIN ) tablet 1.5 mg  1.5 mg Oral QHS Sebastian Moles, MD   1.5 mg at 05/16/23 2117   dextromethorphan -guaiFENesin  (MUCINEX  DM) 30-600 MG per 12 hr tablet 1 tablet  1 tablet Oral Q6H PRN Carlei Huang, MD   1 tablet at 05/15/23 1744   enoxaparin  (LOVENOX ) injection 40 mg  40 mg Subcutaneous Q24H Sebastian Moles, MD   40 mg at 05/17/23 0946   FLUoxetine  (PROZAC ) capsule  60 mg  60 mg Oral q morning Sebastian Moles, MD   60 mg at 05/17/23 9053   hydrALAZINE  (APRESOLINE ) injection 10 mg  10 mg Intravenous Q2H PRN Sebastian Moles, MD       hydrOXYzine  (ATARAX ) tablet 50 mg  50 mg Oral QHS PRN Sebastian Moles, MD       insulin  aspart (novoLOG ) injection 0-15 Units  0-15 Units Subcutaneous TID WC Sebastian Moles, MD   3 Units at 05/17/23 1247   insulin  aspart (novoLOG ) injection 0-5 Units  0-5 Units Subcutaneous QHS Sebastian Moles, MD        lactose free nutrition (BOOST PLUS) liquid 237 mL  237 mL Oral TID WC Aron Shoulders, MD   237 mL at 05/17/23 1248   lipase/protease/amylase (CREON ) capsule 36,000 Units  36,000 Units Oral BID BM PRN Aron Shoulders, MD       lipase/protease/amylase (CREON ) capsule 72,000 Units  72,000 Units Oral TID THEOPOLIS Aron Shoulders, MD   72,000 Units at 05/17/23 1247   megestrol  (MEGACE ) 400 MG/10ML suspension 400 mg  400 mg Oral Daily Sebastian Moles, MD   400 mg at 05/17/23 9053   methocarbamol  (ROBAXIN ) tablet 500 mg  500 mg Oral Q6H PRN Sebastian Moles, MD       metroNIDAZOLE  (FLAGYL ) tablet 500 mg  500 mg Oral Q12H Aron Shoulders, MD       morphine  (PF) 2 MG/ML injection 2 mg  2 mg Intravenous Q3H PRN Sebastian Moles, MD       oxyCODONE  (Oxy IR/ROXICODONE ) immediate release tablet 5-10 mg  5-10 mg Oral Q4H PRN Sebastian Moles, MD       pantoprazole  (PROTONIX ) EC tablet 40 mg  40 mg Oral QHS Sebastian Moles, MD   40 mg at 05/16/23 2117   prochlorperazine  (COMPAZINE ) tablet 10 mg  10 mg Oral Q6H PRN Sebastian Moles, MD       simethicone  (MYLICON) chewable tablet 80 mg  80 mg Oral Q6H PRN Sebastian Moles, MD   80 mg at 05/14/23 1553     Discharge Medications: Please see discharge summary for a list of discharge medications.  Relevant Imaging Results:  Relevant Lab Results:   Additional Information SSN: [id number]  Bridget Cordella Simmonds, LCSW   Agree with above information  Shoulders LITTIE Aron, MD, FACS, FSSO Surgical Oncology, General Surgery, Trauma and Critical [facility], [place] [phone] for weekday/non holidays Check [email or web address] for coverage night/weekend/holidays   d

## 2023-05-17 NOTE — Progress Notes (Signed)
 This RN was walking down the [place] and heard bed alarm going off. Patient was sitting on the toilet with a trail of feces following behind him.  Bed alarm was not ringing up to the desk.  This RN checked all connections from bed to wall.  All connections were connected.  Patient was having another bm.  This RN asked if he called for assistance and he seemed to be a bit confused.  Stated that he thinks that he did.  There was no occurrence of him ringing the bell.  Patient/floor was cleaned and patient was helped back to bed.  Patient was comfortable with the bed alarm on.

## 2023-05-18 ENCOUNTER — Ambulatory Visit: Payer: Medicare Other | Admitting: Internal Medicine

## 2023-05-18 LAB — CULTURE, BLOOD (ROUTINE X 2)
Culture: NO GROWTH
Culture: NO GROWTH

## 2023-05-18 LAB — GLUCOSE, CAPILLARY
Glucose-Capillary: 170 mg/dL — ABNORMAL HIGH (ref 70–99)
Glucose-Capillary: 144 mg/dL — ABNORMAL HIGH (ref 70–99)
Glucose-Capillary: 181 mg/dL — ABNORMAL HIGH (ref 70–99)
Glucose-Capillary: 156 mg/dL — ABNORMAL HIGH (ref 70–99)
Glucose-Capillary: 177 mg/dL — ABNORMAL HIGH (ref 70–99)

## 2023-05-18 MED ORDER — PANCRELIPASE (LIP-PROT-AMYL) 36000-114000 UNITS PO CPEP: 36000.0000 [IU] | ORAL_CAPSULE | Freq: Two times a day (BID) | ORAL | 5 refills | Status: DC | PRN

## 2023-05-18 MED ORDER — METRONIDAZOLE 500 MG PO TABS: 500.0000 mg | ORAL_TABLET | Freq: Two times a day (BID) | ORAL | 0 refills | Status: DC

## 2023-05-18 MED ORDER — DM-GUAIFENESIN ER 30-600 MG PO TB12: 1.0000 | ORAL_TABLET | Freq: Four times a day (QID) | ORAL | Status: DC | PRN

## 2023-05-18 MED ORDER — PANCRELIPASE (LIP-PROT-AMYL) 36000-114000 UNITS PO CPEP: 72000.0000 [IU] | ORAL_CAPSULE | Freq: Three times a day (TID) | ORAL | 5 refills | Status: AC

## 2023-05-18 MED ORDER — BOOST PLUS PO LIQD
237.0000 mL | Freq: Two times a day (BID) | ORAL | Status: DC
  Administered 2023-05-19 – 2023-05-22 (×6): 237 mL via ORAL
  Filled 2023-05-18 (×8): qty 237

## 2023-05-18 MED ORDER — CIPROFLOXACIN HCL 500 MG PO TABS: 500.0000 mg | ORAL_TABLET | Freq: Two times a day (BID) | ORAL | 0 refills | Status: DC

## 2023-05-18 NOTE — Discharge Instructions (Signed)
 [facility]      [facility], [place] [phone]  ABDOMINAL SURGERY: POST OP INSTRUCTIONS  Always review your discharge instruction sheet given to you by the facility where your surgery was performed.  IF YOU HAVE DISABILITY OR FAMILY LEAVE FORMS, YOU MUST BRING THEM TO THE OFFICE FOR PROCESSING.  PLEASE DO NOT GIVE THEM TO YOUR DOCTOR.  A prescription for pain medication may be given to you upon discharge.  Take your pain medication as prescribed, if needed.  If narcotic pain medicine is not needed, then you may take acetaminophen (Tylenol) or ibuprofen (Advil) as needed. Take your usually prescribed medications unless otherwise directed. If you need a refill on your pain medication, please contact your pharmacy. They will contact our office to request authorization.  Prescriptions will not be filled after 5pm or on week-ends. You should follow a light diet the first few days after arrival home, such as soup and crackers, pudding, etc.unless your doctor has advised otherwise. A high-fiber, low fat diet can be resumed as tolerated.   Be sure to include lots of fluids daily. Most patients will experience some swelling and bruising on the chest and neck area.  Ice packs will help.  Swelling and bruising can take several days to resolve Most patients will experience some swelling and bruising in the area of the incision. Ice pack will help. Swelling and bruising can take several days to resolve..  It is common to experience some constipation if taking pain medication after surgery.  Increasing fluid intake and taking a stool softener will usually help or prevent this problem from occurring.  A mild laxative (Milk of Magnesia or Miralax) should be taken according to package directions if there are no bowel movements after 48 hours.  You may have steri-strips (small skin tapes) in place directly over the incision.  These strips should be left on the skin for 10-14 days.  If your surgeon used skin glue on  the incision, you may shower in 48 hours.  The glue will flake off over the next 2-3 weeks.  Any sutures or staples will be removed at the office during your follow-up visit. You may find that a light gauze bandage over your incision may keep your staples from being rubbed or pulled. You may shower and replace the bandage daily. ACTIVITIES:  You may resume regular (light) daily activities beginning the next day--such as daily self-care, walking, climbing stairs--gradually increasing activities as tolerated.  You may have sexual intercourse when it is comfortable.  Refrain from any heavy lifting or straining until approved by your doctor. You may drive when you no longer are taking prescription pain medication, you can comfortably wear a seatbelt, and you can safely maneuver your car and apply brakes Return to Work: __________8 weeks if applicable_________________________ Andrew Tanner should see your doctor in the office for a follow-up appointment approximately two weeks after your surgery.  Make sure that you call for this appointment within a day or two after you arrive home to insure a convenient appointment time. OTHER INSTRUCTIONS:  _____________________________________________________________ _____________________________________________________________  WHEN TO CALL YOUR DOCTOR: Fever over 101.0 Inability to urinate Nausea and/or vomiting Extreme swelling or bruising Continued bleeding from incision. Increased pain, redness, or drainage from the incision. Difficulty swallowing or breathing Muscle cramping or spasms. Numbness or tingling in hands or feet or around lips.  The clinic staff is available to answer your questions during regular business hours.  Please don't hesitate to call and ask to speak to  one of the nurses if you have concerns.  For further questions, please visit [email or web address]

## 2023-05-18 NOTE — Discharge Summary (Addendum)
 Physician Discharge Summary  Patient ID: Andrew Tanner MRN: [id number] DOB/AGE: 1950-11-16 73 y.o.  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   Poorly controlled type 2 diabetes mellitus with circulatory disorder (HCC) 02/08/2023   Vitamin D deficiency 07/25/2022   Pain in both feet 02/10/2022   Peripheral neuropathy 01/25/2022   Seborrheic dermatitis 01/25/2022   Positive colorectal cancer screening using Cologuard test 03/16/2020   Chronic RLQ pain 03/16/2020   Type 2 diabetes mellitus with hyperglycemia, without long-term current use of insulin (HCC) 06/02/2011   Hypertension 06/02/2011   Hyperlipemia 06/02/2011   Cataract 06/02/2011    Discharge Diagnoses:  Principal Problem:   Vomiting and diarrhea Active Problems:   Protein-calorie malnutrition, severe Colitis Falls Muscular deconditioning  Discharged Condition: stable  Hospital Course:  Patient was readmitted following discharge approximately 3 to 4 days before after whipple (this occurred 04/27/23).  He had home health arranged but had a few minor falls and developed significant diarrhea.  He was placed on IV fluids and iv antibiotics.  C diff and stool pathogen panel was normal.  He started eating a bit better after his diarrhea improved.  He worked with therapy as well. He definitely was a bit weaker during the second admission.  Given that he failed d/c to home previously, decision was made to go to rehab or SNF for several weeks to get stronger prior transition to home.    He had kept one of his drains prior to d/c the first time.  The output was looking more clear and  repeat drain amylase was sent. This was still high, so drain was left in place.    He was at first denied by insurance to go to SNF.  He worked with therapy and nutrition. He is eating a bit better, but tires out and needs some assistance.  He also is having some cognitive issues that are requiring therapy.    Consults:  therapies, pharmacy, nutrition  Significant Diagnostic Studies: ct showed colitis.  WBCs 11.    Treatments: IV hydration, antibiotics: Cipro and metronidazole, and therapy  Discharge Exam: Blood pressure 125/80, pulse 82, temperature 98.4 F (36.9 C), resp. rate 16, height 5\' 11"  (1.803 m), weight 70.1 kg, SpO2 100%. General appearance: alert, cooperative, and no distress Resp: breathing comfortably GI: soft, non distended, non tender. Drain slightly cloudy/serous Extremities: extremities normal, atraumatic, no cyanosis or edema  Disposition:  Discharge disposition: 03-Skilled Nursing Facility       Discharge Instructions     Call MD for:  difficulty breathing, headache or visual disturbances   Complete by: As directed    Call MD for:  difficulty breathing, headache or visual disturbances   Complete by: As directed    Call MD for:  hives   Complete by: As directed    Call MD for:  hives   Complete by: As directed    Call MD for:  persistant nausea and vomiting   Complete by: As directed    Call MD for:  persistant nausea and vomiting   Complete by: As directed    Call MD for:  redness, tenderness, or signs of infection (pain, swelling, redness, odor or green/yellow discharge around incision  site)   Complete by: As directed    Call MD for:  redness, tenderness, or signs of infection (pain, swelling, redness, odor or green/yellow discharge around incision site)   Complete by: As directed    Call MD for:  severe uncontrolled pain   Complete by: As directed    Call MD for:  severe uncontrolled pain   Complete by: As directed    Call MD for:  temperature  >100.4   Complete by: As directed    Call MD for:  temperature >100.4   Complete by: As directed    Change dressing (specify)   Complete by: As directed    Measure and record drain output 1-3 times per day.  Bring record to clinic.    If output goes down to <10 ml/day x 3 days in a row before your appointment, call.   Change dressing (specify)   Complete by: As directed    Measure and record drain output.  Bring record to clinic.   Diet - low sodium heart healthy   Complete by: As directed    Diet - low sodium heart healthy   Complete by: As directed    Increase activity slowly   Complete by: As directed    Increase activity slowly   Complete by: As directed       Allergies as of 06/08/2023   No Known Allergies      Medication List     TAKE these medications    acetaminophen 500 MG tablet Commonly known as: TYLENOL Take 2 tablets (1,000 mg total) by mouth every 6 (six) hours as needed for mild pain (pain score 1-3).   alfuzosin 10 MG 24 hr tablet Commonly known as: UROXATRAL Take 1 tablet (10 mg total) by mouth daily with breakfast.   clonazePAM 0.5 MG tablet Commonly known as: KLONOPIN Take 1.5 mg by mouth at bedtime.   Dexcom G7 Sensor Misc Use to monitor glucose continuously, change every 10 days   feeding supplement Liqd Take 237 mLs by mouth 2 (two) times daily between meals.   FLUoxetine 20 MG capsule Commonly known as: PROZAC Take 60 mg by mouth every morning.   Gerhardt's butt cream Crea Apply 1 Application topically 2 (two) times daily.   glucose blood test strip 1 each by Other route in the morning and at bedtime. Use as instructed checking once per day, or if new symptoms.   hydrOXYzine 50 MG tablet Commonly known as: ATARAX Take 50 mg by mouth at bedtime as needed (sleep/anxiety.).   insulin aspart 100 UNIT/ML injection Commonly known as: novoLOG Inject 0-5 Units into the skin at bedtime.   insulin aspart 100 UNIT/ML injection Commonly  known as: novoLOG Inject 0-9 Units into the skin 3 (three) times daily with meals.   insulin aspart 100 UNIT/ML injection Commonly known as: novoLOG Inject 4 Units into the skin 3 (three) times daily with meals.   Insulin Pen Needle 32G X 4 MM Misc Use 2x a day   B-D UF III MINI PEN NEEDLES 31G X 5 MM Misc Generic drug: Insulin Pen Needle USE DAILY AT 12 NOON   ketoconazole 2 % shampoo Commonly known as: NIZORAL APPLY TO SCALP AND FACE 2 TO 3 TIMES A WEEK.   Lantus SoloStar 100 UNIT/ML Solostar Pen Generic drug: insulin glargine Inject 12 Units into the skin at bedtime. What changed: how much to take   lipase/protease/amylase 78295 UNITS Cpep capsule Commonly known as: CREON Take 2 capsules (72,000  Units total) by mouth 3 (three) times daily before meals. What changed:  medication strength how much to take   lipase/protease/amylase 30865 UNITS Cpep capsule Commonly known as: CREON Take 1 capsule (36,000 Units total) by mouth 2 (two) times daily between meals as needed (with snacks). What changed: You were already taking a medication with the same name, and this prescription was added. Make sure you understand how and when to take each.   Lyumjev KwikPen 100 UNIT/ML KwikPen Generic drug: Insulin Lispro-aabc Inject 3-6 units under skin of insulin before dinner What changed:  how much to take how to take this when to take this additional instructions   megestrol 40 MG/ML suspension Commonly known as: MEGACE Take 10 mLs (400 mg total) by mouth daily.   methocarbamol 500 MG tablet Commonly known as: ROBAXIN Take 1 tablet (500 mg total) by mouth every 6 (six) hours as needed for muscle spasms (pain).   multivitamin with minerals Tabs tablet Take 1 tablet by mouth daily. Start taking on: June 09, 2023   nystatin-triamcinolone ointment Commonly known as: MYCOLOG Apply to scalp and face twice a day for up to 1 week and stop What changed:  how much to take how  to take this when to take this additional instructions   oxyCODONE 5 MG immediate release tablet Commonly known as: Oxy IR/ROXICODONE Take 1 tablet (5 mg total) by mouth every 4 (four) hours as needed for severe pain (pain score 7-10), breakthrough pain or moderate pain (pain score 4-6).   pantoprazole 40 MG tablet Commonly known as: PROTONIX Take 1 tablet (40 mg total) by mouth at bedtime.   polyethylene glycol 17 g packet Commonly known as: MIRALAX / GLYCOLAX Take 17 g by mouth daily as needed (constipation.).   prochlorperazine 10 MG tablet Commonly known as: COMPAZINE Take 1 tablet (10 mg total) by mouth every 6 (six) hours as needed for nausea or vomiting (Use for nausea and / or vomiting unresolved with ondansetron (Zofran).).   rosuvastatin 20 MG tablet Commonly known as: Crestor Take 1 tablet (20 mg total) by mouth daily.   simethicone 80 MG chewable tablet Commonly known as: MYLICON Chew 1 tablet (80 mg total) by mouth every 6 (six) hours as needed for flatulence.               Discharge Care Instructions  (From admission, onward)           Start     Ordered   06/08/23 0000  Change dressing (specify)       Comments: Measure and record drain output.  Bring record to clinic.   06/08/23 1605   05/18/23 0000  Change dressing (specify)       Comments: Measure and record drain output 1-3 times per day.  Bring record to clinic.    If output goes down to <10 ml/day x 3 days in a row before your appointment, call.   05/18/23 1250            Contact information for follow-up providers     Jeani Sow, MD Follow up.   Specialty: Family Medicine Contact information: [address] [place] [address] [phone]         [name], MD Follow up in 2 week(s).   Specialty: General Surgery Contact information: [address] [place] [address] [phone]              Contact information for  after-discharge care  Destination     [facility] Preferred SNF .   Service: Skilled Nursing Contact information: [address] [place] [address] [phone]                     Signed: [name] 06/08/2023, 4:05 PM

## 2023-05-18 NOTE — Care Management Important Message (Signed)
 Important Message  Patient Details  Name: Andrew Tanner MRN: [id number] Date of Birth: 11-21-1950   Important Message Given:  Yes - Medicare IM     Wynonia Hedges 05/18/2023, 12:11 PM

## 2023-05-18 NOTE — Progress Notes (Signed)
 RE:   Andrew Tanner     Date of Birth:  05-17-2050     Date:   05/18/23       To Whom It May Concern:  Please be advised that the above-named patient will require a short-term nursing home stay - anticipated 30 days or less for rehabilitation and strengthening.  The plan is for return home.                 MD signature                Date

## 2023-05-18 NOTE — Plan of Care (Signed)

## 2023-05-18 NOTE — Progress Notes (Signed)
 Calorie Count Note  48 hour calorie count ordered.  Pt resting in bed during visit. Breakfast tray present at bedside and was not well consumed. Pt consistently only meeting 50% of needs. Per wife's report upon initial assessment this is about all he was eating at home as well. Pt requested less frequency of Boost; changing to BID. Noted 3 unopened Boost on bedside table. Pt reports he is still having diarrhea but frequency of it has improved. Encouraged pt to request digestive enzymes when eating snacks. Pt unsure if he is receiving snacks.  Diet: Soft Supplements: Boost Plus TID  Estimated Nutritional Needs:  Kcal:  2100-2300 Protein:  105-120 g Fluid:  >/= 2 L  2/5 Lunch: 332 kcal, 26 g protein 2/5 Dinner: 170 kcal, 12 g protein 2/6: Breakfast 170 kcal, 7 g protein Supplements: 1/2 Boost Plus: 175 kcal, 7 g protein, 1/2 Powerade: 100 kcal, 0 g protein  Total intake: 947 kcal (45% of minimum estimated needs)  52 g protein (50% of minimum estimated needs)  INTERVENTION:  Boost Plus po BID, each supplement provides 360 kcal and 14 grams of protein per pt preference  Small protein containing snacks between meals  Continue soft diet per MD recommendations  Request new measured weight   NUTRITION DIAGNOSIS:  Severe Malnutrition (in the context of acute illness/injury) related to poor appetite (malabsorption) as evidenced by moderate fat depletion, severe muscle depletion, moderate muscle depletion, energy intake < or equal to 50% for > or equal to 5 days.   GOAL:  Patient will meet greater than or equal to 90% of their needs  Tinnie Ferraris, MS Dietetic Intern

## 2023-05-18 NOTE — Progress Notes (Signed)
 Physical Therapy Treatment Patient Details Name: Andrew Tanner MRN: [id number] DOB: 1950-04-15 Today's Date: 05/18/2023   History of Present Illness 72yo M admitted 2/1 after fall at home and diarrhea.  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 Aron was D/C home 05/10/23. CT scan of the abdomen and pelvis shows diffuse colitis.  He has a small fluid collection near the pancreatico jejunal anastomosis.  PMH: anxiety, DM    PT Comments  Pt seen for PT tx with pt asleep but awakened & agreeable to tx. Pt is able to complete bed mobility with supervision with hospital bed features, STS with CGA. Pt ambulates room>gym>additional gait in hallway with RW & CGA with impaired gait pattern as noted below. Pt requires cuing to ambulate within base of AD & assistance at times to maneuver RW around objects on pt's left. Pt negotiates steps with B rails & min assist. Pt would benefit from ongoing PT services to address balance, gait, & stair negotiation to reduce fall risk.    If plan is discharge home, recommend the following: A little help with walking and/or transfers;A little help with bathing/dressing/bathroom;Assistance with cooking/housework;Assist for transportation;Help with stairs or ramp for entrance;Supervision due to cognitive status   Can travel by private vehicle     Yes  Equipment Recommendations  None recommended by PT    Recommendations for Other Services Rehab consult     Precautions / Restrictions Precautions Precautions: Fall Precaution Comments: JP drain right side Restrictions Weight Bearing Restrictions Per Provider Order: No     Mobility  Bed Mobility Overal bed mobility: Needs Assistance Bed Mobility: Supine to Sit   Sidelying to sit: Supervision, HOB elevated (pt reaching for therapist's hand, PT encouraged pt to use bed rails instead & pt able to complete supine>sit with supervision with HOB elevated, bed rails)            Transfers Overall  transfer level: Needs assistance Equipment used: Rolling walker (2 wheels) Transfers: Sit to/from Stand Sit to Stand: Contact guard assist           General transfer comment: education re: hand placement to push to standing    Ambulation/Gait Ambulation/Gait assistance: Contact guard assist Gait Distance (Feet):  (>200 ft) Assistive device: Rolling walker (2 wheels) Gait Pattern/deviations: Decreased step length - right, Decreased stride length, Decreased step length - left, Decreased dorsiflexion - right, Decreased dorsiflexion - left Gait velocity: decreased     General Gait Details: PT encouraged pt to lift foot for stepping vs sliding foot on floor. Pt with decreased foot clearance BLE. Pt with difficulty manuvering RW around obstacles, required min assist at times. PT provided cuing to ambulate within base of AD vs pushing it out in front of him.   Stairs Stairs: Yes Stairs assistance: Min assist Stair Management: Two rails, Alternating pattern Number of Stairs: 10 (6 + 3) General stair comments: Pt negotiates stairs with B rails & min assist, cuing to stand closer to edge prior to stepping down. Pt appears to feel his way down with foot vs purposefully placing foot on lower step, pt reports he has neuropathy.   Wheelchair Mobility     Tilt Bed    Modified Rankin (Stroke Patients Only)       Balance Overall balance assessment: Needs assistance Sitting-balance support: Feet supported Sitting balance-Leahy Scale: Fair Sitting balance - Comments: supervision static sitting   Standing balance support: During functional activity, Bilateral upper extremity supported, Reliant on assistive device for balance Standing  balance-Leahy Scale: Poor                              Cognition Arousal: Alert Behavior During Therapy: WFL for tasks assessed/performed Overall Cognitive Status: Impaired/Different from baseline Area of Impairment: Attention, Memory,  Safety/judgement, Following commands, Awareness, Problem solving                   Current Attention Level: Selective Memory: Decreased recall of precautions, Decreased short-term memory Following Commands: Follows one step commands consistently, Follows one step commands with increased time Safety/Judgement: Decreased awareness of safety, Decreased awareness of deficits Awareness: Emergent Problem Solving: Slow processing, Decreased initiation, Difficulty sequencing, Requires verbal cues, Requires tactile cues          Exercises      General Comments        Pertinent Vitals/Pain Pain Assessment Pain Assessment: Faces Faces Pain Scale: Hurts a little bit Pain Location: soreness at JP drain abdominal insertion site Pain Descriptors / Indicators: Sore Pain Intervention(s): Monitored during session    Home Living                          Prior Function            PT Goals (current goals can now be found in the care plan section) Acute Rehab PT Goals Patient Stated Goal: 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 PT Goal Formulation: With patient Time For Goal Achievement: 05/29/23 Potential to Achieve Goals: Good Progress towards PT goals: Progressing toward goals    Frequency    Min 1X/week      PT Plan      Co-evaluation              AM-PAC PT 6 Clicks Mobility   Outcome Measure  Help needed turning from your back to your side while in a flat bed without using bedrails?: A Little Help needed moving from lying on your back to sitting on the side of a flat bed without using bedrails?: A Little Help needed moving to and from a bed to a chair (including a wheelchair)?: A Little Help needed standing up from a chair using your arms (e.g., wheelchair or bedside chair)?: A Little Help needed to walk in hospital room?: A Little Help needed climbing 3-5 steps with a railing? : A Little 6 Click Score: 18    End of Session   Activity  Tolerance: Patient tolerated treatment well Patient left: in chair;with chair alarm set;with call bell/phone within reach Nurse Communication: Mobility status PT Visit Diagnosis: Unsteadiness on feet (R26.81);Other abnormalities of gait and mobility (R26.89);Muscle weakness (generalized) (M62.81)     Time: 8591-8577 PT Time Calculation (min) (ACUTE ONLY): 14 min  Charges:    $Therapeutic Activity: 8-22 mins PT General Charges $$ ACUTE PT VISIT: 1 Visit                     Richerd Pinal, PT, DPT 05/18/23, 2:30 PM   Richerd CHRISTELLA Pinal 05/18/2023, 2:28 PM

## 2023-05-18 NOTE — TOC Progression Note (Addendum)
 Transition of Care [facility]) - Progression Note    Patient Details  Name: Andrew Tanner MRN: [id number] Date of Birth: 09/13/1950  [facility]) CM/SW Contact  Bridget Cordella Simmonds, LCSW Phone Number: 05/18/2023, 8:42 AM  Clinical Narrative:   MD cosign does not appear on 30 day note, unable to submit to [facility] Must.  Message left with MD office.  [facility] is fulls.   1400: documents have been submitted to [facility] for passr but rejected due to no signature appearing on 30 day note.    1500: CSW has reached out to [facility] several times today, this is families first choice.  [facility] is reviewing the referral but will not have an decision on bed offer until sometime tomorrow.     1610: Left message with wife with bed offer update.  1630: ROYCE Miller, she would prefer to see if [facility] can offer bed.  PASSR received: [id number]  Expected Discharge Plan: Skilled Nursing Facility Barriers to Discharge: Continued Medical Work up, SNF Pending bed offer  Expected Discharge Plan and Services In-house Referral: Clinical Social Work   Post Acute Care Choice: Home Health Living arrangements for the past 2 months: Single Family Home                                       Social Determinants of Health (SDOH) Interventions SDOH Screenings   Food Insecurity: No Food Insecurity (05/14/2023)  Housing: Low Risk  (05/14/2023)  Transportation Needs: No Transportation Needs (05/14/2023)  Utilities: Not At Risk (05/14/2023)  Alcohol Screen: Low Risk  (10/19/2020)  Depression (PHQ2-9): Low Risk  (03/20/2023)  Financial Resource Strain: Low Risk  (03/16/2023)  Physical Activity: Sufficiently Active (03/16/2023)  Social Connections: Socially Integrated (05/14/2023)  Stress: No Stress Concern Present (03/16/2023)  Tobacco Use: Medium Risk (05/13/2023)  Health Literacy: Adequate Health Literacy (01/03/2023)    Readmission Risk Interventions    05/15/2023    8:24 AM  Readmission  Risk Prevention Plan  Transportation Screening Complete  PCP or Specialist Appt within 3-5 Days Complete  HRI or Home Care Consult Complete

## 2023-05-19 LAB — CBC
HCT: 38.4 % — ABNORMAL LOW (ref 39.0–52.0)
Hemoglobin: 12.9 g/dL — ABNORMAL LOW (ref 13.0–17.0)
MCH: 30.1 pg (ref 26.0–34.0)
MCHC: 33.6 g/dL (ref 30.0–36.0)
MCV: 89.7 fL (ref 80.0–100.0)
Platelets: 369 10*3/uL (ref 150–400)
RBC: 4.28 MIL/uL (ref 4.22–5.81)
RDW: 15 % (ref 11.5–15.5)
WBC: 7.6 10*3/uL (ref 4.0–10.5)
nRBC: 0 % (ref 0.0–0.2)

## 2023-05-19 LAB — BASIC METABOLIC PANEL
Anion gap: 9 (ref 5–15)
BUN: 11 mg/dL (ref 8–23)
CO2: 20 mmol/L — ABNORMAL LOW (ref 22–32)
Calcium: 9.2 mg/dL (ref 8.9–10.3)
Chloride: 108 mmol/L (ref 98–111)
Creatinine, Ser: 0.75 mg/dL (ref 0.61–1.24)
GFR, Estimated: >60 mL/min (ref >60)
Glucose, Bld: 172 mg/dL — ABNORMAL HIGH (ref 70–99)
Potassium: 3.6 mmol/L (ref 3.5–5.1)
Sodium: 137 mmol/L (ref 135–145)

## 2023-05-19 LAB — GLUCOSE, CAPILLARY
Glucose-Capillary: 187 mg/dL — ABNORMAL HIGH (ref 70–99)
Glucose-Capillary: 187 mg/dL — ABNORMAL HIGH (ref 70–99)
Glucose-Capillary: 188 mg/dL — ABNORMAL HIGH (ref 70–99)
Glucose-Capillary: 165 mg/dL — ABNORMAL HIGH (ref 70–99)

## 2023-05-19 NOTE — Progress Notes (Addendum)
 Subjective/Chief Complaint: 40-50 % of caloric need documented yesterday.  Decreased stools. Denies pain.   Objective: Vital signs in last 24 hours: Temp:  [98 F (36.7 C)-98.7 F (37.1 C)] 98 F (36.7 C) (02/07 0800) Pulse Rate:  [76-103] 86 (02/07 0800) Resp:  [17-18] 18 (02/07 0800) BP: (119-144)/(68-88) 144/88 (02/07 0800) SpO2:  [98 %-100 %] 98 % (02/07 0800) Weight:  [73 kg] 73 kg (02/07 0449) Last BM Date : 05/18/23  Intake/Output from previous day: 02/06 0701 - 02/07 0700 In: -  Out: 475 [Urine:385; Drains:90] Intake/Output this shift: No intake/output data recorded.  General:  looks a bit weak, but improving.  Abdomen:  soft non distended, non tender Drain much less murky  Lab Results:  Recent Labs    05/19/23 0427  WBC 7.6  HGB 12.9*  HCT 38.4*  PLT 369   BMET Recent Labs    05/19/23 0427  NA 137  K 3.6  CL 108  CO2 20*  GLUCOSE 172*  BUN 11  CREATININE 0.75  CALCIUM  9.2   PT/INR No results for input(s): LABPROT, INR in the last 72 hours. ABG No results for input(s): PHART, HCO3 in the last 72 hours.  Invalid input(s): PCO2, PO2  Studies/Results: No results found.   Anti-infectives: Anti-infectives (From admission, onward)    Start     Dose/Rate Route Frequency Ordered Stop   05/18/23 0000  ciprofloxacin  (CIPRO ) 500 MG tablet        500 mg Oral 2 times daily 05/18/23 1250     05/18/23 0000  metroNIDAZOLE  (FLAGYL ) 500 MG tablet        500 mg Oral Every 12 hours 05/18/23 1250     05/17/23 2000  ciprofloxacin  (CIPRO ) tablet 500 mg        500 mg Oral 2 times daily 05/17/23 1128 05/21/23 0759   05/17/23 2000  metroNIDAZOLE  (FLAGYL ) tablet 500 mg        500 mg Oral Every 12 hours 05/17/23 1128 05/21/23 0759   05/14/23 0245  ciprofloxacin  (CIPRO ) IVPB 400 mg  Status:  Discontinued        400 mg 200 mL/hr over 60 Minutes Intravenous Every 12 hours 05/14/23 0227 05/17/23 1128   05/14/23 0245  metroNIDAZOLE  (FLAGYL ) IVPB 500 mg   Status:  Discontinued        500 mg 100 mL/hr over 60 Minutes Intravenous Every 12 hours 05/14/23 0227 05/17/23 1128       Assessment/Plan: 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   Readmitted with colitis and fall.  No apparent injury from fall IV antibiotics for colitis, switched to oral today.  Plan 7 d total, d/w pharmacy.  C diff negative as well as stool pathogen panel. Respiratory panel negative.   PT/OT consult.  TOC consult.  At prior discharge, patient and wife were at odds regarding discharge plan.  I discussed with patient that it is not safe for home discharge at this time.   Lovenox  for vte ppx IV fluids for diarrhea/rehydration, off now.  Rehydrated.  Diarrhea improving. Insulin  for DM  Soft diet. Megace  for appetite stimulation and protein calorie malnutrition. Calorie counts.  Drain amylase elevated c/w panc leak.   Await placement. Ok for d/c to SNF/rehab when bed available. D/c summary signed and meds reconciled.  D/c instructions in place.   FL2 signed.         LOS: 6 days    Jina Nephew 05/19/2023

## 2023-05-19 NOTE — Progress Notes (Signed)
 Mobility Specialist Progress Note:    05/19/23 1400  Mobility  Activity Ambulated with assistance in hallway  Level of Assistance Contact guard assist, steadying assist  Assistive Device Front wheel walker  Distance Ambulated (ft) 130 ft  Activity Response Tolerated well  Mobility Referral Yes  Mobility visit 1 Mobility  Mobility Specialist Start Time (ACUTE ONLY) 1400  Mobility Specialist Stop Time (ACUTE ONLY) 1414  Mobility Specialist Time Calculation (min) (ACUTE ONLY) 14 min   Received pt in bed having no complaints and agreeable to mobility. Pt was asymptomatic throughout ambulation and returned to room w/o fault. Left in bed w/ call bell in reach and all needs met.   D'Vante Nicholaus Mobility Specialist Please contact via Special Educational Needs Teacher or Rehab office at [phone]

## 2023-05-19 NOTE — TOC Progression Note (Signed)
 Transition of Care [facility]) - Progression Note    Patient Details  Name: Andrew Tanner MRN: [id number] Date of Birth: 1950-05-15  Transition of Care [facility]) CM/SW Contact  Bridget Cordella Simmonds, LCSW Phone Number: 05/19/2023, 12:53 PM  Clinical Narrative:   Message from Katie/[facility]: still unable to offer bed at this time.  She has confirmed pt is on their wait list.  CSW spoke with pt wife [name], updated her on the above and she will accept offer at [facility].  CSW spoke with Nikki/[facility]: no bed today, likely Monday until bed available.   Auth request submitted in [place].     Expected Discharge Plan: Skilled Nursing Facility Barriers to Discharge: Continued Medical Work up, SNF Pending bed offer  Expected Discharge Plan and Services In-house Referral: Clinical Social Work   Post Acute Care Choice: Home Health Living arrangements for the past 2 months: Single Family Home                                       Social Determinants of Health (SDOH) Interventions SDOH Screenings   Food Insecurity: No Food Insecurity (05/14/2023)  Housing: Low Risk  (05/14/2023)  Transportation Needs: No Transportation Needs (05/14/2023)  Utilities: Not At Risk (05/14/2023)  Alcohol Screen: Low Risk  (10/19/2020)  Depression (PHQ2-9): Low Risk  (03/20/2023)  Financial Resource Strain: Low Risk  (03/16/2023)  Physical Activity: Sufficiently Active (03/16/2023)  Social Connections: Socially Integrated (05/14/2023)  Stress: No Stress Concern Present (03/16/2023)  Tobacco Use: Medium Risk (05/13/2023)  Health Literacy: Adequate Health Literacy (01/03/2023)    Readmission Risk Interventions    05/15/2023    8:24 AM  Readmission Risk Prevention Plan  Transportation Screening Complete  PCP or Specialist Appt within 3-5 Days Complete  HRI or Home Care Consult Complete

## 2023-05-20 LAB — CREATININE, SERUM
Creatinine, Ser: 0.75 mg/dL (ref 0.61–1.24)
GFR, Estimated: >60 mL/min (ref >60)

## 2023-05-20 LAB — GLUCOSE, CAPILLARY
Glucose-Capillary: 203 mg/dL — ABNORMAL HIGH (ref 70–99)
Glucose-Capillary: 189 mg/dL — ABNORMAL HIGH (ref 70–99)
Glucose-Capillary: 159 mg/dL — ABNORMAL HIGH (ref 70–99)
Glucose-Capillary: 152 mg/dL — ABNORMAL HIGH (ref 70–99)

## 2023-05-20 NOTE — Plan of Care (Signed)

## 2023-05-20 NOTE — Progress Notes (Signed)
 Assessment & Plan: s/p Whipple 04/27/23 - Dr. Aron   - Readmitted with colitis and fall, diarrhea - IV antibiotics for colitis, switched to oral.  Plan 7 d total, d/w pharmacy.  - C diff negative as well as stool pathogen panel. Respiratory panel negative.    PT/OT consult.   TOC consult.   Lovenox  for vte ppx IV fluids for diarrhea/rehydration, off now.  Rehydrated.  Diarrhea improving. Insulin  for DM   Soft diet. Megace  for appetite stimulation and protein calorie malnutrition. Calorie counts.   Drain amylase elevated c/w panc leak.    Await placement. Ok for d/c to SNF/rehab when bed available. D/c summary signed and meds reconciled.  D/c instructions in place.   FL2 signed.   Appears that his desired facilities will have bed availability on Monday, 2/10.        Krystal Spinner, MD [facility] practice Office: [phone]        Chief Complaint: Diarrhea following [facility]  Subjective: Patient in bed, comfortable.  Nurse at bedside.  Objective: Vital signs in last 24 hours: Temp:  [98.8 F (37.1 C)-99 F (37.2 C)] 99 F (37.2 C) (02/08 0826) Pulse Rate:  [86-89] 86 (02/08 0826) Resp:  [15-20] 15 (02/08 0826) BP: (131-151)/(79-88) 142/88 (02/08 0826) SpO2:  [98 %-99 %] 99 % (02/08 0826) Last BM Date : 05/18/23  Intake/Output from previous day: 02/07 0701 - 02/08 0700 In: 236 [P.O.:236] Out: 240 [Urine:100; Drains:140] Intake/Output this shift: No intake/output data recorded.  Physical Exam: HEENT - sclerae clear, mucous membranes moist Neck - soft Abdomen - soft, scaphoid; midline wound dry and intact; JP with small cloudy white output Ext - no edema, non-tender  Lab Results:  Recent Labs    05/19/23 0427  WBC 7.6  HGB 12.9*  HCT 38.4*  PLT 369   BMET Recent Labs    05/19/23 0427 05/20/23 0711  NA 137  --   K 3.6  --   CL 108  --   CO2 20*  --   GLUCOSE 172*  --   BUN 11  --   CREATININE 0.75 0.75  CALCIUM   9.2  --    PT/INR No results for input(s): LABPROT, INR in the last 72 hours. Comprehensive Metabolic Panel:    Component Value Date/Time   NA 137 05/19/2023 0427   NA 138 05/14/2023 1634   NA 134 01/20/2020 1053   NA 133 (L) 09/12/2019 1053   K 3.6 05/19/2023 0427   K 4.1 05/14/2023 1634   CL 108 05/19/2023 0427   CL 110 05/14/2023 1634   CO2 20 (L) 05/19/2023 0427   CO2 16 (L) 05/14/2023 1634   BUN 11 05/19/2023 0427   BUN 14 05/14/2023 1634   BUN 22 01/20/2020 1053   BUN 21 09/12/2019 1053   CREATININE 0.75 05/20/2023 0711   CREATININE 0.75 05/19/2023 0427   CREATININE 1.07 04/06/2021 0913   CREATININE 0.88 03/10/2014 1007   GLUCOSE 172 (H) 05/19/2023 0427   GLUCOSE 155 (H) 05/14/2023 1634   CALCIUM  9.2 05/19/2023 0427   CALCIUM  9.0 05/14/2023 1634   AST 20 05/13/2023 1945   AST 33 05/09/2023 0957   ALT 32 05/13/2023 1945   ALT 63 (H) 05/09/2023 0957   ALKPHOS 120 05/13/2023 1945   ALKPHOS 160 (H) 05/09/2023 0957   BILITOT 0.7 05/13/2023 1945   BILITOT 0.6 05/09/2023 0957   BILITOT 0.3 01/20/2020 1053   BILITOT 0.2 09/12/2019 1053  PROT 5.5 (L) 05/13/2023 1945   PROT 5.0 (L) 05/09/2023 0957   PROT 6.3 02/10/2022 1157   PROT 6.5 01/20/2020 1053   ALBUMIN  2.4 (L) 05/13/2023 1945   ALBUMIN  2.2 (L) 05/09/2023 0957   ALBUMIN  4.4 01/20/2020 1053   ALBUMIN  4.5 09/12/2019 1053    Studies/Results: No results found.    Krystal Spinner 05/20/2023  Patient ID: Alm Ram, male   DOB: 02-09-1951, 73 y.o.   MRN: [id number]

## 2023-05-20 NOTE — Progress Notes (Signed)
 Mobility Specialist Progress Note:   05/20/23 1042  Mobility  Activity Ambulated with assistance in hallway  Level of Assistance Contact guard assist, steadying assist  Assistive Device Front wheel walker  Distance Ambulated (ft) 130 ft  Activity Response Tolerated well  Mobility Referral Yes  Mobility visit 1 Mobility  Mobility Specialist Start Time (ACUTE ONLY) 1042  Mobility Specialist Stop Time (ACUTE ONLY) 1100  Mobility Specialist Time Calculation (min) (ACUTE ONLY) 18 min   Pt asleep upon arrival, agreeable to mobility session. Required only contact assist during ambulation d/t minor unsteadiness. Pt states I'm still sleepy. Stopped multiple times to stretch out my back. Required cues for RW proximity throughout. Pt back in bed with all needs met, wife present.   Therisa Rana Mobility Specialist Please contact via SecureChat or  Rehab office at [phone]

## 2023-05-20 NOTE — Plan of Care (Signed)
   Problem: Fluid Volume: Goal: Ability to maintain a balanced intake and output will improve Outcome: Progressing   Problem: Health Behavior/Discharge Planning: Goal: Ability to manage health-related needs will improve Outcome: Progressing

## 2023-05-21 LAB — GLUCOSE, CAPILLARY
Glucose-Capillary: 164 mg/dL — ABNORMAL HIGH (ref 70–99)
Glucose-Capillary: 175 mg/dL — ABNORMAL HIGH (ref 70–99)
Glucose-Capillary: 147 mg/dL — ABNORMAL HIGH (ref 70–99)
Glucose-Capillary: 180 mg/dL — ABNORMAL HIGH (ref 70–99)

## 2023-05-21 NOTE — Progress Notes (Signed)
 Assessment & Plan: s/p Whipple 04/27/23 - Dr. Aron             - Readmitted with colitis and fall, diarrhea - IV antibiotics for colitis, switched to oral.  Plan 7 d total, d/w pharmacy.  - C diff negative as well as stool pathogen panel. Respiratory panel negative - diarrhea improved, taking po diet    PT/OT consult.   TOC consult.    Lovenox  for vte ppx Insulin  for DM   Soft diet. Megace  for appetite stimulation and protein calorie malnutrition. Calorie counts.   Drain amylase elevated c/w panc leak.    Await placement. Ok for d/c to SNF/rehab when bed available. D/c summary signed and meds reconciled.  D/c instructions in place.   FL2 signed.    Appears that his desired facilities will have bed availability on Monday, 2/10.        Krystal Spinner, MD [facility] practice Office: [phone]        Chief Complaint: Diarrhea, dehydration  Subjective: Patient brighter, no complaints this AM.  Diarrhea improved.  Objective: Vital signs in last 24 hours: Temp:  [97.9 F (36.6 C)-98.7 F (37.1 C)] 98.7 F (37.1 C) (02/09 0750) Pulse Rate:  [80-90] 90 (02/09 0750) Resp:  [16-18] 18 (02/09 0750) BP: (126-140)/(83-93) 140/84 (02/09 0750) SpO2:  [99 %-100 %] 100 % (02/09 0750) Weight:  [73 kg] 73 kg (02/09 0500) Last BM Date : 05/20/23  Intake/Output from previous day: 02/08 0701 - 02/09 0700 In: 720 [P.O.:720] Out: 495 [Urine:350; Drains:145] Intake/Output this shift: No intake/output data recorded.  Physical Exam: HEENT - sclerae clear, mucous membranes moist Abdomen - soft, scaphoid, non-tender  Lab Results:  Recent Labs    05/19/23 0427  WBC 7.6  HGB 12.9*  HCT 38.4*  PLT 369   BMET Recent Labs    05/19/23 0427 05/20/23 0711  NA 137  --   K 3.6  --   CL 108  --   CO2 20*  --   GLUCOSE 172*  --   BUN 11  --   CREATININE 0.75 0.75  CALCIUM  9.2  --    PT/INR No results for input(s): LABPROT, INR in the last  72 hours. Comprehensive Metabolic Panel:    Component Value Date/Time   NA 137 05/19/2023 0427   NA 138 05/14/2023 1634   NA 134 01/20/2020 1053   NA 133 (L) 09/12/2019 1053   K 3.6 05/19/2023 0427   K 4.1 05/14/2023 1634   CL 108 05/19/2023 0427   CL 110 05/14/2023 1634   CO2 20 (L) 05/19/2023 0427   CO2 16 (L) 05/14/2023 1634   BUN 11 05/19/2023 0427   BUN 14 05/14/2023 1634   BUN 22 01/20/2020 1053   BUN 21 09/12/2019 1053   CREATININE 0.75 05/20/2023 0711   CREATININE 0.75 05/19/2023 0427   CREATININE 1.07 04/06/2021 0913   CREATININE 0.88 03/10/2014 1007   GLUCOSE 172 (H) 05/19/2023 0427   GLUCOSE 155 (H) 05/14/2023 1634   CALCIUM  9.2 05/19/2023 0427   CALCIUM  9.0 05/14/2023 1634   AST 20 05/13/2023 1945   AST 33 05/09/2023 0957   ALT 32 05/13/2023 1945   ALT 63 (H) 05/09/2023 0957   ALKPHOS 120 05/13/2023 1945   ALKPHOS 160 (H) 05/09/2023 0957   BILITOT 0.7 05/13/2023 1945   BILITOT 0.6 05/09/2023 0957   BILITOT 0.3 01/20/2020 1053   BILITOT 0.2 09/12/2019 1053   PROT 5.5 (L)  05/13/2023 1945   PROT 5.0 (L) 05/09/2023 0957   PROT 6.3 02/10/2022 1157   PROT 6.5 01/20/2020 1053   ALBUMIN  2.4 (L) 05/13/2023 1945   ALBUMIN  2.2 (L) 05/09/2023 0957   ALBUMIN  4.4 01/20/2020 1053   ALBUMIN  4.5 09/12/2019 1053    Studies/Results: No results found.    Krystal Spinner 05/21/2023  Patient ID: Andrew Tanner, male   DOB: 25-May-1950, 73 y.o.   MRN: [id number]

## 2023-05-21 NOTE — Plan of Care (Signed)

## 2023-05-22 ENCOUNTER — Telehealth: Payer: Self-pay | Admitting: Family Medicine

## 2023-05-22 LAB — GLUCOSE, CAPILLARY
Glucose-Capillary: 186 mg/dL — ABNORMAL HIGH (ref 70–99)
Glucose-Capillary: 173 mg/dL — ABNORMAL HIGH (ref 70–99)
Glucose-Capillary: 185 mg/dL — ABNORMAL HIGH (ref 70–99)
Glucose-Capillary: 177 mg/dL — ABNORMAL HIGH (ref 70–99)

## 2023-05-22 MED ORDER — BOOST / RESOURCE BREEZE PO LIQD CUSTOM
1.0000 | Freq: Three times a day (TID) | ORAL | Status: DC
  Administered 2023-05-22 – 2023-05-28 (×14): 1 via ORAL
  Administered 2023-05-28 – 2023-05-29 (×2): 237 mL via ORAL
  Administered 2023-05-29: 1 via ORAL
  Administered 2023-05-29 – 2023-05-30 (×2): 237 mL via ORAL
  Administered 2023-05-30 – 2023-06-09 (×27): 1 via ORAL

## 2023-05-22 NOTE — Progress Notes (Signed)
 Subjective/Chief Complaint: Doing better.  Pt is a bit stronger.    Objective: Vital signs in last 24 hours: Temp:  [98 F (36.7 C)-98.3 F (36.8 C)] 98.3 F (36.8 C) (02/10 0725) Pulse Rate:  [86-98] 98 (02/10 0725) Resp:  [18-20] 20 (02/10 0725) BP: (130-148)/(76-93) 130/93 (02/10 0725) SpO2:  [98 %-100 %] 100 % (02/10 0725) Last BM Date : 05/22/23  Intake/Output from previous day: 02/09 0701 - 02/10 0700 In: -  Out: 290 [Urine:200; Drains:90] Intake/Output this shift: Total I/O In: 60 [P.O.:60] Out: -   General:  still a bit frail.  Abdomen:  soft non distended, non tender Drain cloudy serosang  Lab Results:  No results for input(s): "WBC", "HGB", "HCT", "PLT" in the last 72 hours.  BMET Recent Labs    05/20/23 0711  CREATININE 0.75   PT/INR No results for input(s): "LABPROT", "INR" in the last 72 hours. ABG No results for input(s): "PHART", "HCO3" in the last 72 hours.  Invalid input(s): "PCO2", "PO2"  Studies/Results: No results found.   Anti-infectives: Anti-infectives (From admission, onward)    Start     Dose/Rate Route Frequency Ordered Stop   05/18/23 0000  ciprofloxacin  (CIPRO ) 500 MG tablet        500 mg Oral 2 times daily 05/18/23 1250     05/18/23 0000  metroNIDAZOLE  (FLAGYL ) 500 MG tablet        500 mg Oral Every 12 hours 05/18/23 1250     05/17/23 2000  ciprofloxacin  (CIPRO ) tablet 500 mg        500 mg Oral 2 times daily 05/17/23 1128 05/20/23 2116   05/17/23 2000  metroNIDAZOLE  (FLAGYL ) tablet 500 mg        500 mg Oral Every 12 hours 05/17/23 1128 05/20/23 2116   05/14/23 0245  ciprofloxacin  (CIPRO ) IVPB 400 mg  Status:  Discontinued        400 mg 200 mL/hr over 60 Minutes Intravenous Every 12 hours 05/14/23 0227 05/17/23 1128   05/14/23 0245  metroNIDAZOLE  (FLAGYL ) IVPB 500 mg  Status:  Discontinued        500 mg 100 mL/hr over 60 Minutes Intravenous Every 12 hours 05/14/23 0227 05/17/23 1128       Assessment/Plan: s/p Whipple  04/27/23 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   Readmitted with colitis and fall.  No apparent injury from fall IV antibiotics for colitis, switched to oral today.  Plan 7 d total, d/w pharmacy.  C diff negative as well as stool pathogen panel. Respiratory panel negative.   PT/OT consult.  TOC consult.  At prior discharge, patient and wife were at odds regarding discharge plan.  I discussed with patient that it did not appear to be safe for home discharge as of last week.   Lovenox  for vte ppx IV fluids for diarrhea/rehydration, off now.  Rehydrated.  Diarrhea improving. Insulin  for DM  Soft diet. Megace  for appetite stimulation and protein calorie malnutrition. Calorie counts.  Drain amylase elevated c/w panc leak.   Await placement. Ok for d/c to SNF/rehab when bed available.  Insurance company denying SNF at this time.   D/c summary signed and meds reconciled.  D/c instructions in place.   FL2 signed.         LOS: 9 days    Andrew Tanner 05/22/2023

## 2023-05-22 NOTE — Progress Notes (Signed)
 Mobility Specialist Progress Note:    05/22/23 1100  Mobility  Activity Ambulated with assistance in hallway  Level of Assistance Contact guard assist, steadying assist  Assistive Device Front wheel walker  Distance Ambulated (ft) 130 ft  Activity Response Tolerated well  Mobility Referral Yes  Mobility visit 1 Mobility  Mobility Specialist Start Time (ACUTE ONLY) 1109  Mobility Specialist Stop Time (ACUTE ONLY) 1118  Mobility Specialist Time Calculation (min) (ACUTE ONLY) 9 min   Pt received in bed, agreeable to mobility. Asymptomatic throughout w/ no complaints. Returned to room w/o fault. Pt left ambulating in room w/ PT.   D'Vante Nolon Baxter Mobility Specialist Please contact via Special educational needs teacher or Rehab office at [phone]

## 2023-05-22 NOTE — Telephone Encounter (Signed)
 Form faxed back to [facility].

## 2023-05-22 NOTE — Telephone Encounter (Signed)
 Copied from CRM [id number]. Topic: General - Other >> May 22, 2023  2:18 PM Whitney O wrote: Reason for CRM: patient wife is calling cause they have a problem . We need her help on a appeal he had surgery on jan. 16th . He is not able to do any activities of daily living .  We were asking for skilled nursing and ask the surgeon and they denied it . He is not able to come home and he had 3 falls and had to call the ems and looking for support and guidance . He was home for 80 hrs and that's when he had the 3 falls and had to call the ems cause he was dehydrated and other things. [phone] Ms. Tanya Fantasia  All the information is downloaded in [place] dr can check that out to get more information.

## 2023-05-22 NOTE — TOC Progression Note (Addendum)
 Transition of Care [facility]) - Progression Note    Patient Details  Name: Andrew Tanner MRN: [id number] Date of Birth: 04-Jul-1950  Transition of Care [facility]) CM/SW Contact  Elspeth Hals, LCSW Phone Number: 05/22/2023, 8:33 AM  Clinical Narrative:   SNF auth remains pending in [place].    68: TC Navi: offering P2P by 1400.  [phone] #5.  CSW spoke with Dr Cherlynn Cornfield office and relayed this information.   1025: message from Dr Cherlynn Cornfield, P2P completed, she was informed request is not meeting their criteria and will  be denied.  1100: TC from pt wife Andrew Tanner requesting update, informed her of the above.  She does want to appeal, does not believe it is safe for pt to DC home due to falls, stairs in the home.  1350: Navi portal has not been updated with any information.  SNF auth request still shows as pending.   1440: pt and wife updated in pt room.  Wife requesting appeal info be emailed: [email or web address]     Expected Discharge Plan: Skilled Nursing Facility Barriers to Discharge: Continued Medical Work up, SNF Pending bed offer  Expected Discharge Plan and Services In-house Referral: Clinical Social Work   Post Acute Care Choice: Home Health Living arrangements for the past 2 months: Single Family Home                                       Social Determinants of Health (SDOH) Interventions SDOH Screenings   Food Insecurity: No Food Insecurity (05/14/2023)  Housing: Low Risk  (05/14/2023)  Transportation Needs: No Transportation Needs (05/14/2023)  Utilities: Not At Risk (05/14/2023)  Alcohol Screen: Low Risk  (10/19/2020)  Depression (PHQ2-9): Low Risk  (03/20/2023)  Financial Resource Strain: Low Risk  (03/16/2023)  Physical Activity: Sufficiently Active (03/16/2023)  Social Connections: Socially Integrated (05/14/2023)  Stress: No Stress Concern Present (03/16/2023)  Tobacco Use: Medium Risk (05/13/2023)  Health Literacy: Adequate Health Literacy (01/03/2023)     Readmission Risk Interventions    05/15/2023    8:24 AM  Readmission Risk Prevention Plan  Transportation Screening Complete  PCP or Specialist Appt within 3-5 Days Complete  HRI or Home Care Consult Complete

## 2023-05-22 NOTE — Plan of Care (Signed)

## 2023-05-22 NOTE — Progress Notes (Signed)
 Physical Therapy Treatment Patient Details Name: Andrew Tanner MRN: [id number] DOB: 02/19/1951 Today's Date: 05/22/2023   History of Present Illness 73yo M admitted 2/1 after fall at home and diarrhea.  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 Cherlynn Cornfield was D/C home 05/10/23. CT scan of the abdomen and pelvis shows diffuse colitis.  He has a small fluid collection near the pancreatico jejunal anastomosis.  PMH: anxiety, DM    PT Comments  Pt seen for PT tx with pt agreeable, received in handoff from mobility specialist. Pt completed Berg Balance Test scoring 25/56; PT educated pt on interpretation of score & current fall risk. Patient demonstrates increased fall risk as noted by score of 25/56 on Berg Balance Scale.  (<36= high risk for falls, close to 100%; 37-45 significant >80%; 46-51 moderate >50%; 52-55 lower >25%). Pt ambulates into hallway with RW & CGA, frequently drifting R despite PT education & cuing to correct; pt with decreased awareness of this. Once back in room pt requires MAX cuing for sequencing to walk to chair, turn, & sit after making sure he's close enough to chair. Pt presents with impaired cognition & balance that put him at a high risk for falls. Pt would benefit from ongoing PT services to address deficits noted below to reduce fall risk & decrease caregiver burden. Continue to recommend PT services in post acute setting <3 hours therapy/day.     If plan is discharge home, recommend the following: A little help with walking and/or transfers;A little help with bathing/dressing/bathroom;Assistance with cooking/housework;Assist for transportation;Help with stairs or ramp for entrance;Supervision due to cognitive status   Can travel by private vehicle     Yes  Equipment Recommendations  None recommended by PT    Recommendations for Other Services Rehab consult     Precautions / Restrictions Precautions Precautions: Fall Precaution Comments: JP drain right  side Restrictions Weight Bearing Restrictions Per Provider Order: No     Mobility  Bed Mobility               General bed mobility comments: not tested, pt received in handoff from mobility specialist, left in recliner at end of session    Transfers Overall transfer level: Needs assistance Equipment used: Rolling walker (2 wheels) Transfers: Sit to/from Stand Sit to Stand: Supervision           General transfer comment: STS with cuing re: hand placement during transfer    Ambulation/Gait   Gait Distance (Feet): 200 Feet Assistive device: Rolling walker (2 wheels) Gait Pattern/deviations: Decreased step length - right, Decreased stride length, Decreased step length - left, Decreased dorsiflexion - right, Decreased dorsiflexion - left Gait velocity: decreased     General Gait Details: Pt with absent heel strike BLE, requires cuing to ambulate within base of RW vs pushing it out in front. Pt consistently drifts R despite PT providing cuing & assistance to correct, pt with decreased awareness of this. Pt requires extra time & assistance to maneuver around objects on R.   Stairs             Wheelchair Mobility     Tilt Bed    Modified Rankin (Stroke Patients Only)       Balance Overall balance assessment: Needs assistance                               Standardized Balance Assessment Standardized Balance Assessment : Berg Balance Test  Berg Balance Test Sit to Stand: Able to stand without using hands and stabilize independently Standing Unsupported: Able to stand 30 seconds unsupported Sitting with Back Unsupported but Feet Supported on Floor or Stool: Able to sit safely and securely 2 minutes Stand to Sit: Sits independently, has uncontrolled descent Transfers: Able to transfer with verbal cueing and /or supervision Standing Unsupported with Eyes Closed: Able to stand 10 seconds with supervision Standing Ubsupported with Feet Together:  Able to place feet together independently but unable to hold for 30 seconds From Standing, Reach Forward with Outstretched Arm: Loses balance while trying/requires external support From Standing Position, Pick up Object from Floor: Unable to pick up shoe, but reaches 2-5 cm (1-2") from shoe and balances independently From Standing Position, Turn to Look Behind Over each Shoulder: Needs supervision when turning Turn 360 Degrees: Needs close supervision or verbal cueing Standing Unsupported, Alternately Place Feet on Step/Stool: Needs assistance to keep from falling or unable to try Standing Unsupported, One Foot in Front: Able to plae foot ahead of the other independently and hold 30 seconds Standing on One Leg: Unable to try or needs assist to prevent fall Total Score: 25        Cognition Arousal: Alert Behavior During Therapy: Flat affect Overall Cognitive Status: Impaired/Different from baseline Area of Impairment: Attention, Memory, Safety/judgement, Following commands, Awareness, Problem solving                     Memory: Decreased recall of precautions, Decreased short-term memory Following Commands: Follows one step commands with increased time, Follows one step commands inconsistently Safety/Judgement: Decreased awareness of safety, Decreased awareness of deficits Awareness: Emergent Problem Solving: Slow processing, Decreased initiation, Difficulty sequencing, Requires verbal cues, Requires tactile cues General Comments: Pt requires max multimodal cuing for proper use of AD when turning, cuing for safety for stand>sit transfers. PT asked pt to scoot back in his seat & pt stands up & attempts to walk instead. Pt with decreased awareness of safety, decreased ability to follow simple commands.        Exercises      General Comments        Pertinent Vitals/Pain Pain Assessment Pain Assessment: Faces Faces Pain Scale: No hurt    Home Living                           Prior Function            PT Goals (current goals can now be found in the care plan section) Acute Rehab PT Goals Patient Stated Goal: 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 PT Goal Formulation: With patient Time For Goal Achievement: 05/29/23 Potential to Achieve Goals: Fair Progress towards PT goals: Progressing toward goals    Frequency    Min 1X/week      PT Plan      Co-evaluation              AM-PAC PT "6 Clicks" Mobility   Outcome Measure  Help needed turning from your back to your side while in a flat bed without using bedrails?: A Little Help needed moving from lying on your back to sitting on the side of a flat bed without using bedrails?: A Little Help needed moving to and from a bed to a chair (including a wheelchair)?: A Little Help needed standing up from a chair using your arms (e.g., wheelchair or bedside chair)?: A Little Help  needed to walk in hospital room?: A Little Help needed climbing 3-5 steps with a railing? : A Little 6 Click Score: 18    End of Session   Activity Tolerance: Patient tolerated treatment well Patient left: in chair;with chair alarm set;with call bell/phone within reach   PT Visit Diagnosis: Unsteadiness on feet (R26.81);Other abnormalities of gait and mobility (R26.89);Muscle weakness (generalized) (M62.81)     Time: 1610-9604 PT Time Calculation (min) (ACUTE ONLY): 26 min  Charges:    $Gait Training: 8-22 mins $Therapeutic Activity: 8-22 mins PT General Charges $$ ACUTE PT VISIT: 1 Visit                     Emaline Handsome, PT, DPT 05/22/23, 11:51 AM   Venetta Gill 05/22/2023, 11:49 AM

## 2023-05-22 NOTE — Telephone Encounter (Signed)
 Received faxed document Home Health Certificate (Order ID [id number]), to be filled out by provider. Patient requested to send it back via Fax . Document is located in providers tray at front office.Please advise .

## 2023-05-23 ENCOUNTER — Ambulatory Visit: Payer: Medicare Other | Admitting: Internal Medicine

## 2023-05-23 ENCOUNTER — Encounter: Payer: Self-pay | Admitting: *Deleted

## 2023-05-23 LAB — GLUCOSE, CAPILLARY
Glucose-Capillary: 232 mg/dL — ABNORMAL HIGH (ref 70–99)
Glucose-Capillary: 226 mg/dL — ABNORMAL HIGH (ref 70–99)
Glucose-Capillary: 222 mg/dL — ABNORMAL HIGH (ref 70–99)
Glucose-Capillary: 197 mg/dL — ABNORMAL HIGH (ref 70–99)
Glucose-Capillary: 225 mg/dL — ABNORMAL HIGH (ref 70–99)
Glucose-Capillary: 227 mg/dL — ABNORMAL HIGH (ref 70–99)

## 2023-05-23 MED ORDER — PANCRELIPASE (LIP-PROT-AMYL) 36000-114000 UNITS PO CPEP
36000.0000 [IU] | ORAL_CAPSULE | ORAL | Status: DC
  Administered 2023-05-24 – 2023-05-31 (×13): 36000 [IU] via ORAL
  Filled 2023-05-23 (×29): qty 1

## 2023-05-23 MED ORDER — ENSURE ENLIVE PO LIQD
237.0000 mL | Freq: Three times a day (TID) | ORAL | Status: DC | PRN
  Administered 2023-05-24: 237 mL via ORAL

## 2023-05-23 MED ORDER — GUAIFENESIN-DM 100-10 MG/5ML PO SYRP
15.0000 mL | ORAL_SOLUTION | Freq: Four times a day (QID) | ORAL | Status: DC | PRN
  Administered 2023-05-23 – 2023-06-06 (×4): 15 mL via ORAL
  Filled 2023-05-23 (×4): qty 20

## 2023-05-23 MED ORDER — ADULT MULTIVITAMIN W/MINERALS CH
1.0000 | ORAL_TABLET | Freq: Every day | ORAL | Status: DC
  Administered 2023-05-23 – 2023-06-09 (×18): 1 via ORAL
  Filled 2023-05-23 (×19): qty 1

## 2023-05-23 NOTE — Progress Notes (Signed)
Subjective/Chief Complaint: SNF denied.  Continues to improve.    Objective: Vital signs in last 24 hours: Temp:  [98.2 F (36.8 C)-98.5 F (36.9 C)] 98.2 F (36.8 C) (02/11 0714) Pulse Rate:  [85] 85 (02/11 0714) Resp:  [18-19] 18 (02/11 0714) BP: (148-154)/(88-94) 154/94 (02/11 0714) SpO2:  [98 %-99 %] 98 % (02/11 0714) Last BM Date : 05/22/23  Intake/Output from previous day: 02/10 0701 - 02/11 0700 In: 60 [P.O.:60] Out: 110 [Drains:110] Intake/Output this shift: Total I/O In: -  Out: 40 [Drains:40]  General:  alert in bed, no distress Abdomen:  soft non distended, non tender, incision c/d/I. Healing well.  Drain cloudy serosang  Lab Results:  No results for input(s): "WBC", "HGB", "HCT", "PLT" in the last 72 hours.  BMET No results for input(s): "NA", "K", "CL", "CO2", "GLUCOSE", "BUN", "CREATININE", "CALCIUM" in the last 72 hours.  PT/INR No results for input(s): "LABPROT", "INR" in the last 72 hours. ABG No results for input(s): "PHART", "HCO3" in the last 72 hours.  Invalid input(s): "PCO2", "PO2"  Studies/Results: No results found.   Anti-infectives: Anti-infectives (From admission, onward)    Start     Dose/Rate Route Frequency Ordered Stop   05/18/23 0000  ciprofloxacin (CIPRO) 500 MG tablet        500 mg Oral 2 times daily 05/18/23 1250     05/18/23 0000  metroNIDAZOLE (FLAGYL) 500 MG tablet        500 mg Oral Every 12 hours 05/18/23 1250     05/17/23 2000  ciprofloxacin (CIPRO) tablet 500 mg        500 mg Oral 2 times daily 05/17/23 1128 05/20/23 2116   05/17/23 2000  metroNIDAZOLE (FLAGYL) tablet 500 mg        500 mg Oral Every 12 hours 05/17/23 1128 05/20/23 2116   05/14/23 0245  ciprofloxacin (CIPRO) IVPB 400 mg  Status:  Discontinued        400 mg 200 mL/hr over 60 Minutes Intravenous Every 12 hours 05/14/23 0227 05/17/23 1128   05/14/23 0245  metroNIDAZOLE (FLAGYL) IVPB 500 mg  Status:  Discontinued        500 mg 100 mL/hr over 60 Minutes  Intravenous Every 12 hours 05/14/23 0227 05/17/23 1128       Assessment/Plan: 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   Readmitted with colitis and fall.  No apparent injury from fall IV antibiotics for colitis, switched to oral. 7 d total C diff negative as well as stool pathogen panel. Respiratory panel negative.   PT/OT consult.  TOC consult.  At prior discharge, patient and wife were at odds regarding discharge plan.  I discussed with patient that it did not appear to be safe for home discharge as of last week. Pt is improving.   Lovenox for vte ppx IV fluids for diarrhea/rehydration, off now.  Rehydrated.  Diarrhea continues to improve with creon. Insulin for DM  Soft diet. Megace for appetite stimulation and protein calorie malnutrition. Calorie counts.  Drain amylase elevated c/w panc leak.   Await placement. Ok for d/c to SNF/rehab when bed available. Will likely have to go home with [facility].  Insurance company denying SNF at this time.   Have asked PT to comment on proficiency with entire flight of stairs.  D/c summary signed and meds reconciled.  D/c instructions in place.   FL2 signed.         LOS: 10 days  [name] 05/23/2023

## 2023-05-23 NOTE — Telephone Encounter (Signed)
Left message to return my call.

## 2023-05-23 NOTE — Telephone Encounter (Signed)
Mrs. [name] notified of message below.

## 2023-05-23 NOTE — Progress Notes (Signed)
Mobility Specialist Progress Note:    05/23/23 1300  Mobility  Activity Ambulated with assistance in hallway  Level of Assistance Contact guard assist, steadying assist  Assistive Device Front wheel walker  Distance Ambulated (ft) 130 ft  Activity Response Tolerated well  Mobility Referral Yes  Mobility visit 1 Mobility  Mobility Specialist Start Time (ACUTE ONLY) 1323  Mobility Specialist Stop Time (ACUTE ONLY) 1340  Mobility Specialist Time Calculation (min) (ACUTE ONLY) 17 min   Pt received in bed and agreeable. Used BSC before ambulating. BM successful. Asymptomatic w/ no complaints throughout ambulation. Pt left in bed with call bell and all needs met. Bed alarm on.  Andrew Tanner Mobility Specialist Please contact via Special educational needs teacher or Rehab office at [phone]

## 2023-05-23 NOTE — Telephone Encounter (Signed)
Copied from CRM [id number]. Topic: General - Other >> May 23, 2023 11:42 AM Adele Barthel wrote: Reason for CRM: Was released from hospital on 04/27/2023 and had: 3 falls and 2 EMS calls in 80 hours and was hospitalized for dehydration and required Whipple procedure on 05/13/2023. Still currently admitted at hospital. Staff is trying to discharge patient home; however, wife does not believe this will be safe for her husband or her.   Unable to take care of self, requires assistance with dressing and eating.  Applied for skilled nursing and had initial denial. Was advised she would need to contact primary provider to handle the denial  Wife is unable to physically take care of him.  Wife feels he requires skilled nursing at a rehabilitation center until he is fit to return home.  Has 2 story house, 14 steps going into the house. No bathroom access downstairs.  Requires use of gait belt and other assistive devices.  Mentally addled, believes it may be due to the anesthesia. ALDs is 0 out of 6.   Please read patient's MyChart messages. Will be asking insurance to send most recent notes.  Wife would like to know how the provider can help with the denial process. She has started the appeal process and asks if provider could review his hospital notes and advise what wife should do to get the skilled nursing put in place. Requests call back. CB# [phone]

## 2023-05-23 NOTE — Progress Notes (Signed)
Nutrition Follow-up  DOCUMENTATION CODES:   Severe malnutrition in context of acute illness/injury  INTERVENTION:  Continue Boost Breeze po TID, each supplement provides 250 kcal and 9 grams of protein Encouraged pt to continue to supplement with Ensure or Boost Plus as able given they provide more protein & calories in the same volume. Continue to order PRN. Continue magic cup BID with meals, each supplement provides 290 kcal and 9 grams of protein MVI with minerals daily Liberalized diet to regular to encourage adequate po intake Spoke with MD regarding changing Creon dosage with snacks to scheduled instead of PRN to ensure pt receives Creon with all food  NUTRITION DIAGNOSIS:   Severe Malnutrition related to poor appetite (malabsorption) as evidenced by moderate fat depletion, severe fat depletion, severe muscle depletion, energy intake < or equal to 50% for > or equal to 5 days, percent weight loss.  Ongoing  GOAL:   Patient will meet greater than or equal to 90% of their needs  Not met, slowly progressing  MONITOR:   PO intake, Supplement acceptance, Weight trends, Labs  REASON FOR ASSESSMENT:   Consult Calorie Count  ASSESSMENT:   Pt admitted after fall at home and diarrhea and found to have diffuse colitis and small fluid collection near the pancreaticojejunal anastomosis. 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 Pt with PMH of DM, HLD, HTN  Pt reports feeling a little better today and says bowel movements have improved. Pt reports he has not had diarrhea today; per chart review he had type 4 BM yesterday, which is a great improvement from the daily type 7 BM's he was having prior. Pt reports he still does not have his full appetite back but is eating what comes on his trays. Per chart review pt is eating 100% of meals, however pt has been ordering chicken noodle soup and applesauce with water.These foods are not calorically dense, therefore he is still not  meeting his energy needs. Pt reports being tired of the taste of Boost and Ensure, so we gave him Boost Breeze as a substitute. Discussed with pt that since he is not meeting his energy needs with his meals, the nutrition supplements are important for preventing further depletions and maintaining current muscle stores. Discussed possible options for supplementation after discharge, including smoothies with protein powder, protein bars, swiss cheese with crackers.  Pt ready for discharge but pt's insurance company denying SNF at this time. Spoke with pt's wife yesterday who used reports not feeling safe to bring him home with just her caring for him. Wife reports she will be calling to try and get the SNF denial appealed.  Spoke with MD who changed Creon dosing regimen to ensure pt is receiving creon dose with snacks. MD was agreeable to liberalizing dir to regular.   If pt's po intake continues to be poor/inadequate with continued decline in nutritional status, recommend considering nutrition support.  Admit weight: 178 lb Current weight: 161 lbs This is a 9.6% weight loss in the last month which is clinically significant for the time frame.  Meds: Boost Breeze TID, Novolog TID 0-15 units, Novolog 0-5 units at bedtime, Creon 72,000 units TID before meals, Megace Protonix, Creon 36,000 BID before snacks  Labs: CBGs range from 173-232 in last 24 hrs, A1C 6.8 (04/21/23)  NUTRITION - FOCUSED PHYSICAL EXAM: Upon reexam, noted areas of worsening depletion.  Flowsheet Row Most Recent Value  Orbital Region Moderate depletion  Upper Arm Region Severe depletion  Thoracic and Lumbar  Region Moderate depletion  Buccal Region Moderate depletion  Temple Region Moderate depletion  Clavicle Bone Region Severe depletion  Clavicle and Acromion Bone Region Severe depletion  Scapular Bone Region Severe depletion  Dorsal Hand Moderate depletion  Patellar Region Severe depletion  Anterior Thigh Region Severe  depletion  Posterior Calf Region Severe depletion  Edema (RD Assessment) None  Hair Reviewed  Eyes Reviewed  Mouth Reviewed  Skin Reviewed  Nails Reviewed       Diet Order:   Diet Order             Diet regular Room service appropriate? Yes; Fluid consistency: Thin  Diet effective now           Diet - low sodium heart healthy                   EDUCATION NEEDS:   Education needs have been addressed  Skin:  Skin Assessment: Reviewed RN Assessment  Last BM:  2/10-Type 4  Height:   Ht Readings from Last 1 Encounters:  05/13/23 5\' 11"  (1.803 m)    Weight:   Wt Readings from Last 1 Encounters:  05/21/23 73 kg    Ideal Body Weight:  78.2 kg  BMI:  Body mass index is 22.45 kg/m.  Estimated Nutritional Needs:   Kcal:  2100-2300  Protein:  105-120 g  Fluid:  >/= 2 L    Maceo Pro, MS Dietetic Intern

## 2023-05-23 NOTE — TOC Progression Note (Signed)
Transition of Care [facility]) - Progression Note    Patient Details  Name: Andrew Tanner MRN: [id number] Date of Birth: 1951/01/05  Transition of Care [facility]) CM/SW Contact  Lorri Frederick, LCSW Phone Number: 05/23/2023, 8:29 AM  Clinical Narrative:   CSW received message from [place] that SNF [facility] has been denied. Fast track appeal at [phone].  CSW sent message with this information to pt wife [name].      Expected Discharge Plan: Skilled Nursing Facility Barriers to Discharge: Continued Medical Work up, SNF Pending bed offer  Expected Discharge Plan and Services In-house Referral: Clinical Social Work   Post Acute Care Choice: Home Health Living arrangements for the past 2 months: Single Family Home                                       Social Determinants of Health (SDOH) Interventions SDOH Screenings   Food Insecurity: No Food Insecurity (05/14/2023)  Housing: Low Risk  (05/14/2023)  Transportation Needs: No Transportation Needs (05/14/2023)  Utilities: Not At Risk (05/14/2023)  Alcohol Screen: Low Risk  (10/19/2020)  Depression (PHQ2-9): Low Risk  (03/20/2023)  Financial Resource Strain: Low Risk  (03/16/2023)  Physical Activity: Sufficiently Active (03/16/2023)  Social Connections: Socially Integrated (05/14/2023)  Stress: No Stress Concern Present (03/16/2023)  Tobacco Use: Medium Risk (05/13/2023)  Health Literacy: Adequate Health Literacy (01/03/2023)    Readmission Risk Interventions    05/15/2023    8:24 AM  Readmission Risk Prevention Plan  Transportation Screening Complete  PCP or Specialist Appt within 3-5 Days Complete  HRI or Home Care Consult Complete

## 2023-05-23 NOTE — Progress Notes (Signed)
Mobility Specialist Progress Note:    05/23/23 1000  Mobility  Activity Transferred from bed to chair  Level of Assistance Contact guard assist, steadying assist  Assistive Device Front wheel walker  Distance Ambulated (ft) 4 ft  Activity Response Tolerated well  Mobility Referral Yes  Mobility visit 1 Mobility  Mobility Specialist Start Time (ACUTE ONLY) [id number]  Mobility Specialist Stop Time (ACUTE ONLY) 0954  Mobility Specialist Time Calculation (min) (ACUTE ONLY) 3 min   Pt received in bed, requesting assistance to chair. No complaints throughout. Pt left in chair with call bell and all needs met. Family present and chair alarm on.  D'Vante Earlene Plater Mobility Specialist Please contact via Special educational needs teacher or Rehab office at [phone]

## 2023-05-24 LAB — GLUCOSE, CAPILLARY
Glucose-Capillary: 260 mg/dL — ABNORMAL HIGH (ref 70–99)
Glucose-Capillary: 248 mg/dL — ABNORMAL HIGH (ref 70–99)
Glucose-Capillary: 277 mg/dL — ABNORMAL HIGH (ref 70–99)
Glucose-Capillary: 168 mg/dL — ABNORMAL HIGH (ref 70–99)

## 2023-05-24 NOTE — Progress Notes (Signed)
Physical Therapy Treatment Patient Details Name: Andrew Tanner MRN: [id number] DOB: 12/31/1950 Today's Date: 05/24/2023   History of Present Illness 72yo M admitted 2/1 after fall at home and diarrhea.  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 CT scan of the abdomen and pelvis shows diffuse colitis.  He has a small fluid collection near the pancreatico jejunal anastomosis.  PMH: anxiety, DM    PT Comments  Pt agreeable to participate in therapy session. Displays decreased insight into deficits and carryover of safety cueing. Pt requiring up to min assist for balance with hallway ambulation using RW. Ascended/descended 12 steps with left railing to simulate home set up. Pt utilizing bilateral hand support on rail and requiring CGA for safety. Continues to present as a high fall risk based on recurrent falls, decreased safety awareness and decreased gait speed. Recommend follow up PT to address and supervision for all mobility and ADL's.    If plan is discharge home, recommend the following: A little help with walking and/or transfers;A little help with bathing/dressing/bathroom;Assistance with cooking/housework;Assist for transportation;Help with stairs or ramp for entrance;Supervision due to cognitive status   Can travel by private vehicle     Yes  Equipment Recommendations  None recommended by PT    Recommendations for Other Services       Precautions / Restrictions Precautions Precautions: Fall Precaution/Restrictions Comments: JP drain right side Restrictions Weight Bearing Restrictions Per Provider Order: No     Mobility  Bed Mobility Overal bed mobility: Modified Independent                  Transfers Overall transfer level: Needs assistance Equipment used: Rolling walker (2 wheels) Transfers: Sit to/from Stand Sit to Stand: Supervision           General transfer comment: Pushes up with BUE's to stand     Ambulation/Gait Ambulation/Gait assistance: Min assist Gait Distance (Feet): 75 Feet (75", 75", 75") Assistive device: Rolling walker (2 wheels) Gait Pattern/deviations: Step-through pattern, Decreased dorsiflexion - right, Decreased dorsiflexion - left Gait velocity: decreased Gait velocity interpretation: <1.8 ft/sec, indicate of risk for recurrent falls   General Gait Details: Pt with one posterior LOB requiring minA to correct, and requires increased assist after negotiating stairs due to fatigue and anterior lean. Decreased bilateral foot clearance and heel strike at initial contact   Stairs Stairs: Yes Stairs assistance: Contact guard assist Stair Management: One rail Left Number of Stairs: 12 General stair comments: Cues for holding on with both hands for increased external support and keeping foot fully on step. Pt ascending step over step and descending step by step   Wheelchair Mobility     Tilt Bed    Modified Rankin (Stroke Patients Only)       Balance Overall balance assessment: Needs assistance Sitting-balance support: Feet supported Sitting balance-Leahy Scale: Good     Standing balance support: Bilateral upper extremity supported, During functional activity Standing balance-Leahy Scale: Poor                              Communication Communication Communication: No apparent difficulties  Cognition Arousal: Alert Behavior During Therapy: Flat affect                           PT - Cognition Comments: Decreased recall and carryover in addition to awareness of deficits Following commands: Intact  Cueing Cueing Techniques: Verbal cues  Exercises Other Exercises Other Exercises: Obstacle negotiation with walker stepping up and over frame, platform walker, weaving in and around objects    General Comments        Pertinent Vitals/Pain Pain Assessment Pain Assessment: Faces Faces Pain Scale: No hurt    Home Living                           Prior Function            PT Goals (current goals can now be found in the care plan section) Acute Rehab PT Goals Potential to Achieve Goals: Fair Progress towards PT goals: Progressing toward goals    Frequency    Min 1X/week      PT Plan      Co-evaluation              AM-PAC PT "6 Clicks" Mobility   Outcome Measure  Help needed turning from your back to your side while in a flat bed without using bedrails?: None Help needed moving from lying on your back to sitting on the side of a flat bed without using bedrails?: None Help needed moving to and from a bed to a chair (including a wheelchair)?: A Little Help needed standing up from a chair using your arms (e.g., wheelchair or bedside chair)?: A Little Help needed to walk in hospital room?: A Little Help needed climbing 3-5 steps with a railing? : A Little 6 Click Score: 20    End of Session Equipment Utilized During Treatment: Gait belt Activity Tolerance: Patient tolerated treatment well Patient left: in chair;with chair alarm set;with call bell/phone within reach Nurse Communication: Mobility status PT Visit Diagnosis: Unsteadiness on feet (R26.81);Other abnormalities of gait and mobility (R26.89);Muscle weakness (generalized) (M62.81)     Time: 1610-9604 PT Time Calculation (min) (ACUTE ONLY): 32 min  Charges:    $Therapeutic Activity: 23-37 mins PT General Charges $$ ACUTE PT VISIT: 1 Visit                     Lillia Pauls, PT, DPT Acute Rehabilitation Services Office [phone]    Norval Morton 05/24/2023, 10:18 AM

## 2023-05-24 NOTE — Progress Notes (Signed)
Occupational Therapy Treatment Patient Details Name: Andrew Tanner MRN: [id number] DOB: Nov 21, 1950 Today's Date: 05/24/2023   History of present illness 72yo M admitted 2/1 after fall at home and diarrhea.  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 CT scan of the abdomen and pelvis shows diffuse colitis.  He has a small fluid collection near the pancreatico jejunal anastomosis.  PMH: anxiety, DM   OT comments  Patient demonstrating good gains with bed mobility, toilet transfers, and self care standing at sink. Patient continues to be possible fall risk due to poor safety awareness and fatigues while standing. Patient will benefit from continued inpatient follow up therapy, <3 hours/day. Acute OT to continue to follow to address established goals to facilitate DC to next venue of care.       If plan is discharge home, recommend the following:  A little help with walking and/or transfers;A little help with bathing/dressing/bathroom;Assistance with cooking/housework;Direct supervision/assist for medications management;Direct supervision/assist for financial management;Assist for transportation;Help with stairs or ramp for entrance;Supervision due to cognitive status   Equipment Recommendations  Other (comment) (defer)    Recommendations for Other Services      Precautions / Restrictions Precautions Precautions: Fall Precaution/Restrictions Comments: JP drain right side Restrictions Weight Bearing Restrictions Per Provider Order: No       Mobility Bed Mobility Overal bed mobility: Modified Independent Bed Mobility: Supine to Sit, Sit to Supine           General bed mobility comments: no physical assitance to go supine to sit and sit to supine    Transfers Overall transfer level: Needs assistance Equipment used: Rolling walker (2 wheels) Transfers: Sit to/from Stand Sit to Stand: Contact guard assist           General transfer  comment: cues for hand placement and CGA for safety with toilet transfers     Balance Overall balance assessment: Needs assistance Sitting-balance support: Feet supported Sitting balance-Leahy Scale: Good Sitting balance - Comments: EOB   Standing balance support: Single extremity supported, Bilateral upper extremity supported, During functional activity Standing balance-Leahy Scale: Poor Standing balance comment: able to stand with single extremity support standing at sink                           ADL either performed or assessed with clinical judgement   ADL Overall ADL's : Needs assistance/impaired     Grooming: Wash/dry hands;Wash/dry face;Oral care;Contact guard assist;Standing Grooming Details (indicate cue type and reason): standing at sink                 Toilet Transfer: Contact guard assist;Ambulation;Rolling walker (2 wheels) Toilet Transfer Details (indicate cue type and reason): cues for hand placement Toileting- Clothing Manipulation and Hygiene: Contact guard assist;Sit to/from stand              Extremity/Trunk Assessment Upper Extremity Assessment Upper Extremity Assessment: Generalized weakness            Vision       Perception     Praxis     Communication Communication Communication: No apparent difficulties   Cognition Arousal: Alert Behavior During Therapy: Flat affect                                 Following commands: Intact        Cueing   Cueing  Techniques: Verbal cues  Exercises      Shoulder Instructions       General Comments      Pertinent Vitals/ Pain       Pain Assessment Pain Assessment: 0-10 Pain Score: 3  Pain Location: at JP drain Pain Descriptors / Indicators: Sore Pain Intervention(s): Monitored during session, Repositioned, Patient requesting pain meds-RN notified  Home Living                                          Prior Functioning/Environment               Frequency  Min 1X/week        Progress Toward Goals  OT Goals(current goals can now be found in the care plan section)  Progress towards OT goals: Progressing toward goals  Acute Rehab OT Goals Patient Stated Goal: get stronger OT Goal Formulation: With patient Time For Goal Achievement: 05/29/23 Potential to Achieve Goals: Good ADL Goals Pt Will Perform Grooming: with modified independence;sitting;standing Pt Will Perform Upper Body Dressing: with modified independence;sitting Pt Will Perform Lower Body Dressing: with modified independence;sit to/from stand Pt Will Transfer to Toilet: with modified independence;bedside commode;ambulating Pt Will Perform Toileting - Clothing Manipulation and hygiene: with modified independence;sitting/lateral leans;sit to/from stand Additional ADL Goal #1: Pt will complete pill box test with less than 3 errors.  Plan      Co-evaluation                 AM-PAC OT "6 Clicks" Daily Activity     Outcome Measure   Help from another person eating meals?: A Little Help from another person taking care of personal grooming?: A Little Help from another person toileting, which includes using toliet, bedpan, or urinal?: A Little Help from another person bathing (including washing, rinsing, drying)?: A Little Help from another person to put on and taking off regular upper body clothing?: A Little Help from another person to put on and taking off regular lower body clothing?: A Little 6 Click Score: 18    End of Session Equipment Utilized During Treatment: Rolling walker (2 wheels)  OT Visit Diagnosis: Unsteadiness on feet (R26.81);Muscle weakness (generalized) (M62.81);Other symptoms and signs involving cognitive function   Activity Tolerance Patient tolerated treatment well   Patient Left in bed;with call bell/phone within reach;with bed alarm set   Nurse Communication Mobility status;Patient requests pain meds         Time: 5284-1324 OT Time Calculation (min): 28 min  Charges: OT General Charges $OT Visit: 1 Visit OT Treatments $Self Care/Home Management : 8-22 mins $Therapeutic Activity: 8-22 mins  Alfonse Flavors, OTA Acute Rehabilitation Services  Office [phone]   Dewain Penning 05/24/2023, 10:55 AM

## 2023-05-24 NOTE — Progress Notes (Signed)
Mobility Specialist Progress Note:    05/24/23 1500  Mobility  Activity Ambulated with assistance in hallway  Level of Assistance Contact guard assist, steadying assist  Assistive Device Front wheel walker  Distance Ambulated (ft) 130 ft  Activity Response Tolerated well  Mobility Referral Yes  Mobility visit 1 Mobility  Mobility Specialist Start Time (ACUTE ONLY) 1441  Mobility Specialist Stop Time (ACUTE ONLY) 1458  Mobility Specialist Time Calculation (min) (ACUTE ONLY) 17 min   Pt received in bed and agreeable. C/o some back & bottom soreness from bed. Took x1 seated rest break d/t fatigue. Returned to room w/o fault. Pt left in bed with call bell and all needs met. Bed alarm on.  Andrew Tanner Mobility Specialist Please contact via Special educational needs teacher or Rehab office at [phone]

## 2023-05-24 NOTE — TOC Progression Note (Signed)
Transition of Care [facility]) - Progression Note    Patient Details  Name: Andrew Tanner MRN: [id number] Date of Birth: May 29, 1950  [facility]) CM/SW Contact  Lorri Frederick, LCSW Phone Number: 05/24/2023, 12:47 PM  Clinical Narrative:   Appointment of representative form faxed to [employer] at wife request.  Wife is going to call the appeal number with pt in room and see if they can initiate the appeal that way as well.      Expected Discharge Plan: Skilled Nursing Facility Barriers to Discharge: Continued Medical Work up, SNF Pending bed offer  Expected Discharge Plan and Services In-house Referral: Clinical Social Work   Post Acute Care Choice: Home Health Living arrangements for the past 2 months: Single Family Home                                       Social Determinants of Health (SDOH) Interventions SDOH Screenings   Food Insecurity: No Food Insecurity (05/14/2023)  Housing: Low Risk  (05/14/2023)  Transportation Needs: No Transportation Needs (05/14/2023)  Utilities: Not At Risk (05/14/2023)  Alcohol Screen: Low Risk  (10/19/2020)  Depression (PHQ2-9): Low Risk  (03/20/2023)  Financial Resource Strain: Low Risk  (03/16/2023)  Physical Activity: Sufficiently Active (03/16/2023)  Social Connections: Socially Integrated (05/14/2023)  Stress: No Stress Concern Present (03/16/2023)  Tobacco Use: Medium Risk (05/13/2023)  Health Literacy: Adequate Health Literacy (01/03/2023)    Readmission Risk Interventions    05/15/2023    8:24 AM  Readmission Risk Prevention Plan  Transportation Screening Complete  PCP or Specialist Appt within 3-5 Days Complete  HRI or Home Care Consult Complete

## 2023-05-25 LAB — GLUCOSE, CAPILLARY
Glucose-Capillary: 208 mg/dL — ABNORMAL HIGH (ref 70–99)
Glucose-Capillary: 234 mg/dL — ABNORMAL HIGH (ref 70–99)
Glucose-Capillary: 223 mg/dL — ABNORMAL HIGH (ref 70–99)
Glucose-Capillary: 259 mg/dL — ABNORMAL HIGH (ref 70–99)

## 2023-05-25 NOTE — Progress Notes (Signed)
Physical Therapy Treatment Patient Details Name: Andrew Tanner MRN: [id number] DOB: 06/08/1950 Today's Date: 05/25/2023   History of Present Illness 73yo M admitted 2/1 after fall at home and diarrhea.  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 CT scan of the abdomen and pelvis shows diffuse colitis.  He has a small fluid collection near the pancreatico jejunal anastomosis.  PMH: anxiety, DM    PT Comments  Patient willing to participate with therapy. Today's session focused on improving functional strength needed for stair negotiation and balance. Under supervision, patient able to transition himself EOB without any assistance. Required CGA to ensure safety for sit > stand transfer and during ambulation. Patient demonstrated the ability to utilize bilateral and LE support to ascend from seated position in bed. Verbal cues provided for hand placement for the above transfer. Patient able to ambulate 147ft within the unit with verbal cues provided to maintain safe ambulation when using a RW. At some points during the session, patient bumped into obstacles with his RW and required cueing to maneuver around them. Patient performed therapeutic activity and balance exercises needed in order to improve functional strength for mobility, static standing, and stairs. Acute care PT will continue to follow up to address the patient's remaining impairments.    If plan is discharge home, recommend the following: A little help with walking and/or transfers;A little help with bathing/dressing/bathroom;Assistance with cooking/housework;Assist for transportation;Help with stairs or ramp for entrance;Supervision due to cognitive status   Can travel by private vehicle     Yes  Equipment Recommendations  None recommended by PT    Recommendations for Other Services       Precautions / Restrictions Precautions Precautions: Fall Restrictions Weight Bearing Restrictions Per  Provider Order: No     Mobility  Bed Mobility Overal bed mobility: Needs Assistance Bed Mobility: Supine to Sit, Sit to Supine     Supine to sit: Supervision Sit to supine: Supervision   General bed mobility comments: no physical assitance to go supine to sit and sit to supine    Transfers Overall transfer level: Needs assistance Equipment used: Rolling walker (2 wheels) Transfers: Sit to/from Stand Sit to Stand: Contact guard assist           General transfer comment: Cues given for hand placement when transitioning from sit > stand    Ambulation/Gait Ambulation/Gait assistance: Contact guard assist Gait Distance (Feet): 100 Feet Assistive device: Rolling walker (2 wheels) Gait Pattern/deviations: Step-through pattern, Decreased dorsiflexion - right, Decreased dorsiflexion - left       General Gait Details: Cues given to avoid having RW too far out in front   Stairs             Wheelchair Mobility     Tilt Bed    Modified Rankin (Stroke Patients Only)       Balance Overall balance assessment: Needs assistance Sitting-balance support: Feet supported Sitting balance-Leahy Scale: Good Sitting balance - Comments: EOB Postural control: Posterior lean Standing balance support: Bilateral upper extremity supported, During functional activity Standing balance-Leahy Scale: Poor Standing balance comment: Unsteadiness w/o AD                            Communication Communication Communication: No apparent difficulties  Cognition Arousal: Alert Behavior During Therapy: Flat affect   PT - Cognitive impairments: Safety/Judgement  PT - Cognition Comments: Decreased recall and carryover in addition to awareness of deficits Following commands: Intact      Cueing Cueing Techniques: Verbal cues, Tactile cues, Gestural cues  Exercises Other Exercises Other Exercises: Balance 3x10s ea: EO/EC - both narrow base of  support. No UE support Other Exercises: Side Steps 2laps ea direction Other Exercises: Step Ups x5 bilateral LEs    General Comments General comments (skin integrity, edema, etc.): Reports dizziness furing balance exercise, reports improvement following interventions      Pertinent Vitals/Pain Pain Assessment Pain Assessment: No/denies pain    Home Living                          Prior Function            PT Goals (current goals can now be found in the care plan section) Acute Rehab PT Goals Patient Stated Goal: 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 PT Goal Formulation: With patient Time For Goal Achievement: 05/29/23 Potential to Achieve Goals: Good Progress towards PT goals: Progressing toward goals    Frequency    Min 1X/week      PT Plan      Co-evaluation              AM-PAC PT "6 Clicks" Mobility   Outcome Measure  Help needed turning from your back to your side while in a flat bed without using bedrails?: None Help needed moving from lying on your back to sitting on the side of a flat bed without using bedrails?: None Help needed moving to and from a bed to a chair (including a wheelchair)?: A Little Help needed standing up from a chair using your arms (e.g., wheelchair or bedside chair)?: A Little Help needed to walk in hospital room?: A Little Help needed climbing 3-5 steps with a railing? : A Little 6 Click Score: 20    End of Session Equipment Utilized During Treatment: Gait belt Activity Tolerance: Patient tolerated treatment well Patient left: in bed;with bed alarm set;with call bell/phone within reach   PT Visit Diagnosis: Unsteadiness on feet (R26.81);Other abnormalities of gait and mobility (R26.89);Muscle weakness (generalized) (M62.81)     Time: 8413-2440 PT Time Calculation (min) (ACUTE ONLY): 22 min  Charges:    $Therapeutic Activity: 8-22 mins PT General Charges $$ ACUTE PT VISIT: 1 Visit                      Doreen Beam, SPT   Felicity Penix 05/25/2023, 1:33 PM

## 2023-05-25 NOTE — TOC Progression Note (Addendum)
Transition of Care [facility]) - Progression Note    Patient Details  Name: Andrew Tanner MRN: [id number] Date of Birth: 09-30-1950  Transition of Care [facility]) CM/SW Contact  Lorri Frederick, LCSW Phone Number: 05/25/2023, 9:56 AM  Clinical Narrative:   CSW received request from [employer] appeals for clinical information related to pt appeal.  PT/OT/mobility notes faxed to [phone].  Message left with [employer] at [phone].    1320: TC [employer]: did not receive the fax.  Info refaxed, also emailed to [email or web address]  Wife updated by phone  Expected Discharge Plan: Skilled Nursing Facility Barriers to Discharge: Continued Medical Work up, SNF Pending bed offer  Expected Discharge Plan and Services In-house Referral: Clinical Social Work   Post Acute Care Choice: Home Health Living arrangements for the past 2 months: Single Family Home                                       Social Determinants of Health (SDOH) Interventions SDOH Screenings   Food Insecurity: No Food Insecurity (05/14/2023)  Housing: Low Risk  (05/14/2023)  Transportation Needs: No Transportation Needs (05/14/2023)  Utilities: Not At Risk (05/14/2023)  Alcohol Screen: Low Risk  (10/19/2020)  Depression (PHQ2-9): Low Risk  (03/20/2023)  Financial Resource Strain: Low Risk  (03/16/2023)  Physical Activity: Sufficiently Active (03/16/2023)  Social Connections: Socially Integrated (05/14/2023)  Stress: No Stress Concern Present (03/16/2023)  Tobacco Use: Medium Risk (05/13/2023)  Health Literacy: Adequate Health Literacy (01/03/2023)    Readmission Risk Interventions    05/15/2023    8:24 AM  Readmission Risk Prevention Plan  Transportation Screening Complete  PCP or Specialist Appt within 3-5 Days Complete  HRI or Home Care Consult Complete

## 2023-05-25 NOTE — Progress Notes (Signed)
Subjective/Chief Complaint: Worked with steps yesterday. Was quite tired out and unsteady after doing a flight.    Objective: Vital signs in last 24 hours: Temp:  [97.5 F (36.4 C)-98.6 F (37 C)] 98.3 F (36.8 C) (02/13 1348) Pulse Rate:  [91-93] 93 (02/13 1007) Resp:  [17-18] 18 (02/13 1348) BP: (130-156)/(81-103) 156/103 (02/13 1348) SpO2:  [98 %-100 %] 100 % (02/13 1348) Last BM Date : 05/24/23  Intake/Output from previous day: 02/12 0701 - 02/13 0700 In: -  Out: 90 [Drains:90] Intake/Output this shift: No intake/output data recorded.   General:  sleeping, easy to awaken Abdomen:  soft non distended, non tender, incision c/d/I. Healing well.  Drain cloudy serosang  Lab Results:  No results for input(s): "WBC", "HGB", "HCT", "PLT" in the last 72 hours.  BMET No results for input(s): "NA", "K", "CL", "CO2", "GLUCOSE", "BUN", "CREATININE", "CALCIUM" in the last 72 hours.  PT/INR No results for input(s): "LABPROT", "INR" in the last 72 hours. ABG No results for input(s): "PHART", "HCO3" in the last 72 hours.  Invalid input(s): "PCO2", "PO2"  Studies/Results: No results found.   Anti-infectives: Anti-infectives (From admission, onward)    Start     Dose/Rate Route Frequency Ordered Stop   05/18/23 0000  ciprofloxacin (CIPRO) 500 MG tablet        500 mg Oral 2 times daily 05/18/23 1250     05/18/23 0000  metroNIDAZOLE (FLAGYL) 500 MG tablet        500 mg Oral Every 12 hours 05/18/23 1250     05/17/23 2000  ciprofloxacin (CIPRO) tablet 500 mg        500 mg Oral 2 times daily 05/17/23 1128 05/20/23 2116   05/17/23 2000  metroNIDAZOLE (FLAGYL) tablet 500 mg        500 mg Oral Every 12 hours 05/17/23 1128 05/20/23 2116   05/14/23 0245  ciprofloxacin (CIPRO) IVPB 400 mg  Status:  Discontinued        400 mg 200 mL/hr over 60 Minutes Intravenous Every 12 hours 05/14/23 0227 05/17/23 1128   05/14/23 0245  metroNIDAZOLE (FLAGYL) IVPB 500 mg  Status:  Discontinued         500 mg 100 mL/hr over 60 Minutes Intravenous Every 12 hours 05/14/23 0227 05/17/23 1128       Assessment/Plan: 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   Readmitted with colitis and fall.  No apparent injury from fall IV antibiotics for colitis, switched to oral. 7 d total C diff negative as well as stool pathogen panel. Respiratory panel negative.   PT/OT consult.  TOC consult.  Needs to work with stairs more with PT> hope for home early next week if stronger and can safely maneuver at home.   Lovenox for vte ppx IV fluids for diarrhea/rehydration, off now.  Rehydrated.  Diarrhea reportedly resolved with creon. Insulin for DM  Soft diet. Megace for appetite stimulation and protein calorie malnutrition.   Drain amylase elevated c/w panc leak.   Will have to go home with [facility]. Insurance company denying SNF at this time.   Needs continued work with PT on stairs.         LOS: 12 days    Andrew Tanner 05/25/2023

## 2023-05-26 LAB — GLUCOSE, CAPILLARY
Glucose-Capillary: 232 mg/dL — ABNORMAL HIGH (ref 70–99)
Glucose-Capillary: 239 mg/dL — ABNORMAL HIGH (ref 70–99)
Glucose-Capillary: 191 mg/dL — ABNORMAL HIGH (ref 70–99)
Glucose-Capillary: 247 mg/dL — ABNORMAL HIGH (ref 70–99)

## 2023-05-26 MED ORDER — INSULIN ASPART 100 UNIT/ML IJ SOLN
0.0000 [IU] | Freq: Three times a day (TID) | INTRAMUSCULAR | Status: DC
  Administered 2023-05-26: 4 [IU] via SUBCUTANEOUS
  Administered 2023-05-26 – 2023-05-27 (×2): 7 [IU] via SUBCUTANEOUS
  Administered 2023-05-27 (×2): 4 [IU] via SUBCUTANEOUS
  Administered 2023-05-28: 7 [IU] via SUBCUTANEOUS
  Administered 2023-05-28: 15 [IU] via SUBCUTANEOUS
  Administered 2023-05-28: 7 [IU] via SUBCUTANEOUS
  Administered 2023-05-29: 4 [IU] via SUBCUTANEOUS
  Administered 2023-05-29: 15 [IU] via SUBCUTANEOUS
  Administered 2023-05-29: 4 [IU] via SUBCUTANEOUS
  Administered 2023-05-30: 7 [IU] via SUBCUTANEOUS
  Administered 2023-05-30: 20 [IU] via SUBCUTANEOUS
  Administered 2023-05-30 – 2023-05-31 (×2): 7 [IU] via SUBCUTANEOUS

## 2023-05-26 NOTE — Progress Notes (Signed)
Subjective/Chief Complaint: Continues working with PT.  Has low appetite but improving.    Objective: Vital signs in last 24 hours: Temp:  [98.2 F (36.8 C)-98.7 F (37.1 C)] 98.2 F (36.8 C) (02/14 0419) Pulse Rate:  [93-96] 94 (02/14 0419) Resp:  [16-18] 16 (02/14 0419) BP: (133-156)/(81-103) 133/88 (02/14 0419) SpO2:  [98 %-100 %] 98 % (02/14 0419) Weight:  [66.6 kg] 66.6 kg (02/14 0419) Last BM Date : 05/25/23  Intake/Output from previous day: 02/13 0701 - 02/14 0700 In: -  Out: 380 [Drains:130] Intake/Output this shift: No intake/output data recorded.  General:  alert and oriented.  Abdomen:  soft non distended, non tender Drain cloudy serosang  Lab Results:  No results for input(s): "WBC", "HGB", "HCT", "PLT" in the last 72 hours.  BMET No results for input(s): "NA", "K", "CL", "CO2", "GLUCOSE", "BUN", "CREATININE", "CALCIUM" in the last 72 hours.  PT/INR No results for input(s): "LABPROT", "INR" in the last 72 hours. ABG No results for input(s): "PHART", "HCO3" in the last 72 hours.  Invalid input(s): "PCO2", "PO2"  Studies/Results: No results found.   Anti-infectives: Anti-infectives (From admission, onward)    Start     Dose/Rate Route Frequency Ordered Stop   05/18/23 0000  ciprofloxacin (CIPRO) 500 MG tablet        500 mg Oral 2 times daily 05/18/23 1250     05/18/23 0000  metroNIDAZOLE (FLAGYL) 500 MG tablet        500 mg Oral Every 12 hours 05/18/23 1250     05/17/23 2000  ciprofloxacin (CIPRO) tablet 500 mg        500 mg Oral 2 times daily 05/17/23 1128 05/20/23 2116   05/17/23 2000  metroNIDAZOLE (FLAGYL) tablet 500 mg        500 mg Oral Every 12 hours 05/17/23 1128 05/20/23 2116   05/14/23 0245  ciprofloxacin (CIPRO) IVPB 400 mg  Status:  Discontinued        400 mg 200 mL/hr over 60 Minutes Intravenous Every 12 hours 05/14/23 0227 05/17/23 1128   05/14/23 0245  metroNIDAZOLE (FLAGYL) IVPB 500 mg  Status:  Discontinued        500 mg 100  mL/hr over 60 Minutes Intravenous Every 12 hours 05/14/23 0227 05/17/23 1128       Assessment/Plan: 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   Readmitted with colitis and fall.  No apparent injury from fall IV antibiotics for colitis, switched to oral. 7 d total C diff negative as well as stool pathogen panel. Respiratory panel negative.   PT/OT consult.  TOC consult.  Needs to work with stairs more with PT> hope for home early next week if stronger and can safely maneuver at home.  Was still needing help   Lovenox for vte ppx IV fluids for diarrhea/rehydration, off now.  Rehydrated.  Diarrhea reportedly resolved with creon. Insulin for DM  Soft diet. Megace for appetite stimulation and protein calorie malnutrition.   Drain amylase elevated c/w panc leak. Plan d/c with drain.   Insurance company denying SNF at this time. Wife is appealing.  This is reasonable because he is still unsteady on stairs.    Needs continued work with PT on stairs.         LOS: 13 days    [name] 05/26/2023

## 2023-05-26 NOTE — Progress Notes (Signed)
Mobility Specialist Progress Note:    05/26/23 1100  Mobility  Activity Ambulated with assistance to bathroom  Level of Assistance Contact guard assist, steadying assist  Assistive Device Front wheel walker  Distance Ambulated (ft) 40 ft  Activity Response Tolerated well  Mobility Referral Yes  Mobility visit 1 Mobility  Mobility Specialist Start Time (ACUTE ONLY) 1038  Mobility Specialist Stop Time (ACUTE ONLY) 1046  Mobility Specialist Time Calculation (min) (ACUTE ONLY) 8 min   Pt received in bed, requesting assistance to BR. BM successful, and MS assisted in peri care. No complaints throughout. Pt left in bed with call bell and bed alarm on.  D'Vante Earlene Plater Mobility Specialist Please contact via Special educational needs teacher or Rehab office at [phone]

## 2023-05-26 NOTE — Progress Notes (Signed)
Occupational Therapy Treatment Patient Details Name: Andrew Tanner MRN: [id number] DOB: 05/15/1950 Today's Date: 05/26/2023   History of present illness 72yo M admitted 2/1 after fall at home and diarrhea.  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 CT scan of the abdomen and pelvis shows diffuse colitis.  He has a small fluid collection near the pancreatico jejunal anastomosis.  PMH: anxiety, DM   OT comments  Patient continues to demonstrate good gains with OT treatment with bed mobility, toilet transfers, and self care standing at sink. Patient asked to ambulate in hallway and required CGA and demonstrated fatigue following and asked to return to bed. Acute OT to continue to follow to address established goals to facilitate DC to next venue of care.       If plan is discharge home, recommend the following:  A little help with walking and/or transfers;A little help with bathing/dressing/bathroom;Assistance with cooking/housework;Direct supervision/assist for medications management;Direct supervision/assist for financial management;Assist for transportation;Help with stairs or ramp for entrance;Supervision due to cognitive status   Equipment Recommendations  Other (comment) (defer)    Recommendations for Other Services      Precautions / Restrictions Precautions Precautions: Fall Precaution/Restrictions Comments: JP drain right side Restrictions Weight Bearing Restrictions Per Provider Order: No       Mobility Bed Mobility Overal bed mobility: Needs Assistance Bed Mobility: Supine to Sit, Sit to Supine     Supine to sit: Supervision Sit to supine: Supervision   General bed mobility comments: no physical assistance needed to get in/out of bed    Transfers Overall transfer level: Needs assistance Equipment used: Rolling walker (2 wheels) Transfers: Sit to/from Stand Sit to Stand: Contact guard assist           General transfer  comment: frequent cues for hand placement and CGA     Balance Overall balance assessment: Needs assistance Sitting-balance support: Feet supported Sitting balance-Leahy Scale: Good Sitting balance - Comments: EOB   Standing balance support: Single extremity supported, Bilateral upper extremity supported, During functional activity Standing balance-Leahy Scale: Poor Standing balance comment: able to stand at sink with one extremity support during self care tasks                           ADL either performed or assessed with clinical judgement   ADL Overall ADL's : Needs assistance/impaired     Grooming: Wash/dry hands;Wash/dry face;Oral care;Contact guard assist;Standing Grooming Details (indicate cue type and reason): standing at sink     Lower Body Bathing: Minimal assistance Lower Body Bathing Details (indicate cue type and reason): to complete peri area cleaning Upper Body Dressing : Sitting;Set up Upper Body Dressing Details (indicate cue type and reason): to change gowns Lower Body Dressing: Contact guard assist;Sit to/from stand Lower Body Dressing Details (indicate cue type and reason): to change socks Toilet Transfer: Contact guard assist;Ambulation;Rolling walker (2 wheels) Toilet Transfer Details (indicate cue type and reason): cues for hand placement           General ADL Comments: cues for hand placement with transfers    Extremity/Trunk Assessment Upper Extremity Assessment Upper Extremity Assessment: Generalized weakness            Vision       Perception     Praxis     Communication Communication Communication: No apparent difficulties   Cognition Arousal: Alert Behavior During Therapy: Flat affect  Following commands: Intact        Cueing   Cueing Techniques: Verbal cues, Tactile cues, Gestural cues  Exercises      Shoulder Instructions       General Comments       Pertinent Vitals/ Pain       Pain Assessment Pain Assessment: Faces Faces Pain Scale: Hurts a little bit Pain Location: at JP drain Pain Descriptors / Indicators: Sore Pain Intervention(s): Monitored during session  Home Living                                          Prior Functioning/Environment              Frequency  Min 1X/week        Progress Toward Goals  OT Goals(current goals can now be found in the care plan section)  Progress towards OT goals: Progressing toward goals  Acute Rehab OT Goals Patient Stated Goal: get stronger OT Goal Formulation: With patient Time For Goal Achievement: 05/30/23 Potential to Achieve Goals: Good ADL Goals Pt Will Perform Grooming: with modified independence;sitting;standing Pt Will Perform Upper Body Dressing: with modified independence;sitting Pt Will Perform Lower Body Dressing: with modified independence;sit to/from stand Pt Will Transfer to Toilet: with modified independence;bedside commode;ambulating Pt Will Perform Toileting - Clothing Manipulation and hygiene: with modified independence;sitting/lateral leans;sit to/from stand Additional ADL Goal #1: Pt will complete pill box test with less than 3 errors.  Plan      Co-evaluation                 AM-PAC OT "6 Clicks" Daily Activity     Outcome Measure   Help from another person eating meals?: A Little Help from another person taking care of personal grooming?: A Little Help from another person toileting, which includes using toliet, bedpan, or urinal?: A Little Help from another person bathing (including washing, rinsing, drying)?: A Little Help from another person to put on and taking off regular upper body clothing?: A Little Help from another person to put on and taking off regular lower body clothing?: A Little 6 Click Score: 18    End of Session Equipment Utilized During Treatment: Rolling walker (2 wheels)  OT Visit Diagnosis:  Unsteadiness on feet (R26.81);Muscle weakness (generalized) (M62.81);Other symptoms and signs involving cognitive function   Activity Tolerance Patient tolerated treatment well   Patient Left in bed;with call bell/phone within reach;with bed alarm set   Nurse Communication Mobility status        Time: 4098-1191 OT Time Calculation (min): 29 min  Charges: OT General Charges $OT Visit: 1 Visit OT Treatments $Self Care/Home Management : 8-22 mins $Therapeutic Activity: 8-22 mins  Alfonse Flavors, OTA Acute Rehabilitation Services  Office [phone]   Dewain Penning 05/26/2023, 2:05 PM

## 2023-05-27 LAB — GLUCOSE, CAPILLARY
Glucose-Capillary: 164 mg/dL — ABNORMAL HIGH (ref 70–99)
Glucose-Capillary: 204 mg/dL — ABNORMAL HIGH (ref 70–99)
Glucose-Capillary: 198 mg/dL — ABNORMAL HIGH (ref 70–99)
Glucose-Capillary: 200 mg/dL — ABNORMAL HIGH (ref 70–99)
Glucose-Capillary: 203 mg/dL — ABNORMAL HIGH (ref 70–99)

## 2023-05-27 LAB — CREATININE, SERUM
Creatinine, Ser: 0.83 mg/dL (ref 0.61–1.24)
GFR, Estimated: >60 mL/min (ref >60)

## 2023-05-27 NOTE — Progress Notes (Signed)
   Subjective/Chief Complaint: Pt doing well this AM C/o diarrhea Tol PO   Objective: Vital signs in last 24 hours: Temp:  [97.8 F (36.6 C)-98.7 F (37.1 C)] 98.4 F (36.9 C) (02/15 0804) Pulse Rate:  [84-92] 85 (02/15 0804) Resp:  [16-18] 16 (02/15 0804) BP: (120-160)/(81-94) 147/81 (02/15 0804) SpO2:  [99 %-100 %] 100 % (02/15 0804) Weight:  [68.1 kg] 68.1 kg (02/15 0505) Last BM Date : 05/26/23  Intake/Output from previous day: 02/14 0701 - 02/15 0700 In: -  Out: 105 [Drains:105] Intake/Output this shift: No intake/output data recorded.  General:  alert and oriented.  Abdomen:  soft non distended, non tender Drain cloudy serosang  Lab Results:  No results for input(s): "WBC", "HGB", "HCT", "PLT" in the last 72 hours. BMET Recent Labs    05/27/23 0540  CREATININE 0.83   Anti-infectives: Anti-infectives (From admission, onward)    Start     Dose/Rate Route Frequency Ordered Stop   05/18/23 0000  ciprofloxacin (CIPRO) 500 MG tablet        500 mg Oral 2 times daily 05/18/23 1250     05/18/23 0000  metroNIDAZOLE (FLAGYL) 500 MG tablet        500 mg Oral Every 12 hours 05/18/23 1250     05/17/23 2000  ciprofloxacin (CIPRO) tablet 500 mg        500 mg Oral 2 times daily 05/17/23 1128 05/20/23 2116   05/17/23 2000  metroNIDAZOLE (FLAGYL) tablet 500 mg        500 mg Oral Every 12 hours 05/17/23 1128 05/20/23 2116   05/14/23 0245  ciprofloxacin (CIPRO) IVPB 400 mg  Status:  Discontinued        400 mg 200 mL/hr over 60 Minutes Intravenous Every 12 hours 05/14/23 0227 05/17/23 1128   05/14/23 0245  metroNIDAZOLE (FLAGYL) IVPB 500 mg  Status:  Discontinued        500 mg 100 mL/hr over 60 Minutes Intravenous Every 12 hours 05/14/23 0227 05/17/23 1128       Assessment/Plan: 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    Readmitted with colitis and fall.   No apparent injury from fall IV  antibiotics for colitis, switched to oral. 7 d total C diff negative as well as stool pathogen panel. Respiratory panel negative.    PT/OT consult.  TOC consult.  Needs to work with stairs more with PT> hope for home early next week if stronger and can safely maneuver at home.  Was still needing help    Lovenox for vte ppx IV fluids for diarrhea/rehydration, off now.  Rehydrated.  Diarrhea reportedly resolved with creon. Insulin for DM   Soft diet. Megace for appetite stimulation and protein calorie malnutrition.    Drain amylase elevated c/w panc leak. Plan d/c with drain.    SNF placement pending   Needs continued work with PT on stairs.    LOS: 14 days    Axel Filler 05/27/2023

## 2023-05-27 NOTE — Plan of Care (Signed)
  Problem: Nutritional: Goal: Maintenance of adequate nutrition will improve Outcome: Progressing   Problem: Skin Integrity: Goal: Risk for impaired skin integrity will decrease Outcome: Progressing   Problem: Education: Goal: Knowledge of General Education information will improve Description: Including pain rating scale, medication(s)/side effects and non-pharmacologic comfort measures Outcome: Progressing   

## 2023-05-27 NOTE — Plan of Care (Signed)
  Problem: Education: Goal: Ability to describe self-care measures that may prevent or decrease complications (Diabetes Survival Skills Education) will improve Outcome: Not Progressing   Problem: Fluid Volume: Goal: Ability to maintain a balanced intake and output will improve Outcome: Progressing   Problem: Health Behavior/Discharge Planning: Goal: Ability to manage health-related needs will improve Outcome: Not Progressing   Problem: Nutritional: Goal: Maintenance of adequate nutrition will improve Outcome: Not Progressing   Problem: Nutritional: Goal: Progress toward achieving an optimal weight will improve Outcome: Not Progressing   Problem: Skin Integrity: Goal: Risk for impaired skin integrity will decrease Outcome: Progressing

## 2023-05-27 NOTE — Progress Notes (Signed)
Patient's wife Andrew Tanner spoke to me this morning about her concerns for her husband. She stated that she is noticing that he is unable to remember things short term, he is weak and not ready to be discharged to home. She stated that prior to coming to the hospital this visit he was able to keep up with his medication needed before he eats, and manage his diabetes effectively. She doesn't feel like he can do either right now. I told her I would note her concerns and that she needed to share them with Dr. Donell Beers next week. She feels strongly that he needs outpatient rehab before he can come home and she is trying to gather evidence of that to support her insurance appeal.

## 2023-05-28 LAB — GLUCOSE, CAPILLARY
Glucose-Capillary: 222 mg/dL — ABNORMAL HIGH (ref 70–99)
Glucose-Capillary: 330 mg/dL — ABNORMAL HIGH (ref 70–99)
Glucose-Capillary: 247 mg/dL — ABNORMAL HIGH (ref 70–99)
Glucose-Capillary: 170 mg/dL — ABNORMAL HIGH (ref 70–99)
Glucose-Capillary: 204 mg/dL — ABNORMAL HIGH (ref 70–99)

## 2023-05-28 NOTE — Progress Notes (Signed)
   Subjective/Chief Complaint: No acute events overnight Worked with PT yesterday   Objective: Vital signs in last 24 hours: Temp:  [97.5 F (36.4 C)-98.6 F (37 C)] 97.5 F (36.4 C) (02/16 0753) Pulse Rate:  [76-101] 76 (02/16 0753) Resp:  [15-18] 16 (02/16 0753) BP: (116-157)/(63-95) 155/87 (02/16 0753) SpO2:  [99 %-100 %] 99 % (02/16 0753) Last BM Date : 05/27/23  Intake/Output from previous day: 02/15 0701 - 02/16 0700 In: 720 [P.O.:720] Out: 365 [Urine:250; Drains:115] Intake/Output this shift: No intake/output data recorded.  General:  alert and oriented.  Abdomen:  soft non distended, non tender Drain cloudy serosang  Lab Results:  No results for input(s): "WBC", "HGB", "HCT", "PLT" in the last 72 hours. BMET Recent Labs    05/27/23 0540  CREATININE 0.83   PT/INR No results for input(s): "LABPROT", "INR" in the last 72 hours. ABG No results for input(s): "PHART", "HCO3" in the last 72 hours.  Invalid input(s): "PCO2", "PO2"  Studies/Results: No results found.  Anti-infectives: Anti-infectives (From admission, onward)    Start     Dose/Rate Route Frequency Ordered Stop   05/18/23 0000  ciprofloxacin (CIPRO) 500 MG tablet        500 mg Oral 2 times daily 05/18/23 1250     05/18/23 0000  metroNIDAZOLE (FLAGYL) 500 MG tablet        500 mg Oral Every 12 hours 05/18/23 1250     05/17/23 2000  ciprofloxacin (CIPRO) tablet 500 mg        500 mg Oral 2 times daily 05/17/23 1128 05/20/23 2116   05/17/23 2000  metroNIDAZOLE (FLAGYL) tablet 500 mg        500 mg Oral Every 12 hours 05/17/23 1128 05/20/23 2116   05/14/23 0245  ciprofloxacin (CIPRO) IVPB 400 mg  Status:  Discontinued        400 mg 200 mL/hr over 60 Minutes Intravenous Every 12 hours 05/14/23 0227 05/17/23 1128   05/14/23 0245  metroNIDAZOLE (FLAGYL) IVPB 500 mg  Status:  Discontinued        500 mg 100 mL/hr over 60 Minutes Intravenous Every 12 hours 05/14/23 0227 05/17/23 1128        Assessment/Plan: 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    Readmitted with colitis and fall.   No apparent injury from fall IV antibiotics for colitis, switched to oral. 7 d total C diff negative as well as stool pathogen panel. Respiratory panel negative.    PT/OT consult.  TOC consult.  Needs to work with stairs more with PT> hope for home early next week if stronger and can safely maneuver at home.  Was still needing help    Lovenox for vte ppx IV fluids for diarrhea/rehydration, off now.  Rehydrated.  Diarrhea reportedly resolved with creon. Insulin for DM   Soft diet. Megace for appetite stimulation and protein calorie malnutrition.    Drain amylase elevated c/w panc leak. Plan d/c with drain.    SNF placement pending   Needs continued work with PT on stairs.    LOS: 15 days    Andrew Tanner 05/28/2023

## 2023-05-28 NOTE — Progress Notes (Signed)
Mobility Specialist Progress Note:    05/28/23 1100  Mobility  Activity Ambulated with assistance in hallway  Level of Assistance Minimal assist, patient does 75% or more  Assistive Device Front wheel walker  Distance Ambulated (ft) 130 ft  Activity Response Tolerated fair  Mobility Referral Yes  Mobility visit 1 Mobility  Mobility Specialist Start Time (ACUTE ONLY) 1030  Mobility Specialist Stop Time (ACUTE ONLY) 1043  Mobility Specialist Time Calculation (min) (ACUTE ONLY) 13 min   Pt received in bed and agreeable. Physical assistance increased from CGA to min/modA d/t fatigue and pt knees buckling. Required mod cues to maintain upright posture. Returned to room w/o fault. Pt left in bed with call bell and bed alarm on.  D'Vante Earlene Plater Mobility Specialist Please contact via Special educational needs teacher or Rehab office at [phone]

## 2023-05-28 NOTE — Plan of Care (Signed)
   Problem: Coping: Goal: Ability to adjust to condition or change in health will improve Outcome: Progressing   Problem: Metabolic: Goal: Ability to maintain appropriate glucose levels will improve Outcome: Progressing

## 2023-05-29 LAB — GLUCOSE, CAPILLARY
Glucose-Capillary: 178 mg/dL — ABNORMAL HIGH (ref 70–99)
Glucose-Capillary: 311 mg/dL — ABNORMAL HIGH (ref 70–99)
Glucose-Capillary: 194 mg/dL — ABNORMAL HIGH (ref 70–99)
Glucose-Capillary: 293 mg/dL — ABNORMAL HIGH (ref 70–99)

## 2023-05-29 NOTE — Progress Notes (Addendum)
Subjective/Chief Complaint: Pt had fall earlier today. Also, patient had multiple stools over the weekend that were diarrhea stools to the extent that it was on the floor and created such a mess that he needed diapers several times.    There is also concern that he is not adequately remembering what he has or hasn't eaten given that he thinks he has ordered food when he hasn't.    Also, patient reweighed this AM and has lost significant weight.  This is not entirely unexpected, but the amount is higher than usual.     Objective: Vital signs in last 24 hours: Temp:  [98 F (36.7 C)-98.9 F (37.2 C)] 98.6 F (37 C) (02/17 1429) Pulse Rate:  [78-92] 92 (02/17 1429) Resp:  [16-18] 18 (02/17 1429) BP: (118-141)/(82-94) 141/82 (02/17 1429) SpO2:  [96 %-100 %] 100 % (02/17 1429) Weight:  [66.4 kg] 66.4 kg (02/17 1100) Last BM Date : 05/28/23  Intake/Output from previous day: 02/16 0701 - 02/17 0700 In: 720 [P.O.:720] Out: 55 [Drains:55] Intake/Output this shift: Total I/O In: 480 [P.O.:480] Out: 300 [Urine:300]  General:  alert and oriented.  Abdomen:  soft non distended, non tender Drain cloudy serosang  Lab Results:  No results for input(s): "WBC", "HGB", "HCT", "PLT" in the last 72 hours. BMET Recent Labs    05/27/23 0540  CREATININE 0.83   PT/INR No results for input(s): "LABPROT", "INR" in the last 72 hours. ABG No results for input(s): "PHART", "HCO3" in the last 72 hours.  Invalid input(s): "PCO2", "PO2"  Studies/Results: No results found.  Anti-infectives: Anti-infectives (From admission, onward)    Start     Dose/Rate Route Frequency Ordered Stop   05/18/23 0000  ciprofloxacin (CIPRO) 500 MG tablet        500 mg Oral 2 times daily 05/18/23 1250     05/18/23 0000  metroNIDAZOLE (FLAGYL) 500 MG tablet        500 mg Oral Every 12 hours 05/18/23 1250     05/17/23 2000  ciprofloxacin (CIPRO) tablet 500 mg        500 mg Oral 2 times daily 05/17/23 1128  05/20/23 2116   05/17/23 2000  metroNIDAZOLE (FLAGYL) tablet 500 mg        500 mg Oral Every 12 hours 05/17/23 1128 05/20/23 2116   05/14/23 0245  ciprofloxacin (CIPRO) IVPB 400 mg  Status:  Discontinued        400 mg 200 mL/hr over 60 Minutes Intravenous Every 12 hours 05/14/23 0227 05/17/23 1128   05/14/23 0245  metroNIDAZOLE (FLAGYL) IVPB 500 mg  Status:  Discontinued        500 mg 100 mL/hr over 60 Minutes Intravenous Every 12 hours 05/14/23 0227 05/17/23 1128       Assessment/Plan: 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    Readmitted with colitis and fall.   No apparent injury from fall IV antibiotics for colitis, switched to oral. 7 d total C diff negative as well as stool pathogen panel. Respiratory panel negative.     Lovenox for vte ppx IV fluids for diarrhea/rehydration, off now.  Rehydrated.  Diarrhea reportedly resolved last week with creon, though it sounds as though this has recurred over the weekend. Increased stools recorded night of 2/13-2/16.  Today's record incomplete at this point. Repeat CT scan to eval colon. Will also see about colon position for possible g tube in IR.    Recheck labs  in AM.  May need to restart antibiotics.  Insulin for DM   Soft diet. Megace for appetite stimulation and protein calorie malnutrition. Recheck labs in AM, evaluating nutritional labs.     Drain amylase elevated c/w panc leak. Plan d/c with drain.    PT/OT consult.  TOC consult. Will add SLP consult.    SNF placement was rejected by insurance, but patient had another fall this weekend and showed some buckling of legs while walking. Was NOT ABLE to do additional stair training due to this weakness.  Required additional assistance with min/mod assist.     This patient has objective data that make it not possible to return home right now.    Appeal in place. Will d/w SW tomorrow what else is needed  for appeal.      Needs continued work with PT on stairs.    LOS: 16 days    [name] 05/29/2023

## 2023-05-29 NOTE — Progress Notes (Signed)
Patient was in the bed resting prior to fall, this writer was sitting outside patients' room when a loud noise came from room, this writer went immediately inside room to find patient on the floor, states he was trying to reach for his walker and go to the bathroom, this Clinical research associate asked him why he didn't use his call bell. patient states he could not find it, call bell was in reach on the bed, nonskid socks were on, bed at the lowest. patient stated he did not hit his head only his elbow, elbow was assessed and slightly red. no other injuries at this time, provider notified and left a massage with triage, patient will notify family member.

## 2023-05-29 NOTE — Progress Notes (Signed)
PT Cancellation Note  Patient Details Name: Andrew Tanner MRN: [id number] DOB: 1950/08/21   Cancelled Treatment:    Reason Eval/Treat Not Completed: Other (comment) (On 1st attempt, patient had just fallen out of bed and requested for PT to come back later. On 2nd attempt, patient just walked with the mobility specialist)   Newman Pies 05/29/2023, 3:56 PM

## 2023-05-29 NOTE — Progress Notes (Signed)
   05/29/23 1600  Mobility  Activity Ambulated with assistance in hallway  Level of Assistance Contact guard assist, steadying assist  Assistive Device Front wheel walker  Distance Ambulated (ft) 100 ft  Activity Response Tolerated well  Mobility Referral Yes  Mobility visit 1 Mobility  Mobility Specialist Start Time (ACUTE ONLY) 1535  Mobility Specialist Stop Time (ACUTE ONLY) 1547  Mobility Specialist Time Calculation (min) (ACUTE ONLY) 12 min   Pt received in bed and agreeable. Only requiring minG and directional cues throughout. Pt left in bed with call bell and all needs met. Bed alarm on.  D'Vante Earlene Plater Mobility Specialist Please contact via Special educational needs teacher or Rehab office at [phone]

## 2023-05-29 NOTE — Care Management Important Message (Signed)
Important Message  Patient Details  Name: Andrew Tanner MRN: [id number] Date of Birth: 1950-04-16   Important Message Given:  Yes - Medicare IM     [name] 05/29/2023, 3:21 PM

## 2023-05-29 NOTE — Plan of Care (Signed)

## 2023-05-30 ENCOUNTER — Inpatient Hospital Stay (HOSPITAL_COMMUNITY): Payer: Medicare Other

## 2023-05-30 LAB — COMPREHENSIVE METABOLIC PANEL
ALT: 109 U/L — ABNORMAL HIGH (ref 0–44)
AST: 37 U/L (ref 15–41)
Albumin: 2.6 g/dL — ABNORMAL LOW (ref 3.5–5.0)
Alkaline Phosphatase: 90 U/L (ref 38–126)
Anion gap: 8 (ref 5–15)
BUN: 21 mg/dL (ref 8–23)
CO2: 20 mmol/L — ABNORMAL LOW (ref 22–32)
Calcium: 9.5 mg/dL (ref 8.9–10.3)
Chloride: 110 mmol/L (ref 98–111)
Creatinine, Ser: 0.89 mg/dL (ref 0.61–1.24)
GFR, Estimated: >60 mL/min (ref >60)
Glucose, Bld: 207 mg/dL — ABNORMAL HIGH (ref 70–99)
Potassium: 3.1 mmol/L — ABNORMAL LOW (ref 3.5–5.1)
Sodium: 138 mmol/L (ref 135–145)
Total Bilirubin: 0.3 mg/dL (ref 0.0–1.2)
Total Protein: 5.5 g/dL — ABNORMAL LOW (ref 6.5–8.1)

## 2023-05-30 LAB — CBC
HCT: 36 % — ABNORMAL LOW (ref 39.0–52.0)
Hemoglobin: 11.8 g/dL — ABNORMAL LOW (ref 13.0–17.0)
MCH: 29.4 pg (ref 26.0–34.0)
MCHC: 32.8 g/dL (ref 30.0–36.0)
MCV: 89.6 fL (ref 80.0–100.0)
Platelets: 281 10*3/uL (ref 150–400)
RBC: 4.02 MIL/uL — ABNORMAL LOW (ref 4.22–5.81)
RDW: 14.3 % (ref 11.5–15.5)
WBC: 7.5 10*3/uL (ref 4.0–10.5)
nRBC: 0 % (ref 0.0–0.2)

## 2023-05-30 LAB — GLUCOSE, CAPILLARY
Glucose-Capillary: 207 mg/dL — ABNORMAL HIGH (ref 70–99)
Glucose-Capillary: 342 mg/dL — ABNORMAL HIGH (ref 70–99)
Glucose-Capillary: 366 mg/dL — ABNORMAL HIGH (ref 70–99)
Glucose-Capillary: 231 mg/dL — ABNORMAL HIGH (ref 70–99)
Glucose-Capillary: 94 mg/dL (ref 70–99)

## 2023-05-30 LAB — PREALBUMIN: Prealbumin: 23 mg/dL (ref 18–38)

## 2023-05-30 MED ORDER — IOHEXOL 350 MG/ML SOLN
75.0000 mL | Freq: Once | INTRAVENOUS | Status: AC | PRN
  Administered 2023-05-30: 75 mL via INTRAVENOUS

## 2023-05-30 NOTE — Progress Notes (Signed)
SLP Cancellation Note  Patient Details Name: Andrew Tanner MRN: [id number] DOB: Nov 11, 1950   Cancelled treatment:       Reason Eval/Treat Not Completed: Fatigue/lethargy limiting ability to participate   Rowe Robert 05/30/2023, 2:34 PM

## 2023-05-30 NOTE — Plan of Care (Signed)
  Problem: Education: Goal: Ability to describe self-care measures that may prevent or decrease complications (Diabetes Survival Skills Education) will improve Outcome: Progressing Goal: Individualized Educational Video(s) Outcome: Progressing   Problem: Coping: Goal: Ability to adjust to condition or change in health will improve Outcome: Progressing   Problem: Fluid Volume: Goal: Ability to maintain a balanced intake and output will improve Outcome: Progressing   Problem: Health Behavior/Discharge Planning: Goal: Ability to identify and utilize available resources and services will improve Outcome: Progressing Goal: Ability to manage health-related needs will improve Outcome: Progressing   Problem: Metabolic: Goal: Ability to maintain appropriate glucose levels will improve Outcome: Progressing   Problem: Nutritional: Goal: Maintenance of adequate nutrition will improve Outcome: Progressing Goal: Progress toward achieving an optimal weight will improve Outcome: Progressing   Problem: Skin Integrity: Goal: Risk for impaired skin integrity will decrease Outcome: Progressing   Problem: Tissue Perfusion: Goal: Adequacy of tissue perfusion will improve Outcome: Progressing   Problem: Education: Goal: Knowledge of General Education information will improve Description: Including pain rating scale, medication(s)/side effects and non-pharmacologic comfort measures Outcome: Progressing   Problem: Health Behavior/Discharge Planning: Goal: Ability to manage health-related needs will improve Outcome: Progressing   Problem: Clinical Measurements: Goal: Ability to maintain clinical measurements within normal limits will improve Outcome: Progressing Goal: Will remain free from infection Outcome: Progressing Goal: Diagnostic test results will improve Outcome: Progressing Goal: Respiratory complications will improve Outcome: Progressing Goal: Cardiovascular complication will  be avoided Outcome: Progressing   Problem: Activity: Goal: Risk for activity intolerance will decrease Outcome: Progressing   Problem: Nutrition: Goal: Adequate nutrition will be maintained Outcome: Progressing   Problem: Elimination: Goal: Will not experience complications related to bowel motility Outcome: Progressing Goal: Will not experience complications related to urinary retention Outcome: Progressing   Problem: Pain Managment: Goal: General experience of comfort will improve and/or be controlled Outcome: Progressing

## 2023-05-30 NOTE — Inpatient Diabetes Management (Signed)
Inpatient Diabetes Program Recommendations  AACE/ADA: New Consensus Statement on Inpatient Glycemic Control (2015)  Target Ranges:  Prepandial:   less than 140 mg/dL      Peak postprandial:   less than 180 mg/dL (1-2 hours)      Critically ill patients:  140 - 180 mg/dL   Lab Results  Component Value Date   GLUCAP 207 (H) 05/30/2023   HGBA1C 6.8 (H) 04/21/2023    Latest Reference Range & Units 05/29/23 06:30 05/29/23 11:11 05/29/23 16:24 05/29/23 20:58 05/30/23 06:58  Glucose-Capillary 70 - 99 mg/dL 161 (H) Novolog 4 units 311 (H) Novolog 15 units 194 (H) Novolog 4 units 293 (H) Novolog 3 units 207 (H)  (H): Data is abnormally high  Diabetes history: DM2 Outpatient Diabetes medications: Lantus 10 units at HS, Lyumjev insulin 3-6 units scale per blood sugar at lunch and supper, Jardiance 25 mg daily, Metformin 1000 mg BID Current orders for Inpatient glycemic control: Novolog 0-20 units correction scale tid, 0-5 units hs  Inpatient Diabetes Program Recommendations:   Patient has received total Novolog 28 units correction over the past 24 hrs. Please consider: -Add Semglee 10 units daily -Decrease Novolog correction to 0-9 units tid, 0-5 units hs  Thank you, Darel Hong E. Bannie Lobban, RN, MSN, CDCES  Diabetes Coordinator Inpatient Glycemic Control Team Team Pager [phone] (8am-5pm) 05/30/2023 10:24 AM

## 2023-05-30 NOTE — Progress Notes (Signed)
Subjective/Chief Complaint: Doing a bit better in terms of stools.     Objective: Vital signs in last 24 hours: Temp:  [98.2 F (36.8 C)-98.9 F (37.2 C)] 98.9 F (37.2 C) (02/18 0719) Pulse Rate:  [69-96] 96 (02/18 0719) Resp:  [16-18] 18 (02/18 0719) BP: (119-132)/(73-89) 129/89 (02/18 0719) SpO2:  [100 %] 100 % (02/18 0719) Last BM Date : 05/28/23  Intake/Output from previous day: 02/17 0701 - 02/18 0700 In: 480 [P.O.:480] Out: 300 [Urine:300] Intake/Output this shift: Total I/O In: -  Out: 30 [Drains:30]  General:  alert and oriented.  Abdomen:  soft non distended, non tender Drain cloudy serosang. Losing suction periodically due to some pull back at the skin.  Having some drainage at the drain exit site.  Will leave in place as it is still partially functional.   Lab Results:  Recent Labs    05/30/23 0531  WBC 7.5  HGB 11.8*  HCT 36.0*  PLT 281   BMET Recent Labs    05/30/23 0531  NA 138  K 3.1*  CL 110  CO2 20*  GLUCOSE 207*  BUN 21  CREATININE 0.89  CALCIUM 9.5   PT/INR No results for input(s): "LABPROT", "INR" in the last 72 hours. ABG No results for input(s): "PHART", "HCO3" in the last 72 hours.  Invalid input(s): "PCO2", "PO2"  Studies/Results: No results found.  Anti-infectives: Anti-infectives (From admission, onward)    Start     Dose/Rate Route Frequency Ordered Stop   05/18/23 0000  ciprofloxacin (CIPRO) 500 MG tablet        500 mg Oral 2 times daily 05/18/23 1250     05/18/23 0000  metroNIDAZOLE (FLAGYL) 500 MG tablet        500 mg Oral Every 12 hours 05/18/23 1250     05/17/23 2000  ciprofloxacin (CIPRO) tablet 500 mg        500 mg Oral 2 times daily 05/17/23 1128 05/20/23 2116   05/17/23 2000  metroNIDAZOLE (FLAGYL) tablet 500 mg        500 mg Oral Every 12 hours 05/17/23 1128 05/20/23 2116   05/14/23 0245  ciprofloxacin (CIPRO) IVPB 400 mg  Status:  Discontinued        400 mg 200 mL/hr over 60 Minutes Intravenous  Every 12 hours 05/14/23 0227 05/17/23 1128   05/14/23 0245  metroNIDAZOLE (FLAGYL) IVPB 500 mg  Status:  Discontinued        500 mg 100 mL/hr over 60 Minutes Intravenous Every 12 hours 05/14/23 0227 05/17/23 1128       Assessment/Plan: 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    Readmitted with colitis and fall.   No apparent injury from fall IV antibiotics for colitis, switched to oral. 7 d total C diff negative as well as stool pathogen panel. Respiratory panel negative.     Lovenox for vte ppx IV fluids for diarrhea/rehydration, off now.  Rehydrated.  Diarrhea reportedly resolved last week with creon, though it sounds as though this has recurred over the weekend. Increased stools recorded night of 2/13-2/16.    Repeat CT scan to eval colon. Will also see about colon position for possible g tube in IR.    Labs look ok today.   Insulin for DM   Soft diet. Megace for appetite stimulation and protein calorie malnutrition. Recheck labs in AM, evaluating nutritional labs.     Drain amylase elevated c/w panc leak. Plan  d/c with drain.    PT/OT consult.  TOC consult. SLP consult pending.   SNF placement was rejected by insurance, but patient had another fall this weekend and showed some buckling of legs while walking. Was NOT ABLE to do additional stair training due to this weakness.  Required additional assistance with min/mod assist.     This patient has objective data that make it not possible to return home right now.    Appeal in place.     Needs continued work with PT on stairs.    LOS: 17 days    [name] 05/30/2023

## 2023-05-30 NOTE — Progress Notes (Signed)
Physical Therapy Treatment Patient Details Name: Andrew Tanner MRN: [id number] DOB: 02-14-1951 Today's Date: 05/30/2023   History of Present Illness 73yo M admitted 2/1 after fall at home and diarrhea.  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 CT scan of the abdomen and pelvis shows diffuse colitis.  He has a small fluid collection near the pancreatico jejunal anastomosis.  PMH: anxiety, DM    PT Comments  Patient willing to participate with therapy. Today's session focused on improving functional strength needed for stair negotiation and balance. Patient educated on the importance of keeping his safety priority one before initiating any functional tasks. Required CGA to ensure safety for sit > stand transfer and during ambulation. Patient demonstrated the ability to utilize bilateral and LE support to ascend from seated position in bed. Verbal cues provided for hand placement for the above transfer. Patient able to ambulate 187ft within the unit with verbal cues provided to maintain safe ambulation when using a RW. At some points during the session, patient bumped into obstacles with his RW and required cueing to maneuver around them. Patient performed therapeutic exercises, therapeutic activities, and balance exercises needed in order to improve functional strength for mobility, static standing, and stairs. Acute care PT will continue to follow up to address the patient's remaining impairments.    If plan is discharge home, recommend the following: A little help with walking and/or transfers;A little help with bathing/dressing/bathroom;Assistance with cooking/housework;Assist for transportation;Help with stairs or ramp for entrance;Supervision due to cognitive status   Can travel by private vehicle     Yes  Equipment Recommendations  None recommended by PT    Recommendations for Other Services Rehab consult     Precautions / Restrictions  Precautions Precautions: Fall Restrictions Weight Bearing Restrictions Per Provider Order: No     Mobility  Bed Mobility                    Transfers Overall transfer level: Needs assistance Equipment used: Rolling walker (2 wheels) Transfers: Sit to/from Stand Sit to Stand: Contact guard assist           General transfer comment: Some cues for hand placement for ascension from seated position    Ambulation/Gait Ambulation/Gait assistance: Contact guard assist Gait Distance (Feet): 100 Feet Assistive device: Rolling walker (2 wheels) Gait Pattern/deviations: Step-through pattern, Decreased dorsiflexion - right, Decreased dorsiflexion - left Gait velocity: decreased     General Gait Details: Cues given to avoid having RW too far out in front   Stairs             Wheelchair Mobility     Tilt Bed    Modified Rankin (Stroke Patients Only)       Balance Overall balance assessment: Needs assistance Sitting-balance support: Feet supported Sitting balance-Leahy Scale: Good Sitting balance - Comments: Seated in Recliner prior to ambulation   Standing balance support: Single extremity supported, Bilateral upper extremity supported, During functional activity Standing balance-Leahy Scale: Poor Standing balance comment: Stood infront of stairs in therapy gym with one hand on railing in order to move RW to the side                            Communication Communication Communication: No apparent difficulties  Cognition Arousal: Alert Behavior During Therapy: Flat affect   PT - Cognitive impairments: Safety/Judgement  PT - Cognition Comments: Recent unwitnessed fall on 2/17 in room, reports that he did not hit his head and has not residual pain following fall Following commands: Intact      Cueing Cueing Techniques: Verbal cues, Tactile cues, Gestural cues  Exercises Other Exercises Other Exercises:  Balance 3x10s ea: EO/EC - both narrow base of support. No UE support Other Exercises: Side Steps 2laps ea direction Other Exercises: Step Ups x5 bilateral LEs Other Exercises: Hip Flexion Taps on Steps x5 bilaterally Other Exercises: Mini Squats with UE support x5    General Comments        Pertinent Vitals/Pain Pain Assessment Pain Assessment: No/denies pain Pain Location: at JP drain Pain Descriptors / Indicators: Sore Pain Intervention(s): Monitored during session    Home Living                          Prior Function            PT Goals (current goals can now be found in the care plan section) Acute Rehab PT Goals Patient Stated Goal: 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 PT Goal Formulation: With patient Time For Goal Achievement: 06/13/23 Potential to Achieve Goals: Good Progress towards PT goals: Progressing toward goals    Frequency    Min 1X/week      PT Plan      Co-evaluation              AM-PAC PT "6 Clicks" Mobility   Outcome Measure  Help needed turning from your back to your side while in a flat bed without using bedrails?: None Help needed moving from lying on your back to sitting on the side of a flat bed without using bedrails?: None Help needed moving to and from a bed to a chair (including a wheelchair)?: A Little Help needed standing up from a chair using your arms (e.g., wheelchair or bedside chair)?: A Little Help needed to walk in hospital room?: A Little Help needed climbing 3-5 steps with a railing? : A Little 6 Click Score: 20    End of Session Equipment Utilized During Treatment: Gait belt Activity Tolerance: Patient tolerated treatment well Patient left: in chair;with call bell/phone within reach;with chair alarm set;with family/visitor present   PT Visit Diagnosis: Unsteadiness on feet (R26.81);Other abnormalities of gait and mobility (R26.89);Muscle weakness (generalized) (M62.81)     Time:  4098-1191 PT Time Calculation (min) (ACUTE ONLY): 27 min  Charges:    $Therapeutic Exercise: 8-22 mins $Therapeutic Activity: 8-22 mins PT General Charges $$ ACUTE PT VISIT: 1 Visit                    Doreen Beam, SPT   Latacha Texeira 05/30/2023, 1:43 PM

## 2023-05-30 NOTE — Progress Notes (Incomplete)
Nutrition Follow-up  DOCUMENTATION CODES:   Severe malnutrition in context of acute illness/injury  INTERVENTION:  ***   NUTRITION DIAGNOSIS:   Severe Malnutrition related to poor appetite (malabsorption) as evidenced by moderate fat depletion, severe fat depletion, severe muscle depletion, energy intake < or equal to 50% for > or equal to 5 days, percent weight loss.  Ongoing  GOAL:   Patient will meet greater than or equal to 90% of their needs  ***  MONITOR:   PO intake, Supplement acceptance, Weight trends, Labs  REASON FOR ASSESSMENT:   Consult Calorie Count  ASSESSMENT:   Pt admitted after fall at home and diarrhea and found to have diffuse colitis and small fluid collection near the pancreaticojejunal anastomosis. 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 Pt with PMH of DM, HLD, HTN  ***  Labs: CBG's range 194-342 in last 24 hours, potassium low  NUTRITION - FOCUSED PHYSICAL EXAM:  {RD Focused Exam List:21252}  Diet Order:   Diet Order             Diet regular Room service appropriate? Yes; Fluid consistency: Thin  Diet effective now           Diet - low sodium heart healthy                   EDUCATION NEEDS:   Education needs have been addressed  Skin:  Skin Assessment: Reviewed RN Assessment  Last BM:  2/10-Type 4  Height:   Ht Readings from Last 1 Encounters:  05/13/23 5\' 11"  (1.803 m)    Weight:   Wt Readings from Last 1 Encounters:  05/29/23 66.4 kg    Ideal Body Weight:  78.2 kg  BMI:  Body mass index is 20.42 kg/m.  Estimated Nutritional Needs:   Kcal:  2100-2300  Protein:  105-120 g  Fluid:  >/= 2 L    ***

## 2023-05-30 NOTE — Plan of Care (Signed)
Problem: Education: Goal: Ability to describe self-care measures that may prevent or decrease complications (Diabetes Survival Skills Education) will improve 05/30/2023 0705 by Waldron Labs, RN Outcome: Progressing 05/30/2023 0700 by Waldron Labs, RN Outcome: Progressing Goal: Individualized Educational Video(s) 05/30/2023 0705 by Waldron Labs, RN Outcome: Progressing 05/30/2023 0700 by Waneta Martins, Wood Novacek, RN Outcome: Progressing   Problem: Coping: Goal: Ability to adjust to condition or change in health will improve 05/30/2023 0705 by Waneta Martins, Cayton Cuevas, RN Outcome: Progressing 05/30/2023 0700 by Waneta Martins, Charnee Turnipseed, RN Outcome: Progressing   Problem: Fluid Volume: Goal: Ability to maintain a balanced intake and output will improve 05/30/2023 0705 by Waneta Martins, Trinten Boudoin, RN Outcome: Progressing 05/30/2023 0700 by Waneta Martins, Roniqua Kintz, RN Outcome: Progressing   Problem: Health Behavior/Discharge Planning: Goal: Ability to identify and utilize available resources and services will improve 05/30/2023 0705 by Waldron Labs, RN Outcome: Progressing 05/30/2023 0700 by Waneta Martins, Marsa Matteo, RN Outcome: Progressing Goal: Ability to manage health-related needs will improve 05/30/2023 0705 by Waneta Martins, Hajime Asfaw, RN Outcome: Progressing 05/30/2023 0700 by Waneta Martins, Elliette Seabolt, RN Outcome: Progressing   Problem: Metabolic: Goal: Ability to maintain appropriate glucose levels will improve 05/30/2023 0705 by Waneta Martins, Lajarvis Italiano, RN Outcome: Progressing 05/30/2023 0700 by Waneta Martins, Gina Costilla, RN Outcome: Progressing   Problem: Nutritional: Goal: Maintenance of adequate nutrition will improve 05/30/2023 0705 by Waldron Labs, RN Outcome: Progressing 05/30/2023 0700 by Waneta Martins, Cadence Minton, RN Outcome: Progressing Goal: Progress toward achieving an optimal weight will improve 05/30/2023 0705 by Waldron Labs, RN Outcome: Progressing 05/30/2023 0700 by  Waneta Martins, Xzavior Reinig, RN Outcome: Progressing   Problem: Skin Integrity: Goal: Risk for impaired skin integrity will decrease 05/30/2023 0705 by Waldron Labs, RN Outcome: Progressing 05/30/2023 0700 by Waneta Martins, Faviola Klare, RN Outcome: Progressing   Problem: Tissue Perfusion: Goal: Adequacy of tissue perfusion will improve 05/30/2023 0705 by Waneta Martins, Eustolia Drennen, RN Outcome: Progressing 05/30/2023 0700 by Waneta Martins, Nikos Anglemyer, RN Outcome: Progressing   Problem: Health Behavior/Discharge Planning: Goal: Ability to manage health-related needs will improve 05/30/2023 0705 by Waneta Martins, Jahaad Penado, RN Outcome: Progressing 05/30/2023 0700 by Waneta Martins, Armari Fussell, RN Outcome: Progressing   Problem: Clinical Measurements: Goal: Ability to maintain clinical measurements within normal limits will improve 05/30/2023 0705 by Waneta Martins, Aaleah Hirsch, RN Outcome: Progressing 05/30/2023 0700 by Waneta Martins Shantele Reller, RN Outcome: Progressing Goal: Will remain free from infection 05/30/2023 0705 by Waldron Labs, RN Outcome: Progressing 05/30/2023 0700 by Waldron Labs, RN Outcome: Progressing Goal: Diagnostic test results will improve 05/30/2023 0705 by Waldron Labs, RN Outcome: Progressing 05/30/2023 0700 by Waldron Labs, RN Outcome: Progressing Goal: Respiratory complications will improve 05/30/2023 0705 by Waldron Labs, RN Outcome: Progressing 05/30/2023 0700 by Waneta Martins Makhayla Mcmurry, RN Outcome: Progressing Goal: Cardiovascular complication will be avoided 05/30/2023 0705 by Waneta Martins Sumner Kirchman, RN Outcome: Progressing 05/30/2023 0700 by Waneta Martins Waris Rodger, RN Outcome: Progressing   Problem: Activity: Goal: Risk for activity intolerance will decrease 05/30/2023 0705 by Waldron Labs, RN Outcome: Progressing 05/30/2023 0700 by Waneta Martins Riona Lahti, RN Outcome: Progressing   Problem: Nutrition: Goal: Adequate nutrition will be maintained 05/30/2023 0705 by  Waldron Labs, RN Outcome: Progressing 05/30/2023 0700 by Waneta Martins Kemiya Batdorf, RN Outcome: Progressing   Problem: Coping: Goal: Level of anxiety will decrease 05/30/2023 0705 by Waldron Labs, RN Outcome: Progressing 05/30/2023 0700 by Waneta Martins, Dior Stepter, RN Outcome: Progressing   Problem: Elimination: Goal: Will not experience complications related to bowel motility 05/30/2023 0705 by Waldron Labs, RN Outcome: Progressing 05/30/2023 0700 by Waldron Labs, RN Outcome: Progressing Goal: Will not experience complications related to urinary retention 05/30/2023 0705 by Waldron Labs, RN Outcome: Progressing 05/30/2023 0700 by Waneta Martins Jhane Lorio, RN Outcome: Progressing  Problem: Pain Managment: Goal: General experience of comfort will improve and/or be controlled 05/30/2023 0705 by Waldron Labs, RN Outcome: Progressing 05/30/2023 0700 by Waneta Martins Aviv Rota, RN Outcome: Progressing   Problem: Skin Integrity: Goal: Risk for impaired skin integrity will decrease 05/30/2023 0705 by Waldron Labs, RN Outcome: Progressing 05/30/2023 0700 by Waldron Labs, RN Outcome: Progressing

## 2023-05-30 NOTE — Progress Notes (Addendum)
Occupational Therapy Treatment Patient Details Name: Andrew Tanner MRN: [id number] DOB: 05-04-1950 Today's Date: 05/30/2023   History of present illness 73yo M admitted 2/1 after fall at home and diarrhea.  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 CT scan of the abdomen and pelvis shows diffuse colitis.  He has a small fluid collection near the pancreatico jejunal anastomosis.  PMH: anxiety, DM   OT comments  Patient seated in recliner, agreeable to OT session.  Pt completed mini cog, scoring 1/5 (significant deficits and needs further evaluation); attempted medi cog but unable to problem solve to attend to complete task without max step by step cues.  Pts initial clock drawing with correct numbers 1-4 and then dashes to signify numbers, second attempt with 2 4s but all other numbers correct and hands incorrectly placed. He realizes the challenge with tasks presented.  Spouse did report difficulty with dexterity with self feeding, pt modified independent today; encouraged pt/spouse OOB for all meals. Will update goals today.  Continue to recommend <3hrs/day inpatient setting.       If plan is discharge home, recommend the following:  A little help with walking and/or transfers;A little help with bathing/dressing/bathroom;Assistance with cooking/housework;Direct supervision/assist for medications management;Direct supervision/assist for financial management;Assist for transportation;Help with stairs or ramp for entrance;Supervision due to cognitive status   Equipment Recommendations  Other (comment);BSC/3in1 (RW)    Recommendations for Other Services      Precautions / Restrictions Precautions Precautions: Fall Restrictions Weight Bearing Restrictions Per Provider Order: No       Mobility Bed Mobility               General bed mobility comments: OOB upon entry    Transfers                         Balance                                            ADL either performed or assessed with clinical judgement   ADL Overall ADL's : Needs assistance/impaired Eating/Feeding: Modified independent;Sitting                                          Extremity/Trunk Assessment              Vision       Perception     Praxis     Communication Communication Communication: No apparent difficulties   Cognition Arousal: Alert Behavior During Therapy: Flat affect Cognition: Cognition impaired   Orientation impairments:  (NT) Awareness: Online awareness impaired, Intellectual awareness intact Memory impairment (select all impairments): Short-term memory, Working memory Attention impairment (select first level of impairment): Sustained attention Executive functioning impairment (select all impairments): Initiation, Sequencing, Organization, Reasoning, Problem solving OT - Cognition Comments: patient scored 1/5 on mini cog, attempted medi cog portion but pt unable to problem solve through without maximal step by step cueing (for exampe and task 1) therefore ended assessment.  Patient with decreased attention and problem solving.                 Following commands: Impaired Following commands impaired: Follows one step commands with increased time, Follows multi-step commands inconsistently  Cueing   Cueing Techniques: Verbal cues, Tactile cues, Visual cues  Exercises      Shoulder Instructions       General Comments session focused on cognition. Encouraged OOB for all meals, as spouse reports difficulty with bimanual dexteritiy with meals.. no difficulty today.    Pertinent Vitals/ Pain       Pain Assessment Pain Assessment: No/denies pain  Home Living                                          Prior Functioning/Environment              Frequency  Min 1X/week        Progress Toward Goals  OT Goals(current goals can now be  found in the care plan section)  Progress towards OT goals: Progressing toward goals (updating goals today)  Acute Rehab OT Goals Patient Stated Goal: get better OT Goal Formulation: With patient Time For Goal Achievement: 06/13/23 Potential to Achieve Goals: Good  Plan      Co-evaluation                 AM-PAC OT "6 Clicks" Daily Activity     Outcome Measure   Help from another person eating meals?: None Help from another person taking care of personal grooming?: A Little Help from another person toileting, which includes using toliet, bedpan, or urinal?: A Little Help from another person bathing (including washing, rinsing, drying)?: A Little Help from another person to put on and taking off regular upper body clothing?: A Little Help from another person to put on and taking off regular lower body clothing?: A Little 6 Click Score: 19    End of Session    OT Visit Diagnosis: Unsteadiness on feet (R26.81);Muscle weakness (generalized) (M62.81);Other symptoms and signs involving cognitive function   Activity Tolerance Patient tolerated treatment well   Patient Left in chair;with call bell/phone within reach;with chair alarm set;with family/visitor present   Nurse Communication Mobility status        Time: 6578-4696 OT Time Calculation (min): 28 min  Charges: OT General Charges $OT Visit: 1 Visit OT Treatments $Self Care/Home Management : 8-22 mins $Cognitive Funtion inital: Initial 15 mins  Barry Brunner, OT Acute Rehabilitation Services Office [phone]   Chancy Milroy 05/30/2023, 1:53 PM

## 2023-05-30 NOTE — Plan of Care (Signed)
  Problem: Acute Rehab PT Goals(only PT should resolve) Goal: Pt Will Go Supine/Side To Sit Outcome: Progressing Flowsheets (Taken 05/15/2023 1636 by Bevelyn Buckles, PT) Pt will go Supine/Side to Sit: Independently Goal: Patient Will Transfer Sit To/From Stand Outcome: Progressing Flowsheets (Taken 05/15/2023 1636 by Bevelyn Buckles, PT) Patient will transfer sit to/from stand: Independently Goal: Pt Will Ambulate Outcome: Progressing Flowsheets (Taken 05/15/2023 1636 by Bevelyn Buckles, PT) Pt will Ambulate:  with supervision  100 feet  with least restrictive assistive device Goal: Pt Will Go Up/Down Stairs Outcome: Progressing Flowsheets (Taken 05/15/2023 1636 by Bevelyn Buckles, PT) Pt will Go Up / Down Stairs:  Flight  with supervision  with least restrictive assistive device

## 2023-05-31 LAB — GLUCOSE, CAPILLARY
Glucose-Capillary: 242 mg/dL — ABNORMAL HIGH (ref 70–99)
Glucose-Capillary: 235 mg/dL — ABNORMAL HIGH (ref 70–99)
Glucose-Capillary: 263 mg/dL — ABNORMAL HIGH (ref 70–99)
Glucose-Capillary: 224 mg/dL — ABNORMAL HIGH (ref 70–99)

## 2023-05-31 MED ORDER — INSULIN ASPART 100 UNIT/ML IJ SOLN
4.0000 [IU] | Freq: Three times a day (TID) | INTRAMUSCULAR | Status: DC
  Administered 2023-05-31 – 2023-06-09 (×25): 4 [IU] via SUBCUTANEOUS

## 2023-05-31 MED ORDER — INSULIN GLARGINE-YFGN 100 UNIT/ML ~~LOC~~ SOLN
10.0000 [IU] | Freq: Every day | SUBCUTANEOUS | Status: DC
  Administered 2023-05-31 – 2023-06-02 (×3): 10 [IU] via SUBCUTANEOUS
  Filled 2023-05-31 (×3): qty 0.1

## 2023-05-31 MED ORDER — INSULIN ASPART 100 UNIT/ML IJ SOLN
0.0000 [IU] | Freq: Three times a day (TID) | INTRAMUSCULAR | Status: DC
  Administered 2023-05-31: 3 [IU] via SUBCUTANEOUS
  Administered 2023-05-31: 5 [IU] via SUBCUTANEOUS
  Administered 2023-06-01 (×2): 3 [IU] via SUBCUTANEOUS
  Administered 2023-06-01: 2 [IU] via SUBCUTANEOUS
  Administered 2023-06-02: 5 [IU] via SUBCUTANEOUS
  Administered 2023-06-02: 3 [IU] via SUBCUTANEOUS
  Administered 2023-06-02: 5 [IU] via SUBCUTANEOUS
  Administered 2023-06-03: 3 [IU] via SUBCUTANEOUS
  Administered 2023-06-03: 2 [IU] via SUBCUTANEOUS
  Administered 2023-06-03: 9 [IU] via SUBCUTANEOUS
  Administered 2023-06-04: 5 [IU] via SUBCUTANEOUS
  Administered 2023-06-04: 3 [IU] via SUBCUTANEOUS
  Administered 2023-06-04: 2 [IU] via SUBCUTANEOUS
  Administered 2023-06-05: 5 [IU] via SUBCUTANEOUS
  Administered 2023-06-05 (×2): 2 [IU] via SUBCUTANEOUS
  Administered 2023-06-06: 1 [IU] via SUBCUTANEOUS
  Administered 2023-06-06: 5 [IU] via SUBCUTANEOUS
  Administered 2023-06-06: 2 [IU] via SUBCUTANEOUS
  Administered 2023-06-07: 3 [IU] via SUBCUTANEOUS
  Administered 2023-06-07: 2 [IU] via SUBCUTANEOUS
  Administered 2023-06-07: 3 [IU] via SUBCUTANEOUS
  Administered 2023-06-08: 5 [IU] via SUBCUTANEOUS
  Administered 2023-06-08: 3 [IU] via SUBCUTANEOUS
  Administered 2023-06-08 – 2023-06-09 (×2): 2 [IU] via SUBCUTANEOUS
  Administered 2023-06-09: 3 [IU] via SUBCUTANEOUS

## 2023-05-31 MED ORDER — INSULIN ASPART 100 UNIT/ML IJ SOLN
0.0000 [IU] | Freq: Every day | INTRAMUSCULAR | Status: DC
  Administered 2023-05-31 – 2023-06-07 (×5): 2 [IU] via SUBCUTANEOUS

## 2023-05-31 NOTE — Progress Notes (Signed)
Mobility Specialist Progress Note:    05/31/23 0900  Mobility  Activity Ambulated with assistance in hallway  Level of Assistance Contact guard assist, steadying assist  Assistive Device Front wheel walker  Distance Ambulated (ft) 120 ft  Activity Response Tolerated well  Mobility Referral Yes  Mobility visit 1 Mobility  Mobility Specialist Start Time (ACUTE ONLY) 0915  Mobility Specialist Stop Time (ACUTE ONLY) 0933  Mobility Specialist Time Calculation (min) (ACUTE ONLY) 18 min   Pt received in bed, eager for mobility. Required minG throughout. Near EOS, pt knees began to buckle. Took x1 seated rest break, then able to return to room w/o fault. Pt left in bed with call bell and all needs met. Bed alarm on.  D'Vante Earlene Plater Mobility Specialist Please contact via Special educational needs teacher or Rehab office at [phone]

## 2023-05-31 NOTE — Inpatient Diabetes Management (Signed)
Inpatient Diabetes Program Recommendations  AACE/ADA: New Consensus Statement on Inpatient Glycemic Control (2015)  Target Ranges:  Prepandial:   less than 140 mg/dL      Peak postprandial:   less than 180 mg/dL (1-2 hours)      Critically ill patients:  140 - 180 mg/dL   Lab Results  Component Value Date   GLUCAP 242 (H) 05/31/2023   HGBA1C 6.8 (H) 04/21/2023    Latest Reference Range & Units 05/30/23 10:45 05/30/23 11:33 05/30/23 16:27 05/30/23 20:02 05/31/23 05:38  Glucose-Capillary 70 - 99 mg/dL 829 (H) Novolog 7 units 366 (H) Novolog 20 units 231 (H) Novolog 7 units 94 242 (H) Novolog 7 units  (H): Data is abnormally high  Diabetes history: DM2 Outpatient Diabetes medications: Lantus 10 units at HS, Lyumjev insulin 3-6 units scale per blood sugar at lunch and supper, Jardiance 25 mg daily, Metformin 1000 mg BID Current orders for Inpatient glycemic control: Novolog 0-20 units correction scale tid, 0-5 units hs   Inpatient Diabetes Program Recommendations:   Patient has received total Novolog 33 units correction over the past 24 hrs. Please consider: -Add Semglee 10 units daily -Add Novolog 4 units tid meal coverage if eats 50% meals -Decrease Novolog correction to 0-9 units tid, 0-5 units   Thank you, Darel Hong E. Yoseline Andersson, RN, MSN, CDCES  Diabetes Coordinator Inpatient Glycemic Control Team Team Pager [phone] (8am-5pm) 05/31/2023 8:50 AM

## 2023-05-31 NOTE — Progress Notes (Signed)
Subjective/Chief Complaint: Denies loose stools.  Had issues with cognitive eval. CT abd/pelvis looked better.  No evidence of recurrent colitis. Also had more knee buckling with walking today.    Objective: Vital signs in last 24 hours: Temp:  [98.3 F (36.8 C)-98.6 F (37 C)] 98.4 F (36.9 C) (02/19 0738) Pulse Rate:  [76-81] 76 (02/19 0738) Resp:  [16] 16 (02/19 0738) BP: (135-137)/(77-91) 136/77 (02/19 0738) SpO2:  [97 %-100 %] 100 % (02/19 0738) Weight:  [68.4 kg] 68.4 kg (02/19 0500) Last BM Date : 05/28/23  Intake/Output from previous day: 02/18 0701 - 02/19 0700 In: 480 [P.O.:480] Out: 60 [Drains:60] Intake/Output this shift: Total I/O In: 350 [P.O.:350] Out: 450 [Urine:400; Drains:50]  General:  alert and oriented.  Abdomen:  soft non distended, non tender Drain cloudy serosang. Losing suction periodically due to some pull back at the skin.  Having some drainage at the drain exit site.  Will leave in place as it is still partially functional.   Lab Results:  Recent Labs    05/30/23 0531  WBC 7.5  HGB 11.8*  HCT 36.0*  PLT 281   BMET Recent Labs    05/30/23 0531  NA 138  K 3.1*  CL 110  CO2 20*  GLUCOSE 207*  BUN 21  CREATININE 0.89  CALCIUM 9.5   PT/INR No results for input(s): "LABPROT", "INR" in the last 72 hours. ABG No results for input(s): "PHART", "HCO3" in the last 72 hours.  Invalid input(s): "PCO2", "PO2"  Studies/Results: CT ABDOMEN PELVIS W CONTRAST Result Date: 05/30/2023 CLINICAL DATA:  Colitis, surgical planning for percutaneous gastrostomy. EXAM: CT ABDOMEN AND PELVIS WITH CONTRAST TECHNIQUE: Multidetector CT imaging of the abdomen and pelvis was performed using the standard protocol following bolus administration of intravenous contrast. RADIATION DOSE REDUCTION: This exam was performed according to the departmental dose-optimization program which includes automated exposure control, adjustment of the mA and/or kV according  to patient size and/or use of iterative reconstruction technique. CONTRAST:  75mL OMNIPAQUE IOHEXOL 350 MG/ML SOLN COMPARISON:  05/13/2023 FINDINGS: Lower chest: Previously noted bibasilar consolidation has improved with minimal residual ground-glass inflammatory pulmonary infiltrate within the lung bases bilaterally. Cardiac size within normal limits. Mild circumferential wall thickening of the distal esophagus is again identified suggesting changes of esophagitis. 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 Mild attic steatosis. No intra hepatic biliary ductal dilation. No enhancing intrahepatic mass. Simple cyst within the left hepatic lobe. Pancreas: Residual body and tail the pancreas is again identified with a retained pancreatic stent in place. There is marked dilation the main pancreatic duct atrophy of the pancreatic parenchyma involving the distal body and tail the pancreas, stable since prior examination with focal calcification in this region. This may reflect changes of a main pancreatic duct stenosis or segmental occlusion in the setting of chronic pancreatitis though a main pancreatic duct to mucinous neoplasm could appear similarly. No superimposed peripancreatic inflammatory changes are identified. Interval decrease in size complex peripancreatic fluid collection adjacent to the proximal pancreas measuring 1.8 x 2.5 cm most in keeping with a a postsurgical collection or necrotic lymph node. Spleen: Unremarkable Adrenals/Urinary Tract: The adrenal glands are unremarkable. The kidneys are normal in size and position. Multiple simple cortical cysts are seen within the right kidney for which no follow-up imaging is recommended. The kidneys are otherwise unremarkable. Bladder unremarkable. Stomach/Bowel: Moderate colonic stool burden without evidence of obstruction. Moderate sigmoid  diverticulosis. Previously  noted inflammatory changes involving the colon have resolved. Surgical changes of gastrojejunostomy are again identified with wide patency of the gastrojejunostomy. There is a viable access window for the residual gastric fundus, best seen on image # 15/3, with the anterior wall of the stomach 13 mm beneath the skin surface. Appendix normal. No free intraperitoneal gas or fluid. Vascular/Lymphatic: Aortic atherosclerosis. No enlarged abdominal or pelvic lymph nodes. Reproductive: Moderate prostatic hypertrophy. Other: No abdominal wall hernia Musculoskeletal: Osseous structures are age-appropriate. No lytic or blastic bone lesion. IMPRESSION: 1. 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 Viable access window for the residual gastric fundus as described above. 2. Interval decrease in size of complex peripancreatic fluid collection adjacent to the proximal pancreas measuring 1.8 x 2.5 cm most in keeping with a postsurgical collection or necrotic lymph node. 3. Interval improvement in previously noted bibasilar consolidation with minimal residual ground-glass inflammatory pulmonary infiltrate within the lung bases bilaterally. 4. Stable mild circumferential wall thickening of the distal esophagus suggesting changes of esophagitis. 5. Interval resolution of inflammatory changes involving the colon. Moderate colonic stool burden without evidence of obstruction. Moderate sigmoid diverticulosis. 6. Moderate prostatic hypertrophy. Aortic Atherosclerosis (ICD10-I70.0). Electronically Signed   By: Helyn Numbers M.D.   On: 05/30/2023 23:24    Anti-infectives: Anti-infectives (From admission, onward)    Start     Dose/Rate Route Frequency Ordered Stop   05/18/23 0000  ciprofloxacin (CIPRO) 500 MG tablet        500 mg Oral 2 times daily 05/18/23 1250     05/18/23 0000  metroNIDAZOLE (FLAGYL) 500 MG tablet        500 mg Oral Every 12 hours 05/18/23 1250     05/17/23 2000   ciprofloxacin (CIPRO) tablet 500 mg        500 mg Oral 2 times daily 05/17/23 1128 05/20/23 2116   05/17/23 2000  metroNIDAZOLE (FLAGYL) tablet 500 mg        500 mg Oral Every 12 hours 05/17/23 1128 05/20/23 2116   05/14/23 0245  ciprofloxacin (CIPRO) IVPB 400 mg  Status:  Discontinued        400 mg 200 mL/hr over 60 Minutes Intravenous Every 12 hours 05/14/23 0227 05/17/23 1128   05/14/23 0245  metroNIDAZOLE (FLAGYL) IVPB 500 mg  Status:  Discontinued        500 mg 100 mL/hr over 60 Minutes Intravenous Every 12 hours 05/14/23 0227 05/17/23 1128       Assessment/Plan: 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    Readmitted with colitis and fall.   No apparent injury from fall IV antibiotics for colitis, switched to oral. 7 d total C diff negative as well as stool pathogen panel. Respiratory panel negative.     Lovenox for vte ppx IV fluids for diarrhea/rehydration, off now.  Rehydrated.  Diarrhea reportedly resolved last week with creon, though it sounds as though this has recurred over the weekend. Increased stools recorded night of 2/13-2/16.    Insulin for DM, discussed with diabetes coordinator.  Adjusted insulin.     Soft diet. Megace for appetite stimulation and protein calorie malnutrition. Albumin <3, but prealbumin 23.  Updated calorie count pending.  If still low, will ask IR about G tube.     Drain amylase elevated c/w panc leak. Plan d/c with drain.    PT/OT consult.  TOC consult. SLP consult pending.   SNF placement  was rejected by insurance, but patient had another fall this weekend and showed some buckling of legs while walking. Was NOT ABLE to do additional stair training due to this weakness.    This patient has objective data that make it not possible to return home right now.    Appeal in place.     Needs continued work with PT on stairs.    LOS: 18 days    [name] 05/31/2023

## 2023-05-31 NOTE — Progress Notes (Signed)
Physical Therapy Treatment Patient Details Name: Andrew Tanner MRN: [id number] DOB: 12-12-1950 Today's Date: 05/31/2023   History of Present Illness 72yo M admitted 2/1 after fall at home and diarrhea.  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 CT scan of the abdomen and pelvis shows diffuse colitis.  He has a small fluid collection near the pancreatico jejunal anastomosis.  PMH: anxiety, DM    PT Comments  Patient willing to participate with therapy. Today's session focused on improving functional strength needed for stair negotiation and balance. Patient educated on the importance of keeping his safety priority one before initiating any functional tasks. Required CGA to ensure safety for sit > stand transfer and during ambulation. Patient demonstrated the ability to utilize bilateral and LE support to ascend from seated position in bed. Verbal cues provided for hand placement for the above transfer. Patient able to ambulate 165ft within the unit with verbal cues provided to maintain safe ambulation when using a RW. Patient performed therapeutic exercises, therapeutic activities, and balance exercises needed in order to improve functional strength for mobility, static standing, and stairs. Acute care PT will continue to follow up to address the patient's remaining impairments.    If plan is discharge home, recommend the following: A little help with walking and/or transfers;A little help with bathing/dressing/bathroom;Assistance with cooking/housework;Assist for transportation;Help with stairs or ramp for entrance;Supervision due to cognitive status   Can travel by private vehicle     Yes  Equipment Recommendations  None recommended by PT    Recommendations for Other Services Rehab consult     Precautions / Restrictions Restrictions Weight Bearing Restrictions Per Provider Order: No     Mobility  Bed Mobility Overal bed mobility: Needs  Assistance Bed Mobility: Supine to Sit     Supine to sit: Supervision     General bed mobility comments: Demonstrates good trunk control to sit upright, moves bilateral LEs OOB independently    Transfers Overall transfer level: Needs assistance Equipment used: Rolling walker (2 wheels) Transfers: Sit to/from Stand Sit to Stand: Contact guard assist           General transfer comment: Some cues for hand placement for ascension from seated position    Ambulation/Gait Ambulation/Gait assistance: Contact guard assist Gait Distance (Feet): 100 Feet Assistive device: Rolling walker (2 wheels) Gait Pattern/deviations: Step-through pattern, Decreased dorsiflexion - right, Decreased dorsiflexion - left Gait velocity: decreased     General Gait Details: Cues given to avoid having RW too far out in front   Stairs Stairs: Yes Stairs assistance: Contact guard assist Stair Management: Two rails, Alternating pattern, Forwards Number of Stairs: 3 General stair comments: Cues for holding on with both hands for increased external support and keeping foot fully on step. Demonstrates an alternating step pattern ascending > descending   Wheelchair Mobility     Tilt Bed    Modified Rankin (Stroke Patients Only)       Balance Overall balance assessment: Needs assistance Sitting-balance support: Feet supported Sitting balance-Leahy Scale: Good     Standing balance support: Single extremity supported, Bilateral upper extremity supported, During functional activity Standing balance-Leahy Scale: Poor Standing balance comment: Stood infront of stairs in therapy gym with one hand on railing in order to move RW to the side                            Communication Communication Communication: No apparent  difficulties  Cognition Arousal: Alert Behavior During Therapy: Flat affect   PT - Cognitive impairments: Safety/Judgement                       PT -  Cognition Comments: Recent unwitnessed fall on 2/17 in room, reports that he did not hit his head and has not residual pain following fall Following commands: Intact Following commands impaired: Follows one step commands with increased time, Follows multi-step commands inconsistently    Cueing Cueing Techniques: Verbal cues, Tactile cues, Visual cues  Exercises Other Exercises Other Exercises: Balance 3x10s ea: EO - both narrow base of support. No UE support. Introudced forward reaching cue to correct posterior weight shift Other Exercises: Side Steps 2laps ea direction Other Exercises: Stadning Hip Extension - Bilaterally 5ea Other Exercises: Hip Flexion Taps on Steps x5 bilaterally Other Exercises: Mini Squats with UE support x5    General Comments        Pertinent Vitals/Pain Pain Assessment Pain Assessment: No/denies pain Pain Location: at JP drain Pain Descriptors / Indicators: Sore Pain Intervention(s): Monitored during session    Home Living                          Prior Function            PT Goals (current goals can now be found in the care plan section) Acute Rehab PT Goals Patient Stated Goal: 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 PT Goal Formulation: With patient Time For Goal Achievement: 06/13/23 Potential to Achieve Goals: Good Progress towards PT goals: Progressing toward goals    Frequency    Min 1X/week      PT Plan      Co-evaluation              AM-PAC PT "6 Clicks" Mobility   Outcome Measure  Help needed turning from your back to your side while in a flat bed without using bedrails?: None Help needed moving from lying on your back to sitting on the side of a flat bed without using bedrails?: None Help needed moving to and from a bed to a chair (including a wheelchair)?: A Little Help needed standing up from a chair using your arms (e.g., wheelchair or bedside chair)?: A Little Help needed to walk in hospital room?:  A Little Help needed climbing 3-5 steps with a railing? : A Little 6 Click Score: 20    End of Session Equipment Utilized During Treatment: Gait belt Activity Tolerance: Patient tolerated treatment well Patient left: in chair;with call bell/phone within reach;with chair alarm set Nurse Communication: Mobility status PT Visit Diagnosis: Unsteadiness on feet (R26.81);Other abnormalities of gait and mobility (R26.89);Muscle weakness (generalized) (M62.81)     Time: 0981-1914 PT Time Calculation (min) (ACUTE ONLY): 22 min  Charges:    $Therapeutic Exercise: 8-22 mins PT General Charges $$ ACUTE PT VISIT: 1 Visit                     Doreen Beam, SPT   Layden Caterino 05/31/2023, 3:31 PM

## 2023-05-31 NOTE — Progress Notes (Signed)
Nutrition Follow-up  DOCUMENTATION CODES:   Severe malnutrition in context of acute illness/injury  INTERVENTION:   Calorie Count. Discussed with RN, calorie count envelope has been hung and RN understands importance of and process of calorie count  Continue Regular Diet; Continue Room Service Appropriate with assist to ensure pt does not accidentally miss meals but to also ensure preferences are met. Ok for family/friends to bring in outside food for pt  Continue Boost Breeze po TID, each supplement provides 250 kcal and 9 grams of protein Continue Ensure Enlive po prn as pt prefers [employer] but has consumed Ensure in the past, each supplement provides 350 kcal and 20 grams of protein. Ensure Enlive/Ensure Plus High Protein has more calories/more protein in the same volume  Continue Magic cup BID with meals, each supplement provides 290 kcal and 9 grams of protein. Greek yogurt on breakfast trays  Continues scheduled snacks between meals  Continue scheduled Creon with meals and with snacks; Creon should not be given if snack or meal missed. Question if Creon dose should be decreased if pt continues without BM, +moderate stool burden in colon  RD reach out to Diabetes Coordinator to discuss CBGs and insulin regimen. Currently only on sliding scale insulin and CBGs in 300s. Diabetes coordinator plans to discuss further with Dr. Donell Beers  NUTRITION DIAGNOSIS:   Severe Malnutrition related to poor appetite (malabsorption) as evidenced by moderate fat depletion, severe fat depletion, severe muscle depletion, energy intake < or equal to 50% for > or equal to 5 days, percent weight loss.  Being addressed   GOAL:   Patient will meet greater than or equal to 90% of their needs  Not Met  MONITOR:   PO intake, Supplement acceptance, Weight trends, Labs  REASON FOR ASSESSMENT:   Consult Calorie Count  ASSESSMENT:   Pt admitted after fall at home and diarrhea and found to have  diffuse colitis and small fluid collection near the pancreaticojejunal anastomosis. 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 Pt with PMH of DM, HLD, HTN  Calorie Count initiated by MD SLP consulted for cognitive evaluation  CT abdomen and pelvis yesterday to evaluate colon and eval colon position for possible G-tube by IR.  Noted moderate amount of stool in colon with moderate diverticulosis but with resolution of inflammatory changes. Possible esophagitis  Recorded po intake 75% of breakfast this AM. Per [facility] system, pt received 2 slices of bacon, plain english muffin and fresh fruit cup.  Pt continues to receive snacks between meals but it is hit or miss whether he feels up to eating them. Pt continues to prefer Boost Breeze over Ensure or Boost Plus. Unclear how often he is consuming and how much. Pt also indicates he likes the [employer] but again unclear if he is consuming. Hopefully detailed calorie count will provide clarification.  Not pt has been denied SNF placement by insurance. Noted pt with fall his weekend, buckling of legs while walking and inability to do additional stair training due to weakness per chart review  CBGs not well controlled at present; noted CBGs in 300s, currently on sliding scale insulin. RD noted DM coordinator note with recommendations from yesterday. RD reached out to DM Coordinator to discuss given continued poor control, DM Coordinator plans to contact Dr. Donell Beers  Labs: CBGs [phone] Meds: ss novolog, Creon 72,000 with meals, Creon 36,000 with snacks, megace, MVI with Minerals, protonix, simethicone   Diet Order:   Diet Order  Diet regular Room service appropriate? Yes; Fluid consistency: Thin  Diet effective now           Diet - low sodium heart healthy                   EDUCATION NEEDS:   Education needs have been addressed  Skin:  Skin Assessment: Reviewed RN Assessment  Last BM:  2/10-Type 4  Height:   Ht  Readings from Last 1 Encounters:  05/13/23 5\' 11"  (1.803 m)    Weight:   Wt Readings from Last 1 Encounters:  05/31/23 68.4 kg    Ideal Body Weight:  78.2 kg  BMI:  Body mass index is 21.03 kg/m.  Estimated Nutritional Needs:   Kcal:  2100-2300  Protein:  105-120 g  Fluid:  >/= 2 L   Romelle Starcher MS, RDN, LDN, CNSC Registered Dietitian 3 Clinical Nutrition RD Inpatient Contact Info in Amion

## 2023-05-31 NOTE — TOC Progression Note (Addendum)
Transition of Care [facility]) - Progression Note    Patient Details  Name: Andrew Tanner MRN: [id number] Date of Birth: [birth year]  [facility]) CM/SW Contact  Lorri Frederick, LCSW Phone Number: 05/31/2023, 10:30 AM  Clinical Narrative:  CSW attempted to call appeal phone number: [phone].  Phone tree states providers cannot use this number, followed prompts and was transferred back to [place], spoke to [place] and they do not show any appeal pending for this pt.    CSW spoke with pt wife [name], she has not heard anything regarding the appeal either.  She will call the above number for update.    1400: CSW and pt called [employer] appeal number, were told that the appeal [id number] was filed on 2/12 and supposed to take 72 hours.  Appeal is still showing as in process at this time.  The rep will send a message inquiring about this, asked for 72 more hours.   1440: TC [employer]: the same representative called CSW back and reports that the appeal was denied on 2/17. MD informed, still supports need for SNF, waiting on additional clinical information to support a new auth request.  Wife updated.   Expected Discharge Plan: Skilled Nursing Facility Barriers to Discharge: Continued Medical Work up, SNF Pending bed offer  Expected Discharge Plan and Services In-house Referral: Clinical Social Work   Post Acute Care Choice: Home Health Living arrangements for the past 2 months: Single Family Home                                       Social Determinants of Health (SDOH) Interventions SDOH Screenings   Food Insecurity: No Food Insecurity (05/14/2023)  Housing: Low Risk  (05/14/2023)  Transportation Needs: No Transportation Needs (05/14/2023)  Utilities: Not At Risk (05/14/2023)  Alcohol Screen: Low Risk  (10/19/2020)  Depression (PHQ2-9): Low Risk  (03/20/2023)  Financial Resource Strain: Low Risk  (03/16/2023)  Physical Activity: Sufficiently Active (03/16/2023)  Social Connections:  Socially Integrated (05/14/2023)  Stress: No Stress Concern Present (03/16/2023)  Tobacco Use: Medium Risk (05/13/2023)  Health Literacy: Adequate Health Literacy (01/03/2023)    Readmission Risk Interventions    05/15/2023    8:24 AM  Readmission Risk Prevention Plan  Transportation Screening Complete  PCP or Specialist Appt within 3-5 Days Complete  HRI or Home Care Consult Complete

## 2023-06-01 LAB — GLUCOSE, CAPILLARY
Glucose-Capillary: 156 mg/dL — ABNORMAL HIGH (ref 70–99)
Glucose-Capillary: 219 mg/dL — ABNORMAL HIGH (ref 70–99)
Glucose-Capillary: 232 mg/dL — ABNORMAL HIGH (ref 70–99)
Glucose-Capillary: 228 mg/dL — ABNORMAL HIGH (ref 70–99)

## 2023-06-01 MED ORDER — PANCRELIPASE (LIP-PROT-AMYL) 12000-38000 UNITS PO CPEP
24000.0000 [IU] | ORAL_CAPSULE | ORAL | Status: DC
  Administered 2023-06-01: 24000 [IU] via ORAL
  Filled 2023-06-01 (×4): qty 2

## 2023-06-01 MED ORDER — PANCRELIPASE (LIP-PROT-AMYL) 36000-114000 UNITS PO CPEP
36000.0000 [IU] | ORAL_CAPSULE | Freq: Three times a day (TID) | ORAL | Status: DC
  Administered 2023-06-01 – 2023-06-09 (×24): 36000 [IU] via ORAL
  Filled 2023-06-01 (×26): qty 1

## 2023-06-01 NOTE — Progress Notes (Signed)
Mobility Specialist Progress Note:   06/01/23 1000  Mobility  Activity Ambulated with assistance in hallway  Level of Assistance Contact guard assist, steadying assist  Assistive Device Front wheel walker  Distance Ambulated (ft) 130 ft  Activity Response Tolerated well  Mobility Referral Yes  Mobility visit 1 Mobility  Mobility Specialist Start Time (ACUTE ONLY) 0908  Mobility Specialist Stop Time (ACUTE ONLY) 0935  Mobility Specialist Time Calculation (min) (ACUTE ONLY) 27 min   Pt received in bed and agreeable. Found to have soiled bed. MS assisted w/ peri care. Utilized chair follow to increase distance. No complaints throughout. Pt wheeled back to room in recliner. Left in bed with call bell and all needs met. Bed alarm on.  D'Vante Earlene Plater Mobility Specialist Please contact via Special educational needs teacher or Rehab office at [phone]

## 2023-06-01 NOTE — Progress Notes (Signed)
Mobility Specialist Progress Note:    06/01/23 1500  Mobility  Activity Ambulated with assistance in room  Level of Assistance Contact guard assist, steadying assist  Assistive Device Front wheel walker  Distance Ambulated (ft) 10 ft  Activity Response Tolerated well  Mobility Referral Yes  Mobility visit 1 Mobility  Mobility Specialist Start Time (ACUTE ONLY) 1508  Mobility Specialist Stop Time (ACUTE ONLY) 1512  Mobility Specialist Time Calculation (min) (ACUTE ONLY) 4 min   Pt received in BR, requesting assistance back to bed. Void successful, though no BM. No complaints throughout. Pt left in bed with call bell and all needs met. Bed alarm on and family present.  D'Vante Earlene Plater Mobility Specialist Please contact via Special educational needs teacher or Rehab office at [phone]

## 2023-06-01 NOTE — Progress Notes (Addendum)
Occupational Therapy Treatment Patient Details Name: Andrew Tanner MRN: [id number] DOB: Feb 23, 1951 Today's Date: 06/01/2023   History of present illness 73yo M admitted 2/1 after fall at home and diarrhea.  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 CT scan of the abdomen and pelvis shows diffuse colitis.  He has a small fluid collection near the pancreatico jejunal anastomosis.  PMH: anxiety, DM   OT comments  Patient supine in bed, reports soiling his brief and needing to change.  Completes bed mobility with supervision, transfers with min assist due to posterior lean and min guard to min assist for functional mobility with RW. He requires mod assist for mgmt of LB dressing (to don a new brief) which is increased assist, and min assist toileting, min assist to maintain balance with grooming at sink.  Pt tends to follow commands with increased time but as pt fatigues requires increased cueing for attention, safety and problem solving. Will follow acutely.       If plan is discharge home, recommend the following:  A little help with walking and/or transfers;Assistance with cooking/housework;Direct supervision/assist for medications management;Direct supervision/assist for financial management;Assist for transportation;Help with stairs or ramp for entrance;Supervision due to cognitive status;A lot of help with bathing/dressing/bathroom   Equipment Recommendations  Other (comment);BSC/3in1 (RW)    Recommendations for Other Services      Precautions / Restrictions Precautions Precautions: Fall Precaution/Restrictions Comments: JP drain right side Restrictions Weight Bearing Restrictions Per Provider Order: No       Mobility Bed Mobility Overal bed mobility: Needs Assistance Bed Mobility: Supine to Sit     Supine to sit: Supervision     General bed mobility comments: increased time, cueing for initation    Transfers Overall transfer level:  Needs assistance Equipment used: Rolling walker (2 wheels) Transfers: Sit to/from Stand Sit to Stand: Min assist           General transfer comment: cueing for hand placement, posterior lean and requires incresed time/cueing with min assist to correct     Balance Overall balance assessment: Needs assistance Sitting-balance support: Feet supported Sitting balance-Leahy Scale: Fair     Standing balance support: Bilateral upper extremity supported, No upper extremity supported, During functional activity Standing balance-Leahy Scale: Poor Standing balance comment: min guard to min assist, posterior lean at times                           ADL either performed or assessed with clinical judgement   ADL Overall ADL's : Needs assistance/impaired     Grooming: Minimal assistance;Contact guard assist;Standing;Wash/dry hands Grooming Details (indicate cue type and reason): cueing to locate soap at sink, up to min assist at times for balance with 0 hand support             Lower Body Dressing: Moderate assistance;Sit to/from stand Lower Body Dressing Details (indicate cue type and reason): to don new brief Toilet Transfer: Minimal assistance Toilet Transfer Details (indicate cue type and reason): RW, grab bar Toileting- Clothing Manipulation and Hygiene: Minimal assistance;Sit to/from stand Toileting - Clothing Manipulation Details (indicate cue type and reason): clothing mgmt over hips     Functional mobility during ADLs: Contact guard assist;Minimal assistance;Rolling walker (2 wheels);Cueing for safety General ADL Comments: cueing for safety, RW mgmt and attention to task    Extremity/Trunk Assessment              Vision  Perception     Praxis     Communication Communication Communication: No apparent difficulties   Cognition Arousal: Alert Behavior During Therapy: Flat affect Cognition: Cognition impaired   Orientation impairments:   (NT) Awareness: Online awareness impaired, Intellectual awareness intact Memory impairment (select all impairments): Short-term memory, Working memory Attention impairment (select first level of impairment): Sustained attention Executive functioning impairment (select all impairments): Initiation, Sequencing, Organization, Reasoning, Problem solving OT - Cognition Comments: pt following simple commands but requires cueing for attention to task and completion of task.  He demonstrates slower processing as he fatigues with ADl performance.                 Following commands: Intact Following commands impaired: Follows one step commands with increased time, Follows multi-step commands inconsistently      Cueing   Cueing Techniques: Verbal cues, Tactile cues  Exercises      Shoulder Instructions       General Comments      Pertinent Vitals/ Pain       Pain Assessment Pain Assessment: No/denies pain  Home Living                                          Prior Functioning/Environment              Frequency  Min 1X/week        Progress Toward Goals  OT Goals(current goals can now be found in the care plan section)  Progress towards OT goals: Progressing toward goals (but needs increased assist with LB ADLs)  Acute Rehab OT Goals Patient Stated Goal: none stated Time For Goal Achievement: 06/13/23 Potential to Achieve Goals: Good  Plan      Co-evaluation                 AM-PAC OT "6 Clicks" Daily Activity     Outcome Measure   Help from another person eating meals?: None Help from another person taking care of personal grooming?: A Little Help from another person toileting, which includes using toliet, bedpan, or urinal?: A Little Help from another person bathing (including washing, rinsing, drying)?: A Lot Help from another person to put on and taking off regular upper body clothing?: A Little Help from another person to put on  and taking off regular lower body clothing?: A Lot 6 Click Score: 17    End of Session Equipment Utilized During Treatment: Rolling walker (2 wheels)  OT Visit Diagnosis: Unsteadiness on feet (R26.81);Muscle weakness (generalized) (M62.81);Other symptoms and signs involving cognitive function   Activity Tolerance Patient tolerated treatment well   Patient Left in chair;with call bell/phone within reach;with chair alarm set   Nurse Communication Mobility status;Other (comment) (pt eating lunch)        Time: 6045-4098 OT Time Calculation (min): 16 min  Charges: OT General Charges $OT Visit: 1 Visit OT Treatments $Self Care/Home Management : 8-22 mins  Barry Brunner, OT Acute Rehabilitation Services Office [phone] Secure Chat Preferred    Chancy Milroy 06/01/2023, 1:52 PM

## 2023-06-01 NOTE — Progress Notes (Signed)
Subjective/Chief Complaint: Denies pain.  Hasn't had a BM in several days.  Continues to pass gas.      Objective: Vital signs in last 24 hours: Temp:  [98.4 F (36.9 C)-99.1 F (37.3 C)] 98.4 F (36.9 C) (02/20 0318) Pulse Rate:  [70-81] 70 (02/20 0318) Resp:  [18-19] 19 (02/20 0318) BP: (117-155)/(73-83) 155/79 (02/20 0318) SpO2:  [89 %-100 %] 100 % (02/19 2215) Last BM Date : 05/28/23  Intake/Output from previous day: 02/19 0701 - 02/20 0700 In: 1280 [P.O.:1280] Out: 480 [Urine:400; Drains:80] Intake/Output this shift: No intake/output data recorded.  General:  alert and oriented.  Flaking skin on face.   Abdomen:  soft non distended, non tender Drain cloudy serosang. Losing suction periodically due to some pull back at the skin.  Having some drainage at the drain exit site.  Will leave in place as it is still partially functional.   Lab Results:  Recent Labs    05/30/23 0531  WBC 7.5  HGB 11.8*  HCT 36.0*  PLT 281   BMET Recent Labs    05/30/23 0531  NA 138  K 3.1*  CL 110  CO2 20*  GLUCOSE 207*  BUN 21  CREATININE 0.89  CALCIUM 9.5   PT/INR No results for input(s): "LABPROT", "INR" in the last 72 hours. ABG No results for input(s): "PHART", "HCO3" in the last 72 hours.  Invalid input(s): "PCO2", "PO2"  Studies/Results: CT ABDOMEN PELVIS W CONTRAST Result Date: 05/30/2023 CLINICAL DATA:  Colitis, surgical planning for percutaneous gastrostomy. EXAM: CT ABDOMEN AND PELVIS WITH CONTRAST TECHNIQUE: Multidetector CT imaging of the abdomen and pelvis was performed using the standard protocol following bolus administration of intravenous contrast. RADIATION DOSE REDUCTION: This exam was performed according to the departmental dose-optimization program which includes automated exposure control, adjustment of the mA and/or kV according to patient size and/or use of iterative reconstruction technique. CONTRAST:  75mL OMNIPAQUE IOHEXOL 350 MG/ML SOLN COMPARISON:   05/13/2023 FINDINGS: Lower chest: Previously noted bibasilar consolidation has improved with minimal residual ground-glass inflammatory pulmonary infiltrate within the lung bases bilaterally. Cardiac size within normal limits. Mild circumferential wall thickening of the distal esophagus is again identified suggesting changes of esophagitis. 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 Mild attic steatosis. No intra hepatic biliary ductal dilation. No enhancing intrahepatic mass. Simple cyst within the left hepatic lobe. Pancreas: Residual body and tail the pancreas is again identified with a retained pancreatic stent in place. There is marked dilation the main pancreatic duct atrophy of the pancreatic parenchyma involving the distal body and tail the pancreas, stable since prior examination with focal calcification in this region. This may reflect changes of a main pancreatic duct stenosis or segmental occlusion in the setting of chronic pancreatitis though a main pancreatic duct to mucinous neoplasm could appear similarly. No superimposed peripancreatic inflammatory changes are identified. Interval decrease in size complex peripancreatic fluid collection adjacent to the proximal pancreas measuring 1.8 x 2.5 cm most in keeping with a a postsurgical collection or necrotic lymph node. Spleen: Unremarkable Adrenals/Urinary Tract: The adrenal glands are unremarkable. The kidneys are normal in size and position. Multiple simple cortical cysts are seen within the right kidney for which no follow-up imaging is recommended. The kidneys are otherwise unremarkable. Bladder unremarkable. Stomach/Bowel: Moderate colonic stool burden without evidence of obstruction. Moderate sigmoid diverticulosis. Previously noted inflammatory changes involving the colon have resolved. Surgical changes of gastrojejunostomy are again identified  with wide  patency of the gastrojejunostomy. There is a viable access window for the residual gastric fundus, best seen on image # 15/3, with the anterior wall of the stomach 13 mm beneath the skin surface. Appendix normal. No free intraperitoneal gas or fluid. Vascular/Lymphatic: Aortic atherosclerosis. No enlarged abdominal or pelvic lymph nodes. Reproductive: Moderate prostatic hypertrophy. Other: No abdominal wall hernia Musculoskeletal: Osseous structures are age-appropriate. No lytic or blastic bone lesion. IMPRESSION: 1. 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 Viable access window for the residual gastric fundus as described above. 2. Interval decrease in size of complex peripancreatic fluid collection adjacent to the proximal pancreas measuring 1.8 x 2.5 cm most in keeping with a postsurgical collection or necrotic lymph node. 3. Interval improvement in previously noted bibasilar consolidation with minimal residual ground-glass inflammatory pulmonary infiltrate within the lung bases bilaterally. 4. Stable mild circumferential wall thickening of the distal esophagus suggesting changes of esophagitis. 5. Interval resolution of inflammatory changes involving the colon. Moderate colonic stool burden without evidence of obstruction. Moderate sigmoid diverticulosis. 6. Moderate prostatic hypertrophy. Aortic Atherosclerosis (ICD10-I70.0). Electronically Signed   By: Helyn Numbers M.D.   On: 05/30/2023 23:24    Anti-infectives: Anti-infectives (From admission, onward)    Start     Dose/Rate Route Frequency Ordered Stop   05/18/23 0000  ciprofloxacin (CIPRO) 500 MG tablet        500 mg Oral 2 times daily 05/18/23 1250     05/18/23 0000  metroNIDAZOLE (FLAGYL) 500 MG tablet        500 mg Oral Every 12 hours 05/18/23 1250     05/17/23 2000  ciprofloxacin (CIPRO) tablet 500 mg        500 mg Oral 2 times daily 05/17/23 1128 05/20/23 2116   05/17/23 2000  metroNIDAZOLE (FLAGYL)  tablet 500 mg        500 mg Oral Every 12 hours 05/17/23 1128 05/20/23 2116   05/14/23 0245  ciprofloxacin (CIPRO) IVPB 400 mg  Status:  Discontinued        400 mg 200 mL/hr over 60 Minutes Intravenous Every 12 hours 05/14/23 0227 05/17/23 1128   05/14/23 0245  metroNIDAZOLE (FLAGYL) IVPB 500 mg  Status:  Discontinued        500 mg 100 mL/hr over 60 Minutes Intravenous Every 12 hours 05/14/23 0227 05/17/23 1128       Assessment/Plan: 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    Readmitted with colitis and fall.   No apparent injury from fall IV antibiotics for colitis, switched to oral. 7 d total C diff negative as well as stool pathogen panel. Respiratory panel negative.     Lovenox for vte ppx IV fluids for diarrhea/rehydration, off now.  Rehydrated.  Diarrhea resolved.  Now appears constipated.  Decreasing creon.    Insulin for DM, discussed with diabetes coordinator.  Adjusted insulin.     Soft diet. Megace for appetite stimulation and protein calorie malnutrition. Albumin <3, but prealbumin 23.  Updated calorie count pending.  If still low, will ask IR about G tube.     Drain amylase elevated c/w panc leak. Plan d/c with drain.    PT/OT consult.  TOC consult. Official SLP consult pending.  Consider reconsult CIR.   SNF placement was rejected by insurance, but patient had at least one additional fall and is periodically having some buckling of legs while walking, especially while fatigued. Was NOT  ABLE to do additional stair training due to this weakness.    This patient has objective data that make it not possible to return home right now.    Will resubmit for SNF after formal SLP eval and calorie counts.  Subjectively oral intake appears to be improving.  However, if he still is only 50% percentage for caloric needs orally, will ask IR about G tube.      LOS: 19 days    [name] 06/01/2023

## 2023-06-01 NOTE — Progress Notes (Addendum)
Calorie Count Note  Patient resting in bed with wife [name] at bedside. He had 50% of his lunch tray, 50% of a Magic cup and 50% of a Boost breeze consumed on his meal tray from lunch. Patient states he thinks he is doing better and eating, has an appetite. Wife has not brought in any snacks or meals from home but is planning on bringing them in over the weekend. Patient is currently eating small portions at his meals. He says he is too full to eat snacks. Encouraged PO intake and answered any questions wife or patient had. CBG's seem to be improving now ranging 150-220 mg/dL.   No BM yet, MD decreased creon.   48 hour calorie count ordered.  Diet: Low sodium, regular diet  Supplements: Boost Breeze, Magic cup, snacks   2/19:  No meal ticket, documented intake: 75% Breakfast (2 slices bacon, english muffin, fruit cup, greek yogurt) 200 kcal, 15 gm protein  50% Lunch (spaghetti noodles, meat sauce, broccoli, mac n cheese, corn) 240 kcal, 10 gm protein 50% Dinner: ([place], mashed potatoes, gravy, green beans,100% fruit cup) 190 kcal, 10 gm protein  Supplements: 0 Magic cups documented, 0 snacks documented. 2 Boost Breeze 500 kcal, 18 gm protein  Total intake: 1130 kcal (54% of minimum estimated needs)  53 gm protein (50% of minimum estimated needs)  INTERVENTION:  Continue Calorie count Continue Regular Diet Continue Boost Breeze po TID, each supplement provides 250 kcal and 9 grams of protein Ensure Enlive PRN patient prefers boost but will drink occasionally  Continue Magic cup BID with meals, each supplement provides 290 kcal and 9 grams of protein.  Continue [place] yogurt on breakfast trays Continue scheduled snacks between meals Continue scheduled Creon with meals and with snacks; Creon should not be given if snack or meal missed.   NUTRITION DIAGNOSIS:    Severe Malnutrition related to poor appetite (malabsorption) as evidenced by moderate fat depletion, severe fat depletion,  severe muscle depletion, energy intake < or equal to 50% for > or equal to 5 days, percent weight loss.   Being addressed    GOAL:    Patient will meet greater than or equal to 90% of their needs   Not Met   MONITOR:    PO intake, Supplement acceptance, Weight trends, Labs   Elliot Dally, RD Registered Dietitian  See Amion for more information

## 2023-06-02 LAB — GLUCOSE, CAPILLARY
Glucose-Capillary: 228 mg/dL — ABNORMAL HIGH (ref 70–99)
Glucose-Capillary: 280 mg/dL — ABNORMAL HIGH (ref 70–99)
Glucose-Capillary: 266 mg/dL — ABNORMAL HIGH (ref 70–99)
Glucose-Capillary: 249 mg/dL — ABNORMAL HIGH (ref 70–99)

## 2023-06-02 MED ORDER — PANTOPRAZOLE SODIUM 40 MG PO TBEC
40.0000 mg | DELAYED_RELEASE_TABLET | Freq: Two times a day (BID) | ORAL | Status: DC
  Administered 2023-06-02 – 2023-06-09 (×14): 40 mg via ORAL
  Filled 2023-06-02 (×14): qty 1

## 2023-06-02 MED ORDER — BISACODYL 5 MG PO TBEC
5.0000 mg | DELAYED_RELEASE_TABLET | Freq: Once | ORAL | Status: DC
  Filled 2023-06-02: qty 1

## 2023-06-02 MED ORDER — INSULIN GLARGINE-YFGN 100 UNIT/ML ~~LOC~~ SOLN
12.0000 [IU] | Freq: Every day | SUBCUTANEOUS | Status: DC
  Administered 2023-06-03 – 2023-06-05 (×3): 12 [IU] via SUBCUTANEOUS
  Filled 2023-06-02 (×4): qty 0.12

## 2023-06-02 MED ORDER — PANCRELIPASE (LIP-PROT-AMYL) 12000-38000 UNITS PO CPEP: 24000.0000 [IU] | ORAL_CAPSULE | ORAL | Status: DC | PRN

## 2023-06-02 NOTE — Progress Notes (Signed)
Calorie Count Note  48 hour calorie count ordered.  Collected meal tickets from door. No BM yet. Last recorded BM was 2/17. MD decreased Creon. Per discussion with RD that spoke with him yesterday, he continues to refuse nutrition supplements other than the Boost Breeze. Wife was planning on bringing him food from home that she believes he will enjoy. CBGs ranging from 219-280 in last 24 hours.   Pt consuming 78% of estimated needs in last 24 hours. Although this is an increase from the last calorie count from 2/19, 40% of calories are coming from nutrition supplements. Per chart review, MD will monitor po intake & weight trends over the weekend and will recommend G tube if not stable.  Diet: Regular Diet Supplements: Magic cup, Boost Breeze   2/20: Breakfast: 75% consumed (oatmeal, 2 buttermilk pancakes, pork sausage patty, vanilla greek yogurt, banana). 668 kcal, 18 g protein Lunch: pot roast & brown gravy (50%), mashed potatoes (25%). 128 kcal, 9 g protein Dinner: meatloaf & brown gravy (75%), green peas (25%). 163 kcal, 12 g protein  Supplements: 1 Magic cup, 50% magic cup, 100% 1 Boost Breeze. 685 kcal, 22.5 g protein  Total intake: 1644 kcal (78% of minimum estimated needs)  61.5 protein (59% of minimum estimated needs)  Intervention: Recommend tube feed placement per MD note if within patients goals of care Continue Boost Breeze po TID, each supplement provides 250 kcal and 9 grams of protein Ensure Enlive PRN patient prefers boost but will drink occasionally  Continue Magic cup BID with meals, each supplement provides 290 kcal and 9 grams of protein.  Continue [place] yogurt on breakfast trays Continue scheduled snacks between meals Continue scheduled Creon with meals and with snacks; Creon should not be given if snack or meal missed.    Nutrition Dx:  Severe Malnutrition related to poor appetite (malabsorption) as evidenced by moderate fat depletion, severe fat depletion,  severe muscle depletion, energy intake < or equal to 50% for > or equal to 5 days, percent weight loss.   Being addressed  Goal: Patient will meet greater than or equal to 90% of their needs   Not met  Monitor: PO intake, Supplement acceptance, weight trends, labs    Andrew Pro, MS Dietetic Intern

## 2023-06-02 NOTE — Progress Notes (Signed)
Mobility Specialist Progress Note:    06/02/23 1100  Mobility  Activity Ambulated with assistance in hallway  Level of Assistance Contact guard assist, steadying assist  Assistive Device Front wheel walker  Distance Ambulated (ft) 130 ft  Activity Response Tolerated well  Mobility Referral Yes  Mobility visit 1 Mobility  Mobility Specialist Start Time (ACUTE ONLY) [id number]  Mobility Specialist Stop Time (ACUTE ONLY) 0956  Mobility Specialist Time Calculation (min) (ACUTE ONLY) 18 min   Pt received in bed and agreeable. Able to stand w/ only SV. No complaints throughout. Used chair follow and wheeled pt back to room in recliner. Declined sitting up in chair d/t discomfort. Pt left in bed with call bell and all needs met. Bed alarm on.  D'Vante Earlene Plater Mobility Specialist Please contact via Special educational needs teacher or Rehab office at [phone]

## 2023-06-02 NOTE — Progress Notes (Signed)
PT Cancellation Note  Patient Details Name: Andrew Tanner MRN: [id number] DOB: 1950-11-29   Cancelled Treatment:    Reason Eval/Treat Not Completed: Other (comment) working with mobility specialist.  Lillia Pauls, PT, DPT Acute Rehabilitation Services Office [phone]    Norval Morton 06/02/2023, 10:15 AM

## 2023-06-02 NOTE — Progress Notes (Signed)
Subjective/Chief Complaint: Still no stool for several days.  Dietitian recorder 55 % intake for calorie counts, but patient certainly seems to be doing better than that.  No comment from speech yet.    Objective: Vital signs in last 24 hours: Temp:  [98 F (36.7 C)-99.1 F (37.3 C)] 98 F (36.7 C) (02/21 0900) Pulse Rate:  [84-95] 92 (02/21 0900) Resp:  [18] 18 (02/21 0900) BP: (130-145)/(84-87) 145/84 (02/21 0900) SpO2:  [98 %-100 %] 98 % (02/21 0900) Last BM Date : 05/29/23  Intake/Output from previous day: 02/20 0701 - 02/21 0700 In: 800 [P.O.:800] Out: 460 [Urine:350; Drains:110] Intake/Output this shift: Total I/O In: -  Out: 30 [Drains:30]  General:  alert and oriented.  Abdomen:  soft non distended, non tender Drain cloudy serosang. Periodically not holding suction, but would definitely leave in place.    Lab Results:  No results for input(s): "WBC", "HGB", "HCT", "PLT" in the last 72 hours.  BMET No results for input(s): "NA", "K", "CL", "CO2", "GLUCOSE", "BUN", "CREATININE", "CALCIUM" in the last 72 hours.  PT/INR No results for input(s): "LABPROT", "INR" in the last 72 hours. ABG No results for input(s): "PHART", "HCO3" in the last 72 hours.  Invalid input(s): "PCO2", "PO2"  Studies/Results: No results found.   Anti-infectives: Anti-infectives (From admission, onward)    Start     Dose/Rate Route Frequency Ordered Stop   05/18/23 0000  ciprofloxacin (CIPRO) 500 MG tablet        500 mg Oral 2 times daily 05/18/23 1250     05/18/23 0000  metroNIDAZOLE (FLAGYL) 500 MG tablet        500 mg Oral Every 12 hours 05/18/23 1250     05/17/23 2000  ciprofloxacin (CIPRO) tablet 500 mg        500 mg Oral 2 times daily 05/17/23 1128 05/20/23 2116   05/17/23 2000  metroNIDAZOLE (FLAGYL) tablet 500 mg        500 mg Oral Every 12 hours 05/17/23 1128 05/20/23 2116   05/14/23 0245  ciprofloxacin (CIPRO) IVPB 400 mg  Status:  Discontinued        400 mg 200 mL/hr over  60 Minutes Intravenous Every 12 hours 05/14/23 0227 05/17/23 1128   05/14/23 0245  metroNIDAZOLE (FLAGYL) IVPB 500 mg  Status:  Discontinued        500 mg 100 mL/hr over 60 Minutes Intravenous Every 12 hours 05/14/23 0227 05/17/23 1128       Assessment/Plan: 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    Readmitted with colitis and fall.   No apparent injury from fall IV antibiotics for colitis, switched to oral. 7 d total C diff negative as well as stool pathogen panel. Respiratory panel negative.     Lovenox for vte ppx IV fluids for diarrhea/rehydration, off now.  Rehydrated.  Diarrhea resolved.  Now appears constipated.  Decreased creon.  Add one time dulcolax today.    Insulin for DM, discussed with diabetes coordinator.  Adjusted insulin.     Soft diet. Megace for appetite stimulation and protein calorie malnutrition. Albumin <3, but prealbumin 23.  See how po intake is over weekend and reevaluate weight Monday AM.  If not stable, will ask IR for G tube.     Drain amylase elevated c/w panc leak. Plan d/c with drain.    PT/OT consult.  TOC consult. Official SLP consult still not in chart.  Have reconsulted SLP  SNF  placement was rejected by insurance, but patient had at least one additional fall and is periodically having some buckling of legs while walking, especially while fatigued. Was NOT ABLE to do additional stair training due to this weakness.    This patient has objective data that make it not possible to return home right now.    Dispo:  await placement.  Still feel that patient is unsafe for home d/c based on intermittent weakness/falls, poor judgment issues leading to unsafe situations, and need for more help with mobility than is available at home.   Immediate issues - possible g tube for poor po intake.       LOS: 20 days    [name] 06/02/2023

## 2023-06-02 NOTE — TOC Progression Note (Signed)
Transition of Care [facility]) - Progression Note    Patient Details  Name: Andrew Tanner MRN: [id number] Date of Birth: October 16, 1950  Transition of Care [facility] CM/SW Contact  Lorri Frederick, LCSW Phone Number: 06/02/2023, 1:58 PM  Clinical Narrative:   CSW was sent letter received to pt home address regarding second appeal that happens automatically by [employer].  Letter gives option to send additional information by fax.  Speech, OT, PT, MD notes all faxed.  CSW spoke with Dr Donell Beers regarding DC plans moving forward. She does want to resubmit auth request for SNF, does support that pt cannot DC home.  Do to pending decision on tube feeding, would like to wait to resubmit new auth request until that decision has been made.  CSW unsure if new auth request can be submitted while the appeals process still happening.     Expected Discharge Plan: Skilled Nursing Facility Barriers to Discharge: Continued Medical Work up, SNF Pending bed offer  Expected Discharge Plan and Services In-house Referral: Clinical Social Work   Post Acute Care Choice: Home Health Living arrangements for the past 2 months: Single Family Home                                       Social Determinants of Health (SDOH) Interventions SDOH Screenings   Food Insecurity: No Food Insecurity (05/14/2023)  Housing: Low Risk  (05/14/2023)  Transportation Needs: No Transportation Needs (05/14/2023)  Utilities: Not At Risk (05/14/2023)  Alcohol Screen: Low Risk  (10/19/2020)  Depression (PHQ2-9): Low Risk  (03/20/2023)  Financial Resource Strain: Low Risk  (03/16/2023)  Physical Activity: Sufficiently Active (03/16/2023)  Social Connections: Socially Integrated (05/14/2023)  Stress: No Stress Concern Present (03/16/2023)  Tobacco Use: Medium Risk (05/13/2023)  Health Literacy: Adequate Health Literacy (01/03/2023)    Readmission Risk Interventions    05/15/2023    8:24 AM  Readmission Risk Prevention Plan  Transportation  Screening Complete  PCP or Specialist Appt within 3-5 Days Complete  HRI or Home Care Consult Complete

## 2023-06-02 NOTE — Plan of Care (Signed)

## 2023-06-02 NOTE — Evaluation (Signed)
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   Poorly controlled type 2 diabetes mellitus with circulatory disorder (HCC) 02/08/2023   Vitamin D deficiency 07/25/2022   Pain in both feet 02/10/2022   Peripheral neuropathy 01/25/2022   Seborrheic dermatitis 01/25/2022   Positive colorectal cancer screening using Cologuard test 03/16/2020   Chronic RLQ pain 03/16/2020   Type 2 diabetes mellitus with hyperglycemia, without long-term current use of insulin (HCC) 06/02/2011   Hypertension 06/02/2011   Hyperlipemia 06/02/2011   Cataract 06/02/2011   Past Medical History:  Past Medical History:  Diagnosis Date   Anxiety    Atrial myxoma    Basal cell carcinoma    Cancer (HCC)    basal cell skin CA   Cataract    Depression    Diabetes mellitus without complication (HCC)    Diabetic neuropathy (HCC)    Duodenal adenoma    Hyperlipidemia    Hypertension    IC (interstitial cystitis)    Neuromuscular disorder (HCC) 01/09/21   Peripheral  Neuropathy   Pancreatic lesion    Squamous cell carcinoma of skin    Past Surgical History:  Past Surgical History:  Procedure Laterality Date   BIOPSY  12/05/2022   Procedure: BIOPSY;  Surgeon: [name], MD;  Location: [facility];  Service: Gastroenterology;;   CATARACT EXTRACTION, BILATERAL     ESOPHAGOGASTRODUODENOSCOPY N/A 12/05/2022   Procedure: ESOPHAGOGASTRODUODENOSCOPY (EGD);  Surgeon: [name], MD;  Location: [facility];   Service: Gastroenterology;  Laterality: N/A;   EUS N/A 12/05/2022   Procedure: UPPER ENDOSCOPIC ULTRASOUND (EUS) RADIAL;  Surgeon: [name], MD;  Location: [facility];  Service: Gastroenterology;  Laterality: N/A;   FINE NEEDLE ASPIRATION N/A 12/05/2022   Procedure: FINE NEEDLE ASPIRATION (FNA) LINEAR;  Surgeon: [name], MD;  Location: [facility];  Service: Gastroenterology;  Laterality: N/A;   HERNIA REPAIR N/A    inguinal   NECK SURGERY     ant/post   SPINE SURGERY     SUBMUCOSAL TATTOO INJECTION  12/05/2022   Procedure: SUBMUCOSAL TATTOO INJECTION;  Surgeon: [name], MD;  Location: [facility];  Service: Gastroenterology;;   TRANSURETHRAL RESECTION OF PROSTATE     TURP VAPORIZATION     WHIPPLE PROCEDURE N/A 04/27/2023   Procedure: WHIPPLE PROCEDURE;  Surgeon: [name], MD;  Location: [facility];  Service: General;  Laterality: N/A;   HPI:  73yo M admitted 2/1 after fall at home and diarrhea. Head CT 2/1 without acute findings.  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 CT scan of the abdomen and pelvis shows diffuse colitis.  He has a small fluid collection near the pancreatico jejunal anastomosis. Poor po intake. Possible plan for feeding tube.  PMH: anxiety, DM   Assessment / Plan / Recommendation Clinical Impression  Pt presents with cogntive-linguistic deficits, especially with memory.  Pt was assessed using the COGNISTAT (see below for additional information).  Pt scored within the average range on all subtests administered except for recall (both registration and word recall) and  attention.  Pt exhibited moderate impairment on attention task with poor digit span repetition.  He was able to maintain attention for more complex tasks, but this may be impacting his processing speed.  Pt appered to have some response latency across the board.  Visuospatial ability was assessed using clock drawing, which  showed mild impariment.  Number placement was fairly accurate, but less even spaced as he went around (he placed number clockwise rather than placing quadrant numbers first) and hand placement for time was incorrect.   Pt's wife reports memory issues are persisting outside of assessment tasks.  He has not been able to recall instructions for medical care with even a 30 minute delay.  On his d/c home he experienced multiple falls.    Speech was clear and without dysarthria.  He was able to participate in higher level conversation without any obvious word finding deficits, but intermittently exhibited slowed response times.  Wife reports improvement in ability to engage in conversation.  He sometimes has more difficulties following administration of pain medication.  He reports tiredness affects this as well.  He reports low vocal intensity, but also notes this has been improving.   At present, given general weakness and cognitive impairment he does not appear safe to return home as there is a concern for safety awareness and ability to recall and follow instructions.  Pt would benefit from ST in house and and next level of care; preferably, skilled rehab, or inpatient rehab if appropriate.  COGNISTAT: All subtests are within the average range, except where otherwise specified.  Orientation:  10/12 Attention: 3/8, Moderate Impairment Comprehension: 5/6 Repetition: 12/12 Naming: 8/8 Construction: clock drawing Memory: 4/12, Severe Impairment Calculations: 3/4 Similarities: 8/8 Judgment: 4/6      SLP Assessment  SLP Recommendation/Assessment: Patient needs continued Speech Lanaguage Pathology Services SLP Visit Diagnosis: Cognitive communication deficit (R41.841)    Recommendations for follow up therapy are one component of a multi-disciplinary discharge planning process, led by the attending physician.  Recommendations may be updated based on patient status, additional functional criteria and  insurance authorization.    Follow Up Recommendations  Skilled nursing-short term rehab (<3 hours/day)    Assistance Recommended at Discharge  Frequent or constant Supervision/Assistance  Functional Status Assessment Patient has had a recent decline in their functional status and demonstrates the ability to make significant improvements in function in a reasonable and predictable amount of time.  Frequency and Duration min 2x/week  2 weeks      SLP Evaluation Cognition  Overall Cognitive Status: Impaired/Different from baseline Orientation Level: Oriented to person;Oriented to place;Oriented to time;Oriented to situation Year: 2025 Month: February Day of Week: Incorrect Attention: Focused;Sustained Focused Attention: Impaired Sustained Attention: Impaired Memory: Impaired Memory Impairment: Decreased short term memory Decreased Short Term Memory: Functional basic;Verbal basic Problem Solving: Appears intact Executive Function: Reasoning;Organizing Organizing: Impaired Organizing Impairment: Functional basic       Comprehension  Auditory Comprehension Overall Auditory Comprehension: Appears within functional limits for tasks assessed Commands: Within Functional Limits Conversation: Complex EffectiveTechniques: Extra processing time Visual Recognition/Discrimination Discrimination: Not tested Reading Comprehension Reading Status: Not tested    Expression Verbal Expression Level of Generative/Spontaneous Verbalization: Conversation Repetition: No impairment Naming: No impairment Pragmatics: No impairment Written Expression Dominant Hand: Right Written Expression: Not tested   Oral / Motor  Motor Speech Overall Motor Speech: Appears within functional limits for tasks assessed (Reports lower vocal intensity) Respiration: Within functional limits Phonation: Normal Resonance: Within functional limits Articulation: Within functional limitis  Intelligibility:  Intelligible Motor Planning: Witnin functional limits Motor Speech Errors: Not applicable            Kerrie Pleasure, MA, CCC-SLP Acute Rehabilitation Services Office: [phone] 06/02/2023, 1:13 PM

## 2023-06-02 NOTE — Progress Notes (Signed)
   05/31/23 1200  SLP Visit Information  SLP Received On 05/31/23  General Information  HPI 73yo M admitted 2/1 after fall at home and diarrhea.  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 CT scan of the abdomen and pelvis shows diffuse colitis.  He has a small fluid collection near the pancreatico jejunal anastomosis. Poor po intake. Possible plan for feeding tube.  PMH: anxiety, DM  Type of Study Bedside Swallow Evaluation  Diet Prior to this Study Regular;Thin liquids (Level 0)  Temperature Spikes Noted No  Respiratory Status Room air  History of Recent Intubation No  Behavior/Cognition Alert;Cooperative  Oral Cavity Assessment WFL  Oral Care Completed by SLP No  Oral Cavity - Dentition Adequate natural dentition  Vision Functional for self-feeding  Self-Feeding Abilities Able to feed self  Patient Positioning Upright in bed  Baseline Vocal Quality Normal  Oral Motor/Sensory Function  Overall Oral Motor/Sensory Function WFL  Thin Liquid  Thin Liquid WFL  Nectar Thick Liquid  Nectar Thick Liquid NT  Honey Thick Liquid  Honey Thick Liquid NT  Puree  Puree NT  Solid  Solid WFL  Presentation Self Fed  SLP Assessment  Clinical Impression Statement (ACUTE ONLY) SLP ordered for cognition, but SLP mentioned in chart in conjunction with poor oral intake. OT addressed cognition yesterday with poor pt performance. Pt primarily reports complaint of a "mucous attack" at night that has been happening recently. He does report sensation of heartburn and also sleeps flat in bed. He also lists recently eating some foods that may be difficult for him to digest and could also trigger reflux, like fried fish. Posted a simple reflux precautions sign in the room, reported some findings to dietitian. No need for texture modification. Suggest MD consider further medical management for reflux, potentially am/pm, if warranted. No further SLP intervention needed now.   Swallow Evaluation Recommendations  SLP Diet Recommendations Regular;Thin liquid  Treatment Plan  Treatment Recommendations No treatment recommended at this time  Follow Up Recommendations No SLP follow up  SLP Time Calculation  SLP Start Time (ACUTE ONLY) 1100  SLP Stop Time (ACUTE ONLY) 1111  SLP Time Calculation (min) (ACUTE ONLY) 11 min  SLP Evaluations  $ SLP Speech Visit 1 Visit  SLP Evaluations  $BSS Swallow 1 Procedure

## 2023-06-02 NOTE — Plan of Care (Signed)
   Problem: Coping: Goal: Ability to adjust to condition or change in health will improve Outcome: Progressing   Problem: Fluid Volume: Goal: Ability to maintain a balanced intake and output will improve Outcome: Progressing   Problem: Nutritional: Goal: Maintenance of adequate nutrition will improve Outcome: Progressing Goal: Progress toward achieving an optimal weight will improve Outcome: Progressing

## 2023-06-03 LAB — GLUCOSE, CAPILLARY
Glucose-Capillary: 234 mg/dL — ABNORMAL HIGH (ref 70–99)
Glucose-Capillary: 390 mg/dL — ABNORMAL HIGH (ref 70–99)
Glucose-Capillary: 168 mg/dL — ABNORMAL HIGH (ref 70–99)
Glucose-Capillary: 137 mg/dL — ABNORMAL HIGH (ref 70–99)

## 2023-06-03 LAB — CREATININE, SERUM
Creatinine, Ser: 0.78 mg/dL (ref 0.61–1.24)
GFR, Estimated: >60 mL/min (ref >60)

## 2023-06-03 MED ORDER — GERHARDT'S BUTT CREAM
TOPICAL_CREAM | Freq: Two times a day (BID) | CUTANEOUS | Status: DC
  Filled 2023-06-03 (×2): qty 60

## 2023-06-03 MED ORDER — GLYCERIN (LAXATIVE) 2 G RE SUPP
1.0000 | Freq: Once | RECTAL | Status: AC
Start: 2023-06-03 — End: 2023-06-03
  Administered 2023-06-03: 1 via RECTAL
  Filled 2023-06-03: qty 1

## 2023-06-03 NOTE — Progress Notes (Signed)
   06/03/23 1349  Mobility  Activity Refused mobility  Mobility Specialist Start Time (ACUTE ONLY) 1349  Mobility Specialist Stop Time (ACUTE ONLY) 1350  Mobility Specialist Time Calculation (min) (ACUTE ONLY) 1 min   Mobility Specialist: Progress Note  Pt refused mobility session d/t "feeling tired from eating" - Received and left in bed with all needs met. Call bell within reach. This MS will not follow up today, placed as a priority for mobility tomorrow.   Barnie Mort, BS Mobility Specialist Please contact via [employer] or Rehab office at [phone].

## 2023-06-03 NOTE — Progress Notes (Signed)
 Subjective/Chief Complaint: Patient has increased his oral intake.  He ate 75% of spaghetti yesterday, all of his fruit cup and magic cup.  Ate 50% of omelet today and all of his fruit cup and 2 juices.  Working with therapies.  SLP saw patient yesterday as well.  Still no BM  Objective: Vital signs in last 24 hours: Temp:  [97.7 F (36.5 C)-98.5 F (36.9 C)] 97.8 F (36.6 C) (02/22 0753) Pulse Rate:  [81-83] 83 (02/22 0753) Resp:  [14-16] 14 (02/22 0753) BP: (119-148)/(64-85) 137/84 (02/22 0753) SpO2:  [98 %-100 %] 99 % (02/22 0753) Weight:  [68.3 kg] 68.3 kg (02/22 0500) Last BM Date : 05/29/23  Intake/Output from previous day: 02/21 0701 - 02/22 0700 In: 598.5 [P.O.:598.5] Out: 1710 [Urine:1600; Drains:110] Intake/Output this shift: No intake/output data recorded.  General:  alert and oriented.  Abdomen:  soft non distended, non tender Drain cloudy serosang. Periodically not holding suction, but would definitely leave in place.    Lab Results:  No results for input(s): "WBC", "HGB", "HCT", "PLT" in the last 72 hours.  BMET Recent Labs    06/03/23 0437  CREATININE 0.78    PT/INR No results for input(s): "LABPROT", "INR" in the last 72 hours. ABG No results for input(s): "PHART", "HCO3" in the last 72 hours.  Invalid input(s): "PCO2", "PO2"  Studies/Results: No results found.   Anti-infectives: Anti-infectives (From admission, onward)    Start     Dose/Rate Route Frequency Ordered Stop   05/18/23 0000  ciprofloxacin (CIPRO) 500 MG tablet        500 mg Oral 2 times daily 05/18/23 1250     05/18/23 0000  metroNIDAZOLE (FLAGYL) 500 MG tablet        500 mg Oral Every 12 hours 05/18/23 1250     05/17/23 2000  ciprofloxacin (CIPRO) tablet 500 mg        500 mg Oral 2 times daily 05/17/23 1128 05/20/23 2116   05/17/23 2000  metroNIDAZOLE (FLAGYL) tablet 500 mg        500 mg Oral Every 12 hours 05/17/23 1128 05/20/23 2116   05/14/23 0245  ciprofloxacin (CIPRO) IVPB  400 mg  Status:  Discontinued        400 mg 200 mL/hr over 60 Minutes Intravenous Every 12 hours 05/14/23 0227 05/17/23 1128   05/14/23 0245  metroNIDAZOLE (FLAGYL) IVPB 500 mg  Status:  Discontinued        500 mg 100 mL/hr over 60 Minutes Intravenous Every 12 hours 05/14/23 0227 05/17/23 1128       Assessment/Plan: 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    Readmitted with colitis and fall.   No apparent injury from fall IV antibiotics for colitis, switched to oral. 7 d total C diff negative as well as stool pathogen panel. Respiratory panel negative.     Lovenox for vte ppx IV fluids for diarrhea/rehydration, off now.  Rehydrated.  Diarrhea resolved.  Now appears constipated.  Decreased creon.  Add one time dulcolax 2/21. Will give glycerin suppository today  Insulin for DM, discussed with diabetes coordinator.  Adjusted insulin.  Cbgs remain in mid 200s   Soft diet. Megace for appetite stimulation and protein calorie malnutrition. Albumin <3, but prealbumin 23.  See how po intake is over weekend and reevaluate weight Monday AM.  If not stable, will ask IR for G tube, but is very motivated thus far to avoiding a G-tube  Drain amylase elevated c/w panc leak. Plan d/c with drain.    PT/OT consult.  TOC consult. SLP assessment indicates as well patient would benefit from post acute rehab, CIR vs SNF  SNF placement was rejected by insurance, but patient had at least one additional fall and is periodically having some buckling of legs while walking, especially while fatigued. Was NOT ABLE to do additional stair training due to this weakness.    This patient has objective data that make it not possible to return home right now.    Dispo:  await placement.  Still feel that patient is unsafe for home d/c based on intermittent weakness/falls, poor judgment issues leading to unsafe situations, and need for  more help with mobility than is available at home.   Immediate issues - possible g tube for poor po intake.       LOS: 21 days    Letha Cape 06/03/2023

## 2023-06-03 NOTE — Plan of Care (Signed)

## 2023-06-04 LAB — GLUCOSE, CAPILLARY
Glucose-Capillary: 188 mg/dL — ABNORMAL HIGH (ref 70–99)
Glucose-Capillary: 266 mg/dL — ABNORMAL HIGH (ref 70–99)
Glucose-Capillary: 213 mg/dL — ABNORMAL HIGH (ref 70–99)
Glucose-Capillary: 109 mg/dL — ABNORMAL HIGH (ref 70–99)

## 2023-06-04 NOTE — Plan of Care (Signed)

## 2023-06-04 NOTE — Plan of Care (Signed)

## 2023-06-04 NOTE — Progress Notes (Signed)
 Subjective/Chief Complaint: Continues with improved eating.  Calorie count in place.  Walked 130 ft with mobility specialist yesterday, but had to be wheeled back.  BM with glycerin suppository yesterday  Objective: Vital signs in last 24 hours: Temp:  [98 F (36.7 C)-99.7 F (37.6 C)] 98 F (36.7 C) (02/23 0755) Pulse Rate:  [82-91] 91 (02/23 0755) Resp:  [16-18] 18 (02/23 0755) BP: (135-142)/(81-91) 142/87 (02/23 0755) SpO2:  [98 %-100 %] 98 % (02/23 0755) Weight:  [70.2 kg] 70.2 kg (02/23 0700) Last BM Date : 05/29/23  Intake/Output from previous day: 02/22 0701 - 02/23 0700 In: 600 [P.O.:600] Out: 110 [Drains:110] Intake/Output this shift: Total I/O In: 300 [P.O.:300] Out: 40 [Drains:40]  General:  alert and oriented.  Abdomen:  soft non distended, non tender Drain cloudy serosang.   Lab Results:  No results for input(s): "WBC", "HGB", "HCT", "PLT" in the last 72 hours.  BMET Recent Labs    06/03/23 0437  CREATININE 0.78    PT/INR No results for input(s): "LABPROT", "INR" in the last 72 hours. ABG No results for input(s): "PHART", "HCO3" in the last 72 hours.  Invalid input(s): "PCO2", "PO2"  Studies/Results: No results found.   Anti-infectives: Anti-infectives (From admission, onward)    Start     Dose/Rate Route Frequency Ordered Stop   05/18/23 0000  ciprofloxacin (CIPRO) 500 MG tablet        500 mg Oral 2 times daily 05/18/23 1250     05/18/23 0000  metroNIDAZOLE (FLAGYL) 500 MG tablet        500 mg Oral Every 12 hours 05/18/23 1250     05/17/23 2000  ciprofloxacin (CIPRO) tablet 500 mg        500 mg Oral 2 times daily 05/17/23 1128 05/20/23 2116   05/17/23 2000  metroNIDAZOLE (FLAGYL) tablet 500 mg        500 mg Oral Every 12 hours 05/17/23 1128 05/20/23 2116   05/14/23 0245  ciprofloxacin (CIPRO) IVPB 400 mg  Status:  Discontinued        400 mg 200 mL/hr over 60 Minutes Intravenous Every 12 hours 05/14/23 0227 05/17/23 1128   05/14/23 0245   metroNIDAZOLE (FLAGYL) IVPB 500 mg  Status:  Discontinued        500 mg 100 mL/hr over 60 Minutes Intravenous Every 12 hours 05/14/23 0227 05/17/23 1128       Assessment/Plan: 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    Readmitted with colitis and fall.   No apparent injury from fall IV antibiotics for colitis, switched to oral. 7 d total C diff negative as well as stool pathogen panel. Respiratory panel negative.     Lovenox for vte ppx IV fluids for diarrhea/rehydration, off now.  Rehydrated.  Diarrhea resolved.  Now appears constipated.  Decreased creon.  Add one time dulcolax 2/21. 1BM with glycerin suppository 2/22  Insulin for DM, discussed with diabetes coordinator.  Adjusted insulin.  Cbgs range from 140s-390, but averaging in 200s   Soft diet. Megace for appetite stimulation and protein calorie malnutrition. Albumin <3, but prealbumin 23.  See how po intake is over weekend and reevaluate weight Monday AM.  If not stable, will ask IR for G tube, but is very motivated thus far to avoiding a G-tube   Drain amylase elevated c/w panc leak. Plan d/c with drain.    PT/OT consult.  TOC consult. SLP assessment indicates as well patient would benefit  from post acute rehab, CIR vs SNF  SNF placement was rejected by insurance, but patient had at least one additional fall and is periodically having some buckling of legs while walking, especially while fatigued. Was NOT ABLE to do additional stair training due to this weakness.    This patient has objective data that make it not possible to return home right now.    Dispo:  await placement.  Still feel that patient is unsafe for home d/c based on intermittent weakness/falls, poor judgment issues leading to unsafe situations, and need for more help with mobility than is available at home.   Immediate issues - possible g tube for poor po intake.       LOS: 22  days    Letha Cape 06/04/2023

## 2023-06-04 NOTE — Progress Notes (Addendum)
 Mobility Specialist Progress Note:   06/04/23 1229  Mobility  Activity Ambulated with assistance in hallway  Level of Assistance  (MinG)  Assistive Device Front wheel walker  Distance Ambulated (ft) 175 ft (87.5+87.5)  Activity Response Tolerated well  Mobility Referral Yes  Mobility visit 1 Mobility  Mobility Specialist Start Time (ACUTE ONLY) 1044  Mobility Specialist Stop Time (ACUTE ONLY) 1100  Mobility Specialist Time Calculation (min) (ACUTE ONLY) 16 min   Pt received in bed, agreeable to mobility. SB bed mobility. MinG during ambulation + chair follow for safety. Seated rest break required d/t leg weakness and fatigue. Pt needing verbal cues for upright posture and direction with RW, otherwise asx throughout. Pt declining to sit up in chair after session and returned to bed with call bell in reach and all needs met. Wife present in room.   Leory Plowman  Mobility Specialist Please contact via [employer] office at [phone]

## 2023-06-05 LAB — GLUCOSE, CAPILLARY
Glucose-Capillary: 158 mg/dL — ABNORMAL HIGH (ref 70–99)
Glucose-Capillary: 252 mg/dL — ABNORMAL HIGH (ref 70–99)
Glucose-Capillary: 168 mg/dL — ABNORMAL HIGH (ref 70–99)
Glucose-Capillary: 159 mg/dL — ABNORMAL HIGH (ref 70–99)

## 2023-06-05 MED ORDER — INSULIN GLARGINE 100 UNIT/ML ~~LOC~~ SOLN
12.0000 [IU] | Freq: Every day | SUBCUTANEOUS | Status: DC
  Administered 2023-06-06 – 2023-06-09 (×4): 12 [IU] via SUBCUTANEOUS
  Filled 2023-06-05 (×4): qty 0.12

## 2023-06-05 NOTE — Progress Notes (Signed)
 Subjective/Chief Complaint: Had an issue with one of the staff over the weekend. Eating is better.    Objective: Vital signs in last 24 hours: Temp:  [98 F (36.7 C)-98.9 F (37.2 C)] 98.9 F (37.2 C) (02/24 0731) Pulse Rate:  [81-90] 82 (02/24 0731) Resp:  [16-19] 16 (02/24 0731) BP: (126-142)/(63-94) 142/94 (02/24 0731) SpO2:  [98 %-99 %] 99 % (02/24 0731) Last BM Date : 06/03/23  Intake/Output from previous day: 02/23 0701 - 02/24 0700 In: 420 [P.O.:420] Out: 120 [Drains:120] Intake/Output this shift: No intake/output data recorded.  General:  alert and oriented.  Abdomen:  soft non distended, non tender Drain cloudy serosang.   Lab Results:  No results for input(s): "WBC", "HGB", "HCT", "PLT" in the last 72 hours.  BMET Recent Labs    06/03/23 0437  CREATININE 0.78    PT/INR No results for input(s): "LABPROT", "INR" in the last 72 hours. ABG No results for input(s): "PHART", "HCO3" in the last 72 hours.  Invalid input(s): "PCO2", "PO2"  Studies/Results: No results found.   Anti-infectives: Anti-infectives (From admission, onward)    Start     Dose/Rate Route Frequency Ordered Stop   05/18/23 0000  ciprofloxacin (CIPRO) 500 MG tablet        500 mg Oral 2 times daily 05/18/23 1250     05/18/23 0000  metroNIDAZOLE (FLAGYL) 500 MG tablet        500 mg Oral Every 12 hours 05/18/23 1250     05/17/23 2000  ciprofloxacin (CIPRO) tablet 500 mg        500 mg Oral 2 times daily 05/17/23 1128 05/20/23 2116   05/17/23 2000  metroNIDAZOLE (FLAGYL) tablet 500 mg        500 mg Oral Every 12 hours 05/17/23 1128 05/20/23 2116   05/14/23 0245  ciprofloxacin (CIPRO) IVPB 400 mg  Status:  Discontinued        400 mg 200 mL/hr over 60 Minutes Intravenous Every 12 hours 05/14/23 0227 05/17/23 1128   05/14/23 0245  metroNIDAZOLE (FLAGYL) IVPB 500 mg  Status:  Discontinued        500 mg 100 mL/hr over 60 Minutes Intravenous Every 12 hours 05/14/23 0227 05/17/23 1128        Assessment/Plan: 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    Readmitted with colitis and fall.   No apparent injury from fall IV antibiotics for colitis, switched to oral. 7 d total C diff negative as well as stool pathogen panel. Respiratory panel negative.     Lovenox for vte ppx IV fluids for diarrhea/rehydration, off now.  Rehydrated.  Diarrhea resolved.  Now appears constipated.  Decreased creon.  Oral dulcolax and then suppository required for stool.   Insulin for DM, discussed with diabetes coordinator.  Adjusted insulin.  Cbgs range from 140s-390, but averaging in 200s   Soft diet. Megace for appetite stimulation and protein calorie malnutrition. Albumin <3, but prealbumin 23.  PO intake increased over the weekend to 78% of caloric needs.     Drain amylase elevated c/w panc leak. Plan d/c with drain.    PT/OT consult.  TOC consult. SLP assessment indicates as well patient would benefit from post acute rehab, CIR vs SNF  SNF placement was rejected by insurance, but patient had at least one additional fall and is periodically having some buckling of legs while walking, especially while fatigued. Was NOT ABLE to do additional stair training due to  this weakness.    This patient has objective data that make it not possible to return home right now.    Dispo:  await placement.  Plan for resubmission for SNF discharge.  Still feel that patient is unsafe for home d/c based on intermittent weakness/falls, poor judgment issues leading to unsafe situations, and need for more help with mobility than is available at home.      LOS: 23 days    [name] 06/05/2023

## 2023-06-05 NOTE — Progress Notes (Addendum)
 Calorie Count Note  48 hour calorie count ordered.  Diet: Regular Supplements: Magic Cup  Day 1: 2/22 Lunch: 183 kcal, 7 g protein 2/22 Dinner: 192 kcal, 7 g protein Supplements: 2 Magic Cups, 580 kcal, 18 g protein Total: 955 kcal (45% of minimum estimated needs), 32 g protein (30% of estimated needs) Missing breakfast calorie count for this day. Kcal inaccurate for day  Day 2: 2/23 Breakfast: 436 kcal, 16 g protein 2/23 Dinner: 344 kcal, 17 g protein Supplements: 1 Magic cup 290 kcal, 9g protein Total: 1,070 kcal (51%), 42 g protein (40% of estimated needs) Missing lunch calorie count for this day. Kcal inaccurate for day  Pt sleeping upon visit and waved me off when tried to wake him up. Took meal tickets from door envelope. No nutrition supplements except magic cup were noted on meal tickets. Unsure if pt is still taking Boost Plus. Per secure chat with RN, unsure if he has been taking Boost or not. Per order review, pt has only refused one Boost plus in the past few days. CBGs seem to be slightly improving. Pt missing one meal ticket for each day of calorie count. However, he is meeting at least 50% of needs through 2 meals daily. Estimate that with another meal completion or supplement, he is exceeding 60% of estimated needs daily.   Nutrition Dx:  Severe Malnutrition related to poor appetite (malabsorption) as evidenced by moderate fat depletion, severe fat depletion, severe muscle depletion, energy intake < or equal to 50% for > or equal to 5 days, percent weight loss.   Being addressed   Goal:  Patient will meet greater than or equal to 90% of their needs   Not Met  Monitor:  PO intake, Supplement acceptance, Weight trends, Labs   Maceo Pro, MS Dietetic Intern

## 2023-06-05 NOTE — Progress Notes (Signed)
 Physical Therapy Treatment Patient Details Name: Andrew Tanner MRN: [id number] DOB: 11-14-1950 Today's Date: 06/05/2023   History of Present Illness 72yo M admitted 2/1 after fall at home and diarrhea.  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 CT scan of the abdomen and pelvis shows diffuse colitis.  He has a small fluid collection near the pancreatico jejunal anastomosis.  PMH: anxiety, DM    PT Comments  Patient seen laying comfortably in supine in bed, legs crossed. Patient agreeable to participate with therapy. Today's session focused on improving functional strength needed for stair negotiation and balance. Upon supervision, the patient able to transition himself EOB while using HOB railings to to elevate trunk. Session began with toileting, ambulating to the bathroom toilet with his RW with CGA. Patient able to clean and wash hands at the sink without assistance from therapy, requiring only CGA to ensure his safety. Patient able to ambulate 169ft within the unit with verbal cues provided to maintain safe ambulation when using a RW. At some points during the session, patient bumped into obstacles with his RW and required cueing to maneuver around them. Patient performed therapeutic exercises, balance, and gait training activities to improve functional strength for mobility, static standing, and stairs. Acute care PT will continue to follow up to address the patient's remaining impairments.    If plan is discharge home, recommend the following: A little help with walking and/or transfers;A little help with bathing/dressing/bathroom;Assistance with cooking/housework;Assist for transportation;Help with stairs or ramp for entrance;Supervision due to cognitive status   Can travel by private vehicle     Yes  Equipment Recommendations  None recommended by PT    Recommendations for Other Services Rehab consult     Precautions / Restrictions  Precautions Precautions: Fall Precaution/Restrictions Comments: JP drain right side Restrictions Weight Bearing Restrictions Per Provider Order: No     Mobility  Bed Mobility Overal bed mobility: Needs Assistance Bed Mobility: Supine to Sit     Supine to sit: Supervision     General bed mobility comments: Increased time, uses HOB rails to elevate trunk, moves legs to EOB on his own    Transfers Overall transfer level: Needs assistance Equipment used: Rolling walker (2 wheels) Transfers: Sit to/from Stand Sit to Stand: Contact guard assist           General transfer comment: No requires provided, patient able to use bilateral UE support to push self from seaed position    Ambulation/Gait Ambulation/Gait assistance: Contact guard assist Gait Distance (Feet): 100 Feet Assistive device: Rolling walker (2 wheels) Gait Pattern/deviations: Step-through pattern, Decreased dorsiflexion - right, Decreased dorsiflexion - left Gait velocity: decreased     General Gait Details: Cues provided to avoid  bumping into obstacles within the unit   Stairs Stairs: Yes Stairs assistance: Contact guard assist Stair Management: Two rails, Alternating pattern, Forwards Number of Stairs: 5 General stair comments: Cues for holding on with both hands for increased external support and keeping foot fully on step. Demonstrates an alternating step pattern ascending > descending   Wheelchair Mobility     Tilt Bed    Modified Rankin (Stroke Patients Only)       Balance Overall balance assessment: Needs assistance Sitting-balance support: Feet supported Sitting balance-Leahy Scale: Fair     Standing balance support: Bilateral upper extremity supported, During functional activity, Reliant on assistive device for balance Standing balance-Leahy Scale: Poor  Communication Communication Communication: No apparent difficulties  Cognition  Arousal: Alert Behavior During Therapy: Flat affect                             Following commands: Intact Following commands impaired: Follows multi-step commands with increased time    Cueing Cueing Techniques: Verbal cues, Tactile cues  Exercises Other Exercises Other Exercises: Balance 3x10s ea: EO - both narrow base of support. No UE support. Introudced forward reaching cue to correct posterior weight shift Other Exercises: Standing Calf Stretch 3x10s ea bilaterally Other Exercises: Heel raises x10 bilaterally Other Exercises: Hip Flexion Taps on Steps x5 bilaterally Other Exercises: Mini Squats with UE support x5    General Comments General comments (skin integrity, edema, etc.): Session focused on cog. Pt showed improvements in cog, but decreased balance.      Pertinent Vitals/Pain Pain Assessment Pain Assessment: No/denies pain Pain Score: 0-No pain    Home Living                          Prior Function            PT Goals (current goals can now be found in the care plan section) Acute Rehab PT Goals PT Goal Formulation: With patient Time For Goal Achievement: 06/13/23 Potential to Achieve Goals: Good Progress towards PT goals: Progressing toward goals    Frequency    Min 1X/week      PT Plan      Co-evaluation              AM-PAC PT "6 Clicks" Mobility   Outcome Measure  Help needed turning from your back to your side while in a flat bed without using bedrails?: None Help needed moving from lying on your back to sitting on the side of a flat bed without using bedrails?: None Help needed moving to and from a bed to a chair (including a wheelchair)?: A Little Help needed standing up from a chair using your arms (e.g., wheelchair or bedside chair)?: A Little Help needed to walk in hospital room?: A Little Help needed climbing 3-5 steps with a railing? : A Little 6 Click Score: 20    End of Session Equipment Utilized  During Treatment: Gait belt Activity Tolerance: Patient tolerated treatment well Patient left: in bed;with bed alarm set;with call bell/phone within reach   PT Visit Diagnosis: Unsteadiness on feet (R26.81);Other abnormalities of gait and mobility (R26.89);Muscle weakness (generalized) (M62.81)     Time: 1610-9604 PT Time Calculation (min) (ACUTE ONLY): 34 min  Charges:    $Gait Training: 8-22 mins $Therapeutic Exercise: 8-22 mins PT General Charges $$ ACUTE PT VISIT: 1 Visit                     Doreen Beam, SPT   Gitty Osterlund 06/05/2023, 3:54 PM

## 2023-06-05 NOTE — Progress Notes (Signed)
 Occupational Therapy Treatment Patient Details Name: Andrew Tanner MRN: [id number] DOB: Jul 13, 1950 Today's Date: 06/05/2023   History of present illness 72yo M admitted 2/1 after fall at home and diarrhea.  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 CT scan of the abdomen and pelvis shows diffuse colitis.  He has a small fluid collection near the pancreatico jejunal anastomosis.  PMH: anxiety, DM   OT comments   Today, pt was able to complete a 2 step ADL task with min to mod assistance for balance. Pt ambulated to bathroom and chair with RW and min to mod assistance for balance. Pt demonstrated improvements in cog, but decreased awareness in deficits. OT is recommending <3 hours of rehab. Will continue to follow acutely to make improvements towards functional independence.      If plan is discharge home, recommend the following:  A little help with walking and/or transfers;Assistance with cooking/housework;Direct supervision/assist for medications management;Direct supervision/assist for financial management;Assist for transportation;Help with stairs or ramp for entrance;Supervision due to cognitive status;A lot of help with bathing/dressing/bathroom   Equipment Recommendations  Other (comment);BSC/3in1 (RW)    Recommendations for Other Services      Precautions / Restrictions Precautions Precautions: Fall Precaution/Restrictions Comments: JP drain right side Restrictions Weight Bearing Restrictions Per Provider Order: No       Mobility Bed Mobility Overal bed mobility: Needs Assistance Bed Mobility: Supine to Sit     Supine to sit: Supervision     General bed mobility comments: increased time, cueing for initation    Transfers Overall transfer level: Needs assistance Equipment used: Rolling walker (2 wheels) Transfers: Sit to/from Stand Sit to Stand: Min assist, From elevated surface           General transfer comment:  cueing for hand placement, posterior and lateral lean. incresed time/cueing with min assist to correct     Balance Overall balance assessment: Needs assistance Sitting-balance support: Feet supported Sitting balance-Leahy Scale: Fair Sitting balance - Comments: Sitting EOB pt able to maintain balance, but when reaching for feet, pt exhibited posterior lean Postural control: Posterior lean, Left lateral lean Standing balance support: Bilateral upper extremity supported, No upper extremity supported, During functional activity Standing balance-Leahy Scale: Poor Standing balance comment: min to mod assist d/t posterior and lateral leans during grooming at sink                           ADL either performed or assessed with clinical judgement   ADL Overall ADL's : Needs assistance/impaired Eating/Feeding: Modified independent;Sitting   Grooming: Wash/dry face;Wash/dry hands;Moderate assistance;Minimal assistance;Cueing for safety;Standing Grooming Details (indicate cue type and reason): Min to mod A for standing balance. Cues for safety with RW             Lower Body Dressing: Minimal assistance;Sitting/lateral leans   Toilet Transfer: Minimal assistance;Moderate assistance;Cueing for safety;Stand-pivot;Ambulation;Rolling walker (2 wheels) Toilet Transfer Details (indicate cue type and reason): Simulated in room, assistance to maintain balance         Functional mobility during ADLs: Minimal assistance;Moderate assistance;Rolling walker (2 wheels) General ADL Comments: cueing for safety, RW mgmt and attention to task    Extremity/Trunk Assessment Upper Extremity Assessment Upper Extremity Assessment: Generalized weakness   Lower Extremity Assessment Lower Extremity Assessment: Defer to PT evaluation        Vision   Vision Assessment?: No apparent visual deficits   Perception     Praxis  Communication Communication Communication: No apparent  difficulties   Cognition Arousal: Alert Behavior During Therapy: Flat affect Cognition: Cognition impaired     Awareness: Online awareness impaired, Intellectual awareness intact Memory impairment (select all impairments): Short-term memory, Working memory Attention impairment (select first level of impairment): Sustained attention Executive functioning impairment (select all impairments): Initiation, Sequencing, Organization, Reasoning, Problem solving OT - Cognition Comments: Pt followed 2 step simple commands for ADL task, but demonstrated slower processing.                 Following commands: Intact Following commands impaired: Follows multi-step commands with increased time      Cueing   Cueing Techniques: Verbal cues, Tactile cues  Exercises      Shoulder Instructions       General Comments Session focused on cog. Pt showed improvements in cog, but decreased balance.    Pertinent Vitals/ Pain       Pain Assessment Pain Assessment: No/denies pain Pain Score: 0-No pain  Home Living                                          Prior Functioning/Environment              Frequency  Min 1X/week        Progress Toward Goals  OT Goals(current goals can now be found in the care plan section)  Progress towards OT goals: Progressing toward goals  Acute Rehab OT Goals Patient Stated Goal: None stated OT Goal Formulation: With patient/family Time For Goal Achievement: 06/13/23 Potential to Achieve Goals: Good ADL Goals Pt Will Perform Grooming: with modified independence;sitting;standing Pt Will Perform Upper Body Dressing: with modified independence;sitting Pt Will Perform Lower Body Dressing: with modified independence;sit to/from stand Pt Will Transfer to Toilet: with modified independence;ambulating;bedside commode Pt Will Perform Toileting - Clothing Manipulation and hygiene: with modified independence;sitting/lateral leans;sit  to/from stand Additional ADL Goal #1: Pt will utilize compensatory techniques to recall and complete 3 step trail making task with no more than minimal cueing in a non distracting enviorment.  Plan      Co-evaluation                 AM-PAC OT "6 Clicks" Daily Activity     Outcome Measure   Help from another person eating meals?: None Help from another person taking care of personal grooming?: A Little Help from another person toileting, which includes using toliet, bedpan, or urinal?: A Little Help from another person bathing (including washing, rinsing, drying)?: A Lot Help from another person to put on and taking off regular upper body clothing?: A Little Help from another person to put on and taking off regular lower body clothing?: A Lot 6 Click Score: 17    End of Session Equipment Utilized During Treatment: Gait belt;Rolling walker (2 wheels)  OT Visit Diagnosis: Unsteadiness on feet (R26.81);Muscle weakness (generalized) (M62.81);Other symptoms and signs involving cognitive function   Activity Tolerance Patient tolerated treatment well   Patient Left in chair;with call bell/phone within reach;with chair alarm set;with family/visitor present   Nurse Communication          Time: 1610-9604 OT Time Calculation (min): 21 min  Charges: OT General Charges $OT Visit: 1 Visit OT Treatments $Self Care/Home Management : 8-22 mins  Ivor Messier, OT  Acute Rehabilitation Services Office [phone] Secure chat preferred   Ronnald Ramp  Kasee Hantz 06/05/2023, 1:52 PM

## 2023-06-06 LAB — CBC
HCT: 35.8 % — ABNORMAL LOW (ref 39.0–52.0)
Hemoglobin: 11.9 g/dL — ABNORMAL LOW (ref 13.0–17.0)
MCH: 29.1 pg (ref 26.0–34.0)
MCHC: 33.2 g/dL (ref 30.0–36.0)
MCV: 87.5 fL (ref 80.0–100.0)
Platelets: 314 10*3/uL (ref 150–400)
RBC: 4.09 MIL/uL — ABNORMAL LOW (ref 4.22–5.81)
RDW: 13.8 % (ref 11.5–15.5)
WBC: 11.6 10*3/uL — ABNORMAL HIGH (ref 4.0–10.5)
nRBC: 0 % (ref 0.0–0.2)

## 2023-06-06 LAB — COMPREHENSIVE METABOLIC PANEL
ALT: 103 U/L — ABNORMAL HIGH (ref 0–44)
AST: 63 U/L — ABNORMAL HIGH (ref 15–41)
Albumin: 2.4 g/dL — ABNORMAL LOW (ref 3.5–5.0)
Alkaline Phosphatase: 80 U/L (ref 38–126)
Anion gap: 8 (ref 5–15)
BUN: 19 mg/dL (ref 8–23)
CO2: 23 mmol/L (ref 22–32)
Calcium: 9.7 mg/dL (ref 8.9–10.3)
Chloride: 108 mmol/L (ref 98–111)
Creatinine, Ser: 0.82 mg/dL (ref 0.61–1.24)
GFR, Estimated: >60 mL/min (ref >60)
Glucose, Bld: 149 mg/dL — ABNORMAL HIGH (ref 70–99)
Potassium: 4.1 mmol/L (ref 3.5–5.1)
Sodium: 139 mmol/L (ref 135–145)
Total Bilirubin: 0.4 mg/dL (ref 0.0–1.2)
Total Protein: 5.8 g/dL — ABNORMAL LOW (ref 6.5–8.1)

## 2023-06-06 LAB — PREALBUMIN: Prealbumin: 15 mg/dL — ABNORMAL LOW (ref 18–38)

## 2023-06-06 LAB — GLUCOSE, CAPILLARY
Glucose-Capillary: 130 mg/dL — ABNORMAL HIGH (ref 70–99)
Glucose-Capillary: 170 mg/dL — ABNORMAL HIGH (ref 70–99)
Glucose-Capillary: 270 mg/dL — ABNORMAL HIGH (ref 70–99)
Glucose-Capillary: 248 mg/dL — ABNORMAL HIGH (ref 70–99)

## 2023-06-06 LAB — VITAMIN D 25 HYDROXY (VIT D DEFICIENCY, FRACTURES): Vit D, 25-Hydroxy: 40.27 ng/mL (ref 30–100)

## 2023-06-06 LAB — C-REACTIVE PROTEIN: CRP: 2.2 mg/dL — ABNORMAL HIGH (ref <1.0)

## 2023-06-06 NOTE — Plan of Care (Signed)
  Problem: Coping: Goal: Ability to adjust to condition or change in health will improve Outcome: Not Progressing   Problem: Fluid Volume: Goal: Ability to maintain a balanced intake and output will improve Outcome: Progressing   Problem: Nutritional: Goal: Maintenance of adequate nutrition will improve Outcome: Progressing   Problem: Skin Integrity: Goal: Risk for impaired skin integrity will decrease Outcome: Progressing

## 2023-06-06 NOTE — TOC Progression Note (Signed)
 Transition of Care [facility]) - Progression Note    Patient Details  Name: Andrew Tanner MRN: [id number] Date of Birth: Feb 21, 1951  [facility] CM/SW Contact  Lorri Frederick, LCSW Phone Number: 06/06/2023, 3:52 PM  Clinical Narrative:    CSW spoke with pt wife Andrew Tanner.  She has not received further communication about the second level of appeal from the letter that she sent to CSW on 2/21.  That appeal is still open.    CSW consulting [facility] leadership regarding potential new auth request to again request SNF auth.    1615: Second call from [place]: she just got the mail, receive notice that the appeal result "unfavorable" with information on requesting an administrative law review.  Expected Discharge Plan: Skilled Nursing Facility Barriers to Discharge: Continued Medical Work up, SNF Pending bed offer  Expected Discharge Plan and Services In-house Referral: Clinical Social Work   Post Acute Care Choice: Home Health Living arrangements for the past 2 months: Single Family Home                                       Social Determinants of Health (SDOH) Interventions SDOH Screenings   Food Insecurity: No Food Insecurity (05/14/2023)  Housing: Low Risk  (05/14/2023)  Transportation Needs: No Transportation Needs (05/14/2023)  Utilities: Not At Risk (05/14/2023)  Alcohol Screen: Low Risk  (10/19/2020)  Depression (PHQ2-9): Low Risk  (03/20/2023)  Financial Resource Strain: Low Risk  (03/16/2023)  Physical Activity: Sufficiently Active (03/16/2023)  Social Connections: Socially Integrated (05/14/2023)  Stress: No Stress Concern Present (03/16/2023)  Tobacco Use: Medium Risk (05/13/2023)  Health Literacy: Adequate Health Literacy (01/03/2023)    Readmission Risk Interventions    05/15/2023    8:24 AM  Readmission Risk Prevention Plan  Transportation Screening Complete  PCP or Specialist Appt within 3-5 Days Complete  HRI or Home Care Consult Complete

## 2023-06-06 NOTE — Progress Notes (Signed)
 Nutrition Follow-up  DOCUMENTATION CODES:   Severe malnutrition in context of acute illness/injury  INTERVENTION:  [employer] Breakfast Essentials TID, each packet mixed with 8 ounces of 2% milk provides 13 grams of protein and 260 calories. Ordered micronutrient labs: Vitamin C, Vitamin D, zinc, Vitamin B6, Niacin (B3), CRP Discontinue calorie count Continue regular diet. Room service with assist to ensure pt does not miss meals. Encouraged outside food to be brought in. Continue Magic cup BID with meals, each supplement provides 290 kcal and 9 grams of protein Continue Boost Breeze po TID, each supplement provides 250 kcal and 9 grams of protein. Encouraged supplementing with Boost Plus/Ensure Enlive PRN to increase protein intake Pt would likely still benefit from PEG tube placement for supplemental nutrition support.   NUTRITION DIAGNOSIS:   Severe Malnutrition related to poor appetite (malabsorption) as evidenced by moderate fat depletion, severe fat depletion, severe muscle depletion, energy intake < or equal to 50% for > or equal to 5 days, percent weight loss.  Being addressed  GOAL:   Patient will meet greater than or equal to 90% of their needs  Not met  MONITOR:   PO intake, Supplement acceptance, Weight trends, Labs  REASON FOR ASSESSMENT:   Consult Calorie Count  ASSESSMENT:   Pt admitted after fall at home and diarrhea and found to have diffuse colitis and small fluid collection near the pancreaticojejunal anastomosis. 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 Pt with PMH of DM, HLD, HTN  Pt is still awaiting SNF placement. Pt reports they resubmitted a claim to insurance and are just waiting now to figure out the plan moving forward.  CBGs have improved today. Pt had bowel movement this AM for first time since 2/22. Pt reports he is feeling better today but still seems visibly weak. He reports his eating has improved over the past few days and is eating  most of his meals. Observed breakfast tray which pt had consumed 3 slices of bacon and 1 cup of ice cream (290 kcal and 10 g of protein). Pt had one Ensure on tray which he did not consume.  He reports having 1-2 Boost Breeze a day and is not open to having Boost Plus/Ensure again. Pt open to trying [employer] instead.   Pt reports MD spoke with him about possible PEG tube placement a few days ago but plans to hold on placement at this time. Multiple calorie counts performed reflecting very limited meal intakes with majority of kcals and protein coming from magic cup and Boost Breeze. Given pt with increased nutrition needs, debility, and malnutrition, pt would likely still benefit from supplemental nutrition support. Question whether pt will be able to sustain adequate nutritional intake following discharge.   Throughout admission noticed increased dry and flaky patches on head and face. Question whether micronutrient deficiencies are a contributing factor. Spoke with pt about checking for micronutrient deficiencies and pt was agreeable to this.  Reviewed weight hx throughout admission which appears to have been declining initially. Noted weight has been increasing though question method of measurement. Given intake, do not expect this to be true weight gain.   Meds reviewed: Boost Breeze TID, Novolog 0-5 units at bedtime, Novolog 0-9 units TID with meals, Novolog 4 units TID with meals, Creon 36,000 units TID before meals, Creon 24,000 units PRN with snacks, MVI, Megace daily, Ensure Enlive PRN  Labs reviewed: CBGs range from 130-252 in last 24 hours   Diet Order:   Diet Order  Diet regular Room service appropriate? Yes; Fluid consistency: Thin  Diet effective now           Diet - low sodium heart healthy                   EDUCATION NEEDS:   Education needs have been addressed  Skin:  Skin Assessment: Skin Integrity Issues: Skin Integrity Issues::  Other (Comment) Other: Irritant dermititis-right buttocks  Last BM:  2/25-type 6  Height:   Ht Readings from Last 1 Encounters:  05/13/23 5\' 11"  (1.803 m)    Weight:   Wt Readings from Last 1 Encounters:  06/06/23 70.2 kg    Ideal Body Weight:  78.2 kg  BMI:  Body mass index is 21.59 kg/m.  Estimated Nutritional Needs:   Kcal:  2100-2300  Protein:  105-120 g  Fluid:  >/= 2 L    Maceo Pro, MS Dietetic Intern

## 2023-06-06 NOTE — Progress Notes (Signed)
 Mobility Specialist: Progress Note   06/06/23 1539  Mobility  Activity Transferred from chair to bed  Level of Assistance Contact guard assist, steadying assist  Assistive Device Front wheel walker  Distance Ambulated (ft) 5 ft  Activity Response Tolerated well  Mobility Referral Yes  Mobility visit 1 Mobility  Mobility Specialist Start Time (ACUTE ONLY) 1514  Mobility Specialist Stop Time (ACUTE ONLY) 1518  Mobility Specialist Time Calculation (min) (ACUTE ONLY) 4 min    Pt declined further ambulation but requested to get back to bed - received in chair. CG throughout. No complaints. Left in bed with all needs met, call bell in reach.   Maurene Capes Mobility Specialist Please contact via SecureChat or Rehab office at [phone]

## 2023-06-06 NOTE — Plan of Care (Signed)

## 2023-06-06 NOTE — Progress Notes (Signed)
 Subjective/Chief Complaint: Had another BM.  Drain output down.  Weight increased.    Objective: Vital signs in last 24 hours: Temp:  [98.1 F (36.7 C)-99 F (37.2 C)] 98.1 F (36.7 C) (02/25 0728) Pulse Rate:  [78-87] 86 (02/25 0728) Resp:  [16] 16 (02/25 0728) BP: (123-146)/(87-93) 142/90 (02/25 0728) SpO2:  [98 %-100 %] 98 % (02/25 0728) Weight:  [70.2 kg] 70.2 kg (02/25 0448) Last BM Date : 06/03/23  Intake/Output from previous day: 02/24 0701 - 02/25 0700 In: 120 [P.O.:120] Out: -  Intake/Output this shift: Total I/O In: 120 [P.O.:120] Out: -   General:  alert and oriented.  Abdomen:  soft non distended, non tender Drain cloudy serosang.   Lab Results:  Recent Labs    06/06/23 0645  WBC 11.6*  HGB 11.9*  HCT 35.8*  PLT 314    BMET Recent Labs    06/06/23 0645  NA 139  K 4.1  CL 108  CO2 23  GLUCOSE 149*  BUN 19  CREATININE 0.82  CALCIUM 9.7    PT/INR No results for input(s): "LABPROT", "INR" in the last 72 hours. ABG No results for input(s): "PHART", "HCO3" in the last 72 hours.  Invalid input(s): "PCO2", "PO2"  Studies/Results: No results found.   Anti-infectives: Anti-infectives (From admission, onward)    Start     Dose/Rate Route Frequency Ordered Stop   05/18/23 0000  ciprofloxacin (CIPRO) 500 MG tablet        500 mg Oral 2 times daily 05/18/23 1250     05/18/23 0000  metroNIDAZOLE (FLAGYL) 500 MG tablet        500 mg Oral Every 12 hours 05/18/23 1250     05/17/23 2000  ciprofloxacin (CIPRO) tablet 500 mg        500 mg Oral 2 times daily 05/17/23 1128 05/20/23 2116   05/17/23 2000  metroNIDAZOLE (FLAGYL) tablet 500 mg        500 mg Oral Every 12 hours 05/17/23 1128 05/20/23 2116   05/14/23 0245  ciprofloxacin (CIPRO) IVPB 400 mg  Status:  Discontinued        400 mg 200 mL/hr over 60 Minutes Intravenous Every 12 hours 05/14/23 0227 05/17/23 1128   05/14/23 0245  metroNIDAZOLE (FLAGYL) IVPB 500 mg  Status:  Discontinued         500 mg 100 mL/hr over 60 Minutes Intravenous Every 12 hours 05/14/23 0227 05/17/23 1128       Assessment/Plan: 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    Readmitted with colitis and fall.   No apparent injury from fall IV antibiotics for colitis, switched to oral. 7 d total C diff negative as well as stool pathogen panel. Respiratory panel negative.     Lovenox for vte ppx IV fluids for diarrhea/rehydration, off now.  Rehydrated.  Bowel regimen now.    Insulin for DM, CBGs better.     Soft diet. Megace for appetite stimulation and protein calorie malnutrition. Albumin <3, but prealbumin 23.  PO intake increased over the weekend to 78% of caloric needs.     Drain amylase elevated c/w panc leak. Plan d/c with drain.    PT/OT consult.  TOC consult. SLP assessment indicates as well patient would benefit from post acute rehab  SNF placement was rejected by insurance, but patient had at least one additional fall and is periodically having some buckling of legs while walking, especially while fatigued. Was NOT  ABLE to do additional stair training due to this weakness.    This patient has objective data that make it not possible to return home right now.    Dispo:  await placement.  Plan for resubmission for SNF discharge.  Still feel that patient is unsafe for home d/c based on intermittent weakness/falls, poor judgment issues leading to unsafe situations, and need for more help with mobility than is available at home.      LOS: 24 days    [name] 06/06/2023

## 2023-06-07 LAB — GLUCOSE, CAPILLARY
Glucose-Capillary: 158 mg/dL — ABNORMAL HIGH (ref 70–99)
Glucose-Capillary: 233 mg/dL — ABNORMAL HIGH (ref 70–99)
Glucose-Capillary: 242 mg/dL — ABNORMAL HIGH (ref 70–99)
Glucose-Capillary: 202 mg/dL — ABNORMAL HIGH (ref 70–99)

## 2023-06-07 NOTE — Progress Notes (Signed)
 Physical Therapy Treatment Patient Details Name: Andrew Tanner MRN: [id number] DOB: 23-Jan-1951 Today's Date: 06/07/2023   History of Present Illness 73 y/o M admitted 2/1 after fall at home and diarrhea.  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 CT scan of the abdomen and pelvis shows diffuse colitis. He has a small fluid collection near the pancreatic jejunal anastomosis. PMH: anxiety, DM.    PT Comments  Pt received in supine, pleasantly agreeable to therapy session and with good participation and tolerance for gait and transfer training. Pt needing up to minA for transfers and gait this date with rollator (device pt has at home) and with poor recall of rollator safety or parts management. Pt tending to bump into furniture in narrow areas of his room and has poor safety awareness when preparing to sit on toilet or chair, needing frequent reminders for UE placement/use of rollator brakes. Plan to work more on stairs/balance next session. Pt will continue to benefit from skilled rehab in a post acute setting to maximize functional gains before returning home.    If plan is discharge home, recommend the following: A little help with walking and/or transfers;A little help with bathing/dressing/bathroom;Assistance with cooking/housework;Assist for transportation;Help with stairs or ramp for entrance;Supervision due to cognitive status   Can travel by private vehicle     Yes  Equipment Recommendations  None recommended by PT (pt has rollator and shower seat or BSC at home)    Recommendations for Other Services Rehab consult     Precautions / Restrictions Precautions Precautions: Fall Recall of Precautions/Restrictions: Impaired (recent fall in hospital) Precaution/Restrictions Comments: JP drain right side Restrictions Weight Bearing Restrictions Per Provider Order: No     Mobility  Bed Mobility Overal bed mobility: Needs Assistance Bed  Mobility: Rolling, Sidelying to Sit Rolling: Supervision Sidelying to sit: Supervision       General bed mobility comments: Cues for log roll for abdominal comfort to sit on R EOB; use of bed rail/HOB elevated.    Transfers Overall transfer level: Needs assistance Equipment used: Rollator (4 wheels) Transfers: Sit to/from Stand Sit to Stand: Min assist           General transfer comment: minA initially to stand from EOB due to posterior instability, pt observed to be bracing BLE against bed frame for stability. Dense cues for use of rollator brakes and pt having difficulty managing brake lever so given hand over hand assist to reach for lever properly to push down. Pt also needing minA for safety to sit on lower toilet seat as he was attempting to sit before lining hips up properly and would have sat next to toilet without intervention.    Ambulation/Gait Ambulation/Gait assistance: Contact guard assist, Min assist Gait Distance (Feet): 125 Feet (21ft to bathroom, then 174ft x2 with seated rest break ~3 mins in PT gym between trials) Assistive device: Rollator (4 wheels) Gait Pattern/deviations: Step-through pattern, Decreased dorsiflexion - right, Decreased dorsiflexion - left, Trunk flexed, Narrow base of support Gait velocity: decreased Gait velocity interpretation: <1.31 ft/sec, indicative of household ambulator   General Gait Details: Cues provided to avoid  bumping into obstacles within the unit and within his room. Pt tending to flex forward more and holding rollator more advanced as he fatigues, needing frequent cues for posture and some minA for assistive device management/stability.   Stairs         General stair comments: defer stairs due to pt fatigue  Wheelchair Mobility     Tilt Bed    Modified Rankin (Stroke Patients Only)       Balance Overall balance assessment: Needs assistance Sitting-balance support: Feet supported Sitting balance-Leahy  Scale: Good Sitting balance - Comments: static sitting on mat table in PT gym and edge of bed   Standing balance support: Bilateral upper extremity supported, During functional activity, Reliant on assistive device for balance Standing balance-Leahy Scale: Poor Standing balance comment: up to minA when supported by rollator due to instability                            Communication Communication Communication: No apparent difficulties  Cognition Arousal: Alert Behavior During Therapy: Flat affect   PT - Cognitive impairments: Safety/Judgement, Sequencing, Memory                       PT - Cognition Comments: Pt with poor recall of use of rollator brakes, needs hand over hand assist to perform properly when cued. Decreased recall of safe UE placement with transfers, gait quality decreases as fatigue increases. Chair alarm on for safety at end of session while pt up in recliner to eat his dinner Following commands: Intact Following commands impaired: Only follows one step commands consistently    Cueing Cueing Techniques: Verbal cues, Tactile cues, Visual cues  Exercises Other Exercises Other Exercises: STS x 3 reps with hands on his knees    General Comments General comments (skin integrity, edema, etc.): BP 146/99 (108) HR 105 bpm sitting; BP 134/88 (104) standing after seated reading taken      Pertinent Vitals/Pain Pain Assessment Pain Assessment: No/denies pain Pain Intervention(s): Monitored during session, Repositioned    Home Living                          Prior Function            PT Goals (current goals can now be found in the care plan section) Acute Rehab PT Goals Patient Stated Goal: 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 PT Goal Formulation: With patient Time For Goal Achievement: 06/13/23 Progress towards PT goals: Progressing toward goals    Frequency    Min 1X/week      PT Plan      Co-evaluation               AM-PAC PT "6 Clicks" Mobility   Outcome Measure  Help needed turning from your back to your side while in a flat bed without using bedrails?: None Help needed moving from lying on your back to sitting on the side of a flat bed without using bedrails?: A Little (flat bed/no rails) Help needed moving to and from a bed to a chair (including a wheelchair)?: A Little Help needed standing up from a chair using your arms (e.g., wheelchair or bedside chair)?: A Little Help needed to walk in hospital room?: A Lot (mod safety cues) Help needed climbing 3-5 steps with a railing? : A Lot 6 Click Score: 17    End of Session Equipment Utilized During Treatment: Gait belt Activity Tolerance: Patient tolerated treatment well Patient left: with call bell/phone within reach;in chair;with chair alarm set;Other (comment) (set up to eat his dinner) Nurse Communication: Mobility status PT Visit Diagnosis: Unsteadiness on feet (R26.81);Other abnormalities of gait and mobility (R26.89);Muscle weakness (generalized) (M62.81)     Time: 1610-9604  PT Time Calculation (min) (ACUTE ONLY): 29 min  Charges:    $Gait Training: 8-22 mins $Therapeutic Activity: 8-22 mins PT General Charges $$ ACUTE PT VISIT: 1 Visit                     Junie Avilla P., PTA Acute Rehabilitation Services Secure Chat Preferred 9a-5:30pm Office: [phone]    Dorathy Kinsman [facility] 06/07/2023, 7:04 PM

## 2023-06-07 NOTE — Plan of Care (Signed)

## 2023-06-07 NOTE — TOC Progression Note (Signed)
 Transition of Care [facility]) - Progression Note    Patient Details  Name: Andrew Tanner MRN: [id number] Date of Birth: May 05, 1950  Transition of Care [facility] CM/SW Contact  Lorri Frederick, LCSW Phone Number: 06/07/2023, 12:56 PM  Clinical Narrative:   CSW advised by [facility] leadership to refile auth request after checking if can do this with appeal still open.  Appeal is no longer open.   SNF auth request resubmitted in [place].    Expected Discharge Plan: Skilled Nursing Facility Barriers to Discharge: Continued Medical Work up, SNF Pending bed offer  Expected Discharge Plan and Services In-house Referral: Clinical Social Work   Post Acute Care Choice: Home Health Living arrangements for the past 2 months: Single Family Home                                       Social Determinants of Health (SDOH) Interventions SDOH Screenings   Food Insecurity: No Food Insecurity (05/14/2023)  Housing: Low Risk  (05/14/2023)  Transportation Needs: No Transportation Needs (05/14/2023)  Utilities: Not At Risk (05/14/2023)  Alcohol Screen: Low Risk  (10/19/2020)  Depression (PHQ2-9): Low Risk  (03/20/2023)  Financial Resource Strain: Low Risk  (03/16/2023)  Physical Activity: Sufficiently Active (03/16/2023)  Social Connections: Socially Integrated (05/14/2023)  Stress: No Stress Concern Present (03/16/2023)  Tobacco Use: Medium Risk (05/13/2023)  Health Literacy: Adequate Health Literacy (01/03/2023)    Readmission Risk Interventions    05/15/2023    8:24 AM  Readmission Risk Prevention Plan  Transportation Screening Complete  PCP or Specialist Appt within 3-5 Days Complete  HRI or Home Care Consult Complete

## 2023-06-07 NOTE — Progress Notes (Signed)
 Subjective/Chief Complaint: No complaints. Ate most of breakfast.    Objective: Vital signs in last 24 hours: Temp:  [97.7 F (36.5 C)-98.8 F (37.1 C)] 98.7 F (37.1 C) (02/26 0733) Pulse Rate:  [84-88] 87 (02/26 0733) Resp:  [14-17] 16 (02/26 0733) BP: (133-157)/(78-83) 136/82 (02/26 0733) SpO2:  [99 %-100 %] 100 % (02/26 0733) Last BM Date : 06/06/23  Intake/Output from previous day: 02/25 0701 - 02/26 0700 In: 360 [P.O.:360] Out: 255 [Urine:100; Drains:155] Intake/Output this shift: No intake/output data recorded.  General:  alert and oriented. In bed this AM.  Abdomen:  soft non distended, non tender Drain cloudy serosang.   Lab Results:  Recent Labs    06/06/23 0645  WBC 11.6*  HGB 11.9*  HCT 35.8*  PLT 314    BMET Recent Labs    06/06/23 0645  NA 139  K 4.1  CL 108  CO2 23  GLUCOSE 149*  BUN 19  CREATININE 0.82  CALCIUM 9.7    PT/INR No results for input(s): "LABPROT", "INR" in the last 72 hours. ABG No results for input(s): "PHART", "HCO3" in the last 72 hours.  Invalid input(s): "PCO2", "PO2"  Studies/Results: No results found.   Anti-infectives: Anti-infectives (From admission, onward)    Start     Dose/Rate Route Frequency Ordered Stop   05/18/23 0000  ciprofloxacin (CIPRO) 500 MG tablet        500 mg Oral 2 times daily 05/18/23 1250     05/18/23 0000  metroNIDAZOLE (FLAGYL) 500 MG tablet        500 mg Oral Every 12 hours 05/18/23 1250     05/17/23 2000  ciprofloxacin (CIPRO) tablet 500 mg        500 mg Oral 2 times daily 05/17/23 1128 05/20/23 2116   05/17/23 2000  metroNIDAZOLE (FLAGYL) tablet 500 mg        500 mg Oral Every 12 hours 05/17/23 1128 05/20/23 2116   05/14/23 0245  ciprofloxacin (CIPRO) IVPB 400 mg  Status:  Discontinued        400 mg 200 mL/hr over 60 Minutes Intravenous Every 12 hours 05/14/23 0227 05/17/23 1128   05/14/23 0245  metroNIDAZOLE (FLAGYL) IVPB 500 mg  Status:  Discontinued        500 mg 100 mL/hr  over 60 Minutes Intravenous Every 12 hours 05/14/23 0227 05/17/23 1128       Assessment/Plan: 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    Readmitted with colitis and fall.   No apparent injury from fall IV antibiotics for colitis, switched to oral. 7 d total C diff negative as well as stool pathogen panel. Respiratory panel negative.     Lovenox for vte ppx IV fluids for diarrhea/rehydration, off now.  Rehydrated.    Insulin for DM, CBGs better.     Soft diet. Megace for appetite stimulation and protein calorie malnutrition. Albumin <3, but prealbumin 23. Prealbumin down a bit, but weight increased.     Drain amylase elevated c/w panc leak. Plan d/c with drain. It is functional.    PT/OT consult.  TOC consult. SLP assessment indicates as well patient would benefit from post acute rehab   This patient has objective data that make it not possible to return home safely at this juncture.   Dispo:  await placement.  Plan for resubmission for SNF discharge. SW/TOC team working diligently on this. Unclear state of appeal vs resubmission.  Still feel that patient is unsafe for home d/c based on intermittent weakness/falls, poor judgment issues leading to unsafe situations, and need for more help with mobility than is available at home.      LOS: 25 days    [name] 06/07/2023

## 2023-06-08 LAB — GLUCOSE, CAPILLARY
Glucose-Capillary: 223 mg/dL — ABNORMAL HIGH (ref 70–99)
Glucose-Capillary: 276 mg/dL — ABNORMAL HIGH (ref 70–99)
Glucose-Capillary: 154 mg/dL — ABNORMAL HIGH (ref 70–99)
Glucose-Capillary: 163 mg/dL — ABNORMAL HIGH (ref 70–99)

## 2023-06-08 LAB — ZINC: Zinc: 67 ug/dL (ref 44–115)

## 2023-06-08 LAB — VITAMIN B6: Vitamin B6: 5.2 ug/L (ref 3.4–65.2)

## 2023-06-08 MED ORDER — INSULIN ASPART 100 UNIT/ML IJ SOLN: 0.0000 [IU] | Freq: Every day | INTRAMUSCULAR | Status: DC

## 2023-06-08 MED ORDER — LANTUS SOLOSTAR 100 UNIT/ML ~~LOC~~ SOPN
12.0000 [IU] | PEN_INJECTOR | Freq: Every day | SUBCUTANEOUS | Status: DC
Start: 2023-06-08 — End: 2023-08-11

## 2023-06-08 MED ORDER — INSULIN ASPART 100 UNIT/ML IJ SOLN: 4.0000 [IU] | Freq: Three times a day (TID) | INTRAMUSCULAR | Status: DC

## 2023-06-08 MED ORDER — GERHARDT'S BUTT CREAM: 1.0000 | TOPICAL_CREAM | Freq: Two times a day (BID) | CUTANEOUS | Status: DC

## 2023-06-08 MED ORDER — ADULT MULTIVITAMIN W/MINERALS CH: 1.0000 | ORAL_TABLET | Freq: Every day | ORAL | Status: DC

## 2023-06-08 MED ORDER — INSULIN ASPART 100 UNIT/ML IJ SOLN: 0.0000 [IU] | Freq: Three times a day (TID) | INTRAMUSCULAR | Status: DC

## 2023-06-08 NOTE — Progress Notes (Addendum)
 Occupational Therapy Treatment Patient Details Name: Andrew Tanner MRN: [id number] DOB: March 23, 1951 Today's Date: 06/08/2023   History of present illness 73 y/o M admitted 2/1 after fall at home and diarrhea.  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 CT scan of the abdomen and pelvis shows diffuse colitis. He has a small fluid collection near the pancreatic jejunal anastomosis. PMH: anxiety, DM.   OT comments  Pt supine in bed, reports frustration with toileting in pull ups and inability to use urinal.  Provided larger urinal, pt agreeable to try this when needed.  Pt provided 3 items to retrieve (related to bathing ADL) before completing hygiene at sink, able to recall 2/3 over multiple attempts but never all 3 (even with visual cueing of looking at items).  Min cueing for safety, item recall but able to manage ADL sequencing with supervision.  Up to min assist for transfers, RM mgmt; min guard LB ADLs and toileting.  Bathing sitting on BSC at sink with min guard to supervision. Will follow acutely, continue to recommend <3hrs/day inpatient setting at dc.       If plan is discharge home, recommend the following:  A little help with walking and/or transfers;Assistance with cooking/housework;Direct supervision/assist for medications management;Direct supervision/assist for financial management;Assist for transportation;Help with stairs or ramp for entrance;Supervision due to cognitive status;A little help with bathing/dressing/bathroom   Equipment Recommendations  Other (comment);BSC/3in1 (RW)    Recommendations for Other Services      Precautions / Restrictions Precautions Precautions: Fall Recall of Precautions/Restrictions: Impaired Precaution/Restrictions Comments: JP drain right side Restrictions Weight Bearing Restrictions Per Provider Order: No       Mobility Bed Mobility Overal bed mobility: Needs Assistance Bed Mobility: Supine to  Sit       Sit to supine: Supervision   General bed mobility comments: no assist required, cueing for safety    Transfers Overall transfer level: Needs assistance Equipment used: Rolling walker (2 wheels) Transfers: Sit to/from Stand Sit to Stand: Min assist           General transfer comment: cueing for recall of hand placement, min assist to steady when standing     Balance Overall balance assessment: Needs assistance Sitting-balance support: Feet supported Sitting balance-Leahy Scale: Good     Standing balance support: No upper extremity supported, During functional activity, Bilateral upper extremity supported Standing balance-Leahy Scale: Poor Standing balance comment: min guard during ADLs, relies on RW                           ADL either performed or assessed with clinical judgement   ADL Overall ADL's : Needs assistance/impaired     Grooming: Contact guard assist;Standing               Lower Body Dressing: Contact guard assist;Sit to/from stand   Toilet Transfer: Ambulation;Minimal assistance;Rolling walker (2 wheels) Toilet Transfer Details (indicate cue type and reason): for safety, RW mgmt Toileting- Clothing Manipulation and Hygiene: Contact guard assist;Sit to/from stand       Functional mobility during ADLs: Minimal assistance;Rolling walker (2 wheels) General ADL Comments: pt requires cueing for safety with RW mgmt, recall and awareness of deficits. No LOB during session.    Extremity/Trunk Assessment              Diplomatic Services operational officer Communication Communication:  No apparent difficulties   Cognition Arousal: Alert Behavior During Therapy: Flat affect Cognition: Cognition impaired     Awareness: Intellectual awareness intact, Online awareness impaired Memory impairment (select all impairments): Short-term memory, Working memory Attention impairment (select first level of  impairment): Selective attention Executive functioning impairment (select all impairments): Sequencing, Problem solving, Organization OT - Cognition Comments: patient given 3 items to retrieve prior entering bathroom, testing immediately and after a few minutes and continues to recall 2/3 items throughout task.                 Following commands: Intact Following commands impaired: Follows one step commands with increased time, Follows multi-step commands inconsistently      Cueing   Cueing Techniques: Verbal cues, Tactile cues, Visual cues  Exercises      Shoulder Instructions       General Comments pt complains of inability to use urinal, provided larger urinal in hope to promote increased success and decreased frustration with wet pull ups    Pertinent Vitals/ Pain       Pain Assessment Pain Assessment: No/denies pain Pain Intervention(s): Monitored during session  Home Living                                          Prior Functioning/Environment              Frequency  Min 1X/week        Progress Toward Goals  OT Goals(current goals can now be found in the care plan section)  Progress towards OT goals: Progressing toward goals  Acute Rehab OT Goals Patient Stated Goal: get better OT Goal Formulation: With patient  Plan      Co-evaluation                 AM-PAC OT "6 Clicks" Daily Activity     Outcome Measure   Help from another person eating meals?: None Help from another person taking care of personal grooming?: A Little Help from another person toileting, which includes using toliet, bedpan, or urinal?: A Little Help from another person bathing (including washing, rinsing, drying)?: A Little Help from another person to put on and taking off regular upper body clothing?: A Little Help from another person to put on and taking off regular lower body clothing?: A Little 6 Click Score: 19    End of Session Equipment  Utilized During Treatment: Gait belt;Rolling walker (2 wheels)  OT Visit Diagnosis: Unsteadiness on feet (R26.81);Muscle weakness (generalized) (M62.81);Other symptoms and signs involving cognitive function   Activity Tolerance Patient tolerated treatment well   Patient Left in chair;with call bell/phone within reach;with chair alarm set;with nursing/sitter in room   Nurse Communication Mobility status        Time: 8756-4332 OT Time Calculation (min): 31 min  Charges: OT General Charges $OT Visit: 1 Visit OT Treatments $Self Care/Home Management : 23-37 mins  Barry Brunner, OT Acute Rehabilitation Services Office [phone] Secure Chat Preferred    Andrew Tanner 06/08/2023, 10:27 AM

## 2023-06-08 NOTE — Progress Notes (Signed)
 Mobility Specialist Progress Note:    06/08/23 1400  Mobility  Activity Ambulated with assistance in hallway  Level of Assistance Contact guard assist, steadying assist  Assistive Device Front wheel walker  Distance Ambulated (ft) 130 ft  Activity Response Tolerated well  Mobility Referral Yes  Mobility visit 1 Mobility  Mobility Specialist Start Time (ACUTE ONLY) 1337  Mobility Specialist Stop Time (ACUTE ONLY) 1355  Mobility Specialist Time Calculation (min) (ACUTE ONLY) 18 min   Received pt in bed having no complaints and agreeable to mobility. Pt was asymptomatic throughout ambulation and returned to room w/o fault. Left in bed w/ call bell in reach and all needs met.   D'Vante Earlene Plater Mobility Specialist Please contact via Special educational needs teacher or Rehab office at [phone]

## 2023-06-08 NOTE — Progress Notes (Signed)
 Subjective/Chief Complaint: Continues to work with therapies.  Improving eating slowly.  Has issues getting out of bed to use bathroom.  Feels "locked in."  Is having issues with the urinal provided or getting to the bathroom in time.  No diarrhea now.    Objective: Vital signs in last 24 hours: Temp:  [97.5 F (36.4 C)-99 F (37.2 C)] 98.4 F (36.9 C) (02/27 0732) Pulse Rate:  [62-94] 84 (02/27 0732) Resp:  [17] 17 (02/27 0732) BP: (123-176)/(79-93) 137/79 (02/27 0732) SpO2:  [97 %-99 %] 98 % (02/27 0732) Weight:  [70.1 kg] 70.1 kg (02/27 0500) Last BM Date : 06/06/23  Intake/Output from previous day: 02/26 0701 - 02/27 0700 In: 740 [P.O.:740] Out: 425 [Urine:400; Drains:25] Intake/Output this shift: Total I/O In: 240 [P.O.:240] Out: 50 [Drains:50]  General:  alert and oriented. Dry skin on face improving.   Abdomen:  soft non distended, non tender Drain cloudy serosang.   Lab Results:  Recent Labs    06/06/23 0645  WBC 11.6*  HGB 11.9*  HCT 35.8*  PLT 314    BMET Recent Labs    06/06/23 0645  NA 139  K 4.1  CL 108  CO2 23  GLUCOSE 149*  BUN 19  CREATININE 0.82  CALCIUM 9.7    PT/INR No results for input(s): "LABPROT", "INR" in the last 72 hours. ABG No results for input(s): "PHART", "HCO3" in the last 72 hours.  Invalid input(s): "PCO2", "PO2"  Studies/Results: No results found.   Anti-infectives: Anti-infectives (From admission, onward)    Start     Dose/Rate Route Frequency Ordered Stop   05/18/23 0000  ciprofloxacin (CIPRO) 500 MG tablet        500 mg Oral 2 times daily 05/18/23 1250     05/18/23 0000  metroNIDAZOLE (FLAGYL) 500 MG tablet        500 mg Oral Every 12 hours 05/18/23 1250     05/17/23 2000  ciprofloxacin (CIPRO) tablet 500 mg        500 mg Oral 2 times daily 05/17/23 1128 05/20/23 2116   05/17/23 2000  metroNIDAZOLE (FLAGYL) tablet 500 mg        500 mg Oral Every 12 hours 05/17/23 1128 05/20/23 2116   05/14/23 0245   ciprofloxacin (CIPRO) IVPB 400 mg  Status:  Discontinued        400 mg 200 mL/hr over 60 Minutes Intravenous Every 12 hours 05/14/23 0227 05/17/23 1128   05/14/23 0245  metroNIDAZOLE (FLAGYL) IVPB 500 mg  Status:  Discontinued        500 mg 100 mL/hr over 60 Minutes Intravenous Every 12 hours 05/14/23 0227 05/17/23 1128       Assessment/Plan: 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    Readmitted with colitis and fall.   No apparent injury from fall IV antibiotics for colitis, switched to oral. 7 d total C diff negative as well as stool pathogen panel. Respiratory panel negative.     Lovenox for vte ppx IV fluids for diarrhea/rehydration, off now.  Rehydrated.    Insulin for DM, CBGs better.     Soft diet. Megace for appetite stimulation and protein calorie malnutrition. Albumin <3, but prealbumin 23. Prealbumin down a bit, but weight increased.  Holding off on G tube given improved PO intake.     Drain amylase elevated c/w panc leak. Plan d/c with drain. It is functional.    PT/OT consult.  TOC  consult. SLP consult. Pt working with therapies.  Doing best with PT, but having issues with ADLs and judgment.    Dispo:  await placement.  Request resubmitted yesterday.    Still feel that patient is unsafe for home d/c based on intermittent weakness/falls, poor judgment issues leading to unsafe situations, and need for more help with mobility than is available at home.    This patient has objective data that make it not possible to return home safely at this juncture.    LOS: 26 days    [name] 06/08/2023

## 2023-06-08 NOTE — Care Management Important Message (Signed)
 Important Message  Patient Details  Name: Andrew Tanner MRN: [id number] Date of Birth: [birth year]   Important Message Given:  Yes - Medicare IM     [name] 06/08/2023, 12:34 PM

## 2023-06-08 NOTE — TOC Progression Note (Signed)
 Transition of Care [facility]) - Progression Note    Patient Details  Name: Andrew Tanner MRN: [id number] Date of Birth: 30-Aug-1950  Transition [facility] CM/SW Contact  Lorri Frederick, LCSW Phone Number: 06/08/2023, 1:55 PM  Clinical Narrative:   Message from [place] offering P2P by 5pm today: [phone], option 5.   MD informed by text.      Expected Discharge Plan: Skilled Nursing Facility Barriers to Discharge: Continued Medical Work up, SNF Pending bed offer  Expected Discharge Plan and Services In-house Referral: Clinical Social Work   Post Acute Care Choice: Home Health Living arrangements for the past 2 months: Single Family Home                                       Social Determinants of Health (SDOH) Interventions SDOH Screenings   Food Insecurity: No Food Insecurity (05/14/2023)  Housing: Low Risk  (05/14/2023)  Transportation Needs: No Transportation Needs (05/14/2023)  Utilities: Not At Risk (05/14/2023)  Alcohol Screen: Low Risk  (10/19/2020)  Depression (PHQ2-9): Low Risk  (03/20/2023)  Financial Resource Strain: Low Risk  (03/16/2023)  Physical Activity: Sufficiently Active (03/16/2023)  Social Connections: Socially Integrated (05/14/2023)  Stress: No Stress Concern Present (03/16/2023)  Tobacco Use: Medium Risk (05/13/2023)  Health Literacy: Adequate Health Literacy (01/03/2023)    Readmission Risk Interventions    05/15/2023    8:24 AM  Readmission Risk Prevention Plan  Transportation Screening Complete  PCP or Specialist Appt within 3-5 Days Complete  HRI or Home Care Consult Complete

## 2023-06-09 DIAGNOSIS — Z7401 Bed confinement status: Secondary | ICD-10-CM | POA: Diagnosis not present

## 2023-06-09 DIAGNOSIS — W0110XA Fall on same level from slipping, tripping and stumbling with subsequent striking against unspecified object, initial encounter: Secondary | ICD-10-CM | POA: Diagnosis not present

## 2023-06-09 DIAGNOSIS — M47812 Spondylosis without myelopathy or radiculopathy, cervical region: Secondary | ICD-10-CM | POA: Diagnosis not present

## 2023-06-09 DIAGNOSIS — R479 Unspecified speech disturbances: Secondary | ICD-10-CM | POA: Diagnosis not present

## 2023-06-09 DIAGNOSIS — R1312 Dysphagia, oropharyngeal phase: Secondary | ICD-10-CM | POA: Diagnosis not present

## 2023-06-09 DIAGNOSIS — R296 Repeated falls: Secondary | ICD-10-CM | POA: Diagnosis not present

## 2023-06-09 DIAGNOSIS — M4802 Spinal stenosis, cervical region: Secondary | ICD-10-CM | POA: Diagnosis not present

## 2023-06-09 DIAGNOSIS — R2689 Other abnormalities of gait and mobility: Secondary | ICD-10-CM | POA: Diagnosis not present

## 2023-06-09 DIAGNOSIS — R531 Weakness: Secondary | ICD-10-CM | POA: Diagnosis not present

## 2023-06-09 DIAGNOSIS — T85618A Breakdown (mechanical) of other specified internal prosthetic devices, implants and grafts, initial encounter: Secondary | ICD-10-CM | POA: Diagnosis not present

## 2023-06-09 DIAGNOSIS — R1111 Vomiting without nausea: Secondary | ICD-10-CM | POA: Diagnosis not present

## 2023-06-09 DIAGNOSIS — M6281 Muscle weakness (generalized): Secondary | ICD-10-CM | POA: Diagnosis not present

## 2023-06-09 DIAGNOSIS — C7A8 Other malignant neuroendocrine tumors: Secondary | ICD-10-CM | POA: Diagnosis not present

## 2023-06-09 DIAGNOSIS — K529 Noninfective gastroenteritis and colitis, unspecified: Secondary | ICD-10-CM | POA: Diagnosis not present

## 2023-06-09 DIAGNOSIS — Y732 Prosthetic and other implants, materials and accessory gastroenterology and urology devices associated with adverse incidents: Secondary | ICD-10-CM | POA: Diagnosis not present

## 2023-06-09 DIAGNOSIS — Z794 Long term (current) use of insulin: Secondary | ICD-10-CM | POA: Diagnosis not present

## 2023-06-09 DIAGNOSIS — G629 Polyneuropathy, unspecified: Secondary | ICD-10-CM | POA: Diagnosis not present

## 2023-06-09 DIAGNOSIS — T85698A Other mechanical complication of other specified internal prosthetic devices, implants and grafts, initial encounter: Secondary | ICD-10-CM | POA: Diagnosis not present

## 2023-06-09 DIAGNOSIS — R197 Diarrhea, unspecified: Secondary | ICD-10-CM | POA: Diagnosis not present

## 2023-06-09 DIAGNOSIS — I10 Essential (primary) hypertension: Secondary | ICD-10-CM | POA: Diagnosis not present

## 2023-06-09 DIAGNOSIS — E43 Unspecified severe protein-calorie malnutrition: Secondary | ICD-10-CM | POA: Diagnosis not present

## 2023-06-09 DIAGNOSIS — S0990XA Unspecified injury of head, initial encounter: Secondary | ICD-10-CM | POA: Diagnosis not present

## 2023-06-09 DIAGNOSIS — E559 Vitamin D deficiency, unspecified: Secondary | ICD-10-CM | POA: Diagnosis not present

## 2023-06-09 DIAGNOSIS — C170 Malignant neoplasm of duodenum: Secondary | ICD-10-CM | POA: Diagnosis not present

## 2023-06-09 DIAGNOSIS — E1165 Type 2 diabetes mellitus with hyperglycemia: Secondary | ICD-10-CM | POA: Diagnosis not present

## 2023-06-09 DIAGNOSIS — E119 Type 2 diabetes mellitus without complications: Secondary | ICD-10-CM | POA: Diagnosis not present

## 2023-06-09 DIAGNOSIS — R69 Illness, unspecified: Secondary | ICD-10-CM | POA: Diagnosis not present

## 2023-06-09 DIAGNOSIS — R41841 Cognitive communication deficit: Secondary | ICD-10-CM | POA: Diagnosis not present

## 2023-06-09 DIAGNOSIS — S0093XA Contusion of unspecified part of head, initial encounter: Secondary | ICD-10-CM | POA: Diagnosis not present

## 2023-06-09 DIAGNOSIS — E1159 Type 2 diabetes mellitus with other circulatory complications: Secondary | ICD-10-CM | POA: Diagnosis not present

## 2023-06-09 DIAGNOSIS — M47811 Spondylosis without myelopathy or radiculopathy, occipito-atlanto-axial region: Secondary | ICD-10-CM | POA: Diagnosis not present

## 2023-06-09 DIAGNOSIS — L89150 Pressure ulcer of sacral region, unstageable: Secondary | ICD-10-CM | POA: Diagnosis not present

## 2023-06-09 LAB — GLUCOSE, CAPILLARY
Glucose-Capillary: 153 mg/dL — ABNORMAL HIGH (ref 70–99)
Glucose-Capillary: 214 mg/dL — ABNORMAL HIGH (ref 70–99)

## 2023-06-09 LAB — VITAMIN C: Vitamin C: 0.6 mg/dL (ref 0.4–2.0)

## 2023-06-09 NOTE — TOC Transition Note (Signed)
 Transition of Care [facility]) - Discharge Note   Patient Details  Name: Andrew Tanner MRN: [id number] Date of Birth: 1951-01-18  [facility]) CM/SW Contact:  Deatra Robinson, [place] Phone Number: 06/09/2023, 1:09 PM   Clinical Narrative: Pt for dc to [facility] today. Spoke to [place] in admissions who confirmed they received auth details are prepared to admit pt to room 501. RN provided with number for report and PT[facility] arranged for transport. Pt's wife aware of dc and reports agreeable.   Dellie Burns, MSW, LCSW [phone] (coverage)        Final next level of care: Skilled Nursing Facility Barriers to Discharge: Barriers Resolved   Patient Goals and CMS Choice Patient states their goals for this hospitalization and ongoing recovery are:: resume his current activities CMS Medicare.gov Compare Post Acute Care list provided to:: Patient Choice offered to / list presented to : Patient [place] ownership interest in [facility].provided to:: Patient    Discharge Placement              Patient chooses bed at: [facility] Patient to be transferred to facility by: [facility] Name of family member notified: [name]/wife Patient and family notified of of transfer: 06/09/23  Discharge Plan and Services Additional resources added to the After Visit Summary for   In-house Referral: Clinical Social Work   Post Acute Care Choice: Home Health                               Social Drivers of Health (SDOH) Interventions SDOH Screenings   Food Insecurity: No Food Insecurity (05/14/2023)  Housing: Low Risk  (05/14/2023)  Transportation Needs: No Transportation Needs (05/14/2023)  Utilities: Not At Risk (05/14/2023)  Alcohol Screen: Low Risk  (10/19/2020)  Depression (PHQ2-9): Low Risk  (03/20/2023)  Financial Resource Strain: Low Risk  (03/16/2023)  Physical Activity: Sufficiently Active (03/16/2023)  Social Connections: Socially Integrated  (05/14/2023)  Stress: No Stress Concern Present (03/16/2023)  Tobacco Use: Medium Risk (05/13/2023)  Health Literacy: Adequate Health Literacy (01/03/2023)     Readmission Risk Interventions    05/15/2023    8:24 AM  Readmission Risk Prevention Plan  Transportation Screening Complete  PCP or Specialist Appt within 3-5 Days Complete  HRI or Home Care Consult Complete

## 2023-06-09 NOTE — Progress Notes (Signed)
 Subjective/Chief Complaint: Pt improving with therapy   Objective: Vital signs in last 24 hours: Temp:  [97.4 F (36.3 C)-98.4 F (36.9 C)] 97.4 F (36.3 C) (02/28 0749) Pulse Rate:  [82-88] 88 (02/28 0749) Resp:  [16-18] 17 (02/28 0749) BP: (125-151)/(80-85) 145/84 (02/28 0749) SpO2:  [99 %-100 %] 99 % (02/28 0749) Last BM Date : 06/07/23 (per chart)  Intake/Output from previous day: 02/27 0701 - 02/28 0700 In: 720 [P.O.:720] Out: 285 [Urine:200; Drains:85] Intake/Output this shift: No intake/output data recorded.  General:  alert and oriented.   Abdomen:  soft non distended, non tender Drain cloudy serosang.   Lab Results:  No results for input(s): "WBC", "HGB", "HCT", "PLT" in the last 72 hours.   BMET No results for input(s): "NA", "K", "CL", "CO2", "GLUCOSE", "BUN", "CREATININE", "CALCIUM" in the last 72 hours.   PT/INR No results for input(s): "LABPROT", "INR" in the last 72 hours. ABG No results for input(s): "PHART", "HCO3" in the last 72 hours.  Invalid input(s): "PCO2", "PO2"  Studies/Results: No results found.   Anti-infectives: Anti-infectives (From admission, onward)    Start     Dose/Rate Route Frequency Ordered Stop   05/18/23 0000  ciprofloxacin (CIPRO) 500 MG tablet  Status:  Discontinued        500 mg Oral 2 times daily 05/18/23 1250 06/08/23    05/18/23 0000  metroNIDAZOLE (FLAGYL) 500 MG tablet  Status:  Discontinued        500 mg Oral Every 12 hours 05/18/23 1250 06/08/23    05/17/23 2000  ciprofloxacin (CIPRO) tablet 500 mg        500 mg Oral 2 times daily 05/17/23 1128 05/20/23 2116   05/17/23 2000  metroNIDAZOLE (FLAGYL) tablet 500 mg        500 mg Oral Every 12 hours 05/17/23 1128 05/20/23 2116   05/14/23 0245  ciprofloxacin (CIPRO) IVPB 400 mg  Status:  Discontinued        400 mg 200 mL/hr over 60 Minutes Intravenous Every 12 hours 05/14/23 0227 05/17/23 1128   05/14/23 0245  metroNIDAZOLE (FLAGYL) IVPB 500 mg  Status:   Discontinued        500 mg 100 mL/hr over 60 Minutes Intravenous Every 12 hours 05/14/23 0227 05/17/23 1128       Assessment/Plan: 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    Readmitted with colitis and fall.   No apparent injury from fall IV antibiotics for colitis, switched to oral. 7 d total C diff negative as well as stool pathogen panel. Respiratory panel negative.     Lovenox for vte ppx IV fluids for diarrhea/rehydration, off now.  Rehydrated.    Insulin for DM, CBGs better.     Regular diet in order to have patient have as many options as possible.  At some point will need to go back to carb mod, but giving more choices is helping with his caloric intake.   Megace for appetite stimulation and protein calorie malnutrition. Albumin <3, but prealbumin 23. Prealbumin down a bit, but weight increased.  Holding off on G tube given improved PO intake.     Drain amylase elevated c/w panc leak. Plan d/c with drain.    PT/OT consult.  TOC consult. SLP consult. Pt working with therapies.  Doing best with PT, but having issues with ADLs and judgment.    Dispo:  await placement.  Request resubmitted yesterday.   Peer to peer yesterday  was successful reportedly.  Await placement.  Discharge summary in place.  AVS reviewed.   This patient has objective data that make it not possible to return home safely at this juncture.    LOS: 27 days    [name] 06/09/2023

## 2023-06-09 NOTE — Progress Notes (Signed)
 Report called to Renae Fickle, LPN at [facility].

## 2023-06-09 NOTE — Progress Notes (Signed)
 Physical Therapy Treatment Patient Details Name: Andrew Tanner MRN: [id number] DOB: [birth year] Today's Date: 06/09/2023   History of Present Illness 73 y/o M admitted 2/1 after fall at home and diarrhea.  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 CT scan of the abdomen and pelvis shows diffuse colitis. He has a small fluid collection near the pancreatic jejunal anastomosis. PMH: anxiety, DM.    PT Comments  Pt received in bed, agreeable to therapy session and with good participation and tolerance for transfer, gait and standing balance activities. Pt needing up to minA for safety with transfers and gait and with poor environmental awareness. Pt benefits from multimodal cues due to cognitive deficit and does better with Errorless Learning strategy with cues. Pt performed portions of Dynamic Gait Index and scored 8 out of possible 18 points on portions scored, indicating increased risk of falls. Pt up in chair to eat lunch with spouse supervision and chair alarm on for safety at end of session, RN notified. Pt will continue to benefit from skilled rehab in a post acute setting to maximize functional gains before returning home.     If plan is discharge home, recommend the following: A little help with walking and/or transfers;A little help with bathing/dressing/bathroom;Assistance with cooking/housework;Assist for transportation;Help with stairs or ramp for entrance;Supervision due to cognitive status   Can travel by private vehicle     Yes  Equipment Recommendations  None recommended by PT;Other (comment) (pt has rollator and shower seat at home)    Recommendations for Other Services       Precautions / Restrictions Precautions Precautions: Fall Recall of Precautions/Restrictions: Impaired Precaution/Restrictions Comments: JP drain right side Restrictions Weight Bearing Restrictions Per Provider Order: No     Mobility  Bed Mobility Overal bed  mobility: Needs Assistance Bed Mobility: Supine to Sit       Sit to supine: Supervision   General bed mobility comments: no assist required, cueing for safety    Transfers Overall transfer level: Needs assistance Equipment used: Rolling walker (2 wheels) Transfers: Sit to/from Stand Sit to Stand: Min assist           General transfer comment: cueing for recall of hand placement, min assist to steady when standing, pt does not line up safely with toilet seat prior to intending to sit and needs multimodal cues to reposition prior to sitting and needs hand over hand assist to reach for wall rail and toilet seat to ensure his safety. cues for UE placement also prior to sitting in chair and pt attempts to abandon RW prior to reaching chair    Ambulation/Gait Ambulation/Gait assistance: Min assist Gait Distance (Feet): 110 Feet Assistive device: Rolling walker (2 wheels) Gait Pattern/deviations: Step-through pattern, Decreased dorsiflexion - right, Decreased dorsiflexion - left, Trunk flexed, Narrow base of support, Drifts right/left       General Gait Details: Cues provided to avoid  bumping into obstacles within the unit and within his room. Some increased posterior instability today when turning and difficulty maintaining close proximity to RW.   Stairs         General stair comments: defer, pt wanting to eat lunch prior to DC   Wheelchair Mobility     Tilt Bed    Modified Rankin (Stroke Patients Only)       Balance Overall balance assessment: Needs assistance Sitting-balance support: Feet supported Sitting balance-Leahy Scale: Good     Standing balance support: No upper extremity supported,  During functional activity, Bilateral upper extremity supported Standing balance-Leahy Scale: Poor Standing balance comment: CGA washing hands with purell after toileting when unsupported; minA for dynamic standing tasks with and without RW                  Standardized Balance Assessment Standardized Balance Assessment : Dynamic Gait Index   Dynamic Gait Index Level Surface: Moderate Impairment Change in Gait Speed: Moderate Impairment Gait with Horizontal Head Turns: Mild Impairment Gait with Vertical Head Turns: Mild Impairment Gait and Pivot Turn: Moderate Impairment Step Over Obstacle:  (defer) Step Around Obstacles: Moderate Impairment Steps:  (defer stairs today; 8/possible 18 points out of items scored today = high fall risk)      Communication Communication Communication: No apparent difficulties  Cognition Arousal: Alert Behavior During Therapy: Flat affect   PT - Cognitive impairments: Memory, Sequencing, Problem solving, Safety/Judgement                       PT - Cognition Comments: Pt does better with Errorless Learning strategy with cues than with teachback method. Pt with poor recall of safe UE placement with transfers and poor awareness of proximity to intended surface prior to transferring, gait quality decreases as fatigue increases. Difficulty maintaining proximity to RW with turns and louder environment; Chair alarm on for safety at end of session while pt up in recliner to eat his lunch, spouse present. Following commands: Intact Following commands impaired: Follows one step commands with increased time, Follows multi-step commands inconsistently    Cueing Cueing Techniques: Verbal cues, Tactile cues, Visual cues  Exercises      General Comments General comments (skin integrity, edema, etc.): Pt assisted to doff soiled briefs when toileting and assisted to don new briefs after pt performed hygiene assist with supervision and CGA to minA for standing balance when pt pulling up briefs. drain site appears c/d/i and gait belt on during session and once pt up in chair as drain is looped in belt      Pertinent Vitals/Pain Pain Assessment Pain Assessment: Faces Pain Score: 0-No pain Faces Pain Scale: No  hurt Pain Intervention(s): Monitored during session, Repositioned    Home Living                          Prior Function            PT Goals (current goals can now be found in the care plan section) Acute Rehab PT Goals Patient Stated Goal: 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 PT Goal Formulation: With patient Time For Goal Achievement: 06/13/23 Progress towards PT goals: Progressing toward goals    Frequency    Min 1X/week      PT Plan      Co-evaluation              AM-PAC PT "6 Clicks" Mobility   Outcome Measure  Help needed turning from your back to your side while in a flat bed without using bedrails?: None Help needed moving from lying on your back to sitting on the side of a flat bed without using bedrails?: A Little Help needed moving to and from a bed to a chair (including a wheelchair)?: A Little Help needed standing up from a chair using your arms (e.g., wheelchair or bedside chair)?: A Little Help needed to walk in hospital room?: A Lot (mod safety cues) Help needed climbing 3-5 steps with  a railing? : A Lot (anticipate mod cues) 6 Click Score: 17    End of Session Equipment Utilized During Treatment: Gait belt Activity Tolerance: Patient tolerated treatment well Patient left: in chair;with call bell/phone within reach;with chair alarm set;with family/visitor present (eating lunch) Nurse Communication: Mobility status PT Visit Diagnosis: Unsteadiness on feet (R26.81);Other abnormalities of gait and mobility (R26.89);Muscle weakness (generalized) (M62.81)     Time: 1610-9604 PT Time Calculation (min) (ACUTE ONLY): 15 min  Charges:    $Gait Training: 8-22 mins PT General Charges $$ ACUTE PT VISIT: 1 Visit                     Coreena Rubalcava P., PTA Acute Rehabilitation Services Secure Chat Preferred 9a-5:30pm Office: [phone]    Dorathy Kinsman [facility] 06/09/2023, 1:44 PM

## 2023-06-09 NOTE — Progress Notes (Signed)
 Speech Language Pathology Treatment: Cognitive-Linquistic  Patient Details Name: Andrew Tanner MRN: [id number] DOB: 05/03/1950 Today's Date: 06/09/2023 Time: 2536-6440 SLP Time Calculation (min) (ACUTE ONLY): 14 min  Assessment / Plan / Recommendation Clinical Impression  Facilitated a medication management task with a simulated pill box activity. He required Mod cueing to reference target and instructions. Pt was able to accurately identify correctly placed pills with Min cueing, but required Max cueing to identify missing pills or other errors. Pt eager to continue working with therapies. SLP will continue following acutely and recommend ongoing f/u upon d/c to target deficits primarily related to memory and attention.    HPI HPI: 73yo M admitted 2/1 after fall at home and diarrhea. Head CT 2/1 without acute findings.  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 CT scan of the abdomen and pelvis shows diffuse colitis.  He has a small fluid collection near the pancreatico jejunal anastomosis. Poor po intake. Possible plan for feeding tube.  PMH: anxiety, DM      SLP Plan  Continue with current plan of care      Recommendations for follow up therapy are one component of a multi-disciplinary discharge planning process, led by the attending physician.  Recommendations may be updated based on patient status, additional functional criteria and insurance authorization.    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

## 2023-06-09 NOTE — Consult Note (Signed)
 [facility] Liaison Consult Note   06/09/2023  Zacharie Portner 01-Aug-1950 [id number]  PCP: Jeani Sow, MD with [place] [facility] [place] [facility]  Insurance:  [employer] [facility] Medicare   Follow up:  LLOS 26 days and disposition  Reviewed for disposition noted today, appeal approved for SNF rehab.  Patient noted for [facility] SNF rehab scheduled for transition today.  Plan:  Patient will have SNF rehab for post hospital transition and needs are to be met at a skilled nursing facility level of care. No VBCI follow up with this insurance plan for SNF.  For questions,   Charlesetta Shanks, RN, BSN, CCM [place]  [facility], [facility] Liaison Direct Dial: [phone] or secure chat Email: [email or web address]

## 2023-06-12 DIAGNOSIS — R2689 Other abnormalities of gait and mobility: Secondary | ICD-10-CM | POA: Diagnosis not present

## 2023-06-12 DIAGNOSIS — L89150 Pressure ulcer of sacral region, unstageable: Secondary | ICD-10-CM | POA: Diagnosis not present

## 2023-06-12 DIAGNOSIS — E119 Type 2 diabetes mellitus without complications: Secondary | ICD-10-CM | POA: Diagnosis not present

## 2023-06-12 DIAGNOSIS — R296 Repeated falls: Secondary | ICD-10-CM | POA: Diagnosis not present

## 2023-06-12 DIAGNOSIS — M6281 Muscle weakness (generalized): Secondary | ICD-10-CM | POA: Diagnosis not present

## 2023-06-12 DIAGNOSIS — C7A8 Other malignant neuroendocrine tumors: Secondary | ICD-10-CM | POA: Diagnosis not present

## 2023-06-12 DIAGNOSIS — K529 Noninfective gastroenteritis and colitis, unspecified: Secondary | ICD-10-CM | POA: Diagnosis not present

## 2023-06-12 DIAGNOSIS — E43 Unspecified severe protein-calorie malnutrition: Secondary | ICD-10-CM | POA: Diagnosis not present

## 2023-06-12 DIAGNOSIS — I10 Essential (primary) hypertension: Secondary | ICD-10-CM | POA: Diagnosis not present

## 2023-06-13 DIAGNOSIS — C7A8 Other malignant neuroendocrine tumors: Secondary | ICD-10-CM | POA: Diagnosis not present

## 2023-06-13 DIAGNOSIS — R1312 Dysphagia, oropharyngeal phase: Secondary | ICD-10-CM | POA: Diagnosis not present

## 2023-06-13 DIAGNOSIS — E1165 Type 2 diabetes mellitus with hyperglycemia: Secondary | ICD-10-CM | POA: Diagnosis not present

## 2023-06-13 DIAGNOSIS — M6281 Muscle weakness (generalized): Secondary | ICD-10-CM | POA: Diagnosis not present

## 2023-06-15 DIAGNOSIS — C7A8 Other malignant neuroendocrine tumors: Secondary | ICD-10-CM | POA: Diagnosis not present

## 2023-06-15 DIAGNOSIS — R2689 Other abnormalities of gait and mobility: Secondary | ICD-10-CM | POA: Diagnosis not present

## 2023-06-15 DIAGNOSIS — M6281 Muscle weakness (generalized): Secondary | ICD-10-CM | POA: Diagnosis not present

## 2023-06-15 DIAGNOSIS — E43 Unspecified severe protein-calorie malnutrition: Secondary | ICD-10-CM | POA: Diagnosis not present

## 2023-06-19 DIAGNOSIS — E43 Unspecified severe protein-calorie malnutrition: Secondary | ICD-10-CM | POA: Diagnosis not present

## 2023-06-19 DIAGNOSIS — M6281 Muscle weakness (generalized): Secondary | ICD-10-CM | POA: Diagnosis not present

## 2023-06-19 DIAGNOSIS — R2689 Other abnormalities of gait and mobility: Secondary | ICD-10-CM | POA: Diagnosis not present

## 2023-06-19 DIAGNOSIS — C7A8 Other malignant neuroendocrine tumors: Secondary | ICD-10-CM | POA: Diagnosis not present

## 2023-06-20 ENCOUNTER — Emergency Department (HOSPITAL_COMMUNITY)

## 2023-06-20 ENCOUNTER — Other Ambulatory Visit: Payer: Self-pay

## 2023-06-20 ENCOUNTER — Emergency Department (HOSPITAL_COMMUNITY)
Admission: EM | Admit: 2023-06-20 | Discharge: 2023-06-20 | Disposition: A | Discharge status: Skilled Nursing Facility | Attending: Emergency Medicine | Admitting: Emergency Medicine

## 2023-06-20 ENCOUNTER — Ambulatory Visit: Payer: Medicare Other | Admitting: Family Medicine

## 2023-06-20 DIAGNOSIS — Y732 Prosthetic and other implants, materials and accessory gastroenterology and urology devices associated with adverse incidents: Secondary | ICD-10-CM | POA: Diagnosis not present

## 2023-06-20 DIAGNOSIS — T85618A Breakdown (mechanical) of other specified internal prosthetic devices, implants and grafts, initial encounter: Secondary | ICD-10-CM | POA: Diagnosis not present

## 2023-06-20 DIAGNOSIS — E119 Type 2 diabetes mellitus without complications: Secondary | ICD-10-CM | POA: Diagnosis not present

## 2023-06-20 DIAGNOSIS — T85698A Other mechanical complication of other specified internal prosthetic devices, implants and grafts, initial encounter: Secondary | ICD-10-CM

## 2023-06-20 DIAGNOSIS — S0093XA Contusion of unspecified part of head, initial encounter: Secondary | ICD-10-CM | POA: Insufficient documentation

## 2023-06-20 DIAGNOSIS — S0990XA Unspecified injury of head, initial encounter: Secondary | ICD-10-CM | POA: Diagnosis not present

## 2023-06-20 DIAGNOSIS — R69 Illness, unspecified: Secondary | ICD-10-CM | POA: Diagnosis not present

## 2023-06-20 DIAGNOSIS — Z7401 Bed confinement status: Secondary | ICD-10-CM | POA: Diagnosis not present

## 2023-06-20 DIAGNOSIS — W0110XA Fall on same level from slipping, tripping and stumbling with subsequent striking against unspecified object, initial encounter: Secondary | ICD-10-CM | POA: Insufficient documentation

## 2023-06-20 DIAGNOSIS — M47811 Spondylosis without myelopathy or radiculopathy, occipito-atlanto-axial region: Secondary | ICD-10-CM | POA: Diagnosis not present

## 2023-06-20 DIAGNOSIS — R531 Weakness: Secondary | ICD-10-CM | POA: Diagnosis not present

## 2023-06-20 DIAGNOSIS — M47812 Spondylosis without myelopathy or radiculopathy, cervical region: Secondary | ICD-10-CM | POA: Diagnosis not present

## 2023-06-20 DIAGNOSIS — Z794 Long term (current) use of insulin: Secondary | ICD-10-CM | POA: Diagnosis not present

## 2023-06-20 DIAGNOSIS — M4802 Spinal stenosis, cervical region: Secondary | ICD-10-CM | POA: Diagnosis not present

## 2023-06-20 DIAGNOSIS — R479 Unspecified speech disturbances: Secondary | ICD-10-CM | POA: Diagnosis not present

## 2023-06-20 NOTE — ED Notes (Signed)
 RN Rebeckah made attempt to call in report. No answer.

## 2023-06-20 NOTE — ED Notes (Signed)
 Ptar called pt to [facility]-ETA 3rd out

## 2023-06-20 NOTE — ED Provider Notes (Incomplete Revision)
 [place] EMERGENCY DEPARTMENT AT [facility] Provider Note   CSN: [id number] Arrival date & time: 06/20/23  1657     History  Chief Complaint  Patient presents with   JP drain pulled out    Andrew Tanner is a 73 y.o. male.  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    He reports that one of the drains had already come out, this morning the second 1 accidentally fell out.  He does not have any pain out of the ordinary for his abdomen and has no fevers chills nausea vomiting or diarrhea.  Has mild abd pain - was putting out about 1 JP of fluid per day  (bulb),   Home Medications Prior to Admission medications   Medication Sig Start Date End Date Taking? Authorizing Provider  acetaminophen (TYLENOL) 500 MG tablet Take 2 tablets (1,000 mg total) by mouth every 6 (six) hours as needed for mild pain (pain score 1-3). Patient not taking: Reported on 05/13/2023 05/10/23   [name], MD  alfuzosin (UROXATRAL) 10 MG 24 hr tablet Take 1 tablet (10 mg total) by mouth daily with breakfast. 03/20/23   Jeani Sow, MD  B-D UF III MINI PEN NEEDLES 31G X 5 MM MISC USE DAILY AT 12 NOON 03/31/23   Jeani Sow, MD  clonazePAM (KLONOPIN) 0.5 MG tablet Take 1.5 mg by mouth at bedtime.    [provider]  Continuous Glucose Sensor (DEXCOM G7 SENSOR) MISC Use to monitor glucose continuously, change every 10 days 02/22/23   Carlus Pavlov, MD  feeding supplement (ENSURE ENLIVE / ENSURE PLUS) LIQD Take 237 mLs by mouth 2 (two) times daily between meals. 05/10/23   [name], MD  FLUoxetine (PROZAC) 20 MG capsule Take 60 mg by mouth every morning. 12/19/21    [provider]  glucose blood test strip 1 each by Other route in the morning and at bedtime. Use as instructed checking once per day, or if new symptoms. 01/16/23   Jeani Sow, MD  hydrOXYzine (ATARAX/VISTARIL) 50 MG tablet Take 50 mg by mouth at bedtime as needed (sleep/anxiety.). 12/16/20   [provider]  insulin aspart (NOVOLOG) 100 UNIT/ML injection Inject 0-5 Units into the skin at bedtime. 06/08/23   [name], MD  insulin aspart (NOVOLOG) 100 UNIT/ML injection Inject 0-9 Units into the skin 3 (three) times daily with meals. 06/08/23   [name], MD  insulin aspart (NOVOLOG) 100 UNIT/ML injection Inject 4 Units into the skin 3 (three) times daily with meals. 06/08/23   [name], MD  insulin glargine (LANTUS SOLOSTAR) 100 UNIT/ML Solostar Pen Inject 12 Units into the skin at bedtime. 06/08/23   [name], MD  Insulin Lispro-aabc (LYUMJEV KWIKPEN) 100 UNIT/ML KwikPen Inject 3-6 units under skin of insulin before dinner Patient taking differently: Inject 6 Units into the skin 2 (two) times daily before a meal. Before lunch and before supper 02/08/23   Carlus Pavlov, MD  Insulin Pen Needle 32G X 4 MM MISC Use 2x a day 02/08/23   Carlus Pavlov, MD  ketoconazole (NIZORAL) 2 % shampoo APPLY TO SCALP AND FACE 2 TO 3 TIMES  A WEEK. 04/16/23   Terri Piedra, DO  lipase/protease/amylase (CREON) 36000 UNITS CPEP capsule Take 2 capsules (72,000 Units total) by mouth 3 (three) times daily before meals. 05/18/23   [name], MD  lipase/protease/amylase (CREON) 36000 UNITS CPEP capsule Take 1 capsule (36,000 Units total) by mouth 2 (two) times daily between meals as needed (with snacks). 05/18/23   [name], MD  megestrol (MEGACE) 400 MG/10ML suspension Take 10 mLs (400 mg total) by mouth daily. 05/10/23   [name], MD  methocarbamol (ROBAXIN) 500 MG tablet Take 1 tablet (500 mg total) by mouth every 6 (six) hours as needed for muscle spasms (pain).  05/10/23   [name], MD  Multiple Vitamin (MULTIVITAMIN WITH MINERALS) TABS tablet Take 1 tablet by mouth daily. 06/09/23   [name], MD  Nystatin (GERHARDT'S BUTT CREAM) CREA Apply 1 Application topically 2 (two) times daily. 06/08/23   [name], MD  nystatin-triamcinolone ointment [facility]) Apply to scalp and face twice a day for up to 1 week and stop Patient taking differently: Apply 1 Application topically See admin instructions. Apply to scalp/face up to twice weekly when needed for skin irritation/flakes. 11/23/22   Terri Piedra, DO  oxyCODONE (OXY IR/ROXICODONE) 5 MG immediate release tablet Take 1 tablet (5 mg total) by mouth every 4 (four) hours as needed for severe pain (pain score 7-10), breakthrough pain or moderate pain (pain score 4-6). Patient not taking: Reported on 05/13/2023 05/10/23   [name], MD  pantoprazole (PROTONIX) 40 MG tablet Take 1 tablet (40 mg total) by mouth at bedtime. 05/10/23   [name], MD  polyethylene glycol (MIRALAX / GLYCOLAX) 17 g packet Take 17 g by mouth daily as needed (constipation.).    [provider]  prochlorperazine (COMPAZINE) 10 MG tablet Take 1 tablet (10 mg total) by mouth every 6 (six) hours as needed for nausea or vomiting (Use for nausea and / or vomiting unresolved with ondansetron (Zofran).). Patient not taking: Reported on 05/14/2023 05/10/23   [name], MD  rosuvastatin (CRESTOR) 20 MG tablet Take 1 tablet (20 mg total) by mouth daily. 05/10/23   [name], MD  simethicone (MYLICON) 80 MG chewable tablet Chew 1 tablet (80 mg total) by mouth every 6 (six) hours as needed for flatulence. 05/10/23   [name], MD      Allergies    Patient has no known allergies.    Review of Systems   Review of Systems  All other systems reviewed and are negative.   Physical Exam Updated Vital Signs BP (!) 110/59 (BP Location: Right Arm)   Pulse 78   Temp 98.2 F (36.8 C) (Oral)   Resp 15   Ht 1.803 m (5'  11")   Wt 67.1 kg   SpO2 100%   BMI 20.64 kg/m  Physical Exam Vitals and nursing note reviewed.  Constitutional:      General: He is not in acute distress.    Appearance: He is well-developed.  HENT:     Head: Normocephalic and atraumatic.     Mouth/Throat:     Pharynx: No oropharyngeal exudate.  Eyes:     General: No scleral icterus.       Right eye: No discharge.        Left eye: No discharge.     Conjunctiva/sclera: Conjunctivae normal.     Pupils: Pupils are equal, round, and reactive to light.  Neck:     Thyroid: No thyromegaly.  Vascular: No JVD.  Cardiovascular:     Rate and Rhythm: Normal rate and regular rhythm.     Heart sounds: Normal heart sounds. No murmur heard.    No friction rub. No gallop.  Pulmonary:     Effort: Pulmonary effort is normal. No respiratory distress.     Breath sounds: Normal breath sounds. No wheezing or rales.  Abdominal:     General: Bowel sounds are normal. There is no distension.     Palpations: Abdomen is soft. There is no mass.     Tenderness: There is no abdominal tenderness.     Comments: The JP site in the right lower abdomen has minimal redness, no tenderness, no drainage, no foul smell, abdomen is generally soft with minimal tenderness  Musculoskeletal:        General: No tenderness. Normal range of motion.     Cervical back: Normal range of motion and neck supple.     Right lower leg: No edema.     Left lower leg: No edema.  Lymphadenopathy:     Cervical: No cervical adenopathy.  Skin:    General: Skin is warm and dry.     Findings: No erythema or rash.  Neurological:     Mental Status: He is alert.     Coordination: Coordination normal.  Psychiatric:        Behavior: Behavior normal.     ED Results / Procedures / Treatments   Labs (all labs ordered are listed, but only abnormal results are displayed) Labs Reviewed - No data to display  EKG None  Radiology No results found.  Procedures Procedures     Medications Ordered in ED Medications - No data to display  ED Course/ Medical Decision Making/ A&P                                 Medical Decision Making Amount and/or Complexity of Data Reviewed Radiology: ordered.   This patient is extremely well-appearing, he has vital signs which are normal, his abdomen is nonsurgical and I discussed his case with the surgeon on-call, and they recommend having him follow-up in the outpatient setting given his minimal symptoms.  They will contact the patient's primary surgeon Dr. Donell Beers and coordinate a close follow-up.  The patient is agreeable with this plan  The patient had an unfortunate episode in the emergency department where he tried to get out of bed lost his balance and fell striking his head.  He states he does not have a headache, he does have a contusion to the top of his head.  Will perform CT to make sure there is no signs of injury.  Otherwise he is well-appearing, no seizures, no vomiting          Final Clinical Impression(s) / ED Diagnoses Final diagnoses:  JP drain, broken, initial encounter    Rx / DC Orders ED Discharge Orders     None         Eber Hong, MD 06/20/23 1722

## 2023-06-20 NOTE — ED Notes (Signed)
 PT has a abrasion on top of his head from getting out of his stretcher and falling. Fall was witnessed and vs were as follows: BP: 158/90 (109) O2:100% t: 98.2 F  P:94 and RR:18

## 2023-06-20 NOTE — ED Triage Notes (Signed)
 Pt to ED via EMS from [facility] after his JP drain came dislodged. Surgery was in November-had Whipple. A&O x4

## 2023-06-20 NOTE — ED Provider Notes (Signed)
 [place] [facility] AT [facility] Provider Note   CSN: [id number] Arrival date & time: 06/20/23  1657     History  Chief Complaint  Patient presents with   JP drain pulled out    Andrew Tanner is a 73 y.o. male.  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    He reports that one of the drains had already come out, this morning the second 1 accidentally fell out.  He does not have any pain out of the ordinary for his abdomen and has no fevers chills nausea vomiting or diarrhea.  Has mild abd pain - was putting out about 1 JP of fluid per day  (bulb),   Home Medications Prior to Admission medications   Medication Sig Start Date End Date Taking? Authorizing Provider  acetaminophen (TYLENOL) 500 MG tablet Take 2 tablets (1,000 mg total) by mouth every 6 (six) hours as needed for mild pain (pain score 1-3). Patient not taking: Reported on 05/13/2023 05/10/23   [name], MD  alfuzosin (UROXATRAL) 10 MG 24 hr tablet Take 1 tablet (10 mg total) by mouth daily with breakfast. 03/20/23   Jeani Sow, MD  B-D UF III MINI PEN NEEDLES 31G X 5 MM MISC USE DAILY AT 12 NOON 03/31/23   Jeani Sow, MD  clonazePAM (KLONOPIN) 0.5 MG tablet Take 1.5 mg by mouth at bedtime.    [provider]  Continuous Glucose Sensor (DEXCOM G7 SENSOR) MISC Use to monitor glucose continuously, change every 10 days 02/22/23   Carlus Pavlov, MD  feeding supplement (ENSURE ENLIVE / ENSURE PLUS) LIQD Take 237 mLs by mouth 2 (two) times daily between meals. 05/10/23   [name], MD  FLUoxetine (PROZAC) 20 MG capsule Take 60 mg by mouth every morning. 12/19/21    [provider]  glucose blood test strip 1 each by Other route in the morning and at bedtime. Use as instructed checking once per day, or if new symptoms. 01/16/23   Jeani Sow, MD  hydrOXYzine (ATARAX/VISTARIL) 50 MG tablet Take 50 mg by mouth at bedtime as needed (sleep/anxiety.). 12/16/20   [provider]  insulin aspart (NOVOLOG) 100 UNIT/ML injection Inject 0-5 Units into the skin at bedtime. 06/08/23   [name], MD  insulin aspart (NOVOLOG) 100 UNIT/ML injection Inject 0-9 Units into the skin 3 (three) times daily with meals. 06/08/23   [name], MD  insulin aspart (NOVOLOG) 100 UNIT/ML injection Inject 4 Units into the skin 3 (three) times daily with meals. 06/08/23   [name], MD  insulin glargine (LANTUS SOLOSTAR) 100 UNIT/ML Solostar Pen Inject 12 Units into the skin at bedtime. 06/08/23   [name], MD  Insulin Lispro-aabc (LYUMJEV KWIKPEN) 100 UNIT/ML KwikPen Inject 3-6 units under skin of insulin before dinner Patient taking differently: Inject 6 Units into the skin 2 (two) times daily before a meal. Before lunch and before supper 02/08/23   Carlus Pavlov, MD  Insulin Pen Needle 32G X 4 MM MISC Use 2x a day 02/08/23   Carlus Pavlov, MD  ketoconazole (NIZORAL) 2 % shampoo APPLY TO SCALP AND FACE 2 TO 3 TIMES  A WEEK. 04/16/23   Terri Piedra, DO  lipase/protease/amylase (CREON) 36000 UNITS CPEP capsule Take 2 capsules (72,000 Units total) by mouth 3 (three) times daily before meals. 05/18/23   [name], MD  lipase/protease/amylase (CREON) 36000 UNITS CPEP capsule Take 1 capsule (36,000 Units total) by mouth 2 (two) times daily between meals as needed (with snacks). 05/18/23   [name], MD  megestrol (MEGACE) 400 MG/10ML suspension Take 10 mLs (400 mg total) by mouth daily. 05/10/23   [name], MD  methocarbamol (ROBAXIN) 500 MG tablet Take 1 tablet (500 mg total) by mouth every 6 (six) hours as needed for muscle spasms (pain).  05/10/23   [name], MD  Multiple Vitamin (MULTIVITAMIN WITH MINERALS) TABS tablet Take 1 tablet by mouth daily. 06/09/23   [name], MD  Nystatin (GERHARDT'S BUTT CREAM) CREA Apply 1 Application topically 2 (two) times daily. 06/08/23   [name], MD  nystatin-triamcinolone ointment [facility]) Apply to scalp and face twice a day for up to 1 week and stop Patient taking differently: Apply 1 Application topically See admin instructions. Apply to scalp/face up to twice weekly when needed for skin irritation/flakes. 11/23/22   Terri Piedra, DO  oxyCODONE (OXY IR/ROXICODONE) 5 MG immediate release tablet Take 1 tablet (5 mg total) by mouth every 4 (four) hours as needed for severe pain (pain score 7-10), breakthrough pain or moderate pain (pain score 4-6). Patient not taking: Reported on 05/13/2023 05/10/23   [name], MD  pantoprazole (PROTONIX) 40 MG tablet Take 1 tablet (40 mg total) by mouth at bedtime. 05/10/23   [name], MD  polyethylene glycol (MIRALAX / GLYCOLAX) 17 g packet Take 17 g by mouth daily as needed (constipation.).    [provider]  prochlorperazine (COMPAZINE) 10 MG tablet Take 1 tablet (10 mg total) by mouth every 6 (six) hours as needed for nausea or vomiting (Use for nausea and / or vomiting unresolved with ondansetron (Zofran).). Patient not taking: Reported on 05/14/2023 05/10/23   [name], MD  rosuvastatin (CRESTOR) 20 MG tablet Take 1 tablet (20 mg total) by mouth daily. 05/10/23   [name], MD  simethicone (MYLICON) 80 MG chewable tablet Chew 1 tablet (80 mg total) by mouth every 6 (six) hours as needed for flatulence. 05/10/23   [name], MD      Allergies    Patient has no known allergies.    Review of Systems   Review of Systems  All other systems reviewed and are negative.   Physical Exam Updated Vital Signs BP (!) 158/90 (BP Location: Right Arm)   Pulse 94   Temp 98.2 F (36.8 C) (Oral)   Resp 18   Ht 1.803 m (5'  11")   Wt 67.1 kg   SpO2 100%   BMI 20.64 kg/m  Physical Exam Vitals and nursing note reviewed.  Constitutional:      General: He is not in acute distress.    Appearance: He is well-developed.  HENT:     Head: Normocephalic and atraumatic.     Mouth/Throat:     Pharynx: No oropharyngeal exudate.  Eyes:     General: No scleral icterus.       Right eye: No discharge.        Left eye: No discharge.     Conjunctiva/sclera: Conjunctivae normal.     Pupils: Pupils are equal, round, and reactive to light.  Neck:     Thyroid: No thyromegaly.  Vascular: No JVD.  Cardiovascular:     Rate and Rhythm: Normal rate and regular rhythm.     Heart sounds: Normal heart sounds. No murmur heard.    No friction rub. No gallop.  Pulmonary:     Effort: Pulmonary effort is normal. No respiratory distress.     Breath sounds: Normal breath sounds. No wheezing or rales.  Abdominal:     General: Bowel sounds are normal. There is no distension.     Palpations: Abdomen is soft. There is no mass.     Tenderness: There is no abdominal tenderness.     Comments: The JP site in the right lower abdomen has minimal redness, no tenderness, no drainage, no foul smell, abdomen is generally soft with minimal tenderness  Musculoskeletal:        General: No tenderness. Normal range of motion.     Cervical back: Normal range of motion and neck supple.     Right lower leg: No edema.     Left lower leg: No edema.  Lymphadenopathy:     Cervical: No cervical adenopathy.  Skin:    General: Skin is warm and dry.     Findings: No erythema or rash.  Neurological:     Mental Status: He is alert.     Coordination: Coordination normal.  Psychiatric:        Behavior: Behavior normal.     ED Results / Procedures / Treatments   Labs (all labs ordered are listed, but only abnormal results are displayed) Labs Reviewed - No data to display  EKG None  Radiology CT Head Wo Contrast Result Date:  06/20/2023 CLINICAL DATA:  Provided history: Fall in ER. Head trauma, GCS = 15, severe headache. Head CT 11/09/2022. EXAM: CT HEAD WITHOUT CONTRAST CT CERVICAL SPINE WITHOUT CONTRAST TECHNIQUE: Multidetector CT imaging of the head and cervical spine was performed following the standard protocol without intravenous contrast. Multiplanar CT image reconstructions of the cervical spine were also generated. RADIATION DOSE REDUCTION: This exam was performed according to the departmental dose-optimization program which includes automated exposure control, adjustment of the mA and/or kV according to patient size and/or use of iterative reconstruction technique. COMPARISON:  Head CT 05/13/2023.  Cervical spine CT 05/13/2023. FINDINGS: CT HEAD FINDINGS Brain: Generalized cerebral atrophy. Unchanged small hypodense focus within the left basal ganglia inferiorly, likely reflecting a prominent perivascular space. 2 cm pituitary mass, unchanged from the recent prior head CT of 05/13/2023 (remeasured on prior). 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 There is no acute intracranial hemorrhage. No demarcated cortical infarct. No extra-axial fluid collection. No midline shift. Vascular: No hyperdense vessel.  Atherosclerotic calcifications. Skull: No calvarial fracture or aggressive osseous lesion. Sinuses/Orbits: No mass or acute finding within the imaged orbits. Minimal mucosal thickening scattered within the paranasal sinuses at the imaged levels. CT CERVICAL SPINE FINDINGS Alignment: No significant spondylolisthesis. Skull base and vertebrae: The basion-dental and atlanto-dental intervals are maintained.No evidence of acute fracture to the cervical spine. Prior C4-C6 ACDF. Solid bridging osseous fusion across the C4-C5 and C5-C6 disc spaces. Post laminotomy changes at C3, C4, C5, C6 and C7. Prior plate and screw fixation of the left C3, C4, C5, C6 and C7 laminae. Soft tissues and spinal  canal: No prevertebral fluid or swelling. No visible canal hematoma. Disc levels: Postoperative changes to the cervical spine as described above. Superimposed cervical spondylosis with multilevel disc space narrowing, disc bulges, posterior disc osteophyte complexes, uncovertebral hypertrophy and facet arthropathy. No  appreciable high-grade spinal canal stenosis. Multilevel bony neural from narrowing. Degenerative changes also present at the C1-C2 articulation. Upper chest: No consolidation within the imaged lung apices. No visible pneumothorax. Other: Left thyroid lobe nodules measuring up to 2.7 cm. IMPRESSION: CT head: 1.  No evidence of an acute intracranial abnormality. 2. 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 A non-emergent pituitary protocol brain MRI (with and without contrast) is again recommended for further evaluation. 3. 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 4. Cerebral atrophy. CT cervical spine: 1. No evidence of an acute cervical spine fracture. 2. Cervical spondylosis and postoperative changes, as described. 3. Left thyroid lobe nodules measuring up to 2.7 cm. A nonemergent thyroid ultrasound is recommended for further evaluation. Reference: J Am Coll Radiol. 2015 Feb;12(2): 143-50. Electronically Signed   By: Jackey Loge D.O.   On: 06/20/2023 21:59   CT CERVICAL SPINE WO CONTRAST Result Date: 06/20/2023 CLINICAL DATA:  Provided history: Fall in ER. Head trauma, GCS = 15, severe headache. Head CT 11/09/2022. EXAM: CT HEAD WITHOUT CONTRAST CT CERVICAL SPINE WITHOUT CONTRAST TECHNIQUE: Multidetector CT imaging of the head and cervical spine was performed following the standard protocol without intravenous contrast. Multiplanar CT image reconstructions of the cervical spine were also generated. RADIATION DOSE REDUCTION: This exam was performed according to the departmental dose-optimization program which includes automated  exposure control, adjustment of the mA and/or kV according to patient size and/or use of iterative reconstruction technique. COMPARISON:  Head CT 05/13/2023.  Cervical spine CT 05/13/2023. FINDINGS: CT HEAD FINDINGS Brain: Generalized cerebral atrophy. Unchanged small hypodense focus within the left basal ganglia inferiorly, likely reflecting a prominent perivascular space. 2 cm pituitary mass, unchanged from the recent prior head CT of 05/13/2023 (remeasured on prior). 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 There is no acute intracranial hemorrhage. No demarcated cortical infarct. No extra-axial fluid collection. No midline shift. Vascular: No hyperdense vessel.  Atherosclerotic calcifications. Skull: No calvarial fracture or aggressive osseous lesion. Sinuses/Orbits: No mass or acute finding within the imaged orbits. Minimal mucosal thickening scattered within the paranasal sinuses at the imaged levels. CT CERVICAL SPINE FINDINGS Alignment: No significant spondylolisthesis. Skull base and vertebrae: The basion-dental and atlanto-dental intervals are maintained.No evidence of acute fracture to the cervical spine. Prior C4-C6 ACDF. Solid bridging osseous fusion across the C4-C5 and C5-C6 disc spaces. Post laminotomy changes at C3, C4, C5, C6 and C7. Prior plate and screw fixation of the left C3, C4, C5, C6 and C7 laminae. Soft tissues and spinal canal: No prevertebral fluid or swelling. No visible canal hematoma. Disc levels: Postoperative changes to the cervical spine as described above. Superimposed cervical spondylosis with multilevel disc space narrowing, disc bulges, posterior disc osteophyte complexes, uncovertebral hypertrophy and facet arthropathy. No appreciable high-grade spinal canal stenosis. Multilevel bony neural from narrowing. Degenerative changes also present at the C1-C2 articulation. Upper chest: No consolidation within the imaged lung apices. No visible  pneumothorax. Other: Left thyroid lobe nodules measuring up to 2.7 cm. IMPRESSION: CT head: 1.  No evidence of an acute intracranial abnormality. 2. 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 A non-emergent pituitary protocol brain MRI (with and without contrast) is again recommended for further evaluation. 3. 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 4. Cerebral atrophy. CT cervical spine: 1. No evidence of an acute cervical spine fracture. 2. Cervical spondylosis and postoperative changes, as described.  3. Left thyroid lobe nodules measuring up to 2.7 cm. A nonemergent thyroid ultrasound is recommended for further evaluation. Reference: J Am Coll Radiol. 2015 Feb;12(2): 143-50. Electronically Signed   By: Jackey Loge D.O.   On: 06/20/2023 21:59    Procedures Procedures    Medications Ordered in ED Medications - No data to display  ED Course/ Medical Decision Making/ A&P                                 Medical Decision Making Amount and/or Complexity of Data Reviewed Radiology: ordered.   This patient is extremely well-appearing, he has vital signs which are normal, his abdomen is nonsurgical and I discussed his case with the surgeon on-call, and they recommend having him follow-up in the outpatient setting given his minimal symptoms.  They will contact the patient's primary surgeon Dr. Donell Beers and coordinate a close follow-up.  The patient is agreeable with this plan  The patient had an unfortunate episode in the emergency department where he tried to get out of bed lost his balance and fell striking his head.  He states he does not have a headache, he does have a contusion to the top of his head.  Will perform CT to make sure there is no signs of injury.  Otherwise he is well-appearing, no seizures, no vomiting  CT scan negative for intracranial findings, no acute changes in other findings including pituitary 2 cm mass and  chronic white matter disease          Final Clinical Impression(s) / ED Diagnoses Final diagnoses:  JP drain, broken, initial encounter    Rx / DC Orders ED Discharge Orders     None         Eber Hong, MD 06/20/23 1722    Eber Hong, MD 06/22/23 1440

## 2023-06-20 NOTE — Discharge Instructions (Signed)
 The drain does not need to go back in today, I did discuss the care with the general surgery team and they want you to follow-up in the clinic, they do not think it needs to go back in today.  If you do develop severe or worsening pain vomiting or fever return to the ER immediately

## 2023-06-22 DIAGNOSIS — R2689 Other abnormalities of gait and mobility: Secondary | ICD-10-CM | POA: Diagnosis not present

## 2023-06-22 DIAGNOSIS — E43 Unspecified severe protein-calorie malnutrition: Secondary | ICD-10-CM | POA: Diagnosis not present

## 2023-06-22 DIAGNOSIS — C7A8 Other malignant neuroendocrine tumors: Secondary | ICD-10-CM | POA: Diagnosis not present

## 2023-06-22 DIAGNOSIS — R1312 Dysphagia, oropharyngeal phase: Secondary | ICD-10-CM | POA: Diagnosis not present

## 2023-06-22 DIAGNOSIS — M6281 Muscle weakness (generalized): Secondary | ICD-10-CM | POA: Diagnosis not present

## 2023-06-22 DIAGNOSIS — E1165 Type 2 diabetes mellitus with hyperglycemia: Secondary | ICD-10-CM | POA: Diagnosis not present

## 2023-06-23 ENCOUNTER — Inpatient Hospital Stay: Admitting: Family Medicine

## 2023-06-24 DIAGNOSIS — R2689 Other abnormalities of gait and mobility: Secondary | ICD-10-CM | POA: Diagnosis not present

## 2023-06-24 DIAGNOSIS — C7A8 Other malignant neuroendocrine tumors: Secondary | ICD-10-CM | POA: Diagnosis not present

## 2023-06-24 DIAGNOSIS — R41841 Cognitive communication deficit: Secondary | ICD-10-CM | POA: Diagnosis not present

## 2023-06-24 DIAGNOSIS — M6281 Muscle weakness (generalized): Secondary | ICD-10-CM | POA: Diagnosis not present

## 2023-06-24 DIAGNOSIS — R1312 Dysphagia, oropharyngeal phase: Secondary | ICD-10-CM | POA: Diagnosis not present

## 2023-06-25 DIAGNOSIS — C7A8 Other malignant neuroendocrine tumors: Secondary | ICD-10-CM | POA: Diagnosis not present

## 2023-06-25 DIAGNOSIS — M6281 Muscle weakness (generalized): Secondary | ICD-10-CM | POA: Diagnosis not present

## 2023-06-25 DIAGNOSIS — R1312 Dysphagia, oropharyngeal phase: Secondary | ICD-10-CM | POA: Diagnosis not present

## 2023-06-25 DIAGNOSIS — R41841 Cognitive communication deficit: Secondary | ICD-10-CM | POA: Diagnosis not present

## 2023-06-25 DIAGNOSIS — R2689 Other abnormalities of gait and mobility: Secondary | ICD-10-CM | POA: Diagnosis not present

## 2023-06-26 ENCOUNTER — Other Ambulatory Visit: Payer: Self-pay

## 2023-06-26 ENCOUNTER — Emergency Department (HOSPITAL_COMMUNITY)
Admission: EM | Admit: 2023-06-26 | Discharge: 2023-06-26 | Disposition: A | Discharge status: Home / Self Care | Attending: Emergency Medicine | Admitting: Emergency Medicine

## 2023-06-26 ENCOUNTER — Emergency Department (HOSPITAL_COMMUNITY)

## 2023-06-26 DIAGNOSIS — C7A8 Other malignant neuroendocrine tumors: Secondary | ICD-10-CM | POA: Diagnosis not present

## 2023-06-26 DIAGNOSIS — S199XXA Unspecified injury of neck, initial encounter: Secondary | ICD-10-CM | POA: Diagnosis not present

## 2023-06-26 DIAGNOSIS — E236 Other disorders of pituitary gland: Secondary | ICD-10-CM | POA: Diagnosis not present

## 2023-06-26 DIAGNOSIS — S0990XA Unspecified injury of head, initial encounter: Secondary | ICD-10-CM | POA: Diagnosis not present

## 2023-06-26 DIAGNOSIS — W19XXXA Unspecified fall, initial encounter: Secondary | ICD-10-CM | POA: Diagnosis not present

## 2023-06-26 DIAGNOSIS — Z794 Long term (current) use of insulin: Secondary | ICD-10-CM | POA: Insufficient documentation

## 2023-06-26 DIAGNOSIS — Z85068 Personal history of other malignant neoplasm of small intestine: Secondary | ICD-10-CM | POA: Diagnosis not present

## 2023-06-26 DIAGNOSIS — M6281 Muscle weakness (generalized): Secondary | ICD-10-CM | POA: Diagnosis not present

## 2023-06-26 DIAGNOSIS — W1830XA Fall on same level, unspecified, initial encounter: Secondary | ICD-10-CM | POA: Diagnosis not present

## 2023-06-26 DIAGNOSIS — S0003XA Contusion of scalp, initial encounter: Secondary | ICD-10-CM | POA: Diagnosis not present

## 2023-06-26 DIAGNOSIS — Z23 Encounter for immunization: Secondary | ICD-10-CM | POA: Insufficient documentation

## 2023-06-26 DIAGNOSIS — E041 Nontoxic single thyroid nodule: Secondary | ICD-10-CM | POA: Insufficient documentation

## 2023-06-26 DIAGNOSIS — E119 Type 2 diabetes mellitus without complications: Secondary | ICD-10-CM | POA: Insufficient documentation

## 2023-06-26 DIAGNOSIS — I10 Essential (primary) hypertension: Secondary | ICD-10-CM | POA: Diagnosis not present

## 2023-06-26 DIAGNOSIS — E43 Unspecified severe protein-calorie malnutrition: Secondary | ICD-10-CM | POA: Diagnosis not present

## 2023-06-26 DIAGNOSIS — S0083XA Contusion of other part of head, initial encounter: Secondary | ICD-10-CM | POA: Insufficient documentation

## 2023-06-26 DIAGNOSIS — R2689 Other abnormalities of gait and mobility: Secondary | ICD-10-CM | POA: Diagnosis not present

## 2023-06-26 DIAGNOSIS — R41841 Cognitive communication deficit: Secondary | ICD-10-CM | POA: Diagnosis not present

## 2023-06-26 DIAGNOSIS — E1165 Type 2 diabetes mellitus with hyperglycemia: Secondary | ICD-10-CM | POA: Diagnosis not present

## 2023-06-26 DIAGNOSIS — R1312 Dysphagia, oropharyngeal phase: Secondary | ICD-10-CM | POA: Diagnosis not present

## 2023-06-26 DIAGNOSIS — R42 Dizziness and giddiness: Secondary | ICD-10-CM | POA: Diagnosis not present

## 2023-06-26 MED ORDER — TETANUS-DIPHTH-ACELL PERTUSSIS 5-2.5-18.5 LF-MCG/0.5 IM SUSY
0.5000 mL | PREFILLED_SYRINGE | Freq: Once | INTRAMUSCULAR | Status: AC
  Administered 2023-06-26: 0.5 mL via INTRAMUSCULAR
  Filled 2023-06-26: qty 0.5

## 2023-06-26 NOTE — ED Notes (Signed)
 Pt to CT

## 2023-06-26 NOTE — ED Notes (Signed)
 Pt up to bathroom.

## 2023-06-26 NOTE — Discharge Instructions (Signed)
 You were seen in the emergency department after your fall.  Your CT scans showed no broken bones or bleeding in your brain.  It incidentally did show that you have a nodule on your thyroid that should be followed up with by your primary doctor.  You can ice your forehead to help with the swelling and take Tylenol and Motrin as needed for pain.  You can follow-up with your primary care doctor to have your symptoms rechecked.  You should return to the emergency department for severe headaches, repetitive vomiting, numbness or weakness in your arms or legs or any other new or concerning symptoms.

## 2023-06-26 NOTE — ED Notes (Signed)
 Pt back in bed.

## 2023-06-26 NOTE — ED Provider Notes (Signed)
 [place] EMERGENCY DEPARTMENT AT [facility] Provider Note   CSN: [id number] Arrival date & time: 06/26/23  4098     History  Chief Complaint  Patient presents with   Fall    Unwitnessed fall. Pt a/o x4. Pt has hematoma on right side of forehead.     Cristoval Teall is a 73 y.o. male.  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  Patient states that he is currently at a SNF and is asleep in bed and accidentally rolled out of the bed in his sleep and hit his head on the floor.  He denied any loss of consciousness.  He denies any headaches, nausea, vomiting, vision changes, numbness or weakness.  He denies any other pain or injuries from the fall.  He denies any recent dizziness or illnesses prior to the fall.  He denies any blood thinner use.  The history is provided by the patient and a relative.  Fall       Home Medications Prior to Admission medications   Medication Sig Start Date End Date Taking? Authorizing Provider  acetaminophen (TYLENOL) 500 MG tablet Take 2 tablets (1,000 mg total) by mouth every 6 (six) hours as needed for mild pain (pain score 1-3). 05/10/23  Yes [name], MD  alfuzosin (UROXATRAL) 10 MG 24 hr tablet Take 1 tablet (10 mg total) by mouth daily with breakfast. 03/20/23  Yes Jeani Sow, MD  clonazePAM (KLONOPIN) 0.5 MG tablet Take 1.5 mg by mouth at bedtime.   Yes [provider]  FLUoxetine (PROZAC) 20 MG capsule Take 60 mg by mouth every morning. 12/19/21  Yes [provider]  hydrOXYzine (ATARAX/VISTARIL) 50 MG tablet Take 50 mg by mouth at bedtime as needed (sleep/anxiety.). 12/16/20  Yes [provider]  insulin aspart (NOVOLOG) 100 UNIT/ML injection Inject 4 Units into the skin 3 (three) times daily with meals. 06/08/23  Yes [name], MD  insulin glargine (LANTUS  SOLOSTAR) 100 UNIT/ML Solostar Pen Inject 12 Units into the skin at bedtime. 06/08/23  Yes [name], MD  ketoconazole (NIZORAL) 2 % shampoo APPLY TO SCALP AND FACE 2 TO 3 TIMES A WEEK. Patient taking differently: Apply 1 Application topically See admin instructions. APPLY TO SCALP AND FACE 2 TO 3 TIMES A WEEK. 04/16/23  Yes Terri Piedra, DO  lipase/protease/amylase (CREON) 36000 UNITS CPEP capsule Take 2 capsules (72,000 Units total) by mouth 3 (three) times daily before meals. 05/18/23  Yes [name], MD  megestrol (MEGACE) 400 MG/10ML suspension Take 10 mLs (400 mg total) by mouth daily. 05/10/23  Yes [name], MD  methocarbamol (ROBAXIN) 500 MG tablet Take 1 tablet (500 mg total) by mouth every 6 (six) hours as needed for muscle spasms (pain). 05/10/23  Yes [name], MD  oxyCODONE (OXY IR/ROXICODONE) 5 MG immediate release tablet Take 1 tablet (5 mg total) by mouth every 4 (four) hours as needed for severe pain (pain score 7-10), breakthrough pain or moderate pain (pain score 4-6). 05/10/23  Yes [name], MD  pantoprazole (PROTONIX) 40 MG tablet Take 1 tablet (40 mg total) by mouth at bedtime. 05/10/23  Yes [name], MD  polyethylene glycol (MIRALAX / GLYCOLAX) 17 g packet Take 17 g by mouth daily as needed (constipation.).   Yes [provider]  prochlorperazine (COMPAZINE) 10 MG tablet Take 1 tablet (10 mg total) by  mouth every 6 (six) hours as needed for nausea or vomiting (Use for nausea and / or vomiting unresolved with ondansetron (Zofran).). 05/10/23  Yes [name], MD  rosuvastatin (CRESTOR) 20 MG tablet Take 1 tablet (20 mg total) by mouth daily. 05/10/23  Yes [name], MD  simethicone (MYLICON) 80 MG chewable tablet Chew 1 tablet (80 mg total) by mouth every 6 (six) hours as needed for flatulence. 05/10/23  Yes [name], MD  B-D UF III MINI PEN NEEDLES 31G X 5 MM MISC USE DAILY AT 12 NOON 03/31/23   Jeani Sow, MD  Continuous Glucose Sensor  (DEXCOM G7 SENSOR) MISC Use to monitor glucose continuously, change every 10 days 02/22/23   Carlus Pavlov, MD  feeding supplement (ENSURE ENLIVE / ENSURE PLUS) LIQD Take 237 mLs by mouth 2 (two) times daily between meals. Patient not taking: Reported on 06/26/2023 05/10/23   [name], MD  glucose blood test strip 1 each by Other route in the morning and at bedtime. Use as instructed checking once per day, or if new symptoms. 01/16/23   Jeani Sow, MD  Insulin Lispro-aabc [facility]) 100 UNIT/ML KwikPen Inject 3-6 units under skin of insulin before dinner Patient not taking: Reported on 06/26/2023 02/08/23   Carlus Pavlov, MD  Insulin Pen Needle 32G X 4 MM MISC Use 2x a day 02/08/23   Carlus Pavlov, MD  Multiple Vitamin (MULTIVITAMIN WITH MINERALS) TABS tablet Take 1 tablet by mouth daily. Patient not taking: Reported on 06/26/2023 06/09/23   [name], MD  Nystatin (GERHARDT'S BUTT CREAM) CREA Apply 1 Application topically 2 (two) times daily. Patient not taking: Reported on 06/26/2023 06/08/23   [name], MD  nystatin-triamcinolone ointment [facility]) Apply to scalp and face twice a day for up to 1 week and stop Patient not taking: Reported on 06/26/2023 11/23/22   Terri Piedra, DO      Allergies    Patient has no known allergies.    Review of Systems   Review of Systems  Physical Exam Updated Vital Signs BP 139/81   Pulse 69   Temp 98.8 F (37.1 C) (Oral)   Resp 16   Ht 5\' 11"  (1.803 m)   Wt 67.6 kg   SpO2 100%   BMI 20.78 kg/m  Physical Exam Vitals and nursing note reviewed.  Constitutional:      General: He is not in acute distress.    Appearance: Normal appearance.  HENT:     Head: Normocephalic.     Comments: Right forehead hematoma with superficial nongaping, nonbleeding stellate laceration    Nose: Nose normal.     Mouth/Throat:     Mouth: Mucous membranes are moist.     Pharynx: Oropharynx is clear.  Eyes:     Extraocular  Movements: Extraocular movements intact.     Conjunctiva/sclera: Conjunctivae normal.     Pupils: Pupils are equal, round, and reactive to light.  Neck:     Comments: No midline neck tenderness Cardiovascular:     Rate and Rhythm: Normal rate and regular rhythm.     Heart sounds: Normal heart sounds.  Pulmonary:     Effort: Pulmonary effort is normal.     Breath sounds: Normal breath sounds.  Abdominal:     General: Abdomen is flat.     Palpations: Abdomen is soft.     Tenderness: There is no abdominal tenderness.  Musculoskeletal:        General: Normal range of motion.  Cervical back: Normal range of motion and neck supple.     Comments: No midline back tenderness No bony tenderness to bilateral upper or lower extremities Pelvis stable, nontender  Skin:    General: Skin is warm and dry.  Neurological:     General: No focal deficit present.     Mental Status: He is alert and oriented to person, place, and time.     Sensory: No sensory deficit.     Motor: No weakness.  Psychiatric:        Mood and Affect: Mood normal.        Behavior: Behavior normal.     ED Results / Procedures / Treatments   Labs (all labs ordered are listed, but only abnormal results are displayed) Labs Reviewed  CBG MONITORING, ED    EKG EKG Interpretation Date/Time:  Monday June 26 2023 07:00:36 EDT Ventricular Rate:  64 PR Interval:  128 QRS Duration:  77 QT Interval:  419 QTC Calculation: 433 R Axis:   84  Text Interpretation: Sinus rhythm Consider left atrial enlargement Borderline right axis deviation No significant change was found Confirmed by Elayne Snare ([id number]) on 06/26/2023 7:02:42 AM  Radiology CT Head Wo Contrast Result Date: 06/26/2023 CLINICAL DATA:  Trauma, brought by EMS from rehab, JP drain dislodged. EXAM: CT HEAD WITHOUT CONTRAST CT CERVICAL SPINE WITHOUT CONTRAST TECHNIQUE: Multidetector CT imaging of the head and cervical spine was performed following the  standard protocol without intravenous contrast. Multiplanar CT image reconstructions of the cervical spine were also generated. RADIATION DOSE REDUCTION: This exam was performed according to the departmental dose-optimization program which includes automated exposure control, adjustment of the mA and/or kV according to patient size and/or use of iterative reconstruction technique. COMPARISON:  06/20/2023 FINDINGS: CT HEAD FINDINGS Brain: No acute intracranial hemorrhage. No CT evidence of acute infarct. 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 Hypodense foci in the inferior aspect of the basal ganglia, likely prominent perivascular spaces. Similar appearance of sellar mass measuring up to 1.8 cm. No edema, mass effect, or midline shift. The basilar cisterns are patent. Ventricles: Prominence of the ventricles suggesting underlying parenchymal volume loss. Vascular: Atherosclerotic calcifications of the carotid siphons. No hyperdense vessel. Skull: No acute or aggressive finding. Orbits: Orbits are symmetric. Sinuses: Mild mucosal thickening in the maxillary sinuses. Other: Mastoid air cells are clear. Small right frontal scalp hematoma. CT CERVICAL SPINE FINDINGS Alignment: Straightening of the normal cervical lordosis. No listhesis. No facet subluxation or dislocation. Skull base and vertebrae: No compression fracture or displaced fracture in the cervical spine. Anterior cervical fusion hardware from C4-C6. Additional plate and screw hardware along the posterior elements on the left from C3 to C7. No suspicious osseous lesion. Soft tissues and spinal canal: No prevertebral fluid or swelling. No visible canal hematoma. Redemonstrated left thyroid nodules measuring greater than 2 cm in diameter. Disc levels: Mild intervertebral disc space narrowing at C3-4 and C6-7 with associated degenerative endplate changes. Disc bulges and disc osteophyte complexes at multiple levels. No high-grade osseous spinal  canal stenosis. Facet arthrosis at multiple levels. Foraminal narrowing at multiple levels throughout the cervical spine greatest at C3-4 and C6-7. Upper chest: Negative. Other: None. IMPRESSION: No CT evidence of acute intracranial abnormality. Small right frontal scalp hematoma. Unchanged pituitary/sellar mass. No acute fracture or traumatic malalignment in the cervical spine. Degenerative changes and postsurgical changes as above. Similar appearance of left thyroid nodule. Reiterate recommendation for thyroid ultrasound if not previously performed. Electronically Signed  By: Emily Filbert M.D.   On: 06/26/2023 09:33   CT Cervical Spine Wo Contrast Result Date: 06/26/2023 CLINICAL DATA:  Trauma, brought by EMS from rehab, JP drain dislodged. EXAM: CT HEAD WITHOUT CONTRAST CT CERVICAL SPINE WITHOUT CONTRAST TECHNIQUE: Multidetector CT imaging of the head and cervical spine was performed following the standard protocol without intravenous contrast. Multiplanar CT image reconstructions of the cervical spine were also generated. RADIATION DOSE REDUCTION: This exam was performed according to the departmental dose-optimization program which includes automated exposure control, adjustment of the mA and/or kV according to patient size and/or use of iterative reconstruction technique. COMPARISON:  06/20/2023 FINDINGS: CT HEAD FINDINGS Brain: No acute intracranial hemorrhage. No CT evidence of acute infarct. 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 Hypodense foci in the inferior aspect of the basal ganglia, likely prominent perivascular spaces. Similar appearance of sellar mass measuring up to 1.8 cm. No edema, mass effect, or midline shift. The basilar cisterns are patent. Ventricles: Prominence of the ventricles suggesting underlying parenchymal volume loss. Vascular: Atherosclerotic calcifications of the carotid siphons. No hyperdense vessel. Skull: No acute or aggressive finding. Orbits: Orbits are  symmetric. Sinuses: Mild mucosal thickening in the maxillary sinuses. Other: Mastoid air cells are clear. Small right frontal scalp hematoma. CT CERVICAL SPINE FINDINGS Alignment: Straightening of the normal cervical lordosis. No listhesis. No facet subluxation or dislocation. Skull base and vertebrae: No compression fracture or displaced fracture in the cervical spine. Anterior cervical fusion hardware from C4-C6. Additional plate and screw hardware along the posterior elements on the left from C3 to C7. No suspicious osseous lesion. Soft tissues and spinal canal: No prevertebral fluid or swelling. No visible canal hematoma. Redemonstrated left thyroid nodules measuring greater than 2 cm in diameter. Disc levels: Mild intervertebral disc space narrowing at C3-4 and C6-7 with associated degenerative endplate changes. Disc bulges and disc osteophyte complexes at multiple levels. No high-grade osseous spinal canal stenosis. Facet arthrosis at multiple levels. Foraminal narrowing at multiple levels throughout the cervical spine greatest at C3-4 and C6-7. Upper chest: Negative. Other: None. IMPRESSION: No CT evidence of acute intracranial abnormality. Small right frontal scalp hematoma. Unchanged pituitary/sellar mass. No acute fracture or traumatic malalignment in the cervical spine. Degenerative changes and postsurgical changes as above. Similar appearance of left thyroid nodule. Reiterate recommendation for thyroid ultrasound if not previously performed. Electronically Signed   By: Emily Filbert M.D.   On: 06/26/2023 09:33    Procedures Procedures    Medications Ordered in ED Medications  Tdap (BOOSTRIX) injection 0.5 mL (0.5 mLs Intramuscular Given 06/26/23 0829)    ED Course/ Medical Decision Making/ A&P Clinical Course as of 06/26/23 0950  Mon Jun 26, 2023  0937 No acute traumatic injury on CT imaging. Does have a stable thyroid nodule that needs follow up if not done already. Patient is stable for  discharge back to his SNF. 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 The complaint involves an extensive differential diagnosis and also carries with it a high risk of complications and morbidity.    The differential  diagnosis includes due to patient's age and head injury concern for ICH, mass effect, cervical spine fracture, no other traumatic injury seen on exam, no presyncopal symptoms making syncopal fall unlikely  Additional history obtained: Additional history obtained from family Records reviewed previous admission documents and Nursing Home Documents  ED Course and Reassessment: On patient's arrival he is hemodynamically stable in no acute distress.  Due to patient's age and fall will have CT head and C-spine performed.  No other traumatic injury seen.  EKG on arrival showed normal sinus rhythm without acute ischemic changes.  Patient had no presyncopal symptoms a syncopal fall unlikely.  Patient is unsure when his tetanus was last updated and will have update today.  Patient's laceration is superficial and not gaping does not require laceration repair today.  Independent labs interpretation:  N/A  Independent visualization of imaging: - I independently visualized the following imaging with scope of interpretation limited to determining acute life threatening conditions related to emergency care: CTH/C-spine, which revealed no acute traumatic injury   Consultation: - Consulted or discussed management/test interpretation w/ external professional: N/A  Consideration for admission or further workup: Patient has no emergent conditions requiring admission or further work-up at this time and is  stable for discharge home with primary care follow-up  Social Determinants of health: N/A    Amount and/or Complexity of Data Reviewed Radiology: ordered.  Risk Prescription drug management.          Final Clinical Impression(s) / ED Diagnoses Final diagnoses:  Fall, initial encounter  Traumatic hematoma of forehead, initial encounter  Thyroid nodule    Rx / DC Orders ED Discharge Orders     None         Rexford Maus, DO 06/26/23 [phone]

## 2023-06-27 ENCOUNTER — Encounter: Payer: Self-pay | Admitting: Family Medicine

## 2023-06-27 ENCOUNTER — Telehealth: Payer: Self-pay

## 2023-06-27 DIAGNOSIS — C7A8 Other malignant neuroendocrine tumors: Secondary | ICD-10-CM | POA: Diagnosis not present

## 2023-06-27 DIAGNOSIS — E1165 Type 2 diabetes mellitus with hyperglycemia: Secondary | ICD-10-CM | POA: Diagnosis not present

## 2023-06-27 DIAGNOSIS — R41841 Cognitive communication deficit: Secondary | ICD-10-CM | POA: Diagnosis not present

## 2023-06-27 DIAGNOSIS — R1312 Dysphagia, oropharyngeal phase: Secondary | ICD-10-CM | POA: Diagnosis not present

## 2023-06-27 DIAGNOSIS — R2689 Other abnormalities of gait and mobility: Secondary | ICD-10-CM | POA: Diagnosis not present

## 2023-06-27 DIAGNOSIS — M6281 Muscle weakness (generalized): Secondary | ICD-10-CM | POA: Diagnosis not present

## 2023-06-27 NOTE — Transitions of Care (Post Inpatient/ED Visit) (Signed)
   06/27/2023  Name: Andrew Tanner MRN: [id number] DOB: March 01, 1951  Today's TOC FU Call Status: Today's TOC FU Call Status:: Unsuccessful Call (1st Attempt) Unsuccessful Call (1st Attempt) Date: 06/27/23  Attempted to reach the patient regarding the most recent Inpatient/ED visit.  Follow Up Plan: Additional outreach attempts will be made to reach the patient to complete the Transitions of Care (Post Inpatient/ED visit) call.   Signature Karena Addison, LPN [facility] Nurse Health Advisor Direct Dial [phone]

## 2023-06-29 ENCOUNTER — Telehealth: Payer: Self-pay | Admitting: *Deleted

## 2023-06-29 DIAGNOSIS — E1165 Type 2 diabetes mellitus with hyperglycemia: Secondary | ICD-10-CM | POA: Diagnosis not present

## 2023-06-29 DIAGNOSIS — Z794 Long term (current) use of insulin: Secondary | ICD-10-CM | POA: Diagnosis not present

## 2023-06-29 DIAGNOSIS — R1312 Dysphagia, oropharyngeal phase: Secondary | ICD-10-CM | POA: Diagnosis not present

## 2023-06-29 DIAGNOSIS — I10 Essential (primary) hypertension: Secondary | ICD-10-CM | POA: Diagnosis not present

## 2023-06-29 DIAGNOSIS — E1142 Type 2 diabetes mellitus with diabetic polyneuropathy: Secondary | ICD-10-CM | POA: Diagnosis not present

## 2023-06-29 DIAGNOSIS — E785 Hyperlipidemia, unspecified: Secondary | ICD-10-CM | POA: Diagnosis not present

## 2023-06-29 DIAGNOSIS — Z604 Social exclusion and rejection: Secondary | ICD-10-CM | POA: Diagnosis not present

## 2023-06-29 DIAGNOSIS — C179 Malignant neoplasm of small intestine, unspecified: Secondary | ICD-10-CM | POA: Diagnosis not present

## 2023-06-29 DIAGNOSIS — C7A8 Other malignant neuroendocrine tumors: Secondary | ICD-10-CM | POA: Diagnosis not present

## 2023-06-29 DIAGNOSIS — H269 Unspecified cataract: Secondary | ICD-10-CM | POA: Diagnosis not present

## 2023-06-29 DIAGNOSIS — R41841 Cognitive communication deficit: Secondary | ICD-10-CM | POA: Diagnosis not present

## 2023-06-29 NOTE — Telephone Encounter (Signed)
 Copied from CRM [id number]. Topic: Clinical - Home Health Verbal Orders >> Jun 29, 2023 11:02 AM Denese Killings wrote: Caller/Agency: [facility]  Callback Number: [phone] confidential voicemail  Service Requested: Physical Therapy and Speech Pathology evaluation Frequency: 1x a week for 8 weeks for Physical Therapy  Any new concerns about the patient? No  Verbal orders given as request per pcp.

## 2023-06-29 NOTE — Transitions of Care (Post Inpatient/ED Visit) (Signed)
   06/29/2023  Name: Andrew Tanner MRN: [id number] DOB: 15-Mar-1951  Today's TOC FU Call Status: Today's TOC FU Call Status:: Unsuccessful Call (2nd Attempt) Unsuccessful Call (1st Attempt) Date: 06/27/23 Unsuccessful Call (2nd Attempt) Date: 06/29/23  Attempted to reach the patient regarding the most recent Inpatient/ED visit.  Follow Up Plan: Additional outreach attempts will be made to reach the patient to complete the Transitions of Care (Post Inpatient/ED visit) call.   Signature Karena Addison, LPN [facility] Nurse Health Advisor Direct Dial [phone]

## 2023-07-03 ENCOUNTER — Inpatient Hospital Stay: Admitting: Family Medicine

## 2023-07-03 DIAGNOSIS — C7A8 Other malignant neuroendocrine tumors: Secondary | ICD-10-CM | POA: Diagnosis not present

## 2023-07-03 DIAGNOSIS — C179 Malignant neoplasm of small intestine, unspecified: Secondary | ICD-10-CM | POA: Diagnosis not present

## 2023-07-03 DIAGNOSIS — E785 Hyperlipidemia, unspecified: Secondary | ICD-10-CM | POA: Diagnosis not present

## 2023-07-03 DIAGNOSIS — R41841 Cognitive communication deficit: Secondary | ICD-10-CM | POA: Diagnosis not present

## 2023-07-03 DIAGNOSIS — R1312 Dysphagia, oropharyngeal phase: Secondary | ICD-10-CM | POA: Diagnosis not present

## 2023-07-03 DIAGNOSIS — Z794 Long term (current) use of insulin: Secondary | ICD-10-CM | POA: Diagnosis not present

## 2023-07-03 DIAGNOSIS — H269 Unspecified cataract: Secondary | ICD-10-CM | POA: Diagnosis not present

## 2023-07-03 DIAGNOSIS — E1142 Type 2 diabetes mellitus with diabetic polyneuropathy: Secondary | ICD-10-CM | POA: Diagnosis not present

## 2023-07-03 DIAGNOSIS — I10 Essential (primary) hypertension: Secondary | ICD-10-CM | POA: Diagnosis not present

## 2023-07-03 DIAGNOSIS — E1165 Type 2 diabetes mellitus with hyperglycemia: Secondary | ICD-10-CM | POA: Diagnosis not present

## 2023-07-03 DIAGNOSIS — Z604 Social exclusion and rejection: Secondary | ICD-10-CM | POA: Diagnosis not present

## 2023-07-03 NOTE — Transitions of Care (Post Inpatient/ED Visit) (Signed)
   07/03/2023  Name: Veron Senner MRN: [id number] DOB: Feb 27, 1951  Today's TOC FU Call Status: Today's TOC FU Call Status:: Unsuccessful Call (3rd Attempt) Unsuccessful Call (1st Attempt) Date: 06/27/23 Unsuccessful Call (2nd Attempt) Date: 06/29/23 Unsuccessful Call (3rd Attempt) Date: 07/03/23  Attempted to reach the patient regarding the most recent Inpatient/ED visit.  Follow Up Plan: No further outreach attempts will be made at this time. We have been unable to contact the patient.  Signature Karena Addison, LPN [facility] Nurse Health Advisor Direct Dial [phone]

## 2023-07-05 ENCOUNTER — Inpatient Hospital Stay: Admitting: Family Medicine

## 2023-07-06 DIAGNOSIS — I10 Essential (primary) hypertension: Secondary | ICD-10-CM | POA: Diagnosis not present

## 2023-07-06 DIAGNOSIS — C7A8 Other malignant neuroendocrine tumors: Secondary | ICD-10-CM | POA: Diagnosis not present

## 2023-07-06 DIAGNOSIS — Z604 Social exclusion and rejection: Secondary | ICD-10-CM | POA: Diagnosis not present

## 2023-07-06 DIAGNOSIS — R41841 Cognitive communication deficit: Secondary | ICD-10-CM | POA: Diagnosis not present

## 2023-07-06 DIAGNOSIS — E785 Hyperlipidemia, unspecified: Secondary | ICD-10-CM | POA: Diagnosis not present

## 2023-07-06 DIAGNOSIS — E1165 Type 2 diabetes mellitus with hyperglycemia: Secondary | ICD-10-CM | POA: Diagnosis not present

## 2023-07-06 DIAGNOSIS — C179 Malignant neoplasm of small intestine, unspecified: Secondary | ICD-10-CM | POA: Diagnosis not present

## 2023-07-06 DIAGNOSIS — R1312 Dysphagia, oropharyngeal phase: Secondary | ICD-10-CM | POA: Diagnosis not present

## 2023-07-06 DIAGNOSIS — Z794 Long term (current) use of insulin: Secondary | ICD-10-CM | POA: Diagnosis not present

## 2023-07-06 DIAGNOSIS — E1142 Type 2 diabetes mellitus with diabetic polyneuropathy: Secondary | ICD-10-CM | POA: Diagnosis not present

## 2023-07-06 DIAGNOSIS — H269 Unspecified cataract: Secondary | ICD-10-CM | POA: Diagnosis not present

## 2023-07-07 ENCOUNTER — Ambulatory Visit: Payer: Medicare Other | Admitting: Cardiovascular Disease

## 2023-07-07 ENCOUNTER — Telehealth: Payer: Self-pay | Admitting: Family Medicine

## 2023-07-07 NOTE — Telephone Encounter (Signed)
 Verbal orders given to Theora Gianotti at [facility] for Speech Therapy as requested per pcp.

## 2023-07-07 NOTE — Telephone Encounter (Unsigned)
 Copied from CRM [id number]. Topic: Clinical - Home Health Verbal Orders >> Jul 06, 2023  4:50 PM Ernst Spell wrote: Caller/Agency: Theora Gianotti w/ [facility] Callback Number: [phone] Service Requested: Speech Therapy Frequency: 1x week for 8 weeks Any new concerns about the patient? No

## 2023-07-11 DIAGNOSIS — C179 Malignant neoplasm of small intestine, unspecified: Secondary | ICD-10-CM | POA: Diagnosis not present

## 2023-07-11 DIAGNOSIS — E1142 Type 2 diabetes mellitus with diabetic polyneuropathy: Secondary | ICD-10-CM | POA: Diagnosis not present

## 2023-07-11 DIAGNOSIS — R1312 Dysphagia, oropharyngeal phase: Secondary | ICD-10-CM | POA: Diagnosis not present

## 2023-07-11 DIAGNOSIS — I10 Essential (primary) hypertension: Secondary | ICD-10-CM | POA: Diagnosis not present

## 2023-07-11 DIAGNOSIS — E785 Hyperlipidemia, unspecified: Secondary | ICD-10-CM | POA: Diagnosis not present

## 2023-07-11 DIAGNOSIS — C7A8 Other malignant neuroendocrine tumors: Secondary | ICD-10-CM | POA: Diagnosis not present

## 2023-07-11 DIAGNOSIS — H269 Unspecified cataract: Secondary | ICD-10-CM | POA: Diagnosis not present

## 2023-07-11 DIAGNOSIS — Z604 Social exclusion and rejection: Secondary | ICD-10-CM | POA: Diagnosis not present

## 2023-07-11 DIAGNOSIS — R41841 Cognitive communication deficit: Secondary | ICD-10-CM | POA: Diagnosis not present

## 2023-07-11 DIAGNOSIS — Z794 Long term (current) use of insulin: Secondary | ICD-10-CM | POA: Diagnosis not present

## 2023-07-11 DIAGNOSIS — E1165 Type 2 diabetes mellitus with hyperglycemia: Secondary | ICD-10-CM | POA: Diagnosis not present

## 2023-07-12 ENCOUNTER — Encounter (HOSPITAL_COMMUNITY): Payer: Self-pay | Admitting: *Deleted

## 2023-07-12 ENCOUNTER — Emergency Department (HOSPITAL_COMMUNITY)

## 2023-07-12 ENCOUNTER — Inpatient Hospital Stay (HOSPITAL_COMMUNITY)
Admission: EM | Admit: 2023-07-12 | Discharge: 2023-07-19 | DRG: 522 | Disposition: A | Discharge status: Home Health | Attending: Internal Medicine | Admitting: Internal Medicine

## 2023-07-12 ENCOUNTER — Other Ambulatory Visit: Payer: Self-pay

## 2023-07-12 DIAGNOSIS — Z471 Aftercare following joint replacement surgery: Secondary | ICD-10-CM | POA: Diagnosis not present

## 2023-07-12 DIAGNOSIS — F419 Anxiety disorder, unspecified: Secondary | ICD-10-CM | POA: Diagnosis present

## 2023-07-12 DIAGNOSIS — E1140 Type 2 diabetes mellitus with diabetic neuropathy, unspecified: Secondary | ICD-10-CM | POA: Diagnosis not present

## 2023-07-12 DIAGNOSIS — N3010 Interstitial cystitis (chronic) without hematuria: Secondary | ICD-10-CM | POA: Diagnosis present

## 2023-07-12 DIAGNOSIS — Z833 Family history of diabetes mellitus: Secondary | ICD-10-CM

## 2023-07-12 DIAGNOSIS — Z96642 Presence of left artificial hip joint: Secondary | ICD-10-CM | POA: Diagnosis not present

## 2023-07-12 DIAGNOSIS — F418 Other specified anxiety disorders: Secondary | ICD-10-CM | POA: Diagnosis not present

## 2023-07-12 DIAGNOSIS — Z85828 Personal history of other malignant neoplasm of skin: Secondary | ICD-10-CM | POA: Diagnosis not present

## 2023-07-12 DIAGNOSIS — M80052A Age-related osteoporosis with current pathological fracture, left femur, initial encounter for fracture: Secondary | ICD-10-CM | POA: Diagnosis not present

## 2023-07-12 DIAGNOSIS — E872 Acidosis, unspecified: Secondary | ICD-10-CM | POA: Diagnosis not present

## 2023-07-12 DIAGNOSIS — D490 Neoplasm of unspecified behavior of digestive system: Secondary | ICD-10-CM | POA: Diagnosis present

## 2023-07-12 DIAGNOSIS — I10 Essential (primary) hypertension: Secondary | ICD-10-CM | POA: Diagnosis present

## 2023-07-12 DIAGNOSIS — E43 Unspecified severe protein-calorie malnutrition: Secondary | ICD-10-CM | POA: Diagnosis present

## 2023-07-12 DIAGNOSIS — Z818 Family history of other mental and behavioral disorders: Secondary | ICD-10-CM

## 2023-07-12 DIAGNOSIS — E1142 Type 2 diabetes mellitus with diabetic polyneuropathy: Secondary | ICD-10-CM | POA: Diagnosis not present

## 2023-07-12 DIAGNOSIS — E1165 Type 2 diabetes mellitus with hyperglycemia: Secondary | ICD-10-CM | POA: Diagnosis present

## 2023-07-12 DIAGNOSIS — Z8249 Family history of ischemic heart disease and other diseases of the circulatory system: Secondary | ICD-10-CM | POA: Diagnosis not present

## 2023-07-12 DIAGNOSIS — K5900 Constipation, unspecified: Secondary | ICD-10-CM | POA: Diagnosis not present

## 2023-07-12 DIAGNOSIS — Z8051 Family history of malignant neoplasm of kidney: Secondary | ICD-10-CM | POA: Diagnosis not present

## 2023-07-12 DIAGNOSIS — Z801 Family history of malignant neoplasm of trachea, bronchus and lung: Secondary | ICD-10-CM

## 2023-07-12 DIAGNOSIS — Z83438 Family history of other disorder of lipoprotein metabolism and other lipidemia: Secondary | ICD-10-CM | POA: Diagnosis not present

## 2023-07-12 DIAGNOSIS — C7A8 Other malignant neuroendocrine tumors: Secondary | ICD-10-CM | POA: Diagnosis not present

## 2023-07-12 DIAGNOSIS — S72002A Fracture of unspecified part of neck of left femur, initial encounter for closed fracture: Secondary | ICD-10-CM | POA: Diagnosis not present

## 2023-07-12 DIAGNOSIS — D62 Acute posthemorrhagic anemia: Secondary | ICD-10-CM | POA: Diagnosis not present

## 2023-07-12 DIAGNOSIS — F32A Depression, unspecified: Secondary | ICD-10-CM | POA: Diagnosis not present

## 2023-07-12 DIAGNOSIS — E1159 Type 2 diabetes mellitus with other circulatory complications: Secondary | ICD-10-CM | POA: Diagnosis not present

## 2023-07-12 DIAGNOSIS — Z794 Long term (current) use of insulin: Secondary | ICD-10-CM

## 2023-07-12 DIAGNOSIS — E785 Hyperlipidemia, unspecified: Secondary | ICD-10-CM | POA: Diagnosis not present

## 2023-07-12 DIAGNOSIS — W1830XA Fall on same level, unspecified, initial encounter: Secondary | ICD-10-CM | POA: Diagnosis present

## 2023-07-12 DIAGNOSIS — K5730 Diverticulosis of large intestine without perforation or abscess without bleeding: Secondary | ICD-10-CM | POA: Diagnosis not present

## 2023-07-12 DIAGNOSIS — Z87891 Personal history of nicotine dependence: Secondary | ICD-10-CM | POA: Diagnosis not present

## 2023-07-12 DIAGNOSIS — Z79899 Other long term (current) drug therapy: Secondary | ICD-10-CM

## 2023-07-12 DIAGNOSIS — W19XXXA Unspecified fall, initial encounter: Secondary | ICD-10-CM | POA: Diagnosis not present

## 2023-07-12 DIAGNOSIS — G629 Polyneuropathy, unspecified: Secondary | ICD-10-CM | POA: Diagnosis not present

## 2023-07-12 DIAGNOSIS — Z6820 Body mass index (BMI) 20.0-20.9, adult: Secondary | ICD-10-CM | POA: Diagnosis not present

## 2023-07-12 DIAGNOSIS — S79912A Unspecified injury of left hip, initial encounter: Secondary | ICD-10-CM | POA: Diagnosis not present

## 2023-07-12 DIAGNOSIS — S72042A Displaced fracture of base of neck of left femur, initial encounter for closed fracture: Secondary | ICD-10-CM | POA: Diagnosis not present

## 2023-07-12 DIAGNOSIS — R609 Edema, unspecified: Secondary | ICD-10-CM | POA: Diagnosis not present

## 2023-07-12 DIAGNOSIS — E119 Type 2 diabetes mellitus without complications: Secondary | ICD-10-CM | POA: Diagnosis not present

## 2023-07-12 DIAGNOSIS — E440 Moderate protein-calorie malnutrition: Secondary | ICD-10-CM | POA: Diagnosis present

## 2023-07-12 DIAGNOSIS — M8580 Other specified disorders of bone density and structure, unspecified site: Secondary | ICD-10-CM | POA: Diagnosis not present

## 2023-07-12 DIAGNOSIS — G709 Myoneural disorder, unspecified: Secondary | ICD-10-CM | POA: Diagnosis not present

## 2023-07-12 DIAGNOSIS — M25552 Pain in left hip: Secondary | ICD-10-CM | POA: Diagnosis not present

## 2023-07-12 LAB — BASIC METABOLIC PANEL WITH GFR
Anion gap: 7 (ref 5–15)
BUN: 12 mg/dL (ref 8–23)
CO2: 19 mmol/L — ABNORMAL LOW (ref 22–32)
Calcium: 9.1 mg/dL (ref 8.9–10.3)
Chloride: 109 mmol/L (ref 98–111)
Creatinine, Ser: 0.76 mg/dL (ref 0.61–1.24)
GFR, Estimated: >60 mL/min (ref >60)
Glucose, Bld: 214 mg/dL — ABNORMAL HIGH (ref 70–99)
Potassium: 4 mmol/L (ref 3.5–5.1)
Sodium: 135 mmol/L (ref 135–145)

## 2023-07-12 LAB — CBC
HCT: 35 % — ABNORMAL LOW (ref 39.0–52.0)
Hemoglobin: 11.6 g/dL — ABNORMAL LOW (ref 13.0–17.0)
MCH: 28.6 pg (ref 26.0–34.0)
MCHC: 33.1 g/dL (ref 30.0–36.0)
MCV: 86.4 fL (ref 80.0–100.0)
Platelets: 227 10*3/uL (ref 150–400)
RBC: 4.05 MIL/uL — ABNORMAL LOW (ref 4.22–5.81)
RDW: 14 % (ref 11.5–15.5)
WBC: 9.7 10*3/uL (ref 4.0–10.5)
nRBC: 0 % (ref 0.0–0.2)

## 2023-07-12 LAB — CBG MONITORING, ED: Glucose-Capillary: 178 mg/dL — ABNORMAL HIGH (ref 70–99)

## 2023-07-12 NOTE — ED Triage Notes (Signed)
 Pt arrives via [facility] from group home ([address] in [place]). Pt fell this morning around 7am while doing exercises, been having left hip pain all day. No shortening, minimal articulation, pain on palpation. IV established in the left AC, given 25 fentanyl en route. 138/79, hr 99, 98% RA, RR16

## 2023-07-12 NOTE — ED Provider Notes (Signed)
 [place] EMERGENCY DEPARTMENT AT [facility] Provider Note   CSN: [id number] Arrival date & time: 07/12/23  2057     History {Add pertinent medical, surgical, social history, OB history to HPI:1} Chief Complaint  Patient presents with   Hip Pain    Andrew Tanner is a 73 y.o. male.   Hip Pain   73 year old male presents emergency department with complaints of left hip pain.  Patient is in rehab facility.  States he recently had Whipple's procedure done in January and is there to help regain weight as well as strength.  States that he was trying to get up using his walker when his legs became weak and he fell directly on his left hip.  Denies trauma to head, LOC, blood thinner use.  Denies any pain elsewhere.  Has been unable to ambulate ever since the incident.  Denies any chest pain, shortness of breath, abdominal pain, nausea, vomiting.  Presents emergency department for further assessment.  Past medical history significant for basal cell carcinoma, diabetes mellitus, diabetic neuropathy, neuromuscular disorder, hyperlipidemia, malignant neuroendocrine tumor of duodenum  Home Medications Prior to Admission medications   Medication Sig Start Date End Date Taking? Authorizing Provider  acetaminophen (TYLENOL) 500 MG tablet Take 2 tablets (1,000 mg total) by mouth every 6 (six) hours as needed for mild pain (pain score 1-3). 05/10/23   [name], MD  alfuzosin (UROXATRAL) 10 MG 24 hr tablet Take 1 tablet (10 mg total) by mouth daily with breakfast. 03/20/23   Jeani Sow, MD  B-D UF III MINI PEN NEEDLES 31G X 5 MM MISC USE DAILY AT 12 NOON 03/31/23   Jeani Sow, MD  clonazePAM (KLONOPIN) 0.5 MG tablet Take 1.5 mg by mouth at bedtime.    [provider]  Continuous Glucose Sensor (DEXCOM G7 SENSOR) MISC Use to monitor glucose continuously, change every 10 days 02/22/23   Carlus Pavlov, MD  feeding supplement (ENSURE ENLIVE / ENSURE PLUS) LIQD Take  237 mLs by mouth 2 (two) times daily between meals. Patient not taking: Reported on 06/26/2023 05/10/23   [name], MD  FLUoxetine (PROZAC) 20 MG capsule Take 60 mg by mouth every morning. 12/19/21   [provider]  glucose blood test strip 1 each by Other route in the morning and at bedtime. Use as instructed checking once per day, or if new symptoms. 01/16/23   Jeani Sow, MD  hydrOXYzine (ATARAX/VISTARIL) 50 MG tablet Take 50 mg by mouth at bedtime as needed (sleep/anxiety.). 12/16/20   [provider]  insulin aspart (NOVOLOG) 100 UNIT/ML injection Inject 4 Units into the skin 3 (three) times daily with meals. 06/08/23   [name], MD  insulin glargine (LANTUS SOLOSTAR) 100 UNIT/ML Solostar Pen Inject 12 Units into the skin at bedtime. 06/08/23   [name], MD  Insulin Lispro-aabc (LYUMJEV KWIKPEN) 100 UNIT/ML KwikPen Inject 3-6 units under skin of insulin before dinner Patient not taking: Reported on 06/26/2023 02/08/23   Carlus Pavlov, MD  Insulin Pen Needle 32G X 4 MM MISC Use 2x a day 02/08/23   Carlus Pavlov, MD  ketoconazole (NIZORAL) 2 % shampoo APPLY TO SCALP AND FACE 2 TO 3 TIMES A WEEK. Patient taking differently: Apply 1 Application topically See admin instructions. APPLY TO SCALP AND FACE 2 TO 3 TIMES A WEEK. 04/16/23   Terri Piedra, DO  lipase/protease/amylase (CREON) 36000 UNITS CPEP capsule Take 2 capsules (72,000 Units total) by mouth 3 (three) times daily before  meals. 05/18/23   [name], MD  megestrol (MEGACE) 400 MG/10ML suspension Take 10 mLs (400 mg total) by mouth daily. 05/10/23   [name], MD  methocarbamol (ROBAXIN) 500 MG tablet Take 1 tablet (500 mg total) by mouth every 6 (six) hours as needed for muscle spasms (pain). 05/10/23   [name], MD  Multiple Vitamin (MULTIVITAMIN WITH MINERALS) TABS tablet Take 1 tablet by mouth daily. Patient not taking: Reported on 06/26/2023 06/09/23   [name], MD  Nystatin  (GERHARDT'S BUTT CREAM) CREA Apply 1 Application topically 2 (two) times daily. Patient not taking: Reported on 06/26/2023 06/08/23   [name], MD  nystatin-triamcinolone ointment [facility]) Apply to scalp and face twice a day for up to 1 week and stop Patient not taking: Reported on 06/26/2023 11/23/22   Terri Piedra, DO  oxyCODONE (OXY IR/ROXICODONE) 5 MG immediate release tablet Take 1 tablet (5 mg total) by mouth every 4 (four) hours as needed for severe pain (pain score 7-10), breakthrough pain or moderate pain (pain score 4-6). 05/10/23   [name], MD  pantoprazole (PROTONIX) 40 MG tablet Take 1 tablet (40 mg total) by mouth at bedtime. 05/10/23   [name], MD  polyethylene glycol (MIRALAX / GLYCOLAX) 17 g packet Take 17 g by mouth daily as needed (constipation.).    [provider]  prochlorperazine (COMPAZINE) 10 MG tablet Take 1 tablet (10 mg total) by mouth every 6 (six) hours as needed for nausea or vomiting (Use for nausea and / or vomiting unresolved with ondansetron (Zofran).). 05/10/23   [name], MD  rosuvastatin (CRESTOR) 20 MG tablet Take 1 tablet (20 mg total) by mouth daily. 05/10/23   [name], MD  simethicone (MYLICON) 80 MG chewable tablet Chew 1 tablet (80 mg total) by mouth every 6 (six) hours as needed for flatulence. 05/10/23   [name], MD      Allergies    Patient has no known allergies.    Review of Systems   Review of Systems  All other systems reviewed and are negative.   Physical Exam Updated Vital Signs BP (!) 140/80 (BP Location: Left Arm)   Pulse 97   Temp 100.1 F (37.8 C)   Resp 18   Ht 5\' 11"  (1.803 m)   Wt 68 kg   SpO2 99%   BMI 20.92 kg/m  Physical Exam Vitals and nursing note reviewed.  Constitutional:      General: He is not in acute distress.    Appearance: He is well-developed.  HENT:     Head: Normocephalic and atraumatic.  Eyes:     Conjunctiva/sclera: Conjunctivae normal.  Cardiovascular:      Rate and Rhythm: Normal rate and regular rhythm.  Pulmonary:     Effort: Pulmonary effort is normal. No respiratory distress.     Breath sounds: Normal breath sounds.  Abdominal:     Palpations: Abdomen is soft.     Tenderness: There is no abdominal tenderness.  Musculoskeletal:        General: No swelling.     Cervical back: Neck supple.     Comments: Tender palpation left proximal hip.  Pedal pulses 2+ bilaterally.  Noted tenderness bilateral upper lower extremities.  No chest wall tenderness.  No midline tenderness cervical, thoracic and lumbar spine.  Skin:    General: Skin is warm and dry.     Capillary Refill: Capillary refill takes less than 2 seconds.  Neurological:     Mental Status:  He is alert.  Psychiatric:        Mood and Affect: Mood normal.     ED Results / Procedures / Treatments   Labs (all labs ordered are listed, but only abnormal results are displayed) Labs Reviewed  CBG MONITORING, ED - Abnormal; Notable for the following components:      Result Value   Glucose-Capillary 178 (*)    All other components within normal limits  BASIC METABOLIC PANEL WITH GFR  CBC    EKG None  Radiology No results found.  Procedures Procedures  {Document cardiac monitor, telemetry assessment procedure when appropriate:1}  Medications Ordered in ED Medications - No data to display  ED Course/ Medical Decision Making/ A&P Clinical Course as of 07/12/23 2319  Wed Jul 12, 2023  2309 Consulted Dr. Eulah Pont of orthopedics who recommended n.p.o. after midnight and admission through hospitalist. [CR]    Clinical Course User Index [CR] Peter Garter, PA   {   Click here for ABCD2, HEART and other calculatorsREFRESH Note before signing :1}                              Medical Decision Making Amount and/or Complexity of Data Reviewed Labs: ordered. Radiology: ordered.  Risk Decision regarding hospitalization.   This patient presents to the ED for concern of  left hip pain, this involves an extensive number of treatment options, and is a complaint that carries with it a high risk of complications and morbidity.  The differential diagnosis includes fracture, strain/pain, dislocation, ligamentous/tendinous injury, neurovascular, mice, ischemic limb, septic arthritis, other   Co morbidities that complicate the patient evaluation  See HPI   Additional history obtained:  Additional history obtained from EMR External records from outside source obtained and reviewed including hospital records   Lab Tests:  I Ordered, and personally interpreted labs.  The pertinent results include: No leukocytosis.  Anemia hemoglobin 11.6.  Placed within range.  Mild decrease in bicarb of 19 otherwise, S within normal limits.  No renal dysfunction.  CBG is 178   Imaging Studies ordered:  I ordered imaging studies including pelvis x-ray with left hip I independently visualized and interpreted imaging which showed left femoral neck fracture I agree with the radiologist interpretation   Cardiac Monitoring: / EKG:  The patient was maintained on a cardiac monitor.  I personally viewed and interpreted the cardiac monitored which showed an underlying rhythm of: Sinus rhythm   Consultations Obtained:  See ED course  Problem List / ED Course / Critical interventions / Medication management  Left hip fracture Reevaluation of the patient  showed that the patient stayed the same I have reviewed the patients home medicines and have made adjustments as needed   Social Determinants of Health:  Former cigarette use.  Denies illicit drug use.   Test / Admission - Considered:  Left hip fracture Vitals signs significant for hyper blood pressure 140/80. Otherwise within normal range and stable throughout visit. Laboratory/imaging studies significant for: See above 73 year old male presents emergency department with complaints of left hip pain.  Patient is in  rehab facility.  States he recently had Whipple's procedure done in January and is there to help regain weight as well as strength.  States that he was trying to get up using his walker when his legs became weak and he fell directly on his left hip.  Denies trauma to head, LOC, blood thinner use.  Denies  any pain elsewhere.  Has been unable to ambulate ever since the incident.  Denies any chest pain, shortness of breath, abdominal pain, nausea, vomiting.  Presents emergency department for further assessment. On exam, tender to palpation left proximal hip.  No pulse deficits to suggest ischemic limb.  No overlying skin changes concerning for secondary infectious process.  No other appreciable reproducible tenderness or traumatic injury on exam.  X-ray obtained which was concerning for left more neck fracture.  Consulted orthopedics who recommended n.p.o. after midnight with planned surgery tomorrow.  Consulted hospitalist who agreed with admission. Treatment plan were discussed at length with patient and they knowledge understanding was agreeable to said plan.  Appropriate consultations were made as described in the ED course.  Patient was stable upon admission to the hospital.   {Document critical care time when appropriate:1} {Document review of labs and clinical decision tools ie heart score, Chads2Vasc2 etc:1}  {Document your independent review of radiology images, and any outside records:1} {Document your discussion with family members, caretakers, and with consultants:1} {Document social determinants of health affecting pt's care:1} {Document your decision making why or why not admission, treatments were needed:1} Final Clinical Impression(s) / ED Diagnoses Final diagnoses:  None    Rx / DC Orders ED Discharge Orders     None

## 2023-07-12 NOTE — ED Triage Notes (Signed)
 Pt says that he was having a good work out and using a medicine ball when he fell, denies dizziness prior to fall. Pt  stops mid sentence during triage questions, he is easily awakened, says that he is tired because he could not rest today because of his pain. He reports taking 3 tablets of a medication for pain today, unable to discern which medication of  the same. Oriented to person and place, hx of diabetes. He c/o left thigh pain and left hip pain.

## 2023-07-12 NOTE — ED Notes (Signed)
 Pt transported to xray

## 2023-07-13 ENCOUNTER — Other Ambulatory Visit: Payer: Self-pay

## 2023-07-13 ENCOUNTER — Encounter (HOSPITAL_COMMUNITY): Payer: Self-pay | Admitting: Internal Medicine

## 2023-07-13 ENCOUNTER — Inpatient Hospital Stay (HOSPITAL_COMMUNITY): Admitting: Certified Registered Nurse Anesthetist

## 2023-07-13 ENCOUNTER — Encounter (HOSPITAL_COMMUNITY)
Admission: EM | Disposition: A | Discharge status: Home Health | Payer: Self-pay | Source: Home / Self Care | Attending: Internal Medicine

## 2023-07-13 ENCOUNTER — Inpatient Hospital Stay (HOSPITAL_COMMUNITY)

## 2023-07-13 ENCOUNTER — Inpatient Hospital Stay (HOSPITAL_COMMUNITY): Admit: 2023-07-13 | Admitting: Student

## 2023-07-13 ENCOUNTER — Telehealth: Payer: Self-pay | Admitting: Family Medicine

## 2023-07-13 DIAGNOSIS — Z833 Family history of diabetes mellitus: Secondary | ICD-10-CM | POA: Diagnosis not present

## 2023-07-13 DIAGNOSIS — E1165 Type 2 diabetes mellitus with hyperglycemia: Secondary | ICD-10-CM | POA: Diagnosis present

## 2023-07-13 DIAGNOSIS — Z8051 Family history of malignant neoplasm of kidney: Secondary | ICD-10-CM | POA: Diagnosis not present

## 2023-07-13 DIAGNOSIS — E1142 Type 2 diabetes mellitus with diabetic polyneuropathy: Secondary | ICD-10-CM | POA: Diagnosis present

## 2023-07-13 DIAGNOSIS — F32A Depression, unspecified: Secondary | ICD-10-CM | POA: Diagnosis present

## 2023-07-13 DIAGNOSIS — E785 Hyperlipidemia, unspecified: Secondary | ICD-10-CM | POA: Diagnosis present

## 2023-07-13 DIAGNOSIS — F418 Other specified anxiety disorders: Secondary | ICD-10-CM | POA: Diagnosis not present

## 2023-07-13 DIAGNOSIS — E43 Unspecified severe protein-calorie malnutrition: Secondary | ICD-10-CM | POA: Diagnosis present

## 2023-07-13 DIAGNOSIS — Z794 Long term (current) use of insulin: Secondary | ICD-10-CM | POA: Diagnosis not present

## 2023-07-13 DIAGNOSIS — Z818 Family history of other mental and behavioral disorders: Secondary | ICD-10-CM | POA: Diagnosis not present

## 2023-07-13 DIAGNOSIS — S72002A Fracture of unspecified part of neck of left femur, initial encounter for closed fracture: Secondary | ICD-10-CM

## 2023-07-13 DIAGNOSIS — Z85828 Personal history of other malignant neoplasm of skin: Secondary | ICD-10-CM | POA: Diagnosis not present

## 2023-07-13 DIAGNOSIS — F419 Anxiety disorder, unspecified: Secondary | ICD-10-CM | POA: Diagnosis present

## 2023-07-13 DIAGNOSIS — E119 Type 2 diabetes mellitus without complications: Secondary | ICD-10-CM

## 2023-07-13 DIAGNOSIS — Z801 Family history of malignant neoplasm of trachea, bronchus and lung: Secondary | ICD-10-CM | POA: Diagnosis not present

## 2023-07-13 DIAGNOSIS — E872 Acidosis, unspecified: Secondary | ICD-10-CM | POA: Diagnosis present

## 2023-07-13 DIAGNOSIS — W1830XA Fall on same level, unspecified, initial encounter: Secondary | ICD-10-CM | POA: Diagnosis present

## 2023-07-13 DIAGNOSIS — D490 Neoplasm of unspecified behavior of digestive system: Secondary | ICD-10-CM | POA: Diagnosis present

## 2023-07-13 DIAGNOSIS — M80052A Age-related osteoporosis with current pathological fracture, left femur, initial encounter for fracture: Secondary | ICD-10-CM | POA: Diagnosis present

## 2023-07-13 DIAGNOSIS — I10 Essential (primary) hypertension: Secondary | ICD-10-CM

## 2023-07-13 DIAGNOSIS — Z8249 Family history of ischemic heart disease and other diseases of the circulatory system: Secondary | ICD-10-CM | POA: Diagnosis not present

## 2023-07-13 DIAGNOSIS — Z6820 Body mass index (BMI) 20.0-20.9, adult: Secondary | ICD-10-CM | POA: Diagnosis not present

## 2023-07-13 DIAGNOSIS — Z83438 Family history of other disorder of lipoprotein metabolism and other lipidemia: Secondary | ICD-10-CM | POA: Diagnosis not present

## 2023-07-13 DIAGNOSIS — Z87891 Personal history of nicotine dependence: Secondary | ICD-10-CM | POA: Diagnosis not present

## 2023-07-13 DIAGNOSIS — K5900 Constipation, unspecified: Secondary | ICD-10-CM | POA: Diagnosis present

## 2023-07-13 DIAGNOSIS — D62 Acute posthemorrhagic anemia: Secondary | ICD-10-CM | POA: Diagnosis not present

## 2023-07-13 DIAGNOSIS — N3010 Interstitial cystitis (chronic) without hematuria: Secondary | ICD-10-CM | POA: Diagnosis present

## 2023-07-13 HISTORY — PX: TOTAL HIP ARTHROPLASTY: SHX124

## 2023-07-13 LAB — MAGNESIUM: Magnesium: 1.7 mg/dL (ref 1.7–2.4)

## 2023-07-13 LAB — BASIC METABOLIC PANEL WITH GFR
Anion gap: 7 (ref 5–15)
BUN: 9 mg/dL (ref 8–23)
CO2: 23 mmol/L (ref 22–32)
Calcium: 9.2 mg/dL (ref 8.9–10.3)
Chloride: 106 mmol/L (ref 98–111)
Creatinine, Ser: 0.78 mg/dL (ref 0.61–1.24)
GFR, Estimated: >60 mL/min (ref >60)
Glucose, Bld: 144 mg/dL — ABNORMAL HIGH (ref 70–99)
Potassium: 4 mmol/L (ref 3.5–5.1)
Sodium: 136 mmol/L (ref 135–145)

## 2023-07-13 LAB — PHOSPHORUS: Phosphorus: 3 mg/dL (ref 2.5–4.6)

## 2023-07-13 LAB — CBC
HCT: 35.7 % — ABNORMAL LOW (ref 39.0–52.0)
Hemoglobin: 12 g/dL — ABNORMAL LOW (ref 13.0–17.0)
MCH: 29.1 pg (ref 26.0–34.0)
MCHC: 33.6 g/dL (ref 30.0–36.0)
MCV: 86.4 fL (ref 80.0–100.0)
Platelets: 196 10*3/uL (ref 150–400)
RBC: 4.13 MIL/uL — ABNORMAL LOW (ref 4.22–5.81)
RDW: 14.2 % (ref 11.5–15.5)
WBC: 8.4 10*3/uL (ref 4.0–10.5)
nRBC: 0 % (ref 0.0–0.2)

## 2023-07-13 LAB — GLUCOSE, CAPILLARY
Glucose-Capillary: 155 mg/dL — ABNORMAL HIGH (ref 70–99)
Glucose-Capillary: 118 mg/dL — ABNORMAL HIGH (ref 70–99)
Glucose-Capillary: 113 mg/dL — ABNORMAL HIGH (ref 70–99)
Glucose-Capillary: 175 mg/dL — ABNORMAL HIGH (ref 70–99)
Glucose-Capillary: 180 mg/dL — ABNORMAL HIGH (ref 70–99)
Glucose-Capillary: 232 mg/dL — ABNORMAL HIGH (ref 70–99)

## 2023-07-13 LAB — VITAMIN D 25 HYDROXY (VIT D DEFICIENCY, FRACTURES): Vit D, 25-Hydroxy: 34.65 ng/mL (ref 30–100)

## 2023-07-13 LAB — PROTIME-INR
INR: 1.1 (ref 0.8–1.2)
Prothrombin Time: 14.7 s (ref 11.4–15.2)

## 2023-07-13 LAB — SURGICAL PCR SCREEN
MRSA, PCR: NEGATIVE
Staphylococcus aureus: POSITIVE — AB

## 2023-07-13 SURGERY — ARTHROPLASTY, HIP, TOTAL, ANTERIOR APPROACH
Anesthesia: General | Laterality: Left

## 2023-07-13 SURGERY — HEMIARTHROPLASTY (BIPOLAR) HIP, POSTERIOR APPROACH FOR FRACTURE
Anesthesia: General | Laterality: Left

## 2023-07-13 MED ORDER — CEFAZOLIN SODIUM-DEXTROSE 2-4 GM/100ML-% IV SOLN
INTRAVENOUS | Status: AC
  Filled 2023-07-13: qty 100

## 2023-07-13 MED ORDER — HYDRALAZINE HCL 10 MG PO TABS: 10.0000 mg | ORAL_TABLET | Freq: Four times a day (QID) | ORAL | Status: DC | PRN

## 2023-07-13 MED ORDER — PHENYLEPHRINE 80 MCG/ML (10ML) SYRINGE FOR IV PUSH (FOR BLOOD PRESSURE SUPPORT)
PREFILLED_SYRINGE | INTRAVENOUS | Status: AC
  Filled 2023-07-13: qty 20

## 2023-07-13 MED ORDER — MIDAZOLAM HCL 2 MG/2ML IJ SOLN: 0.5000 mg | Freq: Once | INTRAMUSCULAR | Status: DC | PRN

## 2023-07-13 MED ORDER — TRANEXAMIC ACID-NACL 1000-0.7 MG/100ML-% IV SOLN
INTRAVENOUS | Status: AC
Start: 2023-07-13 — End: 2023-07-13
  Filled 2023-07-13: qty 100

## 2023-07-13 MED ORDER — INSULIN ASPART 100 UNIT/ML IJ SOLN
0.0000 [IU] | Freq: Every day | INTRAMUSCULAR | Status: DC
  Administered 2023-07-13 – 2023-07-14 (×2): 2 [IU] via SUBCUTANEOUS
  Administered 2023-07-15: 3 [IU] via SUBCUTANEOUS
  Administered 2023-07-16: 4 [IU] via SUBCUTANEOUS
  Administered 2023-07-17 – 2023-07-18 (×2): 3 [IU] via SUBCUTANEOUS

## 2023-07-13 MED ORDER — HYDROMORPHONE HCL 1 MG/ML IJ SOLN: 0.5000 mg | INTRAMUSCULAR | Status: DC | PRN

## 2023-07-13 MED ORDER — LACTATED RINGERS IV SOLN: INTRAVENOUS | Status: AC

## 2023-07-13 MED ORDER — DOCUSATE SODIUM 100 MG PO CAPS
100.0000 mg | ORAL_CAPSULE | Freq: Two times a day (BID) | ORAL | Status: DC
  Administered 2023-07-13 – 2023-07-18 (×9): 100 mg via ORAL
  Filled 2023-07-13 (×10): qty 1

## 2023-07-13 MED ORDER — 0.9 % SODIUM CHLORIDE (POUR BTL) OPTIME
TOPICAL | Status: DC | PRN
  Administered 2023-07-13: 1000 mL

## 2023-07-13 MED ORDER — MUPIROCIN 2 % EX OINT: 1.0000 | TOPICAL_OINTMENT | Freq: Two times a day (BID) | CUTANEOUS | 0 refills | Status: AC

## 2023-07-13 MED ORDER — HYDROMORPHONE HCL 1 MG/ML IJ SOLN
1.0000 mg | INTRAMUSCULAR | Status: AC
  Administered 2023-07-13: 1 mg via INTRAVENOUS
  Filled 2023-07-13: qty 1

## 2023-07-13 MED ORDER — PROCHLORPERAZINE EDISYLATE 10 MG/2ML IJ SOLN: 5.0000 mg | Freq: Four times a day (QID) | INTRAMUSCULAR | Status: DC | PRN

## 2023-07-13 MED ORDER — ROCURONIUM BROMIDE 10 MG/ML (PF) SYRINGE
PREFILLED_SYRINGE | INTRAVENOUS | Status: DC | PRN
  Administered 2023-07-13: 60 mg via INTRAVENOUS
  Administered 2023-07-13 (×2): 10 mg via INTRAVENOUS
  Administered 2023-07-13: 20 mg via INTRAVENOUS

## 2023-07-13 MED ORDER — INSULIN ASPART 100 UNIT/ML IJ SOLN
0.0000 [IU] | Freq: Three times a day (TID) | INTRAMUSCULAR | Status: DC
  Administered 2023-07-13: 2 [IU] via SUBCUTANEOUS
  Administered 2023-07-14: 3 [IU] via SUBCUTANEOUS
  Administered 2023-07-14: 5 [IU] via SUBCUTANEOUS
  Administered 2023-07-14: 2 [IU] via SUBCUTANEOUS
  Administered 2023-07-15: 5 [IU] via SUBCUTANEOUS
  Administered 2023-07-15: 2 [IU] via SUBCUTANEOUS
  Administered 2023-07-15 – 2023-07-16 (×2): 7 [IU] via SUBCUTANEOUS
  Administered 2023-07-16 (×2): 3 [IU] via SUBCUTANEOUS
  Administered 2023-07-17: 2 [IU] via SUBCUTANEOUS
  Administered 2023-07-17: 5 [IU] via SUBCUTANEOUS
  Administered 2023-07-17: 7 [IU] via SUBCUTANEOUS
  Administered 2023-07-18: 3 [IU] via SUBCUTANEOUS
  Administered 2023-07-18: 9 [IU] via SUBCUTANEOUS
  Administered 2023-07-18 – 2023-07-19 (×2): 5 [IU] via SUBCUTANEOUS

## 2023-07-13 MED ORDER — ROSUVASTATIN CALCIUM 20 MG PO TABS
20.0000 mg | ORAL_TABLET | Freq: Every day | ORAL | Status: DC
  Administered 2023-07-14 – 2023-07-19 (×6): 20 mg via ORAL
  Filled 2023-07-13 (×6): qty 1

## 2023-07-13 MED ORDER — PHENYLEPHRINE HCL-NACL 20-0.9 MG/250ML-% IV SOLN
INTRAVENOUS | Status: DC | PRN
  Administered 2023-07-13: 10 ug/min via INTRAVENOUS

## 2023-07-13 MED ORDER — PHENYLEPHRINE 80 MCG/ML (10ML) SYRINGE FOR IV PUSH (FOR BLOOD PRESSURE SUPPORT)
PREFILLED_SYRINGE | INTRAVENOUS | Status: DC | PRN
  Administered 2023-07-13 (×6): 80 ug via INTRAVENOUS

## 2023-07-13 MED ORDER — PROPOFOL 10 MG/ML IV BOLUS
INTRAVENOUS | Status: AC
  Filled 2023-07-13: qty 20

## 2023-07-13 MED ORDER — DEXAMETHASONE SODIUM PHOSPHATE 10 MG/ML IJ SOLN
INTRAMUSCULAR | Status: AC
  Filled 2023-07-13: qty 2

## 2023-07-13 MED ORDER — SUGAMMADEX SODIUM 200 MG/2ML IV SOLN
INTRAVENOUS | Status: DC | PRN
  Administered 2023-07-13: 140 mg via INTRAVENOUS

## 2023-07-13 MED ORDER — ROCURONIUM BROMIDE 10 MG/ML (PF) SYRINGE
PREFILLED_SYRINGE | INTRAVENOUS | Status: AC
  Filled 2023-07-13: qty 30

## 2023-07-13 MED ORDER — METHOCARBAMOL 1000 MG/10ML IJ SOLN: 500.0000 mg | Freq: Four times a day (QID) | INTRAMUSCULAR | Status: DC | PRN

## 2023-07-13 MED ORDER — MEPERIDINE HCL 25 MG/ML IJ SOLN: 6.2500 mg | INTRAMUSCULAR | Status: DC | PRN

## 2023-07-13 MED ORDER — POLYETHYLENE GLYCOL 3350 17 G PO PACK: 17.0000 g | PACK | Freq: Every day | ORAL | Status: DC | PRN

## 2023-07-13 MED ORDER — OXYCODONE HCL 5 MG PO TABS: 5.0000 mg | ORAL_TABLET | Freq: Once | ORAL | Status: DC | PRN

## 2023-07-13 MED ORDER — VANCOMYCIN HCL IN DEXTROSE 1-5 GM/200ML-% IV SOLN: 1000.0000 mg | INTRAVENOUS | Status: AC

## 2023-07-13 MED ORDER — MELATONIN 5 MG PO TABS: 5.0000 mg | ORAL_TABLET | Freq: Every evening | ORAL | Status: DC | PRN

## 2023-07-13 MED ORDER — LIDOCAINE 2% (20 MG/ML) 5 ML SYRINGE
INTRAMUSCULAR | Status: DC | PRN
Start: 2023-07-13 — End: 2023-07-13
  Administered 2023-07-13: 20 mg via INTRAVENOUS

## 2023-07-13 MED ORDER — CHLORHEXIDINE GLUCONATE 0.12 % MT SOLN
15.0000 mL | Freq: Once | OROMUCOSAL | Status: AC
  Administered 2023-07-13: 15 mL via OROMUCOSAL

## 2023-07-13 MED ORDER — TRANEXAMIC ACID-NACL 1000-0.7 MG/100ML-% IV SOLN
1000.0000 mg | Freq: Once | INTRAVENOUS | Status: AC
  Administered 2023-07-13: 1000 mg via INTRAVENOUS
  Filled 2023-07-13: qty 100

## 2023-07-13 MED ORDER — CEFAZOLIN SODIUM-DEXTROSE 2-4 GM/100ML-% IV SOLN
2.0000 g | INTRAVENOUS | Status: AC
  Administered 2023-07-13: 2 g via INTRAVENOUS

## 2023-07-13 MED ORDER — LACTATED RINGERS IV SOLN: INTRAVENOUS | Status: DC

## 2023-07-13 MED ORDER — PROPOFOL 10 MG/ML IV BOLUS
INTRAVENOUS | Status: DC | PRN
  Administered 2023-07-13: 100 mg via INTRAVENOUS
  Administered 2023-07-13: 30 mg via INTRAVENOUS

## 2023-07-13 MED ORDER — HYDROMORPHONE HCL 1 MG/ML IJ SOLN
INTRAMUSCULAR | Status: AC
  Administered 2023-07-13: 0.5 mg
  Filled 2023-07-13: qty 0.5

## 2023-07-13 MED ORDER — CHLORHEXIDINE GLUCONATE 4 % EX SOLN: 1.0000 | CUTANEOUS | 1 refills | Status: DC

## 2023-07-13 MED ORDER — MELATONIN 5 MG PO TABS
ORAL_TABLET | ORAL | Status: AC
  Filled 2023-07-13: qty 1

## 2023-07-13 MED ORDER — SODIUM CHLORIDE 0.9 % IR SOLN
Status: DC | PRN
  Administered 2023-07-13: 3000 mL

## 2023-07-13 MED ORDER — HYDROMORPHONE HCL 1 MG/ML IJ SOLN
INTRAMUSCULAR | Status: AC
  Filled 2023-07-13: qty 1

## 2023-07-13 MED ORDER — PHENYLEPHRINE HCL-NACL 20-0.9 MG/250ML-% IV SOLN: INTRAVENOUS | Status: DC | PRN

## 2023-07-13 MED ORDER — FENTANYL CITRATE (PF) 250 MCG/5ML IJ SOLN
INTRAMUSCULAR | Status: AC
  Filled 2023-07-13: qty 5

## 2023-07-13 MED ORDER — LIDOCAINE 2% (20 MG/ML) 5 ML SYRINGE
INTRAMUSCULAR | Status: AC
  Filled 2023-07-13: qty 10

## 2023-07-13 MED ORDER — DIPHENHYDRAMINE HCL 12.5 MG/5ML PO ELIX: 12.5000 mg | ORAL_SOLUTION | ORAL | Status: DC | PRN

## 2023-07-13 MED ORDER — VANCOMYCIN HCL 1000 MG IV SOLR
INTRAVENOUS | Status: DC | PRN
Start: 2023-07-13 — End: 2023-07-13
  Administered 2023-07-13: 1000 mg

## 2023-07-13 MED ORDER — TRANEXAMIC ACID-NACL 1000-0.7 MG/100ML-% IV SOLN
1000.0000 mg | INTRAVENOUS | Status: AC
  Administered 2023-07-13: 1000 mg via INTRAVENOUS

## 2023-07-13 MED ORDER — VANCOMYCIN HCL IN DEXTROSE 1-5 GM/200ML-% IV SOLN
INTRAVENOUS | Status: AC
  Administered 2023-07-13: 1000 mg via INTRAVENOUS
  Filled 2023-07-13: qty 200

## 2023-07-13 MED ORDER — ACETAMINOPHEN 500 MG PO TABS
ORAL_TABLET | ORAL | Status: AC
  Filled 2023-07-13: qty 2

## 2023-07-13 MED ORDER — INSULIN ASPART 100 UNIT/ML IJ SOLN: 0.0000 [IU] | INTRAMUSCULAR | Status: DC | PRN

## 2023-07-13 MED ORDER — ACETAMINOPHEN 325 MG PO TABS
650.0000 mg | ORAL_TABLET | Freq: Four times a day (QID) | ORAL | Status: DC | PRN
  Administered 2023-07-14: 650 mg via ORAL
  Filled 2023-07-13: qty 2

## 2023-07-13 MED ORDER — CEFAZOLIN SODIUM-DEXTROSE 2-4 GM/100ML-% IV SOLN
2.0000 g | Freq: Three times a day (TID) | INTRAVENOUS | Status: AC
  Administered 2023-07-13 – 2023-07-14 (×3): 2 g via INTRAVENOUS
  Filled 2023-07-13 (×3): qty 100

## 2023-07-13 MED ORDER — HYDROMORPHONE HCL 1 MG/ML IJ SOLN
0.2500 mg | INTRAMUSCULAR | Status: DC | PRN
  Administered 2023-07-13: 0.5 mg via INTRAVENOUS
  Administered 2023-07-13: 0.25 mg via INTRAVENOUS

## 2023-07-13 MED ORDER — OXYCODONE HCL 5 MG PO TABS
5.0000 mg | ORAL_TABLET | Freq: Four times a day (QID) | ORAL | Status: DC | PRN
  Administered 2023-07-14: 5 mg via ORAL
  Filled 2023-07-13: qty 1

## 2023-07-13 MED ORDER — ONDANSETRON HCL 4 MG/2ML IJ SOLN
INTRAMUSCULAR | Status: AC
  Filled 2023-07-13: qty 4

## 2023-07-13 MED ORDER — ORAL CARE MOUTH RINSE: 15.0000 mL | Freq: Once | OROMUCOSAL | Status: AC

## 2023-07-13 MED ORDER — POVIDONE-IODINE 10 % EX SWAB
2.0000 | Freq: Once | CUTANEOUS | Status: DC
Start: 2023-07-13 — End: 2023-07-13

## 2023-07-13 MED ORDER — VANCOMYCIN HCL 1000 MG IV SOLR
INTRAVENOUS | Status: AC
  Filled 2023-07-13: qty 20

## 2023-07-13 MED ORDER — ONDANSETRON HCL 4 MG/2ML IJ SOLN
4.0000 mg | INTRAMUSCULAR | Status: AC
  Administered 2023-07-13: 4 mg via INTRAVENOUS
  Filled 2023-07-13: qty 2

## 2023-07-13 MED ORDER — ASPIRIN 325 MG PO TABS
325.0000 mg | ORAL_TABLET | Freq: Every day | ORAL | Status: DC
  Administered 2023-07-14 – 2023-07-19 (×6): 325 mg via ORAL
  Filled 2023-07-13 (×6): qty 1

## 2023-07-13 MED ORDER — CHLORHEXIDINE GLUCONATE 4 % EX SOLN: 60.0000 mL | Freq: Once | CUTANEOUS | Status: DC

## 2023-07-13 MED ORDER — STERILE WATER FOR IRRIGATION IR SOLN
Status: DC | PRN
  Administered 2023-07-13: 1000 mL

## 2023-07-13 MED ORDER — DEXAMETHASONE SODIUM PHOSPHATE 10 MG/ML IJ SOLN
INTRAMUSCULAR | Status: DC | PRN
  Administered 2023-07-13: 4 mg via INTRAVENOUS

## 2023-07-13 MED ORDER — OXYCODONE HCL 5 MG/5ML PO SOLN: 5.0000 mg | Freq: Once | ORAL | Status: DC | PRN

## 2023-07-13 MED ORDER — ONDANSETRON HCL 4 MG/2ML IJ SOLN
INTRAMUSCULAR | Status: DC | PRN
  Administered 2023-07-13: 4 mg via INTRAVENOUS

## 2023-07-13 MED ORDER — FENTANYL CITRATE (PF) 250 MCG/5ML IJ SOLN
INTRAMUSCULAR | Status: DC | PRN
  Administered 2023-07-13: 25 ug via INTRAVENOUS
  Administered 2023-07-13: 50 ug via INTRAVENOUS
  Administered 2023-07-13: 25 ug via INTRAVENOUS
  Administered 2023-07-13: 50 ug via INTRAVENOUS

## 2023-07-13 MED ORDER — METHOCARBAMOL 500 MG PO TABS
500.0000 mg | ORAL_TABLET | Freq: Four times a day (QID) | ORAL | Status: DC | PRN
  Administered 2023-07-14: 500 mg via ORAL
  Filled 2023-07-13: qty 1

## 2023-07-13 MED ORDER — ALBUMIN HUMAN 5 % IV SOLN: INTRAVENOUS | Status: DC | PRN

## 2023-07-13 SURGICAL SUPPLY — 42 items
BAG COUNTER SPONGE SURGICOUNT (BAG) ×1 IMPLANT
BIT DRILL 7/64X5 DISP (BIT) ×1 IMPLANT
BIT DRILL RINGLOC 3.2MMX20 (BIT) IMPLANT
BIT DRILL RINGLOC 3.2X20 (BIT) ×1 IMPLANT
BLADE SAW SGTL 18X1.27X75 (BLADE) ×1 IMPLANT
BNDG COHESIVE 6X5 TAN ST LF (GAUZE/BANDAGES/DRESSINGS) ×1 IMPLANT
CHLORAPREP W/TINT 26 (MISCELLANEOUS) ×1 IMPLANT
COVER PERINEAL POST (MISCELLANEOUS) ×1 IMPLANT
COVER SURGICAL LIGHT HANDLE (MISCELLANEOUS) ×1 IMPLANT
DERMABOND ADVANCED .7 DNX12 (GAUZE/BANDAGES/DRESSINGS) IMPLANT
DRAPE HALF SHEET 40X57 (DRAPES) ×2 IMPLANT
DRAPE IMP U-DRAPE 54X76 (DRAPES) ×2 IMPLANT
DRAPE INCISE IOBAN 85X60 (DRAPES) ×1 IMPLANT
DRAPE STERI IOBAN 125X83 (DRAPES) ×1 IMPLANT
DRAPE SURG 17X23 STRL (DRAPES) ×1 IMPLANT
DRAPE SURG ORHT 6 SPLT 77X108 (DRAPES) IMPLANT
DRAPE U-SHAPE 47X51 STRL (DRAPES) IMPLANT
DRSG AQUACEL AG ADV 3.5X10 (GAUZE/BANDAGES/DRESSINGS) IMPLANT
DRSG MEPILEX POST OP 4X8 (GAUZE/BANDAGES/DRESSINGS) ×1 IMPLANT
ELECT BLADE 6.5 EXT (BLADE) IMPLANT
ELECT CAUTERY BLADE 6.4 (BLADE) IMPLANT
ELECT REM PT RETURN 9FT ADLT (ELECTROSURGICAL) ×1 IMPLANT
ELECTRODE REM PT RTRN 9FT ADLT (ELECTROSURGICAL) ×1 IMPLANT
GLOVE BIO SURGEON STRL SZ 6.5 (GLOVE) ×3 IMPLANT
GLOVE BIO SURGEON STRL SZ7.5 (GLOVE) ×3 IMPLANT
GLOVE BIOGEL PI IND STRL 6.5 (GLOVE) ×1 IMPLANT
GLOVE BIOGEL PI IND STRL 7.5 (GLOVE) ×1 IMPLANT
GOWN STRL REUS W/ TWL LRG LVL3 (GOWN DISPOSABLE) ×2 IMPLANT
HEAD CERAMIC BIOLOX 36 T1 STD (Head) IMPLANT
KIT BASIN OR (CUSTOM PROCEDURE TRAY) ×1 IMPLANT
KIT TURNOVER KIT B (KITS) ×1 IMPLANT
LINER ACETAB NN G7 F 36 (Liner) IMPLANT
MANIFOLD NEPTUNE II (INSTRUMENTS) ×1 IMPLANT
NS IRRIG 1000ML POUR BTL (IV SOLUTION) ×1 IMPLANT
PACK TOTAL JOINT (CUSTOM PROCEDURE TRAY) ×1 IMPLANT
SCREW BN 30X6.5XST STRL ACTB (Screw) IMPLANT
SHELL ACET G7 4H 56 SZF (Shell) IMPLANT
STEM FEM CMTLS STD 14X146 (Stem) IMPLANT
SUT VIC AB 1 CT1 27XBRD ANBCTR (SUTURE) ×1 IMPLANT
TOWEL GREEN STERILE (TOWEL DISPOSABLE) ×1 IMPLANT
TUBE SUCTION HIGH CAP CLEAR NV (SUCTIONS) IMPLANT
WATER STERILE IRR 1000ML POUR (IV SOLUTION) ×1 IMPLANT

## 2023-07-13 NOTE — Anesthesia Procedure Notes (Signed)
 Procedure Name: Intubation Date/Time: 07/13/2023 11:33 AM  Performed by: Jairo Ben, MDPre-anesthesia Checklist: Patient identified, Emergency Drugs available, Suction available and Patient being monitored Patient Re-evaluated:Patient Re-evaluated prior to induction Oxygen Delivery Method: Circle system utilized Preoxygenation: Pre-oxygenation with 100% oxygen Induction Type: IV induction Ventilation: Mask ventilation without difficulty Laryngoscope Size: Glidescope and 4 Grade View: Grade I Tube type: Oral Tube size: 7.5 mm Number of attempts: 1 Airway Equipment and Method: Stylet and Oral airway Placement Confirmation: positive ETCO2, breath sounds checked- equal and bilateral and ETT inserted through vocal cords under direct vision Secured at: 24 cm Tube secured with: Tape Dental Injury: Teeth and Oropharynx as per pre-operative assessment  Difficulty Due To: Difficulty was unanticipated Comments: DL x1 with no view, unable to lift epiglottis by CRNA. DLx1 by Dr. Jean Rosenthal with Mac 4 blade, unable to pass bougie successfully. Decision made to switch to glidescope for successful intubation due to anterior larnyx

## 2023-07-13 NOTE — H&P (View-Only) (Signed)
 Reason for Consult:Left hip fx Referring Physician: [name] Time called: 1610 Time at bedside: 0859   Andrew Tanner is an 73 y.o. male.  HPI: Andrew Tanner was at a group home trying to build up strength following a Whipple done 04/27/23. He got up onto his RW but had left sided weakness and fell to the floor. He had immediate left hip pain and could not get up. He was brought to the ED where x-rays showed a hip fx and orthopedic surgery was consulted. Prior to the Whipple he was doing about 1 hour of exercise per day without any assistive devices. He lives at home with his wife.  Past Medical History:  Diagnosis Date   Anxiety    Atrial myxoma    Basal cell carcinoma    Cancer (HCC)    basal cell skin CA   Cataract    Depression    Diabetes mellitus without complication (HCC)    Diabetic neuropathy (HCC)    Duodenal adenoma    Hyperlipidemia    Hypertension    IC (interstitial cystitis)    Neuromuscular disorder (HCC) 01/09/21   Peripheral  Neuropathy   Pancreatic lesion    Squamous cell carcinoma of skin     Past Surgical History:  Procedure Laterality Date   BIOPSY  12/05/2022   Procedure: BIOPSY;  Surgeon: [name], MD;  Location: [facility];  Service: Gastroenterology;;   CATARACT EXTRACTION, BILATERAL     ESOPHAGOGASTRODUODENOSCOPY N/A 12/05/2022   Procedure: ESOPHAGOGASTRODUODENOSCOPY (EGD);  Surgeon: [name], MD;  Location: [facility];  Service: Gastroenterology;  Laterality: N/A;   EUS N/A 12/05/2022   Procedure: UPPER ENDOSCOPIC ULTRASOUND (EUS) RADIAL;  Surgeon: [name], MD;  Location: [facility];  Service: Gastroenterology;  Laterality: N/A;   FINE NEEDLE ASPIRATION N/A 12/05/2022   Procedure: FINE NEEDLE ASPIRATION (FNA) LINEAR;  Surgeon: [name], MD;  Location: [facility];  Service: Gastroenterology;  Laterality: N/A;   HERNIA REPAIR N/A    inguinal   NECK SURGERY     ant/post   SPINE  SURGERY     SUBMUCOSAL TATTOO INJECTION  12/05/2022   Procedure: SUBMUCOSAL TATTOO INJECTION;  Surgeon: [name], MD;  Location: [facility];  Service: Gastroenterology;;   TRANSURETHRAL RESECTION OF PROSTATE     TURP VAPORIZATION     WHIPPLE PROCEDURE N/A 04/27/2023   Procedure: WHIPPLE PROCEDURE;  Surgeon: [name], MD;  Location: [facility];  Service: General;  Laterality: N/A;    Family History  Problem Relation Age of Onset   Lung cancer Mother    Heart disease Father    Hyperlipidemia Father    Diabetes Father        type 1   Diabetes Brother    Depression Brother    Renal cancer Brother    Anxiety disorder Brother    Stomach cancer Neg Hx    Colon cancer Neg Hx    Pancreatic cancer Neg Hx    Esophageal cancer Neg Hx    Rectal cancer Neg Hx     Social History:  reports that he quit smoking about 41 years ago. His smoking use included cigarettes. He started smoking about 46 years ago. He has a 5 pack-year smoking history. He has never used smokeless tobacco. He reports that he does not currently use alcohol. He reports that he does not use drugs.  Allergies: No Known Allergies  Medications: I have reviewed the patient's current medications.  Results for orders placed  or performed during the hospital encounter of 07/12/23 (from the past 48 hours)  CBG monitoring, ED     Status: Abnormal   Collection Time: 07/12/23  9:07 PM  Result Value Ref Range   Glucose-Capillary 178 (H) 70 - 99 mg/dL    Comment: Glucose reference range applies only to samples taken after fasting for at least 8 hours.  Basic metabolic panel     Status: Abnormal   Collection Time: 07/12/23  9:58 PM  Result Value Ref Range   Sodium 135 135 - 145 mmol/L   Potassium 4.0 3.5 - 5.1 mmol/L   Chloride 109 98 - 111 mmol/L   CO2 19 (L) 22 - 32 mmol/L   Glucose, Bld 214 (H) 70 - 99 mg/dL    Comment: Glucose reference range applies only to samples taken after fasting for at least 8 hours.    BUN 12 8 - 23 mg/dL   Creatinine, Ser 0.98 0.61 - 1.24 mg/dL   Calcium 9.1 8.9 - 11.9 mg/dL   GFR, Estimated >14 >78 mL/min    Comment: (NOTE) Calculated using the CKD-EPI Creatinine Equation (2021)    Anion gap 7 5 - 15    Comment: Performed at [facility], [address] [place], [place] [address]  CBC     Status: Abnormal   Collection Time: 07/12/23  9:58 PM  Result Value Ref Range   WBC 9.7 4.0 - 10.5 K/uL   RBC 4.05 (L) 4.22 - 5.81 MIL/uL   Hemoglobin 11.6 (L) 13.0 - 17.0 g/dL   HCT 13.0 (L) 86.5 - 78.4 %   MCV 86.4 80.0 - 100.0 fL   MCH 28.6 26.0 - 34.0 pg   MCHC 33.1 30.0 - 36.0 g/dL   RDW 69.6 29.5 - 28.4 %   Platelets 227 150 - 400 K/uL   nRBC 0.0 0.0 - 0.2 %    Comment: Performed at [facility], [address], [place], [place] [address]  Glucose, capillary     Status: Abnormal   Collection Time: 07/13/23  2:54 AM  Result Value Ref Range   Glucose-Capillary 155 (H) 70 - 99 mg/dL    Comment: Glucose reference range applies only to samples taken after fasting for at least 8 hours.  Type and screen [facility]     Status: None   Collection Time: 07/13/23  6:19 AM  Result Value Ref Range   ABO/RH(D) A POS    Antibody Screen NEG    Sample Expiration      07/16/2023,2359 Performed at [facility], [address], [place], [place] [address]   CBC     Status: Abnormal   Collection Time: 07/13/23  6:25 AM  Result Value Ref Range   WBC 8.4 4.0 - 10.5 K/uL   RBC 4.13 (L) 4.22 - 5.81 MIL/uL   Hemoglobin 12.0 (L) 13.0 - 17.0 g/dL   HCT 25.3 (L) 66.4 - 40.3 %   MCV 86.4 80.0 - 100.0 fL   MCH 29.1 26.0 - 34.0 pg   MCHC 33.6 30.0 - 36.0 g/dL   RDW 47.4 25.9 - 56.3 %   Platelets 196 150 - 400 K/uL   nRBC 0.0 0.0 - 0.2 %    Comment: Performed at [facility], [address], [place], [place] [address]  Basic metabolic panel     Status: Abnormal   Collection Time: 07/13/23  6:25 AM  Result Value Ref Range  Sodium 136 135 - 145  mmol/L   Potassium 4.0 3.5 - 5.1 mmol/L   Chloride 106 98 - 111 mmol/L   CO2 23 22 - 32 mmol/L   Glucose, Bld 144 (H) 70 - 99 mg/dL    Comment: Glucose reference range applies only to samples taken after fasting for at least 8 hours.   BUN 9 8 - 23 mg/dL   Creatinine, Ser 1.61 0.61 - 1.24 mg/dL   Calcium 9.2 8.9 - 09.6 mg/dL   GFR, Estimated >04 >54 mL/min    Comment: (NOTE) Calculated using the CKD-EPI Creatinine Equation (2021)    Anion gap 7 5 - 15    Comment: Performed at [facility], [address] [place], [place] [address]  Magnesium     Status: None   Collection Time: 07/13/23  6:25 AM  Result Value Ref Range   Magnesium 1.7 1.7 - 2.4 mg/dL    Comment: Performed at [facility], [address] [place], [place] [address]  Phosphorus     Status: None   Collection Time: 07/13/23  6:25 AM  Result Value Ref Range   Phosphorus 3.0 2.5 - 4.6 mg/dL    Comment: Performed at [facility], [address], [place], [place] [address]  Protime-INR     Status: None   Collection Time: 07/13/23  6:25 AM  Result Value Ref Range   Prothrombin Time 14.7 11.4 - 15.2 seconds   INR 1.1 0.8 - 1.2    Comment: (NOTE) INR goal varies based on device and disease states. Performed at [facility], [address] [place], [place] [address]   Glucose, capillary     Status: Abnormal   Collection Time: 07/13/23  7:52 AM  Result Value Ref Range   Glucose-Capillary 118 (H) 70 - 99 mg/dL    Comment: Glucose reference range applies only to samples taken after fasting for at least 8 hours.    CT HIP LEFT WO CONTRAST Result Date: 07/13/2023 CLINICAL DATA:  Left hip fracture. EXAM: CT OF THE LEFT HIP WITHOUT CONTRAST TECHNIQUE: Multidetector CT imaging of the left hip was performed according to the standard protocol. Multiplanar CT image reconstructions were also generated. RADIATION DOSE REDUCTION: This exam was performed according to the departmental dose-optimization program  which includes automated exposure control, adjustment of the mA and/or kV according to patient size and/or use of iterative reconstruction technique. COMPARISON:  AP pelvis and left hip films yesterday, CT abdomen pelvis with contrast 05/30/2023. FINDINGS: Bones/Joint/Cartilage There is mild osteopenia. There is an acute basicervical transverse oblique distal left femoral neck fracture, with comminution and mild impaction at the fracture site, and about 1 cortex width of posterior and lateral displacement of the main distal fragment as well as mild external rotation of the distal fragment. There is no further evidence fractures. There is mild narrowing and spurring at the hip joint, pubic symphysis and left SI joint. Degenerative disc disease and spondylosis L5-S1. Ligaments Suboptimally assessed by CT. Muscles and Tendons There is swelling in the lateral aspect of the left gluteus maximus muscle most likely a due to direct trauma. There is patchy fluid in between muscle groups in the visualized anterior proximal thigh. No space-occupying intramuscular hematoma is seen. There is a grossly normal muscle bulk for age. No acute tendon findings. Soft tissues Partial visualization of enlarged prostate impressing into the bladder base. Sigmoid diverticulosis is noted without evidence of diverticulitis. In the left pelvis no  free fluid, free air or free hemorrhage is seen. There is no space-occupying hematoma in the visualized upper thigh. No mass. IMPRESSION: 1. Acute closed basicervical transverse oblique distal left femoral neck fracture with comminution and mild impaction at the fracture site, and about 1 cortex width of posterior and lateral displacement of the main distal fragment as well as mild external rotation of the distal fragment. 2. Osteopenia and degenerative change. 3. Swelling in the lateral aspect of the left gluteus maximus muscle most likely due to direct trauma. 4. Patchy fluid in between muscle groups  in the visualized anterior proximal thigh. No space-occupying hematoma is seen. 5. Enlarged prostate impressing into the bladder base. 6. Sigmoid diverticulosis. Electronically Signed   By: Almira Bar M.D.   On: 07/13/2023 06:43   DG Hip Unilat With Pelvis 2-3 Views Left Result Date: 07/12/2023 CLINICAL DATA:  Fall, left hip pain EXAM: DG HIP (WITH OR WITHOUT PELVIS) 2-3V LEFT COMPARISON:  None Available. FINDINGS: There is a left femoral neck fracture. No significant angulation. No subluxation or dislocation. Hip joints and SI joints symmetric. IMPRESSION: Left femoral neck fracture. Electronically Signed   By: Charlett Nose M.D.   On: 07/12/2023 22:02    Review of Systems  HENT:  Negative for ear discharge, ear pain, hearing loss and tinnitus.   Eyes:  Negative for photophobia and pain.  Respiratory:  Negative for cough and shortness of breath.   Cardiovascular:  Negative for chest pain.  Gastrointestinal:  Negative for abdominal pain, nausea and vomiting.  Genitourinary:  Negative for dysuria, flank pain, frequency and urgency.  Musculoskeletal:  Positive for arthralgias (Left hip). Negative for back pain, myalgias and neck pain.  Neurological:  Negative for dizziness and headaches.  Hematological:  Does not bruise/bleed easily.  Psychiatric/Behavioral:  The patient is not nervous/anxious.    Blood pressure (!) 163/91, pulse 83, temperature 98.2 F (36.8 C), temperature source Oral, resp. rate 17, height 5\' 11"  (1.803 m), weight 68 kg, SpO2 100%. Physical Exam Constitutional:      General: He is not in acute distress.    Appearance: He is well-developed. He is not diaphoretic.  HENT:     Head: Normocephalic and atraumatic.  Eyes:     General: No scleral icterus.       Right eye: No discharge.        Left eye: No discharge.     Conjunctiva/sclera: Conjunctivae normal.  Cardiovascular:     Rate and Rhythm: Normal rate and regular rhythm.  Pulmonary:     Effort: Pulmonary effort  is normal. No respiratory distress.  Musculoskeletal:     Cervical back: Normal range of motion.     Comments: LLE No traumatic wounds, ecchymosis, or rash  Mod TTP hip  No knee or ankle effusion  Knee stable to varus/ valgus and anterior/posterior stress  Sens DPN, SPN, TN intact  Motor EHL, ext, flex, evers 5/5  DP 2+, PT 2+, No significant edema  Skin:    General: Skin is warm and dry.  Neurological:     Mental Status: He is alert.  Psychiatric:        Mood and Affect: Mood normal.        Behavior: Behavior normal.     Assessment/Plan: Left hip fx -- Plan THA today with Dr. Jena Gauss. Please keep NPO. Multiple medical problems including basal cell carcinoma, diabetes mellitus, diabetic neuropathy, neuromuscular disorder, hyperlipidemia, and malignant neuroendocrine tumor of duodenum -- per primary service  Freeman Caldron, PA-C Orthopedic Surgery [phone] 07/13/2023, 9:11 AM

## 2023-07-13 NOTE — Telephone Encounter (Signed)
 [facility] faxed  Home Health Certificate (Order [place] [id number]), to be filled out by provider. Patient requested to send it back via Fax within 5-days. Document is located in providers tray at front office.Please advise at  [phone].

## 2023-07-13 NOTE — Interval H&P Note (Signed)
 History and Physical Interval Note:  07/13/2023 10:25 AM  Andrew Tanner  has presented today for surgery, with the diagnosis of left hip fracture.  The various methods of treatment have been discussed with the patient and family. After consideration of risks, benefits and other options for treatment, the patient has consented to  Procedure(s) with comments: ARTHROPLASTY, HIP, TOTAL, ANTERIOR APPROACH (Left) - [employer] bipolar Hana table  hemi hip as a surgical intervention.  The patient's history has been reviewed, patient examined, no change in status, stable for surgery.  I have reviewed the patient's chart and labs.  Questions were answered to the patient's satisfaction.     Caryn Bee P Tannia Contino

## 2023-07-13 NOTE — Anesthesia Preprocedure Evaluation (Addendum)
 Anesthesia Evaluation  Patient identified by MRN, date of birth, ID band Patient awake    Reviewed: Allergy & Precautions, NPO status , Patient's Chart, lab work & pertinent test results  History of Anesthesia Complications Negative for: history of anesthetic complications  Airway Mallampati: II  TM Distance: >3 FB Neck ROM: Full    Dental  (+) Chipped, Dental Advisory Given   Pulmonary former smoker   breath sounds clear to auscultation       Cardiovascular hypertension,  Rhythm:Regular Rate:Normal  02/2023 ECHO:  1. 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   2. EF 55 to 60%. The LV has normal function, no regional wall motion abnormalities. There is mild LVH of the basal-septal segment. Grade I diastolic dysfunction (impaired relaxation).   3. RVF is normal. The right ventricular size is normal.   4. The mitral valve is normal in structure. Trivial mitral valve regurgitation. No evidence of mitral stenosis.   5. The aortic valve is tricuspid. Aortic valve regurgitation is not visualized. No aortic stenosis is present.     Neuro/Psych   Anxiety Depression    negative neurological ROS     GI/Hepatic ,GERD  Medicated and Controlled,,S/p Whipple for duodenal dysplasia   Endo/Other  diabetes (glu 113), Insulin Dependent    Renal/GU      Musculoskeletal   Abdominal   Peds  Hematology Hb 12.0, plt 196k   Anesthesia Other Findings   Reproductive/Obstetrics                             Anesthesia Physical Anesthesia Plan  ASA: 3  Anesthesia Plan: General   Post-op Pain Management: Tylenol PO (pre-op)*   Induction: Intravenous  PONV Risk Score and Plan: 2 and Ondansetron and Dexamethasone  Airway Management Planned: Oral ETT  Additional Equipment: None  Intra-op Plan:   Post-operative Plan: Extubation in OR  Informed Consent: I have  reviewed the patients History and Physical, chart, labs and discussed the procedure including the risks, benefits and alternatives for the proposed anesthesia with the patient or authorized representative who has indicated his/her understanding and acceptance.     Dental advisory given  Plan Discussed with: CRNA and Surgeon  Anesthesia Plan Comments:        Anesthesia Quick Evaluation

## 2023-07-13 NOTE — Op Note (Signed)
 Orthopaedic Surgery Operative Note (CSN: [id number] ) Date of Surgery: 07/13/2023  Admit Date: 07/12/2023   Diagnoses: Pre-Op Diagnoses: Left displaced femoral neck fracture   Post-Op Diagnosis: Same  Procedures: CPT 27130-Left total hip arthroplasty for fracture  Surgeons : Primary: Roby Lofts, MD  Assistant: Thyra Breed, PA-C  Location: OR 3   Anesthesia: General   Antibiotics: Ancef 2g preop with 1 gm vancomycin powder placed topically   Tourniquet time: None    Estimated Blood Loss: 1200 mL  Complications:(1) Difficult to intubate - unexpected  Comments: Filed from anesthesia note documentation.   Specimens:* No specimens in log *   Implants: Implant Name Type Inv. Item Serial No. Manufacturer Lot No. LRB No. Used Action  SHELL ACET G7 4H 56 SZF - [id number] Shell SHELL ACET G7 4H 56 SZF  Z[employer]MER [employer]ON(ORTH,TRAU,BIO,SG) [id number] Left 1 Implanted  SCREW BN 30X6.5XST STRL ACTB - [id number] Screw SCREW BN 30X6.5XST STRL ACTB  ZIMMER [employer](ORTH,TRAU,BIO,SG) [id number] Left 1 Implanted  LINER ACETAB NN G7 F 36 - [id number] Liner LINER ACETAB NN G7 F 36  Z[employer]MER [employer](ORTH,TRAU,BIO,SG) [id number] Left 1 Implanted  STEM FEM CMTLS STD 14X146 - [id number] Stem STEM FEM CMTLS STD 14X146  Z[employer]MER [employer]ON(ORTH,TRAU,BIO,SG) [id number] Left 1 Implanted  HEAD CERAMIC BIOLOX 36 T1 STD - [id number] Head HEAD CERAMIC BIOLOX 36 T1 STD  ZIMMER [employer]ON(ORTH,TRAU,BIO,SG) [id number] Left 1 Implanted     Indications for Surgery: 73 year old male who sustained a displaced left femoral neck fracture.  Due to his age and his activity level prior to his previous Whipple procedure I recommended proceeding with a left hip total arthroplasty.  Risk and benefits were discussed with the patient.  Risks include but not limited to bleeding, infection, dislocation, leg length discrepancy, periprosthetic fracture, hip pain, nerve and blood vessel injury including lateral femoral cutaneous nerve, DVT, even the possibility  anesthetic complications.  We did proceed with surgery and consent was obtained.  Operative Findings: 1.  Left total hip arthroplasty for femoral neck fracture using [employer] G7 acetabular system OsseoTi 56mm Shell with a 6.5 mm 30 mm length bone screw 2. G7 acetabular system vitamin E highly cross-linked poly size 36 with a neutral liner 3.  [employer] Taperloc reduced distal 14 x 148 mm standard offset press-fit stem 4. Biomet BIOLOX modular ceramic head size 36 mm with a standard neck.  Procedure: The patient was identified in the preoperative holding area. Consent was confirmed with the patient and their family and all questions were answered. The operative extremity was marked after confirmation with the patient. he was then brought back to the operating room by our anesthesia colleagues.  He was placed under general anesthetic and carefully transferred over to a Hana table.  All bony prominences were well-padded.  Fluoroscopic imaging showed the fracture and the unstable nature of his injury.  The left hip was prepped and draped in usual sterile fashion.  A timeout was performed to verify the patient, the procedure, and the extremity.  Preoperative antibiotics were dosed.  I for started out by making a standard anterior approach to the hip.  I made a incision just lateral to the ASIS extending vertically.  Carried it down through skin and subcutaneous tissue.  I incised through the TFL fascia and mobilized the TFL muscle belly to enter the interval between the TFL and sartorius.  Here I placed a retractor over the superior femoral neck.  I was able to identify the perforators from the circumflex artery and cauterized these.  I then was able to visualize the anterior capsule and remove the fat pad.  I then performed a capsulotomy and release the inferior capsule all the way down until I reached and palpated the lesser trochanter.  The I was able to perform a more smooth neck cut at this time.   I removed the comminution of the femoral neck fracture.  I then proceeded to use a corkscrew and a Kocher to remove the femoral head.  I measured this and it measured between 48 and 50 mm.  I then placed a right angle retractor over the anterior acetabulum to visualize the full acetabulum.  I then proceeded to perform a capsular release along the inferior capsule to free the femur for acetabular exposure.  I then was able to excise the labrum as well as the poles and are in the center of the acetabulum.  I then proceeded to use a 49 mm reamer and reamed medially until I was medial enough on imaging as well as visualizing the medial cortex appropriately.  I then increased my reaming until I reached a 55 mm and had good reaming of the anterior and posterior wall of the acetabulum.  I had good bleeding bone.  And I decided to use a 56 mm press-fit.  The acetabular shell was then placed in the center of the acetabulum and confirmed with fluoroscopy it was able to make sure my anteversion as well as my abduction was appropriate and I proceeded to press-fit the cup into the acetabulum.  For reinforcement I proceeded to place a 6.5 millimeter screw into the posterior column to prevent any movement of the acetabular cup.  I then proceeded to place a vitamin E highly cross-linked poly with a neutral liner for 36 mm head.  Once this was finished I then turned my attention to the femur.  I proceeded to deliver the femur through the incision and performed a superior capsular release to be able to appropriately position the femur to broach without any complication.  Once I had adequate exposure I then used a canal finder to find the center of the canal.  I then sequentially broached until I reached good satisfying fit.  I trialed off a size 13 stem.  However with a high offset neck and a -3 head I had significant instability.  As result I proceeded to upsize my stem to a 14 mm and I was able to be trialed off this which  had excellent stability and proceeded to use a standard offset neck due to the high offset is significantly increasing his offset compared to the contralateral limb on fluoroscopy.  I then placed the implant and was able to trial off this.  I decided to use the 36 mm standard neck the ceramic head.  This was placed and the hip was reduced without difficulty.  Final fluoroscopic imaging was obtained.  The hip was moves through a significant external rotation range of motion and there was significant stability.  X-ray showed adequate leg lengths and offset.  The wound was thoroughly irrigated a gram of vancomycin powder was placed into the incision.  Ethibond was used to close the capsule.  The fascia was closed with #1 Vicryl.  The skin was closed with 2-0 Monocryl and 3-0 Monocryl and Dermabond.  Sterile dressing was applied.  The patient was then awoken from anesthesia and taken to the PACU in stable condition.  Post Op Plan/Instructions: Patient will be weightbearing as tolerated to the left lower  extremity.  No hip precautions are needed.  He will receive postoperative Ancef.  He will receive aspirin for DVT prophylaxis.  Will have him mobilize with physical and Occupational Therapy.  I was present and performed the entire surgery.  Thyra Breed, PA-C did assist me throughout the case. An assistant was necessary given the difficulty in approach, maintenance of reduction and ability to instrument the fracture.   Truitt Merle, MD [facility]

## 2023-07-13 NOTE — Anesthesia Postprocedure Evaluation (Signed)
 Anesthesia Post Note  Patient: Andrew Tanner  Procedure(s) Performed: ARTHROPLASTY, HIP, TOTAL, ANTERIOR APPROACH (Left)     Patient location during evaluation: PACU Anesthesia Type: General Level of consciousness: awake and alert, patient cooperative and oriented Pain management: pain level controlled Vital Signs Assessment: post-procedure vital signs reviewed and stable Respiratory status: spontaneous breathing, nonlabored ventilation and respiratory function stable Cardiovascular status: blood pressure returned to baseline and stable Postop Assessment: no apparent nausea or vomiting Anesthetic complications: yes   Encounter Notable Events  Notable Event Outcome Phase Comment  Difficult to intubate - unexpected  Intraprocedure Filed from anesthesia note documentation.    Last Vitals:  Vitals:   07/13/23 1515 07/13/23 1530  BP: 116/74 96/68  Pulse: (!) 103 (!) 101  Resp: 12 10  Temp:    SpO2: 96% 95%    Last Pain:  Vitals:   07/13/23 1515  TempSrc:   PainSc: 7                  Anthon Harpole,E. Sumeya Yontz

## 2023-07-13 NOTE — Progress Notes (Signed)
 Patient returned to unit from PACU. Pt is alert, room air, wife at bedside, call bell placed within reach, telemetry called to place on monitor, ice pack on surgical site, bed in low position, wheels locked and bed alarm on.

## 2023-07-13 NOTE — Transfer of Care (Signed)
 Immediate Anesthesia Transfer of Care Note  Patient: Andrew Tanner  Procedure(s) Performed: ARTHROPLASTY, HIP, TOTAL, ANTERIOR APPROACH (Left)  Patient Location: PACU  Anesthesia Type:General  Level of Consciousness: drowsy and patient cooperative  Airway & Oxygen Therapy: Patient Spontanous Breathing  Post-op Assessment: Report given to RN, Post -op Vital signs reviewed and stable, and Patient moving all extremities X 4  Post vital signs: Reviewed and stable  Last Vitals:  Vitals Value Taken Time  BP 132/78 07/13/23 1423  Temp    Pulse 102 07/13/23 1428  Resp 17 07/13/23 1428  SpO2 96 % 07/13/23 1428  Vitals shown include unfiled device data.  Last Pain:  Vitals:   07/13/23 0929  TempSrc:   PainSc: 0-No pain      Patients Stated Pain Goal: 0 (07/13/23 0259)  Complications:  Encounter Notable Events  Notable Event Outcome Phase Comment  Difficult to intubate - unexpected  Intraprocedure Filed from anesthesia note documentation.

## 2023-07-13 NOTE — Consult Note (Signed)
 Reason for Consult:Left hip fx Referring Physician: [name] Time called: 1610 Time at bedside: 0859   Andrew Tanner is an 73 y.o. male.  HPI: Andrew Tanner was at a group home trying to build up strength following a Whipple done 04/27/23. He got up onto his RW but had left sided weakness and fell to the floor. He had immediate left hip pain and could not get up. He was brought to the ED where x-rays showed a hip fx and orthopedic surgery was consulted. Prior to the Whipple he was doing about 1 hour of exercise per day without any assistive devices. He lives at home with his wife.  Past Medical History:  Diagnosis Date   Anxiety    Atrial myxoma    Basal cell carcinoma    Cancer (HCC)    basal cell skin CA   Cataract    Depression    Diabetes mellitus without complication (HCC)    Diabetic neuropathy (HCC)    Duodenal adenoma    Hyperlipidemia    Hypertension    IC (interstitial cystitis)    Neuromuscular disorder (HCC) 01/09/21   Peripheral  Neuropathy   Pancreatic lesion    Squamous cell carcinoma of skin     Past Surgical History:  Procedure Laterality Date   BIOPSY  12/05/2022   Procedure: BIOPSY;  Surgeon: [name], MD;  Location: [facility];  Service: Gastroenterology;;   CATARACT EXTRACTION, BILATERAL     ESOPHAGOGASTRODUODENOSCOPY N/A 12/05/2022   Procedure: ESOPHAGOGASTRODUODENOSCOPY (EGD);  Surgeon: [name], MD;  Location: [facility];  Service: Gastroenterology;  Laterality: N/A;   EUS N/A 12/05/2022   Procedure: UPPER ENDOSCOPIC ULTRASOUND (EUS) RADIAL;  Surgeon: [name], MD;  Location: [facility];  Service: Gastroenterology;  Laterality: N/A;   FINE NEEDLE ASPIRATION N/A 12/05/2022   Procedure: FINE NEEDLE ASPIRATION (FNA) LINEAR;  Surgeon: [name], MD;  Location: [facility];  Service: Gastroenterology;  Laterality: N/A;   HERNIA REPAIR N/A    inguinal   NECK SURGERY     ant/post   SPINE  SURGERY     SUBMUCOSAL TATTOO INJECTION  12/05/2022   Procedure: SUBMUCOSAL TATTOO INJECTION;  Surgeon: [name], MD;  Location: [facility];  Service: Gastroenterology;;   TRANSURETHRAL RESECTION OF PROSTATE     TURP VAPORIZATION     WHIPPLE PROCEDURE N/A 04/27/2023   Procedure: WHIPPLE PROCEDURE;  Surgeon: [name], MD;  Location: [facility];  Service: General;  Laterality: N/A;    Family History  Problem Relation Age of Onset   Lung cancer Mother    Heart disease Father    Hyperlipidemia Father    Diabetes Father        type 1   Diabetes Brother    Depression Brother    Renal cancer Brother    Anxiety disorder Brother    Stomach cancer Neg Hx    Colon cancer Neg Hx    Pancreatic cancer Neg Hx    Esophageal cancer Neg Hx    Rectal cancer Neg Hx     Social History:  reports that he quit smoking about 41 years ago. His smoking use included cigarettes. He started smoking about 46 years ago. He has a 5 pack-year smoking history. He has never used smokeless tobacco. He reports that he does not currently use alcohol. He reports that he does not use drugs.  Allergies: No Known Allergies  Medications: I have reviewed the patient's current medications.  Results for orders placed  or performed during the hospital encounter of 07/12/23 (from the past 48 hours)  CBG monitoring, ED     Status: Abnormal   Collection Time: 07/12/23  9:07 PM  Result Value Ref Range   Glucose-Capillary 178 (H) 70 - 99 mg/dL    Comment: Glucose reference range applies only to samples taken after fasting for at least 8 hours.  Basic metabolic panel     Status: Abnormal   Collection Time: 07/12/23  9:58 PM  Result Value Ref Range   Sodium 135 135 - 145 mmol/L   Potassium 4.0 3.5 - 5.1 mmol/L   Chloride 109 98 - 111 mmol/L   CO2 19 (L) 22 - 32 mmol/L   Glucose, Bld 214 (H) 70 - 99 mg/dL    Comment: Glucose reference range applies only to samples taken after fasting for at least 8 hours.    BUN 12 8 - 23 mg/dL   Creatinine, Ser 0.98 0.61 - 1.24 mg/dL   Calcium 9.1 8.9 - 11.9 mg/dL   GFR, Estimated >14 >78 mL/min    Comment: (NOTE) Calculated using the CKD-EPI Creatinine Equation (2021)    Anion gap 7 5 - 15    Comment: Performed at [facility], [address] [place], [place] [address]  CBC     Status: Abnormal   Collection Time: 07/12/23  9:58 PM  Result Value Ref Range   WBC 9.7 4.0 - 10.5 K/uL   RBC 4.05 (L) 4.22 - 5.81 MIL/uL   Hemoglobin 11.6 (L) 13.0 - 17.0 g/dL   HCT 13.0 (L) 86.5 - 78.4 %   MCV 86.4 80.0 - 100.0 fL   MCH 28.6 26.0 - 34.0 pg   MCHC 33.1 30.0 - 36.0 g/dL   RDW 69.6 29.5 - 28.4 %   Platelets 227 150 - 400 K/uL   nRBC 0.0 0.0 - 0.2 %    Comment: Performed at [facility], [address], [place], [place] [address]  Glucose, capillary     Status: Abnormal   Collection Time: 07/13/23  2:54 AM  Result Value Ref Range   Glucose-Capillary 155 (H) 70 - 99 mg/dL    Comment: Glucose reference range applies only to samples taken after fasting for at least 8 hours.  Type and screen [facility]     Status: None   Collection Time: 07/13/23  6:19 AM  Result Value Ref Range   ABO/RH(D) A POS    Antibody Screen NEG    Sample Expiration      07/16/2023,2359 Performed at [facility], [address], [place], [place] [address]   CBC     Status: Abnormal   Collection Time: 07/13/23  6:25 AM  Result Value Ref Range   WBC 8.4 4.0 - 10.5 K/uL   RBC 4.13 (L) 4.22 - 5.81 MIL/uL   Hemoglobin 12.0 (L) 13.0 - 17.0 g/dL   HCT 25.3 (L) 66.4 - 40.3 %   MCV 86.4 80.0 - 100.0 fL   MCH 29.1 26.0 - 34.0 pg   MCHC 33.6 30.0 - 36.0 g/dL   RDW 47.4 25.9 - 56.3 %   Platelets 196 150 - 400 K/uL   nRBC 0.0 0.0 - 0.2 %    Comment: Performed at [facility], [address], [place], [place] [address]  Basic metabolic panel     Status: Abnormal   Collection Time: 07/13/23  6:25 AM  Result Value Ref Range  Sodium 136 135 - 145  mmol/L   Potassium 4.0 3.5 - 5.1 mmol/L   Chloride 106 98 - 111 mmol/L   CO2 23 22 - 32 mmol/L   Glucose, Bld 144 (H) 70 - 99 mg/dL    Comment: Glucose reference range applies only to samples taken after fasting for at least 8 hours.   BUN 9 8 - 23 mg/dL   Creatinine, Ser 1.61 0.61 - 1.24 mg/dL   Calcium 9.2 8.9 - 09.6 mg/dL   GFR, Estimated >04 >54 mL/min    Comment: (NOTE) Calculated using the CKD-EPI Creatinine Equation (2021)    Anion gap 7 5 - 15    Comment: Performed at [facility], [address] [place], [place] [address]  Magnesium     Status: None   Collection Time: 07/13/23  6:25 AM  Result Value Ref Range   Magnesium 1.7 1.7 - 2.4 mg/dL    Comment: Performed at [facility], [address] [place], [place] [address]  Phosphorus     Status: None   Collection Time: 07/13/23  6:25 AM  Result Value Ref Range   Phosphorus 3.0 2.5 - 4.6 mg/dL    Comment: Performed at [facility], [address], [place], [place] [address]  Protime-INR     Status: None   Collection Time: 07/13/23  6:25 AM  Result Value Ref Range   Prothrombin Time 14.7 11.4 - 15.2 seconds   INR 1.1 0.8 - 1.2    Comment: (NOTE) INR goal varies based on device and disease states. Performed at [facility], [address] [place], [place] [address]   Glucose, capillary     Status: Abnormal   Collection Time: 07/13/23  7:52 AM  Result Value Ref Range   Glucose-Capillary 118 (H) 70 - 99 mg/dL    Comment: Glucose reference range applies only to samples taken after fasting for at least 8 hours.    CT HIP LEFT WO CONTRAST Result Date: 07/13/2023 CLINICAL DATA:  Left hip fracture. EXAM: CT OF THE LEFT HIP WITHOUT CONTRAST TECHNIQUE: Multidetector CT imaging of the left hip was performed according to the standard protocol. Multiplanar CT image reconstructions were also generated. RADIATION DOSE REDUCTION: This exam was performed according to the departmental dose-optimization program  which includes automated exposure control, adjustment of the mA and/or kV according to patient size and/or use of iterative reconstruction technique. COMPARISON:  AP pelvis and left hip films yesterday, CT abdomen pelvis with contrast 05/30/2023. FINDINGS: Bones/Joint/Cartilage There is mild osteopenia. There is an acute basicervical transverse oblique distal left femoral neck fracture, with comminution and mild impaction at the fracture site, and about 1 cortex width of posterior and lateral displacement of the main distal fragment as well as mild external rotation of the distal fragment. There is no further evidence fractures. There is mild narrowing and spurring at the hip joint, pubic symphysis and left SI joint. Degenerative disc disease and spondylosis L5-S1. Ligaments Suboptimally assessed by CT. Muscles and Tendons There is swelling in the lateral aspect of the left gluteus maximus muscle most likely a due to direct trauma. There is patchy fluid in between muscle groups in the visualized anterior proximal thigh. No space-occupying intramuscular hematoma is seen. There is a grossly normal muscle bulk for age. No acute tendon findings. Soft tissues Partial visualization of enlarged prostate impressing into the bladder base. Sigmoid diverticulosis is noted without evidence of diverticulitis. In the left pelvis no  free fluid, free air or free hemorrhage is seen. There is no space-occupying hematoma in the visualized upper thigh. No mass. IMPRESSION: 1. Acute closed basicervical transverse oblique distal left femoral neck fracture with comminution and mild impaction at the fracture site, and about 1 cortex width of posterior and lateral displacement of the main distal fragment as well as mild external rotation of the distal fragment. 2. Osteopenia and degenerative change. 3. Swelling in the lateral aspect of the left gluteus maximus muscle most likely due to direct trauma. 4. Patchy fluid in between muscle groups  in the visualized anterior proximal thigh. No space-occupying hematoma is seen. 5. Enlarged prostate impressing into the bladder base. 6. Sigmoid diverticulosis. Electronically Signed   By: Almira Bar M.D.   On: 07/13/2023 06:43   DG Hip Unilat With Pelvis 2-3 Views Left Result Date: 07/12/2023 CLINICAL DATA:  Fall, left hip pain EXAM: DG HIP (WITH OR WITHOUT PELVIS) 2-3V LEFT COMPARISON:  None Available. FINDINGS: There is a left femoral neck fracture. No significant angulation. No subluxation or dislocation. Hip joints and SI joints symmetric. IMPRESSION: Left femoral neck fracture. Electronically Signed   By: Charlett Nose M.D.   On: 07/12/2023 22:02    Review of Systems  HENT:  Negative for ear discharge, ear pain, hearing loss and tinnitus.   Eyes:  Negative for photophobia and pain.  Respiratory:  Negative for cough and shortness of breath.   Cardiovascular:  Negative for chest pain.  Gastrointestinal:  Negative for abdominal pain, nausea and vomiting.  Genitourinary:  Negative for dysuria, flank pain, frequency and urgency.  Musculoskeletal:  Positive for arthralgias (Left hip). Negative for back pain, myalgias and neck pain.  Neurological:  Negative for dizziness and headaches.  Hematological:  Does not bruise/bleed easily.  Psychiatric/Behavioral:  The patient is not nervous/anxious.    Blood pressure (!) 163/91, pulse 83, temperature 98.2 F (36.8 C), temperature source Oral, resp. rate 17, height 5\' 11"  (1.803 m), weight 68 kg, SpO2 100%. Physical Exam Constitutional:      General: He is not in acute distress.    Appearance: He is well-developed. He is not diaphoretic.  HENT:     Head: Normocephalic and atraumatic.  Eyes:     General: No scleral icterus.       Right eye: No discharge.        Left eye: No discharge.     Conjunctiva/sclera: Conjunctivae normal.  Cardiovascular:     Rate and Rhythm: Normal rate and regular rhythm.  Pulmonary:     Effort: Pulmonary effort  is normal. No respiratory distress.  Musculoskeletal:     Cervical back: Normal range of motion.     Comments: LLE No traumatic wounds, ecchymosis, or rash  Mod TTP hip  No knee or ankle effusion  Knee stable to varus/ valgus and anterior/posterior stress  Sens DPN, SPN, TN intact  Motor EHL, ext, flex, evers 5/5  DP 2+, PT 2+, No significant edema  Skin:    General: Skin is warm and dry.  Neurological:     Mental Status: He is alert.  Psychiatric:        Mood and Affect: Mood normal.        Behavior: Behavior normal.     Assessment/Plan: Left hip fx -- Plan THA today with Dr. Jena Gauss. Please keep NPO. Multiple medical problems including basal cell carcinoma, diabetes mellitus, diabetic neuropathy, neuromuscular disorder, hyperlipidemia, and malignant neuroendocrine tumor of duodenum -- per primary service  Freeman Caldron, PA-C Orthopedic Surgery [phone] 07/13/2023, 9:11 AM

## 2023-07-13 NOTE — TOC CAGE-AID Note (Signed)
 Transition of Care [facility]) - CAGE-AID Screening   Patient Details  Name: Andrew Tanner MRN: [id number] Date of Birth: August 07, 1950  Clinical Narrative:  Patient denies any current alcohol or drug use, denies need for substance abuse resources at this time.  CAGE-AID Screening:    Have You Ever Felt You Ought to Cut Down on Your Drinking or Drug Use?: No Have People Annoyed You By Critizing Your Drinking Or Drug Use?: No Have You Felt Bad Or Guilty About Your Drinking Or Drug Use?: No Have You Ever Had a Drink or Used Drugs First Thing In The Morning to Steady Your Nerves or to Get Rid of a Hangover?: No CAGE-AID Score: 0  Substance Abuse Education Offered: No

## 2023-07-13 NOTE — H&P (Signed)
 History and Physical  Andrew Tanner WUJ:[id number] DOB: [birth year] DOA: 07/12/2023  Referring physician: Trellis Paganini  PCP: Jeani Sow, MD  Outpatient Specialists: Orthopedic surgery, general surgery. Patient coming from: Group Home.  Chief Complaint: Fall  HPI: Andrew Tanner is a 73 y.o. male with medical history significant for type 2 diabetes, hyperlipidemia, moderate protein calorie malnutrition, who presents to the ER after mechanical fall.  The patient fell around 7 AM while doing PT exercises with his walker.  Landed on his left hip.  No reported subjective fevers or chills prior to that.  EMS was activated.  In the ER, hemodynamically stable.  Left hip x-ray shows left femoral neck fracture.  EDP discussed the case with orthopedic surgery.  Plan for repair in the morning.  TRH, hospitalist service, was asked to admit.  Pain control initiated.  ED Course: Temperature 98.7.  BP 159/90, pulse 99, associated 18, O2 saturation 99% room air.  Lab studies notable for CRP bicarb 19, glucose 214.  Hemoglobin 11.6.  Review of Systems: Review of systems as noted in the HPI. All other systems reviewed and are negative.   Past Medical History:  Diagnosis Date   Anxiety    Atrial myxoma    Basal cell carcinoma    Cancer (HCC)    basal cell skin CA   Cataract    Depression    Diabetes mellitus without complication (HCC)    Diabetic neuropathy (HCC)    Duodenal adenoma    Hyperlipidemia    Hypertension    IC (interstitial cystitis)    Neuromuscular disorder (HCC) 01/09/21   Peripheral  Neuropathy   Pancreatic lesion    Squamous cell carcinoma of skin    Past Surgical History:  Procedure Laterality Date   BIOPSY  12/05/2022   Procedure: BIOPSY;  Surgeon: [name], MD;  Location: [facility];  Service: Gastroenterology;;   CATARACT EXTRACTION, BILATERAL     ESOPHAGOGASTRODUODENOSCOPY N/A 12/05/2022   Procedure: ESOPHAGOGASTRODUODENOSCOPY (EGD);   Surgeon: [name], MD;  Location: [facility];  Service: Gastroenterology;  Laterality: N/A;   EUS N/A 12/05/2022   Procedure: UPPER ENDOSCOPIC ULTRASOUND (EUS) RADIAL;  Surgeon: [name], MD;  Location: [facility];  Service: Gastroenterology;  Laterality: N/A;   FINE NEEDLE ASPIRATION N/A 12/05/2022   Procedure: FINE NEEDLE ASPIRATION (FNA) LINEAR;  Surgeon: [name], MD;  Location: [facility];  Service: Gastroenterology;  Laterality: N/A;   HERNIA REPAIR N/A    inguinal   NECK SURGERY     ant/post   SPINE SURGERY     SUBMUCOSAL TATTOO INJECTION  12/05/2022   Procedure: SUBMUCOSAL TATTOO INJECTION;  Surgeon: [name], MD;  Location: [facility];  Service: Gastroenterology;;   TRANSURETHRAL RESECTION OF PROSTATE     TURP VAPORIZATION     WHIPPLE PROCEDURE N/A 04/27/2023   Procedure: WHIPPLE PROCEDURE;  Surgeon: [name], MD;  Location: [facility];  Service: General;  Laterality: N/A;    Social History:  reports that he quit smoking about 41 years ago. His smoking use included cigarettes. He started smoking about 46 years ago. He has a 5 pack-year smoking history. He has never used smokeless tobacco. He reports that he does not currently use alcohol. He reports that he does not use drugs.   No Known Allergies  Family History  Problem Relation Age of Onset   Lung cancer Mother    Heart disease Father    Hyperlipidemia Father    Diabetes Father  type 1   Diabetes Brother    Depression Brother    Renal cancer Brother    Anxiety disorder Brother    Stomach cancer Neg Hx    Colon cancer Neg Hx    Pancreatic cancer Neg Hx    Esophageal cancer Neg Hx    Rectal cancer Neg Hx       Prior to Admission medications   Medication Sig Start Date End Date Taking? Authorizing Provider  acetaminophen (TYLENOL) 500 MG tablet Take 2 tablets (1,000 mg total) by mouth every 6 (six) hours as needed for mild pain (pain score 1-3).  05/10/23   [name], MD  alfuzosin (UROXATRAL) 10 MG 24 hr tablet Take 1 tablet (10 mg total) by mouth daily with breakfast. 03/20/23   Jeani Sow, MD  B-D UF III MINI PEN NEEDLES 31G X 5 MM MISC USE DAILY AT 12 NOON 03/31/23   Jeani Sow, MD  clonazePAM (KLONOPIN) 0.5 MG tablet Take 1.5 mg by mouth at bedtime.    [provider]  Continuous Glucose Sensor (DEXCOM G7 SENSOR) MISC Use to monitor glucose continuously, change every 10 days 02/22/23   Carlus Pavlov, MD  feeding supplement (ENSURE ENLIVE / ENSURE PLUS) LIQD Take 237 mLs by mouth 2 (two) times daily between meals. Patient not taking: Reported on 06/26/2023 05/10/23   [name], MD  FLUoxetine (PROZAC) 20 MG capsule Take 60 mg by mouth every morning. 12/19/21   [provider]  glucose blood test strip 1 each by Other route in the morning and at bedtime. Use as instructed checking once per day, or if new symptoms. 01/16/23   Jeani Sow, MD  hydrOXYzine (ATARAX/VISTARIL) 50 MG tablet Take 50 mg by mouth at bedtime as needed (sleep/anxiety.). 12/16/20   [provider]  insulin aspart (NOVOLOG) 100 UNIT/ML injection Inject 4 Units into the skin 3 (three) times daily with meals. 06/08/23   [name], MD  insulin glargine (LANTUS SOLOSTAR) 100 UNIT/ML Solostar Pen Inject 12 Units into the skin at bedtime. 06/08/23   [name], MD  Insulin Lispro-aabc (LYUMJEV KWIKPEN) 100 UNIT/ML KwikPen Inject 3-6 units under skin of insulin before dinner Patient not taking: Reported on 06/26/2023 02/08/23   Carlus Pavlov, MD  Insulin Pen Needle 32G X 4 MM MISC Use 2x a day 02/08/23   Carlus Pavlov, MD  ketoconazole (NIZORAL) 2 % shampoo APPLY TO SCALP AND FACE 2 TO 3 TIMES A WEEK. Patient taking differently: Apply 1 Application topically See admin instructions. APPLY TO SCALP AND FACE 2 TO 3 TIMES A WEEK. 04/16/23   Terri Piedra, DO  lipase/protease/amylase (CREON) 36000 UNITS CPEP capsule  Take 2 capsules (72,000 Units total) by mouth 3 (three) times daily before meals. 05/18/23   [name], MD  megestrol (MEGACE) 400 MG/10ML suspension Take 10 mLs (400 mg total) by mouth daily. 05/10/23   [name], MD  methocarbamol (ROBAXIN) 500 MG tablet Take 1 tablet (500 mg total) by mouth every 6 (six) hours as needed for muscle spasms (pain). 05/10/23   [name], MD  Multiple Vitamin (MULTIVITAMIN WITH MINERALS) TABS tablet Take 1 tablet by mouth daily. Patient not taking: Reported on 06/26/2023 06/09/23   [name], MD  Nystatin (GERHARDT'S BUTT CREAM) CREA Apply 1 Application topically 2 (two) times daily. Patient not taking: Reported on 06/26/2023 06/08/23   [name], MD  nystatin-triamcinolone ointment [facility]) Apply to scalp and face twice a day for up to 1 week  and stop Patient not taking: Reported on 06/26/2023 11/23/22   Terri Piedra, DO  oxyCODONE (OXY IR/ROXICODONE) 5 MG immediate release tablet Take 1 tablet (5 mg total) by mouth every 4 (four) hours as needed for severe pain (pain score 7-10), breakthrough pain or moderate pain (pain score 4-6). 05/10/23   [name], MD  pantoprazole (PROTONIX) 40 MG tablet Take 1 tablet (40 mg total) by mouth at bedtime. 05/10/23   [name], MD  polyethylene glycol (MIRALAX / GLYCOLAX) 17 g packet Take 17 g by mouth daily as needed (constipation.).    [provider]  prochlorperazine (COMPAZINE) 10 MG tablet Take 1 tablet (10 mg total) by mouth every 6 (six) hours as needed for nausea or vomiting (Use for nausea and / or vomiting unresolved with ondansetron (Zofran).). 05/10/23   [name], MD  rosuvastatin (CRESTOR) 20 MG tablet Take 1 tablet (20 mg total) by mouth daily. 05/10/23   [name], MD  simethicone (MYLICON) 80 MG chewable tablet Chew 1 tablet (80 mg total) by mouth every 6 (six) hours as needed for flatulence. 05/10/23   [name], MD    Physical Exam: BP (!) 140/80 (BP Location: Left Arm)    Pulse 97   Temp 100.1 F (37.8 C)   Resp 18   Ht 5\' 11"  (1.803 m)   Wt 68 kg   SpO2 99%   BMI 20.92 kg/m   General: 73 y.o. year-old male well developed well nourished in no acute distress.  Alert and oriented x3. Cardiovascular: Regular rate and rhythm with no rubs or gallops.  No thyromegaly or JVD noted.  No lower extremity edema. 2/4 pulses in all 4 extremities. Respiratory: Clear to auscultation with no wheezes or rales. Good inspiratory effort. Abdomen: Soft nontender nondistended with normal bowel sounds x4 quadrants. Muskuloskeletal: No cyanosis, clubbing or edema noted bilaterally Neuro: CN II-XII intact, strength, sensation, reflexes Skin: No ulcerative lesions noted or rashes Psychiatry: Judgement and insight appear normal. Mood is appropriate for condition and setting          Labs on Admission:  Basic Metabolic Panel: Recent Labs  Lab 07/12/23 2158  NA 135  K 4.0  CL 109  CO2 19*  GLUCOSE 214*  BUN 12  CREATININE 0.76  CALCIUM 9.1   Liver Function Tests: No results for input(s): "AST", "ALT", "ALKPHOS", "BILITOT", "PROT", "ALBUMIN" in the last 168 hours. No results for input(s): "LIPASE", "AMYLASE" in the last 168 hours. No results for input(s): "AMMONIA" in the last 168 hours. CBC: Recent Labs  Lab 07/12/23 2158  WBC 9.7  HGB 11.6*  HCT 35.0*  MCV 86.4  PLT 227   Cardiac Enzymes: No results for input(s): "CKTOTAL", "CKMB", "CKMBINDEX", "TROPONINI" in the last 168 hours.  BNP (last 3 results) No results for input(s): "BNP" in the last 8760 hours.  ProBNP (last 3 results) No results for input(s): "PROBNP" in the last 8760 hours.  CBG: Recent Labs  Lab 07/12/23 2107  GLUCAP 178*    Radiological Exams on Admission: DG Hip Unilat With Pelvis 2-3 Views Left Result Date: 07/12/2023 CLINICAL DATA:  Fall, left hip pain EXAM: DG HIP (WITH OR WITHOUT PELVIS) 2-3V LEFT COMPARISON:  None Available. FINDINGS: There is a left femoral neck fracture.  No significant angulation. No subluxation or dislocation. Hip joints and SI joints symmetric. IMPRESSION: Left femoral neck fracture. Electronically Signed   By: Charlett Nose M.D.   On: 07/12/2023 22:02    EKG: I independently  viewed the EKG done and my findings are as followed: None available at the time of his visit.  Assessment/Plan Present on Admission:  Fracture of femoral neck, left (HCC)  Principal Problem:   Fracture of femoral neck, left (HCC)  Left femoral neck fracture, seen on x-ray, post mechanical fall, POA Pain control in place N.p.o. until seen by orthopedic surgery, Dr. Eulah Pont. Plan for orthopedic repair on 07/13/2023.  Type 2 diabetes with hyperglycemia Hemoglobin A1c 6.8 on 04/21/2023 Insulin sliding scale while NPO. Gentle IV fluid hydration.  Non anion gap metabolic acidosis Serum bicarb 19, anion gap 7 Continue gentle IV hydration Repeat BMP in the morning  Moderate protein calorie malnutrition Albumin 2.4, moderate muscle mass loss Encourage oral protein calorie intake and when no longer NPO.  Hyperlipidemia Resume home Crestor.  Elevated BP, likely contributed by pain Not on antihypertensives prior to admission Pain control Monitor vital signs   Time: 75 minutes.   DVT prophylaxis: SCDs.  Code Status: Full code.  Family Communication: Wife at bedside  Disposition Plan: Admitted to telemetry surgical unit.  Consults called: Orthopedic surgery.  Admission status: Inpatient status.   Status is: Inpatient The patient requires at least 2 midnights for further evaluation and treatment of present condition.   Darlin Drop MD [facility] Pager [phone]  If 7PM-7AM, please contact night-coverage [email or web address] Password [facility]  07/13/2023, 12:18 AM

## 2023-07-13 NOTE — Progress Notes (Signed)
 PROGRESS NOTE    Draden Cottingham  ZOX:[id number] DOB: 06-18-1950 DOA: 07/12/2023 PCP: Jeani Sow, MD   Brief Narrative:  Andrew Tanner is a 73 y.o. male with medical history significant for type 2 diabetes, hyperlipidemia, moderate protein calorie malnutrition, who presents to the ER after mechanical fall -imaging confirms left femoral neck fracture, hospitalist called for admission.  Orthopedics called in consult.    Assessment & Plan:   Principal Problem:   Fracture of femoral neck, left (HCC)  Left femoral neck fracture, seen on x-ray, post mechanical fall, POA Pain currently well-controlled N.p.o. until seen by orthopedic surgery, Dr. Eulah Pont. Plan for orthopedic repair later today on 07/13/2023 pending OR schedule   Type 2 diabetes with hyperglycemia Hemoglobin A1c 6.8 on 04/21/2023 Continue sliding scale insulin, hypoglycemic protocol Continue IV fluids until p.o. intake is appropriate   Non anion gap metabolic acidosis, mild, resolved Resolved with IV fluids   Moderate protein calorie malnutrition Albumin 2.4, moderate muscle mass loss Encourage oral protein calorie intake post operatively   Hyperlipidemia Continue statin   Elevated BP, likely contributed by pain No diagnosis of hypertension  DVT prophylaxis: SCDs Start: 07/13/23 0018 Code Status:   Code Status: Full Code Family Communication: None present  Status is: Inpatient  Dispo: The patient is from: Group home              Anticipated d/c is to: To be determined, likely SNF              Anticipated d/c date is: 48 to 72 hours pending clinical course              Patient currently not medically stable for discharge  Consultants:  Orthopedic surgery  Procedures:  Pending above, planned ORIF, left femoral neck fracture  Antimicrobials:  Perioperatively  Subjective: No acute issues or events overnight, denies nausea vomiting diarrhea constipation any fevers chills chest  pain  Objective: Vitals:   07/12/23 2107 07/12/23 2108 07/13/23 0200  BP:  (!) 140/80 (!) 159/90  Pulse:  97 99  Resp:  18 18  Temp:  100.1 F (37.8 C) 98.7 F (37.1 C)  TempSrc:   Oral  SpO2:  99% 99%  Weight: 68 kg    Height: 5\' 11"  (1.803 m)     No intake or output data in the 24 hours ending 07/13/23 0711 Filed Weights   07/12/23 2107  Weight: 68 kg    Examination:  General:  Pleasantly resting in bed, No acute distress. HEENT:  Normocephalic atraumatic.  Sclerae nonicteric, noninjected.  Extraocular movements intact bilaterally. Neck:  Without mass or deformity.  Trachea is midline. Lungs:  Clear to auscultate bilaterally without rhonchi, wheeze, or rales. Heart:  Regular rate and rhythm.  Without murmurs, rubs, or gallops. Abdomen:  Soft, nontender, nondistended.  Without guarding or rebound. Extremities: Without cyanosis, clubbing, edema, or obvious deformity. Skin:  Warm and dry, no erythema.   Data Reviewed: I have personally reviewed following labs and imaging studies  CBC: Recent Labs  Lab 07/12/23 2158  WBC 9.7  HGB 11.6*  HCT 35.0*  MCV 86.4  PLT 227   Basic Metabolic Panel: Recent Labs  Lab 07/12/23 2158  NA 135  K 4.0  CL 109  CO2 19*  GLUCOSE 214*  BUN 12  CREATININE 0.76  CALCIUM 9.1   GFR: Estimated Creatinine Clearance: 77.9 mL/min (by C-G formula based on SCr of 0.76 mg/dL).  CBG: Recent Labs  Lab 07/12/23 2107  GLUCAP 178*  No results found for this or any previous visit (from the past 240 hours).   Radiology Studies: CT HIP LEFT WO CONTRAST Result Date: 07/13/2023 CLINICAL DATA:  Left hip fracture. EXAM: CT OF THE LEFT HIP WITHOUT CONTRAST TECHNIQUE: Multidetector CT imaging of the left hip was performed according to the standard protocol. Multiplanar CT image reconstructions were also generated. RADIATION DOSE REDUCTION: This exam was performed according to the departmental dose-optimization program which includes  automated exposure control, adjustment of the mA and/or kV according to patient size and/or use of iterative reconstruction technique. COMPARISON:  AP pelvis and left hip films yesterday, CT abdomen pelvis with contrast 05/30/2023. FINDINGS: Bones/Joint/Cartilage There is mild osteopenia. There is an acute basicervical transverse oblique distal left femoral neck fracture, with comminution and mild impaction at the fracture site, and about 1 cortex width of posterior and lateral displacement of the main distal fragment as well as mild external rotation of the distal fragment. There is no further evidence fractures. There is mild narrowing and spurring at the hip joint, pubic symphysis and left SI joint. Degenerative disc disease and spondylosis L5-S1. Ligaments Suboptimally assessed by CT. Muscles and Tendons There is swelling in the lateral aspect of the left gluteus maximus muscle most likely a due to direct trauma. There is patchy fluid in between muscle groups in the visualized anterior proximal thigh. No space-occupying intramuscular hematoma is seen. There is a grossly normal muscle bulk for age. No acute tendon findings. Soft tissues Partial visualization of enlarged prostate impressing into the bladder base. Sigmoid diverticulosis is noted without evidence of diverticulitis. In the left pelvis no free fluid, free air or free hemorrhage is seen. There is no space-occupying hematoma in the visualized upper thigh. No mass. IMPRESSION: 1. Acute closed basicervical transverse oblique distal left femoral neck fracture with comminution and mild impaction at the fracture site, and about 1 cortex width of posterior and lateral displacement of the main distal fragment as well as mild external rotation of the distal fragment. 2. Osteopenia and degenerative change. 3. Swelling in the lateral aspect of the left gluteus maximus muscle most likely due to direct trauma. 4. Patchy fluid in between muscle groups in the  visualized anterior proximal thigh. No space-occupying hematoma is seen. 5. Enlarged prostate impressing into the bladder base. 6. Sigmoid diverticulosis. Electronically Signed   By: Almira Bar M.D.   On: 07/13/2023 06:43   DG Hip Unilat With Pelvis 2-3 Views Left Result Date: 07/12/2023 CLINICAL DATA:  Fall, left hip pain EXAM: DG HIP (WITH OR WITHOUT PELVIS) 2-3V LEFT COMPARISON:  None Available. FINDINGS: There is a left femoral neck fracture. No significant angulation. No subluxation or dislocation. Hip joints and SI joints symmetric. IMPRESSION: Left femoral neck fracture. Electronically Signed   By: Charlett Nose M.D.   On: 07/12/2023 22:02    Scheduled Meds:  insulin aspart  0-5 Units Subcutaneous QHS   insulin aspart  0-9 Units Subcutaneous TID WC   rosuvastatin  20 mg Oral Daily   Continuous Infusions:  lactated ringers 50 mL/hr at 07/13/23 0629     LOS: 0 days   Time spent:  Azucena Fallen, DO [facility]  If 7PM-7AM, please contact night-coverage [email or web address]  07/13/2023, 7:11 AM

## 2023-07-13 NOTE — Telephone Encounter (Signed)
Placed on providers desk for signature.

## 2023-07-14 ENCOUNTER — Encounter (HOSPITAL_COMMUNITY): Payer: Self-pay | Admitting: Student

## 2023-07-14 DIAGNOSIS — S72002A Fracture of unspecified part of neck of left femur, initial encounter for closed fracture: Secondary | ICD-10-CM | POA: Diagnosis not present

## 2023-07-14 LAB — CBC
HCT: 24.7 % — ABNORMAL LOW (ref 39.0–52.0)
Hemoglobin: 8.3 g/dL — ABNORMAL LOW (ref 13.0–17.0)
MCH: 29.1 pg (ref 26.0–34.0)
MCHC: 33.6 g/dL (ref 30.0–36.0)
MCV: 86.7 fL (ref 80.0–100.0)
Platelets: 154 10*3/uL (ref 150–400)
RBC: 2.85 MIL/uL — ABNORMAL LOW (ref 4.22–5.81)
RDW: 14.4 % (ref 11.5–15.5)
WBC: 9.2 10*3/uL (ref 4.0–10.5)
nRBC: 0 % (ref 0.0–0.2)

## 2023-07-14 LAB — BASIC METABOLIC PANEL WITH GFR
Anion gap: 7 (ref 5–15)
BUN: 11 mg/dL (ref 8–23)
CO2: 20 mmol/L — ABNORMAL LOW (ref 22–32)
Calcium: 9 mg/dL (ref 8.9–10.3)
Chloride: 106 mmol/L (ref 98–111)
Creatinine, Ser: 0.89 mg/dL (ref 0.61–1.24)
GFR, Estimated: >60 mL/min (ref >60)
Glucose, Bld: 213 mg/dL — ABNORMAL HIGH (ref 70–99)
Potassium: 4.6 mmol/L (ref 3.5–5.1)
Sodium: 133 mmol/L — ABNORMAL LOW (ref 135–145)

## 2023-07-14 LAB — URINALYSIS, ROUTINE W REFLEX MICROSCOPIC
Bilirubin Urine: NEGATIVE
Glucose, UA: 100 mg/dL — AB
Hgb urine dipstick: NEGATIVE
Ketones, ur: NEGATIVE mg/dL
Leukocytes,Ua: NEGATIVE
Nitrite: NEGATIVE
Protein, ur: NEGATIVE mg/dL
Specific Gravity, Urine: >1.03 — ABNORMAL HIGH (ref 1.005–1.030)
pH: 6 (ref 5.0–8.0)

## 2023-07-14 LAB — POCT I-STAT, CHEM 8
BUN: 8 mg/dL (ref 8–23)
Calcium, Ion: 1.3 mmol/L (ref 1.15–1.40)
Chloride: 104 mmol/L (ref 98–111)
Creatinine, Ser: 0.5 mg/dL — ABNORMAL LOW (ref 0.61–1.24)
Glucose, Bld: 153 mg/dL — ABNORMAL HIGH (ref 70–99)
HCT: 30 % — ABNORMAL LOW (ref 39.0–52.0)
Hemoglobin: 10.2 g/dL — ABNORMAL LOW (ref 13.0–17.0)
Potassium: 4.4 mmol/L (ref 3.5–5.1)
Sodium: 135 mmol/L (ref 135–145)
TCO2: 22 mmol/L (ref 22–32)

## 2023-07-14 LAB — GLUCOSE, CAPILLARY
Glucose-Capillary: 213 mg/dL — ABNORMAL HIGH (ref 70–99)
Glucose-Capillary: 180 mg/dL — ABNORMAL HIGH (ref 70–99)
Glucose-Capillary: 259 mg/dL — ABNORMAL HIGH (ref 70–99)
Glucose-Capillary: 228 mg/dL — ABNORMAL HIGH (ref 70–99)

## 2023-07-14 MED ORDER — KETOROLAC TROMETHAMINE 15 MG/ML IJ SOLN
15.0000 mg | Freq: Four times a day (QID) | INTRAMUSCULAR | Status: AC
  Administered 2023-07-14 – 2023-07-15 (×5): 15 mg via INTRAVENOUS
  Filled 2023-07-14 (×5): qty 1

## 2023-07-14 MED ORDER — ACETAMINOPHEN 500 MG PO TABS
1000.0000 mg | ORAL_TABLET | Freq: Four times a day (QID) | ORAL | Status: DC
  Administered 2023-07-14 – 2023-07-19 (×17): 1000 mg via ORAL
  Filled 2023-07-14 (×19): qty 2

## 2023-07-14 MED ORDER — OXYCODONE HCL 5 MG PO TABS
5.0000 mg | ORAL_TABLET | ORAL | Status: DC | PRN
Start: 2023-07-14 — End: 2023-07-19
  Administered 2023-07-17 – 2023-07-18 (×2): 5 mg via ORAL
  Filled 2023-07-14 (×2): qty 1

## 2023-07-14 MED ORDER — ADULT MULTIVITAMIN W/MINERALS CH
1.0000 | ORAL_TABLET | Freq: Every day | ORAL | Status: DC
  Administered 2023-07-14 – 2023-07-19 (×6): 1 via ORAL
  Filled 2023-07-14 (×6): qty 1

## 2023-07-14 MED ORDER — OXYCODONE HCL 5 MG PO TABS: 5.0000 mg | ORAL_TABLET | ORAL | Status: DC | PRN

## 2023-07-14 MED ORDER — VITAMIN D 25 MCG (1000 UNIT) PO TABS
1000.0000 [IU] | ORAL_TABLET | Freq: Every day | ORAL | Status: DC
  Administered 2023-07-14 – 2023-07-19 (×6): 1000 [IU] via ORAL
  Filled 2023-07-14 (×6): qty 1

## 2023-07-14 MED ORDER — METHOCARBAMOL 500 MG PO TABS
500.0000 mg | ORAL_TABLET | Freq: Four times a day (QID) | ORAL | Status: DC
  Administered 2023-07-14 – 2023-07-19 (×19): 500 mg via ORAL
  Filled 2023-07-14 (×18): qty 1

## 2023-07-14 MED ORDER — OXYCODONE HCL 5 MG PO TABS: 5.0000 mg | ORAL_TABLET | ORAL | 0 refills | Status: DC | PRN

## 2023-07-14 MED ORDER — ASPIRIN 325 MG PO TABS: 325.0000 mg | ORAL_TABLET | Freq: Every day | ORAL | 0 refills | Status: DC

## 2023-07-14 MED ORDER — METHOCARBAMOL 500 MG PO TABS: 500.0000 mg | ORAL_TABLET | Freq: Four times a day (QID) | ORAL | 0 refills | Status: DC

## 2023-07-14 MED ORDER — METHOCARBAMOL 1000 MG/10ML IJ SOLN
500.0000 mg | Freq: Four times a day (QID) | INTRAMUSCULAR | Status: DC
  Filled 2023-07-14 (×2): qty 10

## 2023-07-14 MED ORDER — GERHARDT'S BUTT CREAM
TOPICAL_CREAM | Freq: Every day | CUTANEOUS | Status: DC
  Administered 2023-07-19: 1 via TOPICAL
  Filled 2023-07-14 (×2): qty 60

## 2023-07-14 NOTE — Progress Notes (Signed)
 Initial Nutrition Assessment  DOCUMENTATION CODES:   Severe malnutrition in context of chronic illness  INTERVENTION:  Liberalize diet to increase PO intake.   MVI  Magic cup TID with meals, each supplement provides 290 kcal and 9 grams of protein  3 snacks daily ordered.   Education provided to patient for increased nutrition needs.   NUTRITION DIAGNOSIS:   Severe Malnutrition related to chronic illness as evidenced by severe muscle depletion, moderate muscle depletion, moderate fat depletion, severe fat depletion, percent weight loss.  GOAL:   Patient will meet greater than or equal to 90% of their needs  MONITOR:   PO intake, Supplement acceptance  REASON FOR ASSESSMENT:   Consult Assessment of nutrition requirement/status  ASSESSMENT:   PMH of DM, basal cell carinoma, diabetic neuropathy, neuromuscular disorder, HLD, HTN presents from home with L hip fx. Pt with recent hospitalization in January for Whipple procedure. 4/3- arthroplasty, hip  Pt endorses weight loss since Whipple procedure in January but thinks he has gained 5# back prior to fall. Pt eats 3 meals, 3 snacks daily at rehab facility. RD went through snack options and pt would like pretzels, baked potato chips and vanilla yogurt as a snack daily. Pt reports disliking Ensure Enlive and prefers chocolate magic cups. Pt's wife is present in room and reports she is encouraging more carbohydrate options than she typically does at home due to increased nutrition needs and to increase caloric intake. RD agrees and will liberalize diet to Regular to increase po intake.    Medications reviewed and include: Colace, SSI 0-9 units with meal, SSI 0-5 units at HS, creon  Labs reviewed: Sodium 133 L, CBGs 113-232   Intake/Output Summary (Last 24 hours) at 07/14/2023 1204 Last data filed at 07/14/2023 0809 Gross per 24 hour  Intake 2250 ml  Output 1500 ml  Net 750 ml    Weights reviewed. Admit weight 68 kg. -12kg  (15%) wt loss in 3 months. Chart notes reveal UBW before Whipple to be 170# and hospitalization in January lead to 10+# wt loss  NUTRITION - FOCUSED PHYSICAL EXAM:  Flowsheet Row Most Recent Value  Orbital Region Moderate depletion  Upper Arm Region Severe depletion  Thoracic and Lumbar Region Moderate depletion  Buccal Region Moderate depletion  Temple Region Mild depletion  Clavicle Bone Region Moderate depletion  Clavicle and Acromion Bone Region Moderate depletion  Scapular Bone Region Severe depletion  Dorsal Hand Mild depletion  Patellar Region Moderate depletion  Anterior Thigh Region Severe depletion  Posterior Calf Region Moderate depletion  Edema (RD Assessment) None  Hair Reviewed  Eyes Reviewed  Mouth Reviewed  Skin Reviewed  Nails Reviewed       Diet Order:   Diet Order             Diet Carb Modified Fluid consistency: Thin; Room service appropriate? Yes  Diet effective now                   EDUCATION NEEDS:   Education needs have been addressed  Skin:  Skin Assessment: Reviewed RN Assessment (L hip incision)  Last BM:  4/2  Height:   Ht Readings from Last 1 Encounters:  07/13/23 (P) 5\' 11"  (1.803 m)    Weight:   Wt Readings from Last 1 Encounters:  07/13/23 (P) 68 kg    Ideal Body Weight:     BMI:  Body mass index is 20.92 kg/m (pended).  Estimated Nutritional Needs:   Kcal:  2000-2300  Protein:  100-120  Fluid:  >2L/day or per MD  Kathrynn Speed, MPH, RD, LDN Clinical Dietitian Contact information can be found at [facility].

## 2023-07-14 NOTE — Plan of Care (Signed)
   Problem: Coping: Goal: Ability to adjust to condition or change in health will improve Outcome: Progressing   Problem: Fluid Volume: Goal: Ability to maintain a balanced intake and output will improve Outcome: Progressing   Problem: Health Behavior/Discharge Planning: Goal: Ability to identify and utilize available resources and services will improve Outcome: Progressing   Problem: Nutritional: Goal: Maintenance of adequate nutrition will improve Outcome: Progressing   Problem: Skin Integrity: Goal: Risk for impaired skin integrity will decrease Outcome: Progressing

## 2023-07-14 NOTE — TOC Initial Note (Addendum)
 Transition of Care [facility]) - Initial/Assessment Note    Patient Details  Name: Andrew Tanner MRN: [id number] Date of Birth: [birth year]  Transition of Care [facility]) CM/SW Contact:    Lorri Frederick, LCSW Phone Number: 07/14/2023, 1:21 PM  Clinical Narrative:      Pt listed as oriented x3, still somewhat confused.  CSW spoke with pt wife [name] regarding PT recommendation for SNF.  Pt  had recent rehab stay at [facility], discussed copay days would likely be involved if admitted again.  [name] wanting to look at other options.  Pt currently staying at small residential program (3 residents) in [facility] called [facility]PT coming there to work with him.   [name] would like to see if PT could come every day.  Would also like to see if [facility] is an option for SNF.  Would consider [facility] again if neither of those options are available.    Referral sent to [facility], CSW reached out to [place] to review. Also sent message to [facility] requesting total SNF days from that admission.     1600: CSW spoke with [name].  She does want to move forward with return to [facility].  Contact is: Recruitment consultant, [phone].  Will need HH orders, possibly 3n1.      Expected Discharge Plan:  (SNF vs  back to residential program with PT) Barriers to Discharge: Continued Medical Work up   Patient Goals and CMS Choice Patient states their goals for this hospitalization and ongoing recovery are:: walk   Choice offered to / list presented to : Patient, Spouse      Expected Discharge Plan and Services In-house Referral: Clinical Social Work   Post Acute Care Choice:  (TBD) Living arrangements for the past 2 months: Group Home                                      Prior Living Arrangements/Services Living arrangements for the past 2 months: Group Home Lives with:: Facility Resident Patient language and need for interpreter reviewed:: Yes        Need for  Family Participation in Patient Care: Yes (Comment) Care giver support system in place?: Yes (comment) Current home services: Home PT Criminal Activity/Legal Involvement Pertinent to Current Situation/Hospitalization: No - Comment as needed  Activities of Daily Living   ADL Screening (condition at time of admission) Independently performs ADLs?: No Does the patient have a NEW difficulty with bathing/dressing/toileting/self-feeding that is expected to last >3 days?: No  Permission Sought/Granted                  Emotional Assessment Appearance:: Appears stated age Attitude/Demeanor/Rapport: Engaged Affect (typically observed): Pleasant Orientation: : Oriented to Self, Oriented to Place, Oriented to  Time      Admission diagnosis:  Fracture of femoral neck, left (HCC) [S72.002A] Closed displaced fracture of left femoral neck (HCC) [S72.002A] Patient Active Problem List   Diagnosis Date Noted   Fracture of femoral neck, left (HCC) 07/13/2023   Protein-calorie malnutrition, severe 05/17/2023   Vomiting and diarrhea 05/13/2023   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   Malnutrition of moderate degree 05/08/2023   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   Poorly controlled type 2 diabetes mellitus with circulatory disorder (HCC) 02/08/2023   Vitamin D deficiency 07/25/2022   Pain in both feet 02/10/2022  Peripheral neuropathy 01/25/2022   Seborrheic dermatitis 01/25/2022   Positive colorectal cancer screening using Cologuard test 03/16/2020   Chronic RLQ pain 03/16/2020   Type 2 diabetes mellitus with hyperglycemia, without long-term current use of insulin (HCC) 06/02/2011   Hypertension 06/02/2011   Hyperlipemia 06/02/2011   Cataract 06/02/2011   PCP:  Jeani Sow, MD Pharmacy:   [facility] [address] [place] - [address] [place] [address] Phone: [phone]  Fax: [phone]  [facility] [place] [facility] [address] [place] [address] Phone: [phone] Fax: [phone]  [facility] [address] [place] [address] Phone: [phone] Fax: [phone]     Social Drivers of Health (SDOH) Social History: SDOH Screenings   Food Insecurity: No Food Insecurity (07/13/2023)  Housing: Low Risk  (07/13/2023)  Transportation Needs: No Transportation Needs (07/13/2023)  Utilities: Not At Risk (07/13/2023)  Alcohol Screen: Low Risk  (10/19/2020)  Depression (PHQ2-9): Low Risk  (03/20/2023)  Financial Resource Strain: Low Risk  (03/16/2023)  Physical Activity: Sufficiently Active (03/16/2023)  Social Connections: Socially Integrated (07/13/2023)  Stress: No Stress Concern Present (03/16/2023)  Tobacco Use: Medium Risk (07/13/2023)  Health Literacy: Adequate Health Literacy (01/03/2023)   SDOH Interventions:     Readmission Risk Interventions    05/15/2023    8:24 AM  Readmission Risk Prevention Plan  Transportation Screening Complete  PCP or Specialist Appt within 3-5 Days Complete  HRI or Home Care Consult Complete

## 2023-07-14 NOTE — Progress Notes (Signed)
 PROGRESS NOTE    Andrew Tanner  WUJ:[id number] DOB: 08/28/1950 DOA: 07/12/2023 PCP: Jeani Sow, MD   Brief Narrative:  Andrew Tanner is a 73 y.o. male with medical history significant for type 2 diabetes, hyperlipidemia, moderate protein calorie malnutrition, who presents to the ER after mechanical fall -imaging confirms left femoral neck fracture, hospitalist called for admission.  Orthopedics called in consult.    Assessment & Plan:   Principal Problem:   Fracture of femoral neck, left (HCC)  Left femoral neck fracture, seen on x-ray, post mechanical fall, POA Pain currently well-controlled N.p.o. until seen by orthopedic surgery, Dr. Eulah Pont. Status post ORIF 07/13/2023 - tolerated well PT/OT to follow, appreciate recommendations   Type 2 diabetes with hyperglycemia Hemoglobin A1c 6.8 on 04/21/2023 Continue sliding scale insulin, hypoglycemic protocol Continue IV fluids until p.o. intake is appropriate   Moderate protein calorie malnutrition Albumin 2.4, moderate muscle mass loss Encourage oral protein calorie intake post operatively   Hyperlipidemia Continue statin   Elevated BP, likely contributed by pain No diagnosis of hypertension  Non anion gap metabolic acidosis, mild, resolved Resolved with IV fluids  DVT prophylaxis: SCDs Start: 07/13/23 1558 SCDs Start: 07/13/23 0018 Code Status:   Code Status: Full Code Family Communication: None present  Status is: Inpatient  Dispo: The patient is from: Group home              Anticipated d/c is to: To be determined, likely SNF              Anticipated d/c date is: 48 to 72 hours pending clinical course              Patient currently not medically stable for discharge  Consultants:  Orthopedic surgery  Procedures:  Pending above, planned ORIF, left femoral neck fracture  Antimicrobials:  Perioperatively  Subjective: No acute issues or events overnight, admits to ongoing stiffness and soreness but  improving from prior.  Otherwise denies nausea vomiting diarrhea constipation any fevers chills chest pain  Objective: Vitals:   07/13/23 1545 07/13/23 1559 07/13/23 1954 07/14/23 0427  BP: 103/76 105/69 136/78 (!) 140/75  Pulse: 100 100 (!) 111 86  Resp: 15 18 19 18   Temp:  98.1 F (36.7 C) 98.4 F (36.9 C) 98.3 F (36.8 C)  TempSrc:  Oral    SpO2: 97% 98% 99% 100%  Weight:      Height:        Intake/Output Summary (Last 24 hours) at 07/14/2023 0553 Last data filed at 07/13/2023 2208 Gross per 24 hour  Intake 2600 ml  Output 1250 ml  Net 1350 ml   Filed Weights   07/12/23 2107 07/13/23 0918  Weight: 68 kg (P) 68 kg    Examination:  General:  Pleasantly resting in bed, No acute distress. HEENT:  Normocephalic atraumatic.  Sclerae nonicteric, noninjected.  Extraocular movements intact bilaterally. Neck:  Without mass or deformity.  Trachea is midline. Lungs:  Clear to auscultate bilaterally without rhonchi, wheeze, or rales. Heart:  Regular rate and rhythm.  Without murmurs, rubs, or gallops. Abdomen:  Soft, nontender, nondistended.  Without guarding or rebound. Extremities: Without cyanosis, clubbing, edema, or obvious deformity. Skin:  Warm and dry, no erythema.   Data Reviewed: I have personally reviewed following labs and imaging studies  CBC: Recent Labs  Lab 07/12/23 2158 07/13/23 0625  WBC 9.7 8.4  HGB 11.6* 12.0*  HCT 35.0* 35.7*  MCV 86.4 86.4  PLT 227 196   Basic Metabolic Panel:  Recent Labs  Lab 07/12/23 2158 07/13/23 0625  NA 135 136  K 4.0 4.0  CL 109 106  CO2 19* 23  GLUCOSE 214* 144*  BUN 12 9  CREATININE 0.76 0.78  CALCIUM 9.1 9.2  MG  --  1.7  PHOS  --  3.0   GFR: Estimated Creatinine Clearance: 77.9 mL/min (by C-G formula based on SCr of 0.78 mg/dL).  CBG: Recent Labs  Lab 07/13/23 0752 07/13/23 0939 07/13/23 1428 07/13/23 1604 07/13/23 1956  GLUCAP 118* 113* 175* 180* 232*   Recent Results (from the past 240 hours)   Surgical pcr screen     Status: Abnormal   Collection Time: 07/13/23  6:39 AM   Specimen: Nasal Mucosa; Nasal Swab  Result Value Ref Range Status   MRSA, PCR NEGATIVE NEGATIVE Final   Staphylococcus aureus POSITIVE (A) NEGATIVE Final    Comment: (NOTE) The Xpert SA Assay (FDA approved for NASAL specimens in patients 35 years of age and older), is one component of a comprehensive surveillance program. It is not intended to diagnose infection nor to guide or monitor treatment. Performed at [facility], [address] [place], [place] [address]      Radiology Studies: DG HIP PORT UNILAT W OR W/O PELVIS 1V LEFT Result Date: 07/13/2023 CLINICAL DATA:  Postop in PACU. EXAM: DG HIP (WITH OR WITHOUT PELVIS) 1V PORT LEFT COMPARISON:  Preoperative imaging FINDINGS: Left hip arthroplasty in expected alignment. No periprosthetic lucency or fracture. Recent postsurgical change includes air and edema in the soft tissues. IMPRESSION: Left hip arthroplasty without immediate postoperative complication. Electronically Signed   By: Narda Rutherford M.D.   On: 07/13/2023 16:27   DG HIP UNILAT WITH PELVIS 1V LEFT Result Date: 07/13/2023 CLINICAL DATA:  Elective surgery EXAM: DG HIP (WITH OR WITHOUT PELVIS) 1V*L* COMPARISON:  Preoperative imaging FINDINGS: Seven fluoroscopic spot views of the pelvis and left hip obtained in the operating room. Sequential images during hip arthroplasty. Fluoroscopy time 55 seconds. Dose 5.404 mGy. IMPRESSION: Intraoperative fluoroscopy during left hip arthroplasty. Electronically Signed   By: Narda Rutherford M.D.   On: 07/13/2023 16:21   DG C-Arm 1-60 Min-No Report Result Date: 07/13/2023 Fluoroscopy was utilized by the requesting physician.  No radiographic interpretation.   DG C-Arm 1-60 Min-No Report Result Date: 07/13/2023 Fluoroscopy was utilized by the requesting physician.  No radiographic interpretation.   DG C-Arm 1-60 Min-No Report Result Date:  07/13/2023 Fluoroscopy was utilized by the requesting physician.  No radiographic interpretation.   DG C-Arm 1-60 Min-No Report Result Date: 07/13/2023 Fluoroscopy was utilized by the requesting physician.  No radiographic interpretation.   CT HIP LEFT WO CONTRAST Result Date: 07/13/2023 CLINICAL DATA:  Left hip fracture. EXAM: CT OF THE LEFT HIP WITHOUT CONTRAST TECHNIQUE: Multidetector CT imaging of the left hip was performed according to the standard protocol. Multiplanar CT image reconstructions were also generated. RADIATION DOSE REDUCTION: This exam was performed according to the departmental dose-optimization program which includes automated exposure control, adjustment of the mA and/or kV according to patient size and/or use of iterative reconstruction technique. COMPARISON:  AP pelvis and left hip films yesterday, CT abdomen pelvis with contrast 05/30/2023. FINDINGS: Bones/Joint/Cartilage There is mild osteopenia. There is an acute basicervical transverse oblique distal left femoral neck fracture, with comminution and mild impaction at the fracture site, and about 1 cortex width of posterior and lateral displacement of the main distal fragment as well as mild external rotation of the distal fragment. There  is no further evidence fractures. There is mild narrowing and spurring at the hip joint, pubic symphysis and left SI joint. Degenerative disc disease and spondylosis L5-S1. Ligaments Suboptimally assessed by CT. Muscles and Tendons There is swelling in the lateral aspect of the left gluteus maximus muscle most likely a due to direct trauma. There is patchy fluid in between muscle groups in the visualized anterior proximal thigh. No space-occupying intramuscular hematoma is seen. There is a grossly normal muscle bulk for age. No acute tendon findings. Soft tissues Partial visualization of enlarged prostate impressing into the bladder base. Sigmoid diverticulosis is noted without evidence of  diverticulitis. In the left pelvis no free fluid, free air or free hemorrhage is seen. There is no space-occupying hematoma in the visualized upper thigh. No mass. IMPRESSION: 1. Acute closed basicervical transverse oblique distal left femoral neck fracture with comminution and mild impaction at the fracture site, and about 1 cortex width of posterior and lateral displacement of the main distal fragment as well as mild external rotation of the distal fragment. 2. Osteopenia and degenerative change. 3. Swelling in the lateral aspect of the left gluteus maximus muscle most likely due to direct trauma. 4. Patchy fluid in between muscle groups in the visualized anterior proximal thigh. No space-occupying hematoma is seen. 5. Enlarged prostate impressing into the bladder base. 6. Sigmoid diverticulosis. Electronically Signed   By: Almira Bar M.D.   On: 07/13/2023 06:43   DG Hip Unilat With Pelvis 2-3 Views Left Result Date: 07/12/2023 CLINICAL DATA:  Fall, left hip pain EXAM: DG HIP (WITH OR WITHOUT PELVIS) 2-3V LEFT COMPARISON:  None Available. FINDINGS: There is a left femoral neck fracture. No significant angulation. No subluxation or dislocation. Hip joints and SI joints symmetric. IMPRESSION: Left femoral neck fracture. Electronically Signed   By: Charlett Nose M.D.   On: 07/12/2023 22:02    Scheduled Meds:  aspirin  325 mg Oral Daily   docusate sodium  100 mg Oral BID   insulin aspart  0-5 Units Subcutaneous QHS   insulin aspart  0-9 Units Subcutaneous TID WC   rosuvastatin  20 mg Oral Daily   Continuous Infusions:   ceFAZolin (ANCEF) IV 2 g (07/14/23 0549)   lactated ringers 50 mL/hr at 07/13/23 1058     LOS: 1 day   Time spent:  Azucena Fallen, DO [facility]  If 7PM-7AM, please contact night-coverage [email or web address]  07/14/2023, 5:53 AM

## 2023-07-14 NOTE — NC FL2 (Signed)
 [employer] MEDICAID FL2 LEVEL OF CARE FORM     IDENTIFICATION  Patient Name: Andrew Tanner Birthdate: 1950-10-10 Sex: male Admission Date (Current Location): 07/12/2023  [facility] and [place] Number:  Producer, television/film/video and Address:  [facility], 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   Malnutrition of moderate degree 05/08/2023   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   Poorly controlled type 2 diabetes mellitus with circulatory disorder (HCC) 02/08/2023   Vitamin D deficiency 07/25/2022   Pain in both feet 02/10/2022   Peripheral neuropathy 01/25/2022   Seborrheic dermatitis 01/25/2022   Positive colorectal cancer screening using Cologuard test 03/16/2020   Chronic RLQ pain 03/16/2020   Type 2 diabetes mellitus with hyperglycemia, without long-term current use of insulin (HCC) 06/02/2011   Hypertension 06/02/2011   Hyperlipemia 06/02/2011   Cataract 06/02/2011    Orientation RESPIRATION BLADDER Height & Weight     Self, Time, Place  Normal Continent Weight: (P) 150 lb (68 kg) Height:  (P) 5\' 11"  (180.3 cm)  BEHAVIORAL SYMPTOMS/MOOD NEUROLOGICAL BOWEL NUTRITION STATUS      Continent Diet (see discharge summary)  AMBULATORY STATUS COMMUNICATION OF NEEDS Skin   Limited Assist    Surgical wounds                       Personal Care Assistance Level of Assistance  Bathing, Feeding, Dressing Bathing Assistance: Limited assistance Feeding assistance: Limited assistance Dressing Assistance: Limited assistance     Functional Limitations Info  Sight, Hearing, Speech Sight Info: Adequate Hearing Info: Adequate Speech Info: Adequate    SPECIAL CARE FACTORS FREQUENCY  PT (By licensed PT), OT (By licensed OT)     PT Frequency: 5x week OT Frequency: 5x week            Contractures Contractures Info: Not present    Additional Factors Info  Code Status, Allergies, Insulin Sliding Scale Code Status Info: full Allergies Info: NKA   Insulin Sliding Scale Info: Novolog: see discharge summary       Current Medications (07/14/2023):  This is the current hospital active medication list Current Facility-Administered Medications  Medication Dose Route Frequency Provider Last Rate Last Admin   acetaminophen (TYLENOL) tablet 1,000 mg  1,000 mg Oral Q6H McClung, Sarah A, PA-C       aspirin tablet 325 mg  325 mg Oral Daily West Bali, PA-C   325 mg at 07/14/23 6644   cholecalciferol (VITAMIN D3) 25 MCG (1000 UNIT) tablet 1,000 Units  1,000 Units Oral Daily [place] Bali, PA-C   1,000 Units at 07/14/23 1223   diphenhydrAMINE (BENADRYL) 12.5 MG/5ML elixir 12.5-25 mg  12.5-25 mg Oral Q4H PRN [place] Bali, PA-C       docusate sodium (COLACE) capsule 100 mg  100 mg Oral BID McClung,  Sarah A, PA-C   100 mg at 07/14/23 0818   hydrALAZINE (APRESOLINE) tablet 10 mg  10 mg Oral Q6H PRN [place] Bali, PA-C       HYDROmorphone (DILAUDID) injection 0.5 mg  0.5 mg Intravenous Q4H PRN West Bali, PA-C       insulin aspart (novoLOG) injection 0-5 Units  0-5 Units Subcutaneous QHS West Bali, PA-C   2 Units at 07/13/23 2128   insulin aspart (novoLOG) injection 0-9 Units  0-9 Units Subcutaneous TID WC West Bali, PA-C   2 Units at 07/14/23 1226    ketorolac (TORADOL) 15 MG/ML injection 15 mg  15 mg Intravenous Q6H West Bali, PA-C   15 mg at 07/14/23 1224   lactated ringers infusion   Intravenous Continuous [place] B[place], PA-C 50 mL/hr at 07/14/23 1610 New Bag at 07/14/23 0814   melatonin tablet 5 mg  5 mg Oral QHS PRN [place] Bali, PA-C       methocarbamol (ROBAXIN) tablet 500 mg  500 mg Oral QID McClung, Sarah A, PA-C       Or   methocarbamol (ROBAXIN) injection 500 mg  500 mg Intravenous QID McClung, Sarah A, PA-C       multivitamin with minerals tablet 1 tablet  1 tablet Oral Daily Azucena Fallen, MD       oxyCODONE (Oxy IR/ROXICODONE) immediate release tablet 5-10 mg  5-10 mg Oral Q4H PRN McClung, Sarah A, PA-C       polyethylene glycol (MIRALAX / GLYCOLAX) packet 17 g  17 g Oral Daily PRN Sharon Seller, Sarah A, PA-C       prochlorperazine (COMPAZINE) injection 5 mg  5 mg Intravenous Q6H PRN McClung, Sarah A, PA-C       rosuvastatin (CRESTOR) tablet 20 mg  20 mg Oral Daily West Bali, PA-C   20 mg at 07/14/23 9604     Discharge Medications: Please see discharge summary for a list of discharge medications.  Relevant Imaging Results:  Relevant Lab Results:   Additional Information SSN: [id number]  Lorri Frederick, LCSW

## 2023-07-14 NOTE — Progress Notes (Signed)
 Orthopaedic Trauma Progress Note  SUBJECTIVE: Doing ok today. Just worked with OT, was able to stand put couldn't take any steps. Notes moderate to severe pain through the left hip that radiates down into the thigh. I have scheduled patient's Tylenol and muscle relaxer. I have also added short course of Toradol for better baseline pain control. No chest pain. No SOB. No nausea/vomiting. Patient notes a burning sensation when he urinates. Nurse also notes he is making small amount of urine. He does have a history of interstitial cystitis. I have ordered a UA for this morning. No other complaints.   OBJECTIVE:  Vitals:   07/14/23 0427 07/14/23 0725  BP: (!) 140/75 (!) 140/78  Pulse: 86 91  Resp: 18 18  Temp: 98.3 F (36.8 C) 98.1 F (36.7 C)  SpO2: 100% 100%    General: Sitting up in bed, NAD Respiratory: No increased work of breathing.  LLE: Aquacel dressing in place. Tender over the hip and throughout the thigh as expected. Tolerates gentle knee motion. Ankle DF/PF intact. Endorses sensation over all aspects of the foot. +EHL/FHL. +DP pulse  IMAGING: Stable post op imaging.   LABS:  Results for orders placed or performed during the hospital encounter of 07/12/23 (from the past 24 hours)  Glucose, capillary     Status: Abnormal   Collection Time: 07/13/23  9:39 AM  Result Value Ref Range   Glucose-Capillary 113 (H) 70 - 99 mg/dL  I-STAT, chem 8     Status: Abnormal   Collection Time: 07/13/23  1:30 PM  Result Value Ref Range   Sodium 135 135 - 145 mmol/L   Potassium 4.4 3.5 - 5.1 mmol/L   Chloride 104 98 - 111 mmol/L   BUN 8 8 - 23 mg/dL   Creatinine, Ser 1.30 (L) 0.61 - 1.24 mg/dL   Glucose, Bld 865 (H) 70 - 99 mg/dL   Calcium, Ion 7.84 6.96 - 1.40 mmol/L   TCO2 22 22 - 32 mmol/L   Hemoglobin 10.2 (L) 13.0 - 17.0 g/dL   HCT 29.5 (L) 28.4 - 13.2 %  Glucose, capillary     Status: Abnormal   Collection Time: 07/13/23  2:28 PM  Result Value Ref Range   Glucose-Capillary 175  (H) 70 - 99 mg/dL  Glucose, capillary     Status: Abnormal   Collection Time: 07/13/23  4:04 PM  Result Value Ref Range   Glucose-Capillary 180 (H) 70 - 99 mg/dL  VITAMIN D 25 Hydroxy (Vit-D Deficiency, Fractures)     Status: None   Collection Time: 07/13/23  4:16 PM  Result Value Ref Range   Vit D, 25-Hydroxy 34.65 30 - 100 ng/mL  Glucose, capillary     Status: Abnormal   Collection Time: 07/13/23  7:56 PM  Result Value Ref Range   Glucose-Capillary 232 (H) 70 - 99 mg/dL  CBC     Status: Abnormal   Collection Time: 07/14/23  5:56 AM  Result Value Ref Range   WBC 9.2 4.0 - 10.5 K/uL   RBC 2.85 (L) 4.22 - 5.81 MIL/uL   Hemoglobin 8.3 (L) 13.0 - 17.0 g/dL   HCT 44.0 (L) 10.2 - 72.5 %   MCV 86.7 80.0 - 100.0 fL   MCH 29.1 26.0 - 34.0 pg   MCHC 33.6 30.0 - 36.0 g/dL   RDW 36.6 44.0 - 34.7 %   Platelets 154 150 - 400 K/uL   nRBC 0.0 0.0 - 0.2 %  Basic metabolic panel with GFR  Status: Abnormal   Collection Time: 07/14/23  5:56 AM  Result Value Ref Range   Sodium 133 (L) 135 - 145 mmol/L   Potassium 4.6 3.5 - 5.1 mmol/L   Chloride 106 98 - 111 mmol/L   CO2 20 (L) 22 - 32 mmol/L   Glucose, Bld 213 (H) 70 - 99 mg/dL   BUN 11 8 - 23 mg/dL   Creatinine, Ser 2.13 0.61 - 1.24 mg/dL   Calcium 9.0 8.9 - 08.6 mg/dL   GFR, Estimated >57 >84 mL/min   Anion gap 7 5 - 15  Glucose, capillary     Status: Abnormal   Collection Time: 07/14/23  7:56 AM  Result Value Ref Range   Glucose-Capillary 213 (H) 70 - 99 mg/dL    ASSESSMENT: Andrew Tanner is a 73 y.o. male, 1 Day Post-Op s/p TOTAL HIP ARTHROPLASTY FOR FRACTURE, ANTERIOR APPROACH  CV/Blood loss: Acute blood loss anemia, Hgb 8.3 this AM. Hemodynamically stable  PLAN: Weightbearing: WBAT LLE ROM: No posterior hip precautions  Incisional and dressing care: Dressings left intact until follow-up  Showering:  ok to shower with Aquacel in place Orthopedic device(s): None  Pain management:  1. Tylenol 1000 mg q 6 hours  scheduled 2. Robaxin 500 mg QID 3. Oxycodone 5 mg q 4 hours PRN 4. Dilaudid 0.5 mg q 4 hours PRN VTE prophylaxis: Aspirin, SCDs ID:  Ancef 2gm post op Foley/Lines:  No foley, KVO IVFs Medical co-morbidities: Diabetes. Vit D level low normal at 34, have started supplementation Dispo: PT/OT eval today, OT currently recommending SNF.  Okay for discharge from ortho standpoint once cleared by medicine team and therapies  D/C recommendations: - Oxycodone for pain control - ASA for DVT prophylaxis - Continue 1000 units Vit D supplementation daily x 90 days  Follow - up plan: 2 weeks after d.c for wound check and repeat x-rays   Contact information:  Truitt Merle MD, Thyra Breed PA-C. After hours and holidays please check [email or web address] for group call information for [facility]   Thompson Caul, PA-C [phone] (office) [email or web address]

## 2023-07-14 NOTE — Evaluation (Signed)
 Physical Therapy Evaluation Patient Details Name: Andrew Tanner MRN: [id number] DOB: 11-23-1950 Today's Date: 07/14/2023  History of Present Illness  Pt is a 73 y.o. male admitted via EMS 07/12/23 for fall at rehab facility. X-ray showed L femoral neck fx. 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 Prior to admission the pt was ambulating with use of RW at rehab, reports recent progress with endurance but is working with PT there on continued balance issues. The pt now presents with deficits in functional mobility, strength, ROM, seated and standing balance, and power due to above dx, and will continue to benefit from skilled PT to address these deficits. The pt needed min-modA to complete bed mobility,and modA to complete sit-stand transfers at EOB due to poor functional strength and posterior lean with all upright mobility. Pt was able to manage small lateral steps along EOB as well as second standing effort with standing marches this session. Recommend continued skilled PT acutely as well as return to post-acute rehab <3hours/day to facilitate return to independence with ambulation.         If plan is discharge home, recommend the following: A lot of help with walking and/or transfers;A lot of help with bathing/dressing/bathroom;Assistance with cooking/housework;Direct supervision/assist for medications management;Direct supervision/assist for financial management;Assist for transportation;Help with stairs or ramp for entrance;Supervision due to cognitive status   Can travel by private vehicle   Yes    Equipment Recommendations None recommended by PT  Recommendations for Other Services       Functional Status Assessment Patient has had  a recent decline in their functional status and demonstrates the ability to make significant improvements in function in a reasonable and predictable amount of time.     Precautions / Restrictions Precautions Precautions: Fall Recall of Precautions/Restrictions: Impaired Restrictions Weight Bearing Restrictions Per Provider Order: Yes LLE Weight Bearing Per Provider Order: Weight bearing as tolerated      Mobility  Bed Mobility Overal bed mobility: Needs Assistance Bed Mobility: Supine to Sit     Supine to sit: Min assist, HOB elevated, Used rails     General bed mobility comments: minA to manage LLE, pt able to elevate trunk with use of rails.posterior LOB in sitting    Transfers Overall transfer level: Needs assistance Equipment used: Rolling walker (2 wheels) Transfers: Sit to/from Stand, Bed to chair/wheelchair/BSC Sit to Stand: Mod assist, From elevated surface, Min assist   Step pivot transfers: Min assist       General transfer comment: modA for initial sit-stand, cues for anterior wt shift and use of UE. pt with posterior lean in standing, continues to need modA. minA for seconds stand but progressive posterior lean eventually needing modA to maintain and safely return to sitting    Ambulation/Gait Ambulation/Gait assistance: Mod assist Gait Distance (Feet): 3 Feet Assistive device: Rolling walker (2 wheels) Gait Pattern/deviations: Step-to pattern, Decreased stride length, Decreased stance time - left, Decreased weight shift to left Gait velocity: decreased Gait velocity interpretation: <1.31 ft/sec, indicative of household ambulator   General Gait Details: small lateral steps with minimal clearance,pt with posterior lean      Balance Overall balance assessment: Needs assistance Sitting-balance support: Feet supported, Bilateral upper extremity supported Sitting balance-Leahy Scale: Fair Sitting balance - Comments: Inital min assist to steady,  progressed  to CGA. single LOB with LE movements Postural control: Posterior lean Standing balance support: Bilateral upper extremity supported, During functional activity, Reliant on assistive device for balance Standing balance-Leahy Scale: Poor Standing balance comment: Reliant on RW, progressive posterior lean with standing, needs cues to correct                             Pertinent Vitals/Pain Pain Assessment Pain Assessment: Faces Pain Score: 8  Pain Location: L Hip Pain Descriptors / Indicators: Sharp, Stabbing, Discomfort, Sore, Burning Pain Intervention(s): Limited activity within patient's tolerance, Monitored during session, Repositioned, Ice applied    Home Living Family/patient expects to be discharged to:: Private residence Living Arrangements: Spouse/significant other Available Help at Discharge: Family;Available PRN/intermittently Type of Home: House Home Access: Stairs to enter   Entrance Stairs-Number of Steps: 4 Alternate Level Stairs-Number of Steps: flight Home Layout: Two level;Bed/bath upstairs Home Equipment: Pharmacist, hospital (2 wheels) Additional Comments: Pt was at [facility] receivng rehab when fell    Prior Function Prior Level of Function : Independent/Modified Independent;Working/employed;Driving             Mobility Comments: Per pt progressing well at rehab with RW ADLs Comments: Per pt able to complete ADL mod I     Extremity/Trunk Assessment   Upper Extremity Assessment Upper Extremity Assessment: Defer to OT evaluation RUE Deficits / Details: PMH: neuromuscular disorder, tremors RUE Sensation: history of peripheral neuropathy RUE Coordination: decreased fine motor LUE Deficits / Details: PMH: neuromuscular disorder, tremors LUE Sensation: history of peripheral neuropathy LUE Coordination: decreased fine motor    Lower Extremity Assessment Lower Extremity Assessment: Generalized weakness;RLE deficits/detail;LLE  deficits/detail RLE Deficits / Details: grossly 4/5 to MMT, reports sensation intact, good ROM but slightly impaired coordination RLE Sensation: WNL RLE Coordination: decreased fine motor LLE Deficits / Details: grossly 3/5 due to painin hip, reports burning L thigh. reports sensation intact LLE: Unable to fully assess due to pain LLE Sensation: WNL LLE Coordination: decreased fine motor    Cervical / Trunk Assessment Cervical / Trunk Assessment: Kyphotic  Communication   Communication Communication: No apparent difficulties    Cognition Arousal: Alert Behavior During Therapy: Flat affect   PT - Cognitive impairments: No family/caregiver present to determine baseline                       PT - Cognition Comments: pt needing increased cues for sequencing and to remain attention on task Following commands: Impaired Following commands impaired: Follows one step commands with increased time     Cueing Cueing Techniques: Verbal cues, Tactile cues, Visual cues     General Comments General comments (skin integrity, edema, etc.): VSS on RA    Exercises General Exercises - Lower Extremity Ankle Circles/Pumps: AROM, Both, 15 reps Quad Sets: AROM, Both, 10 reps Long Arc Quad: AROM, Right, 10 reps Heel Raises: AROM, Both, 15 reps, Seated Other Exercises Other Exercises: standing marches x10   Assessment/Plan    PT Assessment Patient needs continued PT services  PT Problem List Decreased strength;Decreased range of motion;Decreased activity tolerance;Decreased balance;Decreased mobility;Decreased coordination;Decreased safety awareness;Pain       PT Treatment Interventions DME instruction;Gait training;Stair training;Functional mobility training;Therapeutic activities;Therapeutic exercise;Balance training;Patient/family education    PT Goals (Current goals can be found in the Care Plan section)  Acute Rehab PT Goals Patient Stated Goal: return to walking PT Goal  Formulation: With patient Time For Goal  Achievement: 07/28/23 Potential to Achieve Goals: Good    Frequency Min 2X/week        AM-PAC PT "6 Clicks" Mobility  Outcome Measure Help needed turning from your back to your side while in a flat bed without using bedrails?: A Little Help needed moving from lying on your back to sitting on the side of a flat bed without using bedrails?: A Lot Help needed moving to and from a bed to a chair (including a wheelchair)?: A Lot Help needed standing up from a chair using your arms (e.g., wheelchair or bedside chair)?: A Lot Help needed to walk in hospital room?: Total Help needed climbing 3-5 steps with a railing? : Total 6 Click Score: 11    End of Session Equipment Utilized During Treatment: Gait belt Activity Tolerance: Patient tolerated treatment well Patient left: in bed;with call bell/phone within reach;with chair alarm set Nurse Communication: Mobility status PT Visit Diagnosis: Unsteadiness on feet (R26.81);Muscle weakness (generalized) (M62.81);Pain Pain - Right/Left: Left Pain - part of body: Hip    Time: 8295-6213 PT Time Calculation (min) (ACUTE ONLY): 27 min   Charges:   PT Evaluation $PT Eval Moderate Complexity: 1 Mod PT Treatments $Therapeutic Activity: 8-22 mins           Vickki Muff, PT, DPT   Acute Rehabilitation Department Office [phone] Secure Chat Communication Preferred  Ronnie Derby 07/14/2023, 10:58 AM

## 2023-07-14 NOTE — Discharge Instructions (Signed)
 Orthopaedic Trauma Service Discharge Instructions   General Discharge Instructions  WEIGHT BEARING STATUS:Weightbearing as tolerated  RANGE OF MOTION/ACTIVITY: ok for hip motion as tolerated  Wound Care: Leave your aquacel dressing in place until follow-up. Dressing may get wet when showering.  DVT/PE prophylaxis: Aspirin x 6 weeks  Diet: as you were eating previously.  Can use over the counter stool softeners and bowel preparations, such as Miralax, to help with bowel movements.  Narcotics can be constipating.  Be sure to drink plenty of fluids  PAIN MEDICATION USE AND EXPECTATIONS  You have likely been given narcotic medications to help control your pain.  After a traumatic event that results in an fracture (broken bone) with or without surgery, it is ok to use narcotic pain medications to help control one's pain.  We understand that everyone responds to pain differently and each individual patient will be evaluated on a regular basis for the continued need for narcotic medications. Ideally, narcotic medication use should last no more than 6-8 weeks (coinciding with fracture healing).   As a patient it is your responsibility as well to monitor narcotic medication use and report the amount and frequency you use these medications when you come to your office visit.   We would also advise that if you are using narcotic medications, you should take a dose prior to therapy to maximize you participation.  IF YOU ARE ON NARCOTIC MEDICATIONS IT IS NOT PERMISSIBLE TO OPERATE A MOTOR VEHICLE (MOTORCYCLE/CAR/TRUCK/MOPED) OR HEAVY MACHINERY DO NOT MIX NARCOTICS WITH OTHER CNS (CENTRAL NERVOUS SYSTEM) DEPRESSANTS SUCH AS ALCOHOL   STOP SMOKING OR USING NICOTINE PRODUCTS!!!!  As discussed nicotine severely impairs your body's ability to heal surgical and traumatic wounds but also impairs bone healing.  Wounds and bone heal by forming microscopic blood vessels (angiogenesis) and nicotine is a  vasoconstrictor (essentially, shrinks blood vessels).  Therefore, if vasoconstriction occurs to these microscopic blood vessels they essentially disappear and are unable to deliver necessary nutrients to the healing tissue.  This is one modifiable factor that you can do to dramatically increase your chances of healing your injury.    (This means no smoking, no nicotine gum, patches, etc)  ICE AND ELEVATE INJURED/OPERATIVE EXTREMITY  Using ice and elevating the injured extremity above your heart can help with swelling and pain control.  Icing in a pulsatile fashion, such as 20 minutes on and 20 minutes off, can be followed.    Do not place ice directly on skin. Make sure there is a barrier between to skin and the ice pack.    Using frozen items such as frozen peas works well as the conform nicely to the are that needs to be iced.  USE AN ACE WRAP OR TED HOSE FOR SWELLING CONTROL  In addition to icing and elevation, Ace wraps or TED hose are used to help limit and resolve swelling.  It is recommended to use Ace wraps or TED hose until you are informed to stop.    When using Ace Wraps start the wrapping distally (farthest away from the body) and wrap proximally (closer to the body)   Example: If you had surgery on your leg or thing and you do not have a splint on, start the ace wrap at the toes and work your way up to the thigh        If you had surgery on your upper extremity and do not have a splint on, start the ace wrap at your fingers  and work your way up to the upper arm  CALL THE OFFICE WITH ANY QUESTIONS OR CONCERNS: [phone]   VISIT OUR WEBSITE FOR ADDITIONAL INFORMATION: [email or web address]   Discharge Wound Care Instructions  Do NOT apply any ointments, solutions or lotions to pin sites or surgical wounds.  These prevent needed drainage and even though solutions like hydrogen peroxide kill bacteria, they also damage cells lining the pin sites that help fight infection.  Applying  lotions or ointments can keep the wounds moist and can cause them to breakdown and open up as well. This can increase the risk for infection. When in doubt call the office.  If surgical dressing comes off or get water underneath the seal, remove Aquacel dressing and apply clean mepilex

## 2023-07-14 NOTE — Evaluation (Signed)
 Occupational Therapy Evaluation Patient Details Name: Andrew Tanner MRN: [id number] DOB: 06-Apr-1951 Today's Date: 07/14/2023   History of Present Illness   Pt is a 73 y.o. male admitted via EMS 07/12/23 for fall at rehab facility. X-ray showed L femoral neck fx. 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 PTA pt was completing rehab at a skilled facility and was independent/mod I with ADLs and progressing well with functional mobility. Today pt is requiring set up  to mod assist for ADLs. Bed mobility was min to mod assist and STS was mod. Unable to progress to functional transfer d/t pain and decreased balance. Recommendation of <3 hours of skilled rehab daily to optimize independence levels. OT will continue to follow acutely to maximize functional independence.        If plan is discharge home, recommend the following:   A lot of help with walking and/or transfers;A lot of help with bathing/dressing/bathroom     Functional Status Assessment   Patient has had a recent decline in their functional status and demonstrates the ability to make significant improvements in function in a reasonable and predictable amount of time.     Equipment Recommendations   Other (comment) (Defer to next venue)     Recommendations for Other Services         Precautions/Restrictions   Precautions Precautions: Fall Recall of Precautions/Restrictions: Impaired Restrictions Weight Bearing Restrictions Per Provider Order: Yes LLE Weight Bearing Per Provider Order: Weight bearing as tolerated     Mobility Bed Mobility Overal bed mobility: Needs Assistance Bed Mobility: Supine to Sit, Sit to Supine     Supine to sit: Min  assist, HOB elevated, Used rails Sit to supine: Mod assist, HOB elevated, Used rails   General bed mobility comments: Cues for sequencing, assist for LLE    Transfers Overall transfer level: Needs assistance Equipment used: Rolling walker (2 wheels) Transfers: Sit to/from Stand Sit to Stand: Mod assist, From elevated surface           General transfer comment: Mod assist for STS, pt able to take side step towards [facility] with RW min assist      Balance Overall balance assessment: Needs assistance Sitting-balance support: Feet supported, Bilateral upper extremity supported Sitting balance-Leahy Scale: Fair Sitting balance - Comments: Inital min assist to steady, progressed to CGA Postural control: Posterior lean Standing balance support: Bilateral upper extremity supported, During functional activity, Reliant on assistive device for balance Standing balance-Leahy Scale: Poor Standing balance comment: Reliant on RW           ADL either performed or assessed with clinical judgement   ADL Overall ADL's : Needs assistance/impaired Eating/Feeding: Set up;Sitting   Grooming: Set up;Sitting   Upper Body Bathing: Set up;Sitting   Lower Body Bathing: Moderate assistance;Sitting/lateral leans   Upper Body Dressing : Set up;Sitting   Lower Body Dressing: Moderate assistance;Sit to/from stand Lower Body Dressing Details (indicate cue type and reason): Pt able to cross legs to don R sock, assist for L sock. Would be mod for pants               General ADL Comments: Pt limited by pain, declined SPT d/t pain and fearfulness     Vision Baseline Vision/History: 1 Wears glasses Vision Assessment?:  No apparent visual deficits            Pertinent Vitals/Pain Pain Assessment Pain Assessment: Faces Pain Score: 7  Pain Location: L Hip Pain Descriptors / Indicators: Sharp, Stabbing, Discomfort, Sore Pain Intervention(s): Limited activity within patient's tolerance, Patient  requesting pain meds-RN notified, Repositioned     Extremity/Trunk Assessment Upper Extremity Assessment Upper Extremity Assessment: Overall WFL for tasks assessed;RUE deficits/detail;LUE deficits/detail RUE Deficits / Details: PMH: neuromuscular disorder, tremors RUE Sensation: history of peripheral neuropathy RUE Coordination: decreased fine motor LUE Deficits / Details: PMH: neuromuscular disorder, tremors LUE Sensation: history of peripheral neuropathy LUE Coordination: decreased fine motor   Lower Extremity Assessment Lower Extremity Assessment: Defer to PT evaluation       Communication Communication Communication: No apparent difficulties   Cognition Arousal: Alert Behavior During Therapy: Flat affect Cognition: No apparent impairments             OT - Cognition Comments: Per RN pt confused and disoriented, during session pt A & O, does not appear impulsive. Delayed processing speed                 Following commands: Impaired Following commands impaired: Follows one step commands with increased time     Cueing  General Comments   Cueing Techniques: Verbal cues;Tactile cues;Visual cues  VSS on RA           Home Living Family/patient expects to be discharged to:: Private residence Living Arrangements: Spouse/significant other Available Help at Discharge: Family;Available PRN/intermittently Type of Home: House Home Access: Stairs to enter Entrance Stairs-Number of Steps: 4   Home Layout: Two level;Bed/bath upstairs Alternate Level Stairs-Number of Steps: flight Alternate Level Stairs-Rails: Right;Left Bathroom Shower/Tub: Walk-in shower;Tub only   Bathroom Toilet: Handicapped height Bathroom Accessibility: Yes How Accessible: Accessible via walker Home Equipment: Shower seat;Rolling Walker (2 wheels)   Additional Comments: Pt was at [facility] receivng rehab when fell      Prior Functioning/Environment Prior Level of Function :  Independent/Modified Independent;Working/employed;Driving             Mobility Comments: Per pt progressing well at rehab with RW ADLs Comments: Per pt able to complete ADL mod I    OT Problem List: Decreased strength;Decreased range of motion;Impaired balance (sitting and/or standing);Decreased activity tolerance;Decreased safety awareness;Decreased knowledge of use of DME or AE   OT Treatment/Interventions: Self-care/ADL training;Therapeutic exercise;DME and/or AE instruction;Therapeutic activities;Patient/family education;Balance training;Energy conservation      OT Goals(Current goals can be found in the care plan section)   Acute Rehab OT Goals Patient Stated Goal: To go back to rehab OT Goal Formulation: With patient Time For Goal Achievement: 07/28/23 Potential to Achieve Goals: Good   OT Frequency:  Min 2X/week       AM-PAC OT "6 Clicks" Daily Activity     Outcome Measure Help from another person eating meals?: None Help from another person taking care of personal grooming?: A Little Help from another person toileting, which includes using toliet, bedpan, or urinal?: A Lot Help from another person bathing (including washing, rinsing, drying)?: A Lot Help from another person to put on and taking off regular upper body clothing?: A Little Help from another person to put on and taking off regular lower body clothing?: A Lot 6 Click Score: 16   End of Session Equipment Utilized During Treatment: Gait belt;Rolling walker (2 wheels) Nurse Communication: Mobility status;Patient requests pain meds  Activity Tolerance: Patient limited by pain Patient left: in bed;with call bell/phone  within reach;with bed alarm set;with nursing/sitter in room (PA present)  OT Visit Diagnosis: Unsteadiness on feet (R26.81);Other abnormalities of gait and mobility (R26.89);Muscle weakness (generalized) (M62.81);History of falling (Z91.81)                Time: 9562-1308 OT Time Calculation  (min): 29 min Charges:  OT General Charges $OT Visit: 1 Visit OT Evaluation $OT Eval Moderate Complexity: 1 Mod OT Treatments $Self Care/Home Management : 8-22 mins  Ivor Messier, OT  Acute Rehabilitation Services Office [phone] Secure chat preferred   Marilynne Drivers 07/14/2023, 8:39 AM

## 2023-07-15 DIAGNOSIS — S72002A Fracture of unspecified part of neck of left femur, initial encounter for closed fracture: Secondary | ICD-10-CM | POA: Diagnosis not present

## 2023-07-15 LAB — CBC
HCT: 20.8 % — ABNORMAL LOW (ref 39.0–52.0)
Hemoglobin: 6.9 g/dL — CL (ref 13.0–17.0)
MCH: 28.8 pg (ref 26.0–34.0)
MCHC: 33.2 g/dL (ref 30.0–36.0)
MCV: 86.7 fL (ref 80.0–100.0)
Platelets: 142 10*3/uL — ABNORMAL LOW (ref 150–400)
RBC: 2.4 MIL/uL — ABNORMAL LOW (ref 4.22–5.81)
RDW: 14.5 % (ref 11.5–15.5)
WBC: 5.3 10*3/uL (ref 4.0–10.5)
nRBC: 0 % (ref 0.0–0.2)

## 2023-07-15 LAB — GLUCOSE, CAPILLARY
Glucose-Capillary: 195 mg/dL — ABNORMAL HIGH (ref 70–99)
Glucose-Capillary: 263 mg/dL — ABNORMAL HIGH (ref 70–99)
Glucose-Capillary: 337 mg/dL — ABNORMAL HIGH (ref 70–99)
Glucose-Capillary: 281 mg/dL — ABNORMAL HIGH (ref 70–99)

## 2023-07-15 LAB — HEMOGLOBIN AND HEMATOCRIT, BLOOD
HCT: 22.8 % — ABNORMAL LOW (ref 39.0–52.0)
HCT: 19.8 % — ABNORMAL LOW (ref 39.0–52.0)
Hemoglobin: 7.6 g/dL — ABNORMAL LOW (ref 13.0–17.0)
Hemoglobin: 6.6 g/dL — CL (ref 13.0–17.0)

## 2023-07-15 LAB — IRON AND TIBC
Iron: 12 ug/dL — ABNORMAL LOW (ref 45–182)
Saturation Ratios: 5 % — ABNORMAL LOW (ref 17.9–39.5)
TIBC: 221 ug/dL — ABNORMAL LOW (ref 250–450)
UIBC: 209 ug/dL

## 2023-07-15 LAB — FERRITIN: Ferritin: 117 ng/mL (ref 24–336)

## 2023-07-15 LAB — PREPARE RBC (CROSSMATCH)

## 2023-07-15 MED ORDER — FERROUS SULFATE 325 (65 FE) MG PO TABS
325.0000 mg | ORAL_TABLET | Freq: Two times a day (BID) | ORAL | Status: AC
  Administered 2023-07-15 – 2023-07-18 (×7): 325 mg via ORAL
  Filled 2023-07-15 (×7): qty 1

## 2023-07-15 MED ORDER — PROCHLORPERAZINE MALEATE 10 MG PO TABS: 10.0000 mg | ORAL_TABLET | Freq: Four times a day (QID) | ORAL | Status: DC | PRN

## 2023-07-15 MED ORDER — ALFUZOSIN HCL ER 10 MG PO TB24
10.0000 mg | ORAL_TABLET | Freq: Every day | ORAL | Status: DC
  Administered 2023-07-15 – 2023-07-19 (×5): 10 mg via ORAL
  Filled 2023-07-15 (×5): qty 1

## 2023-07-15 MED ORDER — PANCRELIPASE (LIP-PROT-AMYL) 36000-114000 UNITS PO CPEP
72000.0000 [IU] | ORAL_CAPSULE | Freq: Three times a day (TID) | ORAL | Status: DC
  Administered 2023-07-15 – 2023-07-19 (×12): 72000 [IU] via ORAL
  Filled 2023-07-15 (×13): qty 2

## 2023-07-15 MED ORDER — HYDROXYZINE HCL 25 MG PO TABS: 50.0000 mg | ORAL_TABLET | Freq: Every evening | ORAL | Status: DC | PRN

## 2023-07-15 MED ORDER — CLONAZEPAM 0.5 MG PO TABS
1.5000 mg | ORAL_TABLET | Freq: Every day | ORAL | Status: DC
  Administered 2023-07-15 – 2023-07-18 (×4): 1.5 mg via ORAL
  Filled 2023-07-15 (×4): qty 3

## 2023-07-15 MED ORDER — KETOCONAZOLE 2 % EX SHAM: 1.0000 | MEDICATED_SHAMPOO | CUTANEOUS | Status: DC

## 2023-07-15 MED ORDER — SODIUM CHLORIDE 0.9% IV SOLUTION: Freq: Once | INTRAVENOUS | Status: DC

## 2023-07-15 MED ORDER — MEGESTROL ACETATE 400 MG/10ML PO SUSP
400.0000 mg | Freq: Every day | ORAL | Status: DC
  Administered 2023-07-15 – 2023-07-19 (×5): 400 mg via ORAL
  Filled 2023-07-15 (×5): qty 10

## 2023-07-15 MED ORDER — PANCRELIPASE (LIP-PROT-AMYL) 36000-114000 UNITS PO CPEP
36000.0000 [IU] | ORAL_CAPSULE | Freq: Two times a day (BID) | ORAL | Status: DC | PRN
  Filled 2023-07-15: qty 1

## 2023-07-15 MED ORDER — SODIUM CHLORIDE 0.9 % IV SOLN
100.0000 mg | Freq: Once | INTRAVENOUS | Status: AC
  Administered 2023-07-15: 100 mg via INTRAVENOUS
  Filled 2023-07-15: qty 5

## 2023-07-15 MED ORDER — FLUOXETINE HCL 20 MG PO CAPS
20.0000 mg | ORAL_CAPSULE | Freq: Every day | ORAL | Status: DC
Start: 2023-07-15 — End: 2023-07-19
  Administered 2023-07-15 – 2023-07-19 (×5): 20 mg via ORAL
  Filled 2023-07-15 (×5): qty 1

## 2023-07-15 MED ORDER — PANTOPRAZOLE SODIUM 40 MG PO TBEC
40.0000 mg | DELAYED_RELEASE_TABLET | Freq: Every day | ORAL | Status: DC
  Administered 2023-07-15 – 2023-07-18 (×4): 40 mg via ORAL
  Filled 2023-07-15 (×4): qty 1

## 2023-07-15 MED ORDER — FERROUS SULFATE 325 (65 FE) MG PO TBEC
325.0000 mg | DELAYED_RELEASE_TABLET | Freq: Two times a day (BID) | ORAL | Status: DC
  Filled 2023-07-15: qty 1

## 2023-07-15 NOTE — Progress Notes (Signed)
 Patient is inquiring about his home medication Creon 36000 units (2 caps) 3 times per day.  He would like to start back on this medication if he is going to remain here for another night.

## 2023-07-15 NOTE — Progress Notes (Signed)
 Critical lab was called to on coming day shift nurse HGB 6.9.  Day shift is aware and will follow up.

## 2023-07-15 NOTE — Progress Notes (Addendum)
 PROGRESS NOTE    Andrew Tanner  WUJ:[id number] DOB: 1950/04/13 DOA: 07/12/2023 PCP: Jeani Sow, MD   Brief Narrative:  Andrew Tanner is a 73 y.o. male with medical history significant for type 2 diabetes, hyperlipidemia, moderate protein calorie malnutrition, who presents to the ER after mechanical fall -imaging confirms left femoral neck fracture, hospitalist called for admission.  Orthopedics called in consult.    Assessment & Plan:   Principal Problem:   Fracture of femoral neck, left (HCC)  Left femoral neck fracture, seen on x-ray, post mechanical fall, POA Pain currently well-controlled N.p.o. until seen by orthopedic surgery, Dr. Eulah Pont. Status post ORIF 07/13/2023 - tolerated well PT/OT to follow, appreciate recommendations   Acute likely blood loss anemia on chronic anemia of iron deficiency and malnutrition  -Patient self-reports extremely poor p.o. intake over the past few weeks -Iron panel remarkably low, initiate iron supplementation (IV iron sucrose x1 -p.o. iron twice daily initiated) -Repeat hemoglobin this afternoon confirmed ongoing anemia - 2u PRBC ordered -No signs or symptoms of bleeding, no notable ecchymosis petechiae, platelets minimally depressed but not significantly.  Fecal occult testing pending but no reports of black tarry or maroon stool.  Type 2 diabetes with hyperglycemia Hemoglobin A1c 6.8 on 04/21/2023 Continue sliding scale insulin, hypoglycemic protocol Continue IV fluids until p.o. intake is appropriate   Moderate protein calorie malnutrition Albumin 2.4, moderate muscle mass loss Encourage oral protein calorie intake post operatively   Hyperlipidemia Continue statin   Elevated BP, likely contributed by pain No diagnosis of hypertension  Non anion gap metabolic acidosis, mild, resolved Resolved with IV fluids  DVT prophylaxis: SCDs Start: 07/13/23 1558 SCDs Start: 07/13/23 0018 Code Status:   Code Status: Full  Code Family Communication: None present  Status is: Inpatient  Dispo: The patient is from: Group home              Anticipated d/c is to: To be determined, likely SNF              Anticipated d/c date is: 48 to 72 hours pending clinical course              Patient currently not medically stable for discharge  Consultants:  Orthopedic surgery  Procedures:  Pending above, planned ORIF, left femoral neck fracture  Antimicrobials:  Perioperatively  Subjective: No acute issues or events overnight, notes his stiffness and soreness are improving but not yet back to baseline.  Denies nausea vomiting diarrhea constipation any fevers chills or chest pain.  Denies any bleeding, easy bruising bleeding black stool hematemesis or bright red blood per rectum.   Objective: Vitals:   07/14/23 1613 07/14/23 2100 07/15/23 0443 07/15/23 0722  BP: 124/68 111/65  125/66  Pulse: 90 88 78 84  Resp: 18  16 17   Temp: 98.2 F (36.8 C) 98.3 F (36.8 C) 98.3 F (36.8 C) 98.7 F (37.1 C)  TempSrc: Oral Oral  Oral  SpO2: 100% 98% 98% 100%  Weight:      Height:        Intake/Output Summary (Last 24 hours) at 07/15/2023 0732 Last data filed at 07/14/2023 1756 Gross per 24 hour  Intake 484.9 ml  Output 50 ml  Net 434.9 ml   Filed Weights   07/12/23 2107 07/13/23 0918  Weight: 68 kg (P) 68 kg    Examination:  General:  Pleasantly resting in bed, No acute distress. HEENT:  Normocephalic atraumatic.  Sclerae nonicteric, noninjected.  Extraocular movements intact bilaterally. Neck:  Without mass or deformity.  Trachea is midline. Lungs:  Clear to auscultate bilaterally without rhonchi, wheeze, or rales. Heart:  Regular rate and rhythm.  Without murmurs, rubs, or gallops. Abdomen:  Soft, nontender, nondistended.  Without guarding or rebound. Extremities: Without cyanosis, clubbing, edema, or obvious deformity. Skin:  Warm and dry, no erythema.  Left hip bandage clean dry intact   Data Reviewed:  I have personally reviewed following labs and imaging studies  CBC: Recent Labs  Lab 07/12/23 2158 07/13/23 0625 07/13/23 1330 07/14/23 0556 07/15/23 0524  WBC 9.7 8.4  --  9.2 5.3  HGB 11.6* 12.0* 10.2* 8.3* 6.9*  HCT 35.0* 35.7* 30.0* 24.7* 20.8*  MCV 86.4 86.4  --  86.7 86.7  PLT 227 196  --  154 142*   Basic Metabolic Panel: Recent Labs  Lab 07/12/23 2158 07/13/23 0625 07/13/23 1330 07/14/23 0556  NA 135 136 135 133*  K 4.0 4.0 4.4 4.6  CL 109 106 104 106  CO2 19* 23  --  20*  GLUCOSE 214* 144* 153* 213*  BUN 12 9 8 11   CREATININE 0.76 0.78 0.50* 0.89  CALCIUM 9.1 9.2  --  9.0  MG  --  1.7  --   --   PHOS  --  3.0  --   --    GFR: Estimated Creatinine Clearance: 70 mL/min (by C-G formula based on SCr of 0.89 mg/dL).  CBG: Recent Labs  Lab 07/14/23 0756 07/14/23 1130 07/14/23 1611 07/14/23 2016 07/15/23 0631  GLUCAP 213* 180* 259* 228* 195*   Recent Results (from the past 240 hours)  Surgical pcr screen     Status: Abnormal   Collection Time: 07/13/23  6:39 AM   Specimen: Nasal Mucosa; Nasal Swab  Result Value Ref Range Status   MRSA, PCR NEGATIVE NEGATIVE Final   Staphylococcus aureus POSITIVE (A) NEGATIVE Final    Comment: (NOTE) The Xpert SA Assay (FDA approved for NASAL specimens in patients 48 years of age and older), is one component of a comprehensive surveillance program. It is not intended to diagnose infection nor to guide or monitor treatment. Performed at [facility], [address] [place], [place] [address]      Radiology Studies: DG HIP PORT UNILAT W OR W/O PELVIS 1V LEFT Result Date: 07/13/2023 CLINICAL DATA:  Postop in PACU. EXAM: DG HIP (WITH OR WITHOUT PELVIS) 1V PORT LEFT COMPARISON:  Preoperative imaging FINDINGS: Left hip arthroplasty in expected alignment. No periprosthetic lucency or fracture. Recent postsurgical change includes air and edema in the soft tissues. IMPRESSION: Left hip arthroplasty without immediate  postoperative complication. Electronically Signed   By: Narda Rutherford M.D.   On: 07/13/2023 16:27   DG HIP UNILAT WITH PELVIS 1V LEFT Result Date: 07/13/2023 CLINICAL DATA:  Elective surgery EXAM: DG HIP (WITH OR WITHOUT PELVIS) 1V*L* COMPARISON:  Preoperative imaging FINDINGS: Seven fluoroscopic spot views of the pelvis and left hip obtained in the operating room. Sequential images during hip arthroplasty. Fluoroscopy time 55 seconds. Dose 5.404 mGy. IMPRESSION: Intraoperative fluoroscopy during left hip arthroplasty. Electronically Signed   By: Narda Rutherford M.D.   On: 07/13/2023 16:21   DG C-Arm 1-60 Min-No Report Result Date: 07/13/2023 Fluoroscopy was utilized by the requesting physician.  No radiographic interpretation.   DG C-Arm 1-60 Min-No Report Result Date: 07/13/2023 Fluoroscopy was utilized by the requesting physician.  No radiographic interpretation.   DG C-Arm 1-60 Min-No Report Result Date: 07/13/2023 Fluoroscopy was utilized by the requesting  physician.  No radiographic interpretation.   DG C-Arm 1-60 Min-No Report Result Date: 07/13/2023 Fluoroscopy was utilized by the requesting physician.  No radiographic interpretation.    Scheduled Meds:  acetaminophen  1,000 mg Oral Q6H   alfuzosin  10 mg Oral Q breakfast   aspirin  325 mg Oral Daily   cholecalciferol  1,000 Units Oral Daily   clonazePAM  1.5 mg Oral QHS   docusate sodium  100 mg Oral BID   FLUoxetine  20 mg Oral Daily   Gerhardt's butt cream   Topical Daily   insulin aspart  0-5 Units Subcutaneous QHS   insulin aspart  0-9 Units Subcutaneous TID WC   [START ON 07/17/2023] ketoconazole  1 Application Topical Q M,W,F   ketorolac  15 mg Intravenous Q6H   lipase/protease/amylase  72,000 Units Oral TID AC   megestrol  400 mg Oral Daily   methocarbamol  500 mg Oral QID   Or   methocarbamol (ROBAXIN) injection  500 mg Intravenous QID   multivitamin with minerals  1 tablet Oral Daily   pantoprazole  40 mg Oral QHS    rosuvastatin  20 mg Oral Daily   Continuous Infusions:     LOS: 2 days   Time spent:  Azucena Fallen, DO [facility]  If 7PM-7AM, please contact night-coverage [email or web address]  07/15/2023, 7:32 AM

## 2023-07-15 NOTE — Progress Notes (Addendum)
     2 Days Post-Op Procedure(s) (LRB): ARTHROPLASTY, HIP, TOTAL, ANTERIOR APPROACH (Left) Subjective:  Patient reports pain as mild to moderate.  Slept okay.  Hgb 6.9 this morning.  Denies chest pain, ShOB, N/V, dizziness.  Otherwise no overnight events.  Denies numbness or tingling.  No other complaints at this time.  Likely SNF upon discharge.  Objective:   VITALS:   Vitals:   07/14/23 1613 07/14/23 2100 07/15/23 0443 07/15/23 0722  BP: 124/68 111/65  125/66  Pulse: 90 88 78 84  Resp: 18  16 17   Temp: 98.2 F (36.8 C) 98.3 F (36.8 C) 98.3 F (36.8 C) 98.7 F (37.1 C)  TempSrc: Oral Oral  Oral  SpO2: 100% 98% 98% 100%  Weight:      Height:        AAOx4, resting comfortably, in NAD  Neurologically intact Sensation intact distally Intact pulses distally Dorsiflexion/Plantar flexion intact Incision: dressing C/D/I Compartment soft Wiggles toes appropriately   Lab Results  Component Value Date   WBC 5.3 07/15/2023   HGB 6.9 (LL) 07/15/2023   HCT 20.8 (L) 07/15/2023   MCV 86.7 07/15/2023   PLT 142 (L) 07/15/2023   BMET    Component Value Date/Time   NA 133 (L) 07/14/2023 0556   NA 134 01/20/2020 1053   K 4.6 07/14/2023 0556   CL 106 07/14/2023 0556   CO2 20 (L) 07/14/2023 0556   GLUCOSE 213 (H) 07/14/2023 0556   BUN 11 07/14/2023 0556   BUN 22 01/20/2020 1053   CREATININE 0.89 07/14/2023 0556   CREATININE 1.07 04/06/2021 0913   CALCIUM 9.0 07/14/2023 0556   EGFR 74 04/06/2021 0913   GFRNONAA >60 07/14/2023 0556   GFRNONAA >89 03/10/2014 1007     Xray: stable post-operative imaging  Assessment/Plan: 2 Days Post-Op   Principal Problem:   Fracture of femoral neck, left (HCC)  Notable post-operative anemia with Hgb of 6.9 this morning.  May consider transfusion(s) and/or holding DVT prophylaxis as needed.  Otherwise clinical examination reassuring, hemodynamically stable.  Weightbearing: WBAT LLE ROM: No posterior hip precautions  Incisional  and dressing care: Dressings left intact until follow-up  Showering:  ok to shower with Aquacel in place Orthopedic device(s): None  Pain management:  1. Tylenol 1000 mg q 6 hours scheduled 2. Robaxin 500 mg QID 3. Oxycodone 5 mg q 4 hours PRN 4. Dilaudid 0.5 mg q 4 hours PRN VTE prophylaxis: Aspirin, SCDs ID:  Ancef 2gm post op Foley/Lines:  No foley, KVO IVFs Medical co-morbidities: Diabetes. Vit D level low normal at 34, have started supplementation Dispo: PT/OT eval today, OT currently recommending SNF.  Okay for discharge from ortho standpoint once cleared by medicine team and therapies   D/C recommendations: - Oxycodone for pain control - ASA for DVT prophylaxis - Continue 1000 units Vit D supplementation daily x 90 days   Follow - up plan: 2 weeks after d.c for wound check and repeat x-rays  Cecil Cobbs 07/15/2023, 7:58 AM   Contact information:   [email or web address] 7am-5pm epic message Dr. Blanchie Dessert, or call office   After hours and holidays please check [email or web address] for group call information for [facility]

## 2023-07-16 DIAGNOSIS — S72002A Fracture of unspecified part of neck of left femur, initial encounter for closed fracture: Secondary | ICD-10-CM | POA: Diagnosis not present

## 2023-07-16 LAB — TYPE AND SCREEN
ABO/RH(D): A POS
Antibody Screen: NEGATIVE
Unit division: 0
Unit division: 0

## 2023-07-16 LAB — BPAM RBC
Blood Product Expiration Date: 202504292359
Blood Product Expiration Date: 202505032359
ISSUE DATE / TIME: 202504051607
ISSUE DATE / TIME: 202504051835
Unit Type and Rh: 6200
Unit Type and Rh: 6200

## 2023-07-16 LAB — HEMOGLOBIN AND HEMATOCRIT, BLOOD
HCT: 26.6 % — ABNORMAL LOW (ref 39.0–52.0)
Hemoglobin: 9.1 g/dL — ABNORMAL LOW (ref 13.0–17.0)

## 2023-07-16 LAB — OCCULT BLOOD X 1 CARD TO LAB, STOOL: Fecal Occult Bld: NEGATIVE

## 2023-07-16 LAB — GLUCOSE, CAPILLARY
Glucose-Capillary: 215 mg/dL — ABNORMAL HIGH (ref 70–99)
Glucose-Capillary: 220 mg/dL — ABNORMAL HIGH (ref 70–99)
Glucose-Capillary: 303 mg/dL — ABNORMAL HIGH (ref 70–99)
Glucose-Capillary: 305 mg/dL — ABNORMAL HIGH (ref 70–99)

## 2023-07-16 NOTE — Progress Notes (Signed)
     3 Days Post-Op Procedure(s) (LRB): ARTHROPLASTY, HIP, TOTAL, ANTERIOR APPROACH (Left) Subjective:  Patient reports pain as mild to moderate.  Slept okay.  Hgb 6.9 and 6.6 yesterday morning and received transfusion 1 unit.  Denies chest pain, ShOB, N/V, dizziness.  Some mild constipation, asking for PRN laxative.  Otherwise no overnight events.  Denies numbness or tingling.  No other complaints at this time.  Likely SNF upon discharge.  Objective:   VITALS:   Vitals:   07/15/23 1845 07/15/23 1919 07/15/23 1938 07/15/23 2122  BP: 122/71 131/71 131/68 115/61  Pulse:    83  Resp:      Temp: 98.7 F (37.1 C) 98.6 F (37 C) 98.8 F (37.1 C) 99 F (37.2 C)  TempSrc: Oral Oral Oral Oral  SpO2: 98% 99% 99% 98%  Weight:      Height:        AAOx4, resting comfortably, in NAD  Neurologically intact Sensation intact distally Intact pulses distally Dorsiflexion/Plantar flexion intact Incision: dressing C/D/I Compartment soft Wiggles toes appropriately   Lab Results  Component Value Date   WBC 5.3 07/15/2023   HGB 6.6 (LL) 07/15/2023   HCT 19.8 (L) 07/15/2023   MCV 86.7 07/15/2023   PLT 142 (L) 07/15/2023   BMET    Component Value Date/Time   NA 133 (L) 07/14/2023 0556   NA 134 01/20/2020 1053   K 4.6 07/14/2023 0556   CL 106 07/14/2023 0556   CO2 20 (L) 07/14/2023 0556   GLUCOSE 213 (H) 07/14/2023 0556   BUN 11 07/14/2023 0556   BUN 22 01/20/2020 1053   CREATININE 0.89 07/14/2023 0556   CREATININE 1.07 04/06/2021 0913   CALCIUM 9.0 07/14/2023 0556   EGFR 74 04/06/2021 0913   GFRNONAA >60 07/14/2023 0556   GFRNONAA >89 03/10/2014 1007     Xray: stable post-operative imaging  Assessment/Plan: 3 Days Post-Op   Principal Problem:   Fracture of femoral neck, left (HCC)  Continued remarkable post-operative anemia, transfused 1 unit yesterday.  May consider additional transfusion(s) and/or holding DVT prophylaxis as needed.  Otherwise clinical examination  reassuring, hemodynamically stable.  Has daily colace ordered, can utilize PRN Miralax ordered as well for constipation.  Weightbearing: WBAT LLE ROM: No posterior hip precautions  Incisional and dressing care: Dressings left intact until follow-up  Showering:  ok to shower with Aquacel in place Orthopedic device(s): None  Pain management:  1. Tylenol 1000 mg q 6 hours scheduled 2. Robaxin 500 mg QID 3. Oxycodone 5 mg q 4 hours PRN 4. Dilaudid 0.5 mg q 4 hours PRN VTE prophylaxis: Aspirin, SCDs ID:  Ancef 2gm post op Foley/Lines:  No foley, KVO IVFs Medical co-morbidities: Diabetes. Vit D level low normal at 34, have started supplementation Dispo: PT/OT eval today, OT currently recommending SNF.  Okay for discharge from ortho standpoint once cleared by medicine team and therapies   D/C recommendations: - Oxycodone for pain control - ASA for DVT prophylaxis - Continue 1000 units Vit D supplementation daily x 90 days   Follow - up plan: 2 weeks after d.c for wound check and repeat x-rays  Cecil Cobbs 07/16/2023, 7:02 AM   Contact information:   [email or web address] 7am-5pm epic message Dr. Blanchie Dessert, or call office   After hours and holidays please check [email or web address] for group call information for [facility]

## 2023-07-16 NOTE — Progress Notes (Signed)
 PROGRESS NOTE    Andrew Tanner  ZOX:[id number] DOB: December 08, 1950 DOA: 07/12/2023 PCP: Jeani Sow, MD   Brief Narrative:  Andrew Tanner is a 73 y.o. male with medical history significant for type 2 diabetes, hyperlipidemia, moderate protein calorie malnutrition, who presents to the ER after mechanical fall -imaging confirms left femoral neck fracture, hospitalist called for admission.  Orthopedics called in consult.    Assessment & Plan:   Principal Problem:   Fracture of femoral neck, left (HCC)  Left femoral neck fracture, seen on x-ray, post mechanical fall, POA Pain currently well-controlled N.p.o. until seen by orthopedic surgery, Dr. Eulah Pont. Status post ORIF 07/13/2023 - tolerated well PT/OT to follow, appreciate recommendations   Acute likely blood loss anemia on chronic anemia of iron deficiency and malnutrition  -Patient self-reports extremely poor p.o. intake over the past few weeks -Iron panel remarkably low, initiate iron supplementation (IV iron sucrose x1 -p.o. iron twice daily initiated) -Status post 2 unit PRBC(4/5), repeat hemoglobin 9.1 this morning -No signs or symptoms of bleeding, no notable ecchymosis petechiae, platelets minimally depressed but not significantly.  Fecal occult testing pending but no reports of black tarry or maroon stool.  Type 2 diabetes with hyperglycemia Hemoglobin A1c 6.8 on 04/21/2023 Continue sliding scale insulin, hypoglycemic protocol Continue IV fluids until p.o. intake is appropriate   Moderate protein calorie malnutrition Albumin 2.4, moderate muscle mass loss Encourage oral protein calorie intake post operatively   Hyperlipidemia Continue statin   Elevated BP, likely contributed by pain No diagnosis of hypertension  Non anion gap metabolic acidosis, mild, resolved Resolved with IV fluids  DVT prophylaxis: SCDs Start: 07/13/23 1558 SCDs Start: 07/13/23 0018 Code Status:   Code Status: Full Code Family  Communication: None present  Status is: Inpatient  Dispo: The patient is from: Group home              Anticipated d/c is to: To be determined, likely SNF              Anticipated d/c date is: 24-48 hours pending clinical course              Patient currently not medically stable for discharge  Consultants:  Orthopedic surgery  Procedures:  Left femur ORIF 4/ 3/25  Antimicrobials:  Perioperatively  Subjective: No acute issues or events overnight, feels markedly improved after transfusion yesterday, otherwise denies nausea vomiting diarrhea constipation headache fevers chills or chest pain.   Objective: Vitals:   07/15/23 1845 07/15/23 1919 07/15/23 1938 07/15/23 2122  BP: 122/71 131/71 131/68 115/61  Pulse:    83  Resp:      Temp: 98.7 F (37.1 C) 98.6 F (37 C) 98.8 F (37.1 C) 99 F (37.2 C)  TempSrc: Oral Oral Oral Oral  SpO2: 98% 99% 99% 98%  Weight:      Height:        Intake/Output Summary (Last 24 hours) at 07/16/2023 0729 Last data filed at 07/16/2023 0700 Gross per 24 hour  Intake 826 ml  Output 1500 ml  Net -674 ml   Filed Weights   07/12/23 2107 07/13/23 0918  Weight: 68 kg (P) 68 kg    Examination:  General:  Pleasantly resting in bed, No acute distress. HEENT:  Normocephalic atraumatic.  Sclerae nonicteric, noninjected.  Extraocular movements intact bilaterally. Neck:  Without mass or deformity.  Trachea is midline. Lungs:  Clear to auscultate bilaterally without rhonchi, wheeze, or rales. Heart:  Regular rate and rhythm.  Without murmurs, rubs,  or gallops. Abdomen:  Soft, nontender, nondistended.  Without guarding or rebound. Extremities: Without cyanosis, clubbing, edema, or obvious deformity. Skin:  Warm and dry, no erythema.  Left hip bandage clean dry intact   Data Reviewed: I have personally reviewed following labs and imaging studies  CBC: Recent Labs  Lab 07/12/23 2158 07/13/23 0625 07/13/23 1330 07/14/23 0556 07/15/23 0524  07/15/23 0813 07/15/23 1435  WBC 9.7 8.4  --  9.2 5.3  --   --   HGB 11.6* 12.0* 10.2* 8.3* 6.9* 7.6* 6.6*  HCT 35.0* 35.7* 30.0* 24.7* 20.8* 22.8* 19.8*  MCV 86.4 86.4  --  86.7 86.7  --   --   PLT 227 196  --  154 142*  --   --    Basic Metabolic Panel: Recent Labs  Lab 07/12/23 2158 07/13/23 0625 07/13/23 1330 07/14/23 0556  NA 135 136 135 133*  K 4.0 4.0 4.4 4.6  CL 109 106 104 106  CO2 19* 23  --  20*  GLUCOSE 214* 144* 153* 213*  BUN 12 9 8 11   CREATININE 0.76 0.78 0.50* 0.89  CALCIUM 9.1 9.2  --  9.0  MG  --  1.7  --   --   PHOS  --  3.0  --   --    GFR: Estimated Creatinine Clearance: 70 mL/min (by C-G formula based on SCr of 0.89 mg/dL).  CBG: Recent Labs  Lab 07/15/23 0631 07/15/23 1150 07/15/23 1631 07/15/23 2214 07/16/23 0621  GLUCAP 195* 263* 337* 281* 215*   Recent Results (from the past 240 hours)  Surgical pcr screen     Status: Abnormal   Collection Time: 07/13/23  6:39 AM   Specimen: Nasal Mucosa; Nasal Swab  Result Value Ref Range Status   MRSA, PCR NEGATIVE NEGATIVE Final   Staphylococcus aureus POSITIVE (A) NEGATIVE Final    Comment: (NOTE) The Xpert SA Assay (FDA approved for NASAL specimens in patients 48 years of age and older), is one component of a comprehensive surveillance program. It is not intended to diagnose infection nor to guide or monitor treatment. Performed at [facility], [address], [place], [place] [address]      Radiology Studies: No results found.   Scheduled Meds:  sodium chloride   Intravenous Once   acetaminophen  1,000 mg Oral Q6H   alfuzosin  10 mg Oral Q breakfast   aspirin  325 mg Oral Daily   cholecalciferol  1,000 Units Oral Daily   clonazePAM  1.5 mg Oral QHS   docusate sodium  100 mg Oral BID   ferrous sulfate  325 mg Oral BID WC   FLUoxetine  20 mg Oral Daily   Gerhardt's butt cream   Topical Daily   insulin aspart  0-5 Units Subcutaneous QHS   insulin aspart  0-9 Units  Subcutaneous TID WC   [START ON 07/17/2023] ketoconazole  1 Application Topical Q M,W,F   lipase/protease/amylase  72,000 Units Oral TID AC   megestrol  400 mg Oral Daily   methocarbamol  500 mg Oral QID   Or   methocarbamol (ROBAXIN) injection  500 mg Intravenous QID   multivitamin with minerals  1 tablet Oral Daily   pantoprazole  40 mg Oral QHS   rosuvastatin  20 mg Oral Daily   Continuous Infusions:     LOS: 3 days   Time spent:  Azucena Fallen, DO [facility]  If 7PM-7AM, please contact night-coverage [email or web address]  07/16/2023, 7:29 AM

## 2023-07-16 NOTE — Plan of Care (Signed)

## 2023-07-16 NOTE — Plan of Care (Signed)
  Problem: Nutritional: Goal: Maintenance of adequate nutrition will improve Outcome: Progressing   Problem: Skin Integrity: Goal: Risk for impaired skin integrity will decrease Outcome: Progressing   Problem: Activity: Goal: Risk for activity intolerance will decrease Outcome: Progressing   Problem: Safety: Goal: Ability to remain free from injury will improve Outcome: Progressing

## 2023-07-16 NOTE — Progress Notes (Signed)
 Mobility Specialist Progress Note:   07/16/23 1326  Mobility  Activity Transferred from bed to chair  Level of Assistance Moderate assist, patient does 50-74%  Assistive Device Front wheel walker  Distance Ambulated (ft) 3 ft  LLE Weight Bearing Per Provider Order WBAT  Activity Response Tolerated well  Mobility Referral Yes  Mobility visit 1 Mobility  Mobility Specialist Start Time (ACUTE ONLY) 1110  Mobility Specialist Stop Time (ACUTE ONLY) 1125  Mobility Specialist Time Calculation (min) (ACUTE ONLY) 15 min   Pt received in bed, agreeable to mobility. Pt needing ModA to come to EOB with heavy use of chuck pad. MinA to stand from elevated bed height. Pt displayed posterior lean throughout session needing MinA to keep steady. Pt was able to take a few small steps forward but was unable to lateral step towards chair. Pt displayed unsteadiness during transfer needing chair to be rolled behind pt to prevent fall. Pt denied any discomfort during session. Pt left in chair with call bell in reach and all needs met. Chair alarm on.   Leory Plowman  Mobility Specialist Please contact via SecureChat Rehab office at [phone]

## 2023-07-17 DIAGNOSIS — S72002A Fracture of unspecified part of neck of left femur, initial encounter for closed fracture: Secondary | ICD-10-CM | POA: Diagnosis not present

## 2023-07-17 LAB — CBC
HCT: 28.4 % — ABNORMAL LOW (ref 39.0–52.0)
Hemoglobin: 9.6 g/dL — ABNORMAL LOW (ref 13.0–17.0)
MCH: 29 pg (ref 26.0–34.0)
MCHC: 33.8 g/dL (ref 30.0–36.0)
MCV: 85.8 fL (ref 80.0–100.0)
Platelets: 200 10*3/uL (ref 150–400)
RBC: 3.31 MIL/uL — ABNORMAL LOW (ref 4.22–5.81)
RDW: 14.1 % (ref 11.5–15.5)
WBC: 6.3 10*3/uL (ref 4.0–10.5)
nRBC: 0 % (ref 0.0–0.2)

## 2023-07-17 LAB — GLUCOSE, CAPILLARY
Glucose-Capillary: 175 mg/dL — ABNORMAL HIGH (ref 70–99)
Glucose-Capillary: 329 mg/dL — ABNORMAL HIGH (ref 70–99)
Glucose-Capillary: 274 mg/dL — ABNORMAL HIGH (ref 70–99)
Glucose-Capillary: 258 mg/dL — ABNORMAL HIGH (ref 70–99)

## 2023-07-17 NOTE — Inpatient Diabetes Management (Signed)
 Inpatient Diabetes Program Recommendations  AACE/ADA: New Consensus Statement on Inpatient Glycemic Control (2015)  Target Ranges:  Prepandial:   less than 140 mg/dL      Peak postprandial:   less than 180 mg/dL (1-2 hours)      Critically ill patients:  140 - 180 mg/dL   Lab Results  Component Value Date   GLUCAP 175 (H) 07/17/2023   HGBA1C 6.8 (H) 04/21/2023    Review of Glycemic Control  Latest Reference Range & Units 07/15/23 22:14 07/16/23 06:21 07/16/23 12:26 07/16/23 17:15 07/16/23 21:21 07/17/23 06:39  Glucose-Capillary 70 - 99 mg/dL 914 (H) 782 (H) 956 (H) 303 (H) 305 (H) 175 (H)  (H): Data is abnormally high Diabetes history: Type 2 DM Outpatient Diabetes medications: Lantus 12 units at bedtime, Novolog 4 units TID Current orders for Inpatient glycemic control: Novolog 0-9 units TID & HS  Inpatient Diabetes Program Recommendations:   If to remain inpatient: Consider adding Semglee 8 units every day and Novolog 2 units TID (Assuming patient is consuming >50% of meals) and changing diet to carb modified.  Thanks, Lujean Rave, MSN, RNC-OB Diabetes Coordinator [phone] (8a-5p)

## 2023-07-17 NOTE — Progress Notes (Signed)
 PROGRESS NOTE    Andrew Tanner  WUJ:[id number] DOB: 01/26/1951 DOA: 07/12/2023 PCP: Jeani Sow, MD   Brief Narrative:  Byan Poplaski is a 73 y.o. male with medical history significant for type 2 diabetes, hyperlipidemia, moderate protein calorie malnutrition, who presents to the ER after mechanical fall -imaging confirms left femoral neck fracture, hospitalist called for admission.  Orthopedics called in consult.    Assessment & Plan:   Principal Problem:   Fracture of femoral neck, left (HCC)  Left femoral neck fracture, seen on x-ray, post mechanical fall, POA Presumed osteoporosis given fracture from standing height, POA Pain currently well-controlled N.p.o. until seen by orthopedic surgery, Dr. Eulah Pont. Status post ORIF 07/13/2023 - tolerated well Continue Vit D supplementation PT/OT to follow, appreciate recommendations   Acute likely blood loss anemia on chronic anemia of iron deficiency and malnutrition  -Patient self-reports extremely poor p.o. intake over the past few weeks -Iron panel remarkably low, initiate iron supplementation (IV iron sucrose x1 -p.o. iron twice daily initiated) -Status post 2 unit PRBC (4/5), repeat hemoglobin trending upwards, currently 9.6 - no longer following -No signs or symptoms of bleeding, no notable ecchymosis petechiae, platelets minimally depressed but not significantly.  Fecal occult testing pending but no reports of black tarry or maroon stool.  Type 2 diabetes with hyperglycemia Hemoglobin A1c 6.8 on 04/21/2023 Continue sliding scale insulin, hypoglycemic protocol Continue IV fluids until p.o. intake is appropriate   Moderate protein calorie malnutrition Albumin 2.4, moderate muscle mass loss Encourage oral protein calorie intake post operatively   Hyperlipidemia Continue statin   Elevated BP, likely contributed by pain No diagnosis of hypertension  Non anion gap metabolic acidosis, mild, resolved Resolved with IV  fluids  DVT prophylaxis: SCDs Start: 07/13/23 1558 SCDs Start: 07/13/23 0018 Code Status:   Code Status: Full Code Family Communication: None present  Status is: Inpatient  Dispo: The patient is from: Group home              Anticipated d/c is to: To be determined, likely SNF              Anticipated d/c date is: 24-48 hours pending clinical course              Patient currently not medically stable for discharge  Consultants:  Orthopedic surgery  Procedures:  Left femur ORIF 4/ 3/25  Antimicrobials:  Perioperatively  Subjective: No acute issues or events overnight, feels markedly improved after transfusion yesterday, otherwise denies nausea vomiting diarrhea constipation headache fevers chills or chest pain.  Patient ambulating outside of his room with physical therapy this morning, doing quite well with a walker.   Objective: Vitals:   07/16/23 0831 07/16/23 1531 07/16/23 2024 07/17/23 0459  BP: 131/71 116/64 133/84 (!) 144/83  Pulse: 78 79 79 71  Resp:  18 19 16   Temp: 99.4 F (37.4 C) 98.5 F (36.9 C) 99.3 F (37.4 C) 98.8 F (37.1 C)  TempSrc: Oral  Oral Oral  SpO2:  97% 99% 98%  Weight:      Height:        Intake/Output Summary (Last 24 hours) at 07/17/2023 0750 Last data filed at 07/17/2023 0707 Gross per 24 hour  Intake 350 ml  Output 1100 ml  Net -750 ml   Filed Weights   07/12/23 2107 07/13/23 0918  Weight: 68 kg (P) 68 kg    Examination:  General:  Pleasantly resting in bed, No acute distress. HEENT:  Normocephalic atraumatic.  Sclerae nonicteric,  noninjected.  Extraocular movements intact bilaterally. Neck:  Without mass or deformity.  Trachea is midline. Lungs:  Clear to auscultate bilaterally without rhonchi, wheeze, or rales. Heart:  Regular rate and rhythm.  Without murmurs, rubs, or gallops. Abdomen:  Soft, nontender, nondistended.  Without guarding or rebound. Extremities: Without cyanosis, clubbing, edema, or obvious deformity. Skin:   Warm and dry, no erythema.  Left hip bandage clean dry intact   Data Reviewed: I have personally reviewed following labs and imaging studies  CBC: Recent Labs  Lab 07/12/23 2158 07/13/23 0625 07/13/23 1330 07/14/23 0556 07/15/23 0524 07/15/23 0813 07/15/23 1435 07/16/23 0952 07/17/23 0545  WBC 9.7 8.4  --  9.2 5.3  --   --   --  6.3  HGB 11.6* 12.0*   < > 8.3* 6.9* 7.6* 6.6* 9.1* 9.6*  HCT 35.0* 35.7*   < > 24.7* 20.8* 22.8* 19.8* 26.6* 28.4*  MCV 86.4 86.4  --  86.7 86.7  --   --   --  85.8  PLT 227 196  --  154 142*  --   --   --  200   < > = values in this interval not displayed.   Basic Metabolic Panel: Recent Labs  Lab 07/12/23 2158 07/13/23 0625 07/13/23 1330 07/14/23 0556  NA 135 136 135 133*  K 4.0 4.0 4.4 4.6  CL 109 106 104 106  CO2 19* 23  --  20*  GLUCOSE 214* 144* 153* 213*  BUN 12 9 8 11   CREATININE 0.76 0.78 0.50* 0.89  CALCIUM 9.1 9.2  --  9.0  MG  --  1.7  --   --   PHOS  --  3.0  --   --    GFR: Estimated Creatinine Clearance: 70 mL/min (by C-G formula based on SCr of 0.89 mg/dL).  CBG: Recent Labs  Lab 07/16/23 0621 07/16/23 1226 07/16/23 1715 07/16/23 2121 07/17/23 0639  GLUCAP 215* 220* 303* 305* 175*   Recent Results (from the past 240 hours)  Surgical pcr screen     Status: Abnormal   Collection Time: 07/13/23  6:39 AM   Specimen: Nasal Mucosa; Nasal Swab  Result Value Ref Range Status   MRSA, PCR NEGATIVE NEGATIVE Final   Staphylococcus aureus POSITIVE (A) NEGATIVE Final    Comment: (NOTE) The Xpert SA Assay (FDA approved for NASAL specimens in patients 46 years of age and older), is one component of a comprehensive surveillance program. It is not intended to diagnose infection nor to guide or monitor treatment. Performed at [facility], [address], [place], [place] [address]      Radiology Studies: No results found.   Scheduled Meds:  sodium chloride   Intravenous Once   acetaminophen  1,000 mg Oral Q6H    alfuzosin  10 mg Oral Q breakfast   aspirin  325 mg Oral Daily   cholecalciferol  1,000 Units Oral Daily   clonazePAM  1.5 mg Oral QHS   docusate sodium  100 mg Oral BID   ferrous sulfate  325 mg Oral BID WC   FLUoxetine  20 mg Oral Daily   Gerhardt's butt cream   Topical Daily   insulin aspart  0-5 Units Subcutaneous QHS   insulin aspart  0-9 Units Subcutaneous TID WC   ketoconazole  1 Application Topical Q M,W,F   lipase/protease/amylase  72,000 Units Oral TID AC   megestrol  400 mg Oral Daily   methocarbamol  500 mg Oral  QID   Or   methocarbamol (ROBAXIN) injection  500 mg Intravenous QID   multivitamin with minerals  1 tablet Oral Daily   pantoprazole  40 mg Oral QHS   rosuvastatin  20 mg Oral Daily   Continuous Infusions:     LOS: 4 days   Time spent:  Azucena Fallen, DO [facility]  If 7PM-7AM, please contact night-coverage [email or web address]  07/17/2023, 7:50 AM

## 2023-07-17 NOTE — Progress Notes (Signed)
 Mobility Specialist Progress Note:    07/17/23 1500  Mobility  Activity Ambulated with assistance in room  Level of Assistance Minimal assist, patient does 75% or more  Assistive Device Front wheel walker  Distance Ambulated (ft) 40 ft  LLE Weight Bearing Per Provider Order WBAT  Activity Response Tolerated well  Mobility Referral Yes  Mobility visit 1 Mobility  Mobility Specialist Start Time (ACUTE ONLY) 1512  Mobility Specialist Stop Time (ACUTE ONLY) 1523  Mobility Specialist Time Calculation (min) (ACUTE ONLY) 11 min   Pt received in bed and agreeable. Required minA to come EOB and stand. No complaints throughout ambulation. Required cues for posture and walker management. Pt left in bed with call bell and all needs met. Bed alarm on.  D'Vante Earlene Plater Mobility Specialist Please contact via Special educational needs teacher or Rehab office at [phone]

## 2023-07-17 NOTE — Progress Notes (Signed)
 Physical Therapy Treatment Patient Details Name: Andrew Tanner MRN: [id number] DOB: 05-24-1950 Today's Date: 07/17/2023   History of Present Illness Pt is a 73 y.o. male admitted via EMS 07/12/23 for fall at rehab facility. X-ray showed L femoral neck fx. 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,  basal cell carcinoma, diabetes mellitus, diabetic neuropathy, neuromuscular disorder, hyperlipidemia, malignant neuroendocrine tumor of duodenum    PT Comments  Continuing work on functional mobility and activity tolerance; session focused on sit to stand, and initial work on gait; used the stedy standing assist frame, with it's bar in front of the patient, to encourage forward lean/anterior weight shift with sit to stand transfers; patient performed multiple reps of sit to stand from the high seat of the stedy, as well as from the lower seat height of the recliner; he was able to walk into the hallway, briefly, with verbal cues for gait sequence, and to keep the rolling walker close; still has a tendency for posterior bias, but able to correct with cues; continue to recommend postacute rehab to maximize independence and safety with functional mobility and activities of daily living and allow for safe transition back to home    If plan is discharge home, recommend the following: A lot of help with walking and/or transfers;A lot of help with bathing/dressing/bathroom;Assistance with cooking/housework;Direct supervision/assist for medications management;Direct supervision/assist for financial management;Assist for transportation;Help with stairs or ramp for entrance;Supervision due to cognitive status   Can travel by private vehicle     Yes  Equipment Recommendations  None recommended by PT    Recommendations for Other Services       Precautions / Restrictions Precautions Precautions: Fall Recall of Precautions/Restrictions: Impaired Precaution/Restrictions Comments: Needed  reminders for WBAT Restrictions LLE Weight Bearing Per Provider Order: Weight bearing as tolerated     Mobility  Bed Mobility Overal bed mobility: Needs Assistance Bed Mobility: Supine to Sit     Supine to sit: Min assist, HOB elevated, Used rails     General bed mobility comments: Good half bridge to EOB; light mod assist to elevate trunk; no LOB at intiial sitting    Transfers Overall transfer level: Needs assistance Equipment used: Ambulation equipment used, Rolling walker (2 wheels) Transfers: Sit to/from Stand Sit to Stand: Mod assist, Min assist, Contact guard assist           General transfer comment: Stood to stedy from low bed; stedy bar helpful to encourage anterior weight shift; stood x 4 reps from high stedy seat; Stood x2 from recliner seat; Mod assist initially from bed progressing to contact guard assist; multimodal cues for hand palcement Transfer via Lift Equipment: Stedy  Ambulation/Gait Ambulation/Gait assistance: Editor, commissioning (Feet): 28 Feet Assistive device: Rolling walker (2 wheels) Gait Pattern/deviations: Step-to pattern, Decreased stride length, Decreased stance time - left, Decreased weight shift to left       General Gait Details: Much better upright balance; initially with small steps wide BOS (almost festinating), progressed to longer steps with cues and practice   Stairs             Wheelchair Mobility     Tilt Bed    Modified Rankin (Stroke Patients Only)       Balance     Sitting balance-Leahy Scale: Fair       Standing balance-Leahy Scale: Poor  Communication Communication Communication: Impaired Factors Affecting Communication: Hearing impaired  Cognition Arousal: Alert Behavior During Therapy: WFL for tasks assessed/performed   PT - Cognitive impairments: No apparent impairments                       PT - Cognition Comments: Improved from last  PT session; still needing occasoinal cues for sequencing Following commands: Intact      Cueing Cueing Techniques: Verbal cues, Tactile cues, Visual cues  Exercises General Exercises - Lower Extremity Ankle Circles/Pumps: AROM, Both, 15 reps Quad Sets: AROM, Both, 10 reps Heel Slides: AAROM, AROM, Left, 10 reps    General Comments General comments (skin integrity, edema, etc.): NAD on room air      Pertinent Vitals/Pain Pain Assessment Pain Assessment: Faces Faces Pain Scale: Hurts a little bit Pain Location: L Hip Pain Descriptors / Indicators:  ("stinging") Pain Intervention(s): Monitored during session, Premedicated before session    Home Living                          Prior Function            PT Goals (current goals can now be found in the care plan section) Acute Rehab PT Goals Patient Stated Goal: return to walking PT Goal Formulation: With patient Time For Goal Achievement: 07/28/23 Potential to Achieve Goals: Good Progress towards PT goals: Progressing toward goals    Frequency    Min 2X/week      PT Plan      Co-evaluation              AM-PAC PT "6 Clicks" Mobility   Outcome Measure  Help needed turning from your back to your side while in a flat bed without using bedrails?: A Little Help needed moving from lying on your back to sitting on the side of a flat bed without using bedrails?: A Little Help needed moving to and from a bed to a chair (including a wheelchair)?: A Little Help needed standing up from a chair using your arms (e.g., wheelchair or bedside chair)?: A Lot Help needed to walk in hospital room?: A Lot Help needed climbing 3-5 steps with a railing? : Total 6 Click Score: 14    End of Session Equipment Utilized During Treatment: Gait belt Activity Tolerance: Patient tolerated treatment well Patient left: in chair;with call bell/phone within reach Nurse Communication: Mobility status PT Visit Diagnosis:  Unsteadiness on feet (R26.81);Muscle weakness (generalized) (M62.81);Pain Pain - Right/Left: Left Pain - part of body: Hip     Time: 1191-4782 PT Time Calculation (min) (ACUTE ONLY): 47 min  Charges:    $Gait Training: 8-22 mins $Therapeutic Exercise: 8-22 mins $Therapeutic Activity: 8-22 mins                       Van Clines, PT  Acute Rehabilitation Services Office [phone] Secure Chat welcomed    Andrew Tanner 07/17/2023, 2:33 PM

## 2023-07-17 NOTE — Telephone Encounter (Signed)
 Faxed back to [facility], [place].

## 2023-07-17 NOTE — Care Management Important Message (Signed)
 Important Message  Patient Details  Name: Andrew Tanner MRN: [id number] Date of Birth: 1951/03/28   Important Message Given:  Yes - Medicare IM     Dorena Bodo 07/17/2023, 3:29 PM

## 2023-07-18 ENCOUNTER — Other Ambulatory Visit (HOSPITAL_COMMUNITY): Payer: Self-pay

## 2023-07-18 DIAGNOSIS — S72002A Fracture of unspecified part of neck of left femur, initial encounter for closed fracture: Secondary | ICD-10-CM | POA: Diagnosis not present

## 2023-07-18 LAB — GLUCOSE, CAPILLARY
Glucose-Capillary: 217 mg/dL — ABNORMAL HIGH (ref 70–99)
Glucose-Capillary: 300 mg/dL — ABNORMAL HIGH (ref 70–99)
Glucose-Capillary: 378 mg/dL — ABNORMAL HIGH (ref 70–99)
Glucose-Capillary: 252 mg/dL — ABNORMAL HIGH (ref 70–99)

## 2023-07-18 MED ORDER — FERROUS SULFATE 325 (65 FE) MG PO TABS
325.0000 mg | ORAL_TABLET | Freq: Two times a day (BID) | ORAL | 2 refills | Status: DC
  Filled 2023-07-18: qty 60, 30d supply, fill #0

## 2023-07-18 MED ORDER — POLYETHYLENE GLYCOL 3350 17 G PO PACK: 17.0000 g | PACK | Freq: Three times a day (TID) | ORAL | Status: DC | PRN

## 2023-07-18 MED ORDER — SENNOSIDES-DOCUSATE SODIUM 8.6-50 MG PO TABS
1.0000 | ORAL_TABLET | Freq: Two times a day (BID) | ORAL | Status: DC
  Administered 2023-07-18 – 2023-07-19 (×3): 1 via ORAL
  Filled 2023-07-18 (×3): qty 1

## 2023-07-18 MED ORDER — SENNOSIDES-DOCUSATE SODIUM 8.6-50 MG PO TABS
1.0000 | ORAL_TABLET | Freq: Two times a day (BID) | ORAL | 0 refills | Status: DC
  Filled 2023-07-18: qty 10, 5d supply, fill #0

## 2023-07-18 MED ORDER — ASPIRIN 325 MG PO TABS
325.0000 mg | ORAL_TABLET | Freq: Every day | ORAL | 0 refills | Status: AC
  Filled 2023-07-18: qty 45, 45d supply, fill #0

## 2023-07-18 NOTE — Progress Notes (Signed)
 Physical Therapy Treatment Patient Details Name: Andrew Tanner MRN: [id number] DOB: 1951/03/07 Today's Date: 07/18/2023   History of Present Illness Pt is a 73 y.o. male admitted via EMS 07/12/23 for fall at rehab facility. X-ray showed L femoral neck fx. 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,  basal cell carcinoma, diabetes mellitus, diabetic neuropathy, neuromuscular disorder, hyperlipidemia, malignant neuroendocrine tumor of duodenum    PT Comments  Continuing work on functional mobility and activity tolerance;  session focused on more efforts at progressive amb with notable improvements in gait distance and step length; Pt's wife present and helpful; It sounds like pt's ALF he will be returning to will give adequate assist, and he will have consistent PT follow up;    If plan is discharge home, recommend the following: A lot of help with walking and/or transfers;A lot of help with bathing/dressing/bathroom;Assistance with cooking/housework;Direct supervision/assist for medications management;Direct supervision/assist for financial management;Assist for transportation;Help with stairs or ramp for entrance;Supervision due to cognitive status   Can travel by private vehicle     Yes  Equipment Recommendations  None recommended by PT    Recommendations for Other Services       Precautions / Restrictions Precautions Precautions: Fall Recall of Precautions/Restrictions: Impaired Restrictions LLE Weight Bearing Per Provider Order: Weight bearing as tolerated     Mobility  Bed Mobility                    Transfers Overall transfer level: Needs assistance Equipment used: Rolling walker (2 wheels) Transfers: Sit to/from Stand Sit to Stand: Mod assist, From elevated surface           General transfer comment: Light mod assist to stand from recliner; good use of armrests to push from    Ambulation/Gait Ambulation/Gait assistance: Min  assist Gait Distance (Feet): 60 Feet Assistive device: Rolling walker (2 wheels) Gait Pattern/deviations: Step-to pattern, Decreased stride length, Decreased stance time - left, Decreased weight shift to left       General Gait Details: Better gait pattern and step length with cues   Stairs             Wheelchair Mobility     Tilt Bed    Modified Rankin (Stroke Patients Only)       Balance     Sitting balance-Leahy Scale: Fair       Standing balance-Leahy Scale: Poor                              Communication Communication Communication: Impaired Factors Affecting Communication: Hearing impaired  Cognition Arousal: Alert Behavior During Therapy: WFL for tasks assessed/performed                             Following commands: Intact      Cueing Cueing Techniques: Verbal cues, Tactile cues, Visual cues  Exercises      General Comments General comments (skin integrity, edema, etc.): Family present for session      Pertinent Vitals/Pain Pain Assessment Pain Assessment: Faces Faces Pain Scale: Hurts a little bit Pain Location: L Hip Pain Intervention(s): Monitored during session    Home Living                          Prior Function  PT Goals (current goals can now be found in the care plan section) Acute Rehab PT Goals Patient Stated Goal: return to walking PT Goal Formulation: With patient Time For Goal Achievement: 07/28/23 Potential to Achieve Goals: Good Progress towards PT goals: Progressing toward goals    Frequency    Min 2X/week      PT Plan      Co-evaluation              AM-PAC PT "6 Clicks" Mobility   Outcome Measure  Help needed turning from your back to your side while in a flat bed without using bedrails?: A Little Help needed moving from lying on your back to sitting on the side of a flat bed without using bedrails?: A Little Help needed moving to and from a bed  to a chair (including a wheelchair)?: A Little Help needed standing up from a chair using your arms (e.g., wheelchair or bedside chair)?: A Little Help needed to walk in hospital room?: A Little Help needed climbing 3-5 steps with a railing? : Total 6 Click Score: 16    End of Session Equipment Utilized During Treatment: Gait belt Activity Tolerance: Patient tolerated treatment well Patient left: in chair;with call bell/phone within reach Nurse Communication: Mobility status PT Visit Diagnosis: Unsteadiness on feet (R26.81);Muscle weakness (generalized) (M62.81);Pain Pain - Right/Left: Left Pain - part of body: Hip     Time: 1245-1317 PT Time Calculation (min) (ACUTE ONLY): 32 min  Charges:    $Gait Training: 23-37 mins PT General Charges $$ ACUTE PT VISIT: 1 Visit                     Van Clines, PT  Acute Rehabilitation Services Office [phone] Secure Chat welcomed    Levi Aland 07/18/2023, 2:57 PM

## 2023-07-18 NOTE — Progress Notes (Signed)
    Durable Medical Equipment  (From admission, onward)           Start     Ordered   07/18/23 1126  For home use only DME Bedside commode  Once       Question:  Patient needs a bedside commode to treat with the following condition  Answer:  Gait instability   07/18/23 1127   07/18/23 1126  For home use only DME lightweight manual wheelchair with seat cushion  Once       Comments: Patient suffers from s/p L THA which impairs their ability to perform daily activities like bathing in the home.  A walker will not resolve  issue with performing activities of daily living. A wheelchair will allow patient to safely perform daily activities. Patient is not able to propel themselves in the home using a standard weight wheelchair due to general weakness. Patient can self propel in the lightweight wheelchair. Length of need 6 months . Accessories: elevating leg rests (ELRs), wheel locks, extensions and anti-tippers.   07/18/23 1127   07/18/23 1124  For home use only DME Hospital bed  Once       Question Answer Comment  Length of Need 6 Months   Patient has (list medical condition): hx of recent L femoral neck fx. 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

## 2023-07-18 NOTE — Progress Notes (Signed)
 Occupational Therapy Treatment Patient Details Name: Andrew Tanner MRN: [id number] DOB: [birth year] Today's Date: 07/18/2023   History of present illness Pt is a 73 y.o. male admitted via EMS 07/12/23 for fall at rehab facility. X-ray showed L femoral neck fx. 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 Education provided on compensatory technique for LB dressing. Pt return demo with mod assist to don pants. SPT progressed to CGA with RW. Per case management note, plan is to d/c home with [facility] services. DME recommendations below. Will continue to follow acutely.       If plan is discharge home, recommend the following:  A lot of help with walking and/or transfers;A lot of help with bathing/dressing/bathroom   Equipment Recommendations  BSC/3in1;Tub/shower seat       Precautions / Restrictions Precautions Precautions: Fall Recall of Precautions/Restrictions: Impaired Restrictions Weight Bearing Restrictions Per Provider Order: Yes LLE Weight Bearing Per Provider Order: Weight bearing as tolerated       Mobility Bed Mobility Overal bed mobility: Needs Assistance Bed Mobility: Supine to Sit     Supine to sit: Contact guard, HOB elevated, Used rails     General bed mobility comments: Use of bed features    Transfers Overall transfer level: Needs assistance Equipment used: Rolling walker (2 wheels) Transfers: Sit to/from Stand, Bed to chair/wheelchair/BSC Sit to Stand: Mod assist, From elevated surface Stand pivot transfers: Contact guard assist         General transfer comment: Mod assist to lift     Balance Overall balance assessment: Needs assistance Sitting-balance support: Feet supported, Bilateral upper extremity supported Sitting balance-Leahy Scale:  Fair Sitting balance - Comments: LOB while trying to don pants seated, unable to self correct Postural control: Posterior lean Standing balance support: Bilateral upper extremity supported, During functional activity, Reliant on assistive device for balance Standing balance-Leahy Scale: Poor Standing balance comment: Reliant on RW, progressive posterior lean with standing, needs cues to correct                           ADL either performed or assessed with clinical judgement   ADL Overall ADL's : Needs assistance/impaired Eating/Feeding: Set up;Sitting                   Lower Body Dressing: Moderate assistance;Sit to/from stand;Cueing for compensatory techniques Lower Body Dressing Details (indicate cue type and reason): Cueing for sequencing, assist to pull over waist in standing Toilet Transfer: Contact guard assist;Stand-pivot;Rolling walker (2 wheels) Toilet Transfer Details (indicate cue type and reason): Simulated in room         Functional mobility during ADLs: Contact guard assist;Rolling walker (2 wheels) General ADL Comments: Cueing for first in last out technique, good recall during session    Extremity/Trunk Assessment Upper Extremity Assessment Upper Extremity Assessment: Overall WFL for tasks assessed;RUE deficits/detail;LUE deficits/detail RUE Deficits / Details: PMH: neuromuscular disorder, tremors RUE Sensation: history of peripheral neuropathy RUE Coordination: decreased fine motor LUE Deficits / Details: PMH: neuromuscular disorder, tremors LUE Sensation: history of peripheral neuropathy LUE Coordination: decreased fine motor   Lower Extremity Assessment Lower Extremity Assessment: Defer to PT evaluation        Vision   Vision Assessment?: No apparent visual deficits         Communication Communication  Communication: Impaired Factors Affecting Communication: Hearing impaired   Cognition Arousal: Alert Behavior During Therapy:  WFL for tasks assessed/performed Cognition: No apparent impairments           Following commands: Intact        Cueing   Cueing Techniques: Verbal cues, Tactile cues, Visual cues        General Comments Family present for session    Pertinent Vitals/ Pain       Pain Assessment Pain Assessment: Faces Faces Pain Scale: Hurts a little bit Pain Location: L Hip Pain Intervention(s): Monitored during session, Repositioned   Frequency  Min 2X/week        Progress Toward Goals  OT Goals(current goals can now be found in the care plan section)  Progress towards OT goals: Progressing toward goals  Acute Rehab OT Goals Patient Stated Goal: None stated OT Goal Formulation: With patient Time For Goal Achievement: 07/28/23 Potential to Achieve Goals: Good ADL Goals Pt Will Perform Lower Body Dressing: with min assist;sit to/from stand Pt Will Transfer to Toilet: with mod assist;stand pivot transfer;bedside commode Pt Will Perform Toileting - Clothing Manipulation and hygiene: with min assist;sit to/from stand  Plan         AM-PAC OT "6 Clicks" Daily Activity     Outcome Measure   Help from another person eating meals?: None Help from another person taking care of personal grooming?: A Little Help from another person toileting, which includes using toliet, bedpan, or urinal?: A Lot Help from another person bathing (including washing, rinsing, drying)?: A Lot Help from another person to put on and taking off regular upper body clothing?: A Little Help from another person to put on and taking off regular lower body clothing?: A Lot 6 Click Score: 16    End of Session Equipment Utilized During Treatment: Gait belt;Rolling walker (2 wheels)  OT Visit Diagnosis: Unsteadiness on feet (R26.81);Other abnormalities of gait and mobility (R26.89);Muscle weakness (generalized) (M62.81);History of falling (Z91.81)   Activity Tolerance Patient tolerated treatment well   Patient  Left in chair;with call bell/phone within reach;with chair alarm set;with family/visitor present   Nurse Communication Mobility status        Time: 2130-8657 OT Time Calculation (min): 16 min  Charges: OT General Charges $OT Visit: 1 Visit OT Treatments $Self Care/Home Management : 8-22 mins  Ivor Messier, OT  Acute Rehabilitation Services Office [phone] Secure chat preferred   Marilynne Drivers 07/18/2023, 12:59 PM

## 2023-07-18 NOTE — Progress Notes (Signed)
    Durable Medical Equipment  (From admission, onward)           Start     Ordered   07/18/23 1313  For home use only DME Bedside commode  Once       Comments: Confine to room  Question:  Patient needs a bedside commode to treat with the following condition  Answer:  Gait instability   07/18/23 1312   07/18/23 1126  For home use only DME lightweight manual wheelchair with seat cushion  Once       Comments: Patient suffers from s/p L THA which impairs their ability to perform daily activities like bathing in the home.  A walker will not resolve  issue with performing activities of daily living. A wheelchair will allow patient to safely perform daily activities. Patient is not able to propel themselves in the home using a standard weight wheelchair due to general weakness. Patient can self propel in the lightweight wheelchair. Length of need 6 months . Accessories: elevating leg rests (ELRs), wheel locks, extensions and anti-tippers.   07/18/23 1127   07/18/23 1124  For home use only DME Hospital bed  Once       Question Answer Comment  Length of Need 6 Months   Patient has (list medical condition): hx of recent L femoral neck fx. 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

## 2023-07-18 NOTE — Plan of Care (Signed)
  Problem: Education: Goal: Ability to describe self-care measures that may prevent or decrease complications (Diabetes Survival Skills Education) will improve Outcome: Progressing   Problem: Coping: Goal: Ability to adjust to condition or change in health will improve Outcome: Progressing   Problem: Fluid Volume: Goal: Ability to maintain a balanced intake and output will improve Outcome: Completed/Met   Problem: Health Behavior/Discharge Planning: Goal: Ability to identify and utilize available resources and services will improve Outcome: Completed/Met Goal: Ability to manage health-related needs will improve Outcome: Completed/Met   Problem: Metabolic: Goal: Ability to maintain appropriate glucose levels will improve Outcome: Completed/Met   Problem: Nutritional: Goal: Maintenance of adequate nutrition will improve Outcome: Progressing Goal: Progress toward achieving an optimal weight will improve Outcome: Progressing   Problem: Skin Integrity: Goal: Risk for impaired skin integrity will decrease Outcome: Progressing   Problem: Tissue Perfusion: Goal: Adequacy of tissue perfusion will improve Outcome: Progressing

## 2023-07-18 NOTE — TOC Progression Note (Signed)
 Transition of Care [facility]) - Progression Note    Patient Details  Name: Andrew Tanner MRN: [id number] Date of Birth: Apr 25, 1950  [facility]) CM/SW Contact  Epifanio Lesches, RN Phone Number: 07/18/2023, 12:25 PM  Clinical Narrative:    [facility] director, [phone]) with the resumption of home health services   once DME ( RW, W/C and hospital bed) have been delivered to home. Referral made with Zach/ [employer] for DME . Adele Dan /[facility] made aware of pt's d/c plan. TOC team following and will continue assisting with needs...  Expected Discharge Plan: Home w Home Health Services Barriers to Discharge: Other (must enter comment) (DME delivery)  Expected Discharge Plan and Services In-house Referral: Clinical Social Work   Post Acute Care Choice:  (TBD) Living arrangements for the past 2 months: Group Home Expected Discharge Date: 07/18/23               DME Arranged: Hospital bed, Lightweight manual wheelchair with seat cushion, Bedside commode   Date DME Agency Contacted: 07/18/23 Time DME Agency Contacted: 1210   HH Arranged: PT, OT, Speech Therapy HH Agency: [facility] Date HH Agency Contacted: 07/18/23 Time HH Agency Contacted: 1210 Representative spoke with at [facility] Agency: [facility]   Social Determinants of Health (SDOH) Interventions SDOH Screenings   Food Insecurity: No Food Insecurity (07/13/2023)  Housing: Low Risk  (07/13/2023)  Transportation Needs: No Transportation Needs (07/13/2023)  Utilities: Not At Risk (07/13/2023)  Alcohol Screen: Low Risk  (10/19/2020)  Depression (PHQ2-9): Low Risk  (03/20/2023)  Financial Resource Strain: Low Risk  (03/16/2023)  Physical Activity: Sufficiently Active (03/16/2023)  Social Connections: Socially Integrated (07/13/2023)  Stress: No Stress Concern Present (03/16/2023)  Tobacco Use: Medium Risk (07/13/2023)  Health Literacy: Adequate Health  Literacy (01/03/2023)    Readmission Risk Interventions    05/15/2023    8:24 AM  Readmission Risk Prevention Plan  Transportation Screening Complete  PCP or Specialist Appt within 3-5 Days Complete  HRI or Home Care Consult Complete

## 2023-07-18 NOTE — Discharge Summary (Signed)
 Physician Discharge Summary  Andrew Tanner ZOX:[id number] DOB: 08-Mar-1951 DOA: 07/12/2023  PCP: Jeani Sow, MD  Admit date: 07/12/2023 Discharge date: 07/18/2023  Admitted From: Group home Disposition: Same  Recommendations for Outpatient Follow-up:  Follow up with PCP in 1-2 weeks Follow-up with orthopedic surgery in 1 to 2 weeks as scheduled  Home Health: Home health PT OT Equipment/Devices: Bedside commode, hospital bed, wheelchair with seat cushion  Discharge Condition: Stable CODE STATUS: Full Diet recommendation: Low-salt low-fat diet  Brief/Interim Summary: Andrew Tanner is a 73 y.o. male with medical history significant for type 2 diabetes, hyperlipidemia, moderate protein calorie malnutrition, who presents to the ER after mechanical fall -imaging confirms left femoral neck fracture, hospitalist called for admission.  Orthopedics called in consult.   Patient mated as above with acute left femoral neck fracture, status post ORIF on 07/13/2023 with orthopedic surgery Dr. Eulah Pont.  Patient tolerated quite well, up and ambulating over the past few days and has markedly improved since admission.  Plan now for discharge back to previous facility to continue home health therapy with included durable medical equipment as below.  Otherwise medication changes as below, remains medically stable and agreeable for discharge home.  Discharge Diagnoses:  Principal Problem:   Fracture of femoral neck, left (HCC)  Left femoral neck fracture, seen on x-ray, post mechanical fall, POA Presumed osteoporosis given fracture from standing height, POA Pain currently well-controlled N.p.o. until seen by orthopedic surgery, Dr. Eulah Pont. Status post ORIF 07/13/2023 - tolerated well Continue Vit D supplementation with diet versus over-the-counter vitamins   Acute likely blood loss anemia on chronic anemia of iron deficiency and malnutrition  -Iron panel remarkably low, initiate iron supplementation  (IV iron sucrose x1 -p.o. iron twice daily initiated) -Status post 2 unit PRBC (4/5), repeat hemoglobin trending upwards, currently 9.6 - no longer following -No signs or symptoms of bleeding, no notable ecchymosis petechiae, platelets minimally depressed but not significantly.  -Continue iron supplementation with diet and OTC vitamins   Type 2 diabetes uncontrolled with hyperglycemia Hemoglobin A1c 6.8 on 04/21/2023 Resume home medication, discussed diet   Moderate protein calorie malnutrition Albumin 2.4, moderate muscle mass loss Encourage oral protein calorie intake post operatively   Hyperlipidemia Continue statin   Elevated BP, likely contributed by pain No diagnosis of hypertension   Non anion gap metabolic acidosis, mild, resolved Resolved with IV fluids  Discharge Instructions  Discharge Instructions     Call MD for:  difficulty breathing, headache or visual disturbances   Complete by: As directed    Call MD for:  extreme fatigue   Complete by: As directed    Call MD for:  hives   Complete by: As directed    Call MD for:  persistant dizziness or light-headedness   Complete by: As directed    Call MD for:  persistant nausea and vomiting   Complete by: As directed    Call MD for:  redness, tenderness, or signs of infection (pain, swelling, redness, odor or green/yellow discharge around incision site)   Complete by: As directed    Call MD for:  severe uncontrolled pain   Complete by: As directed    Call MD for:  temperature >100.4   Complete by: As directed    Diet - low sodium heart healthy   Complete by: As directed    Increase activity slowly   Complete by: As directed       Allergies as of 07/18/2023   No Known Allergies  Medication List     TAKE these medications    acetaminophen 500 MG tablet Commonly known as: TYLENOL Take 2 tablets (1,000 mg total) by mouth every 6 (six) hours as needed for mild pain (pain score 1-3).   alfuzosin 10 MG 24  hr tablet Commonly known as: UROXATRAL Take 1 tablet (10 mg total) by mouth daily with breakfast. What changed: when to take this   aspirin 325 MG tablet Take 1 tablet (325 mg total) by mouth daily.   chlorhexidine 4 % external liquid Commonly known as: HIBICLENS Apply 15 mLs (1 Application total) topically as directed for 30 doses. Use as directed daily for 5 days every other week for 6 weeks.   clonazePAM 0.5 MG tablet Commonly known as: KLONOPIN Take 1.5 mg by mouth at bedtime.   Dexcom G7 Sensor Misc Use to monitor glucose continuously, change every 10 days   ferrous sulfate 325 (65 FE) MG tablet Take 1 tablet (325 mg total) by mouth 2 (two) times daily with a meal.   FLUoxetine 20 MG capsule Commonly known as: PROZAC Take 20 mg by mouth daily.   glucose blood test strip 1 each by Other route in the morning and at bedtime. Use as instructed checking once per day, or if new symptoms.   hydrOXYzine 50 MG tablet Commonly known as: ATARAX Take 50 mg by mouth at bedtime as needed (sleep/anxiety.).   insulin aspart 100 UNIT/ML injection Commonly known as: novoLOG Inject 4 Units into the skin 3 (three) times daily with meals. What changed: additional instructions   Insulin Pen Needle 32G X 4 MM Misc Use 2x a day   ketoconazole 2 % shampoo Commonly known as: NIZORAL APPLY TO SCALP AND FACE 2 TO 3 TIMES A WEEK. What changed:  how much to take how to take this when to take this additional instructions   Lantus SoloStar 100 UNIT/ML Solostar Pen Generic drug: insulin glargine Inject 12 Units into the skin at bedtime.   Creon 36000 UNITS Cpep capsule Generic drug: lipase/protease/amylase Take 36,000 Units by mouth every 12 (twelve) hours as needed (pancreatic insufficiency in between meals with snacks).   lipase/protease/amylase 16109 UNITS Cpep capsule Commonly known as: CREON Take 2 capsules (72,000 Units total) by mouth 3 (three) times daily before meals.    megestrol 40 MG/ML suspension Commonly known as: MEGACE Take 10 mLs (400 mg total) by mouth daily.   methocarbamol 500 MG tablet Commonly known as: ROBAXIN Take 1 tablet (500 mg total) by mouth 4 (four) times daily. What changed:  when to take this reasons to take this   mupirocin ointment 2 % Commonly known as: BACTROBAN Place 1 Application into the nose 2 (two) times daily for 60 doses. Use as directed 2 times daily for 5 days every other week for 6 weeks.   oxyCODONE 5 MG immediate release tablet Commonly known as: Oxy IR/ROXICODONE Take 1 tablet (5 mg total) by mouth every 4 (four) hours as needed for severe pain (pain score 7-10), breakthrough pain or moderate pain (pain score 4-6). What changed: Another medication with the same name was removed. Continue taking this medication, and follow the directions you see here.   pantoprazole 40 MG tablet Commonly known as: PROTONIX Take 1 tablet (40 mg total) by mouth at bedtime.   polyethylene glycol 17 g packet Commonly known as: MIRALAX / GLYCOLAX Take 17 g by mouth daily as needed (constipation.).   prochlorperazine 10 MG tablet Commonly known as: COMPAZINE Take 1  tablet (10 mg total) by mouth every 6 (six) hours as needed for nausea or vomiting (Use for nausea and / or vomiting unresolved with ondansetron (Zofran).).   rosuvastatin 20 MG tablet Commonly known as: Crestor Take 1 tablet (20 mg total) by mouth daily. What changed: when to take this   senna-docusate 8.6-50 MG tablet Commonly known as: Senokot-S Take 1 tablet by mouth 2 (two) times daily.   simethicone 80 MG chewable tablet Commonly known as: MYLICON Chew 1 tablet (80 mg total) by mouth every 6 (six) hours as needed for flatulence.   UNABLE TO FIND Take 1 tablet by mouth daily. 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  A walker will not resolve  issue with performing activities of daily living. A wheelchair will allow patient to safely perform daily activities. Patient is not able to propel themselves in the home using a standard weight wheelchair due to general weakness. Patient can self propel in the lightweight wheelchair. Length of need 6 months . Accessories: elevating leg rests (ELRs), wheel locks, extensions and anti-tippers.   07/18/23 1127   07/18/23 1124  For home use only DME Hospital bed  Once       Question Answer Comment  Length of Need 6 Months   Patient has (list medical condition): hx of recent L femoral neck fx. 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 Schedule an appointment as soon as possible for a visit in 2 week(s).   Specialty: Orthopedic Surgery Why: for wound check Contact information: [address] [place] [address] [phone]                No Known Allergies  Consultations: Orthopedic surgery  Procedures/Studies: DG HIP PORT UNILAT W OR W/O PELVIS 1V LEFT Result Date: 07/13/2023 CLINICAL DATA:  Postop in PACU. EXAM: DG HIP (WITH OR WITHOUT PELVIS)  1V PORT LEFT COMPARISON:  Preoperative imaging FINDINGS: Left hip arthroplasty in expected alignment. No periprosthetic lucency or fracture. Recent postsurgical change includes air and edema in the soft tissues. IMPRESSION: Left hip arthroplasty without immediate postoperative complication. Electronically Signed   By: Narda Rutherford M.D.   On: 07/13/2023 16:27   DG HIP UNILAT WITH PELVIS 1V LEFT Result Date: 07/13/2023 CLINICAL DATA:  Elective surgery EXAM: DG HIP (WITH OR WITHOUT PELVIS) 1V*L*  COMPARISON:  Preoperative imaging FINDINGS: Seven fluoroscopic spot views of the pelvis and left hip obtained in the operating room. Sequential images during hip arthroplasty. Fluoroscopy time 55 seconds. Dose 5.404 mGy. IMPRESSION: Intraoperative fluoroscopy during left hip arthroplasty. Electronically Signed   By: Narda Rutherford M.D.   On: 07/13/2023 16:21   DG C-Arm 1-60 Min-No Report Result Date: 07/13/2023 Fluoroscopy was utilized by the requesting physician.  No radiographic interpretation.   DG C-Arm 1-60 Min-No Report Result Date: 07/13/2023 Fluoroscopy was utilized by the requesting physician.  No radiographic interpretation.   DG C-Arm 1-60 Min-No Report Result Date: 07/13/2023 Fluoroscopy was utilized by the requesting physician.  No radiographic interpretation.   DG C-Arm 1-60 Min-No Report Result Date: 07/13/2023 Fluoroscopy was utilized by the requesting physician.  No radiographic interpretation.   CT HIP LEFT WO CONTRAST Result Date: 07/13/2023 CLINICAL DATA:  Left hip fracture. EXAM: CT OF THE LEFT HIP WITHOUT CONTRAST TECHNIQUE: Multidetector CT imaging of the left hip was performed according to the standard protocol. Multiplanar CT image reconstructions were also generated. RADIATION DOSE REDUCTION: This exam was performed according to the departmental dose-optimization program which includes automated exposure control, adjustment of the mA and/or kV according to patient size and/or use of  iterative reconstruction technique. COMPARISON:  AP pelvis and left hip films yesterday, CT abdomen pelvis with contrast 05/30/2023. FINDINGS: Bones/Joint/Cartilage There is mild osteopenia. There is an acute basicervical transverse oblique distal left femoral neck fracture, with comminution and mild impaction at the fracture site, and about 1 cortex width of posterior and lateral displacement of the main distal fragment as well as mild external rotation of the distal fragment. There is no further evidence fractures. There is mild narrowing and spurring at the hip joint, pubic symphysis and left SI joint. Degenerative disc disease and spondylosis L5-S1. Ligaments Suboptimally assessed by CT. Muscles and Tendons There is swelling in the lateral aspect of the left gluteus maximus muscle most likely a due to direct trauma. There is patchy fluid in between muscle groups in the visualized anterior proximal thigh. No space-occupying intramuscular hematoma is seen. There is a grossly normal muscle bulk for age. No acute tendon findings. Soft tissues Partial visualization of enlarged prostate impressing into the bladder base. Sigmoid diverticulosis is noted without evidence of diverticulitis. In the left pelvis no free fluid, free air or free hemorrhage is seen. There is no space-occupying hematoma in the visualized upper thigh. No mass. IMPRESSION: 1. Acute closed basicervical transverse oblique distal left femoral neck fracture with comminution and mild impaction at the fracture site, and about 1 cortex width of posterior and lateral displacement of the main distal fragment as well as mild external rotation of the distal fragment. 2. Osteopenia and degenerative change. 3. Swelling in the lateral aspect of the left gluteus maximus muscle most likely due to direct trauma. 4. Patchy fluid in between muscle groups in the visualized anterior proximal thigh. No space-occupying hematoma is seen. 5. Enlarged prostate impressing  into the bladder base. 6. Sigmoid diverticulosis. Electronically Signed   By: Almira Bar M.D.   On: 07/13/2023 06:43   DG Hip Unilat With Pelvis 2-3 Views Left Result Date: 07/12/2023 CLINICAL DATA:  Fall, left hip pain EXAM: DG HIP (WITH OR WITHOUT PELVIS) 2-3V LEFT COMPARISON:  None Available. FINDINGS: There is a left femoral neck fracture. No significant angulation. No subluxation or dislocation. Hip joints and SI joints symmetric. IMPRESSION: Left femoral neck fracture. Electronically Signed   By: Charlett Nose  M.D.   On: 07/12/2023 22:02   CT Head Wo Contrast Result Date: 06/26/2023 CLINICAL DATA:  Trauma, brought by EMS from rehab, JP drain dislodged. EXAM: CT HEAD WITHOUT CONTRAST CT CERVICAL SPINE WITHOUT CONTRAST TECHNIQUE: Multidetector CT imaging of the head and cervical spine was performed following the standard protocol without intravenous contrast. Multiplanar CT image reconstructions of the cervical spine were also generated. RADIATION DOSE REDUCTION: This exam was performed according to the departmental dose-optimization program which includes automated exposure control, adjustment of the mA and/or kV according to patient size and/or use of iterative reconstruction technique. COMPARISON:  06/20/2023 FINDINGS: CT HEAD FINDINGS Brain: No acute intracranial hemorrhage. No CT evidence of acute infarct. 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 Hypodense foci in the inferior aspect of the basal ganglia, likely prominent perivascular spaces. Similar appearance of sellar mass measuring up to 1.8 cm. No edema, mass effect, or midline shift. The basilar cisterns are patent. Ventricles: Prominence of the ventricles suggesting underlying parenchymal volume loss. Vascular: Atherosclerotic calcifications of the carotid siphons. No hyperdense vessel. Skull: No acute or aggressive finding. Orbits: Orbits are symmetric. Sinuses: Mild mucosal thickening in the maxillary sinuses. Other:  Mastoid air cells are clear. Small right frontal scalp hematoma. CT CERVICAL SPINE FINDINGS Alignment: Straightening of the normal cervical lordosis. No listhesis. No facet subluxation or dislocation. Skull base and vertebrae: No compression fracture or displaced fracture in the cervical spine. Anterior cervical fusion hardware from C4-C6. Additional plate and screw hardware along the posterior elements on the left from C3 to C7. No suspicious osseous lesion. Soft tissues and spinal canal: No prevertebral fluid or swelling. No visible canal hematoma. Redemonstrated left thyroid nodules measuring greater than 2 cm in diameter. Disc levels: Mild intervertebral disc space narrowing at C3-4 and C6-7 with associated degenerative endplate changes. Disc bulges and disc osteophyte complexes at multiple levels. No high-grade osseous spinal canal stenosis. Facet arthrosis at multiple levels. Foraminal narrowing at multiple levels throughout the cervical spine greatest at C3-4 and C6-7. Upper chest: Negative. Other: None. IMPRESSION: No CT evidence of acute intracranial abnormality. Small right frontal scalp hematoma. Unchanged pituitary/sellar mass. No acute fracture or traumatic malalignment in the cervical spine. Degenerative changes and postsurgical changes as above. Similar appearance of left thyroid nodule. Reiterate recommendation for thyroid ultrasound if not previously performed. Electronically Signed   By: Emily Filbert M.D.   On: 06/26/2023 09:33   CT Cervical Spine Wo Contrast Result Date: 06/26/2023 CLINICAL DATA:  Trauma, brought by EMS from rehab, JP drain dislodged. EXAM: CT HEAD WITHOUT CONTRAST CT CERVICAL SPINE WITHOUT CONTRAST TECHNIQUE: Multidetector CT imaging of the head and cervical spine was performed following the standard protocol without intravenous contrast. Multiplanar CT image reconstructions of the cervical spine were also generated. RADIATION DOSE REDUCTION: This exam was performed according  to the departmental dose-optimization program which includes automated exposure control, adjustment of the mA and/or kV according to patient size and/or use of iterative reconstruction technique. COMPARISON:  06/20/2023 FINDINGS: CT HEAD FINDINGS Brain: No acute intracranial hemorrhage. No CT evidence of acute infarct. 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 Hypodense foci in the inferior aspect of the basal ganglia, likely prominent perivascular spaces. Similar appearance of sellar mass measuring up to 1.8 cm. No edema, mass effect, or midline shift. The basilar cisterns are patent. Ventricles: Prominence of the ventricles suggesting underlying parenchymal volume loss. Vascular: Atherosclerotic calcifications of the carotid siphons. No hyperdense vessel. Skull: No acute or aggressive finding.  Orbits: Orbits are symmetric. Sinuses: Mild mucosal thickening in the maxillary sinuses. Other: Mastoid air cells are clear. Small right frontal scalp hematoma. CT CERVICAL SPINE FINDINGS Alignment: Straightening of the normal cervical lordosis. No listhesis. No facet subluxation or dislocation. Skull base and vertebrae: No compression fracture or displaced fracture in the cervical spine. Anterior cervical fusion hardware from C4-C6. Additional plate and screw hardware along the posterior elements on the left from C3 to C7. No suspicious osseous lesion. Soft tissues and spinal canal: No prevertebral fluid or swelling. No visible canal hematoma. Redemonstrated left thyroid nodules measuring greater than 2 cm in diameter. Disc levels: Mild intervertebral disc space narrowing at C3-4 and C6-7 with associated degenerative endplate changes. Disc bulges and disc osteophyte complexes at multiple levels. No high-grade osseous spinal canal stenosis. Facet arthrosis at multiple levels. Foraminal narrowing at multiple levels throughout the cervical spine greatest at C3-4 and C6-7. Upper chest: Negative. Other: None.  IMPRESSION: No CT evidence of acute intracranial abnormality. Small right frontal scalp hematoma. Unchanged pituitary/sellar mass. No acute fracture or traumatic malalignment in the cervical spine. Degenerative changes and postsurgical changes as above. Similar appearance of left thyroid nodule. Reiterate recommendation for thyroid ultrasound if not previously performed. Electronically Signed   By: Emily Filbert M.D.   On: 06/26/2023 09:33   CT Head Wo Contrast Result Date: 06/20/2023 CLINICAL DATA:  Provided history: Fall in ER. Head trauma, GCS = 15, severe headache. Head CT 11/09/2022. EXAM: CT HEAD WITHOUT CONTRAST CT CERVICAL SPINE WITHOUT CONTRAST TECHNIQUE: Multidetector CT imaging of the head and cervical spine was performed following the standard protocol without intravenous contrast. Multiplanar CT image reconstructions of the cervical spine were also generated. RADIATION DOSE REDUCTION: This exam was performed according to the departmental dose-optimization program which includes automated exposure control, adjustment of the mA and/or kV according to patient size and/or use of iterative reconstruction technique. COMPARISON:  Head CT 05/13/2023.  Cervical spine CT 05/13/2023. FINDINGS: CT HEAD FINDINGS Brain: Generalized cerebral atrophy. Unchanged small hypodense focus within the left basal ganglia inferiorly, likely reflecting a prominent perivascular space. 2 cm pituitary mass, unchanged from the recent prior head CT of 05/13/2023 (remeasured on prior). 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 There is no acute intracranial hemorrhage. No demarcated cortical infarct. No extra-axial fluid collection. No midline shift. Vascular: No hyperdense vessel.  Atherosclerotic calcifications. Skull: No calvarial fracture or aggressive osseous lesion. Sinuses/Orbits: No mass or acute finding within the imaged orbits. Minimal mucosal thickening scattered within the paranasal  sinuses at the imaged levels. CT CERVICAL SPINE FINDINGS Alignment: No significant spondylolisthesis. Skull base and vertebrae: The basion-dental and atlanto-dental intervals are maintained.No evidence of acute fracture to the cervical spine. Prior C4-C6 ACDF. Solid bridging osseous fusion across the C4-C5 and C5-C6 disc spaces. Post laminotomy changes at C3, C4, C5, C6 and C7. Prior plate and screw fixation of the left C3, C4, C5, C6 and C7 laminae. Soft tissues and spinal canal: No prevertebral fluid or swelling. No visible canal hematoma. Disc levels: Postoperative changes to the cervical spine as described above. Superimposed cervical spondylosis with multilevel disc space narrowing, disc bulges, posterior disc osteophyte complexes, uncovertebral hypertrophy and facet arthropathy. No appreciable high-grade spinal canal stenosis. Multilevel bony neural from narrowing. Degenerative changes also present at the C1-C2 articulation. Upper chest: No consolidation within the imaged lung apices. No visible pneumothorax. Other: Left thyroid lobe nodules measuring up to 2.7 cm. IMPRESSION: CT head: 1.  No evidence of an  acute intracranial abnormality. 2. 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 A non-emergent pituitary protocol brain MRI (with and without contrast) is again recommended for further evaluation. 3. 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 4. Cerebral atrophy. CT cervical spine: 1. No evidence of an acute cervical spine fracture. 2. Cervical spondylosis and postoperative changes, as described. 3. Left thyroid lobe nodules measuring up to 2.7 cm. A nonemergent thyroid ultrasound is recommended for further evaluation. Reference: J Am Coll Radiol. 2015 Feb;12(2): 143-50. Electronically Signed   By: Jackey Loge D.O.   On: 06/20/2023 21:59   CT CERVICAL SPINE WO CONTRAST Result Date: 06/20/2023 CLINICAL DATA:  Provided history: Fall in ER. Head  trauma, GCS = 15, severe headache. Head CT 11/09/2022. EXAM: CT HEAD WITHOUT CONTRAST CT CERVICAL SPINE WITHOUT CONTRAST TECHNIQUE: Multidetector CT imaging of the head and cervical spine was performed following the standard protocol without intravenous contrast. Multiplanar CT image reconstructions of the cervical spine were also generated. RADIATION DOSE REDUCTION: This exam was performed according to the departmental dose-optimization program which includes automated exposure control, adjustment of the mA and/or kV according to patient size and/or use of iterative reconstruction technique. COMPARISON:  Head CT 05/13/2023.  Cervical spine CT 05/13/2023. FINDINGS: CT HEAD FINDINGS Brain: Generalized cerebral atrophy. Unchanged small hypodense focus within the left basal ganglia inferiorly, likely reflecting a prominent perivascular space. 2 cm pituitary mass, unchanged from the recent prior head CT of 05/13/2023 (remeasured on prior). 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 There is no acute intracranial hemorrhage. No demarcated cortical infarct. No extra-axial fluid collection. No midline shift. Vascular: No hyperdense vessel.  Atherosclerotic calcifications. Skull: No calvarial fracture or aggressive osseous lesion. Sinuses/Orbits: No mass or acute finding within the imaged orbits. Minimal mucosal thickening scattered within the paranasal sinuses at the imaged levels. CT CERVICAL SPINE FINDINGS Alignment: No significant spondylolisthesis. Skull base and vertebrae: The basion-dental and atlanto-dental intervals are maintained.No evidence of acute fracture to the cervical spine. Prior C4-C6 ACDF. Solid bridging osseous fusion across the C4-C5 and C5-C6 disc spaces. Post laminotomy changes at C3, C4, C5, C6 and C7. Prior plate and screw fixation of the left C3, C4, C5, C6 and C7 laminae. Soft tissues and spinal canal: No prevertebral fluid or swelling. No visible canal  hematoma. Disc levels: Postoperative changes to the cervical spine as described above. Superimposed cervical spondylosis with multilevel disc space narrowing, disc bulges, posterior disc osteophyte complexes, uncovertebral hypertrophy and facet arthropathy. No appreciable high-grade spinal canal stenosis. Multilevel bony neural from narrowing. Degenerative changes also present at the C1-C2 articulation. Upper chest: No consolidation within the imaged lung apices. No visible pneumothorax. Other: Left thyroid lobe nodules measuring up to 2.7 cm. IMPRESSION: CT head: 1.  No evidence of an acute intracranial abnormality. 2. 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 A non-emergent pituitary protocol brain MRI (with and without contrast) is again recommended for further evaluation. 3. 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 4. Cerebral atrophy. CT cervical spine: 1. No evidence of an acute cervical spine fracture. 2. Cervical spondylosis and postoperative changes, as described. 3. Left thyroid lobe nodules measuring up to 2.7 cm. A nonemergent thyroid ultrasound is recommended for further evaluation. Reference: J Am Coll Radiol. 2015 Feb;12(2): 143-50. Electronically Signed   By: Jackey Loge D.O.   On: 06/20/2023 21:59     Subjective: No acute issues  or events overnight   Discharge Exam: Vitals:   07/18/23 0658 07/18/23 0808  BP: (!) 157/82 (!) 144/78  Pulse: 75 73  Resp:  19  Temp: 98.3 F (36.8 C) 98.6 F (37 C)  SpO2: 99% 98%   Vitals:   07/17/23 1335 07/17/23 2112 07/18/23 0658 07/18/23 0808  BP: 114/70 124/74 (!) 157/82 (!) 144/78  Pulse: 82 74 75 73  Resp: 16   19  Temp: 98.4 F (36.9 C) 98.4 F (36.9 C) 98.3 F (36.8 C) 98.6 F (37 C)  TempSrc: Oral Oral Oral Oral  SpO2: 100% 98% 99% 98%  Weight:      Height:        General: Pt is alert, awake, not in acute distress Cardiovascular: RRR, S1/S2 +, no rubs, no  gallops Respiratory: CTA bilaterally, no wheezing, no rhonchi Abdominal: Soft, NT, ND, bowel sounds + Extremities: no edema, no cyanosis    The results of significant diagnostics from this hospitalization (including imaging, microbiology, ancillary and laboratory) are listed below for reference.     Microbiology: Recent Results (from the past 240 hours)  Surgical pcr screen     Status: Abnormal   Collection Time: 07/13/23  6:39 AM   Specimen: Nasal Mucosa; Nasal Swab  Result Value Ref Range Status   MRSA, PCR NEGATIVE NEGATIVE Final   Staphylococcus aureus POSITIVE (A) NEGATIVE Final    Comment: (NOTE) The Xpert SA Assay (FDA approved for NASAL specimens in patients 51 years of age and older), is one component of a comprehensive surveillance program. It is not intended to diagnose infection nor to guide or monitor treatment. Performed at [facility], [address], [place], [place] [address]      Labs: BNP (last 3 results) No results for input(s): "BNP" in the last 8760 hours. Basic Metabolic Panel: Recent Labs  Lab 07/12/23 2158 07/13/23 0625 07/13/23 1330 07/14/23 0556  NA 135 136 135 133*  K 4.0 4.0 4.4 4.6  CL 109 106 104 106  CO2 19* 23  --  20*  GLUCOSE 214* 144* 153* 213*  BUN 12 9 8 11   CREATININE 0.76 0.78 0.50* 0.89  CALCIUM 9.1 9.2  --  9.0  MG  --  1.7  --   --   PHOS  --  3.0  --   --    Liver Function Tests: No results for input(s): "AST", "ALT", "ALKPHOS", "BILITOT", "PROT", "ALBUMIN" in the last 168 hours. No results for input(s): "LIPASE", "AMYLASE" in the last 168 hours. No results for input(s): "AMMONIA" in the last 168 hours. CBC: Recent Labs  Lab 07/12/23 2158 07/13/23 0625 07/13/23 1330 07/14/23 0556 07/15/23 0524 07/15/23 0813 07/15/23 1435 07/16/23 0952 07/17/23 0545  WBC 9.7 8.4  --  9.2 5.3  --   --   --  6.3  HGB 11.6* 12.0*   < > 8.3* 6.9* 7.6* 6.6* 9.1* 9.6*  HCT 35.0* 35.7*   < > 24.7* 20.8* 22.8* 19.8* 26.6*  28.4*  MCV 86.4 86.4  --  86.7 86.7  --   --   --  85.8  PLT 227 196  --  154 142*  --   --   --  200   < > = values in this interval not displayed.   Cardiac Enzymes: No results for input(s): "CKTOTAL", "CKMB", "CKMBINDEX", "TROPONINI" in the last 168 hours. BNP: Invalid input(s): "POCBNP" CBG: Recent Labs  Lab 07/17/23 1147 07/17/23 1641 07/17/23 2112 07/18/23 0806 07/18/23  1125  GLUCAP 329* 274* 258* 217* 300*   D-Dimer No results for input(s): "DDIMER" in the last 72 hours. Hgb A1c No results for input(s): "HGBA1C" in the last 72 hours. Lipid Profile No results for input(s): "CHOL", "HDL", "LDLCALC", "TRIG", "CHOLHDL", "LDLDIRECT" in the last 72 hours. Thyroid function studies No results for input(s): "TSH", "T4TOTAL", "T3FREE", "THYROIDAB" in the last 72 hours.  Invalid input(s): "FREET3" Anemia work up No results for input(s): "VITAMINB12", "FOLATE", "FERRITIN", "TIBC", "IRON", "RETICCTPCT" in the last 72 hours. Urinalysis    Component Value Date/Time   COLORURINE YELLOW 07/14/2023 0916   APPEARANCEUR CLEAR 07/14/2023 0916   LABSPEC >1.030 (H) 07/14/2023 0916   PHURINE 6.0 07/14/2023 0916   GLUCOSEU 100 (A) 07/14/2023 0916   HGBUR NEGATIVE 07/14/2023 0916   BILIRUBINUR NEGATIVE 07/14/2023 0916   BILIRUBINUR negative 02/19/2020 0957   BILIRUBINUR negative 01/09/2012 0936   KETONESUR NEGATIVE 07/14/2023 0916   PROTEINUR NEGATIVE 07/14/2023 0916   UROBILINOGEN 0.2 02/19/2020 0957   NITRITE NEGATIVE 07/14/2023 0916   LEUKOCYTESUR NEGATIVE 07/14/2023 0916   Sepsis Labs Recent Labs  Lab 07/13/23 0625 07/14/23 0556 07/15/23 0524 07/17/23 0545  WBC 8.4 9.2 5.3 6.3   Microbiology Recent Results (from the past 240 hours)  Surgical pcr screen     Status: Abnormal   Collection Time: 07/13/23  6:39 AM   Specimen: Nasal Mucosa; Nasal Swab  Result Value Ref Range Status   MRSA, PCR NEGATIVE NEGATIVE Final   Staphylococcus aureus POSITIVE (A) NEGATIVE Final     Comment: (NOTE) The Xpert SA Assay (FDA approved for NASAL specimens in patients 38 years of age and older), is one component of a comprehensive surveillance program. It is not intended to diagnose infection nor to guide or monitor treatment. Performed at [facility], [address] [place], [place] [address]      Time coordinating discharge: Over 30 minutes  SIGNED:   Azucena Fallen, DO [facility] 07/18/2023, 11:55 AM Pager   If 7PM-7AM, please contact night-coverage [email or web address]

## 2023-07-19 ENCOUNTER — Other Ambulatory Visit (HOSPITAL_COMMUNITY): Payer: Self-pay

## 2023-07-19 LAB — GLUCOSE, CAPILLARY: Glucose-Capillary: 268 mg/dL — ABNORMAL HIGH (ref 70–99)

## 2023-07-19 NOTE — Plan of Care (Signed)
  Problem: Education: Goal: Ability to describe self-care measures that may prevent or decrease complications (Diabetes Survival Skills Education) will improve Outcome: Progressing Goal: Individualized Educational Video(s) Outcome: Progressing   Problem: Coping: Goal: Ability to adjust to condition or change in health will improve Outcome: Progressing   Problem: Nutritional: Goal: Maintenance of adequate nutrition will improve Outcome: Progressing Goal: Progress toward achieving an optimal weight will improve Outcome: Progressing   Problem: Skin Integrity: Goal: Risk for impaired skin integrity will decrease Outcome: Progressing   Problem: Tissue Perfusion: Goal: Adequacy of tissue perfusion will improve Outcome: Progressing   Problem: Education: Goal: Knowledge of General Education information will improve Description: Including pain rating scale, medication(s)/side effects and non-pharmacologic comfort measures Outcome: Progressing   Problem: Health Behavior/Discharge Planning: Goal: Ability to manage health-related needs will improve Outcome: Progressing   Problem: Clinical Measurements: Goal: Ability to maintain clinical measurements within normal limits will improve Outcome: Progressing Goal: Will remain free from infection Outcome: Progressing Goal: Diagnostic test results will improve Outcome: Progressing Goal: Respiratory complications will improve Outcome: Progressing Goal: Cardiovascular complication will be avoided Outcome: Progressing   Problem: Activity: Goal: Risk for activity intolerance will decrease Outcome: Progressing   Problem: Nutrition: Goal: Adequate nutrition will be maintained Outcome: Progressing   Problem: Coping: Goal: Level of anxiety will decrease Outcome: Progressing   Problem: Elimination: Goal: Will not experience complications related to bowel motility Outcome: Progressing Goal: Will not experience complications related to  urinary retention Outcome: Progressing   Problem: Pain Managment: Goal: General experience of comfort will improve and/or be controlled Outcome: Progressing   Problem: Safety: Goal: Ability to remain free from injury will improve Outcome: Progressing   Problem: Skin Integrity: Goal: Risk for impaired skin integrity will decrease Outcome: Progressing

## 2023-07-19 NOTE — TOC Transition Note (Signed)
 Transition of Care [facility]) - Discharge Note   Patient Details  Name: Andrew Tanner MRN: [id number] Date of Birth: Jun 04, 1950  [facility] CM/SW Contact:  Epifanio Lesches, RN Phone Number: 07/19/2023, 10:10 AM   Clinical Narrative:    Patient will DC to: home, [facility] ( residential care home) Anticipated DC date: 07/19/2023 Family notified: yes Transport by: wife  Per MD patient ready for DC today. RN, patient, patient's wife,and Artavia with [facility] notified of DC.DME delivered by [employer] to residential care home. D/C summary emailed to  [email or web address] after permission received from pt . Post hospital f/u noted on AVS.  Pt without RX meds concerns. Wife to provide transportation to residential care home.    RNCM will sign off for now as intervention is no longer needed. Please consult [place] again if new needs arise.    Final next level of care: Home w Home Health Services Barriers to Discharge: Other (must enter comment) (DME delivery)   Patient Goals and CMS Choice Patient states their goals for this hospitalization and ongoing recovery are:: walk   Choice offered to / list presented to : Patient, Spouse      Discharge Placement                       Discharge Plan and Services Additional resources added to the After Visit Summary for   In-house Referral: Clinical Social Work   Post Acute Care Choice:  (TBD)          DME Arranged: Hospital bed, Lightweight manual wheelchair with seat cushion, Bedside commode   Date DME Agency Contacted: 07/18/23 Time DME Agency Contacted: 1210   HH Arranged: PT, OT, Speech Therapy HH Agency: [facility] Date HH Agency Contacted: 07/18/23 Time HH Agency Contacted: 1210 Representative spoke with at [facility] Agency: ARTAVIA  Social Drivers of Health (SDOH) Interventions SDOH Screenings   Food Insecurity: No Food Insecurity (07/13/2023)  Housing: Low Risk   (07/13/2023)  Transportation Needs: No Transportation Needs (07/13/2023)  Utilities: Not At Risk (07/13/2023)  Alcohol Screen: Low Risk  (10/19/2020)  Depression (PHQ2-9): Low Risk  (03/20/2023)  Financial Resource Strain: Low Risk  (03/16/2023)  Physical Activity: Sufficiently Active (03/16/2023)  Social Connections: Socially Integrated (07/13/2023)  Stress: No Stress Concern Present (03/16/2023)  Tobacco Use: Medium Risk (07/13/2023)  Health Literacy: Adequate Health Literacy (01/03/2023)     Readmission Risk Interventions    05/15/2023    8:24 AM  Readmission Risk Prevention Plan  Transportation Screening Complete  PCP or Specialist Appt within 3-5 Days Complete  HRI or Home Care Consult Complete

## 2023-07-19 NOTE — Progress Notes (Signed)
 Mobility Specialist Progress Note:   07/19/23 1021  Therapy Vitals  Temp 98 F (36.7 C)  Pulse Rate 87  BP (!) 143/82  Patient Position (if appropriate) Lying  Oxygen Therapy  SpO2 100 %  Mobility  Activity Ambulated with assistance in room;Stood at bedside  Level of Assistance Minimal assist, patient does 75% or more  Assistive Device Front wheel walker  Distance Ambulated (ft) 5 ft  LLE Weight Bearing Per Provider Order WBAT  Activity Response Tolerated well  Mobility Referral Yes  Mobility visit 1 Mobility  Mobility Specialist Start Time (ACUTE ONLY) [id number]  Mobility Specialist Stop Time (ACUTE ONLY) [id number]  Mobility Specialist Time Calculation (min) (ACUTE ONLY) 7 min   Pt received in bed, requesting assistance to be positioned higher in bed. Required minA to come EOB and stand. Able to take lateral steps along bedside. No complaints throughout. Pt left in bed with call bell and family present. Bed alarm on.  D'Vante Earlene Plater Mobility Specialist Please contact via Special educational needs teacher or Rehab office at [phone]

## 2023-07-20 ENCOUNTER — Telehealth: Payer: Self-pay

## 2023-07-20 DIAGNOSIS — R41841 Cognitive communication deficit: Secondary | ICD-10-CM | POA: Diagnosis not present

## 2023-07-20 DIAGNOSIS — E1142 Type 2 diabetes mellitus with diabetic polyneuropathy: Secondary | ICD-10-CM | POA: Diagnosis not present

## 2023-07-20 DIAGNOSIS — E785 Hyperlipidemia, unspecified: Secondary | ICD-10-CM | POA: Diagnosis not present

## 2023-07-20 DIAGNOSIS — H269 Unspecified cataract: Secondary | ICD-10-CM | POA: Diagnosis not present

## 2023-07-20 DIAGNOSIS — C7A8 Other malignant neuroendocrine tumors: Secondary | ICD-10-CM | POA: Diagnosis not present

## 2023-07-20 DIAGNOSIS — R1312 Dysphagia, oropharyngeal phase: Secondary | ICD-10-CM | POA: Diagnosis not present

## 2023-07-20 DIAGNOSIS — E1140 Type 2 diabetes mellitus with diabetic neuropathy, unspecified: Secondary | ICD-10-CM | POA: Diagnosis not present

## 2023-07-20 DIAGNOSIS — Z604 Social exclusion and rejection: Secondary | ICD-10-CM | POA: Diagnosis not present

## 2023-07-20 DIAGNOSIS — G709 Myoneural disorder, unspecified: Secondary | ICD-10-CM | POA: Diagnosis not present

## 2023-07-20 DIAGNOSIS — I10 Essential (primary) hypertension: Secondary | ICD-10-CM | POA: Diagnosis not present

## 2023-07-20 DIAGNOSIS — C179 Malignant neoplasm of small intestine, unspecified: Secondary | ICD-10-CM | POA: Diagnosis not present

## 2023-07-20 DIAGNOSIS — E1165 Type 2 diabetes mellitus with hyperglycemia: Secondary | ICD-10-CM | POA: Diagnosis not present

## 2023-07-20 DIAGNOSIS — S72002A Fracture of unspecified part of neck of left femur, initial encounter for closed fracture: Secondary | ICD-10-CM | POA: Diagnosis not present

## 2023-07-20 DIAGNOSIS — Z794 Long term (current) use of insulin: Secondary | ICD-10-CM | POA: Diagnosis not present

## 2023-07-20 NOTE — Telephone Encounter (Signed)
 Copied from CRM [id number]. Topic: Clinical - Home Health Verbal Orders >> Jul 20, 2023  2:37 PM Myrtice Lauth wrote: Caller/Agency: [facility]  Callback Number [phone] Service Requested: Speech Therapy Frequency: 1 week 6 Any new concerns about the patient? No  Please review and advise on verbal orders

## 2023-07-21 DIAGNOSIS — R41841 Cognitive communication deficit: Secondary | ICD-10-CM | POA: Diagnosis not present

## 2023-07-21 DIAGNOSIS — E1142 Type 2 diabetes mellitus with diabetic polyneuropathy: Secondary | ICD-10-CM | POA: Diagnosis not present

## 2023-07-21 DIAGNOSIS — R1312 Dysphagia, oropharyngeal phase: Secondary | ICD-10-CM | POA: Diagnosis not present

## 2023-07-21 DIAGNOSIS — H269 Unspecified cataract: Secondary | ICD-10-CM | POA: Diagnosis not present

## 2023-07-21 DIAGNOSIS — C179 Malignant neoplasm of small intestine, unspecified: Secondary | ICD-10-CM | POA: Diagnosis not present

## 2023-07-21 DIAGNOSIS — E1165 Type 2 diabetes mellitus with hyperglycemia: Secondary | ICD-10-CM | POA: Diagnosis not present

## 2023-07-21 DIAGNOSIS — I10 Essential (primary) hypertension: Secondary | ICD-10-CM | POA: Diagnosis not present

## 2023-07-21 DIAGNOSIS — E785 Hyperlipidemia, unspecified: Secondary | ICD-10-CM | POA: Diagnosis not present

## 2023-07-21 DIAGNOSIS — Z794 Long term (current) use of insulin: Secondary | ICD-10-CM | POA: Diagnosis not present

## 2023-07-21 DIAGNOSIS — C7A8 Other malignant neuroendocrine tumors: Secondary | ICD-10-CM | POA: Diagnosis not present

## 2023-07-21 DIAGNOSIS — Z604 Social exclusion and rejection: Secondary | ICD-10-CM | POA: Diagnosis not present

## 2023-07-21 NOTE — Telephone Encounter (Signed)
 Called pt and lvm advising needing hospital follow up and could be VV; asked pt to return call to schedule OV/VV and one opening for today with PCP

## 2023-07-25 ENCOUNTER — Telehealth: Payer: Self-pay | Admitting: Family Medicine

## 2023-07-25 DIAGNOSIS — C179 Malignant neoplasm of small intestine, unspecified: Secondary | ICD-10-CM | POA: Diagnosis not present

## 2023-07-25 DIAGNOSIS — E1142 Type 2 diabetes mellitus with diabetic polyneuropathy: Secondary | ICD-10-CM | POA: Diagnosis not present

## 2023-07-25 DIAGNOSIS — I10 Essential (primary) hypertension: Secondary | ICD-10-CM | POA: Diagnosis not present

## 2023-07-25 DIAGNOSIS — E1165 Type 2 diabetes mellitus with hyperglycemia: Secondary | ICD-10-CM | POA: Diagnosis not present

## 2023-07-25 DIAGNOSIS — C7A8 Other malignant neuroendocrine tumors: Secondary | ICD-10-CM | POA: Diagnosis not present

## 2023-07-25 DIAGNOSIS — R1312 Dysphagia, oropharyngeal phase: Secondary | ICD-10-CM | POA: Diagnosis not present

## 2023-07-25 DIAGNOSIS — E785 Hyperlipidemia, unspecified: Secondary | ICD-10-CM | POA: Diagnosis not present

## 2023-07-25 DIAGNOSIS — R41841 Cognitive communication deficit: Secondary | ICD-10-CM | POA: Diagnosis not present

## 2023-07-25 DIAGNOSIS — Z794 Long term (current) use of insulin: Secondary | ICD-10-CM | POA: Diagnosis not present

## 2023-07-25 DIAGNOSIS — Z604 Social exclusion and rejection: Secondary | ICD-10-CM | POA: Diagnosis not present

## 2023-07-25 DIAGNOSIS — H269 Unspecified cataract: Secondary | ICD-10-CM | POA: Diagnosis not present

## 2023-07-25 NOTE — Telephone Encounter (Signed)
 [facility] faxed document Home Health Certificate (Order [place] [id number] ), to be filled out by provider. Patient requested to send it back via Fax . Document is located in providers tray at front office.Please advise .

## 2023-07-26 NOTE — Telephone Encounter (Signed)
 Form has been faxed.

## 2023-07-28 DIAGNOSIS — H269 Unspecified cataract: Secondary | ICD-10-CM | POA: Diagnosis not present

## 2023-07-28 DIAGNOSIS — R41841 Cognitive communication deficit: Secondary | ICD-10-CM | POA: Diagnosis not present

## 2023-07-28 DIAGNOSIS — R1312 Dysphagia, oropharyngeal phase: Secondary | ICD-10-CM | POA: Diagnosis not present

## 2023-07-28 DIAGNOSIS — Z794 Long term (current) use of insulin: Secondary | ICD-10-CM | POA: Diagnosis not present

## 2023-07-28 DIAGNOSIS — C7A8 Other malignant neuroendocrine tumors: Secondary | ICD-10-CM | POA: Diagnosis not present

## 2023-07-28 DIAGNOSIS — I10 Essential (primary) hypertension: Secondary | ICD-10-CM | POA: Diagnosis not present

## 2023-07-28 DIAGNOSIS — E785 Hyperlipidemia, unspecified: Secondary | ICD-10-CM | POA: Diagnosis not present

## 2023-07-28 DIAGNOSIS — E1142 Type 2 diabetes mellitus with diabetic polyneuropathy: Secondary | ICD-10-CM | POA: Diagnosis not present

## 2023-07-28 DIAGNOSIS — C179 Malignant neoplasm of small intestine, unspecified: Secondary | ICD-10-CM | POA: Diagnosis not present

## 2023-07-28 DIAGNOSIS — E1165 Type 2 diabetes mellitus with hyperglycemia: Secondary | ICD-10-CM | POA: Diagnosis not present

## 2023-07-28 DIAGNOSIS — Z604 Social exclusion and rejection: Secondary | ICD-10-CM | POA: Diagnosis not present

## 2023-07-29 DIAGNOSIS — I10 Essential (primary) hypertension: Secondary | ICD-10-CM | POA: Diagnosis not present

## 2023-07-29 DIAGNOSIS — E872 Acidosis, unspecified: Secondary | ICD-10-CM | POA: Diagnosis not present

## 2023-07-29 DIAGNOSIS — E440 Moderate protein-calorie malnutrition: Secondary | ICD-10-CM | POA: Diagnosis not present

## 2023-07-29 DIAGNOSIS — Z87891 Personal history of nicotine dependence: Secondary | ICD-10-CM | POA: Diagnosis not present

## 2023-07-29 DIAGNOSIS — Z794 Long term (current) use of insulin: Secondary | ICD-10-CM | POA: Diagnosis not present

## 2023-07-29 DIAGNOSIS — N319 Neuromuscular dysfunction of bladder, unspecified: Secondary | ICD-10-CM | POA: Diagnosis not present

## 2023-07-29 DIAGNOSIS — Z9181 History of falling: Secondary | ICD-10-CM | POA: Diagnosis not present

## 2023-07-29 DIAGNOSIS — E1142 Type 2 diabetes mellitus with diabetic polyneuropathy: Secondary | ICD-10-CM | POA: Diagnosis not present

## 2023-07-29 DIAGNOSIS — Z85828 Personal history of other malignant neoplasm of skin: Secondary | ICD-10-CM | POA: Diagnosis not present

## 2023-07-29 DIAGNOSIS — C179 Malignant neoplasm of small intestine, unspecified: Secondary | ICD-10-CM | POA: Diagnosis not present

## 2023-07-29 DIAGNOSIS — F32A Depression, unspecified: Secondary | ICD-10-CM | POA: Diagnosis not present

## 2023-07-29 DIAGNOSIS — C7A8 Other malignant neuroendocrine tumors: Secondary | ICD-10-CM | POA: Diagnosis not present

## 2023-07-29 DIAGNOSIS — Z96642 Presence of left artificial hip joint: Secondary | ICD-10-CM | POA: Diagnosis not present

## 2023-07-29 DIAGNOSIS — E1136 Type 2 diabetes mellitus with diabetic cataract: Secondary | ICD-10-CM | POA: Diagnosis not present

## 2023-07-29 DIAGNOSIS — D500 Iron deficiency anemia secondary to blood loss (chronic): Secondary | ICD-10-CM | POA: Diagnosis not present

## 2023-07-29 DIAGNOSIS — E785 Hyperlipidemia, unspecified: Secondary | ICD-10-CM | POA: Diagnosis not present

## 2023-07-29 DIAGNOSIS — F419 Anxiety disorder, unspecified: Secondary | ICD-10-CM | POA: Diagnosis not present

## 2023-07-29 DIAGNOSIS — E1165 Type 2 diabetes mellitus with hyperglycemia: Secondary | ICD-10-CM | POA: Diagnosis not present

## 2023-07-29 DIAGNOSIS — R1312 Dysphagia, oropharyngeal phase: Secondary | ICD-10-CM | POA: Diagnosis not present

## 2023-07-31 DIAGNOSIS — E1136 Type 2 diabetes mellitus with diabetic cataract: Secondary | ICD-10-CM | POA: Diagnosis not present

## 2023-07-31 DIAGNOSIS — E785 Hyperlipidemia, unspecified: Secondary | ICD-10-CM | POA: Diagnosis not present

## 2023-07-31 DIAGNOSIS — F419 Anxiety disorder, unspecified: Secondary | ICD-10-CM | POA: Diagnosis not present

## 2023-07-31 DIAGNOSIS — I10 Essential (primary) hypertension: Secondary | ICD-10-CM | POA: Diagnosis not present

## 2023-07-31 DIAGNOSIS — C7A8 Other malignant neuroendocrine tumors: Secondary | ICD-10-CM | POA: Diagnosis not present

## 2023-07-31 DIAGNOSIS — R1312 Dysphagia, oropharyngeal phase: Secondary | ICD-10-CM | POA: Diagnosis not present

## 2023-07-31 DIAGNOSIS — Z794 Long term (current) use of insulin: Secondary | ICD-10-CM | POA: Diagnosis not present

## 2023-07-31 DIAGNOSIS — D500 Iron deficiency anemia secondary to blood loss (chronic): Secondary | ICD-10-CM | POA: Diagnosis not present

## 2023-07-31 DIAGNOSIS — E1142 Type 2 diabetes mellitus with diabetic polyneuropathy: Secondary | ICD-10-CM | POA: Diagnosis not present

## 2023-07-31 DIAGNOSIS — N319 Neuromuscular dysfunction of bladder, unspecified: Secondary | ICD-10-CM | POA: Diagnosis not present

## 2023-07-31 DIAGNOSIS — S72002D Fracture of unspecified part of neck of left femur, subsequent encounter for closed fracture with routine healing: Secondary | ICD-10-CM | POA: Diagnosis not present

## 2023-07-31 DIAGNOSIS — E440 Moderate protein-calorie malnutrition: Secondary | ICD-10-CM | POA: Diagnosis not present

## 2023-07-31 DIAGNOSIS — C179 Malignant neoplasm of small intestine, unspecified: Secondary | ICD-10-CM | POA: Diagnosis not present

## 2023-07-31 DIAGNOSIS — E872 Acidosis, unspecified: Secondary | ICD-10-CM | POA: Diagnosis not present

## 2023-07-31 DIAGNOSIS — E1165 Type 2 diabetes mellitus with hyperglycemia: Secondary | ICD-10-CM | POA: Diagnosis not present

## 2023-07-31 DIAGNOSIS — F32A Depression, unspecified: Secondary | ICD-10-CM | POA: Diagnosis not present

## 2023-08-01 DIAGNOSIS — N319 Neuromuscular dysfunction of bladder, unspecified: Secondary | ICD-10-CM | POA: Diagnosis not present

## 2023-08-01 DIAGNOSIS — C179 Malignant neoplasm of small intestine, unspecified: Secondary | ICD-10-CM | POA: Diagnosis not present

## 2023-08-01 DIAGNOSIS — R1312 Dysphagia, oropharyngeal phase: Secondary | ICD-10-CM | POA: Diagnosis not present

## 2023-08-01 DIAGNOSIS — I10 Essential (primary) hypertension: Secondary | ICD-10-CM | POA: Diagnosis not present

## 2023-08-01 DIAGNOSIS — D500 Iron deficiency anemia secondary to blood loss (chronic): Secondary | ICD-10-CM | POA: Diagnosis not present

## 2023-08-01 DIAGNOSIS — F32A Depression, unspecified: Secondary | ICD-10-CM | POA: Diagnosis not present

## 2023-08-01 DIAGNOSIS — F419 Anxiety disorder, unspecified: Secondary | ICD-10-CM | POA: Diagnosis not present

## 2023-08-01 DIAGNOSIS — E440 Moderate protein-calorie malnutrition: Secondary | ICD-10-CM | POA: Diagnosis not present

## 2023-08-01 DIAGNOSIS — E872 Acidosis, unspecified: Secondary | ICD-10-CM | POA: Diagnosis not present

## 2023-08-01 DIAGNOSIS — S72002D Fracture of unspecified part of neck of left femur, subsequent encounter for closed fracture with routine healing: Secondary | ICD-10-CM | POA: Diagnosis not present

## 2023-08-01 DIAGNOSIS — E785 Hyperlipidemia, unspecified: Secondary | ICD-10-CM | POA: Diagnosis not present

## 2023-08-01 DIAGNOSIS — Z794 Long term (current) use of insulin: Secondary | ICD-10-CM | POA: Diagnosis not present

## 2023-08-01 DIAGNOSIS — E1136 Type 2 diabetes mellitus with diabetic cataract: Secondary | ICD-10-CM | POA: Diagnosis not present

## 2023-08-01 DIAGNOSIS — C7A8 Other malignant neuroendocrine tumors: Secondary | ICD-10-CM | POA: Diagnosis not present

## 2023-08-01 DIAGNOSIS — E1165 Type 2 diabetes mellitus with hyperglycemia: Secondary | ICD-10-CM | POA: Diagnosis not present

## 2023-08-01 DIAGNOSIS — E1142 Type 2 diabetes mellitus with diabetic polyneuropathy: Secondary | ICD-10-CM | POA: Diagnosis not present

## 2023-08-02 DIAGNOSIS — E785 Hyperlipidemia, unspecified: Secondary | ICD-10-CM | POA: Diagnosis not present

## 2023-08-02 DIAGNOSIS — E440 Moderate protein-calorie malnutrition: Secondary | ICD-10-CM | POA: Diagnosis not present

## 2023-08-02 DIAGNOSIS — E1142 Type 2 diabetes mellitus with diabetic polyneuropathy: Secondary | ICD-10-CM | POA: Diagnosis not present

## 2023-08-02 DIAGNOSIS — R1312 Dysphagia, oropharyngeal phase: Secondary | ICD-10-CM | POA: Diagnosis not present

## 2023-08-02 DIAGNOSIS — E1136 Type 2 diabetes mellitus with diabetic cataract: Secondary | ICD-10-CM | POA: Diagnosis not present

## 2023-08-02 DIAGNOSIS — Z794 Long term (current) use of insulin: Secondary | ICD-10-CM | POA: Diagnosis not present

## 2023-08-02 DIAGNOSIS — I10 Essential (primary) hypertension: Secondary | ICD-10-CM | POA: Diagnosis not present

## 2023-08-02 DIAGNOSIS — F419 Anxiety disorder, unspecified: Secondary | ICD-10-CM | POA: Diagnosis not present

## 2023-08-02 DIAGNOSIS — E1165 Type 2 diabetes mellitus with hyperglycemia: Secondary | ICD-10-CM | POA: Diagnosis not present

## 2023-08-02 DIAGNOSIS — S72002D Fracture of unspecified part of neck of left femur, subsequent encounter for closed fracture with routine healing: Secondary | ICD-10-CM | POA: Diagnosis not present

## 2023-08-02 DIAGNOSIS — C179 Malignant neoplasm of small intestine, unspecified: Secondary | ICD-10-CM | POA: Diagnosis not present

## 2023-08-02 DIAGNOSIS — D500 Iron deficiency anemia secondary to blood loss (chronic): Secondary | ICD-10-CM | POA: Diagnosis not present

## 2023-08-02 DIAGNOSIS — F32A Depression, unspecified: Secondary | ICD-10-CM | POA: Diagnosis not present

## 2023-08-02 DIAGNOSIS — E872 Acidosis, unspecified: Secondary | ICD-10-CM | POA: Diagnosis not present

## 2023-08-02 DIAGNOSIS — N319 Neuromuscular dysfunction of bladder, unspecified: Secondary | ICD-10-CM | POA: Diagnosis not present

## 2023-08-02 DIAGNOSIS — C7A8 Other malignant neuroendocrine tumors: Secondary | ICD-10-CM | POA: Diagnosis not present

## 2023-08-04 DIAGNOSIS — C179 Malignant neoplasm of small intestine, unspecified: Secondary | ICD-10-CM | POA: Diagnosis not present

## 2023-08-04 DIAGNOSIS — C7A8 Other malignant neuroendocrine tumors: Secondary | ICD-10-CM | POA: Diagnosis not present

## 2023-08-04 DIAGNOSIS — F32A Depression, unspecified: Secondary | ICD-10-CM | POA: Diagnosis not present

## 2023-08-04 DIAGNOSIS — N319 Neuromuscular dysfunction of bladder, unspecified: Secondary | ICD-10-CM | POA: Diagnosis not present

## 2023-08-04 DIAGNOSIS — E872 Acidosis, unspecified: Secondary | ICD-10-CM | POA: Diagnosis not present

## 2023-08-04 DIAGNOSIS — R1312 Dysphagia, oropharyngeal phase: Secondary | ICD-10-CM | POA: Diagnosis not present

## 2023-08-04 DIAGNOSIS — D500 Iron deficiency anemia secondary to blood loss (chronic): Secondary | ICD-10-CM | POA: Diagnosis not present

## 2023-08-04 DIAGNOSIS — E1165 Type 2 diabetes mellitus with hyperglycemia: Secondary | ICD-10-CM | POA: Diagnosis not present

## 2023-08-04 DIAGNOSIS — E440 Moderate protein-calorie malnutrition: Secondary | ICD-10-CM | POA: Diagnosis not present

## 2023-08-04 DIAGNOSIS — S72002D Fracture of unspecified part of neck of left femur, subsequent encounter for closed fracture with routine healing: Secondary | ICD-10-CM | POA: Diagnosis not present

## 2023-08-04 DIAGNOSIS — Z794 Long term (current) use of insulin: Secondary | ICD-10-CM | POA: Diagnosis not present

## 2023-08-04 DIAGNOSIS — E1136 Type 2 diabetes mellitus with diabetic cataract: Secondary | ICD-10-CM | POA: Diagnosis not present

## 2023-08-04 DIAGNOSIS — F419 Anxiety disorder, unspecified: Secondary | ICD-10-CM | POA: Diagnosis not present

## 2023-08-04 DIAGNOSIS — I10 Essential (primary) hypertension: Secondary | ICD-10-CM | POA: Diagnosis not present

## 2023-08-04 DIAGNOSIS — E1142 Type 2 diabetes mellitus with diabetic polyneuropathy: Secondary | ICD-10-CM | POA: Diagnosis not present

## 2023-08-04 DIAGNOSIS — E785 Hyperlipidemia, unspecified: Secondary | ICD-10-CM | POA: Diagnosis not present

## 2023-08-07 DIAGNOSIS — C179 Malignant neoplasm of small intestine, unspecified: Secondary | ICD-10-CM | POA: Diagnosis not present

## 2023-08-07 DIAGNOSIS — E1165 Type 2 diabetes mellitus with hyperglycemia: Secondary | ICD-10-CM | POA: Diagnosis not present

## 2023-08-07 DIAGNOSIS — Z794 Long term (current) use of insulin: Secondary | ICD-10-CM | POA: Diagnosis not present

## 2023-08-07 DIAGNOSIS — F32A Depression, unspecified: Secondary | ICD-10-CM | POA: Diagnosis not present

## 2023-08-07 DIAGNOSIS — E1142 Type 2 diabetes mellitus with diabetic polyneuropathy: Secondary | ICD-10-CM | POA: Diagnosis not present

## 2023-08-07 DIAGNOSIS — D132 Benign neoplasm of duodenum: Secondary | ICD-10-CM | POA: Diagnosis not present

## 2023-08-07 DIAGNOSIS — N319 Neuromuscular dysfunction of bladder, unspecified: Secondary | ICD-10-CM | POA: Diagnosis not present

## 2023-08-07 DIAGNOSIS — S72002D Fracture of unspecified part of neck of left femur, subsequent encounter for closed fracture with routine healing: Secondary | ICD-10-CM | POA: Diagnosis not present

## 2023-08-07 DIAGNOSIS — E1136 Type 2 diabetes mellitus with diabetic cataract: Secondary | ICD-10-CM | POA: Diagnosis not present

## 2023-08-07 DIAGNOSIS — E440 Moderate protein-calorie malnutrition: Secondary | ICD-10-CM | POA: Diagnosis not present

## 2023-08-07 DIAGNOSIS — C7A8 Other malignant neuroendocrine tumors: Secondary | ICD-10-CM | POA: Diagnosis not present

## 2023-08-07 DIAGNOSIS — R1312 Dysphagia, oropharyngeal phase: Secondary | ICD-10-CM | POA: Diagnosis not present

## 2023-08-07 DIAGNOSIS — I10 Essential (primary) hypertension: Secondary | ICD-10-CM | POA: Diagnosis not present

## 2023-08-07 DIAGNOSIS — D500 Iron deficiency anemia secondary to blood loss (chronic): Secondary | ICD-10-CM | POA: Diagnosis not present

## 2023-08-07 DIAGNOSIS — E872 Acidosis, unspecified: Secondary | ICD-10-CM | POA: Diagnosis not present

## 2023-08-07 DIAGNOSIS — E43 Unspecified severe protein-calorie malnutrition: Secondary | ICD-10-CM | POA: Diagnosis not present

## 2023-08-07 DIAGNOSIS — E785 Hyperlipidemia, unspecified: Secondary | ICD-10-CM | POA: Diagnosis not present

## 2023-08-07 DIAGNOSIS — F419 Anxiety disorder, unspecified: Secondary | ICD-10-CM | POA: Diagnosis not present

## 2023-08-07 DIAGNOSIS — R29898 Other symptoms and signs involving the musculoskeletal system: Secondary | ICD-10-CM | POA: Diagnosis not present

## 2023-08-10 DIAGNOSIS — Z85828 Personal history of other malignant neoplasm of skin: Secondary | ICD-10-CM | POA: Diagnosis not present

## 2023-08-10 DIAGNOSIS — Z96642 Presence of left artificial hip joint: Secondary | ICD-10-CM | POA: Diagnosis not present

## 2023-08-10 DIAGNOSIS — E785 Hyperlipidemia, unspecified: Secondary | ICD-10-CM | POA: Diagnosis not present

## 2023-08-10 DIAGNOSIS — Z9181 History of falling: Secondary | ICD-10-CM | POA: Diagnosis not present

## 2023-08-10 DIAGNOSIS — C179 Malignant neoplasm of small intestine, unspecified: Secondary | ICD-10-CM | POA: Diagnosis not present

## 2023-08-10 DIAGNOSIS — C7A8 Other malignant neuroendocrine tumors: Secondary | ICD-10-CM | POA: Diagnosis not present

## 2023-08-10 DIAGNOSIS — E1136 Type 2 diabetes mellitus with diabetic cataract: Secondary | ICD-10-CM | POA: Diagnosis not present

## 2023-08-10 DIAGNOSIS — D500 Iron deficiency anemia secondary to blood loss (chronic): Secondary | ICD-10-CM | POA: Diagnosis not present

## 2023-08-10 DIAGNOSIS — Z794 Long term (current) use of insulin: Secondary | ICD-10-CM | POA: Diagnosis not present

## 2023-08-10 DIAGNOSIS — I10 Essential (primary) hypertension: Secondary | ICD-10-CM | POA: Diagnosis not present

## 2023-08-10 DIAGNOSIS — R1312 Dysphagia, oropharyngeal phase: Secondary | ICD-10-CM | POA: Diagnosis not present

## 2023-08-10 DIAGNOSIS — E1142 Type 2 diabetes mellitus with diabetic polyneuropathy: Secondary | ICD-10-CM | POA: Diagnosis not present

## 2023-08-10 DIAGNOSIS — E872 Acidosis, unspecified: Secondary | ICD-10-CM | POA: Diagnosis not present

## 2023-08-10 DIAGNOSIS — E440 Moderate protein-calorie malnutrition: Secondary | ICD-10-CM | POA: Diagnosis not present

## 2023-08-10 DIAGNOSIS — S72002D Fracture of unspecified part of neck of left femur, subsequent encounter for closed fracture with routine healing: Secondary | ICD-10-CM | POA: Diagnosis not present

## 2023-08-10 DIAGNOSIS — Z87891 Personal history of nicotine dependence: Secondary | ICD-10-CM | POA: Diagnosis not present

## 2023-08-10 DIAGNOSIS — F419 Anxiety disorder, unspecified: Secondary | ICD-10-CM | POA: Diagnosis not present

## 2023-08-10 DIAGNOSIS — F32A Depression, unspecified: Secondary | ICD-10-CM | POA: Diagnosis not present

## 2023-08-10 DIAGNOSIS — E1165 Type 2 diabetes mellitus with hyperglycemia: Secondary | ICD-10-CM | POA: Diagnosis not present

## 2023-08-10 DIAGNOSIS — N319 Neuromuscular dysfunction of bladder, unspecified: Secondary | ICD-10-CM | POA: Diagnosis not present

## 2023-08-11 ENCOUNTER — Ambulatory Visit (INDEPENDENT_AMBULATORY_CARE_PROVIDER_SITE_OTHER): Admitting: Internal Medicine

## 2023-08-11 ENCOUNTER — Encounter: Payer: Self-pay | Admitting: Internal Medicine

## 2023-08-11 VITALS — BP 120/70 | HR 88 | Ht 71.0 in | Wt 145.0 lb

## 2023-08-11 DIAGNOSIS — Z794 Long term (current) use of insulin: Secondary | ICD-10-CM

## 2023-08-11 DIAGNOSIS — E1159 Type 2 diabetes mellitus with other circulatory complications: Secondary | ICD-10-CM | POA: Diagnosis not present

## 2023-08-11 DIAGNOSIS — Z7984 Long term (current) use of oral hypoglycemic drugs: Secondary | ICD-10-CM | POA: Diagnosis not present

## 2023-08-11 DIAGNOSIS — E1165 Type 2 diabetes mellitus with hyperglycemia: Secondary | ICD-10-CM | POA: Diagnosis not present

## 2023-08-11 DIAGNOSIS — E7849 Other hyperlipidemia: Secondary | ICD-10-CM | POA: Diagnosis not present

## 2023-08-11 LAB — POCT GLYCOSYLATED HEMOGLOBIN (HGB A1C): Hemoglobin A1C: 7.3 % — AB (ref 4.0–5.6)

## 2023-08-11 MED ORDER — LYUMJEV KWIKPEN 100 UNIT/ML ~~LOC~~ SOPN
7.0000 [IU] | PEN_INJECTOR | Freq: Three times a day (TID) | SUBCUTANEOUS | 3 refills | Status: AC
  Filled 2023-12-21: qty 18, 86d supply, fill #0

## 2023-08-11 MED ORDER — LANTUS SOLOSTAR 100 UNIT/ML ~~LOC~~ SOPN: 14.0000 [IU] | PEN_INJECTOR | Freq: Every day | SUBCUTANEOUS | 3 refills | Status: AC

## 2023-08-11 MED ORDER — EMPAGLIFLOZIN 10 MG PO TABS: 10.0000 mg | ORAL_TABLET | Freq: Every day | ORAL | 3 refills | Status: AC

## 2023-08-11 NOTE — Progress Notes (Signed)
 Patient ID: Andrew Tanner, male   DOB: Oct 11, 1950, 73 y.o.   MRN: [id number]  HPI: Andrew Tanner is a 73 y.o.-year-old male, initially referred by his PCP, Dr. Waldo Guitar, returning for follow-up for DM2, dx in ~2010, insulin -dependent, uncontrolled, with complications (aortic atherosclerosis, peripheral neuropathy).  Last visit 4 months ago.  He is here with his wife who offers much of the history including medical events since last visit, diet, activity. He is in [facility] in [place].  Interim history: No increased urination, blurry vision, nausea, chest pain.  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  He had a complicated course afterwards, including a pancreatic leak.  He had many falls and had a left femoral fracture 07/12/2023, for which he had to have left THR.  He currently resides in [facility] care in [place].  He is having physical therapy there, but only approximately 1-2 times a week.  He has severe deconditioning and cognitive difficulties. He lost approximately 30 pounds since last visit.  He is in a wheelchair today.  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  Reviewed HbA1c: Lab Results  Component Value Date   HGBA1C 6.8 (H) 04/21/2023   HGBA1C 7.0 (H) 02/09/2023   HGBA1C 7.8 (H) 07/25/2022   HGBA1C 8.3 (H) 04/25/2022   HGBA1C 7.4 (H) 10/04/2021   HGBA1C 7.1 (H) 07/15/2021   HGBA1C 7.0 (H) 04/06/2021   HGBA1C 6.8 (A) 12/21/2020   HGBA1C 6.7 (H) 08/17/2020   HGBA1C 6.7 (H) 05/18/2020  12/02/2022: HbA1c 8.1%  Pt is on a regimen of: - Metformin  1000 mg 2x a day, with meals >> stopped 2 mo ago in the hospital - Jardiance  25 mg before breakfast >> stopped 2 mo ago in the hospital - Lantus  10 units at bedtime-added 01/18/2023 >>  12 units at night - Lyumjev  3 to 6 units before dinner-started 01/2023 >> 7 units twice a day-with lunch and dinner >> 6 units: Only with lunch He was previously on glipizide  10 mg before breakfast-stopped 01/2023.  Pt checks his sugars >4 times a day with his CGM:    Previously:   Lowest sugar was 90 >> 57 >> >100; ?hypoglycemia awareness. Highest sugar was 373 >> 200 >> 300s  Glucometer: FreeStyle  Pt's meals are: - Breakfast: Rice checks cereals, Greek yogurt, blueberries, other fruit,  >> English muffin + PB - Lunch: Sandwich with meat on whole-grain bread - Dinner: Meat, beans, sweet potatoes - Snacks: 2, but trying to cut down.  These are usually high carb snacks. He is exercising every day along with walking. Stretching and strenghthening + walking 5 days a week. He lost wt (from 230 lbs) and lost 40 lbs since then.  - no CKD, last BUN/creatinine:  Lab Results  Component Value Date   BUN 11 07/14/2023   BUN 8 07/13/2023   CREATININE 0.89 07/14/2023   CREATININE 0.50 (L) 07/13/2023   Lab Results  Component Value Date   MICRALBCREAT 3 03/14/2023   MICRALBCREAT 0.8 04/25/2022   MICRALBCREAT 0.7 04/06/2021   MICRALBCREAT 27 12/31/2018   MICRALBCREAT 4.1 01/01/2018  He is not on ACE inhibitor/ARB.  -+ HL; last set of lipids: Lab Results  Component Value Date   CHOL 126 07/25/2022   HDL 38.40 (L) 07/25/2022   LDLCALC 66 07/25/2022   TRIG 112.0 07/25/2022  CHOLHDL 3 07/25/2022  He is on Crestor  20 mg daily.  - last eye exam was on 08/16/2022. No DR. She has a history of bilateral cataract surgery.  - + neuropathy; + numbness and tingling in his R > L foot Also, cold feet. He has leg weakness and balance pbs. He was approved for the implanted device -planning to get this in the near future.  Last foot exam 03/14/2023.  Pt has FH of DM in father (DM1), brother.  He also has a history of HTN, interstitial cystitis, history of cervical spine surgery - 2005?,  depression/anxiety. He has an atrial myxoma.  He is on thiamine.  ROS: + See HPI + nocturia, + tremors  Past Medical History:  Diagnosis Date   Anxiety    Atrial myxoma    Basal cell carcinoma    Cancer (HCC)    basal cell skin CA   Cataract    Depression    Diabetes mellitus without complication (HCC)    Diabetic neuropathy (HCC)    Duodenal adenoma    Hyperlipidemia    Hypertension    IC (interstitial cystitis)    Neuromuscular disorder (HCC) 01/09/21   Peripheral  Neuropathy   Pancreatic lesion    Squamous cell carcinoma of skin    Past Surgical History:  Procedure Laterality Date   BIOPSY  12/05/2022   Procedure: BIOPSY;  Surgeon: Normie Becton., MD;  Location: [facility];  Service: Gastroenterology;;   CATARACT EXTRACTION, BILATERAL     ESOPHAGOGASTRODUODENOSCOPY N/A 12/05/2022   Procedure: ESOPHAGOGASTRODUODENOSCOPY (EGD);  Surgeon: Normie Becton., MD;  Location: [facility];  Service: Gastroenterology;  Laterality: N/A;   EUS N/A 12/05/2022   Procedure: UPPER ENDOSCOPIC ULTRASOUND (EUS) RADIAL;  Surgeon: Normie Becton., MD;  Location: [facility];  Service: Gastroenterology;  Laterality: N/A;   FINE NEEDLE ASPIRATION N/A 12/05/2022   Procedure: FINE NEEDLE ASPIRATION (FNA) LINEAR;  Surgeon: Normie Becton., MD;  Location: [facility];  Service: Gastroenterology;  Laterality: N/A;   HERNIA REPAIR N/A    inguinal   NECK SURGERY     ant/post   SPINE SURGERY     SUBMUCOSAL TATTOO INJECTION  12/05/2022   Procedure: SUBMUCOSAL TATTOO INJECTION;  Surgeon: Normie Becton., MD;  Location: [facility];  Service: Gastroenterology;;   TOTAL HIP ARTHROPLASTY Left 07/13/2023   Procedure: ARTHROPLASTY, HIP, TOTAL, ANTERIOR APPROACH;  Surgeon: Laneta Pintos, MD;  Location: [facility];  Service: Orthopedics;  Laterality: Left;  [employer] bipolar Hana table  hemi hip   TRANSURETHRAL RESECTION OF PROSTATE     TURP VAPORIZATION      WHIPPLE PROCEDURE N/A 04/27/2023   Procedure: WHIPPLE PROCEDURE;  Surgeon: Lockie Rima, MD;  Location: [facility];  Service: General;  Laterality: N/A;   Social History   Socioeconomic History   Marital status: Married    Spouse name: Cherylann Corpus   Number of children: 1   Years of education: Not on file   Highest education level: Bachelor's degree (e.g., BA, AB, BS)  Occupational History   Not on file  Tobacco Use   Smoking status: Former    Current packs/day: 0.00    Average packs/day: 1 pack/day for 5.0 years (5.0 ttl pk-yrs)    Types: Cigarettes    Start date: 03/02/1977    Quit date: 03/02/1982    Years since quitting: 41.4   Smokeless tobacco: Never   Tobacco comments:    quit 28 yrs ago  Vaping Use   Vaping  status: Never Used  Substance and Sexual Activity   Alcohol use: Not Currently    Comment: none   Drug use: No   Sexual activity: Not Currently    Birth control/protection: Abstinence  Other Topics Concern   Not on file  Social History Narrative   Retired Risk analyst, 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   Social Drivers of Corporate investment banker Strain: Low Risk  (03/16/2023)   Overall Financial Resource Strain (CARDIA)    Difficulty of Paying Living Expenses: Not very hard  Food Insecurity: No Food Insecurity (07/13/2023)   Hunger Vital Sign    Worried About Running Out of Food in the Last Year: Never true    Ran Out of Food in the Last Year: Never true  Transportation Needs: No Transportation Needs (07/13/2023)   PRAPARE - Administrator, Civil Service (Medical): No    Lack of Transportation (Non-Medical): No  Physical Activity: Sufficiently Active (03/16/2023)   Exercise Vital Sign    Days of Exercise per Week: 5 days    Minutes of Exercise per Session: 40 min  Stress: No Stress Concern Present (03/16/2023)   [employer] of Occupational Health - Occupational Stress Questionnaire    Feeling of Stress : Only a little  Social  Connections: Socially Integrated (07/13/2023)   Social Connection and Isolation Panel [NHANES]    Frequency of Communication with Friends and Family: More than three times a week    Frequency of Social Gatherings with Friends and Family: Once a week    Attends Religious Services: More than 4 times per year    Active Member of [employer] or Organizations: Yes    Attends Engineer, structural: More than 4 times per year    Marital Status: Married  Catering manager Violence: Not At Risk (07/13/2023)   Humiliation, Afraid, Rape, and Kick questionnaire    Fear of Current or Ex-Partner: No    Emotionally Abused: No    Physically Abused: No    Sexually Abused: No   Current Outpatient Medications on File Prior to Visit  Medication Sig Dispense Refill   acetaminophen  (TYLENOL ) 500 MG tablet Take 2 tablets (1,000 mg total) by mouth every 6 (six) hours as needed for mild pain (pain score 1-3). 100 tablet 0   alfuzosin  (UROXATRAL ) 10 MG 24 hr tablet Take 1 tablet (10 mg total) by mouth daily with breakfast. (Patient taking differently: Take 10 mg by mouth daily.) 90 tablet 3   aspirin  325 MG tablet Take 1 tablet (325 mg total) by mouth daily. 45 tablet 0   chlorhexidine  (HIBICLENS ) 4 % external liquid Apply 15 mLs (1 Application total) topically as directed for 30 doses. Use as directed daily for 5 days every other week for 6 weeks. 946 mL 1   clonazePAM  (KLONOPIN ) 0.5 MG tablet Take 1.5 mg by mouth at bedtime.     Continuous Glucose Sensor (DEXCOM G7 SENSOR) MISC Use to monitor glucose continuously, change every 10 days 6 each 3   ferrous sulfate  325 (65 FE) MG tablet Take 1 tablet (325 mg total) by mouth 2 (two) times daily with a meal. 60 tablet 2   FLUoxetine  (PROZAC ) 20 MG capsule Take 20 mg by mouth daily.     glucose blood test strip 1 each by Other route in the morning and at bedtime. Use as instructed checking once per day, or if new symptoms. 100 each 1   hydrOXYzine  (ATARAX /VISTARIL ) 50 MG  tablet  Take 50 mg by mouth at bedtime as needed (sleep/anxiety.).     insulin  aspart (NOVOLOG ) 100 UNIT/ML injection Inject 4 Units into the skin 3 (three) times daily with meals. (Patient taking differently: Inject 4 Units into the skin 3 (three) times daily with meals. 2nd order: BS 0-150 inject 0 units  BS 151-200 inject 2 units  BS 201-250 inject 4 units  BS 251-300 inject 6 units BS 301-350 inject 8 units  BS 351-400 inject 10 units  BS 401- 12 units)     insulin  glargine (LANTUS  SOLOSTAR) 100 UNIT/ML Solostar Pen Inject 12 Units into the skin at bedtime.     Insulin  Pen Needle 32G X 4 MM MISC Use 2x a day 200 each 3   ketoconazole  (NIZORAL ) 2 % shampoo APPLY TO SCALP AND FACE 2 TO 3 TIMES A WEEK. (Patient taking differently: Apply 1 Application topically every Monday, Wednesday, and Friday.) 120 mL 0   lipase/protease/amylase (CREON ) 36000 UNITS CPEP capsule Take 2 capsules (72,000 Units total) by mouth 3 (three) times daily before meals. 180 capsule 5   lipase/protease/amylase (CREON ) 36000 UNITS CPEP capsule Take 36,000 Units by mouth every 12 (twelve) hours as needed (pancreatic insufficiency in between meals with snacks).     megestrol  (MEGACE ) 400 MG/10ML suspension Take 10 mLs (400 mg total) by mouth daily. 240 mL 0   methocarbamol  (ROBAXIN ) 500 MG tablet Take 1 tablet (500 mg total) by mouth 4 (four) times daily. 28 tablet 0   mupirocin  ointment (BACTROBAN ) 2 % Place 1 Application into the nose 2 (two) times daily for 60 doses. Use as directed 2 times daily for 5 days every other week for 6 weeks. 60 g 0   oxyCODONE  (OXY IR/ROXICODONE ) 5 MG immediate release tablet Take 1 tablet (5 mg total) by mouth every 4 (four) hours as needed for severe pain (pain score 7-10), breakthrough pain or moderate pain (pain score 4-6). 42 tablet 0   pantoprazole  (PROTONIX ) 40 MG tablet Take 1 tablet (40 mg total) by mouth at bedtime. 30 tablet 3   polyethylene glycol (MIRALAX  / GLYCOLAX ) 17 g packet Take  17 g by mouth daily as needed (constipation.).     prochlorperazine  (COMPAZINE ) 10 MG tablet Take 1 tablet (10 mg total) by mouth every 6 (six) hours as needed for nausea or vomiting (Use for nausea and / or vomiting unresolved with ondansetron  (Zofran ).). 30 tablet 0   rosuvastatin  (CRESTOR ) 20 MG tablet Take 1 tablet (20 mg total) by mouth daily. (Patient taking differently: Take 20 mg by mouth at bedtime.) 90 tablet 3   senna-docusate (SENOKOT-S) 8.6-50 MG tablet Take 1 tablet by mouth 2 (two) times daily. 10 tablet 0   simethicone  (MYLICON) 80 MG chewable tablet Chew 1 tablet (80 mg total) by mouth every 6 (six) hours as needed for flatulence. 50 tablet 2   UNABLE TO FIND Take 1 tablet by mouth daily. Med Name: Decubivite     No current facility-administered medications on file prior to visit.   No Known Allergies Family History  Problem Relation Age of Onset   Lung cancer Mother    Heart disease Father    Hyperlipidemia Father    Diabetes Father        type 1   Diabetes Brother    Depression Brother    Renal cancer Brother    Anxiety disorder Brother    Stomach cancer Neg Hx    Colon cancer Neg Hx    Pancreatic cancer Neg  Hx    Esophageal cancer Neg Hx    Rectal cancer Neg Hx    PE: BP 120/70   Pulse 88   Ht 5\' 11"  (1.803 m)   Wt 145 lb (65.8 kg)   SpO2 98%   BMI 20.22 kg/m  Wt Readings from Last 15 Encounters:  08/11/23 145 lb (65.8 kg)  07/13/23 (P) 150 lb (68 kg)  06/26/23 149 lb (67.6 kg)  06/20/23 148 lb (67.1 kg)  06/08/23 154 lb 8.7 oz (70.1 kg)  04/27/23 178 lb (80.7 kg)  04/21/23 178 lb 4.8 oz (80.9 kg)  04/18/23 175 lb 5 oz (79.5 kg)  03/20/23 176 lb 2 oz (79.9 kg)  03/14/23 176 lb 12.8 oz (80.2 kg)  02/13/23 177 lb (80.3 kg)  02/09/23 177 lb 6.4 oz (80.5 kg)  02/08/23 179 lb (81.2 kg)  01/20/23 178 lb (80.7 kg)  01/16/23 180 lb 8 oz (81.9 kg)   Constitutional: frail, thin, in NAD, in wheelchair Eyes:  EOMI, no exophthalmos ENT: no neck masses, no  cervical lymphadenopathy Cardiovascular: RRR, No MRG Respiratory: CTA B Musculoskeletal: no deformities Skin:no rashes Neurological: no tremor with outstretched hands  ASSESSMENT: 1. DM2, insulin -dependent, uncontrolled, with complications - PN - aortic atherosclerosis (on CT abdomen and pelvis from 12/08/2022)  2. HL  PLAN:  1. Patient with longstanding, uncontrolled, type 2 diabetes, on oral antidiabetic regimen with metformin , SGLT2 inhibitor, and basal-bolus insulin  regimen, with worsening control after his Whipple procedure 4 months ago. CGM interpretation: -At today's visit, we reviewed his CGM downloads: It appears that 31% of values are in target range (goal >70%), while 69% are higher than 180 (goal <25%), and 0% are lower than 70 (goal <4%).  The calculated average blood sugar is 228.  The projected HbA1c for the next 3 months (GMI) is 8.8%. -Reviewing the CGM trends, sugars are much higher than before, increasing significantly after breakfast and remaining elevated until the half of the night.  Blood sugars to start to improve after dinner, but they are still mostly above 180 after this meal.  Upon questioning, he is off metformin  and Jardiance , both stopped approximately 2 months ago during the hospitalization.  He is taking Lantus  at night and Lyumjev  only before lunch, a lower dose than last visit. -At today's visit, I did not suggest to start back on metformin  although his kidney function is normal, to avoid further weight loss.  However, I feel that he would benefit from Jardiance  but a lower dose, of 10 mg daily.  I also recommend to increase the Lantus  dose and move it to the morning to help with blood sugars during the day and he does need Lyumjev  before each of the meals.  Since he is in the facility, dose adjustment is cumbersome so I recommended to use 7 units.  We did discuss that if sugars are lower after meals, we can back off the dose.  They will let me know. - I  suggested to:  Patient Instructions  Please restart: - Jardiance  10 mg before breakfast  Increase: - Lantus  14 units - in am  Please change: - Lyumjev  7 units before all meals  Please return in 2 months.  - we checked his HbA1c: 7.3% (surprisingly low compared to the CGM data) - advised to check sugars at different times of the day - 4x a day, rotating check times - advised for yearly eye exams >> he is UTD - return to clinic in 2  months  2. HL - Reviewed latest lipid panel from a year ago: HDL low, LDL slightly above target of less than 55: Lab Results  Component Value Date   CHOL 126 07/25/2022   HDL 38.40 (L) 07/25/2022   LDLCALC 66 07/25/2022   TRIG 112.0 07/25/2022   CHOLHDL 3 07/25/2022  - He continues Crestor  20 mg daily without side effects - we we will recheck his lipid panel at next visit  Emilie Harden, MD PhD [facility] Endocrinology

## 2023-08-11 NOTE — Patient Instructions (Addendum)
 Please restart: - Jardiance  10 mg before breakfast  Increase: - Lantus  14 units - in am  Please change: - Lyumjev  7 units before all meals  Please return in 2 months.

## 2023-08-15 ENCOUNTER — Telehealth: Payer: Self-pay | Admitting: *Deleted

## 2023-08-15 DIAGNOSIS — Z85828 Personal history of other malignant neoplasm of skin: Secondary | ICD-10-CM | POA: Diagnosis not present

## 2023-08-15 DIAGNOSIS — E440 Moderate protein-calorie malnutrition: Secondary | ICD-10-CM | POA: Diagnosis not present

## 2023-08-15 DIAGNOSIS — F32A Depression, unspecified: Secondary | ICD-10-CM | POA: Diagnosis not present

## 2023-08-15 DIAGNOSIS — N319 Neuromuscular dysfunction of bladder, unspecified: Secondary | ICD-10-CM | POA: Diagnosis not present

## 2023-08-15 DIAGNOSIS — E1136 Type 2 diabetes mellitus with diabetic cataract: Secondary | ICD-10-CM | POA: Diagnosis not present

## 2023-08-15 DIAGNOSIS — E785 Hyperlipidemia, unspecified: Secondary | ICD-10-CM | POA: Diagnosis not present

## 2023-08-15 DIAGNOSIS — C7A8 Other malignant neuroendocrine tumors: Secondary | ICD-10-CM | POA: Diagnosis not present

## 2023-08-15 DIAGNOSIS — F419 Anxiety disorder, unspecified: Secondary | ICD-10-CM | POA: Diagnosis not present

## 2023-08-15 DIAGNOSIS — C179 Malignant neoplasm of small intestine, unspecified: Secondary | ICD-10-CM | POA: Diagnosis not present

## 2023-08-15 DIAGNOSIS — Z96642 Presence of left artificial hip joint: Secondary | ICD-10-CM | POA: Diagnosis not present

## 2023-08-15 DIAGNOSIS — I10 Essential (primary) hypertension: Secondary | ICD-10-CM | POA: Diagnosis not present

## 2023-08-15 DIAGNOSIS — D500 Iron deficiency anemia secondary to blood loss (chronic): Secondary | ICD-10-CM | POA: Diagnosis not present

## 2023-08-15 DIAGNOSIS — Z9181 History of falling: Secondary | ICD-10-CM | POA: Diagnosis not present

## 2023-08-15 DIAGNOSIS — E1165 Type 2 diabetes mellitus with hyperglycemia: Secondary | ICD-10-CM | POA: Diagnosis not present

## 2023-08-15 DIAGNOSIS — E872 Acidosis, unspecified: Secondary | ICD-10-CM | POA: Diagnosis not present

## 2023-08-15 DIAGNOSIS — R1312 Dysphagia, oropharyngeal phase: Secondary | ICD-10-CM | POA: Diagnosis not present

## 2023-08-15 DIAGNOSIS — Z87891 Personal history of nicotine dependence: Secondary | ICD-10-CM | POA: Diagnosis not present

## 2023-08-15 DIAGNOSIS — E1142 Type 2 diabetes mellitus with diabetic polyneuropathy: Secondary | ICD-10-CM | POA: Diagnosis not present

## 2023-08-15 DIAGNOSIS — Z794 Long term (current) use of insulin: Secondary | ICD-10-CM | POA: Diagnosis not present

## 2023-08-15 DIAGNOSIS — S72002D Fracture of unspecified part of neck of left femur, subsequent encounter for closed fracture with routine healing: Secondary | ICD-10-CM | POA: Diagnosis not present

## 2023-08-15 NOTE — Telephone Encounter (Signed)
 Copied from CRM [id number]. Topic: Clinical - Home Health Verbal Orders >> Jul 20, 2023  2:37 PM Winnifred Havers wrote: Caller/Agency: [facility]  Callback Number [phone] Service Requested: Speech Therapy Frequency: 1 week 6 Any new concerns about the patient? No >> Aug 15, 2023  9:42 AM Lizabeth Riggs wrote: Please call Stafford Eagles with [facility] at [phone]. She still needs a verbal for Speech Therapy for 1 time weekly for 6 weeks. No new concerns.   Left message to return my call.

## 2023-08-15 NOTE — Telephone Encounter (Signed)
 Returned call to [place], gave verbal orders as requested.

## 2023-08-15 NOTE — Telephone Encounter (Signed)
 Stafford Eagles with [facility] returned call. Requests to be called at ph# [phone]

## 2023-08-16 DIAGNOSIS — E1165 Type 2 diabetes mellitus with hyperglycemia: Secondary | ICD-10-CM | POA: Diagnosis not present

## 2023-08-16 DIAGNOSIS — I10 Essential (primary) hypertension: Secondary | ICD-10-CM | POA: Diagnosis not present

## 2023-08-16 DIAGNOSIS — F32A Depression, unspecified: Secondary | ICD-10-CM | POA: Diagnosis not present

## 2023-08-16 DIAGNOSIS — E1142 Type 2 diabetes mellitus with diabetic polyneuropathy: Secondary | ICD-10-CM | POA: Diagnosis not present

## 2023-08-16 DIAGNOSIS — D500 Iron deficiency anemia secondary to blood loss (chronic): Secondary | ICD-10-CM | POA: Diagnosis not present

## 2023-08-16 DIAGNOSIS — C179 Malignant neoplasm of small intestine, unspecified: Secondary | ICD-10-CM | POA: Diagnosis not present

## 2023-08-16 DIAGNOSIS — Z85828 Personal history of other malignant neoplasm of skin: Secondary | ICD-10-CM | POA: Diagnosis not present

## 2023-08-16 DIAGNOSIS — F419 Anxiety disorder, unspecified: Secondary | ICD-10-CM | POA: Diagnosis not present

## 2023-08-16 DIAGNOSIS — E785 Hyperlipidemia, unspecified: Secondary | ICD-10-CM | POA: Diagnosis not present

## 2023-08-16 DIAGNOSIS — R1312 Dysphagia, oropharyngeal phase: Secondary | ICD-10-CM | POA: Diagnosis not present

## 2023-08-16 DIAGNOSIS — Z9181 History of falling: Secondary | ICD-10-CM | POA: Diagnosis not present

## 2023-08-16 DIAGNOSIS — Z96642 Presence of left artificial hip joint: Secondary | ICD-10-CM | POA: Diagnosis not present

## 2023-08-16 DIAGNOSIS — E872 Acidosis, unspecified: Secondary | ICD-10-CM | POA: Diagnosis not present

## 2023-08-16 DIAGNOSIS — S72002D Fracture of unspecified part of neck of left femur, subsequent encounter for closed fracture with routine healing: Secondary | ICD-10-CM | POA: Diagnosis not present

## 2023-08-16 DIAGNOSIS — Z87891 Personal history of nicotine dependence: Secondary | ICD-10-CM | POA: Diagnosis not present

## 2023-08-16 DIAGNOSIS — E1136 Type 2 diabetes mellitus with diabetic cataract: Secondary | ICD-10-CM | POA: Diagnosis not present

## 2023-08-16 DIAGNOSIS — Z794 Long term (current) use of insulin: Secondary | ICD-10-CM | POA: Diagnosis not present

## 2023-08-16 DIAGNOSIS — N319 Neuromuscular dysfunction of bladder, unspecified: Secondary | ICD-10-CM | POA: Diagnosis not present

## 2023-08-16 DIAGNOSIS — E440 Moderate protein-calorie malnutrition: Secondary | ICD-10-CM | POA: Diagnosis not present

## 2023-08-16 DIAGNOSIS — C7A8 Other malignant neuroendocrine tumors: Secondary | ICD-10-CM | POA: Diagnosis not present

## 2023-08-19 DIAGNOSIS — E1140 Type 2 diabetes mellitus with diabetic neuropathy, unspecified: Secondary | ICD-10-CM | POA: Diagnosis not present

## 2023-08-19 DIAGNOSIS — G709 Myoneural disorder, unspecified: Secondary | ICD-10-CM | POA: Diagnosis not present

## 2023-08-19 DIAGNOSIS — S72002A Fracture of unspecified part of neck of left femur, initial encounter for closed fracture: Secondary | ICD-10-CM | POA: Diagnosis not present

## 2023-08-19 DIAGNOSIS — C7A8 Other malignant neuroendocrine tumors: Secondary | ICD-10-CM | POA: Diagnosis not present

## 2023-08-22 ENCOUNTER — Telehealth: Payer: Self-pay | Admitting: Family Medicine

## 2023-08-22 NOTE — Telephone Encounter (Signed)
 Patient's wife request patient to be added to the waitlist. Wife also requested to change the contact info due to patient do not answer phone numbers do not know.

## 2023-08-23 DIAGNOSIS — C179 Malignant neoplasm of small intestine, unspecified: Secondary | ICD-10-CM | POA: Diagnosis not present

## 2023-08-23 DIAGNOSIS — D500 Iron deficiency anemia secondary to blood loss (chronic): Secondary | ICD-10-CM | POA: Diagnosis not present

## 2023-08-23 DIAGNOSIS — E872 Acidosis, unspecified: Secondary | ICD-10-CM | POA: Diagnosis not present

## 2023-08-23 DIAGNOSIS — E785 Hyperlipidemia, unspecified: Secondary | ICD-10-CM | POA: Diagnosis not present

## 2023-08-23 DIAGNOSIS — Z96642 Presence of left artificial hip joint: Secondary | ICD-10-CM | POA: Diagnosis not present

## 2023-08-23 DIAGNOSIS — I10 Essential (primary) hypertension: Secondary | ICD-10-CM | POA: Diagnosis not present

## 2023-08-23 DIAGNOSIS — S72002D Fracture of unspecified part of neck of left femur, subsequent encounter for closed fracture with routine healing: Secondary | ICD-10-CM | POA: Diagnosis not present

## 2023-08-23 DIAGNOSIS — C7A8 Other malignant neuroendocrine tumors: Secondary | ICD-10-CM | POA: Diagnosis not present

## 2023-08-23 DIAGNOSIS — F32A Depression, unspecified: Secondary | ICD-10-CM | POA: Diagnosis not present

## 2023-08-23 DIAGNOSIS — Z87891 Personal history of nicotine dependence: Secondary | ICD-10-CM | POA: Diagnosis not present

## 2023-08-23 DIAGNOSIS — Z9181 History of falling: Secondary | ICD-10-CM | POA: Diagnosis not present

## 2023-08-23 DIAGNOSIS — N319 Neuromuscular dysfunction of bladder, unspecified: Secondary | ICD-10-CM | POA: Diagnosis not present

## 2023-08-23 DIAGNOSIS — E440 Moderate protein-calorie malnutrition: Secondary | ICD-10-CM | POA: Diagnosis not present

## 2023-08-23 DIAGNOSIS — Z794 Long term (current) use of insulin: Secondary | ICD-10-CM | POA: Diagnosis not present

## 2023-08-23 DIAGNOSIS — Z85828 Personal history of other malignant neoplasm of skin: Secondary | ICD-10-CM | POA: Diagnosis not present

## 2023-08-23 DIAGNOSIS — E1142 Type 2 diabetes mellitus with diabetic polyneuropathy: Secondary | ICD-10-CM | POA: Diagnosis not present

## 2023-08-23 DIAGNOSIS — R1312 Dysphagia, oropharyngeal phase: Secondary | ICD-10-CM | POA: Diagnosis not present

## 2023-08-23 DIAGNOSIS — E1136 Type 2 diabetes mellitus with diabetic cataract: Secondary | ICD-10-CM | POA: Diagnosis not present

## 2023-08-23 DIAGNOSIS — F419 Anxiety disorder, unspecified: Secondary | ICD-10-CM | POA: Diagnosis not present

## 2023-08-23 DIAGNOSIS — E1165 Type 2 diabetes mellitus with hyperglycemia: Secondary | ICD-10-CM | POA: Diagnosis not present

## 2023-08-24 ENCOUNTER — Telehealth: Payer: Self-pay | Admitting: *Deleted

## 2023-08-24 DIAGNOSIS — E1142 Type 2 diabetes mellitus with diabetic polyneuropathy: Secondary | ICD-10-CM | POA: Diagnosis not present

## 2023-08-24 DIAGNOSIS — S72002D Fracture of unspecified part of neck of left femur, subsequent encounter for closed fracture with routine healing: Secondary | ICD-10-CM | POA: Diagnosis not present

## 2023-08-24 DIAGNOSIS — C179 Malignant neoplasm of small intestine, unspecified: Secondary | ICD-10-CM | POA: Diagnosis not present

## 2023-08-24 DIAGNOSIS — Z96642 Presence of left artificial hip joint: Secondary | ICD-10-CM | POA: Diagnosis not present

## 2023-08-24 DIAGNOSIS — R350 Frequency of micturition: Secondary | ICD-10-CM | POA: Diagnosis not present

## 2023-08-24 DIAGNOSIS — N319 Neuromuscular dysfunction of bladder, unspecified: Secondary | ICD-10-CM | POA: Diagnosis not present

## 2023-08-24 DIAGNOSIS — D500 Iron deficiency anemia secondary to blood loss (chronic): Secondary | ICD-10-CM | POA: Diagnosis not present

## 2023-08-24 DIAGNOSIS — F419 Anxiety disorder, unspecified: Secondary | ICD-10-CM | POA: Diagnosis not present

## 2023-08-24 DIAGNOSIS — R3915 Urgency of urination: Secondary | ICD-10-CM | POA: Diagnosis not present

## 2023-08-24 DIAGNOSIS — F32A Depression, unspecified: Secondary | ICD-10-CM | POA: Diagnosis not present

## 2023-08-24 DIAGNOSIS — E1136 Type 2 diabetes mellitus with diabetic cataract: Secondary | ICD-10-CM | POA: Diagnosis not present

## 2023-08-24 DIAGNOSIS — E872 Acidosis, unspecified: Secondary | ICD-10-CM | POA: Diagnosis not present

## 2023-08-24 DIAGNOSIS — E1165 Type 2 diabetes mellitus with hyperglycemia: Secondary | ICD-10-CM | POA: Diagnosis not present

## 2023-08-24 DIAGNOSIS — R1312 Dysphagia, oropharyngeal phase: Secondary | ICD-10-CM | POA: Diagnosis not present

## 2023-08-24 DIAGNOSIS — I10 Essential (primary) hypertension: Secondary | ICD-10-CM | POA: Diagnosis not present

## 2023-08-24 DIAGNOSIS — E440 Moderate protein-calorie malnutrition: Secondary | ICD-10-CM | POA: Diagnosis not present

## 2023-08-24 DIAGNOSIS — Z794 Long term (current) use of insulin: Secondary | ICD-10-CM | POA: Diagnosis not present

## 2023-08-24 DIAGNOSIS — Z87891 Personal history of nicotine dependence: Secondary | ICD-10-CM | POA: Diagnosis not present

## 2023-08-24 DIAGNOSIS — R351 Nocturia: Secondary | ICD-10-CM | POA: Diagnosis not present

## 2023-08-24 DIAGNOSIS — Z9181 History of falling: Secondary | ICD-10-CM | POA: Diagnosis not present

## 2023-08-24 DIAGNOSIS — N401 Enlarged prostate with lower urinary tract symptoms: Secondary | ICD-10-CM | POA: Diagnosis not present

## 2023-08-24 DIAGNOSIS — Z85828 Personal history of other malignant neoplasm of skin: Secondary | ICD-10-CM | POA: Diagnosis not present

## 2023-08-24 DIAGNOSIS — C7A8 Other malignant neuroendocrine tumors: Secondary | ICD-10-CM | POA: Diagnosis not present

## 2023-08-24 DIAGNOSIS — E785 Hyperlipidemia, unspecified: Secondary | ICD-10-CM | POA: Diagnosis not present

## 2023-08-24 NOTE — Telephone Encounter (Signed)
 Copied from CRM [id number]. Topic: Clinical - Home Health Verbal Orders >> Aug 24, 2023  3:42 PM Albertha Alosa wrote: Caller/Agency: [facility] Number: [phone] Service Requested: Speech Therapy Frequency: 1 week for 7 weeks  Any new concerns about the patient? No

## 2023-08-25 DIAGNOSIS — E785 Hyperlipidemia, unspecified: Secondary | ICD-10-CM | POA: Diagnosis not present

## 2023-08-25 DIAGNOSIS — C7A8 Other malignant neuroendocrine tumors: Secondary | ICD-10-CM | POA: Diagnosis not present

## 2023-08-25 DIAGNOSIS — E1165 Type 2 diabetes mellitus with hyperglycemia: Secondary | ICD-10-CM | POA: Diagnosis not present

## 2023-08-25 DIAGNOSIS — C179 Malignant neoplasm of small intestine, unspecified: Secondary | ICD-10-CM | POA: Diagnosis not present

## 2023-08-25 DIAGNOSIS — Z87891 Personal history of nicotine dependence: Secondary | ICD-10-CM | POA: Diagnosis not present

## 2023-08-25 DIAGNOSIS — S72002D Fracture of unspecified part of neck of left femur, subsequent encounter for closed fracture with routine healing: Secondary | ICD-10-CM | POA: Diagnosis not present

## 2023-08-25 DIAGNOSIS — Z9181 History of falling: Secondary | ICD-10-CM | POA: Diagnosis not present

## 2023-08-25 DIAGNOSIS — F32A Depression, unspecified: Secondary | ICD-10-CM | POA: Diagnosis not present

## 2023-08-25 DIAGNOSIS — E872 Acidosis, unspecified: Secondary | ICD-10-CM | POA: Diagnosis not present

## 2023-08-25 DIAGNOSIS — Z794 Long term (current) use of insulin: Secondary | ICD-10-CM | POA: Diagnosis not present

## 2023-08-25 DIAGNOSIS — Z85828 Personal history of other malignant neoplasm of skin: Secondary | ICD-10-CM | POA: Diagnosis not present

## 2023-08-25 DIAGNOSIS — Z96642 Presence of left artificial hip joint: Secondary | ICD-10-CM | POA: Diagnosis not present

## 2023-08-25 DIAGNOSIS — E1142 Type 2 diabetes mellitus with diabetic polyneuropathy: Secondary | ICD-10-CM | POA: Diagnosis not present

## 2023-08-25 DIAGNOSIS — R1312 Dysphagia, oropharyngeal phase: Secondary | ICD-10-CM | POA: Diagnosis not present

## 2023-08-25 DIAGNOSIS — D500 Iron deficiency anemia secondary to blood loss (chronic): Secondary | ICD-10-CM | POA: Diagnosis not present

## 2023-08-25 DIAGNOSIS — E1136 Type 2 diabetes mellitus with diabetic cataract: Secondary | ICD-10-CM | POA: Diagnosis not present

## 2023-08-25 DIAGNOSIS — N319 Neuromuscular dysfunction of bladder, unspecified: Secondary | ICD-10-CM | POA: Diagnosis not present

## 2023-08-25 DIAGNOSIS — F419 Anxiety disorder, unspecified: Secondary | ICD-10-CM | POA: Diagnosis not present

## 2023-08-25 DIAGNOSIS — E440 Moderate protein-calorie malnutrition: Secondary | ICD-10-CM | POA: Diagnosis not present

## 2023-08-25 DIAGNOSIS — I10 Essential (primary) hypertension: Secondary | ICD-10-CM | POA: Diagnosis not present

## 2023-08-25 NOTE — Telephone Encounter (Signed)
 Verbal orders given to [facility] as requested. Patient scheduled for vv for 08/28/23.

## 2023-08-28 ENCOUNTER — Encounter: Payer: Self-pay | Admitting: Family Medicine

## 2023-08-28 ENCOUNTER — Telehealth: Admitting: Family Medicine

## 2023-08-28 DIAGNOSIS — F32A Depression, unspecified: Secondary | ICD-10-CM | POA: Diagnosis not present

## 2023-08-28 DIAGNOSIS — N319 Neuromuscular dysfunction of bladder, unspecified: Secondary | ICD-10-CM | POA: Diagnosis not present

## 2023-08-28 DIAGNOSIS — F419 Anxiety disorder, unspecified: Secondary | ICD-10-CM | POA: Diagnosis not present

## 2023-08-28 DIAGNOSIS — I10 Essential (primary) hypertension: Secondary | ICD-10-CM | POA: Diagnosis not present

## 2023-08-28 DIAGNOSIS — E1165 Type 2 diabetes mellitus with hyperglycemia: Secondary | ICD-10-CM

## 2023-08-28 DIAGNOSIS — E440 Moderate protein-calorie malnutrition: Secondary | ICD-10-CM | POA: Diagnosis not present

## 2023-08-28 DIAGNOSIS — Z7982 Long term (current) use of aspirin: Secondary | ICD-10-CM | POA: Diagnosis not present

## 2023-08-28 DIAGNOSIS — E785 Hyperlipidemia, unspecified: Secondary | ICD-10-CM | POA: Diagnosis not present

## 2023-08-28 DIAGNOSIS — Z794 Long term (current) use of insulin: Secondary | ICD-10-CM | POA: Diagnosis not present

## 2023-08-28 DIAGNOSIS — Z7984 Long term (current) use of oral hypoglycemic drugs: Secondary | ICD-10-CM

## 2023-08-28 DIAGNOSIS — E1159 Type 2 diabetes mellitus with other circulatory complications: Secondary | ICD-10-CM

## 2023-08-28 DIAGNOSIS — G609 Hereditary and idiopathic neuropathy, unspecified: Secondary | ICD-10-CM

## 2023-08-28 DIAGNOSIS — R1312 Dysphagia, oropharyngeal phase: Secondary | ICD-10-CM | POA: Diagnosis not present

## 2023-08-28 DIAGNOSIS — Z96642 Presence of left artificial hip joint: Secondary | ICD-10-CM | POA: Diagnosis not present

## 2023-08-28 DIAGNOSIS — D500 Iron deficiency anemia secondary to blood loss (chronic): Secondary | ICD-10-CM | POA: Diagnosis not present

## 2023-08-28 DIAGNOSIS — Z85828 Personal history of other malignant neoplasm of skin: Secondary | ICD-10-CM | POA: Diagnosis not present

## 2023-08-28 DIAGNOSIS — C179 Malignant neoplasm of small intestine, unspecified: Secondary | ICD-10-CM | POA: Diagnosis not present

## 2023-08-28 DIAGNOSIS — C7A8 Other malignant neuroendocrine tumors: Secondary | ICD-10-CM | POA: Diagnosis not present

## 2023-08-28 DIAGNOSIS — E1142 Type 2 diabetes mellitus with diabetic polyneuropathy: Secondary | ICD-10-CM | POA: Diagnosis not present

## 2023-08-28 DIAGNOSIS — Z87891 Personal history of nicotine dependence: Secondary | ICD-10-CM | POA: Diagnosis not present

## 2023-08-28 DIAGNOSIS — Z9181 History of falling: Secondary | ICD-10-CM | POA: Diagnosis not present

## 2023-08-28 DIAGNOSIS — E1136 Type 2 diabetes mellitus with diabetic cataract: Secondary | ICD-10-CM | POA: Diagnosis not present

## 2023-08-28 DIAGNOSIS — S72002D Fracture of unspecified part of neck of left femur, subsequent encounter for closed fracture with routine healing: Secondary | ICD-10-CM | POA: Diagnosis not present

## 2023-08-28 NOTE — Progress Notes (Signed)
 MyChart Video Visit Virtual Visit via Video Note   This visit type was conducted w/patient consent. This format is felt to be most appropriate for this patient at this time. Physical exam was limited by quality of the video and audio technology used for the visit. CMA was able to get the patient set up on a video visit.  Patient location: [facility] Patient and provider in visit and wife Andrew Tanner Provider location: Office  I discussed the limitations of evaluation and management by telemedicine and the availability of in person appointments. The patient expressed understanding and agreed to proceed.  Visit Date: 08/28/2023  Today's healthcare provider: Ellsworth Haas, MD     Subjective:    Patient ID: Andrew Tanner, male    DOB: 1950-10-25, 73 y.o.   MRN: [id number]  Chief Complaint  Patient presents with   Medical Management of Chronic Issues    Follow-up     HPI [facility]  Gemtesa Ha-intermitt-frontal. Low grade pain. - neuro Tremors. Discussed the use of AI scribe software for clinical note transcription with the patient, who gave verbal consent to proceed.  History of Present Illness Andrew Tanner is a 73 year old male who presents for follow-up on his post-surgical recovery and management of diabetes. He is accompanied by Andrew Tanner, his primary caregiver.  He underwent a Whipple procedure in January and has been on insulin  therapy since. He experienced weakness and balance issues, leading to multiple falls. On April 1st, he suffered a hip fracture, resulting in a left hip replacement on April 2nd. He is currently receiving home physical therapy, with sessions occurring one to two times a week. He has difficulty remembering to perform exercises independently.  He manages his diabetes with insulin  and was recently started on Jardiance  10 mg. He has adjusted his diet to better control his blood sugar, which previously spiked to 350 mg/dL. He now consumes a smaller  portion of sugary cereals and balances it with protein-rich foods. His blood sugar levels have stabilized, typically ranging from 117 to 130 mg/dL.  He experiences frequent urination, impacting his sleep, and has been prescribed Gemtesa to manage this symptom. He notes some improvement, with reduced nighttime awakenings.  Managed by urology  He experiences intermittent frontal headaches lasting up to a day, without associated visual changes or sensitivity to light and sound. These headaches began after multiple falls, during which he sustained head injuries. He has had five head CT scans, the last on March 17th.  He has a history of significant weight loss, having lost 40 pounds during a hospital stay in February. Despite efforts, he has not regained the weight. He continues to engage in artwork, which he finds therapeutic.  His current medications include Lantus , Lyumjev , Creon , fluoxetine  20 mg, hydroxyzine  50 mg at bedtime, oxycodone  as needed, rosuvastatin  20 mg, and Tylenol  as needed for headaches. He is no longer taking iron , pantoprazole , or Senokot.    Past Medical History:  Diagnosis Date   Anxiety    Atrial myxoma    Basal cell carcinoma    Cancer (HCC)    basal cell skin CA   Cataract    Depression    Diabetes mellitus without complication (HCC)    Diabetic neuropathy (HCC)    Duodenal adenoma    Hyperlipidemia    Hypertension    IC (interstitial cystitis)    Neuromuscular disorder (HCC) 01/09/21   Peripheral  Neuropathy   Pancreatic lesion    Squamous cell carcinoma of  skin     Past Surgical History:  Procedure Laterality Date   BIOPSY  12/05/2022   Procedure: BIOPSY;  Surgeon: Brice Campi Albino Alu., MD;  Location: [facility];  Service: Gastroenterology;;   CATARACT EXTRACTION, BILATERAL     ESOPHAGOGASTRODUODENOSCOPY N/A 12/05/2022   Procedure: ESOPHAGOGASTRODUODENOSCOPY (EGD);  Surgeon: Normie Becton., MD;  Location: [facility];  Service:  Gastroenterology;  Laterality: N/A;   EUS N/A 12/05/2022   Procedure: UPPER ENDOSCOPIC ULTRASOUND (EUS) RADIAL;  Surgeon: Normie Becton., MD;  Location: [facility];  Service: Gastroenterology;  Laterality: N/A;   FINE NEEDLE ASPIRATION N/A 12/05/2022   Procedure: FINE NEEDLE ASPIRATION (FNA) LINEAR;  Surgeon: Normie Becton., MD;  Location: [facility];  Service: Gastroenterology;  Laterality: N/A;   HERNIA REPAIR N/A    inguinal   NECK SURGERY     ant/post   SPINE SURGERY     SUBMUCOSAL TATTOO INJECTION  12/05/2022   Procedure: SUBMUCOSAL TATTOO INJECTION;  Surgeon: Normie Becton., MD;  Location: [facility];  Service: Gastroenterology;;   TOTAL HIP ARTHROPLASTY Left 07/13/2023   Procedure: ARTHROPLASTY, HIP, TOTAL, ANTERIOR APPROACH;  Surgeon: Laneta Pintos, MD;  Location: [facility];  Service: Orthopedics;  Laterality: Left;  [employer] bipolar Hana table  hemi hip   TRANSURETHRAL RESECTION OF PROSTATE     TURP VAPORIZATION     WHIPPLE PROCEDURE N/A 04/27/2023   Procedure: WHIPPLE PROCEDURE;  Surgeon: Lockie Rima, MD;  Location: [facility];  Service: General;  Laterality: N/A;    Outpatient Medications Prior to Visit  Medication Sig Dispense Refill   acetaminophen  (TYLENOL ) 500 MG tablet Take 2 tablets (1,000 mg total) by mouth every 6 (six) hours as needed for mild pain (pain score 1-3). 100 tablet 0   alfuzosin  (UROXATRAL ) 10 MG 24 hr tablet Take 1 tablet (10 mg total) by mouth daily with breakfast. (Patient taking differently: Take 10 mg by mouth daily.) 90 tablet 3   aspirin  325 MG tablet Take 1 tablet (325 mg total) by mouth daily. 45 tablet 0   clonazePAM  (KLONOPIN ) 0.5 MG tablet Take 1.5 mg by mouth at bedtime.     Continuous Glucose Sensor (DEXCOM G7 SENSOR) MISC Use to monitor glucose continuously, change every 10 days 6 each 3   empagliflozin  (JARDIANCE ) 10 MG TABS tablet Take 1 tablet (10 mg total) by mouth daily before breakfast. 90 tablet 3    FLUoxetine  (PROZAC ) 20 MG capsule Take 20 mg by mouth daily.     glucose blood test strip 1 each by Other route in the morning and at bedtime. Use as instructed checking once per day, or if new symptoms. 100 each 1   hydrOXYzine  (ATARAX /VISTARIL ) 50 MG tablet Take 50 mg by mouth at bedtime as needed (sleep/anxiety.).     insulin  glargine (LANTUS  SOLOSTAR) 100 UNIT/ML Solostar Pen Inject 14 Units into the skin daily. 30 mL 3   Insulin  Lispro-aabc (LYUMJEV  KWIKPEN) 100 UNIT/ML KwikPen Inject 7 Units into the skin 3 (three) times daily before meals. 30 mL 3   Insulin  Pen Needle 32G X 4 MM MISC Use 2x a day 200 each 3   ketoconazole  (NIZORAL ) 2 % shampoo APPLY TO SCALP AND FACE 2 TO 3 TIMES A WEEK. (Patient taking differently: Apply 1 Application topically every Monday, Wednesday, and Friday.) 120 mL 0   lipase/protease/amylase (CREON ) 36000 UNITS CPEP capsule Take 2 capsules (72,000 Units total) by mouth 3 (three) times daily before meals. 180 capsule 5   lipase/protease/amylase (CREON ) 36000  UNITS CPEP capsule Take 36,000 Units by mouth every 12 (twelve) hours as needed (pancreatic insufficiency in between meals with snacks).     oxyCODONE  (OXY IR/ROXICODONE ) 5 MG immediate release tablet Take 1 tablet (5 mg total) by mouth every 4 (four) hours as needed for severe pain (pain score 7-10), breakthrough pain or moderate pain (pain score 4-6). 42 tablet 0   polyethylene glycol (MIRALAX  / GLYCOLAX ) 17 g packet Take 17 g by mouth daily as needed (constipation.).     QUEtiapine (SEROQUEL) 25 MG tablet Take 25 mg by mouth at bedtime.     rosuvastatin  (CRESTOR ) 20 MG tablet Take 1 tablet (20 mg total) by mouth daily. (Patient taking differently: Take 20 mg by mouth at bedtime.) 90 tablet 3   simethicone  (MYLICON) 80 MG chewable tablet Chew 1 tablet (80 mg total) by mouth every 6 (six) hours as needed for flatulence. 50 tablet 2   UNABLE TO FIND Take 1 tablet by mouth daily. Med Name: Decubivite     Vibegron  (GEMTESA) 75 MG TABS Take by mouth.     chlorhexidine  (HIBICLENS ) 4 % external liquid Apply 15 mLs (1 Application total) topically as directed for 30 doses. Use as directed daily for 5 days every other week for 6 weeks. 946 mL 1   ferrous sulfate  325 (65 FE) MG tablet Take 1 tablet (325 mg total) by mouth 2 (two) times daily with a meal. 60 tablet 2   insulin  aspart (NOVOLOG ) 100 UNIT/ML injection Inject 4 Units into the skin 3 (three) times daily with meals. (Patient taking differently: Inject 4 Units into the skin 3 (three) times daily with meals. 2nd order: BS 0-150 inject 0 units  BS 151-200 inject 2 units  BS 201-250 inject 4 units  BS 251-300 inject 6 units BS 301-350 inject 8 units  BS 351-400 inject 10 units  BS 401- 12 units)     megestrol  (MEGACE ) 400 MG/10ML suspension Take 10 mLs (400 mg total) by mouth daily. 240 mL 0   methocarbamol  (ROBAXIN ) 500 MG tablet Take 1 tablet (500 mg total) by mouth 4 (four) times daily. 28 tablet 0   pantoprazole  (PROTONIX ) 40 MG tablet Take 1 tablet (40 mg total) by mouth at bedtime. 30 tablet 3   prochlorperazine  (COMPAZINE ) 10 MG tablet Take 1 tablet (10 mg total) by mouth every 6 (six) hours as needed for nausea or vomiting (Use for nausea and / or vomiting unresolved with ondansetron  (Zofran ).). 30 tablet 0   senna-docusate (SENOKOT-S) 8.6-50 MG tablet Take 1 tablet by mouth 2 (two) times daily. 10 tablet 0   No facility-administered medications prior to visit.    No Known Allergies      Objective:      Physical Exam  Vitals and nursing note reviewed.  Constitutional:      General:  is not in acute distress.    Appearance: Normal appearance.  HENT:     Head: Normocephalic.  Pulmonary:     Effort: No respiratory distress.  Skin:    General: Skin is dry.     Coloration: Skin is not pale.  Neurological:     Mental Status: Pt is alert and oriented to person, place, and time.  Psychiatric:        Mood and Affect: Mood normal.    There were no vitals taken for this visit.  Wt Readings from Last 3 Encounters:  08/11/23 145 lb (65.8 kg)  07/13/23 (P) 150 lb (68 kg)  06/26/23  149 lb (67.6 kg)       Assessment & Plan:   Problem List Items Addressed This Visit     History of left hip replacement   Malnutrition of moderate degree   Peripheral neuropathy   Relevant Medications   QUEtiapine (SEROQUEL) 25 MG tablet   Poorly controlled type 2 diabetes mellitus with circulatory disorder (HCC) - Primary   Other Visit Diagnoses       Encounter for long-term (current) use of insulin  (HCC)         Long term current use of oral hypoglycemic drug         Assessment and Plan Assessment & Plan Type 2 diabetes mellitus with hyperglycemia   Blood glucose levels have improved with dietary adjustments and insulin  and Jardiance , now ranging from 125 to 130 mg/dL. Although A1c was 7.3%, current glucose levels suggest better control. Jardiance  aids in glucose control and kidney protection. Continue Jardiance  10 mg daily, monitor blood glucose levels regularly, and maintain dietary adjustments focusing on reducing carbohydrates and increasing protein and vegetables. 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 He is undergoing physical therapy to improve balance and mobility, currently using a walker for ambulation due to balance difficulties. Home physical therapy is extended to focus on balance, particularly on the right leg and standing stability. Continue home physical therapy focusing on balance and mobility, and encourage daily exercises to improve strength and balance.  Chronic balance issues   Balance issues persist, exacerbated by recent hip fracture and replacement. He relies on a walker for stability. Physical therapy is ongoing to address balance concerns, focusing on improving stability and preventing falls. Continue physical therapy  with focus on balance improvement.  Chronic neuropathy   Chronic neuropathy contributes to balance issues.  Chronic headaches   Intermittent frontal headaches last up to a day, occurring sporadically without visual disturbances or sensitivity to light or sound. Headaches began after multiple falls. Previous head CT scans showed a scalp hematoma but no acute intracranial findings. Neurology evaluation is scheduled for August. Track headache occurrences and potential triggers, and use acetaminophen  as needed for headache relief.  Tremor   Tremor has increased, possibly due to significant weight loss. No definitive diagnosis of Parkinson's disease. Neurology appointment is scheduled for further evaluation. Tremor may be related to previous laminoplasty and weight loss. Attend neurology appointment in August for further evaluation of tremor.  Weight loss   Significant weight loss of 40 pounds primarily due to hospitalization and dietary changes post-Whipple procedure. Efforts to regain weight have been challenging. Nutritional intake is monitored, focusing on balanced meals with adequate protein and calories. Monitor weight and nutritional intake, and focus on balanced meals with adequate protein and calories.    No orders of the defined types were placed in this encounter.   I discussed the assessment and treatment plan with the patient. The patient was provided an opportunity to ask questions and all were answered. The patient agreed with the plan and demonstrated an understanding of the instructions.   The patient was advised to call back or seek an in-person evaluation if the symptoms worsen or if the condition fails to improve as anticipated.  Return in about 3 months (around 11/28/2023) for chronic follow-up.  Andrew Haas, MD [facility] [phone] (phone) [phone] (fax)  [facility]

## 2023-08-28 NOTE — Patient Instructions (Addendum)
 It was very nice to see you today!  Keep log of headaches. And check med list   PLEASE NOTE:  If you had any lab tests please let us  know if you have not heard back within a few days. You may see your results on MyChart before we have a chance to review them but we will give you a call once they are reviewed by us . If we ordered any referrals today, please let us  know if you have not heard from their office within the next week.   Please try these tips to maintain a healthy lifestyle:  Eat most of your calories during the day when you are active. Eliminate processed foods including packaged sweets (pies, cakes, cookies), reduce intake of potatoes, white bread, white pasta, and white rice. Look for whole grain options, oat flour or almond flour.  Each meal should contain half fruits/vegetables, one quarter protein, and one quarter carbs (no bigger than a computer mouse).  Cut down on sweet beverages. This includes juice, soda, and sweet tea. Also watch fruit intake, though this is a healthier sweet option, it still contains natural sugar! Limit to 3 servings daily.  Drink at least 1 glass of water  with each meal and aim for at least 8 glasses per day  Exercise at least 150 minutes every week.

## 2023-08-29 DIAGNOSIS — Z96642 Presence of left artificial hip joint: Secondary | ICD-10-CM | POA: Diagnosis not present

## 2023-08-29 DIAGNOSIS — E1136 Type 2 diabetes mellitus with diabetic cataract: Secondary | ICD-10-CM | POA: Diagnosis not present

## 2023-08-29 DIAGNOSIS — E1165 Type 2 diabetes mellitus with hyperglycemia: Secondary | ICD-10-CM | POA: Diagnosis not present

## 2023-08-29 DIAGNOSIS — S72002D Fracture of unspecified part of neck of left femur, subsequent encounter for closed fracture with routine healing: Secondary | ICD-10-CM | POA: Diagnosis not present

## 2023-08-29 DIAGNOSIS — D500 Iron deficiency anemia secondary to blood loss (chronic): Secondary | ICD-10-CM | POA: Diagnosis not present

## 2023-08-29 DIAGNOSIS — F419 Anxiety disorder, unspecified: Secondary | ICD-10-CM | POA: Diagnosis not present

## 2023-08-29 DIAGNOSIS — E440 Moderate protein-calorie malnutrition: Secondary | ICD-10-CM | POA: Diagnosis not present

## 2023-08-29 DIAGNOSIS — Z7984 Long term (current) use of oral hypoglycemic drugs: Secondary | ICD-10-CM | POA: Diagnosis not present

## 2023-08-29 DIAGNOSIS — Z87891 Personal history of nicotine dependence: Secondary | ICD-10-CM | POA: Diagnosis not present

## 2023-08-29 DIAGNOSIS — I10 Essential (primary) hypertension: Secondary | ICD-10-CM | POA: Diagnosis not present

## 2023-08-29 DIAGNOSIS — Z85828 Personal history of other malignant neoplasm of skin: Secondary | ICD-10-CM | POA: Diagnosis not present

## 2023-08-29 DIAGNOSIS — E1142 Type 2 diabetes mellitus with diabetic polyneuropathy: Secondary | ICD-10-CM | POA: Diagnosis not present

## 2023-08-29 DIAGNOSIS — F32A Depression, unspecified: Secondary | ICD-10-CM | POA: Diagnosis not present

## 2023-08-29 DIAGNOSIS — Z9181 History of falling: Secondary | ICD-10-CM | POA: Diagnosis not present

## 2023-08-29 DIAGNOSIS — C179 Malignant neoplasm of small intestine, unspecified: Secondary | ICD-10-CM | POA: Diagnosis not present

## 2023-08-29 DIAGNOSIS — R1312 Dysphagia, oropharyngeal phase: Secondary | ICD-10-CM | POA: Diagnosis not present

## 2023-08-29 DIAGNOSIS — N319 Neuromuscular dysfunction of bladder, unspecified: Secondary | ICD-10-CM | POA: Diagnosis not present

## 2023-08-29 DIAGNOSIS — C7A8 Other malignant neuroendocrine tumors: Secondary | ICD-10-CM | POA: Diagnosis not present

## 2023-08-29 DIAGNOSIS — Z7982 Long term (current) use of aspirin: Secondary | ICD-10-CM | POA: Diagnosis not present

## 2023-08-29 DIAGNOSIS — E785 Hyperlipidemia, unspecified: Secondary | ICD-10-CM | POA: Diagnosis not present

## 2023-08-29 DIAGNOSIS — Z794 Long term (current) use of insulin: Secondary | ICD-10-CM | POA: Diagnosis not present

## 2023-08-31 DIAGNOSIS — E1165 Type 2 diabetes mellitus with hyperglycemia: Secondary | ICD-10-CM | POA: Diagnosis not present

## 2023-08-31 DIAGNOSIS — E1142 Type 2 diabetes mellitus with diabetic polyneuropathy: Secondary | ICD-10-CM | POA: Diagnosis not present

## 2023-08-31 DIAGNOSIS — E1136 Type 2 diabetes mellitus with diabetic cataract: Secondary | ICD-10-CM | POA: Diagnosis not present

## 2023-08-31 DIAGNOSIS — R1312 Dysphagia, oropharyngeal phase: Secondary | ICD-10-CM | POA: Diagnosis not present

## 2023-08-31 DIAGNOSIS — C179 Malignant neoplasm of small intestine, unspecified: Secondary | ICD-10-CM | POA: Diagnosis not present

## 2023-08-31 DIAGNOSIS — D500 Iron deficiency anemia secondary to blood loss (chronic): Secondary | ICD-10-CM | POA: Diagnosis not present

## 2023-08-31 DIAGNOSIS — Z96642 Presence of left artificial hip joint: Secondary | ICD-10-CM | POA: Diagnosis not present

## 2023-08-31 DIAGNOSIS — Z7982 Long term (current) use of aspirin: Secondary | ICD-10-CM | POA: Diagnosis not present

## 2023-08-31 DIAGNOSIS — E440 Moderate protein-calorie malnutrition: Secondary | ICD-10-CM | POA: Diagnosis not present

## 2023-08-31 DIAGNOSIS — F419 Anxiety disorder, unspecified: Secondary | ICD-10-CM | POA: Diagnosis not present

## 2023-08-31 DIAGNOSIS — Z87891 Personal history of nicotine dependence: Secondary | ICD-10-CM | POA: Diagnosis not present

## 2023-08-31 DIAGNOSIS — F32A Depression, unspecified: Secondary | ICD-10-CM | POA: Diagnosis not present

## 2023-08-31 DIAGNOSIS — I10 Essential (primary) hypertension: Secondary | ICD-10-CM | POA: Diagnosis not present

## 2023-08-31 DIAGNOSIS — E785 Hyperlipidemia, unspecified: Secondary | ICD-10-CM | POA: Diagnosis not present

## 2023-08-31 DIAGNOSIS — Z794 Long term (current) use of insulin: Secondary | ICD-10-CM | POA: Diagnosis not present

## 2023-08-31 DIAGNOSIS — C7A8 Other malignant neuroendocrine tumors: Secondary | ICD-10-CM | POA: Diagnosis not present

## 2023-08-31 DIAGNOSIS — Z85828 Personal history of other malignant neoplasm of skin: Secondary | ICD-10-CM | POA: Diagnosis not present

## 2023-08-31 DIAGNOSIS — S72002D Fracture of unspecified part of neck of left femur, subsequent encounter for closed fracture with routine healing: Secondary | ICD-10-CM | POA: Diagnosis not present

## 2023-08-31 DIAGNOSIS — Z9181 History of falling: Secondary | ICD-10-CM | POA: Diagnosis not present

## 2023-08-31 DIAGNOSIS — N319 Neuromuscular dysfunction of bladder, unspecified: Secondary | ICD-10-CM | POA: Diagnosis not present

## 2023-08-31 DIAGNOSIS — Z7984 Long term (current) use of oral hypoglycemic drugs: Secondary | ICD-10-CM | POA: Diagnosis not present

## 2023-09-02 ENCOUNTER — Encounter: Payer: Self-pay | Admitting: Family Medicine

## 2023-09-05 ENCOUNTER — Other Ambulatory Visit: Payer: Self-pay | Admitting: *Deleted

## 2023-09-05 DIAGNOSIS — Z85828 Personal history of other malignant neoplasm of skin: Secondary | ICD-10-CM | POA: Diagnosis not present

## 2023-09-05 DIAGNOSIS — Z794 Long term (current) use of insulin: Secondary | ICD-10-CM | POA: Diagnosis not present

## 2023-09-05 DIAGNOSIS — E1165 Type 2 diabetes mellitus with hyperglycemia: Secondary | ICD-10-CM | POA: Diagnosis not present

## 2023-09-05 DIAGNOSIS — S72002D Fracture of unspecified part of neck of left femur, subsequent encounter for closed fracture with routine healing: Secondary | ICD-10-CM | POA: Diagnosis not present

## 2023-09-05 DIAGNOSIS — Z7982 Long term (current) use of aspirin: Secondary | ICD-10-CM | POA: Diagnosis not present

## 2023-09-05 DIAGNOSIS — F419 Anxiety disorder, unspecified: Secondary | ICD-10-CM | POA: Diagnosis not present

## 2023-09-05 DIAGNOSIS — E785 Hyperlipidemia, unspecified: Secondary | ICD-10-CM | POA: Diagnosis not present

## 2023-09-05 DIAGNOSIS — E1142 Type 2 diabetes mellitus with diabetic polyneuropathy: Secondary | ICD-10-CM | POA: Diagnosis not present

## 2023-09-05 DIAGNOSIS — S72042D Displaced fracture of base of neck of left femur, subsequent encounter for closed fracture with routine healing: Secondary | ICD-10-CM | POA: Diagnosis not present

## 2023-09-05 DIAGNOSIS — R1312 Dysphagia, oropharyngeal phase: Secondary | ICD-10-CM | POA: Diagnosis not present

## 2023-09-05 DIAGNOSIS — E1136 Type 2 diabetes mellitus with diabetic cataract: Secondary | ICD-10-CM | POA: Diagnosis not present

## 2023-09-05 DIAGNOSIS — E440 Moderate protein-calorie malnutrition: Secondary | ICD-10-CM | POA: Diagnosis not present

## 2023-09-05 DIAGNOSIS — Z7984 Long term (current) use of oral hypoglycemic drugs: Secondary | ICD-10-CM | POA: Diagnosis not present

## 2023-09-05 DIAGNOSIS — F32A Depression, unspecified: Secondary | ICD-10-CM | POA: Diagnosis not present

## 2023-09-05 DIAGNOSIS — D500 Iron deficiency anemia secondary to blood loss (chronic): Secondary | ICD-10-CM | POA: Diagnosis not present

## 2023-09-05 DIAGNOSIS — Z96642 Presence of left artificial hip joint: Secondary | ICD-10-CM | POA: Diagnosis not present

## 2023-09-05 DIAGNOSIS — C7A8 Other malignant neuroendocrine tumors: Secondary | ICD-10-CM | POA: Diagnosis not present

## 2023-09-05 DIAGNOSIS — Z87891 Personal history of nicotine dependence: Secondary | ICD-10-CM | POA: Diagnosis not present

## 2023-09-05 DIAGNOSIS — I10 Essential (primary) hypertension: Secondary | ICD-10-CM | POA: Diagnosis not present

## 2023-09-05 DIAGNOSIS — C179 Malignant neoplasm of small intestine, unspecified: Secondary | ICD-10-CM | POA: Diagnosis not present

## 2023-09-05 DIAGNOSIS — N319 Neuromuscular dysfunction of bladder, unspecified: Secondary | ICD-10-CM | POA: Diagnosis not present

## 2023-09-05 DIAGNOSIS — Z9181 History of falling: Secondary | ICD-10-CM | POA: Diagnosis not present

## 2023-09-11 DIAGNOSIS — Z7984 Long term (current) use of oral hypoglycemic drugs: Secondary | ICD-10-CM | POA: Diagnosis not present

## 2023-09-11 DIAGNOSIS — E1142 Type 2 diabetes mellitus with diabetic polyneuropathy: Secondary | ICD-10-CM | POA: Diagnosis not present

## 2023-09-11 DIAGNOSIS — D500 Iron deficiency anemia secondary to blood loss (chronic): Secondary | ICD-10-CM | POA: Diagnosis not present

## 2023-09-11 DIAGNOSIS — R1312 Dysphagia, oropharyngeal phase: Secondary | ICD-10-CM | POA: Diagnosis not present

## 2023-09-11 DIAGNOSIS — Z85828 Personal history of other malignant neoplasm of skin: Secondary | ICD-10-CM | POA: Diagnosis not present

## 2023-09-11 DIAGNOSIS — Z794 Long term (current) use of insulin: Secondary | ICD-10-CM | POA: Diagnosis not present

## 2023-09-11 DIAGNOSIS — I10 Essential (primary) hypertension: Secondary | ICD-10-CM | POA: Diagnosis not present

## 2023-09-11 DIAGNOSIS — Z7982 Long term (current) use of aspirin: Secondary | ICD-10-CM | POA: Diagnosis not present

## 2023-09-11 DIAGNOSIS — N319 Neuromuscular dysfunction of bladder, unspecified: Secondary | ICD-10-CM | POA: Diagnosis not present

## 2023-09-11 DIAGNOSIS — E785 Hyperlipidemia, unspecified: Secondary | ICD-10-CM | POA: Diagnosis not present

## 2023-09-11 DIAGNOSIS — C7A8 Other malignant neuroendocrine tumors: Secondary | ICD-10-CM | POA: Diagnosis not present

## 2023-09-11 DIAGNOSIS — F419 Anxiety disorder, unspecified: Secondary | ICD-10-CM | POA: Diagnosis not present

## 2023-09-11 DIAGNOSIS — Z96642 Presence of left artificial hip joint: Secondary | ICD-10-CM | POA: Diagnosis not present

## 2023-09-11 DIAGNOSIS — E440 Moderate protein-calorie malnutrition: Secondary | ICD-10-CM | POA: Diagnosis not present

## 2023-09-11 DIAGNOSIS — Z9181 History of falling: Secondary | ICD-10-CM | POA: Diagnosis not present

## 2023-09-11 DIAGNOSIS — E1165 Type 2 diabetes mellitus with hyperglycemia: Secondary | ICD-10-CM | POA: Diagnosis not present

## 2023-09-11 DIAGNOSIS — S72002D Fracture of unspecified part of neck of left femur, subsequent encounter for closed fracture with routine healing: Secondary | ICD-10-CM | POA: Diagnosis not present

## 2023-09-11 DIAGNOSIS — F32A Depression, unspecified: Secondary | ICD-10-CM | POA: Diagnosis not present

## 2023-09-11 DIAGNOSIS — E1136 Type 2 diabetes mellitus with diabetic cataract: Secondary | ICD-10-CM | POA: Diagnosis not present

## 2023-09-11 DIAGNOSIS — C179 Malignant neoplasm of small intestine, unspecified: Secondary | ICD-10-CM | POA: Diagnosis not present

## 2023-09-11 DIAGNOSIS — Z87891 Personal history of nicotine dependence: Secondary | ICD-10-CM | POA: Diagnosis not present

## 2023-09-12 DIAGNOSIS — Z87891 Personal history of nicotine dependence: Secondary | ICD-10-CM | POA: Diagnosis not present

## 2023-09-12 DIAGNOSIS — C179 Malignant neoplasm of small intestine, unspecified: Secondary | ICD-10-CM | POA: Diagnosis not present

## 2023-09-12 DIAGNOSIS — I10 Essential (primary) hypertension: Secondary | ICD-10-CM | POA: Diagnosis not present

## 2023-09-12 DIAGNOSIS — Z9181 History of falling: Secondary | ICD-10-CM | POA: Diagnosis not present

## 2023-09-12 DIAGNOSIS — E785 Hyperlipidemia, unspecified: Secondary | ICD-10-CM | POA: Diagnosis not present

## 2023-09-12 DIAGNOSIS — R1312 Dysphagia, oropharyngeal phase: Secondary | ICD-10-CM | POA: Diagnosis not present

## 2023-09-12 DIAGNOSIS — Z794 Long term (current) use of insulin: Secondary | ICD-10-CM | POA: Diagnosis not present

## 2023-09-12 DIAGNOSIS — Z7982 Long term (current) use of aspirin: Secondary | ICD-10-CM | POA: Diagnosis not present

## 2023-09-12 DIAGNOSIS — E1165 Type 2 diabetes mellitus with hyperglycemia: Secondary | ICD-10-CM | POA: Diagnosis not present

## 2023-09-12 DIAGNOSIS — C7A8 Other malignant neuroendocrine tumors: Secondary | ICD-10-CM | POA: Diagnosis not present

## 2023-09-12 DIAGNOSIS — F419 Anxiety disorder, unspecified: Secondary | ICD-10-CM | POA: Diagnosis not present

## 2023-09-12 DIAGNOSIS — N319 Neuromuscular dysfunction of bladder, unspecified: Secondary | ICD-10-CM | POA: Diagnosis not present

## 2023-09-12 DIAGNOSIS — F32A Depression, unspecified: Secondary | ICD-10-CM | POA: Diagnosis not present

## 2023-09-12 DIAGNOSIS — E440 Moderate protein-calorie malnutrition: Secondary | ICD-10-CM | POA: Diagnosis not present

## 2023-09-12 DIAGNOSIS — Z7984 Long term (current) use of oral hypoglycemic drugs: Secondary | ICD-10-CM | POA: Diagnosis not present

## 2023-09-12 DIAGNOSIS — E1136 Type 2 diabetes mellitus with diabetic cataract: Secondary | ICD-10-CM | POA: Diagnosis not present

## 2023-09-12 DIAGNOSIS — Z85828 Personal history of other malignant neoplasm of skin: Secondary | ICD-10-CM | POA: Diagnosis not present

## 2023-09-12 DIAGNOSIS — Z96642 Presence of left artificial hip joint: Secondary | ICD-10-CM | POA: Diagnosis not present

## 2023-09-12 DIAGNOSIS — S72002D Fracture of unspecified part of neck of left femur, subsequent encounter for closed fracture with routine healing: Secondary | ICD-10-CM | POA: Diagnosis not present

## 2023-09-12 DIAGNOSIS — D500 Iron deficiency anemia secondary to blood loss (chronic): Secondary | ICD-10-CM | POA: Diagnosis not present

## 2023-09-12 DIAGNOSIS — E1142 Type 2 diabetes mellitus with diabetic polyneuropathy: Secondary | ICD-10-CM | POA: Diagnosis not present

## 2023-09-14 ENCOUNTER — Other Ambulatory Visit: Payer: Self-pay

## 2023-09-14 ENCOUNTER — Telehealth: Payer: Self-pay | Admitting: Family Medicine

## 2023-09-14 DIAGNOSIS — E1165 Type 2 diabetes mellitus with hyperglycemia: Secondary | ICD-10-CM

## 2023-09-14 MED ORDER — DEXCOM G7 SENSOR MISC
3 refills | Status: AC
Start: 2023-09-14 — End: ?

## 2023-09-14 NOTE — Telephone Encounter (Signed)
 [facility] faxed document Home Health Certificate (Order [place] [id number]), to be filled out by provider. Patient requested to send it back via Fax within ASAP. Document is located in providers tray at front office.Please advise at [phone].

## 2023-09-15 DIAGNOSIS — N319 Neuromuscular dysfunction of bladder, unspecified: Secondary | ICD-10-CM | POA: Diagnosis not present

## 2023-09-15 DIAGNOSIS — D500 Iron deficiency anemia secondary to blood loss (chronic): Secondary | ICD-10-CM | POA: Diagnosis not present

## 2023-09-15 DIAGNOSIS — Z7984 Long term (current) use of oral hypoglycemic drugs: Secondary | ICD-10-CM | POA: Diagnosis not present

## 2023-09-15 DIAGNOSIS — E440 Moderate protein-calorie malnutrition: Secondary | ICD-10-CM | POA: Diagnosis not present

## 2023-09-15 DIAGNOSIS — E1142 Type 2 diabetes mellitus with diabetic polyneuropathy: Secondary | ICD-10-CM | POA: Diagnosis not present

## 2023-09-15 DIAGNOSIS — Z87891 Personal history of nicotine dependence: Secondary | ICD-10-CM | POA: Diagnosis not present

## 2023-09-15 DIAGNOSIS — C179 Malignant neoplasm of small intestine, unspecified: Secondary | ICD-10-CM | POA: Diagnosis not present

## 2023-09-15 DIAGNOSIS — Z7982 Long term (current) use of aspirin: Secondary | ICD-10-CM | POA: Diagnosis not present

## 2023-09-15 DIAGNOSIS — C7A8 Other malignant neuroendocrine tumors: Secondary | ICD-10-CM | POA: Diagnosis not present

## 2023-09-15 DIAGNOSIS — E785 Hyperlipidemia, unspecified: Secondary | ICD-10-CM | POA: Diagnosis not present

## 2023-09-15 DIAGNOSIS — F32A Depression, unspecified: Secondary | ICD-10-CM | POA: Diagnosis not present

## 2023-09-15 DIAGNOSIS — Z85828 Personal history of other malignant neoplasm of skin: Secondary | ICD-10-CM | POA: Diagnosis not present

## 2023-09-15 DIAGNOSIS — F419 Anxiety disorder, unspecified: Secondary | ICD-10-CM | POA: Diagnosis not present

## 2023-09-15 DIAGNOSIS — Z794 Long term (current) use of insulin: Secondary | ICD-10-CM | POA: Diagnosis not present

## 2023-09-15 DIAGNOSIS — S72002D Fracture of unspecified part of neck of left femur, subsequent encounter for closed fracture with routine healing: Secondary | ICD-10-CM | POA: Diagnosis not present

## 2023-09-15 DIAGNOSIS — R1312 Dysphagia, oropharyngeal phase: Secondary | ICD-10-CM | POA: Diagnosis not present

## 2023-09-15 DIAGNOSIS — E1165 Type 2 diabetes mellitus with hyperglycemia: Secondary | ICD-10-CM | POA: Diagnosis not present

## 2023-09-15 DIAGNOSIS — I10 Essential (primary) hypertension: Secondary | ICD-10-CM | POA: Diagnosis not present

## 2023-09-15 DIAGNOSIS — Z96642 Presence of left artificial hip joint: Secondary | ICD-10-CM | POA: Diagnosis not present

## 2023-09-15 DIAGNOSIS — Z9181 History of falling: Secondary | ICD-10-CM | POA: Diagnosis not present

## 2023-09-15 DIAGNOSIS — E1136 Type 2 diabetes mellitus with diabetic cataract: Secondary | ICD-10-CM | POA: Diagnosis not present

## 2023-09-15 NOTE — Telephone Encounter (Signed)
 Formed placed on provider's desk.

## 2023-09-19 DIAGNOSIS — Z85828 Personal history of other malignant neoplasm of skin: Secondary | ICD-10-CM | POA: Diagnosis not present

## 2023-09-19 DIAGNOSIS — E1136 Type 2 diabetes mellitus with diabetic cataract: Secondary | ICD-10-CM | POA: Diagnosis not present

## 2023-09-19 DIAGNOSIS — R1312 Dysphagia, oropharyngeal phase: Secondary | ICD-10-CM | POA: Diagnosis not present

## 2023-09-19 DIAGNOSIS — E785 Hyperlipidemia, unspecified: Secondary | ICD-10-CM | POA: Diagnosis not present

## 2023-09-19 DIAGNOSIS — Z7984 Long term (current) use of oral hypoglycemic drugs: Secondary | ICD-10-CM | POA: Diagnosis not present

## 2023-09-19 DIAGNOSIS — S72002D Fracture of unspecified part of neck of left femur, subsequent encounter for closed fracture with routine healing: Secondary | ICD-10-CM | POA: Diagnosis not present

## 2023-09-19 DIAGNOSIS — Z9181 History of falling: Secondary | ICD-10-CM | POA: Diagnosis not present

## 2023-09-19 DIAGNOSIS — E1165 Type 2 diabetes mellitus with hyperglycemia: Secondary | ICD-10-CM | POA: Diagnosis not present

## 2023-09-19 DIAGNOSIS — E1142 Type 2 diabetes mellitus with diabetic polyneuropathy: Secondary | ICD-10-CM | POA: Diagnosis not present

## 2023-09-19 DIAGNOSIS — I10 Essential (primary) hypertension: Secondary | ICD-10-CM | POA: Diagnosis not present

## 2023-09-19 DIAGNOSIS — E1140 Type 2 diabetes mellitus with diabetic neuropathy, unspecified: Secondary | ICD-10-CM | POA: Diagnosis not present

## 2023-09-19 DIAGNOSIS — C179 Malignant neoplasm of small intestine, unspecified: Secondary | ICD-10-CM | POA: Diagnosis not present

## 2023-09-19 DIAGNOSIS — Z87891 Personal history of nicotine dependence: Secondary | ICD-10-CM | POA: Diagnosis not present

## 2023-09-19 DIAGNOSIS — S72002A Fracture of unspecified part of neck of left femur, initial encounter for closed fracture: Secondary | ICD-10-CM | POA: Diagnosis not present

## 2023-09-19 DIAGNOSIS — N319 Neuromuscular dysfunction of bladder, unspecified: Secondary | ICD-10-CM | POA: Diagnosis not present

## 2023-09-19 DIAGNOSIS — F419 Anxiety disorder, unspecified: Secondary | ICD-10-CM | POA: Diagnosis not present

## 2023-09-19 DIAGNOSIS — G709 Myoneural disorder, unspecified: Secondary | ICD-10-CM | POA: Diagnosis not present

## 2023-09-19 DIAGNOSIS — C7A8 Other malignant neuroendocrine tumors: Secondary | ICD-10-CM | POA: Diagnosis not present

## 2023-09-19 DIAGNOSIS — Z7982 Long term (current) use of aspirin: Secondary | ICD-10-CM | POA: Diagnosis not present

## 2023-09-19 DIAGNOSIS — F32A Depression, unspecified: Secondary | ICD-10-CM | POA: Diagnosis not present

## 2023-09-19 DIAGNOSIS — Z794 Long term (current) use of insulin: Secondary | ICD-10-CM | POA: Diagnosis not present

## 2023-09-19 DIAGNOSIS — E440 Moderate protein-calorie malnutrition: Secondary | ICD-10-CM | POA: Diagnosis not present

## 2023-09-19 DIAGNOSIS — D500 Iron deficiency anemia secondary to blood loss (chronic): Secondary | ICD-10-CM | POA: Diagnosis not present

## 2023-09-19 DIAGNOSIS — Z96642 Presence of left artificial hip joint: Secondary | ICD-10-CM | POA: Diagnosis not present

## 2023-09-21 ENCOUNTER — Other Ambulatory Visit (HOSPITAL_BASED_OUTPATIENT_CLINIC_OR_DEPARTMENT_OTHER): Payer: Self-pay

## 2023-09-21 ENCOUNTER — Ambulatory Visit: Admitting: Dermatology

## 2023-09-21 ENCOUNTER — Encounter: Payer: Self-pay | Admitting: Dermatology

## 2023-09-21 VITALS — BP 105/74

## 2023-09-21 DIAGNOSIS — R634 Abnormal weight loss: Secondary | ICD-10-CM | POA: Diagnosis not present

## 2023-09-21 DIAGNOSIS — Z9889 Other specified postprocedural states: Secondary | ICD-10-CM

## 2023-09-21 DIAGNOSIS — L259 Unspecified contact dermatitis, unspecified cause: Secondary | ICD-10-CM

## 2023-09-21 DIAGNOSIS — L309 Dermatitis, unspecified: Secondary | ICD-10-CM

## 2023-09-21 DIAGNOSIS — L219 Seborrheic dermatitis, unspecified: Secondary | ICD-10-CM

## 2023-09-21 MED ORDER — NYSTATIN-TRIAMCINOLONE 100000-0.1 UNIT/GM-% EX OINT: 1.0000 | TOPICAL_OINTMENT | Freq: Two times a day (BID) | CUTANEOUS | 0 refills | Status: DC

## 2023-09-21 MED ORDER — FLUCONAZOLE 150 MG PO TABS: 150.0000 mg | ORAL_TABLET | ORAL | 0 refills | Status: DC

## 2023-09-21 NOTE — Patient Instructions (Addendum)
 Hello [place],  Thank you for visiting today. Here is a summary of the key instructions:  Medications: - Take one dose of oral fluconazole antifungal today and another in a week - Use nystatin  triamcinolone  ointment for up to 2 weeks, then take a break   - Keep this ointment for future flares  Skin Care: - Switch to zinc -based shampoos like DHS zinc  for scalp, eyebrows, face, and beard   - Find at [employer] or [employer] - For shaving:   - Use Aveeno shaving gel   - Use a warm towel to soften skin  Shower Safety: - Use a detachable showerhead - Use a shower chair - Ensure thorough rinsing  Exercise: - Continue exercises to regain muscle and weight - Keep weights next to the couch for easy access   - Schedule a follow-up appointment in November  We look forward to seeing you at your next visit. If you have any questions or concerns before then, please do not hesitate to contact our office.  Warm regards,  Dr. Louana Roup, Dermatology          Important Information  Due to recent changes in healthcare laws, you may see results of your pathology and/or laboratory studies on MyChart before the doctors have had a chance to review them. We understand that in some cases there may be results that are confusing or concerning to you. Please understand that not all results are received at the same time and often the doctors may need to interpret multiple results in order to provide you with the best plan of care or course of treatment. Therefore, we ask that you please give us  2 business days to thoroughly review all your results before contacting the office for clarification. Should we see a critical lab result, you will be contacted sooner.   If You Need Anything After Your Visit  If you have any questions or concerns for your doctor, please call our main line at [phone] If no one answers, please leave a voicemail as directed and we will return your call as soon  as possible. Messages left after 4 pm will be answered the following business day.   You may also send us  a message via MyChart. We typically respond to MyChart messages within 1-2 business days.  For prescription refills, please ask your pharmacy to contact our office. Our fax number is [phone].  If you have an urgent issue when the clinic is closed that cannot wait until the next business day, you can page your doctor at the number below.    Please note that while we do our best to be available for urgent issues outside of office hours, we are not available 24/7.   If you have an urgent issue and are unable to reach us , you may choose to seek medical care at your doctor's office, retail clinic, urgent care center, or emergency room.  If you have a medical emergency, please immediately call 911 or go to the emergency department. In the event of inclement weather, please call our main line at [phone] for an update on the status of any delays or closures.  Dermatology Medication Tips: Please keep the boxes that topical medications come in in order to help keep track of the instructions about where and how to use these. Pharmacies typically print the medication instructions only on the boxes and not directly on the medication tubes.   If your medication is too expensive, please contact our office at [phone]  or send us  a message through MyChart.   We are unable to tell what your co-pay for medications will be in advance as this is different depending on your insurance coverage. However, we may be able to find a substitute medication at lower cost or fill out paperwork to get insurance to cover a needed medication.   If a prior authorization is required to get your medication covered by your insurance company, please allow us  1-2 business days to complete this process.  Drug prices often vary depending on where the prescription is filled and some pharmacies may offer cheaper  prices.  The website www.goodrx.com contains coupons for medications through different pharmacies. The prices here do not account for what the cost may be with help from insurance (it may be cheaper with your insurance), but the website can give you the price if you did not use any insurance.  - You can print the associated coupon and take it with your prescription to the pharmacy.  - You may also stop by our office during regular business hours and pick up a GoodRx coupon card.  - If you need your prescription sent electronically to a different pharmacy, notify our office through [facility] or by phone at [phone]

## 2023-09-21 NOTE — Progress Notes (Signed)
   Follow-Up Visit   Subjective  Andrew Tanner is a 73 y.o. male who presents for the following: Red blotches of scalp and forehead. He is recovering from a Whipple surgery and hip replacement in facility.  He put ketoconazole  shampoo on his scalp and tried to wash it off as best he could. He is not able to stand in a shower. 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 His wife says his head looked like he had broken out in measles.   Accompanied by wife today  The following portions of the chart were reviewed this encounter and updated as appropriate: medications, allergies, medical history  Review of Systems:  No other skin or systemic complaints except as noted in HPI or Assessment and Plan.  Objective  Well appearing patient in no apparent distress; mood and affect are within normal limits.   A focused examination was performed of the following areas: Scalp, face   Relevant exam findings are noted in the Assessment and Plan.          Assessment & Plan    1. Severe Seborrheic Dermatitis Flare - Assessment:  Patient developed severe seborrheic dermatitis flare with erythema on the nasolabial folds, beard area, glabella, and eyebrows, along with thick yellow flakes. Condition appears exacerbated by ketoconazole  shampoo use. Recent surgeries, significant weight loss, and unstable diabetic numbers may be contributing factors.  - Plan:    Prescribe oral fluconazole: one dose today and another in a week    Transition to zinc -based shampoos (e.g., DHS zinc ) for scalp, eyebrows, face, and beard    Apply nystatin -triamcinolone  ointment for up to 2 weeks, then take a break    For shaving:     - Use Aveeno shaving gel     - Apply warm towel to soften skin before shaving   2. Contact Dermatitis of the Scalp - Assessment: Erythematous itchy papules and excoriations on the scalp, consistent with contact dermatitis. Likely related to ketoconazole  shampoo use.  - Plan:    Discontinue  ketoconazole  shampoo    Transition to zinc -based shampoos (e.g., DHS zinc ) for scalp    Apply nystatin  triamcinolone  ointment for up to 2 weeks, then take a break    In shower:     - Use detachable showerhead and shower chair to ensure thorough rinsing  3. Post-surgical Recovery and Weight Loss - Assessment:  Patient underwent Whipple procedure in January and total hip replacement on April 2nd. Experienced significant weight loss of 40 pounds during hospitalization, impacting overall health and diabetic control.  - Plan:    Continue exercises to regain muscle and weight    Keep weights next to the couch for convenience    Monitor diabetic numbers closely due to stress and body chemistry changes  Follow-up appointment scheduled for November to reassess and ensure resolution of skin conditions.    Return in about 5 months (around 02/21/2024).  I, Eliot Guernsey, CMA, am acting as scribe for [employer], DO .   Documentation: I have reviewed the above documentation for accuracy and completeness, and I agree with the above.  Louana Roup, DO

## 2023-09-22 DIAGNOSIS — F419 Anxiety disorder, unspecified: Secondary | ICD-10-CM | POA: Diagnosis not present

## 2023-09-22 DIAGNOSIS — Z7982 Long term (current) use of aspirin: Secondary | ICD-10-CM | POA: Diagnosis not present

## 2023-09-22 DIAGNOSIS — E1142 Type 2 diabetes mellitus with diabetic polyneuropathy: Secondary | ICD-10-CM | POA: Diagnosis not present

## 2023-09-22 DIAGNOSIS — Z87891 Personal history of nicotine dependence: Secondary | ICD-10-CM | POA: Diagnosis not present

## 2023-09-22 DIAGNOSIS — F32A Depression, unspecified: Secondary | ICD-10-CM | POA: Diagnosis not present

## 2023-09-22 DIAGNOSIS — C7A8 Other malignant neuroendocrine tumors: Secondary | ICD-10-CM | POA: Diagnosis not present

## 2023-09-22 DIAGNOSIS — Z85828 Personal history of other malignant neoplasm of skin: Secondary | ICD-10-CM | POA: Diagnosis not present

## 2023-09-22 DIAGNOSIS — Z794 Long term (current) use of insulin: Secondary | ICD-10-CM | POA: Diagnosis not present

## 2023-09-22 DIAGNOSIS — D500 Iron deficiency anemia secondary to blood loss (chronic): Secondary | ICD-10-CM | POA: Diagnosis not present

## 2023-09-22 DIAGNOSIS — E440 Moderate protein-calorie malnutrition: Secondary | ICD-10-CM | POA: Diagnosis not present

## 2023-09-22 DIAGNOSIS — Z7984 Long term (current) use of oral hypoglycemic drugs: Secondary | ICD-10-CM | POA: Diagnosis not present

## 2023-09-22 DIAGNOSIS — E785 Hyperlipidemia, unspecified: Secondary | ICD-10-CM | POA: Diagnosis not present

## 2023-09-22 DIAGNOSIS — C179 Malignant neoplasm of small intestine, unspecified: Secondary | ICD-10-CM | POA: Diagnosis not present

## 2023-09-22 DIAGNOSIS — E1136 Type 2 diabetes mellitus with diabetic cataract: Secondary | ICD-10-CM | POA: Diagnosis not present

## 2023-09-22 DIAGNOSIS — I10 Essential (primary) hypertension: Secondary | ICD-10-CM | POA: Diagnosis not present

## 2023-09-22 DIAGNOSIS — Z96642 Presence of left artificial hip joint: Secondary | ICD-10-CM | POA: Diagnosis not present

## 2023-09-22 DIAGNOSIS — S72002D Fracture of unspecified part of neck of left femur, subsequent encounter for closed fracture with routine healing: Secondary | ICD-10-CM | POA: Diagnosis not present

## 2023-09-22 DIAGNOSIS — E1165 Type 2 diabetes mellitus with hyperglycemia: Secondary | ICD-10-CM | POA: Diagnosis not present

## 2023-09-22 DIAGNOSIS — N319 Neuromuscular dysfunction of bladder, unspecified: Secondary | ICD-10-CM | POA: Diagnosis not present

## 2023-09-22 DIAGNOSIS — Z9181 History of falling: Secondary | ICD-10-CM | POA: Diagnosis not present

## 2023-09-22 DIAGNOSIS — R1312 Dysphagia, oropharyngeal phase: Secondary | ICD-10-CM | POA: Diagnosis not present

## 2023-09-23 DIAGNOSIS — E1142 Type 2 diabetes mellitus with diabetic polyneuropathy: Secondary | ICD-10-CM | POA: Diagnosis not present

## 2023-09-23 DIAGNOSIS — R1312 Dysphagia, oropharyngeal phase: Secondary | ICD-10-CM | POA: Diagnosis not present

## 2023-09-23 DIAGNOSIS — Z96642 Presence of left artificial hip joint: Secondary | ICD-10-CM | POA: Diagnosis not present

## 2023-09-23 DIAGNOSIS — Z7982 Long term (current) use of aspirin: Secondary | ICD-10-CM | POA: Diagnosis not present

## 2023-09-23 DIAGNOSIS — E440 Moderate protein-calorie malnutrition: Secondary | ICD-10-CM | POA: Diagnosis not present

## 2023-09-23 DIAGNOSIS — S72002D Fracture of unspecified part of neck of left femur, subsequent encounter for closed fracture with routine healing: Secondary | ICD-10-CM | POA: Diagnosis not present

## 2023-09-23 DIAGNOSIS — I10 Essential (primary) hypertension: Secondary | ICD-10-CM | POA: Diagnosis not present

## 2023-09-23 DIAGNOSIS — D500 Iron deficiency anemia secondary to blood loss (chronic): Secondary | ICD-10-CM | POA: Diagnosis not present

## 2023-09-23 DIAGNOSIS — Z7984 Long term (current) use of oral hypoglycemic drugs: Secondary | ICD-10-CM | POA: Diagnosis not present

## 2023-09-23 DIAGNOSIS — N319 Neuromuscular dysfunction of bladder, unspecified: Secondary | ICD-10-CM | POA: Diagnosis not present

## 2023-09-23 DIAGNOSIS — E1165 Type 2 diabetes mellitus with hyperglycemia: Secondary | ICD-10-CM | POA: Diagnosis not present

## 2023-09-23 DIAGNOSIS — E785 Hyperlipidemia, unspecified: Secondary | ICD-10-CM | POA: Diagnosis not present

## 2023-09-23 DIAGNOSIS — F32A Depression, unspecified: Secondary | ICD-10-CM | POA: Diagnosis not present

## 2023-09-23 DIAGNOSIS — F419 Anxiety disorder, unspecified: Secondary | ICD-10-CM | POA: Diagnosis not present

## 2023-09-23 DIAGNOSIS — Z794 Long term (current) use of insulin: Secondary | ICD-10-CM | POA: Diagnosis not present

## 2023-09-23 DIAGNOSIS — Z85828 Personal history of other malignant neoplasm of skin: Secondary | ICD-10-CM | POA: Diagnosis not present

## 2023-09-23 DIAGNOSIS — C7A8 Other malignant neuroendocrine tumors: Secondary | ICD-10-CM | POA: Diagnosis not present

## 2023-09-23 DIAGNOSIS — Z87891 Personal history of nicotine dependence: Secondary | ICD-10-CM | POA: Diagnosis not present

## 2023-09-23 DIAGNOSIS — C179 Malignant neoplasm of small intestine, unspecified: Secondary | ICD-10-CM | POA: Diagnosis not present

## 2023-09-23 DIAGNOSIS — E1136 Type 2 diabetes mellitus with diabetic cataract: Secondary | ICD-10-CM | POA: Diagnosis not present

## 2023-09-23 DIAGNOSIS — Z9181 History of falling: Secondary | ICD-10-CM | POA: Diagnosis not present

## 2023-09-25 DIAGNOSIS — F419 Anxiety disorder, unspecified: Secondary | ICD-10-CM | POA: Diagnosis not present

## 2023-09-25 DIAGNOSIS — D500 Iron deficiency anemia secondary to blood loss (chronic): Secondary | ICD-10-CM | POA: Diagnosis not present

## 2023-09-25 DIAGNOSIS — E1165 Type 2 diabetes mellitus with hyperglycemia: Secondary | ICD-10-CM | POA: Diagnosis not present

## 2023-09-25 DIAGNOSIS — Z85828 Personal history of other malignant neoplasm of skin: Secondary | ICD-10-CM | POA: Diagnosis not present

## 2023-09-25 DIAGNOSIS — E1142 Type 2 diabetes mellitus with diabetic polyneuropathy: Secondary | ICD-10-CM | POA: Diagnosis not present

## 2023-09-25 DIAGNOSIS — E440 Moderate protein-calorie malnutrition: Secondary | ICD-10-CM | POA: Diagnosis not present

## 2023-09-25 DIAGNOSIS — R1312 Dysphagia, oropharyngeal phase: Secondary | ICD-10-CM | POA: Diagnosis not present

## 2023-09-25 DIAGNOSIS — Z87891 Personal history of nicotine dependence: Secondary | ICD-10-CM | POA: Diagnosis not present

## 2023-09-25 DIAGNOSIS — Z7982 Long term (current) use of aspirin: Secondary | ICD-10-CM | POA: Diagnosis not present

## 2023-09-25 DIAGNOSIS — I10 Essential (primary) hypertension: Secondary | ICD-10-CM | POA: Diagnosis not present

## 2023-09-25 DIAGNOSIS — S72002D Fracture of unspecified part of neck of left femur, subsequent encounter for closed fracture with routine healing: Secondary | ICD-10-CM | POA: Diagnosis not present

## 2023-09-25 DIAGNOSIS — Z794 Long term (current) use of insulin: Secondary | ICD-10-CM | POA: Diagnosis not present

## 2023-09-25 DIAGNOSIS — F32A Depression, unspecified: Secondary | ICD-10-CM | POA: Diagnosis not present

## 2023-09-25 DIAGNOSIS — Z96642 Presence of left artificial hip joint: Secondary | ICD-10-CM | POA: Diagnosis not present

## 2023-09-25 DIAGNOSIS — N319 Neuromuscular dysfunction of bladder, unspecified: Secondary | ICD-10-CM | POA: Diagnosis not present

## 2023-09-25 DIAGNOSIS — C179 Malignant neoplasm of small intestine, unspecified: Secondary | ICD-10-CM | POA: Diagnosis not present

## 2023-09-25 DIAGNOSIS — C7A8 Other malignant neuroendocrine tumors: Secondary | ICD-10-CM | POA: Diagnosis not present

## 2023-09-25 DIAGNOSIS — E785 Hyperlipidemia, unspecified: Secondary | ICD-10-CM | POA: Diagnosis not present

## 2023-09-25 DIAGNOSIS — Z9181 History of falling: Secondary | ICD-10-CM | POA: Diagnosis not present

## 2023-09-25 DIAGNOSIS — Z7984 Long term (current) use of oral hypoglycemic drugs: Secondary | ICD-10-CM | POA: Diagnosis not present

## 2023-09-25 DIAGNOSIS — E1136 Type 2 diabetes mellitus with diabetic cataract: Secondary | ICD-10-CM | POA: Diagnosis not present

## 2023-09-26 ENCOUNTER — Ambulatory Visit: Admitting: Cardiovascular Disease

## 2023-09-26 DIAGNOSIS — Z7984 Long term (current) use of oral hypoglycemic drugs: Secondary | ICD-10-CM | POA: Diagnosis not present

## 2023-09-26 DIAGNOSIS — E1136 Type 2 diabetes mellitus with diabetic cataract: Secondary | ICD-10-CM | POA: Diagnosis not present

## 2023-09-26 DIAGNOSIS — S72002D Fracture of unspecified part of neck of left femur, subsequent encounter for closed fracture with routine healing: Secondary | ICD-10-CM | POA: Diagnosis not present

## 2023-09-26 DIAGNOSIS — R1312 Dysphagia, oropharyngeal phase: Secondary | ICD-10-CM | POA: Diagnosis not present

## 2023-09-26 DIAGNOSIS — D500 Iron deficiency anemia secondary to blood loss (chronic): Secondary | ICD-10-CM | POA: Diagnosis not present

## 2023-09-26 DIAGNOSIS — F32A Depression, unspecified: Secondary | ICD-10-CM | POA: Diagnosis not present

## 2023-09-26 DIAGNOSIS — Z794 Long term (current) use of insulin: Secondary | ICD-10-CM | POA: Diagnosis not present

## 2023-09-26 DIAGNOSIS — Z9181 History of falling: Secondary | ICD-10-CM | POA: Diagnosis not present

## 2023-09-26 DIAGNOSIS — E1142 Type 2 diabetes mellitus with diabetic polyneuropathy: Secondary | ICD-10-CM | POA: Diagnosis not present

## 2023-09-26 DIAGNOSIS — Z96642 Presence of left artificial hip joint: Secondary | ICD-10-CM | POA: Diagnosis not present

## 2023-09-26 DIAGNOSIS — C179 Malignant neoplasm of small intestine, unspecified: Secondary | ICD-10-CM | POA: Diagnosis not present

## 2023-09-26 DIAGNOSIS — I10 Essential (primary) hypertension: Secondary | ICD-10-CM | POA: Diagnosis not present

## 2023-09-26 DIAGNOSIS — E440 Moderate protein-calorie malnutrition: Secondary | ICD-10-CM | POA: Diagnosis not present

## 2023-09-26 DIAGNOSIS — E1165 Type 2 diabetes mellitus with hyperglycemia: Secondary | ICD-10-CM | POA: Diagnosis not present

## 2023-09-26 DIAGNOSIS — Z7982 Long term (current) use of aspirin: Secondary | ICD-10-CM | POA: Diagnosis not present

## 2023-09-26 DIAGNOSIS — Z87891 Personal history of nicotine dependence: Secondary | ICD-10-CM | POA: Diagnosis not present

## 2023-09-26 DIAGNOSIS — N319 Neuromuscular dysfunction of bladder, unspecified: Secondary | ICD-10-CM | POA: Diagnosis not present

## 2023-09-26 DIAGNOSIS — F419 Anxiety disorder, unspecified: Secondary | ICD-10-CM | POA: Diagnosis not present

## 2023-09-26 DIAGNOSIS — C7A8 Other malignant neuroendocrine tumors: Secondary | ICD-10-CM | POA: Diagnosis not present

## 2023-09-26 DIAGNOSIS — E785 Hyperlipidemia, unspecified: Secondary | ICD-10-CM | POA: Diagnosis not present

## 2023-09-26 DIAGNOSIS — Z85828 Personal history of other malignant neoplasm of skin: Secondary | ICD-10-CM | POA: Diagnosis not present

## 2023-09-27 DIAGNOSIS — C7A8 Other malignant neuroendocrine tumors: Secondary | ICD-10-CM | POA: Diagnosis not present

## 2023-09-27 DIAGNOSIS — S72002D Fracture of unspecified part of neck of left femur, subsequent encounter for closed fracture with routine healing: Secondary | ICD-10-CM | POA: Diagnosis not present

## 2023-09-27 DIAGNOSIS — E440 Moderate protein-calorie malnutrition: Secondary | ICD-10-CM | POA: Diagnosis not present

## 2023-09-27 DIAGNOSIS — R1312 Dysphagia, oropharyngeal phase: Secondary | ICD-10-CM | POA: Diagnosis not present

## 2023-09-27 DIAGNOSIS — F32A Depression, unspecified: Secondary | ICD-10-CM | POA: Diagnosis not present

## 2023-09-27 DIAGNOSIS — Z87891 Personal history of nicotine dependence: Secondary | ICD-10-CM | POA: Diagnosis not present

## 2023-09-27 DIAGNOSIS — N319 Neuromuscular dysfunction of bladder, unspecified: Secondary | ICD-10-CM | POA: Diagnosis not present

## 2023-09-27 DIAGNOSIS — E1136 Type 2 diabetes mellitus with diabetic cataract: Secondary | ICD-10-CM | POA: Diagnosis not present

## 2023-09-27 DIAGNOSIS — Z794 Long term (current) use of insulin: Secondary | ICD-10-CM | POA: Diagnosis not present

## 2023-09-27 DIAGNOSIS — Z7982 Long term (current) use of aspirin: Secondary | ICD-10-CM | POA: Diagnosis not present

## 2023-09-27 DIAGNOSIS — I10 Essential (primary) hypertension: Secondary | ICD-10-CM | POA: Diagnosis not present

## 2023-09-27 DIAGNOSIS — E1165 Type 2 diabetes mellitus with hyperglycemia: Secondary | ICD-10-CM | POA: Diagnosis not present

## 2023-09-27 DIAGNOSIS — C179 Malignant neoplasm of small intestine, unspecified: Secondary | ICD-10-CM | POA: Diagnosis not present

## 2023-09-27 DIAGNOSIS — Z85828 Personal history of other malignant neoplasm of skin: Secondary | ICD-10-CM | POA: Diagnosis not present

## 2023-09-27 DIAGNOSIS — E785 Hyperlipidemia, unspecified: Secondary | ICD-10-CM | POA: Diagnosis not present

## 2023-09-27 DIAGNOSIS — Z96642 Presence of left artificial hip joint: Secondary | ICD-10-CM | POA: Diagnosis not present

## 2023-09-27 DIAGNOSIS — Z9181 History of falling: Secondary | ICD-10-CM | POA: Diagnosis not present

## 2023-09-27 DIAGNOSIS — E1142 Type 2 diabetes mellitus with diabetic polyneuropathy: Secondary | ICD-10-CM | POA: Diagnosis not present

## 2023-09-27 DIAGNOSIS — Z7984 Long term (current) use of oral hypoglycemic drugs: Secondary | ICD-10-CM | POA: Diagnosis not present

## 2023-09-27 DIAGNOSIS — D500 Iron deficiency anemia secondary to blood loss (chronic): Secondary | ICD-10-CM | POA: Diagnosis not present

## 2023-09-27 DIAGNOSIS — F419 Anxiety disorder, unspecified: Secondary | ICD-10-CM | POA: Diagnosis not present

## 2023-09-29 ENCOUNTER — Telehealth: Payer: Self-pay | Admitting: *Deleted

## 2023-09-29 NOTE — Telephone Encounter (Signed)
 Copied from CRM [id number]. Topic: Referral - Request for Referral >> Sep 29, 2023  1:46 PM [place] J wrote: Did the patient discuss referral with their provider in the last year? No (If No - schedule appointment) (If Yes - send message)  Appointment offered? No  Type of order/referral and detailed reason for visit:  Stafford Eagles, a speech therapist calling from [facility] would like if a referral could be sent in for the patient to begin speech, occupational, and physical therapy.  Preference of office, provider, location:   [facility] [address] [place], [place], [place] [address] Number: [phone]  If referral order, have you been seen by this specialty before? No (If Yes, this issue or another issue? When? Where?  Can we respond through MyChart? Yes  *PLEASE CALL ELIZA FOR ANY UPDATES ON THE REFERRAL: [phone]*

## 2023-10-01 ENCOUNTER — Other Ambulatory Visit: Payer: Self-pay | Admitting: Family Medicine

## 2023-10-01 DIAGNOSIS — R531 Weakness: Secondary | ICD-10-CM

## 2023-10-01 DIAGNOSIS — Z96642 Presence of left artificial hip joint: Secondary | ICD-10-CM

## 2023-10-01 DIAGNOSIS — E440 Moderate protein-calorie malnutrition: Secondary | ICD-10-CM

## 2023-10-01 DIAGNOSIS — G609 Hereditary and idiopathic neuropathy, unspecified: Secondary | ICD-10-CM

## 2023-10-02 ENCOUNTER — Telehealth: Payer: Self-pay | Admitting: Nutrition

## 2023-10-02 NOTE — Telephone Encounter (Signed)
 LMTRC, to get clarification to see how he is taking his medication currently. Per last visit: Patient Instructions  Please restart: - Jardiance  10 mg before breakfast   Increase: - Lantus  14 units - in am   Please change: - Lyumjev  7 units before all meals  J.Jarrett Albor,RMA

## 2023-10-02 NOTE — Telephone Encounter (Signed)
 Noted

## 2023-10-02 NOTE — Telephone Encounter (Signed)
 Left message at 10:30 on my voicemail saying his blood sugars are dropping low every morning into the 60s and low 70s. Tried calling back for more information but go his voice mail.  Told him to call Dr. Ara office with more details, and that if was not here this afternoon.

## 2023-10-03 ENCOUNTER — Telehealth: Payer: Self-pay | Admitting: Nutrition

## 2023-10-03 DIAGNOSIS — E1142 Type 2 diabetes mellitus with diabetic polyneuropathy: Secondary | ICD-10-CM | POA: Diagnosis not present

## 2023-10-03 DIAGNOSIS — Z7982 Long term (current) use of aspirin: Secondary | ICD-10-CM | POA: Diagnosis not present

## 2023-10-03 DIAGNOSIS — E1165 Type 2 diabetes mellitus with hyperglycemia: Secondary | ICD-10-CM | POA: Diagnosis not present

## 2023-10-03 DIAGNOSIS — D500 Iron deficiency anemia secondary to blood loss (chronic): Secondary | ICD-10-CM | POA: Diagnosis not present

## 2023-10-03 DIAGNOSIS — Z87891 Personal history of nicotine dependence: Secondary | ICD-10-CM | POA: Diagnosis not present

## 2023-10-03 DIAGNOSIS — E785 Hyperlipidemia, unspecified: Secondary | ICD-10-CM | POA: Diagnosis not present

## 2023-10-03 DIAGNOSIS — C7A8 Other malignant neuroendocrine tumors: Secondary | ICD-10-CM | POA: Diagnosis not present

## 2023-10-03 DIAGNOSIS — C179 Malignant neoplasm of small intestine, unspecified: Secondary | ICD-10-CM | POA: Diagnosis not present

## 2023-10-03 DIAGNOSIS — Z7984 Long term (current) use of oral hypoglycemic drugs: Secondary | ICD-10-CM | POA: Diagnosis not present

## 2023-10-03 DIAGNOSIS — N319 Neuromuscular dysfunction of bladder, unspecified: Secondary | ICD-10-CM | POA: Diagnosis not present

## 2023-10-03 DIAGNOSIS — Z85828 Personal history of other malignant neoplasm of skin: Secondary | ICD-10-CM | POA: Diagnosis not present

## 2023-10-03 DIAGNOSIS — Z794 Long term (current) use of insulin: Secondary | ICD-10-CM | POA: Diagnosis not present

## 2023-10-03 DIAGNOSIS — E1136 Type 2 diabetes mellitus with diabetic cataract: Secondary | ICD-10-CM | POA: Diagnosis not present

## 2023-10-03 DIAGNOSIS — F32A Depression, unspecified: Secondary | ICD-10-CM | POA: Diagnosis not present

## 2023-10-03 DIAGNOSIS — Z9181 History of falling: Secondary | ICD-10-CM | POA: Diagnosis not present

## 2023-10-03 DIAGNOSIS — F419 Anxiety disorder, unspecified: Secondary | ICD-10-CM | POA: Diagnosis not present

## 2023-10-03 DIAGNOSIS — I10 Essential (primary) hypertension: Secondary | ICD-10-CM | POA: Diagnosis not present

## 2023-10-03 DIAGNOSIS — Z96642 Presence of left artificial hip joint: Secondary | ICD-10-CM | POA: Diagnosis not present

## 2023-10-03 DIAGNOSIS — E440 Moderate protein-calorie malnutrition: Secondary | ICD-10-CM | POA: Diagnosis not present

## 2023-10-03 DIAGNOSIS — R1312 Dysphagia, oropharyngeal phase: Secondary | ICD-10-CM | POA: Diagnosis not present

## 2023-10-03 DIAGNOSIS — S72002D Fracture of unspecified part of neck of left femur, subsequent encounter for closed fracture with routine healing: Secondary | ICD-10-CM | POA: Diagnosis not present

## 2023-10-03 NOTE — Telephone Encounter (Signed)
 LMTRC  J.Shonna Deiter,RMA

## 2023-10-03 NOTE — Telephone Encounter (Signed)
 Message left on my machine to call him about his blood sugar readings.  I tried calling back but go his voice mail  told him to call office if readings are still dropping low, or that I will call him tomorrow around 8 AM

## 2023-10-04 ENCOUNTER — Telehealth: Payer: Self-pay | Admitting: Nutrition

## 2023-10-04 NOTE — Telephone Encounter (Signed)
Left a detailed message,   J.Derelle Cockrell,RMA  

## 2023-10-04 NOTE — Telephone Encounter (Signed)
 CGM download shown to DR. Gherghe.  Per voice and written order, patient was called back and directed to reduce his Lantus  dose to 10u q AM.  He repeated this dosage correctly to me and had no final questions.

## 2023-10-04 NOTE — Telephone Encounter (Signed)
 Spoke with patient's wife.  According to him, patient is having cognitive impairments and mistakenly thinking that he low when readings are showing 110, and thinking that a 300 blood sugar is a good reading. According to him, His Time in range is now up to almost 80% with no significant lows.  He is currently in a [facility] facility[facility] from [facility], and will be coming in to see the doctor in 2 weeks.   I suggested that I will call him and discuss what range we think he should be in, and he said that would be great.   Patient called and he reported that acS last night he was 146.  HE took 7u of Lymjev and at 10PM his blood sugar alarm went off and it read 55.  He at a small bowl of cornflakes and small glass of OJ.  In one hour it read 75 and he went to bed.  FBS toay was 110.  I downloaded the dexcom report and put in on Dr. Ara desk.

## 2023-10-04 NOTE — Telephone Encounter (Signed)
 Message left on my machine:  Patient is currently staying at the [facility] rehab center, and his nurse is Engineer, maintenance (IT).  Her number is [phone].  If more information is needed please call [name] at [phone]

## 2023-10-04 NOTE — Telephone Encounter (Signed)
 LMTRC, need to obtain where this patient facility is.   J.Yonatan Guitron,RMA

## 2023-10-10 DIAGNOSIS — Z9181 History of falling: Secondary | ICD-10-CM | POA: Diagnosis not present

## 2023-10-10 DIAGNOSIS — C7A8 Other malignant neuroendocrine tumors: Secondary | ICD-10-CM | POA: Diagnosis not present

## 2023-10-10 DIAGNOSIS — E1136 Type 2 diabetes mellitus with diabetic cataract: Secondary | ICD-10-CM | POA: Diagnosis not present

## 2023-10-10 DIAGNOSIS — N319 Neuromuscular dysfunction of bladder, unspecified: Secondary | ICD-10-CM | POA: Diagnosis not present

## 2023-10-10 DIAGNOSIS — E1165 Type 2 diabetes mellitus with hyperglycemia: Secondary | ICD-10-CM | POA: Diagnosis not present

## 2023-10-10 DIAGNOSIS — Z7984 Long term (current) use of oral hypoglycemic drugs: Secondary | ICD-10-CM | POA: Diagnosis not present

## 2023-10-10 DIAGNOSIS — F419 Anxiety disorder, unspecified: Secondary | ICD-10-CM | POA: Diagnosis not present

## 2023-10-10 DIAGNOSIS — E1142 Type 2 diabetes mellitus with diabetic polyneuropathy: Secondary | ICD-10-CM | POA: Diagnosis not present

## 2023-10-10 DIAGNOSIS — C179 Malignant neoplasm of small intestine, unspecified: Secondary | ICD-10-CM | POA: Diagnosis not present

## 2023-10-10 DIAGNOSIS — R1312 Dysphagia, oropharyngeal phase: Secondary | ICD-10-CM | POA: Diagnosis not present

## 2023-10-10 DIAGNOSIS — D500 Iron deficiency anemia secondary to blood loss (chronic): Secondary | ICD-10-CM | POA: Diagnosis not present

## 2023-10-10 DIAGNOSIS — F32A Depression, unspecified: Secondary | ICD-10-CM | POA: Diagnosis not present

## 2023-10-10 DIAGNOSIS — Z87891 Personal history of nicotine dependence: Secondary | ICD-10-CM | POA: Diagnosis not present

## 2023-10-10 DIAGNOSIS — Z7982 Long term (current) use of aspirin: Secondary | ICD-10-CM | POA: Diagnosis not present

## 2023-10-10 DIAGNOSIS — Z794 Long term (current) use of insulin: Secondary | ICD-10-CM | POA: Diagnosis not present

## 2023-10-10 DIAGNOSIS — I10 Essential (primary) hypertension: Secondary | ICD-10-CM | POA: Diagnosis not present

## 2023-10-10 DIAGNOSIS — Z96642 Presence of left artificial hip joint: Secondary | ICD-10-CM | POA: Diagnosis not present

## 2023-10-10 DIAGNOSIS — S72002D Fracture of unspecified part of neck of left femur, subsequent encounter for closed fracture with routine healing: Secondary | ICD-10-CM | POA: Diagnosis not present

## 2023-10-10 DIAGNOSIS — E785 Hyperlipidemia, unspecified: Secondary | ICD-10-CM | POA: Diagnosis not present

## 2023-10-10 DIAGNOSIS — Z85828 Personal history of other malignant neoplasm of skin: Secondary | ICD-10-CM | POA: Diagnosis not present

## 2023-10-10 DIAGNOSIS — E440 Moderate protein-calorie malnutrition: Secondary | ICD-10-CM | POA: Diagnosis not present

## 2023-10-12 DIAGNOSIS — N319 Neuromuscular dysfunction of bladder, unspecified: Secondary | ICD-10-CM | POA: Diagnosis not present

## 2023-10-12 DIAGNOSIS — E1142 Type 2 diabetes mellitus with diabetic polyneuropathy: Secondary | ICD-10-CM | POA: Diagnosis not present

## 2023-10-12 DIAGNOSIS — C179 Malignant neoplasm of small intestine, unspecified: Secondary | ICD-10-CM | POA: Diagnosis not present

## 2023-10-12 DIAGNOSIS — Z7982 Long term (current) use of aspirin: Secondary | ICD-10-CM | POA: Diagnosis not present

## 2023-10-12 DIAGNOSIS — E1136 Type 2 diabetes mellitus with diabetic cataract: Secondary | ICD-10-CM | POA: Diagnosis not present

## 2023-10-12 DIAGNOSIS — Z7984 Long term (current) use of oral hypoglycemic drugs: Secondary | ICD-10-CM | POA: Diagnosis not present

## 2023-10-12 DIAGNOSIS — Z96642 Presence of left artificial hip joint: Secondary | ICD-10-CM | POA: Diagnosis not present

## 2023-10-12 DIAGNOSIS — S72002D Fracture of unspecified part of neck of left femur, subsequent encounter for closed fracture with routine healing: Secondary | ICD-10-CM | POA: Diagnosis not present

## 2023-10-12 DIAGNOSIS — Z87891 Personal history of nicotine dependence: Secondary | ICD-10-CM | POA: Diagnosis not present

## 2023-10-12 DIAGNOSIS — F419 Anxiety disorder, unspecified: Secondary | ICD-10-CM | POA: Diagnosis not present

## 2023-10-12 DIAGNOSIS — E1165 Type 2 diabetes mellitus with hyperglycemia: Secondary | ICD-10-CM | POA: Diagnosis not present

## 2023-10-12 DIAGNOSIS — F32A Depression, unspecified: Secondary | ICD-10-CM | POA: Diagnosis not present

## 2023-10-12 DIAGNOSIS — Z85828 Personal history of other malignant neoplasm of skin: Secondary | ICD-10-CM | POA: Diagnosis not present

## 2023-10-12 DIAGNOSIS — C7A8 Other malignant neuroendocrine tumors: Secondary | ICD-10-CM | POA: Diagnosis not present

## 2023-10-12 DIAGNOSIS — Z794 Long term (current) use of insulin: Secondary | ICD-10-CM | POA: Diagnosis not present

## 2023-10-12 DIAGNOSIS — Z9181 History of falling: Secondary | ICD-10-CM | POA: Diagnosis not present

## 2023-10-12 DIAGNOSIS — D500 Iron deficiency anemia secondary to blood loss (chronic): Secondary | ICD-10-CM | POA: Diagnosis not present

## 2023-10-12 DIAGNOSIS — I10 Essential (primary) hypertension: Secondary | ICD-10-CM | POA: Diagnosis not present

## 2023-10-12 DIAGNOSIS — E785 Hyperlipidemia, unspecified: Secondary | ICD-10-CM | POA: Diagnosis not present

## 2023-10-12 DIAGNOSIS — E440 Moderate protein-calorie malnutrition: Secondary | ICD-10-CM | POA: Diagnosis not present

## 2023-10-12 DIAGNOSIS — R1312 Dysphagia, oropharyngeal phase: Secondary | ICD-10-CM | POA: Diagnosis not present

## 2023-10-16 DIAGNOSIS — C179 Malignant neoplasm of small intestine, unspecified: Secondary | ICD-10-CM | POA: Diagnosis not present

## 2023-10-16 DIAGNOSIS — E440 Moderate protein-calorie malnutrition: Secondary | ICD-10-CM | POA: Diagnosis not present

## 2023-10-16 DIAGNOSIS — E1136 Type 2 diabetes mellitus with diabetic cataract: Secondary | ICD-10-CM | POA: Diagnosis not present

## 2023-10-16 DIAGNOSIS — S72002D Fracture of unspecified part of neck of left femur, subsequent encounter for closed fracture with routine healing: Secondary | ICD-10-CM | POA: Diagnosis not present

## 2023-10-16 DIAGNOSIS — F419 Anxiety disorder, unspecified: Secondary | ICD-10-CM | POA: Diagnosis not present

## 2023-10-16 DIAGNOSIS — E1142 Type 2 diabetes mellitus with diabetic polyneuropathy: Secondary | ICD-10-CM | POA: Diagnosis not present

## 2023-10-16 DIAGNOSIS — E785 Hyperlipidemia, unspecified: Secondary | ICD-10-CM | POA: Diagnosis not present

## 2023-10-16 DIAGNOSIS — Z96642 Presence of left artificial hip joint: Secondary | ICD-10-CM | POA: Diagnosis not present

## 2023-10-16 DIAGNOSIS — Z87891 Personal history of nicotine dependence: Secondary | ICD-10-CM | POA: Diagnosis not present

## 2023-10-16 DIAGNOSIS — D500 Iron deficiency anemia secondary to blood loss (chronic): Secondary | ICD-10-CM | POA: Diagnosis not present

## 2023-10-16 DIAGNOSIS — R1312 Dysphagia, oropharyngeal phase: Secondary | ICD-10-CM | POA: Diagnosis not present

## 2023-10-16 DIAGNOSIS — C7A8 Other malignant neuroendocrine tumors: Secondary | ICD-10-CM | POA: Diagnosis not present

## 2023-10-16 DIAGNOSIS — I10 Essential (primary) hypertension: Secondary | ICD-10-CM | POA: Diagnosis not present

## 2023-10-16 DIAGNOSIS — Z9181 History of falling: Secondary | ICD-10-CM | POA: Diagnosis not present

## 2023-10-16 DIAGNOSIS — N319 Neuromuscular dysfunction of bladder, unspecified: Secondary | ICD-10-CM | POA: Diagnosis not present

## 2023-10-16 DIAGNOSIS — Z85828 Personal history of other malignant neoplasm of skin: Secondary | ICD-10-CM | POA: Diagnosis not present

## 2023-10-16 DIAGNOSIS — F32A Depression, unspecified: Secondary | ICD-10-CM | POA: Diagnosis not present

## 2023-10-16 DIAGNOSIS — E1165 Type 2 diabetes mellitus with hyperglycemia: Secondary | ICD-10-CM | POA: Diagnosis not present

## 2023-10-16 DIAGNOSIS — Z7982 Long term (current) use of aspirin: Secondary | ICD-10-CM | POA: Diagnosis not present

## 2023-10-16 DIAGNOSIS — Z794 Long term (current) use of insulin: Secondary | ICD-10-CM | POA: Diagnosis not present

## 2023-10-16 DIAGNOSIS — Z7984 Long term (current) use of oral hypoglycemic drugs: Secondary | ICD-10-CM | POA: Diagnosis not present

## 2023-10-17 DIAGNOSIS — S72042D Displaced fracture of base of neck of left femur, subsequent encounter for closed fracture with routine healing: Secondary | ICD-10-CM | POA: Diagnosis not present

## 2023-10-19 DIAGNOSIS — G709 Myoneural disorder, unspecified: Secondary | ICD-10-CM | POA: Diagnosis not present

## 2023-10-19 DIAGNOSIS — E1140 Type 2 diabetes mellitus with diabetic neuropathy, unspecified: Secondary | ICD-10-CM | POA: Diagnosis not present

## 2023-10-19 DIAGNOSIS — C7A8 Other malignant neuroendocrine tumors: Secondary | ICD-10-CM | POA: Diagnosis not present

## 2023-10-19 DIAGNOSIS — S72002A Fracture of unspecified part of neck of left femur, initial encounter for closed fracture: Secondary | ICD-10-CM | POA: Diagnosis not present

## 2023-10-20 ENCOUNTER — Telehealth: Payer: Self-pay | Admitting: *Deleted

## 2023-10-20 DIAGNOSIS — C179 Malignant neoplasm of small intestine, unspecified: Secondary | ICD-10-CM | POA: Diagnosis not present

## 2023-10-20 DIAGNOSIS — Z9181 History of falling: Secondary | ICD-10-CM | POA: Diagnosis not present

## 2023-10-20 DIAGNOSIS — E785 Hyperlipidemia, unspecified: Secondary | ICD-10-CM | POA: Diagnosis not present

## 2023-10-20 DIAGNOSIS — E1136 Type 2 diabetes mellitus with diabetic cataract: Secondary | ICD-10-CM | POA: Diagnosis not present

## 2023-10-20 DIAGNOSIS — F419 Anxiety disorder, unspecified: Secondary | ICD-10-CM | POA: Diagnosis not present

## 2023-10-20 DIAGNOSIS — D500 Iron deficiency anemia secondary to blood loss (chronic): Secondary | ICD-10-CM | POA: Diagnosis not present

## 2023-10-20 DIAGNOSIS — F32A Depression, unspecified: Secondary | ICD-10-CM | POA: Diagnosis not present

## 2023-10-20 DIAGNOSIS — Z794 Long term (current) use of insulin: Secondary | ICD-10-CM | POA: Diagnosis not present

## 2023-10-20 DIAGNOSIS — R1312 Dysphagia, oropharyngeal phase: Secondary | ICD-10-CM | POA: Diagnosis not present

## 2023-10-20 DIAGNOSIS — E440 Moderate protein-calorie malnutrition: Secondary | ICD-10-CM | POA: Diagnosis not present

## 2023-10-20 DIAGNOSIS — Z87891 Personal history of nicotine dependence: Secondary | ICD-10-CM | POA: Diagnosis not present

## 2023-10-20 DIAGNOSIS — S72002D Fracture of unspecified part of neck of left femur, subsequent encounter for closed fracture with routine healing: Secondary | ICD-10-CM | POA: Diagnosis not present

## 2023-10-20 DIAGNOSIS — Z7982 Long term (current) use of aspirin: Secondary | ICD-10-CM | POA: Diagnosis not present

## 2023-10-20 DIAGNOSIS — N319 Neuromuscular dysfunction of bladder, unspecified: Secondary | ICD-10-CM | POA: Diagnosis not present

## 2023-10-20 DIAGNOSIS — Z96642 Presence of left artificial hip joint: Secondary | ICD-10-CM | POA: Diagnosis not present

## 2023-10-20 DIAGNOSIS — Z85828 Personal history of other malignant neoplasm of skin: Secondary | ICD-10-CM | POA: Diagnosis not present

## 2023-10-20 DIAGNOSIS — G629 Polyneuropathy, unspecified: Secondary | ICD-10-CM

## 2023-10-20 DIAGNOSIS — E1142 Type 2 diabetes mellitus with diabetic polyneuropathy: Secondary | ICD-10-CM | POA: Diagnosis not present

## 2023-10-20 DIAGNOSIS — I10 Essential (primary) hypertension: Secondary | ICD-10-CM | POA: Diagnosis not present

## 2023-10-20 DIAGNOSIS — Z7984 Long term (current) use of oral hypoglycemic drugs: Secondary | ICD-10-CM | POA: Diagnosis not present

## 2023-10-20 DIAGNOSIS — C7A8 Other malignant neuroendocrine tumors: Secondary | ICD-10-CM | POA: Diagnosis not present

## 2023-10-20 DIAGNOSIS — E1165 Type 2 diabetes mellitus with hyperglycemia: Secondary | ICD-10-CM | POA: Diagnosis not present

## 2023-10-20 NOTE — Telephone Encounter (Signed)
 Copied from CRM [id number]. Topic: Referral - Request for Referral >> Oct 20, 2023  4:03 PM Martin[place] E wrote: Did the patient discuss referral with their provider in the last year? Yes (If No - schedule appointment) (If Yes - send message)  Appointment offered? No  Type of order/referral and detailed reason for visit: Podiatry - foot neuropathy. Patient stated PCP is aware of this issue.  Preference of office, provider, location: Any location within [facility].  If referral order, have you been seen by this specialty before? No (If Yes, this issue or another issue? When? Where?  Can we respond through MyChart? Yes  Referral placed as requested.

## 2023-10-23 ENCOUNTER — Encounter: Payer: Self-pay | Admitting: Internal Medicine

## 2023-10-23 ENCOUNTER — Ambulatory Visit (INDEPENDENT_AMBULATORY_CARE_PROVIDER_SITE_OTHER): Admitting: Internal Medicine

## 2023-10-23 VITALS — BP 120/62 | HR 78 | Ht 71.0 in | Wt 148.8 lb

## 2023-10-23 DIAGNOSIS — E7849 Other hyperlipidemia: Secondary | ICD-10-CM

## 2023-10-23 DIAGNOSIS — Z7984 Long term (current) use of oral hypoglycemic drugs: Secondary | ICD-10-CM | POA: Diagnosis not present

## 2023-10-23 DIAGNOSIS — E1159 Type 2 diabetes mellitus with other circulatory complications: Secondary | ICD-10-CM | POA: Diagnosis not present

## 2023-10-23 DIAGNOSIS — Z794 Long term (current) use of insulin: Secondary | ICD-10-CM

## 2023-10-23 DIAGNOSIS — E1165 Type 2 diabetes mellitus with hyperglycemia: Secondary | ICD-10-CM

## 2023-10-23 LAB — POCT GLYCOSYLATED HEMOGLOBIN (HGB A1C): Hemoglobin A1C: 6.3 % — AB (ref 4.0–5.6)

## 2023-10-23 NOTE — Patient Instructions (Addendum)
 Please continue: - Jardiance  10 mg before breakfast - Lantus  10 units in am - Lyumjev  7 units before all meals, but take 10 units before a high carb breakfast (cereals, oatmeal, or granola)  Please return in 3-4 months.

## 2023-10-23 NOTE — Progress Notes (Signed)
 Patient ID: Andrew Tanner, male   DOB: [birth year], 73 y.o.   MRN: [id number]  HPI: Andrew Tanner is a 73 y.o.-year-old male, initially referred by his PCP, Dr. Wendolyn, returning for follow-up for DM2, dx in ~2010, insulin -dependent, uncontrolled, with complications (aortic atherosclerosis, peripheral neuropathy).  Last visit 2.5 months ago.  He is here with his wife who offers part of the history including medical events since last visit, diet, activity.  Interim history: No increased urination, blurry vision, nausea, chest pain.  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  He had a complicated course afterwards, including a pancreatic leak.  He had many falls and had a left femoral fracture 07/12/2023, for which he had to have left THR.  He then used to reside in [facility] care in [place].  He is having physical therapy there, but only approximately 1-2 times a week.  He has severe deconditioning and cognitive difficulties. He lost approximately 32 lbs in the last 9 months. He is currently recovering from left hip replacement after he had a hip fracture in 07/2023.  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  Reviewed HbA1c: Lab Results  Component Value Date   HGBA1C 7.3 (A) 08/11/2023   HGBA1C 6.8 (H) 04/21/2023   HGBA1C 7.0 (H) 02/09/2023   HGBA1C 7.8 (H) 07/25/2022   HGBA1C 8.3 (H) 04/25/2022   HGBA1C 7.4 (H) 10/04/2021   HGBA1C 7.1 (H) 07/15/2021   HGBA1C 7.0 (H) 04/06/2021   HGBA1C 6.8 (A) 12/21/2020   HGBA1C 6.7 (H) 08/17/2020  12/02/2022: HbA1c 8.1%  Pt is on a regimen of: - Metformin  1000 mg 2x a day, with meals >> stopped  in the hospital  - Jardiance  25 mg before breakfast >> stopped in  the hospital >> restarted 10 mg daily - Lantus  10 units at bedtime-added 01/18/2023 >> 12 units at night >> 14 >> 10 units in a.m. (decreased 2 weeks ago due to lows) - Lyumjev  3 to 6 units before dinner-started 01/2023 >> 7 units twice a day-with lunch and dinner >> 6 units: Only with lunch >> 7 units before each meal He was previously on glipizide  10 mg before breakfast-stopped 01/2023.  Pt checks his sugars >4 times a day with his CGM:    Previously:    Previously:   Lowest sugar was 90 >> 57 >> >100 >> 50s; ?hypoglycemia awareness. Highest sugar was 373 >> 200 >> 300s >> 360.  Glucometer: FreeStyle  Pt's meals are: - Breakfast: Rice checks cereals, Greek yogurt, blueberries, other fruit,  >> English muffin + PB - Lunch: Sandwich with meat on whole-grain bread - Dinner: Meat, beans, sweet potatoes - Snacks: 2, but trying to cut down.  These are usually high carb snacks. He is exercising every day along with walking. Stretching and strenghthening + walking 5 days a week. He lost wt (from 230 lbs) and lost 40 lbs since then.  - no CKD, last BUN/creatinine:  Lab Results  Component Value Date   BUN 11 07/14/2023   BUN 8 07/13/2023   CREATININE 0.89 07/14/2023   CREATININE 0.50 (L) 07/13/2023   Lab Results  Component Value Date   MICRALBCREAT 3 03/14/2023   MICRALBCREAT 27 12/31/2018   MICRALBCREAT 4.1 01/01/2018  He is not on ACE inhibitor/ARB.  -+  HL; last set of lipids: Lab Results  Component Value Date   CHOL 126 07/25/2022   HDL 38.40 (L) 07/25/2022   LDLCALC 66 07/25/2022   TRIG 112.0 07/25/2022   CHOLHDL 3 07/25/2022  He is on Crestor  20 mg daily.  - last eye exam was on 08/16/2022. No DR. He has a history of bilateral cataract surgery.  - + neuropathy; + numbness and tingling in his R > L foot Also, cold feet. He has leg weakness and balance pbs. He was approved for the implanted device -planning to get this in the near future.  Last foot exam  03/14/2023.  Pt has FH of DM in father (DM1), brother.  He also has a history of HTN, interstitial cystitis, history of cervical spine surgery - 2005?, depression/anxiety. He has an atrial myxoma.  He is on thiamine.  ROS: + See HPI  Past Medical History:  Diagnosis Date   Anxiety    Atrial myxoma    Basal cell carcinoma    Cancer (HCC)    basal cell skin CA   Cataract    Depression    Diabetes mellitus without complication (HCC)    Diabetic neuropathy (HCC)    Duodenal adenoma    Hyperlipidemia    Hypertension    IC (interstitial cystitis)    Neuromuscular disorder (HCC) 01/09/21   Peripheral  Neuropathy   Pancreatic lesion    Squamous cell carcinoma of skin    Past Surgical History:  Procedure Laterality Date   BIOPSY  12/05/2022   Procedure: BIOPSY;  Surgeon: Wilhelmenia Aloha Raddle., MD;  Location: [facility];  Service: Gastroenterology;;   CATARACT EXTRACTION, BILATERAL     ESOPHAGOGASTRODUODENOSCOPY N/A 12/05/2022   Procedure: ESOPHAGOGASTRODUODENOSCOPY (EGD);  Surgeon: Wilhelmenia Aloha Raddle., MD;  Location: [facility];  Service: Gastroenterology;  Laterality: N/A;   EUS N/A 12/05/2022   Procedure: UPPER ENDOSCOPIC ULTRASOUND (EUS) RADIAL;  Surgeon: Wilhelmenia Aloha Raddle., MD;  Location: [facility];  Service: Gastroenterology;  Laterality: N/A;   FINE NEEDLE ASPIRATION N/A 12/05/2022   Procedure: FINE NEEDLE ASPIRATION (FNA) LINEAR;  Surgeon: Wilhelmenia Aloha Raddle., MD;  Location: [facility];  Service: Gastroenterology;  Laterality: N/A;   HERNIA REPAIR N/A    inguinal   NECK SURGERY     ant/post   SPINE SURGERY     SUBMUCOSAL TATTOO INJECTION  12/05/2022   Procedure: SUBMUCOSAL TATTOO INJECTION;  Surgeon: Wilhelmenia Aloha Raddle., MD;  Location: [facility];  Service: Gastroenterology;;   TOTAL HIP ARTHROPLASTY Left 07/13/2023   Procedure: ARTHROPLASTY, HIP, TOTAL, ANTERIOR APPROACH;  Surgeon: Kendal Franky SQUIBB, MD;  Location: [facility];  Service: Orthopedics;   Laterality: Left;  [employer] bipolar Hana table  hemi hip   TRANSURETHRAL RESECTION OF PROSTATE     TURP VAPORIZATION     WHIPPLE PROCEDURE N/A 04/27/2023   Procedure: WHIPPLE PROCEDURE;  Surgeon: Aron Shoulders, MD;  Location: [facility];  Service: General;  Laterality: N/A;   Social History   Socioeconomic History   Marital status: Married    Spouse name: [name] Speed   Number of children: 1   Years of education: Not on file   Highest education level: Bachelor's degree (e.g., BA, AB, BS)  Occupational History   Not on file  Tobacco Use   Smoking status: Former    Current packs/day: 0.00    Average packs/day: 1 pack/day for 5.0 years (5.0 ttl pk-yrs)    Types: Cigarettes    Start date: 03/02/1977    Quit date:  03/02/1982    Years since quitting: 41.6   Smokeless tobacco: Never   Tobacco comments:    quit 28 yrs ago  Vaping Use   Vaping status: Never Used  Substance and Sexual Activity   Alcohol use: Not Currently    Comment: none   Drug use: No   Sexual activity: Not Currently    Birth control/protection: Abstinence  Other Topics Concern   Not on file  Social History Narrative   Retired Risk analyst, 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   Social Drivers of Corporate investment banker Strain: Low Risk  (03/16/2023)   Overall Financial Resource Strain (CARDIA)    Difficulty of Paying Living Expenses: Not very hard  Food Insecurity: No Food Insecurity (07/13/2023)   Hunger Vital Sign    Worried About Running Out of Food in the Last Year: Never true    Ran Out of Food in the Last Year: Never true  Transportation Needs: No Transportation Needs (07/13/2023)   PRAPARE - Administrator, Civil Service (Medical): No    Lack of Transportation (Non-Medical): No  Physical Activity: Sufficiently Active (03/16/2023)   Exercise Vital Sign    Days of Exercise per Week: 5 days    Minutes of Exercise per Session: 40 min  Stress: No Stress Concern Present (03/16/2023)    [employer] of Occupational Health - Occupational Stress Questionnaire    Feeling of Stress : Only a little  Social Connections: Socially Integrated (07/13/2023)   Social Connection and Isolation Panel    Frequency of Communication with Friends and Family: More than three times a week    Frequency of Social Gatherings with Friends and Family: Once a week    Attends Religious Services: More than 4 times per year    Active Member of [employer] or Organizations: Yes    Attends Engineer, structural: More than 4 times per year    Marital Status: Married  Catering manager Violence: Not At Risk (07/13/2023)   Humiliation, Afraid, Rape, and Kick questionnaire    Fear of Current or Ex-Partner: No    Emotionally Abused: No    Physically Abused: No    Sexually Abused: No   Current Outpatient Medications on File Prior to Visit  Medication Sig Dispense Refill   acetaminophen  (TYLENOL ) 500 MG tablet Take 2 tablets (1,000 mg total) by mouth every 6 (six) hours as needed for mild pain (pain score 1-3). 100 tablet 0   alfuzosin  (UROXATRAL ) 10 MG 24 hr tablet Take 1 tablet (10 mg total) by mouth daily with breakfast. (Patient taking differently: Take 10 mg by mouth daily.) 90 tablet 3   aspirin  325 MG tablet Take 325 mg by mouth daily.     clonazePAM  (KLONOPIN ) 0.5 MG tablet Take 1.5 mg by mouth at bedtime.     Continuous Glucose Sensor (DEXCOM G7 SENSOR) MISC Use to monitor glucose continuously, change every 10 days 6 each 3   empagliflozin  (JARDIANCE ) 10 MG TABS tablet Take 1 tablet (10 mg total) by mouth daily before breakfast. 90 tablet 3   ferrous sulfate  (FEROSUL) 325 (65 FE) MG tablet Take 325 mg by mouth 2 (two) times daily.     fluconazole  (DIFLUCAN ) 150 MG tablet Take 1 tablet (150 mg total) by mouth as directed. Take one tablet now then 1 tablet in 7 days 2 tablet 0   FLUoxetine  (PROZAC ) 20 MG capsule Take 20 mg by mouth daily.     glucose blood  test strip 1 each by Other route in the  morning and at bedtime. Use as instructed checking once per day, or if new symptoms. 100 each 1   hydrOXYzine  (ATARAX /VISTARIL ) 50 MG tablet Take 50 mg by mouth at bedtime as needed (sleep/anxiety.).     insulin  glargine (LANTUS  SOLOSTAR) 100 UNIT/ML Solostar Pen Inject 14 Units into the skin daily. 30 mL 3   Insulin  Lispro-aabc (LYUMJEV  KWIKPEN) 100 UNIT/ML KwikPen Inject 7 Units into the skin 3 (three) times daily before meals. 30 mL 3   Insulin  Pen Needle 32G X 4 MM MISC Use 2x a day 200 each 3   ketoconazole  (NIZORAL ) 2 % shampoo APPLY TO SCALP AND FACE 2 TO 3 TIMES A WEEK. (Patient taking differently: Apply 1 Application topically every Monday, Wednesday, and Friday.) 120 mL 0   lipase/protease/amylase (CREON ) 36000 UNITS CPEP capsule Take 2 capsules (72,000 Units total) by mouth 3 (three) times daily before meals. 180 capsule 5   megestrol  (MEGACE ) 40 MG/ML suspension Take 10 mLs by mouth daily.     nystatin -triamcinolone  ointment (MYCOLOG) Apply 1 Application topically 2 (two) times daily. 30 g 0   polyethylene glycol (MIRALAX  / GLYCOLAX ) 17 g packet Take 17 g by mouth daily as needed (constipation.).     QUEtiapine (SEROQUEL) 25 MG tablet Take 25 mg by mouth at bedtime.     Vibegron (GEMTESA) 75 MG TABS Take by mouth.     No current facility-administered medications on file prior to visit.   No Known Allergies Family History  Problem Relation Age of Onset   Lung cancer Mother    Heart disease Father    Hyperlipidemia Father    Diabetes Father        type 1   Diabetes Brother    Depression Brother    Renal cancer Brother    Anxiety disorder Brother    Stomach cancer Neg Hx    Colon cancer Neg Hx    Pancreatic cancer Neg Hx    Esophageal cancer Neg Hx    Rectal cancer Neg Hx    PE: BP 120/62   Pulse 78   Ht 5' 11 (1.803 m)   Wt 148 lb 12.8 oz (67.5 kg)   SpO2 97%   BMI 20.75 kg/m  Wt Readings from Last 15 Encounters:  10/23/23 148 lb 12.8 oz (67.5 kg)  08/11/23 145  lb (65.8 kg)  07/13/23 (P) 150 lb (68 kg)  06/26/23 149 lb (67.6 kg)  06/20/23 148 lb (67.1 kg)  06/08/23 154 lb 8.7 oz (70.1 kg)  04/27/23 178 lb (80.7 kg)  04/21/23 178 lb 4.8 oz (80.9 kg)  04/18/23 175 lb 5 oz (79.5 kg)  03/20/23 176 lb 2 oz (79.9 kg)  03/14/23 176 lb 12.8 oz (80.2 kg)  02/13/23 177 lb (80.3 kg)  02/09/23 177 lb 6.4 oz (80.5 kg)  02/08/23 179 lb (81.2 kg)  01/20/23 178 lb (80.7 kg)   Constitutional: frail, thin, in NAD, in wheelchair Eyes:  EOMI, no exophthalmos ENT: no neck masses, no cervical lymphadenopathy Cardiovascular: RRR, No MRG Respiratory: CTA B Musculoskeletal: no deformities Skin:no rashes Neurological: no tremor with outstretched hands  ASSESSMENT: 1. DM2, insulin -dependent, uncontrolled, with complications - PN - aortic atherosclerosis (on CT abdomen and pelvis from 12/08/2022)  2. HL  PLAN:  1. Patient with longstanding, uncontrolled, type 2 diabetes, on oral antidiabetic regimen with SGLT2 inhibitor and also basal-bolus insulin  regimen, with worsening control after his Whipple procedure in 04/2023. - At  last visit sugars were much higher than before, increasing significantly after breakfast and remaining elevated until midnight.  Blood sugars started to improve after dinner but they were still mostly above 180 after this meal.  Upon questioning, he was off metformin  and Jardiance , was taking Lantus  at night but Lyumjev  only before lunch and a lower dose than recommended.  I did not suggest to go back on metformin  to avoid further weight loss but I felt he could benefit from Jardiance  at a lower dose.  I also recommended to increase the Lantus  dose and move it to the morning and recommended Lyumjev  before each meal.  Since he was in a facility, I recommended to use a fixed dose of 7 units per meal. CGM interpretation: -At today's visit, we reviewed his CGM downloads: It appears that 71% of values are in target range (goal >70%), while 29% are  higher than 180 (goal <25%), and 0% are lower than 70 (goal <4%).  The calculated average blood sugar is 159.  The projected HbA1c for the next 3 months (GMI) is 7.1%. -Reviewing the CGM trends, sugars appear to be worse in the last 2 weeks, after decreasing the dose of his Lantus .  However, he was having low blood sugars during the night and in the morning on the higher dose and he does not have them anymore.  His sugars are high after breakfast after having a high carb meal, including cereals with milk, granola with yogurt, or oatmeal.  He is trying to eat eggs with toast, and when he does that, sugars are much better.  However, he cannot always obtain them in the facility and we discussed that if he has more carb rich breakfast, he can take more Lyumjev , after 10 units, for example.  He will try this.  Otherwise, I did not recommend a change in regimen. - I suggested to:  Patient Instructions  Please continue: - Jardiance  10 mg before breakfast - Lantus  10 units in am - Lyumjev  7 units before all meals, but take 10 units before a high carb breakfast (cereals, oatmeal, or granola)  Please return in 3-4 months.  - we checked his HbA1c: 6.3% (lower) - advised to check sugars at different times of the day - 4x a day, rotating check times - advised for yearly eye exams >> he is due -was not able to obtain this due to being in the facility - return to clinic in 3-4 months  2. HL -Latest lipid panel was reviewed from last year: LDL slightly above our target of less than 55, HDL low: Lab Results  Component Value Date   CHOL 126 07/25/2022   HDL 38.40 (L) 07/25/2022   LDLCALC 66 07/25/2022   TRIG 112.0 07/25/2022   CHOLHDL 3 07/25/2022  - He continues on Crestor  20 mg daily without side effects - he is due for another lipid panel >> prefers to wait until next OV  Lela Fendt, MD PhD [facility]

## 2023-10-25 DIAGNOSIS — R351 Nocturia: Secondary | ICD-10-CM | POA: Diagnosis not present

## 2023-10-25 DIAGNOSIS — R3915 Urgency of urination: Secondary | ICD-10-CM | POA: Diagnosis not present

## 2023-10-25 DIAGNOSIS — R350 Frequency of micturition: Secondary | ICD-10-CM | POA: Diagnosis not present

## 2023-10-31 ENCOUNTER — Encounter: Payer: Self-pay | Admitting: Cardiology

## 2023-10-31 ENCOUNTER — Ambulatory Visit: Attending: Cardiology | Admitting: Cardiology

## 2023-10-31 VITALS — BP 122/74 | HR 74 | Resp 16 | Ht 71.0 in | Wt 152.8 lb

## 2023-10-31 DIAGNOSIS — D151 Benign neoplasm of heart: Secondary | ICD-10-CM | POA: Diagnosis not present

## 2023-10-31 DIAGNOSIS — I10 Essential (primary) hypertension: Secondary | ICD-10-CM

## 2023-10-31 MED ORDER — ASPIRIN 81 MG PO TBEC: 81.0000 mg | DELAYED_RELEASE_TABLET | Freq: Every day | ORAL | Status: AC

## 2023-10-31 NOTE — Progress Notes (Signed)
 Cardiology Office Note:  .   Date:  10/31/2023  ID:  Andrew Tanner, DOB [birth year], MRN [id number] PCP: Wendolyn Jenkins Jansky, MD  [facility] Providers Cardiologist:  Newman Lawrence, MD PCP: Wendolyn Jenkins Jansky, MD  Chief Complaint  Patient presents with   Follow-up   Atrial myxoma     Andrew Tanner is a 73 y.o. 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 Alveta in 01/2023 to for Whipple's procedure for pancreatic cyst.  There was incidental finding of mass attached to interatrial septum, most likely atrial myxoma.  TEE was recommended, but do not think he underwent the same as other major surgeries to procedure.  Since then, he underwent Whipple surgery and resection of part of his duodenum due to a duodenal adenoma.  He also had a hip fracture and had to undergo hip surgery.  Since then, he has had protracted recovery.  Requiring repeat hospitalization, significant weight loss, prolonged physical therapy.  He is currently in a private rehab facility.  Patient is here with his wife today.  He denies chest pain, shortness of breath, palpitations, leg edema, orthopnea, PND, TIA/syncope.  Wife expresses concern about short-term memory.  Patient has an appointment to see Dr. Margaret for the same.  Of note, patient underwent CT head with his fall in 06/2023, which showed no prior strokes.    Vitals:   10/31/23 1316  BP: 122/74  Pulse: 74  Resp: 16  SpO2: 97%      Review of Systems  Cardiovascular:  Negative for chest pain, dyspnea on exertion, leg swelling, palpitations and syncope.        Studies Reviewed: SABRA        Labs 07/2022: Chol 126, TG 112, HDL 38, LDL 66 HbA1C 6.3% Hb 9.6 Cr 0.8 TSH 1.1  Echocardiogram 02/2023:  1. 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   2. Left ventricular ejection fraction, by estimation, is 55 to 60%. The  left  ventricle has normal function. The left ventricle has no regional  wall motion abnormalities. There is mild left ventricular hypertrophy of  the basal-septal segment. Left  ventricular diastolic parameters are consistent with Grade I diastolic  dysfunction (impaired relaxation).   3. Right ventricular systolic function is normal. The right ventricular  size is normal.   4. The mitral valve is normal in structure. Trivial mitral valve  regurgitation. No evidence of mitral stenosis.   5. The aortic valve is tricuspid. Aortic valve regurgitation is not  visualized. No aortic stenosis is present.   6. The inferior vena cava is normal in size with greater than 50%  respiratory variability, suggesting right atrial pressure of 3 mmHg.    Physical Exam Vitals and nursing note reviewed.  Constitutional:      General: He is not in acute distress. Neck:     Vascular: No JVD.  Cardiovascular:     Rate and Rhythm: Normal rate and regular rhythm.     Heart sounds: Normal heart sounds. No murmur heard. Pulmonary:     Effort: Pulmonary effort is normal.     Breath sounds: Normal breath sounds. No wheezing or rales.  Musculoskeletal:     Right lower leg: No edema.     Left lower leg: No edema.      VISIT DIAGNOSES:   ICD-10-CM   1. Primary hypertension  I10  2. Atrial myxoma  D15.1        Andrew Tanner is a 73 y.o. 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  This was noted on preop echocardiogram 02/2023.  He has no symptoms associated with it.  Nevertheless, thromboembolic risk remains given the size and the mobility of this mass.  He is currently on aspirin  325 mg daily.  Recommend aspirin  81 mg daily with the rationale of increasing thromboembolic risk.  Ideally, he would need further imaging and referral to CVTS for consideration for  resection of this mass, likely myxoma.  However, patient is still recovering from 2 back-to-back major surgeries and has had some recent concern about short-term memory.  Understandably, patient and wife are not ready for another major surgery at this time.  Therefore, we have mutually decided to hold off any further workup for probable myxoma.  He will keep appointment with neurologist Dr. Margaret for further discussion regarding short-term memory loss.  I will see him back in 3 months to reassess his ability to undergo further workup which may include either TEE or MRI and referral to CVTS.  Hypertension:  Well-controlled  I spent 30 minutes in the care of Andrew Tanner today including personally reviewing prior echocardiogram dated 02/2023, discussing natural history, pathophysiology, treatment options of atrial myxoma, and documenting in the encounter.       Meds ordered this encounter  Medications   aspirin  EC 81 MG tablet    Sig: Take 1 tablet (81 mg total) by mouth daily. Swallow whole.     F/u in 3 months  Signed, Newman JINNY Lawrence, MD

## 2023-10-31 NOTE — Patient Instructions (Signed)
 Medication Instructions:  STOP Aspirin  325 mg   START Aspirin  81 mg   *If you need a refill on your cardiac medications before your next appointment, please call your pharmacy*  Follow-Up: At [facility], you and your health needs are our priority.  As part of our continuing mission to provide you with exceptional heart care, our providers are all part of one team.  This team includes your primary Cardiologist (physician) and Advanced Practice Providers or APPs (Physician Assistants and Nurse Practitioners) who all work together to provide you with the care you need, when you need it.  Your next appointment:   3 month(s)  Provider:   Newman JINNY Lawrence, MD

## 2023-11-02 ENCOUNTER — Encounter: Payer: Self-pay | Admitting: Family Medicine

## 2023-11-07 DIAGNOSIS — C7A8 Other malignant neuroendocrine tumors: Secondary | ICD-10-CM | POA: Diagnosis not present

## 2023-11-07 DIAGNOSIS — D490 Neoplasm of unspecified behavior of digestive system: Secondary | ICD-10-CM | POA: Diagnosis not present

## 2023-11-07 DIAGNOSIS — E1343 Other specified diabetes mellitus with diabetic autonomic (poly)neuropathy: Secondary | ICD-10-CM | POA: Diagnosis not present

## 2023-11-07 DIAGNOSIS — D132 Benign neoplasm of duodenum: Secondary | ICD-10-CM | POA: Diagnosis not present

## 2023-11-09 ENCOUNTER — Encounter: Payer: Self-pay | Admitting: Podiatry

## 2023-11-09 ENCOUNTER — Ambulatory Visit (INDEPENDENT_AMBULATORY_CARE_PROVIDER_SITE_OTHER): Admitting: Podiatry

## 2023-11-09 DIAGNOSIS — E1142 Type 2 diabetes mellitus with diabetic polyneuropathy: Secondary | ICD-10-CM | POA: Diagnosis not present

## 2023-11-09 NOTE — Progress Notes (Signed)
 Subjective:  Patient ID: Andrew Tanner, male    DOB: 16-Feb-1951,  MRN: [id number]  Chief Complaint  Patient presents with   Diabetes   Peripheral Neuropathy    Patient concern of neuropathy. Recently prescribed Amtriptylin 10mg  and wears toe warmers for cold feet, thick socks and diabetic shoes. Has burning and tingling. IDDM A1C 6.3. Symptoms have been chronic for 3 years.     Discussed the use of AI scribe software for clinical note transcription with the patient, who gave verbal consent to proceed.  History of Present Illness Andrew Tanner is a 73 year old male with diabetic peripheral neuropathy who presents with foot pain.  He has experienced diabetic peripheral neuropathy for the past three years, initially presenting with decreased sensation in the right foot. The condition has since progressed to involve both feet, with significant intermittent pain as the primary complaint. The pain begins at the big toe, spreads across the foot, and is exacerbated by cold floors. It subsides when lying down and is absent during sleep. No wounds or ulcers have been noted on his feet.  His A1c is currently 6.3, though it has previously been as high as 10-11, which he managed to control. He has tried gabapentin  in the past, which caused adverse effects, and has used capsaicin cream without significant relief.  He underwent a trial for an HFX spinal cord stimulator, which provided some confidence but was not pursued further due to concurrent surgeries. He is considering revisiting this option now that his hip issues have been addressed.  He engages in artwork, which helps distract him from the pain. He experiences a cold sensation in his feet, which he manages by wrapping them in quilts to alleviate discomfort.  He has been prescribed amitriptyline at 10 mg but has not started taking it yet and wants to know if this is a reasonable treatment plan      Objective:    Physical  Exam VASCULAR: DP and PT pulse palpable. Foot is warm and well-perfused. Capillary fill time is brisk. Circulation is good. DERMATOLOGIC: Normal skin turgor, texture, and temperature. No open lesions, rashes, or ulcerations. Skin in good health, no infection or fungus. NEUROLOGIC: Normal sensation to light touch and pressure. Monofilament exam normal, protective sensation intact. Paresthesias and sensitivity to toes. ORTHOPEDIC: Smooth pain-free range of motion of all examined joints. No ecchymosis or bruising. No gross deformity. No pain to palpation. EXTREMITIES: No swelling in extremities. Nails normal length and appearance, no ulcerations or skin lesions.   No images are attached to the encounter.    Results A1c: 6.3   Assessment:   1. Diabetic polyneuropathy associated with type 2 diabetes mellitus (HCC)      Plan:  Patient was evaluated and treated and all questions answered.  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 A1c is well-controlled at 6.3, previously 10-11. No foot ulcers or wounds. Previous gabapentin  trial caused adverse effects. Amitriptyline has been prescribed but has not started taking, considering side effects like drowsiness, I do think this will be less of an impact in gabapentin  or Lyrica  for him and is reasonable to start utilizing to see how his responses. Alternative treatments include Lyrica  and non-pharmacological options like capsaicin cream, which was ineffective. HFX spinal cord stimulator is considered for refractory cases. Emphasis on monitoring for temperature sensation loss to prevent burns and injuries. - Start amitriptyline  for neuropathic pain management. - Refer to [facility] pain management specialist for further evaluation and management. - Discussed potential for HFX spinal cord  stimulator, he will discuss with his surgeon if the amitriptyline is ineffective - Advise caution with hot surfaces and recommend using hands to check temperatures to prevent burns. - Monitor for any changes in pain or side effects from amitriptyline.      Return if symptoms worsen or fail to improve.

## 2023-11-09 NOTE — Patient Instructions (Signed)
  VISIT SUMMARY: You came in today because of foot pain related to your diabetic peripheral neuropathy. We discussed your history with this condition, including the pain you experience and the treatments you have tried in the past. Your A1c is currently well-controlled at 6.3, which is a significant improvement from previous levels.  YOUR PLAN: -TYPE 2 DIABETES MELLITUS WITH PAINFUL DIABETIC POLYNEUROPATHY: Diabetic peripheral neuropathy is a condition where high blood sugar levels cause nerve damage, leading to pain and numbness, primarily in the feet. We will start you on amitriptyline to help manage your neuropathic pain. Be aware that this medication can cause drowsiness. If this medication does not provide sufficient relief, we may consider other options like Lyrica  or revisit the possibility of using an HFX spinal cord stimulator. It's important to monitor your feet for any loss of temperature sensation to prevent burns or injuries. Use your hands to check the temperature of surfaces before stepping on them.  INSTRUCTIONS: Please start taking amitriptyline as prescribed. We will refer you to a [facility] pain management specialist for further evaluation and management. If medication management is insufficient, we will discuss the potential for an HFX spinal cord stimulator with [facility]. Monitor for any changes in your pain levels or any side effects from the amitriptyline, and report them to us .                      Contains text generated by [employer].                                 Contains text generated by [employer].

## 2023-11-13 ENCOUNTER — Ambulatory Visit (INDEPENDENT_AMBULATORY_CARE_PROVIDER_SITE_OTHER): Admitting: Diagnostic Neuroimaging

## 2023-11-13 ENCOUNTER — Encounter: Payer: Self-pay | Admitting: Diagnostic Neuroimaging

## 2023-11-13 VITALS — BP 130/76 | HR 90 | Ht 72.0 in | Wt 152.0 lb

## 2023-11-13 DIAGNOSIS — R531 Weakness: Secondary | ICD-10-CM | POA: Diagnosis not present

## 2023-11-13 NOTE — Progress Notes (Unsigned)
 [facility]  PATIENT: Andrew Tanner DOB: 07/27/1950  REFERRING CLINICIAN: Aron Shoulders, MD HISTORY FROM: PATIENT  REASON FOR VISIT: NEW CONSULT   HISTORICAL  CHIEF COMPLAINT:  Chief Complaint  Patient presents with   MCI    Rm 7 with spouse Pt is well, reports he has been having cognitive and recall issues post  whipple surgery in jan. 2025.     HISTORY OF PRESENT ILLNESS:   73 year old male with history of diabetes, neuropathy, hypertension, hyperlipidemia here for evaluation of memory, cognitive changes and generalized weakness.  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  He was admitted to the hospital for surgical treatment with Whipple procedure.  He was discharged home with couple weeks later.  He was readmitted on 05/13/2023 after increased diarrhea, weakness and falling down at home.  He was admitted for approximately 4 weeks with ongoing vomiting, diarrhea, protein calorie malnutrition, colitis and muscular deconditioning.  This time he was discharged to skilled nursing facility.  06/26/2023 patient fell down at nursing home, hit his head, and went to the emergency room.  07/12/2023 patient fell down again resulting in the left femoral neck fracture requiring admission and surgery.  Overall patient has had significant difficulty returning to his baseline.  Wife notes that he is having some ongoing short-term memory difficulty, decreased insight, brain fog issues.  Also continues to be generally deconditioned and weak.  They live in a two-story home with the bedroom and bathroom upstairs, 14 steps, and this remains a challenge for patient to transition from skilled nursing facility back home.    REVIEW OF SYSTEMS: Full 14 system review of systems performed and negative with exception of: as per HPI.  ALLERGIES: No Known Allergies  HOME MEDICATIONS: Outpatient Medications Prior to Visit   Medication Sig Dispense Refill   acetaminophen  (TYLENOL ) 500 MG tablet Take 2 tablets (1,000 mg total) by mouth every 6 (six) hours as needed for mild pain (pain score 1-3). 100 tablet 0   alfuzosin  (UROXATRAL ) 10 MG 24 hr tablet Take 1 tablet (10 mg total) by mouth daily with breakfast. 90 tablet 3   amitriptyline (ELAVIL) 10 MG tablet Take 10 mg by mouth at bedtime.     aspirin  EC 81 MG tablet Take 1 tablet (81 mg total) by mouth daily. Swallow whole.     clonazePAM  (KLONOPIN ) 0.5 MG tablet Take 1.5 mg by mouth at bedtime.     Continuous Glucose Sensor (DEXCOM G7 SENSOR) MISC Use to monitor glucose continuously, change every 10 days 6 each 3   empagliflozin  (JARDIANCE ) 10 MG TABS tablet Take 1 tablet (10 mg total) by mouth daily before breakfast. 90 tablet 3   EQ SENNA-S 8.6-50 MG tablet Take 1 tablet by mouth daily.     ferrous sulfate  (FEROSUL) 325 (65 FE) MG tablet Take 325 mg by mouth 2 (two) times daily.     fluconazole  (DIFLUCAN ) 150 MG tablet Take 1 tablet (150 mg total) by mouth as directed. Take one tablet now then 1 tablet in 7 days 2 tablet 0   FLUoxetine  (PROZAC ) 20 MG capsule Take 20 mg by mouth daily.     glucose blood test strip 1 each by Other route in the morning and at bedtime. Use as instructed checking once per day, or if new symptoms. 100 each 1   hydrOXYzine  (ATARAX /VISTARIL ) 50 MG tablet Take 50 mg by mouth at bedtime as needed (sleep/anxiety.).     insulin   glargine (LANTUS  SOLOSTAR) 100 UNIT/ML Solostar Pen Inject 14 Units into the skin daily. 30 mL 3   Insulin  Lispro-aabc (LYUMJEV  KWIKPEN) 100 UNIT/ML KwikPen Inject 7 Units into the skin 3 (three) times daily before meals. 30 mL 3   Insulin  Pen Needle 32G X 4 MM MISC Use 2x a day 200 each 3   ketoconazole  (NIZORAL ) 2 % shampoo APPLY TO SCALP AND FACE 2 TO 3 TIMES A WEEK. 120 mL 0   lipase/protease/amylase (CREON ) 36000 UNITS CPEP capsule Take 2 capsules (72,000 Units total) by mouth 3 (three) times daily before meals.  180 capsule 5   megestrol  (MEGACE ) 40 MG/ML suspension Take 10 mLs by mouth daily.     nystatin -triamcinolone  ointment (MYCOLOG) Apply 1 Application topically 2 (two) times daily. 30 g 0   pantoprazole  (PROTONIX ) 40 MG tablet Take 40 mg by mouth daily.     polyethylene glycol (MIRALAX  / GLYCOLAX ) 17 g packet Take 17 g by mouth daily as needed (constipation.).     QUEtiapine (SEROQUEL) 25 MG tablet Take 25 mg by mouth at bedtime.     Vibegron (GEMTESA) 75 MG TABS Take by mouth.     No facility-administered medications prior to visit.    PAST MEDICAL HISTORY: Past Medical History:  Diagnosis Date   Anxiety    Atrial myxoma    Basal cell carcinoma    Cancer (HCC)    basal cell skin CA   Cataract    Depression    Diabetes mellitus without complication (HCC)    Diabetic neuropathy (HCC)    Duodenal adenoma    Hyperlipidemia    Hypertension    IC (interstitial cystitis)    Neuromuscular disorder (HCC) 01/09/21   Peripheral  Neuropathy   Pancreatic lesion    Squamous cell carcinoma of skin     PAST SURGICAL HISTORY: Past Surgical History:  Procedure Laterality Date   BIOPSY  12/05/2022   Procedure: BIOPSY;  Surgeon: Wilhelmenia Aloha Raddle., MD;  Location: [facility];  Service: Gastroenterology;;   CATARACT EXTRACTION, BILATERAL     ESOPHAGOGASTRODUODENOSCOPY N/A 12/05/2022   Procedure: ESOPHAGOGASTRODUODENOSCOPY (EGD);  Surgeon: Wilhelmenia Aloha Raddle., MD;  Location: [facility];  Service: Gastroenterology;  Laterality: N/A;   EUS N/A 12/05/2022   Procedure: UPPER ENDOSCOPIC ULTRASOUND (EUS) RADIAL;  Surgeon: Wilhelmenia Aloha Raddle., MD;  Location: [facility];  Service: Gastroenterology;  Laterality: N/A;   FINE NEEDLE ASPIRATION N/A 12/05/2022   Procedure: FINE NEEDLE ASPIRATION (FNA) LINEAR;  Surgeon: Wilhelmenia Aloha Raddle., MD;  Location: [facility];  Service: Gastroenterology;  Laterality: N/A;   HERNIA REPAIR N/A    inguinal   NECK SURGERY     ant/post   SPINE  SURGERY     SUBMUCOSAL TATTOO INJECTION  12/05/2022   Procedure: SUBMUCOSAL TATTOO INJECTION;  Surgeon: Wilhelmenia Aloha Raddle., MD;  Location: [facility];  Service: Gastroenterology;;   TOTAL HIP ARTHROPLASTY Left 07/13/2023   Procedure: ARTHROPLASTY, HIP, TOTAL, ANTERIOR APPROACH;  Surgeon: Kendal Franky SQUIBB, MD;  Location: [facility];  Service: Orthopedics;  Laterality: Left;  [employer] bipolar Hana table  hemi hip   TRANSURETHRAL RESECTION OF PROSTATE     TURP VAPORIZATION     WHIPPLE PROCEDURE N/A 04/27/2023   Procedure: WHIPPLE PROCEDURE;  Surgeon: Aron Shoulders, MD;  Location: [facility] OR;  Service: General;  Laterality: N/A;    FAMILY HISTORY: Family History  Problem Relation Age of Onset   Lung cancer Mother    Heart disease Father    Hyperlipidemia Father  Diabetes Father        type 1   Diabetes Brother    Depression Brother    Renal cancer Brother    Anxiety disorder Brother    Stomach cancer Neg Hx    Colon cancer Neg Hx    Pancreatic cancer Neg Hx    Esophageal cancer Neg Hx    Rectal cancer Neg Hx     SOCIAL HISTORY: Social History   Socioeconomic History   Marital status: Married    Spouse name: [name] Speed   Number of children: 1   Years of education: Not on file   Highest education level: Bachelor's degree (e.g., BA, AB, BS)  Occupational History   Not on file  Tobacco Use   Smoking status: Former    Current packs/day: 0.00    Average packs/day: 1 pack/day for 5.0 years (5.0 ttl pk-yrs)    Types: Cigarettes    Start date: 03/02/1977    Quit date: 03/02/1982    Years since quitting: 41.7   Smokeless tobacco: Never   Tobacco comments:    quit 28 yrs ago  Vaping Use   Vaping status: Never Used  Substance and Sexual Activity   Alcohol use: Not Currently    Comment: none   Drug use: No   Sexual activity: Not Currently    Birth control/protection: Abstinence  Other Topics Concern   Not on file  Social History Narrative   Retired Risk analyst,  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   Social Drivers of Corporate investment banker Strain: Low Risk  (03/16/2023)   Overall Financial Resource Strain (CARDIA)    Difficulty of Paying Living Expenses: Not very hard  Food Insecurity: No Food Insecurity (07/13/2023)   Hunger Vital Sign    Worried About Running Out of Food in the Last Year: Never true    Ran Out of Food in the Last Year: Never true  Transportation Needs: No Transportation Needs (07/13/2023)   PRAPARE - Administrator, Civil Service (Medical): No    Lack of Transportation (Non-Medical): No  Physical Activity: Sufficiently Active (03/16/2023)   Exercise Vital Sign    Days of Exercise per Week: 5 days    Minutes of Exercise per Session: 40 min  Stress: No Stress Concern Present (03/16/2023)   [employer] of Occupational Health - Occupational Stress Questionnaire    Feeling of Stress : Only a little  Social Connections: Socially Integrated (07/13/2023)   Social Connection and Isolation Panel    Frequency of Communication with Friends and Family: More than three times a week    Frequency of Social Gatherings with Friends and Family: Once a week    Attends Religious Services: More than 4 times per year    Active Member of [employer] or Organizations: Yes    Attends Engineer, structural: More than 4 times per year    Marital Status: Married  Catering manager Violence: Not At Risk (07/13/2023)   Humiliation, Afraid, Rape, and Kick questionnaire    Fear of Current or Ex-Partner: No    Emotionally Abused: No    Physically Abused: No    Sexually Abused: No     PHYSICAL EXAM  GENERAL EXAM/CONSTITUTIONAL: Vitals:  Vitals:   11/13/23 1449  BP: 130/76  Pulse: 90  Weight: 152 lb (68.9 kg)  Height: 6' (1.829 m)   Body mass index is 20.61 kg/m. Wt Readings from Last 3 Encounters:  11/13/23 152 lb (68.9 kg)  10/31/23 152 lb 12.8 oz (69.3 kg)  10/23/23 148 lb 12.8 oz (67.5 kg)   Patient is in no  distress; well developed, nourished and groomed; neck is supple  CARDIOVASCULAR: Examination of carotid arteries is normal; no carotid bruits Regular rate and rhythm, no murmurs Examination of peripheral vascular system by observation and palpation is normal  EYES: Ophthalmoscopic exam of optic discs and posterior segments is normal; no papilledema or hemorrhages No results found.  MUSCULOSKELETAL: Gait, strength, tone, movements noted in Neurologic exam below  NEUROLOGIC: MENTAL STATUS:      No data to display            11/13/2023    2:50 PM  Montreal Cognitive Assessment   Visuospatial/ Executive (0/5) 3  Naming (0/3) 3  Attention: Read list of digits (0/2) 2  Attention: Read list of letters (0/1) 1  Attention: Serial 7 subtraction starting at 100 (0/3) 2  Language: Repeat phrase (0/2) 1  Language : Fluency (0/1) 1  Abstraction (0/2) 2  Delayed Recall (0/5) 3  Orientation (0/6) 6  Total 24   awake, alert, oriented to person, place and time recent and remote memory intact normal attention and concentration language fluent, comprehension intact, naming intact fund of knowledge appropriate  CRANIAL NERVE:  2nd, 3rd, 4th, 6th - pupils equal and reactive to light, visual fields full to confrontation, extraocular muscles intact, no nystagmus 5th - facial sensation symmetric 7th - facial strength symmetric 8th - hearing intact 9th - palate elevates symmetrically, uvula midline 11th - shoulder shrug symmetric 12th - tongue protrusion midline  MOTOR:  POSTURAL AND ACTION TREMOR IN BUE normal bulk and tone, full strength in the BUE, BLE; EXCEPT BILATERAL FOOT DROP WEAKNESS (LEFT 3, RIGHT 4)  SENSORY:  normal and symmetric to light touch, temperature, vibration; DECR AT TOES AND ANKLES  COORDINATION:  finger-nose-finger, fine finger movements normal  REFLEXES:  deep tendon reflexes TRACE and symmetric  GAIT/STATION:  narrow based gait; USING  WALKER     DIAGNOSTIC DATA (LABS, IMAGING, TESTING) - I reviewed patient records, labs, notes, testing and imaging myself where available.  Lab Results  Component Value Date   WBC 6.3 07/17/2023   HGB 9.6 (L) 07/17/2023   HCT 28.4 (L) 07/17/2023   MCV 85.8 07/17/2023   PLT 200 07/17/2023      Component Value Date/Time   NA 133 (L) 07/14/2023 0556   NA 134 01/20/2020 1053   K 4.6 07/14/2023 0556   CL 106 07/14/2023 0556   CO2 20 (L) 07/14/2023 0556   GLUCOSE 213 (H) 07/14/2023 0556   BUN 11 07/14/2023 0556   BUN 22 01/20/2020 1053   CREATININE 0.89 07/14/2023 0556   CREATININE 1.07 04/06/2021 0913   CALCIUM  9.0 07/14/2023 0556   PROT 5.8 (L) 06/06/2023 0645   PROT 6.3 02/10/2022 1157   ALBUMIN  2.4 (L) 06/06/2023 0645   ALBUMIN  4.4 01/20/2020 1053   AST 63 (H) 06/06/2023 0645   ALT 103 (H) 06/06/2023 0645   ALKPHOS 80 06/06/2023 0645   BILITOT 0.4 06/06/2023 0645   BILITOT 0.3 01/20/2020 1053   GFRNONAA >60 07/14/2023 0556   GFRNONAA >89 03/10/2014 1007   GFRAA 89 01/20/2020 1053   GFRAA >89 03/10/2014 1007   Lab Results  Component Value Date   CHOL 126 07/25/2022   HDL 38.40 (L) 07/25/2022   LDLCALC 66 07/25/2022   TRIG 112.0 07/25/2022   CHOLHDL 3 07/25/2022   Lab Results  Component Value Date  HGBA1C 6.3 (A) 10/23/2023   Lab Results  Component Value Date   VITAMINB12 465 07/25/2022   Lab Results  Component Value Date   TSH 1.17 07/25/2022   08/31/11 MRI cervical spine - Prior surgery as noted above with cervical spondylotic changes  extending from the C2-3 through the C7-T1 level with most notable  finding on the left at the C5-6 level as detailed above.  - Altered signal intensity within the cord at the C5 and C6 level may  represent gliosis from prior compression.   02/10/22 EMG/NCS - This is an abnormal study.  There is electrodiagnostic evidence of length-dependent sensorimotor neuropathy, with mixed axonal and demyelinating features.  This  can be seen in patient with long history of diabetes.  In addition there is evidence of chronic right C5-6-7 cervical radiculopathy; moderate right carpal tunnel syndrome.     ASSESSMENT AND PLAN  73 y.o. year old male here with:  Dx:  1. Weakness     PLAN:  COGNITIVE DIFFICULTY / DECONDITIONING / WEAKNESS (in setting of duodenal adenoma, pancreatic cystic tumors, s/p whipple surgery, protein calorie malnutrition, critical illness, left hip fracture, underlying peripheral neuropathy and prior cervical myelopathy) - continue PT, OT, ST eval and treatment - ongoing discussion with case manager and therapy to determine next steps to see if and when he can transition home - check B12, B1 levels; start multi-vitamin  Orders Placed This Encounter  Procedures   Vitamin B12   Vitamin B1   TSH Rfx on Abnormal to Free T4   Return for return to PCP, return to referring provider.    EDUARD FABIENE HANLON, MD 11/13/2023, 4:13 PM Certified in Neurology, Neurophysiology and Neuroimaging  [facility] [address] [place], [place] [address] [phone]

## 2023-11-14 NOTE — Therapy (Unsigned)
 OUTPATIENT OCCUPATIONAL THERAPY NEURO EVALUATION  Patient Name: Andrew Tanner MRN: [id number] DOB:August 01, 1950, 73 y.o., male Today's Date: 11/15/2023  PCP: Dr. Adaline  REFERRING PROVIDER: Dr. Adaline  END OF SESSION:  OT End of Session - 11/15/23 1120     Visit Number 1    Number of Visits 25    Date for OT Re-Evaluation 02/07/24    Authorization Type BCBS Medicare    Authorization - Visit Number 1    Progress Note Due on Visit 10    OT Start Time 1105    OT Stop Time 1145    OT Time Calculation (min) 40 min    Activity Tolerance Patient tolerated treatment well    Behavior During Therapy WFL for tasks assessed/performed          Past Medical History:  Diagnosis Date   Anxiety    Atrial myxoma    Basal cell carcinoma    Cancer (HCC)    basal cell skin CA   Cataract    Depression    Diabetes mellitus without complication (HCC)    Diabetic neuropathy (HCC)    Duodenal adenoma    Hyperlipidemia    Hypertension    IC (interstitial cystitis)    Neuromuscular disorder (HCC) 01/09/21   Peripheral  Neuropathy   Pancreatic lesion    Squamous cell carcinoma of skin    Past Surgical History:  Procedure Laterality Date   BIOPSY  12/05/2022   Procedure: BIOPSY;  Surgeon: Wilhelmenia Aloha Raddle., MD;  Location: [facility];  Service: Gastroenterology;;   CATARACT EXTRACTION, BILATERAL     ESOPHAGOGASTRODUODENOSCOPY N/A 12/05/2022   Procedure: ESOPHAGOGASTRODUODENOSCOPY (EGD);  Surgeon: Wilhelmenia Aloha Raddle., MD;  Location: [facility];  Service: Gastroenterology;  Laterality: N/A;   EUS N/A 12/05/2022   Procedure: UPPER ENDOSCOPIC ULTRASOUND (EUS) RADIAL;  Surgeon: Wilhelmenia Aloha Raddle., MD;  Location: [facility];  Service: Gastroenterology;  Laterality: N/A;   FINE NEEDLE ASPIRATION N/A 12/05/2022   Procedure: FINE NEEDLE ASPIRATION (FNA) LINEAR;  Surgeon: Wilhelmenia Aloha Raddle., MD;  Location: [facility];  Service: Gastroenterology;  Laterality: N/A;   HERNIA  REPAIR N/A    inguinal   NECK SURGERY     ant/post   SPINE SURGERY     SUBMUCOSAL TATTOO INJECTION  12/05/2022   Procedure: SUBMUCOSAL TATTOO INJECTION;  Surgeon: Wilhelmenia Aloha Raddle., MD;  Location: [facility];  Service: Gastroenterology;;   TOTAL HIP ARTHROPLASTY Left 07/13/2023   Procedure: ARTHROPLASTY, HIP, TOTAL, ANTERIOR APPROACH;  Surgeon: Kendal Franky SQUIBB, MD;  Location: [facility];  Service: Orthopedics;  Laterality: Left;  [employer] bipolar Hana table  hemi hip   TRANSURETHRAL RESECTION OF PROSTATE     TURP VAPORIZATION     WHIPPLE PROCEDURE N/A 04/27/2023   Procedure: WHIPPLE PROCEDURE;  Surgeon: Aron Shoulders, MD;  Location: [facility] OR;  Service: General;  Laterality: N/A;   Patient Active Problem List   Diagnosis Date Noted   Atrial myxoma 10/31/2023   History of left hip replacement 08/28/2023   Fracture of femoral neck, left (HCC) 07/13/2023   Protein-calorie malnutrition, severe 05/17/2023   Vomiting and diarrhea 05/13/2023   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   Malnutrition of moderate degree 05/08/2023   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   Poorly controlled type 2 diabetes mellitus with circulatory disorder (HCC) 02/08/2023   Vitamin D  deficiency 07/25/2022   Pain in both feet 02/10/2022   Peripheral neuropathy 01/25/2022   Seborrheic dermatitis 01/25/2022  Positive colorectal cancer screening using Cologuard test 03/16/2020   Chronic RLQ pain 03/16/2020   Type 2 diabetes mellitus with hyperglycemia, without long-term current use of insulin  (HCC) 06/02/2011   Hypertension 06/02/2011   Hyperlipemia 06/02/2011   Cataract 06/02/2011    ONSET DATE: referral 10/02/23  REFERRING DIAG: Generalized weakness, Idiopathy peripheral neuropathy  L hip replacement   THERAPY DIAG:  Muscle weakness (generalized) - Plan: Ot plan of care cert/re-cert  Other lack of coordination - Plan: Ot plan of care  cert/re-cert  Frontal lobe and executive function deficit - Plan: Ot plan of care cert/re-cert  Attention and concentration deficit - Plan: Ot plan of care cert/re-cert  Other abnormalities of gait and mobility - Plan: Ot plan of care cert/re-cert  Rationale for Evaluation and Treatment: Rehabilitation  SUBJECTIVE:   SUBJECTIVE STATEMENT: 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 male admitted via EMS 07/12/23 for fall at rehab facility. X-ray showed L femoral neck fx. L THA performed 4/3. 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 Yes: NPRS scale: 3/10 Pain location: LLE Pain description: aching Aggravating factors: malpositioning Relieving factors: repositioning  FALLS: Has patient fallen in last 6 months? Yes. Number of falls multiple  LIVING ENVIRONMENT: Lives with: lives with their family and lives in an adult home, pt is staying in a home with personal care assistance since he can't climb stairs at home Lives in: House/apartment Stairs: No Has following equipment at home: Walker - 4 wheeled and shower chair  PLOF: Independent  PATIENT GOALS: improve independence, move back home  OBJECTIVE:  Note: Objective measures were completed at Evaluation unless otherwise noted.  HAND DOMINANCE: Right  ADLs:has assist for starting shower, pt performs  with supervision Overall ADLs: increased time required Transfers/ambulation related to ADLs: Eating: mod I  UB Dressing: supervision LB Dressing: supervision Toileting: mod I with walker Bathing: supervision Tub Shower transfers: has tub seat, min A Equipment: Shower seat with  back  IADLs:Dependent for IADLs  Handwriting: 100% legible  MOBILITY STATUS: supervision     ACTIVITY TOLERANCE: Activity tolerance: decreased overall endurance   UPPER EXTREMITY ROM:  A/ROM WFLs    UPPER EXTREMITY MMT:     MMT Right eval Left eval  Shoulder flexion 4-/5 4-/5  Shoulder abduction    Shoulder adduction    Shoulder extension    Shoulder internal rotation    Shoulder external rotation    Middle trapezius    Lower trapezius    Elbow flexion 4/5 4/5  Elbow extension 4/5 4/5  Wrist flexion    Wrist extension    Wrist ulnar deviation    Wrist radial deviation    Wrist pronation    Wrist supination    (Blank rows = not tested)  HAND FUNCTION: Grip strength: Right: 75 lbs; Left: 70 lbs  COORDINATION: 9 Hole Peg test: Right: 36.84 sec; Left: 35.46 sec, tremors and increased difficulty  SENSATION: Not tested    COGNITION: Overall cognitive status: decreased short term memory, correctly completes trail making A, unable to correctly complete trail making B- decreased alternating attention    VISION ASSESSMENT:NT, wears glasses Pt  was noted to bump into door frame x 2 with walker, vision to  be further assessed within a functional context.     OBSERVATIONS: 11/15/23-Plesant gentleman with complex medical history accompanied by his wife. Pt is and artist.Pt is living in an adult care home since he can't navigate the stairs at home.                                                                                                                             TREATMENT DATE: 11/15/23- eval         PATIENT EDUCATION: Education details: role of OT, potential goals Person educated: Patient and Spouse Education method: Explanation Education comprehension: verbalized understanding  HOME EXERCISE PROGRAM: n/a   GOALS: Goals reviewed with patient? Yes  SHORT TERM GOALS: Target date: 12/16/23  I with inital HEP.  Goal status: INITIAL  2.  Pt  will verbalize understanding of memory compensations.  Goal status: INITIAL  3.  Pt will perform an alternating attention task with 80% accuracy for increased ease with IADLs.  Goal status: INITIAL  4.  Pt will perfrom dynamic activities in standing x 10 mins without LOB for increaed ease with ADLs/IADLs.  Goal status: INITIAL  5.  Pt will perform an organization/ scheduling task with 90% or better accuracy (plan meunu/ grocery list, organizing your day task)  Goal status: INITIAL   LONG TERM GOALS: Target date: 02/07/24  I with updated HEP. Baseline:  Goal status: INITIAL  2.  Pt will perform simple snack or sandwich prep mod I.  Goal status: INITIAL  3.  Pt will perform simple IADL tasks such as folding laundry and washing dishes swith supervision.  Goal status: INITIAL  4.  Pt will navigate a busy environment without bumping into items and will locate items with 90% or better accuracy. Baseline:  Goal status: INITIAL  5.  Pt will demonstrate improved alternating attention as evidenced by completing trailmaking B correctly in 3 mins or less.  Goal status: INITIAL    ASSESSMENT:  CLINICAL IMPRESSION: Pt is a 73 y.o. male hospitalized  via EMS 07/12/23 for fall at rehab facility. X-ray showed L femoral neck fx. L THA performed 4/3. 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 Pt presents with the perfromance deficits below. He can benefit from skilled occupational therapy to address these deficits in order to maximize pt's safety and I with ADLs/IADLs. PERFORMANCE DEFICITS: in functional skills including ADLs, IADLs, coordination, dexterity, strength, pain, flexibility, Fine motor control, Gross motor control, mobility, balance, endurance, decreased knowledge of precautions, decreased knowledge of use of DME, vision, and UE  functional use, cognitive skills including attention, memory, problem solving, safety awareness, sequencing, and temperament/personality, and psychosocial skills including coping strategies, environmental adaptation, habits, interpersonal interactions, and routines and behaviors.   IMPAIRMENTS: are limiting patient from ADLs, IADLs, work, play, leisure, and social participation.   CO-MORBIDITIES: may have co-morbidities  that affects occupational performance. Patient will  benefit from skilled OT to address above impairments and improve overall function.  MODIFICATION OR ASSISTANCE TO COMPLETE EVALUATION: No modification of tasks or assist necessary to complete an evaluation.  OT OCCUPATIONAL PROFILE AND HISTORY: Detailed assessment: Review of records and additional review of physical, cognitive, psychosocial history related to current functional performance.  CLINICAL DECISION MAKING: LOW - limited treatment options, no task modification necessary  REHAB POTENTIAL: Good  EVALUATION COMPLEXITY: Low    PLAN:  OT FREQUENCY: 1-2x/week  OT DURATION: 12 weeks  PLANNED INTERVENTIONS: 97168 OT Re-evaluation, 97535 self care/ADL training, 02889 therapeutic exercise, 97530 therapeutic activity, 97112 neuromuscular re-education, 97140 manual therapy, 97035 ultrasound, 97018 paraffin, 02989 moist heat, 97010 cryotherapy, 97129 Cognitive training (first 15 min), 02869 Cognitive training(each additional 15 min), balance training, functional mobility training, visual/perceptual remediation/compensation, psychosocial skills training, energy conservation, coping strategies training, patient/family education, and DME and/or AE instructions  RECOMMENDED OTHER SERVICES: PT, ST  CONSULTED AND AGREED WITH PLAN OF CARE: Patient and family member/caregiver  PLAN FOR NEXT SESSION: inital HEP for coordination, UE strength, consider memory compensations   Sydnie Sigmund, OT 11/15/2023, 1:56  PM

## 2023-11-15 ENCOUNTER — Other Ambulatory Visit: Payer: Self-pay

## 2023-11-15 ENCOUNTER — Ambulatory Visit: Attending: Family Medicine | Admitting: Occupational Therapy

## 2023-11-15 DIAGNOSIS — R41840 Attention and concentration deficit: Secondary | ICD-10-CM | POA: Diagnosis not present

## 2023-11-15 DIAGNOSIS — M6281 Muscle weakness (generalized): Secondary | ICD-10-CM | POA: Diagnosis not present

## 2023-11-15 DIAGNOSIS — R2689 Other abnormalities of gait and mobility: Secondary | ICD-10-CM | POA: Diagnosis not present

## 2023-11-15 DIAGNOSIS — R41844 Frontal lobe and executive function deficit: Secondary | ICD-10-CM | POA: Diagnosis not present

## 2023-11-15 DIAGNOSIS — R296 Repeated falls: Secondary | ICD-10-CM | POA: Insufficient documentation

## 2023-11-15 DIAGNOSIS — R41841 Cognitive communication deficit: Secondary | ICD-10-CM | POA: Diagnosis not present

## 2023-11-15 DIAGNOSIS — R278 Other lack of coordination: Secondary | ICD-10-CM | POA: Insufficient documentation

## 2023-11-16 ENCOUNTER — Ambulatory Visit: Payer: Self-pay | Admitting: Diagnostic Neuroimaging

## 2023-11-16 LAB — TSH RFX ON ABNORMAL TO FREE T4: TSH: 1.2 u[IU]/mL (ref 0.450–4.500)

## 2023-11-16 LAB — VITAMIN B1: Thiamine: 136.2 nmol/L (ref 66.5–200.0)

## 2023-11-16 LAB — VITAMIN B12: Vitamin B-12: 597 pg/mL (ref 232–1245)

## 2023-11-19 DIAGNOSIS — E1140 Type 2 diabetes mellitus with diabetic neuropathy, unspecified: Secondary | ICD-10-CM | POA: Diagnosis not present

## 2023-11-19 DIAGNOSIS — S72002A Fracture of unspecified part of neck of left femur, initial encounter for closed fracture: Secondary | ICD-10-CM | POA: Diagnosis not present

## 2023-11-19 DIAGNOSIS — C7A8 Other malignant neuroendocrine tumors: Secondary | ICD-10-CM | POA: Diagnosis not present

## 2023-11-19 DIAGNOSIS — G709 Myoneural disorder, unspecified: Secondary | ICD-10-CM | POA: Diagnosis not present

## 2023-11-20 NOTE — Therapy (Signed)
 OUTPATIENT PHYSICAL THERAPY LOWER EXTREMITY EVALUATION   Patient Name: Andrew Tanner MRN: [id number] DOB:1950/11/23, 73 y.o., male Today's Date: 11/21/2023  END OF SESSION:  PT End of Session - 11/21/23 1314     Visit Number 1    Date for PT Re-Evaluation 02/13/24    Authorization Type BCBS Medicare    PT Start Time 1315    PT Stop Time 1400    PT Time Calculation (min) 45 min          Past Medical History:  Diagnosis Date   Anxiety    Atrial myxoma    Basal cell carcinoma    Cancer (HCC)    basal cell skin CA   Cataract    Depression    Diabetes mellitus without complication (HCC)    Diabetic neuropathy (HCC)    Duodenal adenoma    Hyperlipidemia    Hypertension    IC (interstitial cystitis)    Neuromuscular disorder (HCC) 01/09/21   Peripheral  Neuropathy   Pancreatic lesion    Squamous cell carcinoma of skin    Past Surgical History:  Procedure Laterality Date   BIOPSY  12/05/2022   Procedure: BIOPSY;  Surgeon: Wilhelmenia Aloha Raddle., MD;  Location: [facility];  Service: Gastroenterology;;   CATARACT EXTRACTION, BILATERAL     ESOPHAGOGASTRODUODENOSCOPY N/A 12/05/2022   Procedure: ESOPHAGOGASTRODUODENOSCOPY (EGD);  Surgeon: Wilhelmenia Aloha Raddle., MD;  Location: [facility];  Service: Gastroenterology;  Laterality: N/A;   EUS N/A 12/05/2022   Procedure: UPPER ENDOSCOPIC ULTRASOUND (EUS) RADIAL;  Surgeon: Wilhelmenia Aloha Raddle., MD;  Location: [facility];  Service: Gastroenterology;  Laterality: N/A;   FINE NEEDLE ASPIRATION N/A 12/05/2022   Procedure: FINE NEEDLE ASPIRATION (FNA) LINEAR;  Surgeon: Wilhelmenia Aloha Raddle., MD;  Location: [facility];  Service: Gastroenterology;  Laterality: N/A;   HERNIA REPAIR N/A    inguinal   NECK SURGERY     ant/post   SPINE SURGERY     SUBMUCOSAL TATTOO INJECTION  12/05/2022   Procedure: SUBMUCOSAL TATTOO INJECTION;  Surgeon: Wilhelmenia Aloha Raddle., MD;  Location: [facility];  Service: Gastroenterology;;    TOTAL HIP ARTHROPLASTY Left 07/13/2023   Procedure: ARTHROPLASTY, HIP, TOTAL, ANTERIOR APPROACH;  Surgeon: Kendal Franky SQUIBB, MD;  Location: [facility];  Service: Orthopedics;  Laterality: Left;  [employer] bipolar Hana table  hemi hip   TRANSURETHRAL RESECTION OF PROSTATE     TURP VAPORIZATION     WHIPPLE PROCEDURE N/A 04/27/2023   Procedure: WHIPPLE PROCEDURE;  Surgeon: Aron Shoulders, MD;  Location: [facility] OR;  Service: General;  Laterality: N/A;   Patient Active Problem List   Diagnosis Date Noted   Atrial myxoma 10/31/2023   History of left hip replacement 08/28/2023   Fracture of femoral neck, left (HCC) 07/13/2023   Protein-calorie malnutrition, severe 05/17/2023   Vomiting and diarrhea 05/13/2023   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   Malnutrition of moderate degree 05/08/2023   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   Poorly controlled type 2 diabetes mellitus with circulatory disorder (HCC) 02/08/2023   Vitamin D  deficiency 07/25/2022   Pain in both feet 02/10/2022   Peripheral neuropathy 01/25/2022   Seborrheic dermatitis 01/25/2022   Positive colorectal cancer screening using Cologuard test 03/16/2020   Chronic RLQ pain 03/16/2020   Type 2 diabetes mellitus with hyperglycemia, without long-term current use of insulin  (HCC) 06/02/2011   Hypertension 06/02/2011   Hyperlipemia 06/02/2011   Cataract 06/02/2011    PCP: Jenkins Earnie Carrel  REFERRING PROVIDER: Jenkins Earnie Carrel  REFERRING DIAG:  [phone] (ICD-10-CM) - History of left hip replacement  E44.0 (ICD-10-CM) - Malnutrition of moderate degree (HCC)  G60.9 (ICD-10-CM) - Idiopathic peripheral neuropathy  R53.1 (ICD-10-CM) - Generalized weakness    THERAPY DIAG:  Muscle weakness (generalized)  Other lack of coordination  Other abnormalities of gait and mobility  Repeated falls  Rationale for Evaluation and Treatment: Rehabilitation  ONSET DATE:  07/12/23  SUBJECTIVE:   SUBJECTIVE STATEMENT: I use the walker when I go out but in the house. Wife reports he has a cane but no body has taught him how to use it. Also we need to relearn how to get in and out of car. I walk around without anything at the house sometimes. I had a hip replacement in April because I fell but it feels good now. I can do everything on my own.   PERTINENT HISTORY: 11/13/23--73 year old male with history of diabetes, neuropathy, hypertension, hyperlipidemia here for evaluation of memory, cognitive changes and generalized weakness.   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  He was admitted to the hospital for surgical treatment with Whipple procedure.  He was discharged home with couple weeks later.  He was readmitted on 05/13/2023 after increased diarrhea, weakness and falling down at home.  He was admitted for approximately 4 weeks with ongoing vomiting, diarrhea, protein calorie malnutrition, colitis and muscular deconditioning.  This time he was discharged to skilled nursing facility.   06/26/2023 patient fell down at nursing home, hit his head, and went to the emergency room.   07/12/2023 patient fell down again resulting in the left femoral neck fracture requiring admission and surgery. Overall patient has had significant difficulty returning to his baseline.  Wife notes that he is having some ongoing short-term memory difficulty, decreased insight, brain fog issues.  Also continues to be generally deconditioned and weak.  They live in a two-story home with the bedroom and bathroom upstairs, 14 steps, and this remains a challenge for patient to transition from skilled nursing facility back home.   Pt is a 73 y.o. male admitted via EMS 07/12/23 for fall at rehab facility. X-ray showed L femoral neck fx. L THA performed 4/3. 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 Yes: NPRS scale: at worst 9/10 Pain location: feet, my hip sometimes hurts Pain description: stinging, cold sensation Aggravating factors: worse at end of day Relieving factors: meds, lying down, exercise   PRECAUTIONS: Fall  RED FLAGS: None   WEIGHT BEARING RESTRICTIONS: No  FALLS:  Has patient fallen in last 6 months? Yes. Number of falls no falls in the last 3 months, but at least 12 in the last 6 months   LIVING ENVIRONMENT: Lives with: lives with their spouse Lives in: House/apartment Stairs: Yes: Internal: 14 steps; on right going up and on left going up and External: 2 in garage and 2 to front door steps; can reach both and bilateral but cannot reach both Has following equipment at home: Single point cane and Walker - 2 wheeled  **currently living at a Assisted Living for now**  OCCUPATION: An Tree surgeon   PLOF: Independent, Independent with basic ADLs, and Independent with gait  PATIENT GOALS: to get more independent, get back to my home  NEXT MD VISIT:   OBJECTIVE:  Note:  Objective measures were completed at Evaluation unless otherwise noted.  DIAGNOSTIC FINDINGS: Left hip arthroplasty in expected alignment. No periprosthetic lucency or fracture. Recent postsurgical change includes air and edema in the soft tissues. Left hip arthroplasty without immediate postoperative complication.   COGNITION: Overall cognitive status: Within functional limits for tasks assessed     SENSATION: Neuropathy in B feet    POSTURE: rounded shoulders and forward head   LOWER EXTREMITY ROM: WNL   LOWER EXTREMITY MMT: 4+/5 BLE    FUNCTIONAL TESTS:  5 times sit to stand: 19s  Timed up and go (TUG): 22s  GAIT: Distance walked: in clinic distances Assistive device utilized: Environmental consultant - 2 wheeled and None Level of assistance: Complete Independence and  Modified independence Comments: able to complete TUG without walker, slow but is safe                                                                                                                        TREATMENT DATE: 11/21/23 EVAL, POC    PATIENT EDUCATION:  Education details: POC, HEP, fall risk Person educated: Patient and Spouse Education method: Medical illustrator Education comprehension: verbalized understanding and returned demonstration  HOME EXERCISE PROGRAM: Access Code: [email or web address] URL: [email or web address] Date: 11/21/2023 Prepared by: Almetta Fam  Exercises - Sit to Stand  - 1 x daily - 7 x weekly - 2 sets - 10 reps - Seated Long Arc Quad  - 1 x daily - 7 x weekly - 2 sets - 10 reps - Standing Hip Abduction with Counter Support  - 1 x daily - 7 x weekly - 2 sets - 10 reps - Standing Hip Extension with Counter Support  - 1 x daily - 7 x weekly - 2 sets - 10 reps - Heel Raises with Counter Support  - 1 x daily - 7 x weekly - 2 sets - 10 reps  ASSESSMENT:  CLINICAL IMPRESSION: Patient is a 73 y.o. male who was seen today for physical therapy evaluation and treatment for weakness. He has an extensive medical history and multiple surgeries that has caused him to lose a lot of weight and muscle mass. His wife reports a number of falls since the beginning of the year but none in the past 3 months. Currently he is staying at an assisted living to work on ADLs and increasing his independence. He walks with a walker outside of his home. His wife reports he has a cane, but needs practice on how to use it. Pt reports he does some walking around the house without it. At their home, he has stairs up to the bedroom and bathrooms so would like to practice working on this. He does have neuropathy and reports high levels of pain at times in both feet. Patient will benefit from PT to increase his strength and allow him to improve functional independence and mobility.    OBJECTIVE IMPAIRMENTS: Abnormal gait, decreased activity tolerance, decreased balance, decreased coordination, decreased endurance, difficulty walking,  decreased strength, decreased safety awareness, and pain.   ACTIVITY LIMITATIONS: carrying, lifting, squatting, stairs, transfers, and locomotion level  PARTICIPATION LIMITATIONS: meal prep, cleaning, laundry, driving, shopping, community activity, and yard work  PERSONAL FACTORS: Fitness, Past/current experiences, Time since onset of injury/illness/exacerbation, and 3+ comorbidities: istory of diabetes, neuropathy, hypertension, hyperlipidemia here for evaluation of memory, cognitive changes and generalized weakness are also affecting patient's functional outcome.   REHAB POTENTIAL: Good  CLINICAL DECISION MAKING: Stable/uncomplicated  EVALUATION COMPLEXITY: Low  GOALS: Goals reviewed with patient? Yes  SHORT TERM GOALS: Target date: 01/02/24  Patient will be independent with initial HEP. Baseline:  Goal status: INITIAL  2.  Patient will demonstrate improved functional LE strength as demonstrated by 5xSTS <15s. Baseline: 19s Goal status: INITIAL  3.  Patient will be educated on strategies to decrease risk of falls.  Baseline:  Goal status: INITIAL   LONG TERM GOALS: Target date: 02/13/24  Patient will be independent with advanced/ongoing HEP to improve outcomes and carryover.  Baseline:  Goal status: INITIAL  2.  Patient will be able to ambulate 500' without AD with good safety to access community.  Baseline: short distances without AD Goal status: INITIAL  3.  Patient will be able to navigate a set of stairs with reciprocal pattern for safety inside his home.  Baseline:  Goal status: INITIAL   4.  Patient will demonstrate decreased fall risk by scoring < 14 sec on TUG. Baseline: 22s without walker Goal status: INITIAL  PLAN:  PT FREQUENCY: 2x/week  PT DURATION: 12 weeks  PLANNED INTERVENTIONS:  97110-Therapeutic exercises, 97530- Therapeutic activity, 97112- Neuromuscular re-education, 97535- Self Care, 02859- Manual therapy, [phone]- Gait training, [phone]- Electrical stimulation (unattended), 97016- Vasopneumatic device, 20560 (1-2 muscles), 20561 (3+ muscles)- Dry Needling, Patient/Family education, Balance training, Stair training, Joint mobilization, Joint manipulation, Spinal manipulation, Spinal mobilization, Cryotherapy, and Moist heat  PLAN FOR NEXT SESSION: LE strengthening, gait training with cane, functional tasks   [employer], PT 11/21/2023, 2:09 PM

## 2023-11-21 ENCOUNTER — Encounter: Payer: Self-pay | Admitting: Physical Medicine & Rehabilitation

## 2023-11-21 ENCOUNTER — Ambulatory Visit: Admitting: Podiatry

## 2023-11-21 ENCOUNTER — Ambulatory Visit: Admitting: Speech Pathology

## 2023-11-21 ENCOUNTER — Encounter: Payer: Self-pay | Admitting: Speech Pathology

## 2023-11-21 ENCOUNTER — Ambulatory Visit

## 2023-11-21 DIAGNOSIS — R41844 Frontal lobe and executive function deficit: Secondary | ICD-10-CM | POA: Diagnosis not present

## 2023-11-21 DIAGNOSIS — R41840 Attention and concentration deficit: Secondary | ICD-10-CM | POA: Diagnosis not present

## 2023-11-21 DIAGNOSIS — M6281 Muscle weakness (generalized): Secondary | ICD-10-CM

## 2023-11-21 DIAGNOSIS — R41841 Cognitive communication deficit: Secondary | ICD-10-CM | POA: Diagnosis not present

## 2023-11-21 DIAGNOSIS — R278 Other lack of coordination: Secondary | ICD-10-CM

## 2023-11-21 DIAGNOSIS — R296 Repeated falls: Secondary | ICD-10-CM | POA: Diagnosis not present

## 2023-11-21 DIAGNOSIS — R2689 Other abnormalities of gait and mobility: Secondary | ICD-10-CM

## 2023-11-21 NOTE — Therapy (Signed)
 OUTPATIENT SPEECH LANGUAGE PATHOLOGY EVALUATION   Patient Name: Andrew Tanner MRN: [id number] DOB:Jan 30, 1951, 73 y.o., male Today's Date: 11/21/2023  PCP: Wendolyn Jenkins Jansky, MD  REFERRING PROVIDER: Wendolyn Jenkins Jansky, MD  END OF SESSION:  End of Session - 11/21/23 1233     Visit Number 1    Number of Visits 9    Date for SLP Re-Evaluation 01/21/24    SLP Start Time 1230    SLP Stop Time  1310    SLP Time Calculation (min) 40 min    Activity Tolerance Patient tolerated treatment well          Past Medical History:  Diagnosis Date   Anxiety    Atrial myxoma    Basal cell carcinoma    Cancer (HCC)    basal cell skin CA   Cataract    Depression    Diabetes mellitus without complication (HCC)    Diabetic neuropathy (HCC)    Duodenal adenoma    Hyperlipidemia    Hypertension    IC (interstitial cystitis)    Neuromuscular disorder (HCC) 01/09/21   Peripheral  Neuropathy   Pancreatic lesion    Squamous cell carcinoma of skin    Past Surgical History:  Procedure Laterality Date   BIOPSY  12/05/2022   Procedure: BIOPSY;  Surgeon: Wilhelmenia Aloha Raddle., MD;  Location: [facility];  Service: Gastroenterology;;   CATARACT EXTRACTION, BILATERAL     ESOPHAGOGASTRODUODENOSCOPY N/A 12/05/2022   Procedure: ESOPHAGOGASTRODUODENOSCOPY (EGD);  Surgeon: Wilhelmenia Aloha Raddle., MD;  Location: [facility];  Service: Gastroenterology;  Laterality: N/A;   EUS N/A 12/05/2022   Procedure: UPPER ENDOSCOPIC ULTRASOUND (EUS) RADIAL;  Surgeon: Wilhelmenia Aloha Raddle., MD;  Location: [facility];  Service: Gastroenterology;  Laterality: N/A;   FINE NEEDLE ASPIRATION N/A 12/05/2022   Procedure: FINE NEEDLE ASPIRATION (FNA) LINEAR;  Surgeon: Wilhelmenia Aloha Raddle., MD;  Location: [facility];  Service: Gastroenterology;  Laterality: N/A;   HERNIA REPAIR N/A    inguinal   NECK SURGERY     ant/post   SPINE SURGERY     SUBMUCOSAL TATTOO INJECTION  12/05/2022   Procedure: SUBMUCOSAL TATTOO  INJECTION;  Surgeon: Wilhelmenia Aloha Raddle., MD;  Location: [facility];  Service: Gastroenterology;;   TOTAL HIP ARTHROPLASTY Left 07/13/2023   Procedure: ARTHROPLASTY, HIP, TOTAL, ANTERIOR APPROACH;  Surgeon: Kendal Franky SQUIBB, MD;  Location: [facility];  Service: Orthopedics;  Laterality: Left;  [employer] bipolar Hana table  hemi hip   TRANSURETHRAL RESECTION OF PROSTATE     TURP VAPORIZATION     WHIPPLE PROCEDURE N/A 04/27/2023   Procedure: WHIPPLE PROCEDURE;  Surgeon: Aron Shoulders, MD;  Location: [facility] OR;  Service: General;  Laterality: N/A;   Patient Active Problem List   Diagnosis Date Noted   Atrial myxoma 10/31/2023   History of left hip replacement 08/28/2023   Fracture of femoral neck, left (HCC) 07/13/2023   Protein-calorie malnutrition, severe 05/17/2023   Vomiting and diarrhea 05/13/2023   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   Malnutrition of moderate degree 05/08/2023   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   Poorly controlled type 2 diabetes mellitus with circulatory disorder (HCC) 02/08/2023   Vitamin D  deficiency 07/25/2022   Pain in both feet 02/10/2022   Peripheral neuropathy 01/25/2022   Seborrheic dermatitis 01/25/2022   Positive colorectal cancer screening using Cologuard test 03/16/2020   Chronic RLQ pain 03/16/2020   Type 2 diabetes mellitus with hyperglycemia, without long-term current use of insulin  (HCC)  06/02/2011   Hypertension 06/02/2011   Hyperlipemia 06/02/2011   Cataract 06/02/2011    ONSET DATE: Referred on  11/02/23  REFERRING DIAG: None provided; reportedly here for brain fog 2/2 anesthesia post surgeries  THERAPY DIAG:  Cognitive communication deficit  Rationale for Evaluation and Treatment: Rehabilitation  SUBJECTIVE:   SUBJECTIVE STATEMENT: pt was pleasant and cooperative throughout assessment  Pt accompanied by: self and significant other; [name]  PERTINENT HISTORY: Per chart  review: 73 year old male with history of diabetes, neuropathy, hypertension, hyperlipidemia here for evaluation of memory, cognitive changes and generalized weakness.   PAIN:  Are you having pain? No; neuropathy  FALLS: Has patient fallen in last 6 months?  Yes, Number of falls: 4   LIVING ENVIRONMENT: Lives with: lives in an adult home; [facility] Lives in: Group home; hoping to return home   *under Long term care   PLOF:  Level of assistance: Needed assistance with ADLs, Needed assistance with IADLS; two surgeries (whipple 04/2023 and hip 07/2023)  Employment: Retired; full Control and instrumentation engineer   PATIENT GOALS: to return back home  OBJECTIVE:  Note: Objective measures were completed at Evaluation unless otherwise noted.  DIAGNOSTIC FINDINGS: Per EMR  CT Head Wo Contrast  IMPRESSION: No CT evidence of acute intracranial abnormality.   Small right frontal scalp hematoma.   Unchanged pituitary/sellar mass.   No acute fracture or traumatic malalignment in the cervical spine.   Degenerative changes and postsurgical changes as above.   Similar appearance of left thyroid  nodule. Reiterate recommendation for thyroid  ultrasound if not previously performed.     Electronically Signed   By: Donnice Mania M.D.   On: 06/26/2023 09:33  COGNITION: Overall cognitive status: Impaired Areas of impairment:  Attention: Impaired: Selective, Alternating, Divided Memory: Impaired: Working Teacher, music term Prospective Auditory Awareness: Impaired: Intellectual Executive function: Impaired: Problem solving, Organization, Planning, Error awareness, Self-correction, and Slow processing Functional deficits: Poor safety awareness; aware of some deficits, but not of all. Wife reports more difficulty than pt shares.   COGNITIVE COMMUNICATION: Following directions: Follows multi-step commands inconsistently  Auditory comprehension: Impaired: 2/2 to attention/working memory Verbal expression:  WFL Functional communication: Impaired: see clinical impression  ORAL MOTOR EXAMINATION: Overall status: Did not assess Comments: NA  STANDARDIZED ASSESSMENTS:   Cognitive Linguistic Quick Test: AGE - 18 - 69   The Cognitive Linguistic Quick Test (CLQT) was administered to assess the relative status of five cognitive domains: attention, memory, language, executive functioning, and visuospatial skills. Scores from 10 tasks were used to estimate severity ratings (standardized for age groups 18-69 years and 70-89 years) for each domain, a clock drawing task, as well as an overall composite severity rating of cognition.       Task Score Criterion Cut Scores  Personal Facts 8/8 8  Symbol Cancellation 11/12 11  Confrontation Naming -/10 10  Clock Drawing  12/13 12  Story Retelling 5/10 6  Symbol Trails 6/10 9  Generative Naming 4/9 5  Design Memory -/6 5  Mazes  4/8 7  Design Generation -/13 6     PATIENT REPORTED OUTCOME MEASURES (PROM): To complete in subsequent sessions (wife and pt due to decreased awareness.  TREATMENT DATE:     PATIENT EDUCATION: Education details: SLP role in cognitive-communication Person educated: Patient and Spouse Education method: Explanation Education comprehension: verbalized understanding and needs further education   GOALS: Goals reviewed with patient? No  SHORT TERM GOALS: Target date: 12/23/23  Complete CLQT, PROM, and update goals Baseline: Goal status: INITIAL  2.  Pt will identify the unsafe situation and provide 1 solution  Baseline:  Goal status: INITIAL  3.   Baseline:  Goal status: INITIAL  4.   Baseline:  Goal status: INITIAL  5.   Baseline:  Goal status: INITIAL  6.   Baseline:  Goal status: INITIAL  LONG TERM GOALS: Target date: 01/22/24  Pt/wife will improve score on PROM Baseline:   Goal status: INITIAL  2.  Pt will demonstrate improved safety awareness by acknowledging cognitive impairments Baseline:  Goal status: INITIAL  3.   Baseline:  Goal status: INITIAL  4.   Baseline:  Goal status: INITIAL  5.   Baseline:  Goal status: INITIAL  6.   Baseline:  Goal status: INITIAL  ASSESSMENT:  CLINICAL IMPRESSION: Pt is a 73 yo male who presents to ST OP for evaluation with complaints of cognitive changes post surgery. Pt was recently discharged from [facility] in June. Pt and wife endorse re: forgetfulness, difficulty retaining information, difficulty with prospective memory, and safety/judgement. Pt was assessed using CLQT - to complete next session. SLP observed reduced topic maintenance and perseveration on his art. For example, SLP asked what he feels like he struggles with at home and pt never answered the question, but expressed his interest in [employer]. Pt appeared to have decreased insight. It was suggested that wife attention sessions to assist with carryover of strategy. SLP rec skilled ST services to address cognitive-communication impairment to facilitate return to home. .     OBJECTIVE IMPAIRMENTS: include attention, memory, awareness, and executive functioning. These impairments are limiting patient from managing medications, managing appointments, managing finances, household responsibilities, ADLs/IADLs, and effectively communicating at home and in community. Factors affecting potential to achieve goals and functional outcome are ability to learn/carryover information.. Patient will benefit from skilled SLP services to address above impairments and improve overall function.  REHAB POTENTIAL: Good with wife support  PLAN:  SLP FREQUENCY: 1-2x/week  SLP DURATION: 8 weeks  PLANNED INTERVENTIONS: Environmental controls, Cueing hierachy, Cognitive reorganization, Internal/external aids, Functional tasks, Multimodal communication approach, SLP instruction  and feedback, Compensatory strategies, Patient/family education, and 07492 Treatment of speech (30 or 45 min)     [employer], CCC-SLP 11/21/2023, 12:33 PM

## 2023-11-29 ENCOUNTER — Encounter: Payer: Self-pay | Admitting: Speech Pathology

## 2023-11-29 ENCOUNTER — Ambulatory Visit: Admitting: Speech Pathology

## 2023-11-29 DIAGNOSIS — R41841 Cognitive communication deficit: Secondary | ICD-10-CM | POA: Diagnosis not present

## 2023-11-29 DIAGNOSIS — R278 Other lack of coordination: Secondary | ICD-10-CM | POA: Diagnosis not present

## 2023-11-29 DIAGNOSIS — M6281 Muscle weakness (generalized): Secondary | ICD-10-CM | POA: Diagnosis not present

## 2023-11-29 DIAGNOSIS — R2689 Other abnormalities of gait and mobility: Secondary | ICD-10-CM | POA: Diagnosis not present

## 2023-11-29 DIAGNOSIS — R41840 Attention and concentration deficit: Secondary | ICD-10-CM | POA: Diagnosis not present

## 2023-11-29 DIAGNOSIS — R296 Repeated falls: Secondary | ICD-10-CM | POA: Diagnosis not present

## 2023-11-29 DIAGNOSIS — R41844 Frontal lobe and executive function deficit: Secondary | ICD-10-CM | POA: Diagnosis not present

## 2023-11-29 NOTE — Therapy (Unsigned)
 OUTPATIENT SPEECH LANGUAGE PATHOLOGY EVALUATION   Patient Name: Tara Wich MRN: [id number] DOB:[birth year], 73 y.o., male Today's Date: 11/29/2023  PCP: Wendolyn Jenkins Jansky, MD  REFERRING PROVIDER: Wendolyn Jenkins Jansky, MD  END OF SESSION:  End of Session - 11/29/23 1108     Visit Number 2    Number of Visits 12    Date for SLP Re-Evaluation 01/21/24    SLP Start Time 1100    SLP Stop Time  1145    SLP Time Calculation (min) 45 min    Activity Tolerance Patient tolerated treatment well          Past Medical History:  Diagnosis Date   Anxiety    Atrial myxoma    Basal cell carcinoma    Cancer (HCC)    basal cell skin CA   Cataract    Depression    Diabetes mellitus without complication (HCC)    Diabetic neuropathy (HCC)    Duodenal adenoma    Hyperlipidemia    Hypertension    IC (interstitial cystitis)    Neuromuscular disorder (HCC) 01/09/21   Peripheral  Neuropathy   Pancreatic lesion    Squamous cell carcinoma of skin    Past Surgical History:  Procedure Laterality Date   BIOPSY  12/05/2022   Procedure: BIOPSY;  Surgeon: Wilhelmenia Aloha Raddle., MD;  Location: [facility];  Service: Gastroenterology;;   CATARACT EXTRACTION, BILATERAL     ESOPHAGOGASTRODUODENOSCOPY N/A 12/05/2022   Procedure: ESOPHAGOGASTRODUODENOSCOPY (EGD);  Surgeon: Wilhelmenia Aloha Raddle., MD;  Location: [facility];  Service: Gastroenterology;  Laterality: N/A;   EUS N/A 12/05/2022   Procedure: UPPER ENDOSCOPIC ULTRASOUND (EUS) RADIAL;  Surgeon: Wilhelmenia Aloha Raddle., MD;  Location: [facility];  Service: Gastroenterology;  Laterality: N/A;   FINE NEEDLE ASPIRATION N/A 12/05/2022   Procedure: FINE NEEDLE ASPIRATION (FNA) LINEAR;  Surgeon: Wilhelmenia Aloha Raddle., MD;  Location: [facility];  Service: Gastroenterology;  Laterality: N/A;   HERNIA REPAIR N/A    inguinal   NECK SURGERY     ant/post   SPINE SURGERY     SUBMUCOSAL TATTOO INJECTION  12/05/2022   Procedure: SUBMUCOSAL TATTOO  INJECTION;  Surgeon: Wilhelmenia Aloha Raddle., MD;  Location: [facility];  Service: Gastroenterology;;   TOTAL HIP ARTHROPLASTY Left 07/13/2023   Procedure: ARTHROPLASTY, HIP, TOTAL, ANTERIOR APPROACH;  Surgeon: Kendal Franky SQUIBB, MD;  Location: [facility];  Service: Orthopedics;  Laterality: Left;  [employer] bipolar Hana table  hemi hip   TRANSURETHRAL RESECTION OF PROSTATE     TURP VAPORIZATION     WHIPPLE PROCEDURE N/A 04/27/2023   Procedure: WHIPPLE PROCEDURE;  Surgeon: Aron Shoulders, MD;  Location: [facility] OR;  Service: General;  Laterality: N/A;   Patient Active Problem List   Diagnosis Date Noted   Atrial myxoma 10/31/2023   History of left hip replacement 08/28/2023   Fracture of femoral neck, left (HCC) 07/13/2023   Protein-calorie malnutrition, severe 05/17/2023   Vomiting and diarrhea 05/13/2023   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   Malnutrition of moderate degree 05/08/2023   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   Poorly controlled type 2 diabetes mellitus with circulatory disorder (HCC) 02/08/2023   Vitamin D  deficiency 07/25/2022   Pain in both feet 02/10/2022   Peripheral neuropathy 01/25/2022   Seborrheic dermatitis 01/25/2022   Positive colorectal cancer screening using Cologuard test 03/16/2020   Chronic RLQ pain 03/16/2020   Type 2 diabetes mellitus with hyperglycemia, without long-term current use of insulin  (HCC)  06/02/2011   Hypertension 06/02/2011   Hyperlipemia 06/02/2011   Cataract 06/02/2011    ONSET DATE: Referred on  11/02/23  REFERRING DIAG: None provided; reportedly here for brain fog 2/2 anesthesia post surgeries  THERAPY DIAG:  Cognitive communication deficit  Rationale for Evaluation and Treatment: Rehabilitation  SUBJECTIVE:   SUBJECTIVE STATEMENT: 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   PAIN:  Are you having pain? Yes; 1/10 (soreness); neuropathy   FALLS: Has patient fallen in last 6 months?  Yes, Number of falls: 4   LIVING ENVIRONMENT: Lives with: lives in an adult home; [facility] Lives in: Group home; hoping to return home   *under Long term care   PLOF:  Level of assistance: Needed assistance with ADLs, Needed assistance with IADLS; two surgeries (whipple 04/2023 and hip 07/2023)  Employment: Retired; full Control and instrumentation engineer   PATIENT GOALS: to return back home  OBJECTIVE:  Note: Objective measures were completed at Evaluation unless otherwise noted.  DIAGNOSTIC FINDINGS: Per EMR  CT Head Wo Contrast  IMPRESSION: No CT evidence of acute intracranial abnormality.   Small right frontal scalp hematoma.   Unchanged pituitary/sellar mass.   No acute fracture or traumatic malalignment in the cervical spine.   Degenerative changes and postsurgical changes as above.   Similar appearance of left thyroid  nodule. Reiterate recommendation for thyroid  ultrasound if not previously performed.     Electronically Signed   By: Donnice Mania M.D.   On: 06/26/2023 09:33  COGNITION: Overall cognitive status: Impaired Areas of impairment:  Attention: Impaired: Selective, Alternating, Divided Memory: Impaired: Working Teacher, music term Prospective Auditory Awareness: Impaired: Intellectual Executive function: Impaired: Problem solving, Organization, Planning, Error awareness, Self-correction, and Slow processing Functional deficits: Poor safety awareness; aware of some deficits, but not of all. Wife reports more difficulty than pt shares.   COGNITIVE COMMUNICATION: Following directions: Follows multi-step commands inconsistently  Auditory comprehension: Impaired: 2/2 to  attention/working memory Verbal expression: WFL Functional communication: Impaired: see clinical impression  ORAL MOTOR EXAMINATION: Overall status: Did not assess Comments: NA  STANDARDIZED ASSESSMENTS:   Cognitive Linguistic Quick Test: AGE - 18 - 69   The Cognitive Linguistic Quick Test (CLQT) was administered to assess the relative status of five cognitive domains: attention, memory, language, executive functioning, and visuospatial skills. Scores from 10 tasks were used to estimate severity ratings (standardized for age groups 18-69 years and 70-89 years) for each domain, a clock drawing task, as well as an overall composite severity rating of cognition.       Task Score Criterion Cut Scores  Personal Facts 8/8 8  Symbol Cancellation 11/12 11  Confrontation Naming 10/10 10  Clock Drawing  12/13 12  Story Retelling 5/10 6  Symbol Trails 6/10 9  Generative Naming 4/9 5  Design Memory 4/6 5  Mazes  4/8 7  Design Generation 5/13 6     PATIENT REPORTED OUTCOME MEASURES (PROM): Neuro-QOL Cognitive Function:    Alm: 101/140   [name]: 69/130 (didn't answer 2 questions)  *wife scored pt more severely than pt scored himself.  TREATMENT DATE:   11/29/23: Pt was seen for skilled ST services targeting continued evaluation. See above for CLQT and PROM scores. Pt continues to display decreased awareness of deficits, so SLP gently provided examples of error and discussed how we may fix them. Pt, wife, and SLP collaborated to identify goals for therapy. Goals have been updated to reflect. Throughout today's session, pt was observed to have difficulty with auditory comprehension. He was unable to follow conversation. He was asked a question and would answer with something off topic. He did mention he has always had trouble with finishing tasks, though it has gotten  worse.     PATIENT EDUCATION: Education details: SLP role in cognitive-communication Person educated: Patient and Spouse Education method: Explanation Education comprehension: verbalized understanding and needs further education   GOALS: Goals reviewed with patient? No  SHORT TERM GOALS: Target date: 12/23/23  Complete CLQT, PROM, and update goals Baseline: Goal status: MET  2.  Pt will identify the unsafe situation and provide 1 solution  Baseline:  Goal status: INITIAL  3.  Pt will recall the following steps re: STOP PLAN THINK DO with minA verbal cues Baseline:  Goal status: INITIAL  4.  Pt will recall 2 external memory strategies independently.  Baseline:  Goal status: INITIAL    LONG TERM GOALS: Target date: 01/22/24  Pt/wife will improve score on PROM Baseline:  Goal status: INITIAL  2.  Pt will demonstrate improved safety awareness by acknowledging cognitive impairments Baseline:  Goal status: INITIAL  3.  Pt will verbalize exact steps (meaning) of re: STOP THINK PLAN DO  Baseline:  Goal status: INITIAL  4.  Wife/pt will report successful use of memory strategies at home/in community.  Baseline:  Goal status: INITIAL    ASSESSMENT:  CLINICAL IMPRESSION: Pt is a 73 yo male who presents to ST OP for evaluation with complaints of cognitive changes post surgery. Pt was recently discharged from [facility] in June. Pt and wife endorse re: forgetfulness, difficulty retaining information, difficulty with prospective memory, and safety/judgement. Pt was assessed using CLQT - to complete next session. SLP observed reduced topic maintenance and perseveration on his art. For example, SLP asked what he feels like he struggles with at home and pt never answered the question, but expressed his interest in [employer]. Pt appeared to have decreased insight. It was suggested that wife attention sessions to assist with carryover of strategy. SLP rec skilled ST services to address  cognitive-communication impairment to facilitate return to home. .     OBJECTIVE IMPAIRMENTS: include attention, memory, awareness, and executive functioning. These impairments are limiting patient from managing medications, managing appointments, managing finances, household responsibilities, ADLs/IADLs, and effectively communicating at home and in community. Factors affecting potential to achieve goals and functional outcome are ability to learn/carryover information.. Patient will benefit from skilled SLP services to address above impairments and improve overall function.  REHAB POTENTIAL: Good with wife support  PLAN:  SLP FREQUENCY: 1-2x/week  SLP DURATION: 8 weeks  PLANNED INTERVENTIONS: Environmental controls, Cueing hierachy, Cognitive reorganization, Internal/external aids, Functional tasks, Multimodal communication approach, SLP instruction and feedback, Compensatory strategies, Patient/family education, and 07492 Treatment of speech (30 or 45 min)     [employer], CCC-SLP 11/29/2023, 11:09 AM

## 2023-11-30 DIAGNOSIS — R3915 Urgency of urination: Secondary | ICD-10-CM | POA: Diagnosis not present

## 2023-11-30 DIAGNOSIS — R350 Frequency of micturition: Secondary | ICD-10-CM | POA: Diagnosis not present

## 2023-12-06 ENCOUNTER — Ambulatory Visit: Admitting: Speech Pathology

## 2023-12-06 ENCOUNTER — Ambulatory Visit: Admitting: Occupational Therapy

## 2023-12-06 ENCOUNTER — Encounter: Payer: Self-pay | Admitting: Speech Pathology

## 2023-12-06 ENCOUNTER — Encounter: Payer: Self-pay | Admitting: Occupational Therapy

## 2023-12-06 DIAGNOSIS — M6281 Muscle weakness (generalized): Secondary | ICD-10-CM

## 2023-12-06 DIAGNOSIS — R278 Other lack of coordination: Secondary | ICD-10-CM | POA: Diagnosis not present

## 2023-12-06 DIAGNOSIS — R41840 Attention and concentration deficit: Secondary | ICD-10-CM | POA: Diagnosis not present

## 2023-12-06 DIAGNOSIS — R2689 Other abnormalities of gait and mobility: Secondary | ICD-10-CM

## 2023-12-06 DIAGNOSIS — R41841 Cognitive communication deficit: Secondary | ICD-10-CM | POA: Diagnosis not present

## 2023-12-06 DIAGNOSIS — R41844 Frontal lobe and executive function deficit: Secondary | ICD-10-CM | POA: Diagnosis not present

## 2023-12-06 DIAGNOSIS — R296 Repeated falls: Secondary | ICD-10-CM | POA: Diagnosis not present

## 2023-12-06 NOTE — Therapy (Signed)
 OUTPATIENT OCCUPATIONAL THERAPY NEURO treatment  Patient Name: Andrew Tanner MRN: [id number] DOB:26-Jun-1950, 73 y.o., male Today's Date: 12/06/2023  PCP: Dr. Adaline  REFERRING PROVIDER: Dr. Adaline  END OF SESSION:  OT End of Session - 12/06/23 1445     Visit Number 2    Number of Visits 25    Date for OT Re-Evaluation 02/07/24    Authorization Type [employer] Medicare    Authorization - Visit Number 2    Progress Note Due on Visit 10    OT Start Time 1235    OT Stop Time 1315    OT Time Calculation (min) 40 min    Activity Tolerance Patient tolerated treatment well    Behavior During Therapy WFL for tasks assessed/performed           Past Medical History:  Diagnosis Date   Anxiety    Atrial myxoma    Basal cell carcinoma    Cancer (HCC)    basal cell skin CA   Cataract    Depression    Diabetes mellitus without complication (HCC)    Diabetic neuropathy (HCC)    Duodenal adenoma    Hyperlipidemia    Hypertension    IC (interstitial cystitis)    Neuromuscular disorder (HCC) 01/09/21   Peripheral  Neuropathy   Pancreatic lesion    Squamous cell carcinoma of skin    Past Surgical History:  Procedure Laterality Date   BIOPSY  12/05/2022   Procedure: BIOPSY;  Surgeon: Wilhelmenia Aloha Raddle., MD;  Location: [facility];  Service: Gastroenterology;;   CATARACT EXTRACTION, BILATERAL     ESOPHAGOGASTRODUODENOSCOPY N/A 12/05/2022   Procedure: ESOPHAGOGASTRODUODENOSCOPY (EGD);  Surgeon: Wilhelmenia Aloha Raddle., MD;  Location: [facility];  Service: Gastroenterology;  Laterality: N/A;   EUS N/A 12/05/2022   Procedure: UPPER ENDOSCOPIC ULTRASOUND (EUS) RADIAL;  Surgeon: Wilhelmenia Aloha Raddle., MD;  Location: [facility];  Service: Gastroenterology;  Laterality: N/A;   FINE NEEDLE ASPIRATION N/A 12/05/2022   Procedure: FINE NEEDLE ASPIRATION (FNA) LINEAR;  Surgeon: Wilhelmenia Aloha Raddle., MD;  Location: [facility];  Service: Gastroenterology;  Laterality: N/A;   HERNIA  REPAIR N/A    inguinal   NECK SURGERY     ant/post   SPINE SURGERY     SUBMUCOSAL TATTOO INJECTION  12/05/2022   Procedure: SUBMUCOSAL TATTOO INJECTION;  Surgeon: Wilhelmenia Aloha Raddle., MD;  Location: [facility];  Service: Gastroenterology;;   TOTAL HIP ARTHROPLASTY Left 07/13/2023   Procedure: ARTHROPLASTY, HIP, TOTAL, ANTERIOR APPROACH;  Surgeon: Kendal Franky SQUIBB, MD;  Location: [facility];  Service: Orthopedics;  Laterality: Left;  [employer] bipolar Hana table  hemi hip   TRANSURETHRAL RESECTION OF PROSTATE     TURP VAPORIZATION     WHIPPLE PROCEDURE N/A 04/27/2023   Procedure: WHIPPLE PROCEDURE;  Surgeon: Aron Shoulders, MD;  Location: [facility] OR;  Service: General;  Laterality: N/A;   Patient Active Problem List   Diagnosis Date Noted   Atrial myxoma 10/31/2023   History of left hip replacement 08/28/2023   Fracture of femoral neck, left (HCC) 07/13/2023   Protein-calorie malnutrition, severe 05/17/2023   Vomiting and diarrhea 05/13/2023   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   Malnutrition of moderate degree 05/08/2023   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   Poorly controlled type 2 diabetes mellitus with circulatory disorder (HCC) 02/08/2023   Vitamin D  deficiency 07/25/2022   Pain in both feet 02/10/2022   Peripheral neuropathy 01/25/2022   Seborrheic dermatitis 01/25/2022  Positive colorectal cancer screening using Cologuard test 03/16/2020   Chronic RLQ pain 03/16/2020   Type 2 diabetes mellitus with hyperglycemia, without long-term current use of insulin  (HCC) 06/02/2011   Hypertension 06/02/2011   Hyperlipemia 06/02/2011   Cataract 06/02/2011    ONSET DATE: referral 10/02/23  REFERRING DIAG: Generalized weakness, Idiopathy peripheral neuropathy  L hip replacement   THERAPY DIAG:  Other lack of coordination  Other abnormalities of gait and mobility  Muscle weakness (generalized)  Frontal lobe and  executive function deficit  Attention and concentration deficit  Rationale for Evaluation and Treatment: Rehabilitation  SUBJECTIVE:   SUBJECTIVE STATEMENT: Pt reports he has been doing art on the computer Pt accompanied by: significant other  PERTINENT HISTORY:Pt is a 73 y.o. male admitted via EMS 07/12/23 for fall at rehab facility. X-ray showed L femoral neck fx. L THA performed 4/3. 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 Yes: NPRS scale: 3/10 Pain location: LLE Pain description: aching Aggravating factors: malpositioning Relieving factors: repositioning  FALLS: Has patient fallen in last 6 months? Yes. Number of falls multiple  LIVING ENVIRONMENT: Lives with: lives with their family and lives in an adult home, pt is staying in a home with personal care assistance since he can't climb stairs at home Lives in: House/apartment Stairs: No Has following equipment at home: Walker - 4 wheeled and shower chair  PLOF: Independent  PATIENT GOALS: improve independence, move back home  OBJECTIVE:  Note: Objective measures were completed at Evaluation unless otherwise noted.  HAND DOMINANCE: Right  ADLs:has assist for starting shower, pt performs  with supervision Overall ADLs: increased time required Transfers/ambulation related to ADLs: Eating: mod I  UB Dressing: supervision LB Dressing: supervision Toileting: mod I with walker Bathing: supervision Tub Shower transfers: has tub seat, min A Equipment: Shower seat with back  IADLs:Dependent for IADLs  Handwriting: 100% legible  MOBILITY STATUS: supervision     ACTIVITY TOLERANCE: Activity tolerance: decreased overall  endurance   UPPER EXTREMITY ROM:  A/ROM WFLs    UPPER EXTREMITY MMT:     MMT Right eval Left eval  Shoulder flexion 4-/5 4-/5  Shoulder abduction    Shoulder adduction    Shoulder extension    Shoulder internal rotation    Shoulder external rotation    Middle trapezius    Lower trapezius    Elbow flexion 4/5 4/5  Elbow extension 4/5 4/5  Wrist flexion    Wrist extension    Wrist ulnar deviation    Wrist radial deviation    Wrist pronation    Wrist supination    (Blank rows = not tested)  HAND FUNCTION: Grip strength: Right: 75 lbs; Left: 70 lbs  COORDINATION: 9 Hole Peg test: Right: 36.84 sec; Left: 35.46 sec, tremors and increased difficulty  SENSATION: Not tested    COGNITION: Overall cognitive status: decreased short term memory, correctly completes trail making A, unable to correctly complete trail making B- decreased alternating attention    VISION ASSESSMENT:NT, wears glasses Pt  was noted to bump into door frame x 2 with walker, vision to be further assessed within a functional context.     OBSERVATIONS: 12/06/23- Pt/ wife were instructed in HEP for coordination, see pt instructions. Pt practiced printing using foam grip and  foam grip was issued for handwriting. Pt demonstrates improved printing with v.c to slow down and form letters deliberately. Pt was cued to rest arms on table for improving tremors. Pt was instructed in flicks.  11/15/23-Plesant gentleman with complex medical history accompanied by his wife. Pt is and artist.Pt is living in an adult care home since he can't navigate the stairs at home.                                                                                                                             TREATMENT DATE: 11/15/23- eval         PATIENT EDUCATION: Education details:coordination HEP, handwriting Person educated: Patient and Spouse Education method: Explanation, demonstration, v.c Education comprehension:  verbalized understanding, returned demonstration, needs v.c  HOME EXERCISE PROGRAM: coordiantion, writing   GOALS: Goals reviewed with patient? Yes  SHORT TERM GOALS: Target date: 12/16/23  I with inital HEP.  Goal status: ongoing 12/06/23  2.  Pt will verbalize understanding of memory compensations.  Goal status: INITIAL  3.  Pt will perform an alternating attention task with 80% accuracy for increased ease with IADLs.  Goal status: INITIAL  4.  Pt will perfrom dynamic activities in standing x 10 mins without LOB for increaed ease with ADLs/IADLs.  Goal status: INITIAL  5.  Pt will perform an organization/ scheduling task with 90% or better accuracy (plan meunu/ grocery list, organizing your day task)  Goal status: INITIAL   LONG TERM GOALS: Target date: 02/07/24  I with updated HEP. Baseline:  Goal status: INITIAL  2.  Pt will perform simple snack or sandwich prep mod I.  Goal status: INITIAL  3.  Pt will perform simple IADL tasks such as folding laundry and washing dishes swith supervision.  Goal status: INITIAL  4.  Pt will navigate a busy environment without bumping into items and will locate items with 90% or better accuracy. Baseline:  Goal status: INITIAL  5.  Pt will demonstrate improved alternating attention as evidenced by completing trailmaking B correctly in 3 mins or less.  Goal status: INITIAL    ASSESSMENT:  CLINICAL IMPRESSION: Pt is progressing towards goals for fine motor coordiantion. Pt/ wife verbalize understanding of HEP.PERFORMANCE DEFICITS: in functional skills including ADLs, IADLs, coordination, dexterity, strength, pain, flexibility, Fine motor control, Gross motor control, mobility, balance, endurance, decreased knowledge of precautions, decreased knowledge of use of DME, vision, and UE functional use, cognitive skills including attention, memory, problem solving, safety awareness, sequencing, and temperament/personality, and  psychosocial skills including coping strategies, environmental adaptation, habits, interpersonal interactions, and routines and behaviors.   IMPAIRMENTS: are limiting patient from ADLs, IADLs, work, play, leisure, and social participation.   CO-MORBIDITIES: may have co-morbidities  that affects occupational performance. Patient will benefit from skilled OT to address above impairments and improve overall function.  MODIFICATION OR ASSISTANCE TO COMPLETE EVALUATION: No modification of tasks or assist necessary to complete an evaluation.  OT OCCUPATIONAL  PROFILE AND HISTORY: Detailed assessment: Review of records and additional review of physical, cognitive, psychosocial history related to current functional performance.  CLINICAL DECISION MAKING: LOW - limited treatment options, no task modification necessary  REHAB POTENTIAL: Good  EVALUATION COMPLEXITY: Low    PLAN:  OT FREQUENCY: 1-2x/week  OT DURATION: 12 weeks  PLANNED INTERVENTIONS: 97168 OT Re-evaluation, 97535 self care/ADL training, 02889 therapeutic exercise, 97530 therapeutic activity, 97112 neuromuscular re-education, 97140 manual therapy, 97035 ultrasound, 97018 paraffin, 02989 moist heat, 97010 cryotherapy, 97129 Cognitive training (first 15 min), 02869 Cognitive training(each additional 15 min), balance training, functional mobility training, visual/perceptual remediation/compensation, psychosocial skills training, energy conservation, coping strategies training, patient/family education, and DME and/or AE instructions  RECOMMENDED OTHER SERVICES: PT, ST  CONSULTED AND AGREED WITH PLAN OF CARE: Patient and family member/caregiver  PLAN FOR NEXT SESSION: inital HEP for  UE strength, consider memory compensations   Sherlynn Tourville, OT 12/06/2023, 2:46 PM

## 2023-12-06 NOTE — Patient Instructions (Addendum)
  Coordination Activities  Perform the following activities for 20 minutes 1 times per day with both hand(s). Perfrom all tasks slowly and deliberately  Flip cards 1 at a time  Deal cards with your thumb (Hold deck in hand and push card off top with thumb). Pick up coins and place in container or coin bank. Pick up coins and stack. Practice writing and/or typing. Use pen with foam grip, work on Civil engineer, contracting

## 2023-12-06 NOTE — Therapy (Unsigned)
 OUTPATIENT SPEECH LANGUAGE PATHOLOGY TREATMENT   Patient Name: Andrew Tanner MRN: [id number] DOB:Aug 15, 1950, 73 y.o., male Today's Date: 12/06/2023  PCP: Wendolyn Jenkins Jansky, MD  REFERRING PROVIDER: Wendolyn Jenkins Jansky, MD  END OF SESSION:  End of Session - 12/06/23 1108     Visit Number 3    Number of Visits 12    Date for SLP Re-Evaluation 01/21/24    SLP Start Time 1105    SLP Stop Time  1145    SLP Time Calculation (min) 40 min    Activity Tolerance Patient tolerated treatment well          Past Medical History:  Diagnosis Date   Anxiety    Atrial myxoma    Basal cell carcinoma    Cancer (HCC)    basal cell skin CA   Cataract    Depression    Diabetes mellitus without complication (HCC)    Diabetic neuropathy (HCC)    Duodenal adenoma    Hyperlipidemia    Hypertension    IC (interstitial cystitis)    Neuromuscular disorder (HCC) 01/09/21   Peripheral  Neuropathy   Pancreatic lesion    Squamous cell carcinoma of skin    Past Surgical History:  Procedure Laterality Date   BIOPSY  12/05/2022   Procedure: BIOPSY;  Surgeon: Wilhelmenia Aloha Raddle., MD;  Location: [facility];  Service: Gastroenterology;;   CATARACT EXTRACTION, BILATERAL     ESOPHAGOGASTRODUODENOSCOPY N/A 12/05/2022   Procedure: ESOPHAGOGASTRODUODENOSCOPY (EGD);  Surgeon: Wilhelmenia Aloha Raddle., MD;  Location: [facility];  Service: Gastroenterology;  Laterality: N/A;   EUS N/A 12/05/2022   Procedure: UPPER ENDOSCOPIC ULTRASOUND (EUS) RADIAL;  Surgeon: Wilhelmenia Aloha Raddle., MD;  Location: [facility];  Service: Gastroenterology;  Laterality: N/A;   FINE NEEDLE ASPIRATION N/A 12/05/2022   Procedure: FINE NEEDLE ASPIRATION (FNA) LINEAR;  Surgeon: Wilhelmenia Aloha Raddle., MD;  Location: [facility];  Service: Gastroenterology;  Laterality: N/A;   HERNIA REPAIR N/A    inguinal   NECK SURGERY     ant/post   SPINE SURGERY     SUBMUCOSAL TATTOO INJECTION  12/05/2022   Procedure: SUBMUCOSAL TATTOO  INJECTION;  Surgeon: Wilhelmenia Aloha Raddle., MD;  Location: [facility];  Service: Gastroenterology;;   TOTAL HIP ARTHROPLASTY Left 07/13/2023   Procedure: ARTHROPLASTY, HIP, TOTAL, ANTERIOR APPROACH;  Surgeon: Kendal Franky SQUIBB, MD;  Location: [facility];  Service: Orthopedics;  Laterality: Left;  [employer] bipolar Hana table  hemi hip   TRANSURETHRAL RESECTION OF PROSTATE     TURP VAPORIZATION     WHIPPLE PROCEDURE N/A 04/27/2023   Procedure: WHIPPLE PROCEDURE;  Surgeon: Aron Shoulders, MD;  Location: [facility] OR;  Service: General;  Laterality: N/A;   Patient Active Problem List   Diagnosis Date Noted   Atrial myxoma 10/31/2023   History of left hip replacement 08/28/2023   Fracture of femoral neck, left (HCC) 07/13/2023   Protein-calorie malnutrition, severe 05/17/2023   Vomiting and diarrhea 05/13/2023   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   Malnutrition of moderate degree 05/08/2023   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   Poorly controlled type 2 diabetes mellitus with circulatory disorder (HCC) 02/08/2023   Vitamin D  deficiency 07/25/2022   Pain in both feet 02/10/2022   Peripheral neuropathy 01/25/2022   Seborrheic dermatitis 01/25/2022   Positive colorectal cancer screening using Cologuard test 03/16/2020   Chronic RLQ pain 03/16/2020   Type 2 diabetes mellitus with hyperglycemia, without long-term current use of insulin  (HCC)  06/02/2011   Hypertension 06/02/2011   Hyperlipemia 06/02/2011   Cataract 06/02/2011    ONSET DATE: Referred on  11/02/23  REFERRING DIAG: None provided; reportedly here for brain fog 2/2 anesthesia post surgeries  THERAPY DIAG:  Cognitive communication deficit  Rationale for Evaluation and Treatment: Rehabilitation  SUBJECTIVE:   SUBJECTIVE STATEMENT: Things have been going well.  Pt accompanied by: self and significant other; [name]  PERTINENT HISTORY: Per chart review: 73 year old male  with history of diabetes, neuropathy, hypertension, hyperlipidemia here for evaluation of memory, cognitive changes and generalized weakness.   PAIN:  Are you having pain? Yes; 1/10 (soreness); neuropathy   FALLS: Has patient fallen in last 6 months?  Yes, Number of falls: 4   LIVING ENVIRONMENT: Lives with: lives in an adult home; [facility] Lives in: Group home; hoping to return home   *under Long term care   PLOF:  Level of assistance: Needed assistance with ADLs, Needed assistance with IADLS; two surgeries (whipple 04/2023 and hip 07/2023)  Employment: Retired; full Control and instrumentation engineer   PATIENT GOALS: to return back home  OBJECTIVE:  Note: Objective measures were completed at Evaluation unless otherwise noted.  DIAGNOSTIC FINDINGS: Per EMR  CT Head Wo Contrast  IMPRESSION: No CT evidence of acute intracranial abnormality.   Small right frontal scalp hematoma.   Unchanged pituitary/sellar mass.   No acute fracture or traumatic malalignment in the cervical spine.   Degenerative changes and postsurgical changes as above.   Similar appearance of left thyroid  nodule. Reiterate recommendation for thyroid  ultrasound if not previously performed.     Electronically Signed   By: Donnice Mania M.D.   On: 06/26/2023 09:33  COGNITION: Overall cognitive status: Impaired Areas of impairment:  Attention: Impaired: Selective, Alternating, Divided Memory: Impaired: Working Teacher, music term Prospective Auditory Awareness: Impaired: Intellectual Executive function: Impaired: Problem solving, Organization, Planning, Error awareness, Self-correction, and Slow processing Functional deficits: Poor safety awareness; aware of some deficits, but not of all. Wife reports more difficulty than pt shares.   COGNITIVE COMMUNICATION: Following directions: Follows multi-step commands inconsistently  Auditory comprehension: Impaired: 2/2 to attention/working memory Verbal expression:  WFL Functional communication: Impaired: see clinical impression  ORAL MOTOR EXAMINATION: Overall status: Did not assess Comments: NA  STANDARDIZED ASSESSMENTS:   Cognitive Linguistic Quick Test: AGE - 18 - 69   The Cognitive Linguistic Quick Test (CLQT) was administered to assess the relative status of five cognitive domains: attention, memory, language, executive functioning, and visuospatial skills. Scores from 10 tasks were used to estimate severity ratings (standardized for age groups 18-69 years and 70-89 years) for each domain, a clock drawing task, as well as an overall composite severity rating of cognition.       Task Score Criterion Cut Scores  Personal Facts 8/8 8  Symbol Cancellation 11/12 11  Confrontation Naming 10/10 10  Clock Drawing  12/13 12  Story Retelling 5/10 6  Symbol Trails 6/10 9  Generative Naming 4/9 5  Design Memory 4/6 5  Mazes  4/8 7  Design Generation 5/13 6     PATIENT REPORTED OUTCOME MEASURES (PROM): Neuro-QOL Cognitive Function:    Alm: 101/140   [name]: 69/130 (didn't answer 2 questions)  *wife scored pt more severely than pt scored himself.  TREATMENT DATE:   12/06/23: Pt was seen for skilled ST services targeting cognitive-communication. SLP initiated education on Stop, Think, Plan, Do to reduce impulsivity. SLP used personally-relevant example re: before going to get the mail. Pt perseverated on him falling on the ice one time when attempting to get the mail, and required gentle prompting to return to subject. SLP provided pt with a few safety scenarios to see how he answered each question. Pt would benefit from more practice of this. Overall, pt did appear to have increased insight/awareness into deficits today. We discussed that even if you FEEL you can do something, we should always made safety a priority (I.e.  using walker). Pt asked for walker before standing to leave in today's session.   11/29/23: Pt was seen for skilled ST services targeting continued evaluation. See above for CLQT and PROM scores. Pt continues to display decreased awareness of deficits, so SLP gently provided examples of error and discussed how we may fix them. Pt, wife, and SLP collaborated to identify goals for therapy. Goals have been updated to reflect. Throughout today's session, pt was observed to have difficulty with auditory comprehension. He was unable to follow conversation. He was asked a question and would answer with something off topic. He did mention he has always had trouble with finishing tasks, though it has gotten worse.     PATIENT EDUCATION: Education details: SLP role in cognitive-communication Person educated: Patient and Spouse Education method: Explanation Education comprehension: verbalized understanding and needs further education   GOALS: Goals reviewed with patient? No  SHORT TERM GOALS: Target date: 12/23/23  Complete CLQT, PROM, and update goals Baseline: Goal status: MET  2.  Pt will identify the unsafe situation and provide 1 solution  Baseline:  Goal status: INITIAL  3.  Pt will recall the following steps re: STOP PLAN THINK DO with minA verbal cues Baseline:  Goal status: INITIAL  4.  Pt will recall 2 external memory strategies independently.  Baseline:  Goal status: INITIAL    LONG TERM GOALS: Target date: 01/22/24  Pt/wife will improve score on PROM Baseline:  Goal status: INITIAL  2.  Pt will demonstrate improved safety awareness by acknowledging cognitive impairments Baseline:  Goal status: INITIAL  3.  Pt will verbalize exact steps (meaning) of re: STOP THINK PLAN DO  Baseline:  Goal status: INITIAL  4.  Wife/pt will report successful use of memory strategies at home/in community.  Baseline:  Goal status: INITIAL    ASSESSMENT:  CLINICAL IMPRESSION: Pt is  a 73 yo male who presents to ST OP for evaluation with complaints of cognitive changes post surgery. Pt was recently discharged from [facility] in June. Pt and wife endorse re: forgetfulness, difficulty retaining information, difficulty with prospective memory, and safety/judgement. Pt was assessed using CLQT - to complete next session. SLP observed reduced topic maintenance and perseveration on his art. For example, SLP asked what he feels like he struggles with at home and pt never answered the question, but expressed his interest in [employer]. Pt appeared to have decreased insight. It was suggested that wife attention sessions to assist with carryover of strategy. SLP rec skilled ST services to address cognitive-communication impairment to facilitate return to home. .     OBJECTIVE IMPAIRMENTS: include attention, memory, awareness, and executive functioning. These impairments are limiting patient from managing medications, managing appointments, managing finances, household responsibilities, ADLs/IADLs, and effectively communicating at home and in community. Factors affecting potential to achieve goals and functional outcome are  ability to learn/carryover information.. Patient will benefit from skilled SLP services to address above impairments and improve overall function.  REHAB POTENTIAL: Good with wife support  PLAN:  SLP FREQUENCY: 1-2x/week  SLP DURATION: 8 weeks  PLANNED INTERVENTIONS: Environmental controls, Cueing hierachy, Cognitive reorganization, Internal/external aids, Functional tasks, Multimodal communication approach, SLP instruction and feedback, Compensatory strategies, Patient/family education, and 07492 Treatment of speech (30 or 45 min)     [employer], CCC-SLP 12/06/2023, 11:09 AM

## 2023-12-07 ENCOUNTER — Encounter: Payer: Self-pay | Admitting: Dermatology

## 2023-12-07 DIAGNOSIS — R350 Frequency of micturition: Secondary | ICD-10-CM | POA: Diagnosis not present

## 2023-12-07 DIAGNOSIS — R3915 Urgency of urination: Secondary | ICD-10-CM | POA: Diagnosis not present

## 2023-12-12 NOTE — Therapy (Signed)
 OUTPATIENT PHYSICAL THERAPY LOWER EXTREMITY TREATMENT   Patient Name: Andrew Tanner MRN: [id number] DOB:1950/10/19, 73 y.o., male Today's Date: 12/13/2023  END OF SESSION:  PT End of Session - 12/13/23 1400     Visit Number 2    Date for PT Re-Evaluation 02/13/24    Authorization Type [employer] Medicare    PT Start Time 1400    PT Stop Time 1445    PT Time Calculation (min) 45 min    Equipment Utilized During Treatment Gait belt           Past Medical History:  Diagnosis Date   Anxiety    Atrial myxoma    Basal cell carcinoma    Cancer (HCC)    basal cell skin CA   Cataract    Depression    Diabetes mellitus without complication (HCC)    Diabetic neuropathy (HCC)    Duodenal adenoma    Hyperlipidemia    Hypertension    IC (interstitial cystitis)    Neuromuscular disorder (HCC) 01/09/21   Peripheral  Neuropathy   Pancreatic lesion    Squamous cell carcinoma of skin    Past Surgical History:  Procedure Laterality Date   BIOPSY  12/05/2022   Procedure: BIOPSY;  Surgeon: Wilhelmenia Aloha Raddle., MD;  Location: [facility];  Service: Gastroenterology;;   CATARACT EXTRACTION, BILATERAL     ESOPHAGOGASTRODUODENOSCOPY N/A 12/05/2022   Procedure: ESOPHAGOGASTRODUODENOSCOPY (EGD);  Surgeon: Wilhelmenia Aloha Raddle., MD;  Location: [facility];  Service: Gastroenterology;  Laterality: N/A;   EUS N/A 12/05/2022   Procedure: UPPER ENDOSCOPIC ULTRASOUND (EUS) RADIAL;  Surgeon: Wilhelmenia Aloha Raddle., MD;  Location: [facility];  Service: Gastroenterology;  Laterality: N/A;   FINE NEEDLE ASPIRATION N/A 12/05/2022   Procedure: FINE NEEDLE ASPIRATION (FNA) LINEAR;  Surgeon: Wilhelmenia Aloha Raddle., MD;  Location: [facility];  Service: Gastroenterology;  Laterality: N/A;   HERNIA REPAIR N/A    inguinal   NECK SURGERY     ant/post   SPINE SURGERY     SUBMUCOSAL TATTOO INJECTION  12/05/2022   Procedure: SUBMUCOSAL TATTOO INJECTION;  Surgeon: Wilhelmenia Aloha Raddle., MD;   Location: [facility];  Service: Gastroenterology;;   TOTAL HIP ARTHROPLASTY Left 07/13/2023   Procedure: ARTHROPLASTY, HIP, TOTAL, ANTERIOR APPROACH;  Surgeon: Kendal Franky SQUIBB, MD;  Location: [facility];  Service: Orthopedics;  Laterality: Left;  [employer] bipolar Hana table  hemi hip   TRANSURETHRAL RESECTION OF PROSTATE     TURP VAPORIZATION     WHIPPLE PROCEDURE N/A 04/27/2023   Procedure: WHIPPLE PROCEDURE;  Surgeon: Aron Shoulders, MD;  Location: [facility] OR;  Service: General;  Laterality: N/A;   Patient Active Problem List   Diagnosis Date Noted   Atrial myxoma 10/31/2023   History of left hip replacement 08/28/2023   Fracture of femoral neck, left (HCC) 07/13/2023   Protein-calorie malnutrition, severe 05/17/2023   Vomiting and diarrhea 05/13/2023   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   Malnutrition of moderate degree 05/08/2023   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   Poorly controlled type 2 diabetes mellitus with circulatory disorder (HCC) 02/08/2023   Vitamin D  deficiency 07/25/2022   Pain in both feet 02/10/2022   Peripheral neuropathy 01/25/2022   Seborrheic dermatitis 01/25/2022   Positive colorectal cancer screening using Cologuard test 03/16/2020   Chronic RLQ pain 03/16/2020   Type 2 diabetes mellitus with hyperglycemia, without long-term current use of insulin  (HCC) 06/02/2011   Hypertension 06/02/2011   Hyperlipemia 06/02/2011  Cataract 06/02/2011    PCP: Jenkins Earnie Carrel  REFERRING PROVIDER: Jenkins Earnie Carrel  REFERRING DIAG:  [phone] (ICD-10-CM) - History of left hip replacement  E44.0 (ICD-10-CM) - Malnutrition of moderate degree (HCC)  G60.9 (ICD-10-CM) - Idiopathic peripheral neuropathy  R53.1 (ICD-10-CM) - Generalized weakness    THERAPY DIAG:  Other lack of coordination  Other abnormalities of gait and mobility  Muscle weakness (generalized)  Rationale for Evaluation and Treatment:  Rehabilitation  ONSET DATE: 07/12/23  SUBJECTIVE:   SUBJECTIVE STATEMENT: No changes since the eval. I have been doing my exercises, they are going pretty good. Doing some more walking.   PERTINENT HISTORY: 11/13/23--73 year old male with history of diabetes, neuropathy, hypertension, hyperlipidemia here for evaluation of memory, cognitive changes and generalized weakness.   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  He was admitted to the hospital for surgical treatment with Whipple procedure.  He was discharged home with couple weeks later.  He was readmitted on 05/13/2023 after increased diarrhea, weakness and falling down at home.  He was admitted for approximately 4 weeks with ongoing vomiting, diarrhea, protein calorie malnutrition, colitis and muscular deconditioning.  This time he was discharged to skilled nursing facility.   06/26/2023 patient fell down at nursing home, hit his head, and went to the emergency room.   07/12/2023 patient fell down again resulting in the left femoral neck fracture requiring admission and surgery. Overall patient has had significant difficulty returning to his baseline.  Wife notes that he is having some ongoing short-term memory difficulty, decreased insight, brain fog issues.  Also continues to be generally deconditioned and weak.  They live in a two-story home with the bedroom and bathroom upstairs, 14 steps, and this remains a challenge for patient to transition from skilled nursing facility back home.   Pt is a 73 y.o. male admitted via EMS 07/12/23 for fall at rehab facility. X-ray showed L femoral neck fx. L THA performed 4/3. 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,  basal cell carcinoma, diabetes mellitus, diabetic neuropathy, neuromuscular disorder, hyperlipidemia, malignant neuroendocrine tumor of duodenum   PAIN:  Are you having pain? Yes: NPRS scale: at worst 9/10 Pain location: feet, my  hip sometimes hurts Pain description: stinging, cold sensation Aggravating factors: worse at end of day Relieving factors: meds, lying down, exercise   PRECAUTIONS: Fall  RED FLAGS: None   WEIGHT BEARING RESTRICTIONS: No  FALLS:  Has patient fallen in last 6 months? Yes. Number of falls no falls in the last 3 months, but at least 12 in the last 6 months   LIVING ENVIRONMENT: Lives with: lives with their spouse Lives in: House/apartment Stairs: Yes: Internal: 14 steps; on right going up and on left going up and External: 2 in garage and 2 to front door steps; can reach both and bilateral but cannot reach both Has following equipment at home: Single point cane and Walker - 2 wheeled  **currently living at a Assisted Living for now**  OCCUPATION: An Tree surgeon   PLOF: Independent, Independent with basic ADLs, and Independent with gait  PATIENT GOALS: to get more independent, get back to my home  NEXT MD VISIT:   OBJECTIVE:  Note: Objective measures were completed at Evaluation unless otherwise noted.  DIAGNOSTIC FINDINGS: Left hip arthroplasty in expected alignment. No periprosthetic lucency or fracture. Recent postsurgical change includes air and edema in the soft tissues. Left hip arthroplasty without immediate postoperative complication.   COGNITION: Overall  cognitive status: Within functional limits for tasks assessed     SENSATION: Neuropathy in B feet    POSTURE: rounded shoulders and forward head   LOWER EXTREMITY ROM: WNL   LOWER EXTREMITY MMT: 4+/5 BLE    FUNCTIONAL TESTS:  5 times sit to stand: 19s  Timed up and go (TUG): 22s  GAIT: Distance walked: in clinic distances Assistive device utilized: Environmental consultant - 2 wheeled and None Level of assistance: Complete Independence and Modified independence Comments: able to complete TUG without walker, slow but is safe                                                                                                                         TREATMENT DATE: 12/13/23 NuStep L5x35mins  LAQ 2# 2x10 HS curls green 2x10 Seated march and hip abd with band 2x10 Walking with cane 2 small laps around equipment STS 2x10   11/21/23 EVAL, POC    PATIENT EDUCATION:  Education details: POC, HEP, fall risk Person educated: Patient and Spouse Education method: Medical illustrator Education comprehension: verbalized understanding and returned demonstration  HOME EXERCISE PROGRAM: Access Code: [email or web address] URL: [email or web address] Date: 11/21/2023 Prepared by: Almetta Fam  Exercises - Sit to Stand  - 1 x daily - 7 x weekly - 2 sets - 10 reps - Seated Long Arc Quad  - 1 x daily - 7 x weekly - 2 sets - 10 reps - Standing Hip Abduction with Counter Support  - 1 x daily - 7 x weekly - 2 sets - 10 reps - Standing Hip Extension with Counter Support  - 1 x daily - 7 x weekly - 2 sets - 10 reps - Heel Raises with Counter Support  - 1 x daily - 7 x weekly - 2 sets - 10 reps  ASSESSMENT:  CLINICAL IMPRESSION: Patient returns after almost a month since eval. He reports no changes in function but has been compliant with HEP. He is a bit impulsive and unsafe with the walker at times. We began with some light strengthening for his LE's. He tolerated it well, needing some cues. Wife brought in his cane and wanted him to practice walking with it today. Pt is very unsteady with the cane. He needs cues to move the cane and his LLE at the same time, but sequencing and getting the rhythm was difficult for him. He also tends to move the cane out too far ahead of him. Will keep practicing gait. STS were the most difficult out of interventions today.    Patient is a 73 y.o. male who was seen today for physical therapy evaluation and treatment for weakness. He has an extensive medical history and multiple surgeries that has caused him to lose a lot of weight and muscle mass. His wife reports a number of falls since  the beginning of the year but none in the past 3 months. Currently he is staying at an assisted living to work on ADLs and  increasing his independence. He walks with a walker outside of his home. His wife reports he has a cane, but needs practice on how to use it. Pt reports he does some walking around the house without it. At their home, he has stairs up to the bedroom and bathrooms so would like to practice working on this. He does have neuropathy and reports high levels of pain at times in both feet. Patient will benefit from PT to increase his strength and allow him to improve functional independence and mobility.   OBJECTIVE IMPAIRMENTS: Abnormal gait, decreased activity tolerance, decreased balance, decreased coordination, decreased endurance, difficulty walking, decreased strength, decreased safety awareness, and pain.   ACTIVITY LIMITATIONS: carrying, lifting, squatting, stairs, transfers, and locomotion level  PARTICIPATION LIMITATIONS: meal prep, cleaning, laundry, driving, shopping, community activity, and yard work  PERSONAL FACTORS: Fitness, Past/current experiences, Time since onset of injury/illness/exacerbation, and 3+ comorbidities: istory of diabetes, neuropathy, hypertension, hyperlipidemia here for evaluation of memory, cognitive changes and generalized weakness are also affecting patient's functional outcome.   REHAB POTENTIAL: Good  CLINICAL DECISION MAKING: Stable/uncomplicated  EVALUATION COMPLEXITY: Low  GOALS: Goals reviewed with patient? Yes  SHORT TERM GOALS: Target date: 01/02/24  Patient will be independent with initial HEP. Baseline:  Goal status: MET 12/13/23  2.  Patient will demonstrate improved functional LE strength as demonstrated by 5xSTS <15s. Baseline: 19s Goal status: INITIAL  3.  Patient will be educated on strategies to decrease risk of falls.  Baseline:  Goal status: IN PROGRESS 12/13/23   LONG TERM GOALS: Target date: 02/13/24  Patient will  be independent with advanced/ongoing HEP to improve outcomes and carryover.  Baseline:  Goal status: INITIAL  2.  Patient will be able to ambulate 500' without AD with good safety to access community.  Baseline: short distances without AD Goal status: INITIAL  3.  Patient will be able to navigate a set of stairs with reciprocal pattern for safety inside his home.  Baseline:  Goal status: INITIAL   4.  Patient will demonstrate decreased fall risk by scoring < 14 sec on TUG. Baseline: 22s without walker Goal status: INITIAL  PLAN:  PT FREQUENCY: 2x/week  PT DURATION: 12 weeks  PLANNED INTERVENTIONS: 97110-Therapeutic exercises, 97530- Therapeutic activity, 97112- Neuromuscular re-education, 97535- Self Care, [id number]- Manual therapy, [phone]- Gait training, [phone]- Electrical stimulation (unattended), 97016- Vasopneumatic device, 20560 (1-2 muscles), 20561 (3+ muscles)- Dry Needling, Patient/Family education, Balance training, Stair training, Joint mobilization, Joint manipulation, Spinal manipulation, Spinal mobilization, Cryotherapy, and Moist heat  PLAN FOR NEXT SESSION: LE strengthening, gait training with cane, functional tasks   [employer], PT 12/13/2023, 2:43 PM

## 2023-12-13 ENCOUNTER — Ambulatory Visit: Attending: Family Medicine | Admitting: Speech Pathology

## 2023-12-13 ENCOUNTER — Ambulatory Visit: Admitting: Occupational Therapy

## 2023-12-13 ENCOUNTER — Encounter: Payer: Self-pay | Admitting: Occupational Therapy

## 2023-12-13 ENCOUNTER — Ambulatory Visit

## 2023-12-13 ENCOUNTER — Encounter: Payer: Self-pay | Admitting: Speech Pathology

## 2023-12-13 DIAGNOSIS — R41844 Frontal lobe and executive function deficit: Secondary | ICD-10-CM

## 2023-12-13 DIAGNOSIS — R41840 Attention and concentration deficit: Secondary | ICD-10-CM | POA: Diagnosis not present

## 2023-12-13 DIAGNOSIS — R41841 Cognitive communication deficit: Secondary | ICD-10-CM | POA: Insufficient documentation

## 2023-12-13 DIAGNOSIS — M6281 Muscle weakness (generalized): Secondary | ICD-10-CM | POA: Insufficient documentation

## 2023-12-13 DIAGNOSIS — R278 Other lack of coordination: Secondary | ICD-10-CM

## 2023-12-13 DIAGNOSIS — R2689 Other abnormalities of gait and mobility: Secondary | ICD-10-CM

## 2023-12-13 DIAGNOSIS — R296 Repeated falls: Secondary | ICD-10-CM | POA: Diagnosis not present

## 2023-12-13 NOTE — Therapy (Unsigned)
 OUTPATIENT SPEECH LANGUAGE PATHOLOGY TREATMENT   Patient Name: Andrew Tanner MRN: [id number] DOB:October 02, 1950, 73 y.o., male Today's Date: 12/13/2023  PCP: Wendolyn Jenkins Jansky, MD  REFERRING PROVIDER: Wendolyn Jenkins Jansky, MD  END OF SESSION:  End of Session - 12/13/23 1235     Visit Number 4    Number of Visits 12    Date for SLP Re-Evaluation 01/21/24    SLP Start Time 1230    SLP Stop Time  1310    SLP Time Calculation (min) 40 min    Activity Tolerance Patient tolerated treatment well          Past Medical History:  Diagnosis Date   Anxiety    Atrial myxoma    Basal cell carcinoma    Cancer (HCC)    basal cell skin CA   Cataract    Depression    Diabetes mellitus without complication (HCC)    Diabetic neuropathy (HCC)    Duodenal adenoma    Hyperlipidemia    Hypertension    IC (interstitial cystitis)    Neuromuscular disorder (HCC) 01/09/21   Peripheral  Neuropathy   Pancreatic lesion    Squamous cell carcinoma of skin    Past Surgical History:  Procedure Laterality Date   BIOPSY  12/05/2022   Procedure: BIOPSY;  Surgeon: Wilhelmenia Aloha Raddle., MD;  Location: [facility];  Service: Gastroenterology;;   CATARACT EXTRACTION, BILATERAL     ESOPHAGOGASTRODUODENOSCOPY N/A 12/05/2022   Procedure: ESOPHAGOGASTRODUODENOSCOPY (EGD);  Surgeon: Wilhelmenia Aloha Raddle., MD;  Location: [facility];  Service: Gastroenterology;  Laterality: N/A;   EUS N/A 12/05/2022   Procedure: UPPER ENDOSCOPIC ULTRASOUND (EUS) RADIAL;  Surgeon: Wilhelmenia Aloha Raddle., MD;  Location: [facility];  Service: Gastroenterology;  Laterality: N/A;   FINE NEEDLE ASPIRATION N/A 12/05/2022   Procedure: FINE NEEDLE ASPIRATION (FNA) LINEAR;  Surgeon: Wilhelmenia Aloha Raddle., MD;  Location: [facility];  Service: Gastroenterology;  Laterality: N/A;   HERNIA REPAIR N/A    inguinal   NECK SURGERY     ant/post   SPINE SURGERY     SUBMUCOSAL TATTOO INJECTION  12/05/2022   Procedure: SUBMUCOSAL TATTOO  INJECTION;  Surgeon: Wilhelmenia Aloha Raddle., MD;  Location: [facility];  Service: Gastroenterology;;   TOTAL HIP ARTHROPLASTY Left 07/13/2023   Procedure: ARTHROPLASTY, HIP, TOTAL, ANTERIOR APPROACH;  Surgeon: Kendal Franky SQUIBB, MD;  Location: [facility];  Service: Orthopedics;  Laterality: Left;  [employer] bipolar Hana table  hemi hip   TRANSURETHRAL RESECTION OF PROSTATE     TURP VAPORIZATION     WHIPPLE PROCEDURE N/A 04/27/2023   Procedure: WHIPPLE PROCEDURE;  Surgeon: Aron Shoulders, MD;  Location: [facility] OR;  Service: General;  Laterality: N/A;   Patient Active Problem List   Diagnosis Date Noted   Atrial myxoma 10/31/2023   History of left hip replacement 08/28/2023   Fracture of femoral neck, left (HCC) 07/13/2023   Protein-calorie malnutrition, severe 05/17/2023   Vomiting and diarrhea 05/13/2023   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   Malnutrition of moderate degree 05/08/2023   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   Poorly controlled type 2 diabetes mellitus with circulatory disorder (HCC) 02/08/2023   Vitamin D  deficiency 07/25/2022   Pain in both feet 02/10/2022   Peripheral neuropathy 01/25/2022   Seborrheic dermatitis 01/25/2022   Positive colorectal cancer screening using Cologuard test 03/16/2020   Chronic RLQ pain 03/16/2020   Type 2 diabetes mellitus with hyperglycemia, without long-term current use of insulin  (HCC)  06/02/2011   Hypertension 06/02/2011   Hyperlipemia 06/02/2011   Cataract 06/02/2011    ONSET DATE: Referred on  11/02/23  REFERRING DIAG: None provided; reportedly here for brain fog 2/2 anesthesia post surgeries  THERAPY DIAG:  Cognitive communication deficit  Rationale for Evaluation and Treatment: Rehabilitation  SUBJECTIVE:   SUBJECTIVE STATEMENT: Things have been going well.  Pt accompanied by: self and significant other; [name]  PERTINENT HISTORY: Per chart review: 73 year old male  with history of diabetes, neuropathy, hypertension, hyperlipidemia here for evaluation of memory, cognitive changes and generalized weakness.   PAIN:  Are you having pain? Yes; 1/10 (soreness); neuropathy   FALLS: Has patient fallen in last 6 months?  Yes, Number of falls: 4   LIVING ENVIRONMENT: Lives with: lives in an adult home; [facility] Lives in: Group home; hoping to return home   *under Long term care   PLOF:  Level of assistance: Needed assistance with ADLs, Needed assistance with IADLS; two surgeries (whipple 04/2023 and hip 07/2023)  Employment: Retired; full Control and instrumentation engineer   PATIENT GOALS: to return back home  OBJECTIVE:  Note: Objective measures were completed at Evaluation unless otherwise noted.  DIAGNOSTIC FINDINGS: Per EMR  CT Head Wo Contrast  IMPRESSION: No CT evidence of acute intracranial abnormality.   Small right frontal scalp hematoma.   Unchanged pituitary/sellar mass.   No acute fracture or traumatic malalignment in the cervical spine.   Degenerative changes and postsurgical changes as above.   Similar appearance of left thyroid  nodule. Reiterate recommendation for thyroid  ultrasound if not previously performed.     Electronically Signed   By: Donnice Mania M.D.   On: 06/26/2023 09:33  COGNITION: Overall cognitive status: Impaired Areas of impairment:  Attention: Impaired: Selective, Alternating, Divided Memory: Impaired: Working Teacher, music term Prospective Auditory Awareness: Impaired: Intellectual Executive function: Impaired: Problem solving, Organization, Planning, Error awareness, Self-correction, and Slow processing Functional deficits: Poor safety awareness; aware of some deficits, but not of all. Wife reports more difficulty than pt shares.   COGNITIVE COMMUNICATION: Following directions: Follows multi-step commands inconsistently  Auditory comprehension: Impaired: 2/2 to attention/working memory Verbal expression:  WFL Functional communication: Impaired: see clinical impression  ORAL MOTOR EXAMINATION: Overall status: Did not assess Comments: NA  STANDARDIZED ASSESSMENTS:   Cognitive Linguistic Quick Test: AGE - 18 - 69   The Cognitive Linguistic Quick Test (CLQT) was administered to assess the relative status of five cognitive domains: attention, memory, language, executive functioning, and visuospatial skills. Scores from 10 tasks were used to estimate severity ratings (standardized for age groups 18-69 years and 70-89 years) for each domain, a clock drawing task, as well as an overall composite severity rating of cognition.       Task Score Criterion Cut Scores  Personal Facts 8/8 8  Symbol Cancellation 11/12 11  Confrontation Naming 10/10 10  Clock Drawing  12/13 12  Story Retelling 5/10 6  Symbol Trails 6/10 9  Generative Naming 4/9 5  Design Memory 4/6 5  Mazes  4/8 7  Design Generation 5/13 6     PATIENT REPORTED OUTCOME MEASURES (PROM): Neuro-QOL Cognitive Function:    Alm: 101/140   [name]: 69/130 (didn't answer 2 questions)  *wife scored pt more severely than pt scored himself.  TREATMENT DATE:   12/13/23: Pt was seen for skilled ST services targeting cognitive-communication. SLP asked pt to review previous session. Pt was able to recall 2 steps of Stop-Think-Plan-Do - but he does feel like he has been trying to pause and think. Wife reports she feels like Olie has been making more focused effort to slow down. Pt is showing increased awareness of impairments and is providing examples of where he needs to be mindful re: looking for body language of when someone is going to move to help anticipate. He also reports he has begun writing things down to help support memory. SLP facilitated session by asking pt safety problem solving questions. Pt required overall  minA to complete. Provided for HEP. Cont with current POC.   12/06/23: Pt was seen for skilled ST services targeting cognitive-communication. SLP initiated education on Stop, Think, Plan, Do to reduce impulsivity. SLP used personally-relevant example re: before going to get the mail. Pt perseverated on him falling on the ice one time when attempting to get the mail, and required gentle prompting to return to subject. SLP provided pt with a few safety scenarios to see how he answered each question. Pt would benefit from more practice of this. Overall, pt did appear to have increased insight/awareness into deficits today. We discussed that even if you FEEL you can do something, we should always made safety a priority (I.e. using walker). Pt asked for walker before standing to leave in today's session.   11/29/23: Pt was seen for skilled ST services targeting continued evaluation. See above for CLQT and PROM scores. Pt continues to display decreased awareness of deficits, so SLP gently provided examples of error and discussed how we may fix them. Pt, wife, and SLP collaborated to identify goals for therapy. Goals have been updated to reflect. Throughout today's session, pt was observed to have difficulty with auditory comprehension. He was unable to follow conversation. He was asked a question and would answer with something off topic. He did mention he has always had trouble with finishing tasks, though it has gotten worse.     PATIENT EDUCATION: Education details: SLP role in cognitive-communication Person educated: Patient and Spouse Education method: Explanation Education comprehension: verbalized understanding and needs further education   GOALS: Goals reviewed with patient? No  SHORT TERM GOALS: Target date: 12/23/23  Complete CLQT, PROM, and update goals Baseline: Goal status: MET  2.  Pt will identify the unsafe situation and provide 1 solution  Baseline:  Goal status: INITIAL  3.  Pt  will recall the following steps re: STOP PLAN THINK DO with minA verbal cues Baseline:  Goal status: INITIAL  4.  Pt will recall 2 external memory strategies independently.  Baseline:  Goal status: INITIAL    LONG TERM GOALS: Target date: 01/22/24  Pt/wife will improve score on PROM Baseline:  Goal status: INITIAL  2.  Pt will demonstrate improved safety awareness by acknowledging cognitive impairments Baseline:  Goal status: INITIAL  3.  Pt will verbalize exact steps (meaning) of re: STOP THINK PLAN DO  Baseline:  Goal status: INITIAL  4.  Wife/pt will report successful use of memory strategies at home/in community.  Baseline:  Goal status: INITIAL    ASSESSMENT:  CLINICAL IMPRESSION: Pt is a 73 yo male who presents to ST OP for evaluation with complaints of cognitive changes post surgery. Pt was recently discharged from [facility] in June. Pt and wife endorse re: forgetfulness, difficulty retaining information, difficulty with prospective memory, and  safety/judgement. Pt was assessed using CLQT - to complete next session. SLP observed reduced topic maintenance and perseveration on his art. For example, SLP asked what he feels like he struggles with at home and pt never answered the question, but expressed his interest in [employer]. Pt appeared to have decreased insight. It was suggested that wife attention sessions to assist with carryover of strategy. SLP rec skilled ST services to address cognitive-communication impairment to facilitate return to home. .     OBJECTIVE IMPAIRMENTS: include attention, memory, awareness, and executive functioning. These impairments are limiting patient from managing medications, managing appointments, managing finances, household responsibilities, ADLs/IADLs, and effectively communicating at home and in community. Factors affecting potential to achieve goals and functional outcome are ability to learn/carryover information.. Patient will benefit from  skilled SLP services to address above impairments and improve overall function.  REHAB POTENTIAL: Good with wife support  PLAN:  SLP FREQUENCY: 1-2x/week  SLP DURATION: 8 weeks  PLANNED INTERVENTIONS: Environmental controls, Cueing hierachy, Cognitive reorganization, Internal/external aids, Functional tasks, Multimodal communication approach, SLP instruction and feedback, Compensatory strategies, Patient/family education, and 07492 Treatment of speech (30 or 45 min)     [employer], CCC-SLP 12/13/2023, 12:35 PM

## 2023-12-13 NOTE — Therapy (Unsigned)
 OUTPATIENT OCCUPATIONAL THERAPY NEURO treatment  Patient Name: Andrew Tanner MRN: [id number] DOB:October 30, 1950, 73 y.o., male Today's Date: 12/14/2023  PCP: Dr. Adaline  REFERRING PROVIDER: Dr. Adaline  END OF SESSION:  OT End of Session - 12/13/23 1316     Visit Number 3    Number of Visits 25    Date for OT Re-Evaluation 02/07/24    Authorization Type [employer] Medicare    Authorization - Visit Number 3    Progress Note Due on Visit 10    OT Start Time 1316    OT Stop Time 1357    OT Time Calculation (min) 41 min    Activity Tolerance Patient tolerated treatment well    Behavior During Therapy WFL for tasks assessed/performed           Past Medical History:  Diagnosis Date   Anxiety    Atrial myxoma    Basal cell carcinoma    Cancer (HCC)    basal cell skin CA   Cataract    Depression    Diabetes mellitus without complication (HCC)    Diabetic neuropathy (HCC)    Duodenal adenoma    Hyperlipidemia    Hypertension    IC (interstitial cystitis)    Neuromuscular disorder (HCC) 01/09/21   Peripheral  Neuropathy   Pancreatic lesion    Squamous cell carcinoma of skin    Past Surgical History:  Procedure Laterality Date   BIOPSY  12/05/2022   Procedure: BIOPSY;  Surgeon: Wilhelmenia Aloha Raddle., MD;  Location: [facility];  Service: Gastroenterology;;   CATARACT EXTRACTION, BILATERAL     ESOPHAGOGASTRODUODENOSCOPY N/A 12/05/2022   Procedure: ESOPHAGOGASTRODUODENOSCOPY (EGD);  Surgeon: Wilhelmenia Aloha Raddle., MD;  Location: [facility];  Service: Gastroenterology;  Laterality: N/A;   EUS N/A 12/05/2022   Procedure: UPPER ENDOSCOPIC ULTRASOUND (EUS) RADIAL;  Surgeon: Wilhelmenia Aloha Raddle., MD;  Location: [facility];  Service: Gastroenterology;  Laterality: N/A;   FINE NEEDLE ASPIRATION N/A 12/05/2022   Procedure: FINE NEEDLE ASPIRATION (FNA) LINEAR;  Surgeon: Wilhelmenia Aloha Raddle., MD;  Location: [facility];  Service: Gastroenterology;  Laterality: N/A;   HERNIA  REPAIR N/A    inguinal   NECK SURGERY     ant/post   SPINE SURGERY     SUBMUCOSAL TATTOO INJECTION  12/05/2022   Procedure: SUBMUCOSAL TATTOO INJECTION;  Surgeon: Wilhelmenia Aloha Raddle., MD;  Location: [facility];  Service: Gastroenterology;;   TOTAL HIP ARTHROPLASTY Left 07/13/2023   Procedure: ARTHROPLASTY, HIP, TOTAL, ANTERIOR APPROACH;  Surgeon: Kendal Franky SQUIBB, MD;  Location: [facility];  Service: Orthopedics;  Laterality: Left;  [employer] bipolar Hana table  hemi hip   TRANSURETHRAL RESECTION OF PROSTATE     TURP VAPORIZATION     WHIPPLE PROCEDURE N/A 04/27/2023   Procedure: WHIPPLE PROCEDURE;  Surgeon: Aron Shoulders, MD;  Location: [facility] OR;  Service: General;  Laterality: N/A;   Patient Active Problem List   Diagnosis Date Noted   Atrial myxoma 10/31/2023   History of left hip replacement 08/28/2023   Fracture of femoral neck, left (HCC) 07/13/2023   Protein-calorie malnutrition, severe 05/17/2023   Vomiting and diarrhea 05/13/2023   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   Malnutrition of moderate degree 05/08/2023   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   Poorly controlled type 2 diabetes mellitus with circulatory disorder (HCC) 02/08/2023   Vitamin D  deficiency 07/25/2022   Pain in both feet 02/10/2022   Peripheral neuropathy 01/25/2022   Seborrheic dermatitis 01/25/2022  Positive colorectal cancer screening using Cologuard test 03/16/2020   Chronic RLQ pain 03/16/2020   Type 2 diabetes mellitus with hyperglycemia, without long-term current use of insulin  (HCC) 06/02/2011   Hypertension 06/02/2011   Hyperlipemia 06/02/2011   Cataract 06/02/2011    ONSET DATE: referral 10/02/23  REFERRING DIAG: Generalized weakness, Idiopathy peripheral neuropathy  L hip replacement   THERAPY DIAG:  Other lack of coordination  Other abnormalities of gait and mobility  Muscle weakness (generalized)  Frontal lobe and  executive function deficit  Attention and concentration deficit  Rationale for Evaluation and Treatment: Rehabilitation  SUBJECTIVE:   SUBJECTIVE STATEMENT: Pt reports he has been doing art on the computer Pt accompanied by: significant other  PERTINENT HISTORY:Pt is a 73 y.o. male admitted via EMS 07/12/23 for fall at rehab facility. X-ray showed L femoral neck fx. L THA performed 4/3. 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 Yes: NPRS scale: 3/10 Pain location: LLE Pain description: aching Aggravating factors: malpositioning Relieving factors: repositioning  FALLS: Has patient fallen in last 6 months? Yes. Number of falls multiple  LIVING ENVIRONMENT: Lives with: lives with their family and lives in an adult home, pt is staying in a home with personal care assistance since he can't climb stairs at home Lives in: House/apartment Stairs: No Has following equipment at home: Walker - 4 wheeled and shower chair  PLOF: Independent  PATIENT GOALS: improve independence, move back home  OBJECTIVE:  Note: Objective measures were completed at Evaluation unless otherwise noted.  HAND DOMINANCE: Right  ADLs:has assist for starting shower, pt performs  with supervision Overall ADLs: increased time required Transfers/ambulation related to ADLs: Eating: mod I  UB Dressing: supervision LB Dressing: supervision Toileting: mod I with walker Bathing: supervision Tub Shower transfers: has tub seat, min A Equipment: Shower seat with back  IADLs:Dependent for IADLs  Handwriting: 100% legible  MOBILITY STATUS: supervision     ACTIVITY TOLERANCE: Activity tolerance: decreased overall  endurance   UPPER EXTREMITY ROM:  A/ROM WFLs    UPPER EXTREMITY MMT:     MMT Right eval Left eval  Shoulder flexion 4-/5 4-/5  Shoulder abduction    Shoulder adduction    Shoulder extension    Shoulder internal rotation    Shoulder external rotation    Middle trapezius    Lower trapezius    Elbow flexion 4/5 4/5  Elbow extension 4/5 4/5  Wrist flexion    Wrist extension    Wrist ulnar deviation    Wrist radial deviation    Wrist pronation    Wrist supination    (Blank rows = not tested)  HAND FUNCTION: Grip strength: Right: 75 lbs; Left: 70 lbs  COORDINATION: 9 Hole Peg test: Right: 36.84 sec; Left: 35.46 sec, tremors and increased difficulty  SENSATION: Not tested    COGNITION: Overall cognitive status: decreased short term memory, correctly completes trail making A, unable to correctly complete trail making B- decreased alternating attention    VISION ASSESSMENT:NT, wears glasses Pt  was noted to bump into door frame x 2 with walker, vision to be further assessed within a functional context.     OBSERVATIONS: 12/13/23   11/15/23-Plesant gentleman with complex medical history accompanied by his wife. 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                                                                                                                             TREATMENT DATE: 12/13/23- memory/ cognitve tips education, see pt instructions Pt/ wife were instructed in red theraband HEP, min-mod v.c and demonstration, 10-15 reps each bilateral UE's  12/06/23- Pt/ wife were instructed in HEP for coordination, see pt instructions. Pt practiced printing using foam grip and foam grip was issued for handwriting. Pt demonstrates improved printing with v.c to slow down and form letters deliberately. Pt was cued to rest arms on table for improving tremors. Pt was instructed in flicks. 11/15/23- eval          PATIENT EDUCATION: Education details: red theraband beginning Person educated: Patient and Spouse Education method: Explanation, demonstration, v.c, handout Education comprehension: verbalized understanding, returned demonstration,  HOME EXERCISE PROGRAM: coordiantion, writing 12/13/23- red t band, memory/ cognitve strategies   GOALS: Goals reviewed with patient? Yes  SHORT TERM GOALS: Target date: 12/16/23  I with inital HEP.  Goal status: ongoing  12/13/23 needs reinforcement  2.  Pt will verbalize understanding of memory compensations.  Goal status: issued needs reinforcement 12/13/23  3.  Pt will perform an alternating attention task with 80% accuracy for increased ease with IADLs.  Goal status:  ongoing 12/13/23  4.  Pt will perfrom dynamic activities in standing x 10 mins without LOB for increaed ease with ADLs/IADLs.  Goal status:  ongoing 12/13/23  5.  Pt will perform an organization/ scheduling task with 90% or better accuracy (plan meunu/ grocery list, organizing your day task)  Goal status:  ongoing 12/13/23   LONG TERM GOALS: Target date: 02/07/24  I with updated HEP. Baseline:  Goal status: INITIAL  2.  Pt will perform simple snack or sandwich prep mod I.  Goal status: INITIAL  3.  Pt will perform simple IADL tasks such as folding laundry and washing dishes swith supervision.  Goal status: INITIAL  4.  Pt will navigate a busy environment without bumping into items and will locate items with 90% or better accuracy. Baseline:  Goal status: INITIAL  5.  Pt will demonstrate improved alternating attention as evidenced by completing trailmaking B correctly in 3 mins or less.  Goal status: INITIAL    ASSESSMENT:  CLINICAL IMPRESSION: Pt is progressing towards goals. Pt/ wife verbalize understanding of therabnad HEP and memory strategies. Pt can benefit from reinforcement.SABRAPERFORMANCE DEFICITS: in functional skills including ADLs, IADLs,  coordination, dexterity, strength, pain, flexibility, Fine motor control, Gross motor control, mobility, balance, endurance, decreased knowledge of precautions, decreased knowledge of use of DME, vision, and UE functional use, cognitive skills including attention, memory, problem solving, safety awareness, sequencing, and temperament/personality, and psychosocial skills including coping strategies, environmental adaptation, habits, interpersonal interactions, and routines and behaviors.   IMPAIRMENTS: are limiting patient from ADLs, IADLs,  work, play, leisure, and social participation.   CO-MORBIDITIES: may have co-morbidities  that affects occupational performance. Patient will benefit from skilled OT to address above impairments and improve overall function.  MODIFICATION OR ASSISTANCE TO COMPLETE EVALUATION: No modification of tasks or assist necessary to complete an evaluation.  OT OCCUPATIONAL PROFILE AND HISTORY: Detailed assessment: Review of records and additional review of physical, cognitive, psychosocial history related to current functional performance.  CLINICAL DECISION MAKING: LOW - limited treatment options, no task modification necessary  REHAB POTENTIAL: Good  EVALUATION COMPLEXITY: Low    PLAN:  OT FREQUENCY: 1-2x/week  OT DURATION: 12 weeks  PLANNED INTERVENTIONS: 97168 OT Re-evaluation, 97535 self care/ADL training, 02889 therapeutic exercise, 97530 therapeutic activity, 97112 neuromuscular re-education, 97140 manual therapy, 97035 ultrasound, 97018 paraffin, 02989 moist heat, 97010 cryotherapy, 97129 Cognitive training (first 15 min), 02869 Cognitive training(each additional 15 min), balance training, functional mobility training, visual/perceptual remediation/compensation, psychosocial skills training, energy conservation, coping strategies training, patient/family education, and DME and/or AE instructions  RECOMMENDED OTHER SERVICES: PT, ST  CONSULTED AND AGREED  WITH PLAN OF CARE: Patient and family member/caregiver  PLAN FOR NEXT SESSION:  organizing your day/ scheduling task   Miaisabella Bacorn, OT 12/14/2023, 12:25 PM

## 2023-12-13 NOTE — Patient Instructions (Addendum)
 Resisted Horizontal Abduction: Bilateral   Sit or stand, tubing in both hands, arms out in front. Keeping arms straight, pinch shoulder blades together and stretch arms out. Repeat _10___ times per set. Do _1-2___ sessions per day, every other day.   Elbow Flexion: Resisted   With tubing held in _one_____ hand(s) and other end secured under foot, curl arm up as far as possible. Repeat _10___ times per set. Do _1-2___ sessions per day, every other day.    Elbow Extension: Resisted   Sit in chair with resistive band secured at armrest (or hold with other hand) and ___one____ elbow bent. Straighten elbow. Repeat _10___ times per set.  Do _1-2___ sessions per day, every other day.   Copyright  VHI. All rights reserved.                          Memory Compensation Strategies  Use WARM strategy.  W= write it down  A= associate it  R= repeat it  M= make a mental note  2.   You can keep a Glass blower/designer.  Use a 3-ring notebook with sections for the following: calendar, important names and phone numbers, medications, doctors' names/phone numbers, lists/reminders, and a section to journal what you did  each day.   3.    Use a calendar to write appointments down.  4.    Write yourself a schedule for the day.  This can be placed on the calendar or in a separate section of the Memory Notebook.  Keeping a regular schedule can help memory.  5.    Use medication organizer with sections for each day or morning/evening pills.  You may need help loading it  6.    Keep a basket, or pegboard by the door.  Place items that you need to take out with you in the basket or on the pegboard.  You may also want to  include a message board for reminders.  7.    Use sticky notes.  Place sticky notes with reminders in a place where the task is performed.  For example:  turn off the  stove placed by the stove, lock the door placed on the door at eye  level,  take your medications on  the bathroom mirror or by the place where you normally take your medications.  8.    Use alarms/timers.  Use while cooking to remind yourself to check on food or as a reminder to take your medicine, or as a  reminder to make a call, or as a reminder to perform another task, etc.    Cognitive Tips  Keep a journal/notebook with sections for the following (or use sections separately as needed):  Calendar and appointment sheet, schedule for each day, lists of reminders (such as grocery lists or to do list), homework assignments for therapy, important information such as family and friends names / addresses / phone numbers, medications, medical history and doctors name / phone numbers.  Avoid / remove clutter and unnecessary items from areas such as countertops / cabinets in kitchen and bathroom, closets, etc.  Organize items by purpose.  Baskets and bins help with this.  Leave notes for reminders above task to be completed.  For example: Note to turn off stove over the the stove; note to lock door beside the door, not to brush teeth then wash face by sink, note to take medication on  table etc.  To help recall names of people of people or things, mentally or verbally go through the alphabet to try to determine the 1st letter of the word as this may trigger the name or word you are looking for.  If this is too difficult and someone else knows the word, have them give you the first letter by asking, does it start with an A, B, C etc. (or have them give you the first sound of the word or some other clue).  Review family events, occasions, names, etc.  Pictures are a good way to trigger memory.  Have others correct you if you answer something incorrectly.  Have them speak slowly with a few words to give you time to process and respond.  Don't let others automatically problem-solve. (For example: don't let them automatically lay out clothes in the correct  position, but hand it to you folded so that you can figure it out for yourself.)  However, if you need help with tasks, they should give you as little as they can so that you can be successful.  If appropriate and safe, they may allow you to make mistakes so that you can figure out how to correct the error.  (For example, they may allow you to put your shoes on the wrong foot to see if you notice that is wrong).  If you struggle, they should give you a cue.  (Example: Do your shoes feel right?  Do they look right?)

## 2023-12-14 ENCOUNTER — Encounter: Payer: Self-pay | Admitting: Occupational Therapy

## 2023-12-14 DIAGNOSIS — R350 Frequency of micturition: Secondary | ICD-10-CM | POA: Diagnosis not present

## 2023-12-14 DIAGNOSIS — R3915 Urgency of urination: Secondary | ICD-10-CM | POA: Diagnosis not present

## 2023-12-15 ENCOUNTER — Encounter: Payer: Self-pay | Admitting: Podiatry

## 2023-12-18 ENCOUNTER — Ambulatory Visit

## 2023-12-18 DIAGNOSIS — R2689 Other abnormalities of gait and mobility: Secondary | ICD-10-CM

## 2023-12-18 DIAGNOSIS — R41844 Frontal lobe and executive function deficit: Secondary | ICD-10-CM | POA: Diagnosis not present

## 2023-12-18 DIAGNOSIS — M6281 Muscle weakness (generalized): Secondary | ICD-10-CM | POA: Diagnosis not present

## 2023-12-18 DIAGNOSIS — R41840 Attention and concentration deficit: Secondary | ICD-10-CM | POA: Diagnosis not present

## 2023-12-18 DIAGNOSIS — R278 Other lack of coordination: Secondary | ICD-10-CM | POA: Diagnosis not present

## 2023-12-18 DIAGNOSIS — R296 Repeated falls: Secondary | ICD-10-CM | POA: Diagnosis not present

## 2023-12-18 DIAGNOSIS — R41841 Cognitive communication deficit: Secondary | ICD-10-CM | POA: Diagnosis not present

## 2023-12-18 NOTE — Therapy (Signed)
 OUTPATIENT PHYSICAL THERAPY LOWER EXTREMITY TREATMENT   Patient Name: Andrew Tanner MRN: [id number] DOB:[birth year], 73 y.o., male Today's Date: 12/18/2023  END OF SESSION:  PT End of Session - 12/18/23 1055     Visit Number 3    Date for PT Re-Evaluation 02/13/24    Authorization Type BCBS Medicare    PT Start Time 1055    PT Stop Time 1140    PT Time Calculation (min) 45 min    Equipment Utilized During Treatment Gait belt            Past Medical History:  Diagnosis Date   Anxiety    Atrial myxoma    Basal cell carcinoma    Cancer (HCC)    basal cell skin CA   Cataract    Depression    Diabetes mellitus without complication (HCC)    Diabetic neuropathy (HCC)    Duodenal adenoma    Hyperlipidemia    Hypertension    IC (interstitial cystitis)    Neuromuscular disorder (HCC) 01/09/21   Peripheral  Neuropathy   Pancreatic lesion    Squamous cell carcinoma of skin    Past Surgical History:  Procedure Laterality Date   BIOPSY  12/05/2022   Procedure: BIOPSY;  Surgeon: Wilhelmenia Aloha Raddle., MD;  Location: [facility];  Service: Gastroenterology;;   CATARACT EXTRACTION, BILATERAL     ESOPHAGOGASTRODUODENOSCOPY N/A 12/05/2022   Procedure: ESOPHAGOGASTRODUODENOSCOPY (EGD);  Surgeon: Wilhelmenia Aloha Raddle., MD;  Location: [facility];  Service: Gastroenterology;  Laterality: N/A;   EUS N/A 12/05/2022   Procedure: UPPER ENDOSCOPIC ULTRASOUND (EUS) RADIAL;  Surgeon: Wilhelmenia Aloha Raddle., MD;  Location: [facility];  Service: Gastroenterology;  Laterality: N/A;   FINE NEEDLE ASPIRATION N/A 12/05/2022   Procedure: FINE NEEDLE ASPIRATION (FNA) LINEAR;  Surgeon: Wilhelmenia Aloha Raddle., MD;  Location: [facility];  Service: Gastroenterology;  Laterality: N/A;   HERNIA REPAIR N/A    inguinal   NECK SURGERY     ant/post   SPINE SURGERY     SUBMUCOSAL TATTOO INJECTION  12/05/2022   Procedure: SUBMUCOSAL TATTOO INJECTION;  Surgeon: Wilhelmenia Aloha Raddle., MD;   Location: [facility];  Service: Gastroenterology;;   TOTAL HIP ARTHROPLASTY Left 07/13/2023   Procedure: ARTHROPLASTY, HIP, TOTAL, ANTERIOR APPROACH;  Surgeon: Kendal Franky SQUIBB, MD;  Location: [facility];  Service: Orthopedics;  Laterality: Left;  [employer] bipolar Hana table  hemi hip   TRANSURETHRAL RESECTION OF PROSTATE     TURP VAPORIZATION     WHIPPLE PROCEDURE N/A 04/27/2023   Procedure: WHIPPLE PROCEDURE;  Surgeon: Aron Shoulders, MD;  Location: [facility] OR;  Service: General;  Laterality: N/A;   Patient Active Problem List   Diagnosis Date Noted   Atrial myxoma 10/31/2023   History of left hip replacement 08/28/2023   Fracture of femoral neck, left (HCC) 07/13/2023   Protein-calorie malnutrition, severe 05/17/2023   Vomiting and diarrhea 05/13/2023   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   Malnutrition of moderate degree 05/08/2023   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   Poorly controlled type 2 diabetes mellitus with circulatory disorder (HCC) 02/08/2023   Vitamin D  deficiency 07/25/2022   Pain in both feet 02/10/2022   Peripheral neuropathy 01/25/2022   Seborrheic dermatitis 01/25/2022   Positive colorectal cancer screening using Cologuard test 03/16/2020   Chronic RLQ pain 03/16/2020   Type 2 diabetes mellitus with hyperglycemia, without long-term current use of insulin  (HCC) 06/02/2011   Hypertension 06/02/2011   Hyperlipemia 06/02/2011  Cataract 06/02/2011    PCP: Jenkins Earnie Carrel  REFERRING PROVIDER: Jenkins Earnie Carrel  REFERRING DIAG:  [phone] (ICD-10-CM) - History of left hip replacement  E44.0 (ICD-10-CM) - Malnutrition of moderate degree (HCC)  G60.9 (ICD-10-CM) - Idiopathic peripheral neuropathy  R53.1 (ICD-10-CM) - Generalized weakness    THERAPY DIAG:  Other lack of coordination  Other abnormalities of gait and mobility  Muscle weakness (generalized)  Rationale for Evaluation and Treatment:  Rehabilitation  ONSET DATE: 07/12/23  SUBJECTIVE:   SUBJECTIVE STATEMENT: Doing good, just doing what I can to try to get out of this place I am at.   PERTINENT HISTORY: 11/13/23--73 year old male with history of diabetes, neuropathy, hypertension, hyperlipidemia here for evaluation of memory, cognitive changes and generalized weakness.   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  He was admitted to the hospital for surgical treatment with Whipple procedure.  He was discharged home with couple weeks later.  He was readmitted on 05/13/2023 after increased diarrhea, weakness and falling down at home.  He was admitted for approximately 4 weeks with ongoing vomiting, diarrhea, protein calorie malnutrition, colitis and muscular deconditioning.  This time he was discharged to skilled nursing facility.   06/26/2023 patient fell down at nursing home, hit his head, and went to the emergency room.   07/12/2023 patient fell down again resulting in the left femoral neck fracture requiring admission and surgery. Overall patient has had significant difficulty returning to his baseline.  Wife notes that he is having some ongoing short-term memory difficulty, decreased insight, brain fog issues.  Also continues to be generally deconditioned and weak.  They live in a two-story home with the bedroom and bathroom upstairs, 14 steps, and this remains a challenge for patient to transition from skilled nursing facility back home.   Pt is a 73 y.o. male admitted via EMS 07/12/23 for fall at rehab facility. X-ray showed L femoral neck fx. L THA performed 4/3. 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,  basal cell carcinoma, diabetes mellitus, diabetic neuropathy, neuromuscular disorder, hyperlipidemia, malignant neuroendocrine tumor of duodenum   PAIN:  Are you having pain? Yes: NPRS scale: at worst 9/10 Pain location: feet, my hip sometimes hurts Pain description:  stinging, cold sensation Aggravating factors: worse at end of day Relieving factors: meds, lying down, exercise   PRECAUTIONS: Fall  RED FLAGS: None   WEIGHT BEARING RESTRICTIONS: No  FALLS:  Has patient fallen in last 6 months? Yes. Number of falls no falls in the last 3 months, but at least 12 in the last 6 months   LIVING ENVIRONMENT: Lives with: lives with their spouse Lives in: House/apartment Stairs: Yes: Internal: 14 steps; on right going up and on left going up and External: 2 in garage and 2 to front door steps; can reach both and bilateral but cannot reach both Has following equipment at home: Single point cane and Walker - 2 wheeled  **currently living at a Assisted Living for now**  OCCUPATION: An Tree surgeon   PLOF: Independent, Independent with basic ADLs, and Independent with gait  PATIENT GOALS: to get more independent, get back to my home  NEXT MD VISIT:   OBJECTIVE:  Note: Objective measures were completed at Evaluation unless otherwise noted.  DIAGNOSTIC FINDINGS: Left hip arthroplasty in expected alignment. No periprosthetic lucency or fracture. Recent postsurgical change includes air and edema in the soft tissues. Left hip arthroplasty without immediate postoperative complication.   COGNITION: Overall cognitive status:  Within functional limits for tasks assessed     SENSATION: Neuropathy in B feet    POSTURE: rounded shoulders and forward head   LOWER EXTREMITY ROM: WNL   LOWER EXTREMITY MMT: 4+/5 BLE    FUNCTIONAL TESTS:  5 times sit to stand: 19s  Timed up and go (TUG): 22s  GAIT: Distance walked: in clinic distances Assistive device utilized: Environmental consultant - 2 wheeled and None Level of assistance: Complete Independence and Modified independence Comments: able to complete TUG without walker, slow but is safe                                                                                                                        TREATMENT  DATE: 12/18/23 NuStep L5x4mins  Leg ext 5# 2x10 HS curls 20# 2x10 Taps 4 CGA  Step ups 4 with rails Balance on airex, then with eyes closed  Calf raises 2x10 On airex reaching for numbers  Calf stretch on bar 15s x2 STS holding yellow ball 2x10  12/13/23 NuStep L5x59mins  LAQ 2# 2x10 HS curls green 2x10 Seated march and hip abd with band 2x10 Walking with cane 2 small laps around equipment STS 2x10   11/21/23 EVAL, POC    PATIENT EDUCATION:  Education details: POC, HEP, fall risk Person educated: Patient and Spouse Education method: Medical illustrator Education comprehension: verbalized understanding and returned demonstration  HOME EXERCISE PROGRAM: Access Code: [email or web address] URL: [email or web address] Date: 11/21/2023 Prepared by: Almetta Fam  Exercises - Sit to Stand  - 1 x daily - 7 x weekly - 2 sets - 10 reps - Seated Long Arc Quad  - 1 x daily - 7 x weekly - 2 sets - 10 reps - Standing Hip Abduction with Counter Support  - 1 x daily - 7 x weekly - 2 sets - 10 reps - Standing Hip Extension with Counter Support  - 1 x daily - 7 x weekly - 2 sets - 10 reps - Heel Raises with Counter Support  - 1 x daily - 7 x weekly - 2 sets - 10 reps  ASSESSMENT:  CLINICAL IMPRESSION: Patient doing good, is practicing HEP and walking with cane in his room. He is a bit impulsive and unsafe with the walker at times. Often leaves the walker and tries to take steps without it when getting in and out of machines and transferring. Continued with light strengthening today. Needs multiple cues with step ups for sequencing. His calves and HS are tight. He reports STS are the hardest exercise for him.    Patient is a 73 y.o. male who was seen today for physical therapy evaluation and treatment for weakness. He has an extensive medical history and multiple surgeries that has caused him to lose a lot of weight and muscle mass. His wife reports a number of falls since the  beginning of the year but none in the past 3 months. Currently he is staying at an assisted living to work on  ADLs and increasing his independence. He walks with a walker outside of his home. His wife reports he has a cane, but needs practice on how to use it. Pt reports he does some walking around the house without it. At their home, he has stairs up to the bedroom and bathrooms so would like to practice working on this. He does have neuropathy and reports high levels of pain at times in both feet. Patient will benefit from PT to increase his strength and allow him to improve functional independence and mobility.   OBJECTIVE IMPAIRMENTS: Abnormal gait, decreased activity tolerance, decreased balance, decreased coordination, decreased endurance, difficulty walking, decreased strength, decreased safety awareness, and pain.   ACTIVITY LIMITATIONS: carrying, lifting, squatting, stairs, transfers, and locomotion level  PARTICIPATION LIMITATIONS: meal prep, cleaning, laundry, driving, shopping, community activity, and yard work  PERSONAL FACTORS: Fitness, Past/current experiences, Time since onset of injury/illness/exacerbation, and 3+ comorbidities: istory of diabetes, neuropathy, hypertension, hyperlipidemia here for evaluation of memory, cognitive changes and generalized weakness are also affecting patient's functional outcome.   REHAB POTENTIAL: Good  CLINICAL DECISION MAKING: Stable/uncomplicated  EVALUATION COMPLEXITY: Low  GOALS: Goals reviewed with patient? Yes  SHORT TERM GOALS: Target date: 01/02/24  Patient will be independent with initial HEP. Baseline:  Goal status: MET 12/13/23  2.  Patient will demonstrate improved functional LE strength as demonstrated by 5xSTS <15s. Baseline: 19s Goal status: INITIAL  3.  Patient will be educated on strategies to decrease risk of falls.  Baseline:  Goal status: IN PROGRESS 12/13/23   LONG TERM GOALS: Target date: 02/13/24  Patient will be  independent with advanced/ongoing HEP to improve outcomes and carryover.  Baseline:  Goal status: INITIAL  2.  Patient will be able to ambulate 500' without AD with good safety to access community.  Baseline: short distances without AD Goal status: INITIAL  3.  Patient will be able to navigate a set of stairs with reciprocal pattern for safety inside his home.  Baseline:  Goal status: INITIAL   4.  Patient will demonstrate decreased fall risk by scoring < 14 sec on TUG. Baseline: 22s without walker Goal status: INITIAL  PLAN:  PT FREQUENCY: 2x/week  PT DURATION: 12 weeks  PLANNED INTERVENTIONS: 97110-Therapeutic exercises, 97530- Therapeutic activity, 97112- Neuromuscular re-education, 97535- Self Care, [id number]- Manual therapy, [phone]- Gait training, [phone]- Electrical stimulation (unattended), [id number]- Vasopneumatic device, 20560 (1-2 muscles), 20561 (3+ muscles)- Dry Needling, Patient/Family education, Balance training, Stair training, Joint mobilization, Joint manipulation, Spinal manipulation, Spinal mobilization, Cryotherapy, and Moist heat  PLAN FOR NEXT SESSION: LE strengthening, gait training with cane, functional tasks   [employer], PT 12/18/2023, 11:39 AM

## 2023-12-20 ENCOUNTER — Ambulatory Visit: Admitting: Occupational Therapy

## 2023-12-20 ENCOUNTER — Ambulatory Visit: Admitting: Speech Pathology

## 2023-12-20 ENCOUNTER — Encounter: Payer: Self-pay | Admitting: Speech Pathology

## 2023-12-20 DIAGNOSIS — R278 Other lack of coordination: Secondary | ICD-10-CM | POA: Diagnosis not present

## 2023-12-20 DIAGNOSIS — R2689 Other abnormalities of gait and mobility: Secondary | ICD-10-CM | POA: Diagnosis not present

## 2023-12-20 DIAGNOSIS — R41840 Attention and concentration deficit: Secondary | ICD-10-CM | POA: Diagnosis not present

## 2023-12-20 DIAGNOSIS — R41844 Frontal lobe and executive function deficit: Secondary | ICD-10-CM

## 2023-12-20 DIAGNOSIS — R41841 Cognitive communication deficit: Secondary | ICD-10-CM | POA: Diagnosis not present

## 2023-12-20 DIAGNOSIS — C7A8 Other malignant neuroendocrine tumors: Secondary | ICD-10-CM | POA: Diagnosis not present

## 2023-12-20 DIAGNOSIS — E1140 Type 2 diabetes mellitus with diabetic neuropathy, unspecified: Secondary | ICD-10-CM | POA: Diagnosis not present

## 2023-12-20 DIAGNOSIS — M6281 Muscle weakness (generalized): Secondary | ICD-10-CM | POA: Diagnosis not present

## 2023-12-20 DIAGNOSIS — G709 Myoneural disorder, unspecified: Secondary | ICD-10-CM | POA: Diagnosis not present

## 2023-12-20 DIAGNOSIS — R296 Repeated falls: Secondary | ICD-10-CM | POA: Diagnosis not present

## 2023-12-20 NOTE — Therapy (Unsigned)
 OUTPATIENT SPEECH LANGUAGE PATHOLOGY TREATMENT   Patient Name: Andrew Tanner MRN: [id number] DOB:1950/11/13, 73 y.o., male Today's Date: 12/20/2023  PCP: Wendolyn Jenkins Jansky, MD  REFERRING PROVIDER: Wendolyn Jenkins Jansky, MD  END OF SESSION:  End of Session - 12/20/23 1235     Visit Number 5    Number of Visits 12    Date for SLP Re-Evaluation 01/21/24    SLP Start Time 1230    SLP Stop Time  1310    SLP Time Calculation (min) 40 min    Activity Tolerance Patient tolerated treatment well          Past Medical History:  Diagnosis Date   Anxiety    Atrial myxoma    Basal cell carcinoma    Cancer (HCC)    basal cell skin CA   Cataract    Depression    Diabetes mellitus without complication (HCC)    Diabetic neuropathy (HCC)    Duodenal adenoma    Hyperlipidemia    Hypertension    IC (interstitial cystitis)    Neuromuscular disorder (HCC) 01/09/21   Peripheral  Neuropathy   Pancreatic lesion    Squamous cell carcinoma of skin    Past Surgical History:  Procedure Laterality Date   BIOPSY  12/05/2022   Procedure: BIOPSY;  Surgeon: Wilhelmenia Aloha Raddle., MD;  Location: [facility];  Service: Gastroenterology;;   CATARACT EXTRACTION, BILATERAL     ESOPHAGOGASTRODUODENOSCOPY N/A 12/05/2022   Procedure: ESOPHAGOGASTRODUODENOSCOPY (EGD);  Surgeon: Wilhelmenia Aloha Raddle., MD;  Location: [facility];  Service: Gastroenterology;  Laterality: N/A;   EUS N/A 12/05/2022   Procedure: UPPER ENDOSCOPIC ULTRASOUND (EUS) RADIAL;  Surgeon: Wilhelmenia Aloha Raddle., MD;  Location: [facility];  Service: Gastroenterology;  Laterality: N/A;   FINE NEEDLE ASPIRATION N/A 12/05/2022   Procedure: FINE NEEDLE ASPIRATION (FNA) LINEAR;  Surgeon: Wilhelmenia Aloha Raddle., MD;  Location: [facility];  Service: Gastroenterology;  Laterality: N/A;   HERNIA REPAIR N/A    inguinal   NECK SURGERY     ant/post   SPINE SURGERY     SUBMUCOSAL TATTOO INJECTION  12/05/2022   Procedure: SUBMUCOSAL TATTOO  INJECTION;  Surgeon: Wilhelmenia Aloha Raddle., MD;  Location: [facility];  Service: Gastroenterology;;   TOTAL HIP ARTHROPLASTY Left 07/13/2023   Procedure: ARTHROPLASTY, HIP, TOTAL, ANTERIOR APPROACH;  Surgeon: Kendal Franky SQUIBB, MD;  Location: [facility];  Service: Orthopedics;  Laterality: Left;  [employer] bipolar Hana table  hemi hip   TRANSURETHRAL RESECTION OF PROSTATE     TURP VAPORIZATION     WHIPPLE PROCEDURE N/A 04/27/2023   Procedure: WHIPPLE PROCEDURE;  Surgeon: Aron Shoulders, MD;  Location: [facility] OR;  Service: General;  Laterality: N/A;   Patient Active Problem List   Diagnosis Date Noted   Atrial myxoma 10/31/2023   History of left hip replacement 08/28/2023   Fracture of femoral neck, left (HCC) 07/13/2023   Protein-calorie malnutrition, severe 05/17/2023   Vomiting and diarrhea 05/13/2023   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   Malnutrition of moderate degree 05/08/2023   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   Poorly controlled type 2 diabetes mellitus with circulatory disorder (HCC) 02/08/2023   Vitamin D  deficiency 07/25/2022   Pain in both feet 02/10/2022   Peripheral neuropathy 01/25/2022   Seborrheic dermatitis 01/25/2022   Positive colorectal cancer screening using Cologuard test 03/16/2020   Chronic RLQ pain 03/16/2020   Type 2 diabetes mellitus with hyperglycemia, without long-term current use of insulin  (HCC)  06/02/2011   Hypertension 06/02/2011   Hyperlipemia 06/02/2011   Cataract 06/02/2011    ONSET DATE: Referred on  11/02/23  REFERRING DIAG: None provided; reportedly here for brain fog 2/2 anesthesia post surgeries  THERAPY DIAG:  Cognitive communication deficit  Rationale for Evaluation and Treatment: Rehabilitation  SUBJECTIVE:   SUBJECTIVE STATEMENT:  Pt reports things have been going well.   Pt accompanied by: self and significant other; [name]  PERTINENT HISTORY: Per chart review:  73 year old male with history of diabetes, neuropathy, hypertension, hyperlipidemia here for evaluation of memory, cognitive changes and generalized weakness.   PAIN:  Are you having pain? Yes; 1/10 (soreness); neuropathy   FALLS: Has patient fallen in last 6 months?  Yes, Number of falls: 4   LIVING ENVIRONMENT: Lives with: lives in an adult home; [facility] Lives in: Group home; hoping to return home   *under Long term care   PLOF:  Level of assistance: Needed assistance with ADLs, Needed assistance with IADLS; two surgeries (whipple 04/2023 and hip 07/2023)  Employment: Retired; full Control and instrumentation engineer   PATIENT GOALS: to return back home  OBJECTIVE:  Note: Objective measures were completed at Evaluation unless otherwise noted.  DIAGNOSTIC FINDINGS: Per EMR  CT Head Wo Contrast  IMPRESSION: No CT evidence of acute intracranial abnormality.   Small right frontal scalp hematoma.   Unchanged pituitary/sellar mass.   No acute fracture or traumatic malalignment in the cervical spine.   Degenerative changes and postsurgical changes as above.   Similar appearance of left thyroid  nodule. Reiterate recommendation for thyroid  ultrasound if not previously performed.     Electronically Signed   By: Donnice Mania M.D.   On: 06/26/2023 09:33  COGNITION: Overall cognitive status: Impaired Areas of impairment:  Attention: Impaired: Selective, Alternating, Divided Memory: Impaired: Working Teacher, music term Prospective Auditory Awareness: Impaired: Intellectual Executive function: Impaired: Problem solving, Organization, Planning, Error awareness, Self-correction, and Slow processing Functional deficits: Poor safety awareness; aware of some deficits, but not of all. Wife reports more difficulty than pt shares.   COGNITIVE COMMUNICATION: Following directions: Follows multi-step commands inconsistently  Auditory comprehension: Impaired: 2/2 to attention/working memory Verbal  expression: WFL Functional communication: Impaired: see clinical impression  ORAL MOTOR EXAMINATION: Overall status: Did not assess Comments: NA  STANDARDIZED ASSESSMENTS:   Cognitive Linguistic Quick Test: AGE - 18 - 69   The Cognitive Linguistic Quick Test (CLQT) was administered to assess the relative status of five cognitive domains: attention, memory, language, executive functioning, and visuospatial skills. Scores from 10 tasks were used to estimate severity ratings (standardized for age groups 18-69 years and 70-89 years) for each domain, a clock drawing task, as well as an overall composite severity rating of cognition.       Task Score Criterion Cut Scores  Personal Facts 8/8 8  Symbol Cancellation 11/12 11  Confrontation Naming 10/10 10  Clock Drawing  12/13 12  Story Retelling 5/10 6  Symbol Trails 6/10 9  Generative Naming 4/9 5  Design Memory 4/6 5  Mazes  4/8 7  Design Generation 5/13 6     PATIENT REPORTED OUTCOME MEASURES (PROM): Neuro-QOL Cognitive Function:    Alm: 101/140   [name]: 69/130 (didn't answer 2 questions)  *wife scored pt more severely than pt scored himself.  TREATMENT DATE:   12/20/23: Pt was seen for skilled ST services targeting cognitive-communication. Pt reports he has been developing a system for him to keep track of tasks. Wife took his schedule and created a personal calendar for him. Both pt and wife feel his is making gains in cognition and taking safety more seriously. Pt completed problem solving HEP (~20 questions) with 1 error. He did have some difficulty with cognitive flexibility - finding two solutions to a single problem. SLP educated on the importance of managing fatigue. SLP assisted pt in establishing an organization system for his notebook. Pt was mixing to-do lists with a running commentary of notes. SLP  encouraged pt to use titles for each section re: routine, to-dos, and notes; as well as, date his notes at the top of the page.   12/13/23: Pt was seen for skilled ST services targeting cognitive-communication. SLP asked pt to review previous session. Pt was able to recall 2 steps of Stop-Think-Plan-Do - but he does feel like he has been trying to pause and think. Wife reports she feels like Xavion has been making more focused effort to slow down. Pt is showing increased awareness of impairments and is providing examples of where he needs to be mindful re: looking for body language of when someone is going to move to help anticipate. He also reports he has begun writing things down to help support memory. SLP facilitated session by asking pt safety problem solving questions. Pt required overall minA to complete. Provided for HEP. Cont with current POC.   12/06/23: Pt was seen for skilled ST services targeting cognitive-communication. SLP initiated education on Stop, Think, Plan, Do to reduce impulsivity. SLP used personally-relevant example re: before going to get the mail. Pt perseverated on him falling on the ice one time when attempting to get the mail, and required gentle prompting to return to subject. SLP provided pt with a few safety scenarios to see how he answered each question. Pt would benefit from more practice of this. Overall, pt did appear to have increased insight/awareness into deficits today. We discussed that even if you FEEL you can do something, we should always made safety a priority (I.e. using walker). Pt asked for walker before standing to leave in today's session.   11/29/23: Pt was seen for skilled ST services targeting continued evaluation. See above for CLQT and PROM scores. Pt continues to display decreased awareness of deficits, so SLP gently provided examples of error and discussed how we may fix them. Pt, wife, and SLP collaborated to identify goals for therapy. Goals have been  updated to reflect. Throughout today's session, pt was observed to have difficulty with auditory comprehension. He was unable to follow conversation. He was asked a question and would answer with something off topic. He did mention he has always had trouble with finishing tasks, though it has gotten worse.     PATIENT EDUCATION: Education details: SLP role in cognitive-communication Person educated: Patient and Spouse Education method: Explanation Education comprehension: verbalized understanding and needs further education   GOALS: Goals reviewed with patient? No  SHORT TERM GOALS: Target date: 12/23/23  Complete CLQT, PROM, and update goals Baseline: Goal status: MET  2.  Pt will identify the unsafe situation and provide 1 solution  Baseline:  Goal status: INITIAL  3.  Pt will recall the following steps re: STOP PLAN THINK DO with minA verbal cues Baseline:  Goal status: INITIAL  4.  Pt will recall 2 external memory strategies  independently.  Baseline:  Goal status: INITIAL    LONG TERM GOALS: Target date: 01/22/24  Pt/wife will improve score on PROM Baseline:  Goal status: INITIAL  2.  Pt will demonstrate improved safety awareness by acknowledging cognitive impairments Baseline:  Goal status: INITIAL  3.  Pt will verbalize exact steps (meaning) of re: STOP THINK PLAN DO  Baseline:  Goal status: INITIAL  4.  Wife/pt will report successful use of memory strategies at home/in community.  Baseline:  Goal status: INITIAL    ASSESSMENT:  CLINICAL IMPRESSION: Pt is a 73 yo male who presents to ST OP for evaluation with complaints of cognitive changes post surgery. Pt was recently discharged from [facility] in June. Pt and wife endorse re: forgetfulness, difficulty retaining information, difficulty with prospective memory, and safety/judgement. Pt was assessed using CLQT - to complete next session. SLP observed reduced topic maintenance and perseveration on his art. For  example, SLP asked what he feels like he struggles with at home and pt never answered the question, but expressed his interest in [employer]. Pt appeared to have decreased insight. It was suggested that wife attention sessions to assist with carryover of strategy. SLP rec skilled ST services to address cognitive-communication impairment to facilitate return to home. .     OBJECTIVE IMPAIRMENTS: include attention, memory, awareness, and executive functioning. These impairments are limiting patient from managing medications, managing appointments, managing finances, household responsibilities, ADLs/IADLs, and effectively communicating at home and in community. Factors affecting potential to achieve goals and functional outcome are ability to learn/carryover information.. Patient will benefit from skilled SLP services to address above impairments and improve overall function.  REHAB POTENTIAL: Good with wife support  PLAN:  SLP FREQUENCY: 1-2x/week  SLP DURATION: 8 weeks  PLANNED INTERVENTIONS: Environmental controls, Cueing hierachy, Cognitive reorganization, Internal/external aids, Functional tasks, Multimodal communication approach, SLP instruction and feedback, Compensatory strategies, Patient/family education, and [id number] Treatment of speech (30 or 45 min)     [employer], CCC-SLP 12/20/2023, 12:36 PM

## 2023-12-20 NOTE — Therapy (Signed)
 OUTPATIENT OCCUPATIONAL THERAPY NEURO treatment  Patient Name: Andrew Tanner MRN: [id number] DOB:Apr 17, 1950, 73 y.o., male Today's Date: 12/20/2023  PCP: Dr. Adaline  REFERRING PROVIDER: Dr. Adaline  END OF SESSION:  OT End of Session - 12/20/23 1335     Visit Number 4    Number of Visits 25    Date for OT Re-Evaluation 02/07/24    Authorization Type [employer] Medicare    Authorization - Visit Number 4    Progress Note Due on Visit 10    OT Start Time 1315    OT Stop Time 1400    OT Time Calculation (min) 45 min           Past Medical History:  Diagnosis Date   Anxiety    Atrial myxoma    Basal cell carcinoma    Cancer (HCC)    basal cell skin CA   Cataract    Depression    Diabetes mellitus without complication (HCC)    Diabetic neuropathy (HCC)    Duodenal adenoma    Hyperlipidemia    Hypertension    IC (interstitial cystitis)    Neuromuscular disorder (HCC) 01/09/21   Peripheral  Neuropathy   Pancreatic lesion    Squamous cell carcinoma of skin    Past Surgical History:  Procedure Laterality Date   BIOPSY  12/05/2022   Procedure: BIOPSY;  Surgeon: Wilhelmenia Aloha Raddle., MD;  Location: [facility];  Service: Gastroenterology;;   CATARACT EXTRACTION, BILATERAL     ESOPHAGOGASTRODUODENOSCOPY N/A 12/05/2022   Procedure: ESOPHAGOGASTRODUODENOSCOPY (EGD);  Surgeon: Wilhelmenia Aloha Raddle., MD;  Location: [facility];  Service: Gastroenterology;  Laterality: N/A;   EUS N/A 12/05/2022   Procedure: UPPER ENDOSCOPIC ULTRASOUND (EUS) RADIAL;  Surgeon: Wilhelmenia Aloha Raddle., MD;  Location: [facility];  Service: Gastroenterology;  Laterality: N/A;   FINE NEEDLE ASPIRATION N/A 12/05/2022   Procedure: FINE NEEDLE ASPIRATION (FNA) LINEAR;  Surgeon: Wilhelmenia Aloha Raddle., MD;  Location: [facility];  Service: Gastroenterology;  Laterality: N/A;   HERNIA REPAIR N/A    inguinal   NECK SURGERY     ant/post   SPINE SURGERY     SUBMUCOSAL TATTOO INJECTION  12/05/2022    Procedure: SUBMUCOSAL TATTOO INJECTION;  Surgeon: Wilhelmenia Aloha Raddle., MD;  Location: [facility];  Service: Gastroenterology;;   TOTAL HIP ARTHROPLASTY Left 07/13/2023   Procedure: ARTHROPLASTY, HIP, TOTAL, ANTERIOR APPROACH;  Surgeon: Kendal Franky SQUIBB, MD;  Location: [facility];  Service: Orthopedics;  Laterality: Left;  [employer] bipolar Hana table  hemi hip   TRANSURETHRAL RESECTION OF PROSTATE     TURP VAPORIZATION     WHIPPLE PROCEDURE N/A 04/27/2023   Procedure: WHIPPLE PROCEDURE;  Surgeon: Aron Shoulders, MD;  Location: [facility] OR;  Service: General;  Laterality: N/A;   Patient Active Problem List   Diagnosis Date Noted   Atrial myxoma 10/31/2023   History of left hip replacement 08/28/2023   Fracture of femoral neck, left (HCC) 07/13/2023   Protein-calorie malnutrition, severe 05/17/2023   Vomiting and diarrhea 05/13/2023   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   Malnutrition of moderate degree 05/08/2023   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   Poorly controlled type 2 diabetes mellitus with circulatory disorder (HCC) 02/08/2023   Vitamin D  deficiency 07/25/2022   Pain in both feet 02/10/2022   Peripheral neuropathy 01/25/2022   Seborrheic dermatitis 01/25/2022   Positive colorectal cancer screening using Cologuard test 03/16/2020   Chronic RLQ pain 03/16/2020   Type 2  diabetes mellitus with hyperglycemia, without long-term current use of insulin  (HCC) 06/02/2011   Hypertension 06/02/2011   Hyperlipemia 06/02/2011   Cataract 06/02/2011    ONSET DATE: referral 10/02/23  REFERRING DIAG: Generalized weakness, Idiopathy peripheral neuropathy  L hip replacement   THERAPY DIAG:  Other lack of coordination  Other abnormalities of gait and mobility  Muscle weakness (generalized)  Frontal lobe and executive function deficit  Attention and concentration deficit  Rationale for Evaluation and Treatment:  Rehabilitation  SUBJECTIVE:   SUBJECTIVE STATEMENT: Pt reports he has been doing art on the computer Pt accompanied by: significant other  PERTINENT HISTORY:Pt is a 73 y.o. male admitted via EMS 07/12/23 for fall at rehab facility. X-ray showed L femoral neck fx. L THA performed 4/3. 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 Yes: NPRS scale: 3/10 Pain location: LLE Pain description: aching Aggravating factors: malpositioning Relieving factors: repositioning  FALLS: Has patient fallen in last 6 months? Yes. Number of falls multiple  LIVING ENVIRONMENT: Lives with: lives with their family and lives in an adult home, pt is staying in a home with personal care assistance since he can't climb stairs at home Lives in: House/apartment Stairs: No Has following equipment at home: Walker - 4 wheeled and shower chair  PLOF: Independent  PATIENT GOALS: improve independence, move back home  OBJECTIVE:  Note: Objective measures were completed at Evaluation unless otherwise noted.  HAND DOMINANCE: Right  ADLs:has assist for starting shower, pt performs  with supervision Overall ADLs: increased time required Transfers/ambulation related to ADLs: Eating: mod I  UB Dressing: supervision LB Dressing: supervision Toileting: mod I with walker Bathing: supervision Tub Shower transfers: has tub seat, min A Equipment: Shower seat with back  IADLs:Dependent for IADLs  Handwriting: 100% legible  MOBILITY STATUS: supervision     ACTIVITY TOLERANCE: Activity tolerance: decreased overall endurance   UPPER EXTREMITY ROM:  A/ROM WFLs    UPPER EXTREMITY MMT:     MMT Right eval Left eval   Shoulder flexion 4-/5 4-/5  Shoulder abduction    Shoulder adduction    Shoulder extension    Shoulder internal rotation    Shoulder external rotation    Middle trapezius    Lower trapezius    Elbow flexion 4/5 4/5  Elbow extension 4/5 4/5  Wrist flexion    Wrist extension    Wrist ulnar deviation    Wrist radial deviation    Wrist pronation    Wrist supination    (Blank rows = not tested)  HAND FUNCTION: Grip strength: Right: 75 lbs; Left: 70 lbs  COORDINATION: 9 Hole Peg test: Right: 36.84 sec; Left: 35.46 sec, tremors and increased difficulty  SENSATION: Not tested    COGNITION: Overall cognitive status: decreased short term memory, correctly completes trail making A, unable to correctly complete trail making B- decreased alternating attention    VISION ASSESSMENT:NT, wears glasses Pt  was noted to bump into door frame x 2 with walker, vision to be further assessed within a functional context.     OBSERVATIONS:    11/15/23-Plesant gentleman with complex medical history accompanied by his wife. Pt is and artist.Pt is living in an adult care home since he can't navigate the stairs at home.  TREATMENT DATE: 12/20/23- Discussed activities pt can begin performing to maximize I with ADLs/IADLs. (Pt has been very focused on his artwork and is doing well with the artwork). Pt to start performing laundry at his group home and possibly simple cooking with supervision. Pt simulated carrying pillowcase full od dirty laundry to and from target and unloading with supervision. Pt wrote out steps involved in perfroming laundry to organize task. Pt wrote out steps however it was initally for what he would do in his home.Pt is currently living in a group home and situation would be different. Pt then wrote out steps he would need to do to perfrom task at group  home. Pt omitted several steps and required mod v.c to add aditional steps. Pt to write out organized list for home work.  Standing at countertop to perfrom dynamic functional reaching with left and right UE's with close supervision and min v.c  12/13/23- memory/ cognitve tips education, see pt instructions Pt/ wife were instructed in red theraband HEP, min-mod v.c and demonstration, 10-15 reps each bilateral UE's  12/06/23- Pt/ wife were instructed in HEP for coordination, see pt instructions. Pt practiced printing using foam grip and foam grip was issued for handwriting. Pt demonstrates improved printing with v.c to slow down and form letters deliberately. Pt was cued to rest arms on table for improving tremors. Pt was instructed in flicks. 11/15/23- eval         PATIENT EDUCATION: Education details: red theraband beginning Person educated: Patient and Spouse Education method: Explanation, demonstration, v.c, handout Education comprehension: verbalized understanding, returned demonstration,  HOME EXERCISE PROGRAM: coordiantion, writing 12/13/23- red t band, memory/ cognitve strategies   GOALS: Goals reviewed with patient? Yes  SHORT TERM GOALS: Target date: 12/16/23  I with inital HEP.  Goal status: ongoing  12/20/23 needs reinforcement  2.  Pt will verbalize understanding of memory compensations.  Goal status: issued needs reinforcement 12/20/23  3.  Pt will perform an alternating attention task with 80% accuracy for increased ease with IADLs.  Goal status:  ongoing 12/20/23  4.  Pt will perfrom dynamic activities in standing x 10 mins without LOB for increaed ease with ADLs/IADLs.  Goal status:  ongoing 12/13/23  5.  Pt will perform an organization/ scheduling task with 90% or better accuracy (plan meunu/ grocery list, organizing your day task)  Goal status:  ongoing 12/20/23- mod v.c for organization task today    LONG TERM GOALS: Target date: 02/07/24  I with updated  HEP. Baseline:  Goal status: INITIAL  2.  Pt will perform simple snack or sandwich prep mod I.  Goal status: INITIAL  3.  Pt will perform simple IADL tasks such as folding laundry and washing dishes swith supervision.  Goal status: INITIAL  4.  Pt will navigate a busy environment without bumping into items and will locate items with 90% or better accuracy. Baseline:  Goal status: INITIAL  5.  Pt will demonstrate improved alternating attention as evidenced by completing trailmaking B correctly in 3 mins or less.  Goal status: INITIAL    ASSESSMENT:  CLINICAL IMPRESSION: Pt is progressing towards goals. Pt/ wife verbalize understanding of therabnad HEP and memory strategies. Pt can benefit from reinforcement.SABRAPERFORMANCE DEFICITS: in functional skills including ADLs, IADLs, coordination, dexterity, strength, pain, flexibility, Fine motor control, Gross motor control, mobility, balance, endurance, decreased knowledge of precautions, decreased knowledge of use of DME, vision, and UE functional use, cognitive skills including attention, memory, problem solving, safety awareness, sequencing, and  temperament/personality, and psychosocial skills including coping strategies, environmental adaptation, habits, interpersonal interactions, and routines and behaviors.   IMPAIRMENTS: are limiting patient from ADLs, IADLs, work, play, leisure, and social participation.   CO-MORBIDITIES: may have co-morbidities  that affects occupational performance. Patient will benefit from skilled OT to address above impairments and improve overall function.  MODIFICATION OR ASSISTANCE TO COMPLETE EVALUATION: No modification of tasks or assist necessary to complete an evaluation.  OT OCCUPATIONAL PROFILE AND HISTORY: Detailed assessment: Review of records and additional review of physical, cognitive, psychosocial history related to current functional performance.  CLINICAL DECISION MAKING: LOW - limited  treatment options, no task modification necessary  REHAB POTENTIAL: Good  EVALUATION COMPLEXITY: Low    PLAN:  OT FREQUENCY: 1-2x/week  OT DURATION: 12 weeks  PLANNED INTERVENTIONS: 97168 OT Re-evaluation, 97535 self care/ADL training, 02889 therapeutic exercise, 97530 therapeutic activity, 97112 neuromuscular re-education, 97140 manual therapy, 97035 ultrasound, 97018 paraffin, 02989 moist heat, 97010 cryotherapy, 97129 Cognitive training (first 15 min), 02869 Cognitive training(each additional 15 min), balance training, functional mobility training, visual/perceptual remediation/compensation, psychosocial skills training, energy conservation, coping strategies training, patient/family education, and DME and/or AE instructions  RECOMMENDED OTHER SERVICES: PT, ST  CONSULTED AND AGREED WITH PLAN OF CARE: Patient and family member/caregiver  PLAN FOR NEXT SESSION:   check organization task, IADLS, simulated bathing   Baldemar Dady, OT 12/20/2023, 1:37 PM

## 2023-12-21 ENCOUNTER — Other Ambulatory Visit (HOSPITAL_COMMUNITY): Payer: Self-pay

## 2023-12-21 DIAGNOSIS — R350 Frequency of micturition: Secondary | ICD-10-CM | POA: Diagnosis not present

## 2023-12-21 DIAGNOSIS — R3915 Urgency of urination: Secondary | ICD-10-CM | POA: Diagnosis not present

## 2023-12-26 ENCOUNTER — Ambulatory Visit: Admitting: Physical Therapy

## 2023-12-26 ENCOUNTER — Ambulatory Visit: Admitting: Occupational Therapy

## 2023-12-26 ENCOUNTER — Encounter: Payer: Self-pay | Admitting: Physical Therapy

## 2023-12-26 DIAGNOSIS — M6281 Muscle weakness (generalized): Secondary | ICD-10-CM

## 2023-12-26 DIAGNOSIS — R296 Repeated falls: Secondary | ICD-10-CM | POA: Diagnosis not present

## 2023-12-26 DIAGNOSIS — R278 Other lack of coordination: Secondary | ICD-10-CM

## 2023-12-26 DIAGNOSIS — R2689 Other abnormalities of gait and mobility: Secondary | ICD-10-CM | POA: Diagnosis not present

## 2023-12-26 DIAGNOSIS — R41840 Attention and concentration deficit: Secondary | ICD-10-CM

## 2023-12-26 DIAGNOSIS — R41844 Frontal lobe and executive function deficit: Secondary | ICD-10-CM

## 2023-12-26 DIAGNOSIS — R41841 Cognitive communication deficit: Secondary | ICD-10-CM | POA: Diagnosis not present

## 2023-12-26 NOTE — Therapy (Signed)
 OUTPATIENT OCCUPATIONAL THERAPY NEURO treatment  Patient Name: Andrew Tanner MRN: [id number] DOB:May 23, 1950, 73 y.o., male Today's Date: 12/26/2023  PCP: Dr. Adaline  REFERRING PROVIDER: Dr. Adaline  END OF SESSION:  OT End of Session - 12/26/23 1553     Visit Number 5    Number of Visits 25    Date for OT Re-Evaluation 02/07/24    Authorization Type [employer] Medicare    Authorization - Visit Number 5    Progress Note Due on Visit 10    OT Start Time 1535    OT Stop Time 1620    OT Time Calculation (min) 45 min    Activity Tolerance Patient tolerated treatment well    Behavior During Therapy WFL for tasks assessed/performed            Past Medical History:  Diagnosis Date   Anxiety    Atrial myxoma    Basal cell carcinoma    Cancer (HCC)    basal cell skin CA   Cataract    Depression    Diabetes mellitus without complication (HCC)    Diabetic neuropathy (HCC)    Duodenal adenoma    Hyperlipidemia    Hypertension    IC (interstitial cystitis)    Neuromuscular disorder (HCC) 01/09/21   Peripheral  Neuropathy   Pancreatic lesion    Squamous cell carcinoma of skin    Past Surgical History:  Procedure Laterality Date   BIOPSY  12/05/2022   Procedure: BIOPSY;  Surgeon: Wilhelmenia Aloha Raddle., MD;  Location: [facility];  Service: Gastroenterology;;   CATARACT EXTRACTION, BILATERAL     ESOPHAGOGASTRODUODENOSCOPY N/A 12/05/2022   Procedure: ESOPHAGOGASTRODUODENOSCOPY (EGD);  Surgeon: Wilhelmenia Aloha Raddle., MD;  Location: [facility];  Service: Gastroenterology;  Laterality: N/A;   EUS N/A 12/05/2022   Procedure: UPPER ENDOSCOPIC ULTRASOUND (EUS) RADIAL;  Surgeon: Wilhelmenia Aloha Raddle., MD;  Location: [facility];  Service: Gastroenterology;  Laterality: N/A;   FINE NEEDLE ASPIRATION N/A 12/05/2022   Procedure: FINE NEEDLE ASPIRATION (FNA) LINEAR;  Surgeon: Wilhelmenia Aloha Raddle., MD;  Location: [facility];  Service: Gastroenterology;  Laterality: N/A;    HERNIA REPAIR N/A    inguinal   NECK SURGERY     ant/post   SPINE SURGERY     SUBMUCOSAL TATTOO INJECTION  12/05/2022   Procedure: SUBMUCOSAL TATTOO INJECTION;  Surgeon: Wilhelmenia Aloha Raddle., MD;  Location: [facility];  Service: Gastroenterology;;   TOTAL HIP ARTHROPLASTY Left 07/13/2023   Procedure: ARTHROPLASTY, HIP, TOTAL, ANTERIOR APPROACH;  Surgeon: Kendal Franky SQUIBB, MD;  Location: [facility];  Service: Orthopedics;  Laterality: Left;  [employer] bipolar Hana table  hemi hip   TRANSURETHRAL RESECTION OF PROSTATE     TURP VAPORIZATION     WHIPPLE PROCEDURE N/A 04/27/2023   Procedure: WHIPPLE PROCEDURE;  Surgeon: Aron Shoulders, MD;  Location: [facility] OR;  Service: General;  Laterality: N/A;   Patient Active Problem List   Diagnosis Date Noted   Atrial myxoma 10/31/2023   History of left hip replacement 08/28/2023   Fracture of femoral neck, left (HCC) 07/13/2023   Protein-calorie malnutrition, severe 05/17/2023   Vomiting and diarrhea 05/13/2023   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   Malnutrition of moderate degree 05/08/2023   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   Poorly controlled type 2 diabetes mellitus with circulatory disorder (HCC) 02/08/2023   Vitamin D  deficiency 07/25/2022   Pain in both feet 02/10/2022   Peripheral neuropathy 01/25/2022   Seborrheic dermatitis 01/25/2022  Positive colorectal cancer screening using Cologuard test 03/16/2020   Chronic RLQ pain 03/16/2020   Type 2 diabetes mellitus with hyperglycemia, without long-term current use of insulin  (HCC) 06/02/2011   Hypertension 06/02/2011   Hyperlipemia 06/02/2011   Cataract 06/02/2011    ONSET DATE: referral 10/02/23  REFERRING DIAG: Generalized weakness, Idiopathy peripheral neuropathy  L hip replacement   THERAPY DIAG:  Other lack of coordination  Other abnormalities of gait and mobility  Muscle weakness (generalized)  Frontal lobe and  executive function deficit  Attention and concentration deficit  Rationale for Evaluation and Treatment: Rehabilitation  SUBJECTIVE:   SUBJECTIVE STATEMENT: Pt reports he has done several loads of wash Pt accompanied by: significant other  PERTINENT HISTORY:Pt is a 73 y.o. male admitted via EMS 07/12/23 for fall at rehab facility. X-ray showed L femoral neck fx. L THA performed 4/3. 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 Yes: NPRS scale: 3/10 Pain location: LLE Pain description: aching Aggravating factors: malpositioning Relieving factors: repositioning  FALLS: Has patient fallen in last 6 months? Yes. Number of falls multiple  LIVING ENVIRONMENT: Lives with: lives with their family and lives in an adult home, pt is staying in a home with personal care assistance since he can't climb stairs at home Lives in: House/apartment Stairs: No Has following equipment at home: Walker - 4 wheeled and shower chair  PLOF: Independent  PATIENT GOALS: improve independence, move back home  OBJECTIVE:  Note: Objective measures were completed at Evaluation unless otherwise noted.  HAND DOMINANCE: Right  ADLs:has assist for starting shower, pt performs  with supervision Overall ADLs: increased time required Transfers/ambulation related to ADLs: Eating: mod I  UB Dressing: supervision LB Dressing: supervision Toileting: mod I with walker Bathing: supervision Tub Shower transfers: has tub seat, min A Equipment: Shower seat with back  IADLs:Dependent for IADLs  Handwriting: 100% legible  MOBILITY STATUS: supervision     ACTIVITY TOLERANCE: Activity tolerance: decreased overall  endurance   UPPER EXTREMITY ROM:  A/ROM WFLs    UPPER EXTREMITY MMT:     MMT Right eval Left eval  Shoulder flexion 4-/5 4-/5  Shoulder abduction    Shoulder adduction    Shoulder extension    Shoulder internal rotation    Shoulder external rotation    Middle trapezius    Lower trapezius    Elbow flexion 4/5 4/5  Elbow extension 4/5 4/5  Wrist flexion    Wrist extension    Wrist ulnar deviation    Wrist radial deviation    Wrist pronation    Wrist supination    (Blank rows = not tested)  HAND FUNCTION: Grip strength: Right: 75 lbs; Left: 70 lbs  COORDINATION: 9 Hole Peg test: Right: 36.84 sec; Left: 35.46 sec, tremors and increased difficulty  SENSATION: Not tested    COGNITION: Overall cognitive status: decreased short term memory, correctly completes trail making A, unable to correctly complete trail making B- decreased alternating attention    VISION ASSESSMENT:NT, wears glasses Pt  was noted to bump into door frame x 2 with walker, vision to be further assessed within a functional context.     OBSERVATIONS:  11/15/23-Plesant gentleman with complex medical history accompanied by his wife. Pt is and artist.Pt is living in an adult  care home since he can't navigate the stairs at home.                                                                                                                             TREATMENT DATE: 12/26/23 UBE x 6 mins level 3 for conditioning  Pt reports he has washed 3 loads of clothing since last visit and has made his bed initally with supervision. Pt generated a list of steps involved in making an egg sandwich. Therapist provided recommendations. Pt initally only wrote out the steps involved in making an egg, min-mod v.c to add additional details and complete task. Therapist recommended pt gathers all items prior to starting task. Therpist discussed safety with task and need for supervision initally. Discussed strategies for  thoroughness with bathing and using a system with pt/ wife. Pt's wife to discuss with caregiver. Reveiwed red theraband HEP, mod v.c 10-15 reps each.   12/20/23- Discussed activities pt can begin performing to maximize I with ADLs/IADLs. (Pt has been very focused on his artwork and is doing well with the artwork). Pt to start performing laundry at his group home and possibly simple cooking with supervision. Pt simulated carrying pillowcase full of dirty laundry to and from target and unloading with supervision. Pt wrote out steps involved in perfroming laundry to organize task. Pt wrote out steps however it was initally for what he would do in his home.Pt is currently living in a group home and situation would be different. Pt then wrote out steps he would need to do to perfrom task at group home. Pt omitted several steps and required mod v.c to add aditional steps. Pt to write out organized list for home work.  Standing at countertop to perfrom dynamic functional reaching with left and right UE's with close supervision and min v.c  12/13/23- memory/ cognitve tips education, see pt instructions Pt/ wife were instructed in red theraband HEP, min-mod v.c and demonstration, 10-15 reps each bilateral UE's  12/06/23- Pt/ wife were instructed in HEP for coordination, see pt instructions. Pt practiced printing using foam grip and foam grip was issued for handwriting. Pt demonstrates improved printing with v.c to slow down and form letters deliberately. Pt was cued to rest arms on table for improving tremors. Pt was instructed in flicks. 11/15/23- eval         PATIENT EDUCATION: Education details:  see above Person educated: Patient and Spouse Education method: Explanation, demonstration, v.c, handout Education comprehension: verbalized understanding, returned demonstration,  HOME EXERCISE PROGRAM: coordiantion, writing 12/13/23- red t band, memory/ cognitve strategies   GOALS: Goals reviewed with  patient? Yes  SHORT TERM GOALS: Target date: 12/16/23  I with inital HEP.  Goal status: ongoing  12/20/23 needs reinforcement  2.  Pt will verbalize understanding of memory compensations.  Goal status: issued needs reinforcement 12/20/23  3.  Pt will perform an alternating attention task with 80% accuracy for increased ease with IADLs.  Goal status:  ongoing 12/20/23  4.  Pt will perfrom dynamic activities in standing x 10 mins without LOB for increaed ease with ADLs/IADLs.  Goal status:  ongoing 12/13/23  5.  Pt will perform an organization/ scheduling task with 90% or better accuracy (plan meunu/ grocery list, organizing your day task)  Goal status:  ongoing 12/26/23- min-mod v.c for organization task today    LONG TERM GOALS: Target date: 02/07/24  I with updated HEP. Baseline:  Goal status: INITIAL  2.  Pt will perform simple snack or sandwich prep mod I.  Goal status: INITIAL  3.  Pt will perform simple IADL tasks such as folding laundry and washing dishes swith supervision.  Goal status: INITIAL  4.  Pt will navigate a busy environment without bumping into items and will locate items with 90% or better accuracy. Baseline:  Goal status: INITIAL  5.  Pt will demonstrate improved alternating attention as evidenced by completing trailmaking B correctly in 3 mins or less.  Goal status: INITIAL    ASSESSMENT:  CLINICAL IMPRESSION: Pt is progressing towards goals. Pt is now perfroming his laundry with uspervion and making hisbed. He continues to work on slowing down and attention to detail.PERFORMANCE DEFICITS: in functional skills including ADLs, IADLs, coordination, dexterity, strength, pain, flexibility, Fine motor control, Gross motor control, mobility, balance, endurance, decreased knowledge of precautions, decreased knowledge of use of DME, vision, and UE functional use, cognitive skills including attention, memory, problem solving, safety awareness, sequencing, and  temperament/personality, and psychosocial skills including coping strategies, environmental adaptation, habits, interpersonal interactions, and routines and behaviors.   IMPAIRMENTS: are limiting patient from ADLs, IADLs, work, play, leisure, and social participation.   CO-MORBIDITIES: may have co-morbidities  that affects occupational performance. Patient will benefit from skilled OT to address above impairments and improve overall function.  MODIFICATION OR ASSISTANCE TO COMPLETE EVALUATION: No modification of tasks or assist necessary to complete an evaluation.  OT OCCUPATIONAL PROFILE AND HISTORY: Detailed assessment: Review of records and additional review of physical, cognitive, psychosocial history related to current functional performance.  CLINICAL DECISION MAKING: LOW - limited treatment options, no task modification necessary  REHAB POTENTIAL: Good  EVALUATION COMPLEXITY: Low    PLAN:  OT FREQUENCY: 1-2x/week  OT DURATION: 12 weeks  PLANNED INTERVENTIONS: 97168 OT Re-evaluation, 97535 self care/ADL training, 02889 therapeutic exercise, 97530 therapeutic activity, 97112 neuromuscular re-education, 97140 manual therapy, 97035 ultrasound, 97018 paraffin, 02989 moist heat, 97010 cryotherapy, 97129 Cognitive training (first 15 min), 02869 Cognitive training(each additional 15 min), balance training, functional mobility training, visual/perceptual remediation/compensation, psychosocial skills training, energy conservation, coping strategies training, patient/family education, and DME and/or AE instructions  RECOMMENDED OTHER SERVICES: PT, ST  CONSULTED AND AGREED WITH PLAN OF CARE: Patient and family member/caregiver  PLAN FOR NEXT SESSION:   continue to work towards unmet goals   Kianna Billet, OT 12/26/2023, 3:54 PM

## 2023-12-26 NOTE — Therapy (Signed)
 OUTPATIENT PHYSICAL THERAPY LOWER EXTREMITY TREATMENT   Patient Name: Andrew Tanner MRN: [id number] DOB:10/08/1950, 73 y.o., male Today's Date: 12/26/2023  END OF SESSION:  PT End of Session - 12/26/23 1424     Visit Number 4    Date for PT Re-Evaluation 02/13/24    PT Start Time 1425    PT Stop Time 1510    PT Time Calculation (min) 45 min    Activity Tolerance Patient tolerated treatment well    Behavior During Therapy [facility] for tasks assessed/performed            Past Medical History:  Diagnosis Date   Anxiety    Atrial myxoma    Basal cell carcinoma    Cancer (HCC)    basal cell skin CA   Cataract    Depression    Diabetes mellitus without complication (HCC)    Diabetic neuropathy (HCC)    Duodenal adenoma    Hyperlipidemia    Hypertension    IC (interstitial cystitis)    Neuromuscular disorder (HCC) 01/09/21   Peripheral  Neuropathy   Pancreatic lesion    Squamous cell carcinoma of skin    Past Surgical History:  Procedure Laterality Date   BIOPSY  12/05/2022   Procedure: BIOPSY;  Surgeon: Wilhelmenia Aloha Raddle., MD;  Location: [facility];  Service: Gastroenterology;;   CATARACT EXTRACTION, BILATERAL     ESOPHAGOGASTRODUODENOSCOPY N/A 12/05/2022   Procedure: ESOPHAGOGASTRODUODENOSCOPY (EGD);  Surgeon: Wilhelmenia Aloha Raddle., MD;  Location: [facility];  Service: Gastroenterology;  Laterality: N/A;   EUS N/A 12/05/2022   Procedure: UPPER ENDOSCOPIC ULTRASOUND (EUS) RADIAL;  Surgeon: Wilhelmenia Aloha Raddle., MD;  Location: [facility];  Service: Gastroenterology;  Laterality: N/A;   FINE NEEDLE ASPIRATION N/A 12/05/2022   Procedure: FINE NEEDLE ASPIRATION (FNA) LINEAR;  Surgeon: Wilhelmenia Aloha Raddle., MD;  Location: [facility];  Service: Gastroenterology;  Laterality: N/A;   HERNIA REPAIR N/A    inguinal   NECK SURGERY     ant/post   SPINE SURGERY     SUBMUCOSAL TATTOO INJECTION  12/05/2022   Procedure: SUBMUCOSAL TATTOO INJECTION;  Surgeon:  Wilhelmenia Aloha Raddle., MD;  Location: [facility];  Service: Gastroenterology;;   TOTAL HIP ARTHROPLASTY Left 07/13/2023   Procedure: ARTHROPLASTY, HIP, TOTAL, ANTERIOR APPROACH;  Surgeon: Kendal Franky SQUIBB, MD;  Location: [facility];  Service: Orthopedics;  Laterality: Left;  [employer] bipolar Hana table  hemi hip   TRANSURETHRAL RESECTION OF PROSTATE     TURP VAPORIZATION     WHIPPLE PROCEDURE N/A 04/27/2023   Procedure: WHIPPLE PROCEDURE;  Surgeon: Aron Shoulders, MD;  Location: [facility];  Service: General;  Laterality: N/A;   Patient Active Problem List   Diagnosis Date Noted   Atrial myxoma 10/31/2023   History of left hip replacement 08/28/2023   Fracture of femoral neck, left (HCC) 07/13/2023   Protein-calorie malnutrition, severe 05/17/2023   Vomiting and diarrhea 05/13/2023   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   Malnutrition of moderate degree 05/08/2023   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   Poorly controlled type 2 diabetes mellitus with circulatory disorder (HCC) 02/08/2023   Vitamin D  deficiency 07/25/2022   Pain in both feet 02/10/2022   Peripheral neuropathy 01/25/2022   Seborrheic dermatitis 01/25/2022   Positive colorectal cancer screening using Cologuard test 03/16/2020   Chronic RLQ pain 03/16/2020   Type 2 diabetes mellitus with hyperglycemia, without long-term current use of insulin  (HCC) 06/02/2011   Hypertension 06/02/2011  Hyperlipemia 06/02/2011   Cataract 06/02/2011    PCP: Jenkins Earnie Carrel  REFERRING PROVIDER: Jenkins Earnie Carrel  REFERRING DIAG:  [phone] (ICD-10-CM) - History of left hip replacement  E44.0 (ICD-10-CM) - Malnutrition of moderate degree (HCC)  G60.9 (ICD-10-CM) - Idiopathic peripheral neuropathy  R53.1 (ICD-10-CM) - Generalized weakness    THERAPY DIAG:  Other lack of coordination  Other abnormalities of gait and mobility  Muscle weakness (generalized)  Rationale for  Evaluation and Treatment: Rehabilitation  ONSET DATE: 07/12/23  SUBJECTIVE:   SUBJECTIVE STATEMENT: Need to get home  PERTINENT HISTORY: 11/13/23--73 year old male with history of diabetes, neuropathy, hypertension, hyperlipidemia here for evaluation of memory, cognitive changes and generalized weakness.   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  He was admitted to the hospital for surgical treatment with Whipple procedure.  He was discharged home with couple weeks later.  He was readmitted on 05/13/2023 after increased diarrhea, weakness and falling down at home.  He was admitted for approximately 4 weeks with ongoing vomiting, diarrhea, protein calorie malnutrition, colitis and muscular deconditioning.  This time he was discharged to skilled nursing facility.   06/26/2023 patient fell down at nursing home, hit his head, and went to the emergency room.   07/12/2023 patient fell down again resulting in the left femoral neck fracture requiring admission and surgery. Overall patient has had significant difficulty returning to his baseline.  Wife notes that he is having some ongoing short-term memory difficulty, decreased insight, brain fog issues.  Also continues to be generally deconditioned and weak.  They live in a two-story home with the bedroom and bathroom upstairs, 14 steps, and this remains a challenge for patient to transition from skilled nursing facility back home.   Pt is a 73 y.o. male admitted via EMS 07/12/23 for fall at rehab facility. X-ray showed L femoral neck fx. L THA performed 4/3. 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,  basal cell carcinoma, diabetes mellitus, diabetic neuropathy, neuromuscular disorder, hyperlipidemia, malignant neuroendocrine tumor of duodenum   PAIN:  Are you having pain? Yes: NPRS scale: at worst 0/10 Pain location: feet, my hip sometimes hurts Pain description: stinging, cold  sensation Aggravating factors: worse at end of day Relieving factors: meds, lying down, exercise   PRECAUTIONS: Fall  RED FLAGS: None   WEIGHT BEARING RESTRICTIONS: No  FALLS:  Has patient fallen in last 6 months? Yes. Number of falls no falls in the last 3 months, but at least 12 in the last 6 months   LIVING ENVIRONMENT: Lives with: lives with their spouse Lives in: House/apartment Stairs: Yes: Internal: 14 steps; on right going up and on left going up and External: 2 in garage and 2 to front door steps; can reach both and bilateral but cannot reach both Has following equipment at home: Single point cane and Walker - 2 wheeled  **currently living at a Assisted Living for now**  OCCUPATION: An Tree surgeon   PLOF: Independent, Independent with basic ADLs, and Independent with gait  PATIENT GOALS: to get more independent, get back to my home  NEXT MD VISIT:   OBJECTIVE:  Note: Objective measures were completed at Evaluation unless otherwise noted.  DIAGNOSTIC FINDINGS: Left hip arthroplasty in expected alignment. No periprosthetic lucency or fracture. Recent postsurgical change includes air and edema in the soft tissues. Left hip arthroplasty without immediate postoperative complication.   COGNITION: Overall cognitive status: Within functional limits for tasks assessed     SENSATION:  Neuropathy in B feet    POSTURE: rounded shoulders and forward head   LOWER EXTREMITY ROM: WNL   LOWER EXTREMITY MMT: 4+/5 BLE    FUNCTIONAL TESTS:  5 times sit to stand: 19s  Timed up and go (TUG): 22s  GAIT: Distance walked: in clinic distances Assistive device utilized: Environmental consultant - 2 wheeled and None Level of assistance: Complete Independence and Modified independence Comments: able to complete TUG without walker, slow but is safe                                                                                                                        TREATMENT  DATE: 12/26/23 NuStep L5 x 7 min 6in forward & lateral step ups w/ one rail x10 each Stair training at pattern 2 rails 4 & 6 in S2S OHP yellow ball, cues to sit back more when descending 2x10  Standing march 3lb cuff 2x10  Posterior LOB Side step at mat table  Backwards walking  Alt 6 in box taps  HS curls 25lb 2x10 Leg Ext 10lb 2x10   12/18/23 NuStep L5x42mins  Leg ext 5# 2x10 HS curls 20# 2x10 Taps 4 CGA  Step ups 4 with rails Balance on airex, then with eyes closed  Calf raises 2x10 On airex reaching for numbers  Calf stretch on bar 15s x2 STS holding yellow ball 2x10  12/13/23 NuStep L5x22mins  LAQ 2# 2x10 HS curls green 2x10 Seated march and hip abd with band 2x10 Walking with cane 2 small laps around equipment STS 2x10   11/21/23 EVAL, POC    PATIENT EDUCATION:  Education details: POC, HEP, fall risk Person educated: Patient and Spouse Education method: Medical illustrator Education comprehension: verbalized understanding and returned demonstration  HOME EXERCISE PROGRAM: Access Code: [email or web address] URL: [email or web address] Date: 11/21/2023 Prepared by: Almetta Fam  Exercises - Sit to Stand  - 1 x daily - 7 x weekly - 2 sets - 10 reps - Seated Long Arc Quad  - 1 x daily - 7 x weekly - 2 sets - 10 reps - Standing Hip Abduction with Counter Support  - 1 x daily - 7 x weekly - 2 sets - 10 reps - Standing Hip Extension with Counter Support  - 1 x daily - 7 x weekly - 2 sets - 10 reps - Heel Raises with Counter Support  - 1 x daily - 7 x weekly - 2 sets - 10 reps  ASSESSMENT:  CLINICAL IMPRESSION: Patient doing good, He continues to be a bit impulsive and unsafe with the walker at times. Often leaves the walker and tries to take steps without it when getting in and out of machines and transferring. More focus on functional interventions. Some instability with step ups, marches, and backwards walking. Needs multiple cues to slow down during  and focus on interventions. He reports STS are the hardest exercise for him.    Patient is a 73 y.o. male who was seen today for  physical therapy evaluation and treatment for weakness. He has an extensive medical history and multiple surgeries that has caused him to lose a lot of weight and muscle mass. His wife reports a number of falls since the beginning of the year but none in the past 3 months. Currently he is staying at an assisted living to work on ADLs and increasing his independence. He walks with a walker outside of his home. His wife reports he has a cane, but needs practice on how to use it. Pt reports he does some walking around the house without it. At their home, he has stairs up to the bedroom and bathrooms so would like to practice working on this. He does have neuropathy and reports high levels of pain at times in both feet. Patient will benefit from PT to increase his strength and allow him to improve functional independence and mobility.   OBJECTIVE IMPAIRMENTS: Abnormal gait, decreased activity tolerance, decreased balance, decreased coordination, decreased endurance, difficulty walking, decreased strength, decreased safety awareness, and pain.   ACTIVITY LIMITATIONS: carrying, lifting, squatting, stairs, transfers, and locomotion level  PARTICIPATION LIMITATIONS: meal prep, cleaning, laundry, driving, shopping, community activity, and yard work  PERSONAL FACTORS: Fitness, Past/current experiences, Time since onset of injury/illness/exacerbation, and 3+ comorbidities: istory of diabetes, neuropathy, hypertension, hyperlipidemia here for evaluation of memory, cognitive changes and generalized weakness are also affecting patient's functional outcome.   REHAB POTENTIAL: Good  CLINICAL DECISION MAKING: Stable/uncomplicated  EVALUATION COMPLEXITY: Low  GOALS: Goals reviewed with patient? Yes  SHORT TERM GOALS: Target date: 01/02/24  Patient will be independent with initial  HEP. Baseline:  Goal status: MET 12/13/23  2.  Patient will demonstrate improved functional LE strength as demonstrated by 5xSTS <15s. Baseline: 19s Goal status: INITIAL  3.  Patient will be educated on strategies to decrease risk of falls.  Baseline:  Goal status: IN PROGRESS 12/13/23   LONG TERM GOALS: Target date: 02/13/24  Patient will be independent with advanced/ongoing HEP to improve outcomes and carryover.  Baseline:  Goal status: INITIAL  2.  Patient will be able to ambulate 500' without AD with good safety to access community.  Baseline: short distances without AD Goal status: INITIAL  3.  Patient will be able to navigate a set of stairs with reciprocal pattern for safety inside his home.  Baseline:  Goal status: INITIAL   4.  Patient will demonstrate decreased fall risk by scoring < 14 sec on TUG. Baseline: 22s without walker Goal status: INITIAL  PLAN:  PT FREQUENCY: 2x/week  PT DURATION: 12 weeks  PLANNED INTERVENTIONS: 97110-Therapeutic exercises, 97530- Therapeutic activity, 97112- Neuromuscular re-education, 97535- Self Care, 02859- Manual therapy, [phone]- Gait training, [phone]- Electrical stimulation (unattended), 97016- Vasopneumatic device, 20560 (1-2 muscles), 20561 (3+ muscles)- Dry Needling, Patient/Family education, Balance training, Stair training, Joint mobilization, Joint manipulation, Spinal manipulation, Spinal mobilization, Cryotherapy, and Moist heat  PLAN FOR NEXT SESSION: LE strengthening, gait training with cane, functional tasks   Tanda KANDICE Sorrow, PTA 12/26/2023, 2:25 PM

## 2023-12-27 ENCOUNTER — Encounter: Payer: Self-pay | Admitting: Family Medicine

## 2023-12-27 ENCOUNTER — Telehealth: Payer: Self-pay | Admitting: Family Medicine

## 2023-12-27 ENCOUNTER — Ambulatory Visit (INDEPENDENT_AMBULATORY_CARE_PROVIDER_SITE_OTHER): Admitting: Family Medicine

## 2023-12-27 ENCOUNTER — Ambulatory Visit: Payer: Self-pay

## 2023-12-27 VITALS — BP 150/72 | HR 85 | Temp 98.8°F | Resp 18 | Ht 72.0 in | Wt 152.1 lb

## 2023-12-27 DIAGNOSIS — D508 Other iron deficiency anemias: Secondary | ICD-10-CM | POA: Diagnosis not present

## 2023-12-27 DIAGNOSIS — R030 Elevated blood-pressure reading, without diagnosis of hypertension: Secondary | ICD-10-CM

## 2023-12-27 DIAGNOSIS — K921 Melena: Secondary | ICD-10-CM | POA: Diagnosis not present

## 2023-12-27 DIAGNOSIS — Z79899 Other long term (current) drug therapy: Secondary | ICD-10-CM

## 2023-12-27 DIAGNOSIS — D132 Benign neoplasm of duodenum: Secondary | ICD-10-CM

## 2023-12-27 NOTE — Telephone Encounter (Signed)
 FYI Only or Action Required?: FYI only for provider.  Patient was last seen in primary care on 08/28/2023 by Wendolyn Jenkins Jansky, MD.  Called Nurse Triage reporting Neurologic Problem.  Symptoms began yesterday.  Interventions attempted: Nothing.  Symptoms are: gradually worsening.  Triage Disposition: See HCP Within 4 Hours (Or PCP Triage)  Patient/caregiver understands and will follow disposition?: Yes  **Appt. Scheduled for 9/17**          Copied from CRM #[id number]. Topic: Clinical - Red Word Triage >> Dec 27, 2023 11:38 AM Robinson H wrote: [facility] that prompted transfer to Nurse Triage: Patients wife states that she has some concerns about patients mental state, states they were at dinner last night and patient became agitated, indignant and verbal not normal for him. Wants to speak with provider regarding next steps Reason for Disposition  [1] Acting confused (e.g., disoriented, slurred speech) AND [2] brief (now gone)    No current confusion, patient is becoming more angry and agitated.  Answer Assessment - Initial Assessment Questions 1. LEVEL OF CONSCIOUSNESS: How are they (the patient) acting right now? (e.g., alert-oriented, confused, lethargic, stuporous, comatose)      Agitated, angry per wife  2. ONSET: When did the confusion start?  (e.g., minutes, hours, days)      X 1 day ago  3. PATTERN: Does this come and go, or has it been constant since it started?  Is it present now?     Intermittent   4. ALCOHOL or DRUGS: Have they been drinking alcohol or taking any drugs?      No   5. NARCOTIC MEDICINES: Have they been receiving any narcotic medications? (e.g., morphine , Vicodin)     No   6. CAUSE: What do you think is causing the confusion?       Wife suspects the anger and confusion is because of the patients tacitly.   7. OTHER SYMPTOMS: Are there any other symptoms? (e.g., difficulty breathing, fever, headache, weakness)     No  .   wife reports patient is in a group home recovering from hip surgery. She reports he is becoming progressively angry, confused and agitated.  Protocols used: Confusion - Delirium-A-AH

## 2023-12-27 NOTE — Telephone Encounter (Signed)
 Pt is asking if his appointment can be VV. He said when changed in [facility] he will see the change. No call necessary.

## 2023-12-27 NOTE — Patient Instructions (Signed)
 Talk to therapists and rehab team.

## 2023-12-27 NOTE — Progress Notes (Signed)
 Subjective:     Patient ID: Andrew Tanner, male    DOB: 1950-08-15, 73 y.o.   MRN: [id number]  Chief Complaint  Patient presents with   Follow-up    Patient stated he wanted to follow-up with pcp about some issues     HPI Discussed the use of AI scribe software for clinical note transcription with the patient, who gave verbal consent to proceed.  History of Present Illness Andrew Tanner is a 73 year old male who presents with black diarrhea and concerns about iron  levels. He is accompanied by his wife, [name].  Main reason here-req labsHe has been experiencing black stools for the past three weeks. The bowel movements are incomplete, requiring multiple trips to the bathroom to fully evacuate. The stool is thick, sticky, and sometimes dark brown rather than black. There is no frank blood, vomiting, nausea, or persistent abdominal pain, though he experiences gas that signals the need to defecate. He is concerned about his iron  levels and inquires about blood testing.  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  Hard time post surg-diarrhea, wt loss.  [facility] to [facility], fell, fx hip and then back to rehab.  Wife has her own problems and isn't able to care to him.   He is currently not taking Gemtesa as he is undergoing weekly treatments to desensitize his bladder. He continues to take Seroquel, pantoprazole , lipase, mannose, and pegliflozin. There was a question about his statin use, which he confirms he is taking.  He has been living away from home since the beginning of the year and is currently staying in a group home with three other individuals in hospice care. He expresses a strong desire to return home, citing emotional fatigue from his current living situation. He has been participating in physical therapy and reports progress, including performing daily activities independently. However, he reports that there is disagreement w/his wife about his readiness to return  home, and he believes he is ready to return, particularly regarding his ability to use the stairs.  He mentions a previous fall that occurred early in his stay at the group home, where he tripped over a porta potty leg and fell on his hip, which had been bothering him. He has not fallen in the past four months and uses a cane in the house, though he sometimes walks without it when unsupervised.  He has lost 50 pounds and is working to regain weight. 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 He reports no mental status changes and is actively seeking therapy to address emotional concerns related to his living situation.  No vomiting, nausea, persistent abdominal pain, trouble urinating, fevers, chills, headaches, dizziness, or lightheadedness upon standing.  Bp's have been fine.  Very agrevated today(circumstances)    Health Maintenance Due  Topic Date Due   Zoster Vaccines- Shingrix (1 of 2) Never done   Fecal DNA (Cologuard)  02/05/2023   OPHTHALMOLOGY EXAM  08/16/2023   Influenza Vaccine  11/10/2023   Medicare Annual Wellness (AWV)  01/03/2024    Past Medical History:  Diagnosis Date   Anxiety    Atrial myxoma    Basal cell carcinoma    Cancer (HCC)    basal cell skin CA   Cataract    Depression    Diabetes mellitus without complication (HCC)    Diabetic neuropathy (HCC)    Duodenal adenoma  Hyperlipidemia    Hypertension    IC (interstitial cystitis)    Neuromuscular disorder (HCC) 01/09/21   Peripheral  Neuropathy   Pancreatic lesion    Squamous cell carcinoma of skin     Past Surgical History:  Procedure Laterality Date   BIOPSY  12/05/2022   Procedure: BIOPSY;  Surgeon: Wilhelmenia Aloha Raddle., MD;  Location: [facility];  Service: Gastroenterology;;   CATARACT EXTRACTION, BILATERAL     ESOPHAGOGASTRODUODENOSCOPY N/A 12/05/2022   Procedure: ESOPHAGOGASTRODUODENOSCOPY  (EGD);  Surgeon: Wilhelmenia Aloha Raddle., MD;  Location: [facility];  Service: Gastroenterology;  Laterality: N/A;   EUS N/A 12/05/2022   Procedure: UPPER ENDOSCOPIC ULTRASOUND (EUS) RADIAL;  Surgeon: Wilhelmenia Aloha Raddle., MD;  Location: [facility];  Service: Gastroenterology;  Laterality: N/A;   FINE NEEDLE ASPIRATION N/A 12/05/2022   Procedure: FINE NEEDLE ASPIRATION (FNA) LINEAR;  Surgeon: Wilhelmenia Aloha Raddle., MD;  Location: [facility];  Service: Gastroenterology;  Laterality: N/A;   HERNIA REPAIR N/A    inguinal   NECK SURGERY     ant/post   SPINE SURGERY     SUBMUCOSAL TATTOO INJECTION  12/05/2022   Procedure: SUBMUCOSAL TATTOO INJECTION;  Surgeon: Wilhelmenia Aloha Raddle., MD;  Location: [facility];  Service: Gastroenterology;;   TOTAL HIP ARTHROPLASTY Left 07/13/2023   Procedure: ARTHROPLASTY, HIP, TOTAL, ANTERIOR APPROACH;  Surgeon: Kendal Franky SQUIBB, MD;  Location: [facility];  Service: Orthopedics;  Laterality: Left;  [employer] bipolar Hana table  hemi hip   TRANSURETHRAL RESECTION OF PROSTATE     TURP VAPORIZATION     WHIPPLE PROCEDURE N/A 04/27/2023   Procedure: WHIPPLE PROCEDURE;  Surgeon: Aron Shoulders, MD;  Location: [facility];  Service: General;  Laterality: N/A;     Current Outpatient Medications:    acetaminophen  (TYLENOL ) 500 MG tablet, Take 2 tablets (1,000 mg total) by mouth every 6 (six) hours as needed for mild pain (pain score 1-3)., Disp: 100 tablet, Rfl: 0   alfuzosin  (UROXATRAL ) 10 MG 24 hr tablet, Take 1 tablet (10 mg total) by mouth daily with breakfast., Disp: 90 tablet, Rfl: 3   amitriptyline (ELAVIL) 10 MG tablet, Take 10 mg by mouth at bedtime., Disp: , Rfl:    aspirin  EC 81 MG tablet, Take 1 tablet (81 mg total) by mouth daily. Swallow whole., Disp: , Rfl:    clonazePAM  (KLONOPIN ) 0.5 MG tablet, Take 1.5 mg by mouth at bedtime., Disp: , Rfl:    Continuous Glucose Sensor (DEXCOM G7 SENSOR) MISC, Use to monitor glucose continuously, change every 10 days,  Disp: 6 each, Rfl: 3   empagliflozin  (JARDIANCE ) 10 MG TABS tablet, Take 1 tablet (10 mg total) by mouth daily before breakfast., Disp: 90 tablet, Rfl: 3   EQ SENNA-S 8.6-50 MG tablet, Take 1 tablet by mouth daily., Disp: , Rfl:    ferrous sulfate  (FEROSUL) 325 (65 FE) MG tablet, Take 325 mg by mouth 2 (two) times daily., Disp: , Rfl:    fluconazole  (DIFLUCAN ) 150 MG tablet, Take 1 tablet (150 mg total) by mouth as directed. Take one tablet now then 1 tablet in 7 days, Disp: 2 tablet, Rfl: 0   FLUoxetine  (PROZAC ) 20 MG capsule, Take 20 mg by mouth daily., Disp: , Rfl:    glucose blood test strip, 1 each by Other route in the morning and at bedtime. Use as instructed checking once per day, or if new symptoms., Disp: 100 each, Rfl: 1   hydrOXYzine  (ATARAX /VISTARIL ) 50 MG tablet, Take 50 mg by mouth at bedtime as  needed (sleep/anxiety.)., Disp: , Rfl:    insulin  glargine (LANTUS  SOLOSTAR) 100 UNIT/ML Solostar Pen, Inject 14 Units into the skin daily., Disp: 30 mL, Rfl: 3   Insulin  Lispro-aabc (LYUMJEV  KWIKPEN) 100 UNIT/ML KwikPen, Inject 7 Units into the skin 3 (three) times daily before meals., Disp: 30 mL, Rfl: 3   Insulin  Pen Needle 32G X 4 MM MISC, Use 2x a day, Disp: 200 each, Rfl: 3   ketoconazole  (NIZORAL ) 2 % shampoo, APPLY TO SCALP AND FACE 2 TO 3 TIMES A WEEK., Disp: 120 mL, Rfl: 0   lipase/protease/amylase (CREON ) 36000 UNITS CPEP capsule, Take 2 capsules (72,000 Units total) by mouth 3 (three) times daily before meals., Disp: 180 capsule, Rfl: 5   megestrol  (MEGACE ) 40 MG/ML suspension, Take 10 mLs by mouth daily., Disp: , Rfl:    nystatin -triamcinolone  ointment (MYCOLOG), Apply 1 Application topically 2 (two) times daily., Disp: 30 g, Rfl: 0   pantoprazole  (PROTONIX ) 40 MG tablet, Take 40 mg by mouth daily., Disp: , Rfl:    polyethylene glycol (MIRALAX  / GLYCOLAX ) 17 g packet, Take 17 g by mouth daily as needed (constipation.)., Disp: , Rfl:    QUEtiapine (SEROQUEL) 25 MG tablet, Take 25  mg by mouth at bedtime., Disp: , Rfl:    rosuvastatin  (CRESTOR ) 20 MG tablet, Take 20 mg by mouth daily., Disp: , Rfl:    Vibegron (GEMTESA) 75 MG TABS, Take by mouth., Disp: , Rfl:   No Known Allergies ROS neg/noncontributory except as noted HPI/below      Objective:     BP (!) 150/72 (BP Location: Left Arm, Patient Position: Sitting, Cuff Size: Normal)   Pulse 85   Temp 98.8 F (37.1 C) (Temporal)   Resp 18   Ht 6' (1.829 m)   Wt 152 lb 2 oz (69 kg)   SpO2 98%   BMI 20.63 kg/m  Wt Readings from Last 3 Encounters:  12/27/23 152 lb 2 oz (69 kg)  11/13/23 152 lb (68.9 kg)  10/31/23 152 lb 12.8 oz (69.3 kg)    Physical Exam   Gen: WDWN NAD HEENT: NCAT, conjunctiva not injected, sclera nonicteric NECK:  supple, no thyromegaly, no nodes, no carotid bruits CARDIAC: RRR, S1S2+, no murmur.  LUNGS: CTAB. No wheezes ABDOMEN:  BS+, soft, NTND, No HSM, no masses EXT:  no edema MSK: walker.  NEURO: A&O x3.  CN II-XII intact.  PSYCH: normal mood. Good eye contact     Assessment & Plan:  Melena  Other iron  deficiency anemia -     CBC with Differential/Platelet -     IBC + Ferritin  High risk medication use -     Comprehensive metabolic panel with GFR -     Magnesium   Elevated blood pressure reading  Duodenal adenoma  Assessment and Plan Assessment & Plan Melena with possible iron  deficiency anemia   Reports black stools for three weeks, incomplete bowel movements, and occasional gas-related abdominal discomfort. No frank blood, vomiting, or nausea. Differential includes iron  deficiency anemia from GIB, other. Order blood test for iron  levels, cbc.  Mobility impairment with hip injury   Undergoing physical therapy to improve mobility. Uses a cane and sometimes walks without assistance at home, which is not advised. Concerns about safety and potential for falls if returning home. Advised pt and wife to Consult with PT/OT team to assess readiness for independent living.  Advise use of cane or walker at all times for safety. Discuss potential for home health aid if needed.  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 Desires to return home but faces opposition from wife due to safety concerns. Plans to discuss these issues with a therapist. Engages in art as a coping mechanism, which has been beneficial. Encourage continuation of art as a therapeutic activity. Schedule appointment with therapist for further support.  Type 2 diabetes mellitus-now type 1 d/t whipple -managed by endo  Concerns about diet at current living facility not being diabetic-friendly.  Elevated bp  Reports generally low blood pressure readings at various medical appointments. Current elevated reading attributed to stress and anxiety during the visit. Monitor blood pressure regularly at home or during medical visits.  Overactive bladder   Undergoing weekly desensitization therapy for bladder management. Not on Gemtesa.    Return in about 3 months (around 03/27/2024) for chronic follow-up.  Jenkins CHRISTELLA Carrel, MD

## 2023-12-28 ENCOUNTER — Ambulatory Visit: Payer: Self-pay | Admitting: Family Medicine

## 2023-12-28 ENCOUNTER — Encounter: Payer: Self-pay | Admitting: Family Medicine

## 2023-12-28 DIAGNOSIS — R3915 Urgency of urination: Secondary | ICD-10-CM | POA: Diagnosis not present

## 2023-12-28 DIAGNOSIS — R350 Frequency of micturition: Secondary | ICD-10-CM | POA: Diagnosis not present

## 2023-12-28 DIAGNOSIS — D729 Disorder of white blood cells, unspecified: Secondary | ICD-10-CM

## 2023-12-28 LAB — CBC WITH DIFFERENTIAL/PLATELET
Basophils Absolute: 0 K/uL (ref 0.0–0.1)
Basophils Relative: 0.2 % (ref 0.0–3.0)
Eosinophils Absolute: 0 K/uL (ref 0.0–0.7)
Eosinophils Relative: 0.4 % (ref 0.0–5.0)
HCT: 41.1 % (ref 39.0–52.0)
Hemoglobin: 13.7 g/dL (ref 13.0–17.0)
Lymphocytes Relative: 4.9 % — ABNORMAL LOW (ref 12.0–46.0)
Lymphs Abs: 0.4 K/uL — ABNORMAL LOW (ref 0.7–4.0)
MCHC: 33.4 g/dL (ref 30.0–36.0)
MCV: 88.9 fl (ref 78.0–100.0)
Monocytes Absolute: 0.6 K/uL (ref 0.1–1.0)
Monocytes Relative: 7.1 % (ref 3.0–12.0)
Neutro Abs: 7 K/uL (ref 1.4–7.7)
Neutrophils Relative %: 87.4 % — ABNORMAL HIGH (ref 43.0–77.0)
Platelets: 193 K/uL (ref 150.0–400.0)
RBC: 4.62 Mil/uL (ref 4.22–5.81)
RDW: 14.1 % (ref 11.5–15.5)
WBC: 8 K/uL (ref 4.0–10.5)

## 2023-12-28 LAB — IBC + FERRITIN
Ferritin: 69.5 ng/mL (ref 22.0–322.0)
Iron: 74 ug/dL (ref 42–165)
Saturation Ratios: 21.7 % (ref 20.0–50.0)
TIBC: 341.6 ug/dL (ref 250.0–450.0)
Transferrin: 244 mg/dL (ref 212.0–360.0)

## 2023-12-28 LAB — COMPREHENSIVE METABOLIC PANEL WITH GFR
ALT: 103 U/L — ABNORMAL HIGH (ref 0–53)
AST: 69 U/L — ABNORMAL HIGH (ref 0–37)
Albumin: 4.3 g/dL (ref 3.5–5.2)
Alkaline Phosphatase: 98 U/L (ref 39–117)
BUN: 16 mg/dL (ref 6–23)
CO2: 24 meq/L (ref 19–32)
Calcium: 10.4 mg/dL (ref 8.4–10.5)
Chloride: 104 meq/L (ref 96–112)
Creatinine, Ser: 0.82 mg/dL (ref 0.40–1.50)
GFR: 86.55 mL/min (ref >60.00)
Glucose, Bld: 188 mg/dL — ABNORMAL HIGH (ref 70–99)
Potassium: 4 meq/L (ref 3.5–5.1)
Sodium: 137 meq/L (ref 135–145)
Total Bilirubin: 0.4 mg/dL (ref 0.2–1.2)
Total Protein: 6.7 g/dL (ref 6.0–8.3)

## 2023-12-28 LAB — MAGNESIUM: Magnesium: 2.1 mg/dL (ref 1.5–2.5)

## 2023-12-28 NOTE — Progress Notes (Signed)
 Labs look better/stable.  Neutrophils a little elevated but not sick.  Repeat 1 month(or if gets sick, let me know).

## 2023-12-28 NOTE — Telephone Encounter (Signed)
 I will change it

## 2023-12-31 ENCOUNTER — Encounter: Payer: Self-pay | Admitting: Occupational Therapy

## 2023-12-31 ENCOUNTER — Encounter: Payer: Self-pay | Admitting: Speech Pathology

## 2024-01-03 ENCOUNTER — Encounter: Payer: Self-pay | Admitting: Occupational Therapy

## 2024-01-03 ENCOUNTER — Encounter: Payer: Self-pay | Admitting: Speech Pathology

## 2024-01-03 ENCOUNTER — Encounter: Payer: Self-pay | Admitting: Physical Therapy

## 2024-01-03 ENCOUNTER — Ambulatory Visit: Admitting: Speech Pathology

## 2024-01-03 ENCOUNTER — Ambulatory Visit: Admitting: Occupational Therapy

## 2024-01-03 ENCOUNTER — Ambulatory Visit: Admitting: Physical Therapy

## 2024-01-03 DIAGNOSIS — R41844 Frontal lobe and executive function deficit: Secondary | ICD-10-CM | POA: Diagnosis not present

## 2024-01-03 DIAGNOSIS — M6281 Muscle weakness (generalized): Secondary | ICD-10-CM | POA: Diagnosis not present

## 2024-01-03 DIAGNOSIS — R2689 Other abnormalities of gait and mobility: Secondary | ICD-10-CM

## 2024-01-03 DIAGNOSIS — R278 Other lack of coordination: Secondary | ICD-10-CM | POA: Diagnosis not present

## 2024-01-03 DIAGNOSIS — R41840 Attention and concentration deficit: Secondary | ICD-10-CM

## 2024-01-03 DIAGNOSIS — R296 Repeated falls: Secondary | ICD-10-CM | POA: Diagnosis not present

## 2024-01-03 DIAGNOSIS — R41841 Cognitive communication deficit: Secondary | ICD-10-CM | POA: Diagnosis not present

## 2024-01-03 NOTE — Therapy (Signed)
 OUTPATIENT PHYSICAL THERAPY LOWER EXTREMITY TREATMENT   Patient Name: Aarian Griffie MRN: [id number] DOB:May 21, 1950, 73 y.o., male Today's Date: 01/03/2024  END OF SESSION:  PT End of Session - 01/03/24 1353     Visit Number 5    Date for Recertification  02/13/24    Authorization Type BCBS Medicare    PT Start Time 1146    PT Stop Time 1226    PT Time Calculation (min) 40 min    Activity Tolerance Patient tolerated treatment well    Behavior During Therapy WFL for tasks assessed/performed             Past Medical History:  Diagnosis Date   Anxiety    Atrial myxoma    Basal cell carcinoma    Cancer (HCC)    basal cell skin CA   Cataract    Depression    Diabetes mellitus without complication (HCC)    Diabetic neuropathy (HCC)    Duodenal adenoma    Hyperlipidemia    Hypertension    IC (interstitial cystitis)    Neuromuscular disorder (HCC) 01/09/21   Peripheral  Neuropathy   Pancreatic lesion    Squamous cell carcinoma of skin    Past Surgical History:  Procedure Laterality Date   BIOPSY  12/05/2022   Procedure: BIOPSY;  Surgeon: Wilhelmenia Aloha Raddle., MD;  Location: [facility];  Service: Gastroenterology;;   CATARACT EXTRACTION, BILATERAL     ESOPHAGOGASTRODUODENOSCOPY N/A 12/05/2022   Procedure: ESOPHAGOGASTRODUODENOSCOPY (EGD);  Surgeon: Wilhelmenia Aloha Raddle., MD;  Location: [facility];  Service: Gastroenterology;  Laterality: N/A;   EUS N/A 12/05/2022   Procedure: UPPER ENDOSCOPIC ULTRASOUND (EUS) RADIAL;  Surgeon: Wilhelmenia Aloha Raddle., MD;  Location: [facility];  Service: Gastroenterology;  Laterality: N/A;   FINE NEEDLE ASPIRATION N/A 12/05/2022   Procedure: FINE NEEDLE ASPIRATION (FNA) LINEAR;  Surgeon: Wilhelmenia Aloha Raddle., MD;  Location: [facility];  Service: Gastroenterology;  Laterality: N/A;   HERNIA REPAIR N/A    inguinal   NECK SURGERY     ant/post   SPINE SURGERY     SUBMUCOSAL TATTOO INJECTION  12/05/2022   Procedure:  SUBMUCOSAL TATTOO INJECTION;  Surgeon: Wilhelmenia Aloha Raddle., MD;  Location: [facility];  Service: Gastroenterology;;   TOTAL HIP ARTHROPLASTY Left 07/13/2023   Procedure: ARTHROPLASTY, HIP, TOTAL, ANTERIOR APPROACH;  Surgeon: Kendal Franky SQUIBB, MD;  Location: [facility];  Service: Orthopedics;  Laterality: Left;  [employer] bipolar Hana table  hemi hip   TRANSURETHRAL RESECTION OF PROSTATE     TURP VAPORIZATION     WHIPPLE PROCEDURE N/A 04/27/2023   Procedure: WHIPPLE PROCEDURE;  Surgeon: Aron Shoulders, MD;  Location: [facility];  Service: General;  Laterality: N/A;   Patient Active Problem List   Diagnosis Date Noted   Atrial myxoma 10/31/2023   History of left hip replacement 08/28/2023   Fracture of femoral neck, left (HCC) 07/13/2023   Protein-calorie malnutrition, severe 05/17/2023   Vomiting and diarrhea 05/13/2023   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   Malnutrition of moderate degree 05/08/2023   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   Poorly controlled type 2 diabetes mellitus with circulatory disorder (HCC) 02/08/2023   Vitamin D  deficiency 07/25/2022   Pain in both feet 02/10/2022   Peripheral neuropathy 01/25/2022   Seborrheic dermatitis 01/25/2022   Positive colorectal cancer screening using Cologuard test 03/16/2020   Chronic RLQ pain 03/16/2020   Type 2 diabetes mellitus with hyperglycemia, without long-term current use of insulin  (  HCC) 06/02/2011   Hypertension 06/02/2011   Hyperlipemia 06/02/2011   Cataract 06/02/2011    PCP: Jenkins Earnie Carrel  REFERRING PROVIDER: Jenkins Earnie Carrel  REFERRING DIAG:  [id number] (ICD-10-CM) - History of left hip replacement  E44.0 (ICD-10-CM) - Malnutrition of moderate degree (HCC)  G60.9 (ICD-10-CM) - Idiopathic peripheral neuropathy  R53.1 (ICD-10-CM) - Generalized weakness    THERAPY DIAG:  Muscle weakness (generalized)  Other abnormalities of gait and mobility  Other lack  of coordination  Repeated falls  Rationale for Evaluation and Treatment: Rehabilitation  ONSET DATE: 07/12/23  SUBJECTIVE:   SUBJECTIVE STATEMENT:  Had to eat breakfast outside of my group home, they messed it up but I was able to eat and feel OK now. It was 263 this morning. Some of my art work has gone viral.  Per spouse: we have a two story home with 14 steps, its very critical that he gets up all of the steps safely. I'm very concerned about his safety on steps. Also having some safety concerns on stairs. Haskel saw his PCP and she clearly stated that he needs to be using his RW as instructed, he agreed then when we got home he stopped using the walker. We have an option to improve his living situation with the ALF, his preference is to come home and have me manage everything by myself. We have a chance to improve mobility/strength/safety before he comes come, going back and forth.   PERTINENT HISTORY: 11/13/23--73 year old male with history of diabetes, neuropathy, hypertension, hyperlipidemia here for evaluation of memory, cognitive changes and generalized weakness.   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  He was admitted to the hospital for surgical treatment with Whipple procedure.  He was discharged home with couple weeks later.  He was readmitted on 05/13/2023 after increased diarrhea, weakness and falling down at home.  He was admitted for approximately 4 weeks with ongoing vomiting, diarrhea, protein calorie malnutrition, colitis and muscular deconditioning.  This time he was discharged to skilled nursing facility.   06/26/2023 patient fell down at nursing home, hit his head, and went to the emergency room.   07/12/2023 patient fell down again resulting in the left femoral neck fracture requiring admission and surgery. Overall patient has had significant difficulty returning to his baseline.  Wife notes that he is having some  ongoing short-term memory difficulty, decreased insight, brain fog issues.  Also continues to be generally deconditioned and weak.  They live in a two-story home with the bedroom and bathroom upstairs, 14 steps, and this remains a challenge for patient to transition from skilled nursing facility back home.   Pt is a 73 y.o. male admitted via EMS 07/12/23 for fall at rehab facility. X-ray showed L femoral neck fx. L THA performed 4/3. 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,  basal cell carcinoma, diabetes mellitus, diabetic neuropathy, neuromuscular disorder, hyperlipidemia, malignant neuroendocrine tumor of duodenum   PAIN:  Are you having pain? Yes: NPRS scale: just soreness/10 Pain location: L hip  Pain description: soreness  Aggravating factors: worse at end of day Relieving factors: meds, lying down, exercise   PRECAUTIONS: Fall  RED FLAGS: None   WEIGHT BEARING RESTRICTIONS: No  FALLS:  Has patient fallen in last 6 months? Yes. Number of falls no falls in the last 3 months, but at least 12 in the last 6 months   LIVING ENVIRONMENT: Lives with: lives with their spouse Lives in: House/apartment Stairs:  Yes: Internal: 14 steps; on right going up and on left going up and External: 2 in garage and 2 to front door steps; can reach both and bilateral but cannot reach both Has following equipment at home: Single point cane and Walker - 2 wheeled  **currently living at a Assisted Living for now**  OCCUPATION: An Tree surgeon   PLOF: Independent, Independent with basic ADLs, and Independent with gait  PATIENT GOALS: to get more independent, get back to my home  NEXT MD VISIT:   OBJECTIVE:  Note: Objective measures were completed at Evaluation unless otherwise noted.  DIAGNOSTIC FINDINGS: Left hip arthroplasty in expected alignment. No periprosthetic lucency or fracture. Recent postsurgical change includes air and edema in the soft tissues. Left hip arthroplasty without immediate  postoperative complication.   COGNITION: Overall cognitive status: Within functional limits for tasks assessed     SENSATION: Neuropathy in B feet    POSTURE: rounded shoulders and forward head   LOWER EXTREMITY ROM: WNL   LOWER EXTREMITY MMT: 4+/5 BLE    FUNCTIONAL TESTS:  5 times sit to stand: 19s  Timed up and go (TUG): 22s  GAIT: Distance walked: in clinic distances Assistive device utilized: Environmental consultant - 2 wheeled and None Level of assistance: Complete Independence and Modified independence Comments: able to complete TUG without walker, slow but is safe                                                                                                                        TREATMENT DATE:  01/03/24  Discussed pt's overall case and habits at home, set up of steps and home, usual exercise routine before surgery, also PT recommendation to go to ALF prior to home due to fall risk on steps/would be much safer to work on mobility/strength/balance prior to return home where he will need to be basically mod(I)  Gait outside with RW, curb and ramp navigation with min guard to MinA- had a hard time with surface changes and often used RW unsafely on these requiring min guard.  Full flight of steps x2 with U rail, MinA especially with descent due to intermittent posterior LOB, reciprocal pattern used.   Blue foam pad tandem stance 3x30 seconds B Min-modA  Tandem walking solid surface x4 laps in // bars     12/26/23 NuStep L5 x 7 min 6in forward & lateral step ups w/ one rail x10 each Stair training at pattern 2 rails 4 & 6 in S2S OHP yellow ball, cues to sit back more when descending 2x10  Standing march 3lb cuff 2x10  Posterior LOB Side step at mat table  Backwards walking  Alt 6 in box taps  HS curls 25lb 2x10 Leg Ext 10lb 2x10   12/18/23 NuStep L5x43mins  Leg ext 5# 2x10 HS curls 20# 2x10 Taps 4 CGA  Step ups 4 with rails Balance on airex, then with eyes closed   Calf raises 2x10 On airex reaching for numbers  Calf stretch  on bar 15s x2 STS holding yellow ball 2x10  12/13/23 NuStep L5x35mins  LAQ 2# 2x10 HS curls green 2x10 Seated march and hip abd with band 2x10 Walking with cane 2 small laps around equipment STS 2x10   11/21/23 EVAL, POC    PATIENT EDUCATION:  Education details: POC, HEP, fall risk Person educated: Patient and Spouse Education method: Medical illustrator Education comprehension: verbalized understanding and returned demonstration  HOME EXERCISE PROGRAM: Access Code: [email or web address] URL: [email or web address] Date: 11/21/2023 Prepared by: Almetta Fam  Exercises - Sit to Stand  - 1 x daily - 7 x weekly - 2 sets - 10 reps - Seated Long Arc Quad  - 1 x daily - 7 x weekly - 2 sets - 10 reps - Standing Hip Abduction with Counter Support  - 1 x daily - 7 x weekly - 2 sets - 10 reps - Standing Hip Extension with Counter Support  - 1 x daily - 7 x weekly - 2 sets - 10 reps - Heel Raises with Counter Support  - 1 x daily - 7 x weekly - 2 sets - 10 reps  ASSESSMENT:  CLINICAL IMPRESSION:  Patient doing alright, spouse present today to observe steps. We walked to the back building- he had difficulty with his RW in the context of surface changes and needed Min cues/Min guard for safe navigation of curbs. Very unsafe on steps even with U rail- needed MinA for balance due to recurrent posterior balance loss. This improved with second attempt but still unsafe to attempt stairs on his own. Had a lot of difficulty with balance exercises today. Advised them to continue forward with chance to get into ALF.    Patient is a 73 y.o. male who was seen today for physical therapy evaluation and treatment for weakness. He has an extensive medical history and multiple surgeries that has caused him to lose a lot of weight and muscle mass. His wife reports a number of falls since the beginning of the year but none in the past 3  months. Currently he is staying at an assisted living to work on ADLs and increasing his independence. He walks with a walker outside of his home. His wife reports he has a cane, but needs practice on how to use it. Pt reports he does some walking around the house without it. At their home, he has stairs up to the bedroom and bathrooms so would like to practice working on this. He does have neuropathy and reports high levels of pain at times in both feet. Patient will benefit from PT to increase his strength and allow him to improve functional independence and mobility.   OBJECTIVE IMPAIRMENTS: Abnormal gait, decreased activity tolerance, decreased balance, decreased coordination, decreased endurance, difficulty walking, decreased strength, decreased safety awareness, and pain.   ACTIVITY LIMITATIONS: carrying, lifting, squatting, stairs, transfers, and locomotion level  PARTICIPATION LIMITATIONS: meal prep, cleaning, laundry, driving, shopping, community activity, and yard work  PERSONAL FACTORS: Fitness, Past/current experiences, Time since onset of injury/illness/exacerbation, and 3+ comorbidities: istory of diabetes, neuropathy, hypertension, hyperlipidemia here for evaluation of memory, cognitive changes and generalized weakness are also affecting patient's functional outcome.   REHAB POTENTIAL: Good  CLINICAL DECISION MAKING: Stable/uncomplicated  EVALUATION COMPLEXITY: Low  GOALS: Goals reviewed with patient? Yes  SHORT TERM GOALS: Target date: 01/02/24  Patient will be independent with initial HEP. Baseline:  Goal status: MET 12/13/23  2.  Patient will demonstrate improved functional LE strength as demonstrated by 5xSTS <15s.  Baseline: 19s Goal status: INITIAL  3.  Patient will be educated on strategies to decrease risk of falls.  Baseline:  Goal status: IN PROGRESS 12/13/23   LONG TERM GOALS: Target date: 02/13/24  Patient will be independent with advanced/ongoing HEP to  improve outcomes and carryover.  Baseline:  Goal status: INITIAL  2.  Patient will be able to ambulate 500' without AD with good safety to access community.  Baseline: short distances without AD Goal status: INITIAL  3.  Patient will be able to navigate a set of stairs with reciprocal pattern for safety inside his home.  Baseline:  Goal status: INITIAL   4.  Patient will demonstrate decreased fall risk by scoring < 14 sec on TUG. Baseline: 22s without walker Goal status: INITIAL  PLAN:  PT FREQUENCY: 2x/week  PT DURATION: 12 weeks  PLANNED INTERVENTIONS: 97110-Therapeutic exercises, 97530- Therapeutic activity, 97112- Neuromuscular re-education, 97535- Self Care, 02859- Manual therapy, [phone]- Gait training, [phone]- Electrical stimulation (unattended), 97016- Vasopneumatic device, 20560 (1-2 muscles), 20561 (3+ muscles)- Dry Needling, Patient/Family education, Balance training, Stair training, Joint mobilization, Joint manipulation, Spinal manipulation, Spinal mobilization, Cryotherapy, and Moist heat  PLAN FOR NEXT SESSION: LE strengthening, gait training with cane, functional tasks, increase balance work and encourage ALF before home   Josette Rough, PT, DPT 01/03/24 1:54 PM

## 2024-01-03 NOTE — Therapy (Unsigned)
 OUTPATIENT SPEECH LANGUAGE PATHOLOGY TREATMENT   Patient Name: Andrew Tanner MRN: [id number] DOB:1950-06-01, 73 y.o., male Today's Date: 01/03/2024  PCP: Wendolyn Jenkins Jansky, MD  REFERRING PROVIDER: Wendolyn Jenkins Jansky, MD  END OF SESSION:  End of Session - 01/03/24 1232     Visit Number 6    Number of Visits 12    Date for Recertification  01/21/24    SLP Start Time 1230    SLP Stop Time  1310    SLP Time Calculation (min) 40 min    Activity Tolerance Patient tolerated treatment well          Past Medical History:  Diagnosis Date   Anxiety    Atrial myxoma    Basal cell carcinoma    Cancer (HCC)    basal cell skin CA   Cataract    Depression    Diabetes mellitus without complication (HCC)    Diabetic neuropathy (HCC)    Duodenal adenoma    Hyperlipidemia    Hypertension    IC (interstitial cystitis)    Neuromuscular disorder (HCC) 01/09/21   Peripheral  Neuropathy   Pancreatic lesion    Squamous cell carcinoma of skin    Past Surgical History:  Procedure Laterality Date   BIOPSY  12/05/2022   Procedure: BIOPSY;  Surgeon: Wilhelmenia Aloha Raddle., MD;  Location: [facility];  Service: Gastroenterology;;   CATARACT EXTRACTION, BILATERAL     ESOPHAGOGASTRODUODENOSCOPY N/A 12/05/2022   Procedure: ESOPHAGOGASTRODUODENOSCOPY (EGD);  Surgeon: Wilhelmenia Aloha Raddle., MD;  Location: [facility];  Service: Gastroenterology;  Laterality: N/A;   EUS N/A 12/05/2022   Procedure: UPPER ENDOSCOPIC ULTRASOUND (EUS) RADIAL;  Surgeon: Wilhelmenia Aloha Raddle., MD;  Location: [facility];  Service: Gastroenterology;  Laterality: N/A;   FINE NEEDLE ASPIRATION N/A 12/05/2022   Procedure: FINE NEEDLE ASPIRATION (FNA) LINEAR;  Surgeon: Wilhelmenia Aloha Raddle., MD;  Location: [facility];  Service: Gastroenterology;  Laterality: N/A;   HERNIA REPAIR N/A    inguinal   NECK SURGERY     ant/post   SPINE SURGERY     SUBMUCOSAL TATTOO INJECTION  12/05/2022   Procedure: SUBMUCOSAL TATTOO  INJECTION;  Surgeon: Wilhelmenia Aloha Raddle., MD;  Location: [facility];  Service: Gastroenterology;;   TOTAL HIP ARTHROPLASTY Left 07/13/2023   Procedure: ARTHROPLASTY, HIP, TOTAL, ANTERIOR APPROACH;  Surgeon: Kendal Franky SQUIBB, MD;  Location: [facility];  Service: Orthopedics;  Laterality: Left;  [employer] bipolar Hana table  hemi hip   TRANSURETHRAL RESECTION OF PROSTATE     TURP VAPORIZATION     WHIPPLE PROCEDURE N/A 04/27/2023   Procedure: WHIPPLE PROCEDURE;  Surgeon: Aron Shoulders, MD;  Location[facility];  Service: General;  Laterality: N/A;   Patient Active Problem List   Diagnosis Date Noted   Atrial myxoma 10/31/2023   History of left hip replacement 08/28/2023   Fracture of femoral neck, left (HCC) 07/13/2023   Protein-calorie malnutrition, severe 05/17/2023   Vomiting and diarrhea 05/13/2023   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   Malnutrition of moderate degree 05/08/2023   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   Poorly controlled type 2 diabetes mellitus with circulatory disorder (HCC) 02/08/2023   Vitamin D  deficiency 07/25/2022   Pain in both feet 02/10/2022   Peripheral neuropathy 01/25/2022   Seborrheic dermatitis 01/25/2022   Positive colorectal cancer screening using Cologuard test 03/16/2020   Chronic RLQ pain 03/16/2020   Type 2 diabetes mellitus with hyperglycemia, without long-term current use of insulin  (HCC)  06/02/2011   Hypertension 06/02/2011   Hyperlipemia 06/02/2011   Cataract 06/02/2011    ONSET DATE: Referred on  11/02/23  REFERRING DIAG: None provided; reportedly here for brain fog 2/2 anesthesia post surgeries  THERAPY DIAG:  Cognitive communication deficit  Rationale for Evaluation and Treatment: Rehabilitation  SUBJECTIVE:   SUBJECTIVE STATEMENT:  Pt will be heading to Assisted Living   Pt accompanied by: self and significant other; [name]  PERTINENT HISTORY: Per chart review:  73 year old male with history of diabetes, neuropathy, hypertension, hyperlipidemia here for evaluation of memory, cognitive changes and generalized weakness.   PAIN:  Are you having pain? Yes; 1/10 (soreness); neuropathy   FALLS: Has patient fallen in last 6 months?  Yes, Number of falls: 4   LIVING ENVIRONMENT: Lives with: lives in an adult home; [facility] Lives in: Group home; hoping to return home   *under Long term care   PLOF:  Level of assistance: Needed assistance with ADLs, Needed assistance with IADLS; two surgeries (whipple 04/2023 and hip 07/2023)  Employment: Retired; full Control and instrumentation engineer   PATIENT GOALS: to return back home  OBJECTIVE:  Note: Objective measures were completed at Evaluation unless otherwise noted.  DIAGNOSTIC FINDINGS: Per EMR  CT Head Wo Contrast  IMPRESSION: No CT evidence of acute intracranial abnormality.   Small right frontal scalp hematoma.   Unchanged pituitary/sellar mass.   No acute fracture or traumatic malalignment in the cervical spine.   Degenerative changes and postsurgical changes as above.   Similar appearance of left thyroid  nodule. Reiterate recommendation for thyroid  ultrasound if not previously performed.     Electronically Signed   By: Donnice Mania M.D.   On: 06/26/2023 09:33  COGNITION: Overall cognitive status: Impaired Areas of impairment:  Attention: Impaired: Selective, Alternating, Divided Memory: Impaired: Working Teacher, music term Prospective Auditory Awareness: Impaired: Intellectual Executive function: Impaired: Problem solving, Organization, Planning, Error awareness, Self-correction, and Slow processing Functional deficits: Poor safety awareness; aware of some deficits, but not of all. Wife reports more difficulty than pt shares.   COGNITIVE COMMUNICATION: Following directions: Follows multi-step commands inconsistently  Auditory comprehension: Impaired: 2/2 to attention/working memory Verbal  expression: WFL Functional communication: Impaired: see clinical impression  ORAL MOTOR EXAMINATION: Overall status: Did not assess Comments: NA  STANDARDIZED ASSESSMENTS:   Cognitive Linguistic Quick Test: AGE - 18 - 69   The Cognitive Linguistic Quick Test (CLQT) was administered to assess the relative status of five cognitive domains: attention, memory, language, executive functioning, and visuospatial skills. Scores from 10 tasks were used to estimate severity ratings (standardized for age groups 18-69 years and 70-89 years) for each domain, a clock drawing task, as well as an overall composite severity rating of cognition.       Task Score Criterion Cut Scores  Personal Facts 8/8 8  Symbol Cancellation 11/12 11  Confrontation Naming 10/10 10  Clock Drawing  12/13 12  Story Retelling 5/10 6  Symbol Trails 6/10 9  Generative Naming 4/9 5  Design Memory 4/6 5  Mazes  4/8 7  Design Generation 5/13 6     PATIENT REPORTED OUTCOME MEASURES (PROM): Neuro-QOL Cognitive Function:    Alm: 101/140   [name]: 69/130 (didn't answer 2 questions)  *wife scored pt more severely than pt scored himself.  TREATMENT DATE:   01/03/24: Pt was seen for skilled ST services targeting cognitive-communication. Discussed his move to assisted living - he feels like after talking to therapists and wife this would be beneficial for him. Pt has been using his notebook consistently - he reports being disorganized before and now he is feeling more organized. He has listed his routine and has been following. He has been dating his journal entries. He reports organizing his journal and having all information in one place has made him feel more confident. Continued targeting safety awareness. Required overall miNA.   12/20/23: Pt was seen for skilled ST services targeting  cognitive-communication. Pt reports he has been developing a system for him to keep track of tasks. Wife took his schedule and created a personal calendar for him. Both pt and wife feel his is making gains in cognition and taking safety more seriously. Pt completed problem solving HEP (~20 questions) with 1 error. He did have some difficulty with cognitive flexibility - finding two solutions to a single problem. SLP educated on the importance of managing fatigue. SLP assisted pt in establishing an organization system for his notebook. Pt was mixing to-do lists with a running commentary of notes. SLP encouraged pt to use titles for each section re: routine, to-dos, and notes; as well as, date his notes at the top of the page.   12/13/23: Pt was seen for skilled ST services targeting cognitive-communication. SLP asked pt to review previous session. Pt was able to recall 2 steps of Stop-Think-Plan-Do - but he does feel like he has been trying to pause and think. Wife reports she feels like Nachman has been making more focused effort to slow down. Pt is showing increased awareness of impairments and is providing examples of where he needs to be mindful re: looking for body language of when someone is going to move to help anticipate. He also reports he has begun writing things down to help support memory. SLP facilitated session by asking pt safety problem solving questions. Pt required overall minA to complete. Provided for HEP. Cont with current POC.   12/06/23: Pt was seen for skilled ST services targeting cognitive-communication. SLP initiated education on Stop, Think, Plan, Do to reduce impulsivity. SLP used personally-relevant example re: before going to get the mail. Pt perseverated on him falling on the ice one time when attempting to get the mail, and required gentle prompting to return to subject. SLP provided pt with a few safety scenarios to see how he answered each question. Pt would benefit from more  practice of this. Overall, pt did appear to have increased insight/awareness into deficits today. We discussed that even if you FEEL you can do something, we should always made safety a priority (I.e. using walker). Pt asked for walker before standing to leave in today's session.   11/29/23: Pt was seen for skilled ST services targeting continued evaluation. See above for CLQT and PROM scores. Pt continues to display decreased awareness of deficits, so SLP gently provided examples of error and discussed how we may fix them. Pt, wife, and SLP collaborated to identify goals for therapy. Goals have been updated to reflect. Throughout today's session, pt was observed to have difficulty with auditory comprehension. He was unable to follow conversation. He was asked a question and would answer with something off topic. He did mention he has always had trouble with finishing tasks, though it has gotten worse.     PATIENT EDUCATION: Education details: SLP role in  cognitive-communication Person educated: Patient and Spouse Education method: Explanation Education comprehension: verbalized understanding and needs further education   GOALS: Goals reviewed with patient? No  SHORT TERM GOALS: Target date: 12/23/23  Complete CLQT, PROM, and update goals Baseline: Goal status: MET  2.  Pt will identify the unsafe situation and provide 1 solution  Baseline:  Goal status: INITIAL  3.  Pt will recall the following steps re: STOP PLAN THINK DO with minA verbal cues Baseline:  Goal status: INITIAL  4.  Pt will recall 2 external memory strategies independently.  Baseline:  Goal status: INITIAL    LONG TERM GOALS: Target date: 01/22/24  Pt/wife will improve score on PROM Baseline:  Goal status: INITIAL  2.  Pt will demonstrate improved safety awareness by acknowledging cognitive impairments Baseline:  Goal status: INITIAL  3.  Pt will verbalize exact steps (meaning) of re: STOP THINK PLAN DO   Baseline:  Goal status: INITIAL  4.  Wife/pt will report successful use of memory strategies at home/in community.  Baseline:  Goal status: INITIAL    ASSESSMENT:  CLINICAL IMPRESSION: Pt is a 73 yo male who presents to ST OP for evaluation with complaints of cognitive changes post surgery. Pt was recently discharged from [facility] in June. Pt and wife endorse re: forgetfulness, difficulty retaining information, difficulty with prospective memory, and safety/judgement. Pt was assessed using CLQT - to complete next session. SLP observed reduced topic maintenance and perseveration on his art. For example, SLP asked what he feels like he struggles with at home and pt never answered the question, but expressed his interest in [employer]. Pt appeared to have decreased insight. It was suggested that wife attention sessions to assist with carryover of strategy. SLP rec skilled ST services to address cognitive-communication impairment to facilitate return to home. .     OBJECTIVE IMPAIRMENTS: include attention, memory, awareness, and executive functioning. These impairments are limiting patient from managing medications, managing appointments, managing finances, household responsibilities, ADLs/IADLs, and effectively communicating at home and in community. Factors affecting potential to achieve goals and functional outcome are ability to learn/carryover information.. Patient will benefit from skilled SLP services to address above impairments and improve overall function.  REHAB POTENTIAL: Good with wife support  PLAN:  SLP FREQUENCY: 1-2x/week  SLP DURATION: 8 weeks  PLANNED INTERVENTIONS: Environmental controls, Cueing hierachy, Cognitive reorganization, Internal/external aids, Functional tasks, Multimodal communication approach, SLP instruction and feedback, Compensatory strategies, Patient/family education, and [id number] Treatment of speech (30 or 45 min)     [employer], CCC-SLP 01/03/2024,  12:32 PM

## 2024-01-03 NOTE — Therapy (Signed)
 OUTPATIENT OCCUPATIONAL THERAPY NEURO treatment  Patient Name: Andrew Tanner MRN: [id number] DOB:1951-03-01, 73 y.o., male Today's Date: 01/03/2024  PCP: Dr. Adaline  REFERRING PROVIDER: Dr. Adaline  END OF SESSION:  OT End of Session - 01/03/24 1112     Visit Number 6    Number of Visits 25    Date for Recertification  02/07/24    Authorization Type BC[employer] Medicare    Authorization - Visit Number 6    Progress Note Due on Visit 10    OT Start Time 1103    OT Stop Time 1143    OT Time Calculation (min) 40 min            Past Medical History:  Diagnosis Date   Anxiety    Atrial myxoma    Basal cell carcinoma    Cancer (HCC)    basal cell skin CA   Cataract    Depression    Diabetes mellitus without complication (HCC)    Diabetic neuropathy (HCC)    Duodenal adenoma    Hyperlipidemia    Hypertension    IC (interstitial cystitis)    Neuromuscular disorder (HCC) 01/09/21   Peripheral  Neuropathy   Pancreatic lesion    Squamous cell carcinoma of skin    Past Surgical History:  Procedure Laterality Date   BIOPSY  12/05/2022   Procedure: BIOPSY;  Surgeon: Wilhelmenia Aloha Raddle., MD;  Location: [facility];  Service: Gastroenterology;;   CATARACT EXTRACTION, BILATERAL     ESOPHAGOGASTRODUODENOSCOPY N/A 12/05/2022   Procedure: ESOPHAGOGASTRODUODENOSCOPY (EGD);  Surgeon: Wilhelmenia Aloha Raddle., MD;  Location: [facility];  Service: Gastroenterology;  Laterality: N/A;   EUS N/A 12/05/2022   Procedure: UPPER ENDOSCOPIC ULTRASOUND (EUS) RADIAL;  Surgeon: Wilhelmenia Aloha Raddle., MD;  Location: [facility];  Service: Gastroenterology;  Laterality: N/A;   FINE NEEDLE ASPIRATION N/A 12/05/2022   Procedure: FINE NEEDLE ASPIRATION (FNA) LINEAR;  Surgeon: Wilhelmenia Aloha Raddle., MD;  Location: [facility];  Service: Gastroenterology;  Laterality: N/A;   HERNIA REPAIR N/A    inguinal   NECK SURGERY     ant/post   SPINE SURGERY     SUBMUCOSAL TATTOO INJECTION  12/05/2022    Procedure: SUBMUCOSAL TATTOO INJECTION;  Surgeon: Wilhelmenia Aloha Raddle., MD;  Location: [facility];  Service: Gastroenterology;;   TOTAL HIP ARTHROPLASTY Left 07/13/2023   Procedure: ARTHROPLASTY, HIP, TOTAL, ANTERIOR APPROACH;  Surgeon: Kendal Franky SQUIBB, MD;  Location: [facility] OR;  Service: Orthopedics;  Laterality: Left;  [employer] bipolar Hana table  hemi hip   TRANSURETHRAL RESECTION OF PROSTATE     TURP VAPORIZATION     WHIPPLE PROCEDURE N/A 04/27/2023   Procedure: WHIPPLE PROCEDURE;  Surgeon: Aron Shoulders, MD;  Location: [facility];  Service: General;  Laterality: N/A;   Patient Active Problem List   Diagnosis Date Noted   Atrial myxoma 10/31/2023   History of left hip replacement 08/28/2023   Fracture of femoral neck, left (HCC) 07/13/2023   Protein-calorie malnutrition, severe 05/17/2023   Vomiting and diarrhea 05/13/2023   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   Malnutrition of moderate degree 05/08/2023   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   Poorly controlled type 2 diabetes mellitus with circulatory disorder (HCC) 02/08/2023   Vitamin D  deficiency 07/25/2022   Pain in both feet 02/10/2022   Peripheral neuropathy 01/25/2022   Seborrheic dermatitis 01/25/2022   Positive colorectal cancer screening using Cologuard test 03/16/2020   Chronic RLQ pain 03/16/2020   Type  2 diabetes mellitus with hyperglycemia, without long-term current use of insulin  (HCC) 06/02/2011   Hypertension 06/02/2011   Hyperlipemia 06/02/2011   Cataract 06/02/2011    ONSET DATE: referral 10/02/23  REFERRING DIAG: Generalized weakness, Idiopathy peripheral neuropathy  L hip replacement   THERAPY DIAG:  Other lack of coordination  Other abnormalities of gait and mobility  Muscle weakness (generalized)  Frontal lobe and executive function deficit  Attention and concentration deficit  Rationale for Evaluation and Treatment:  Rehabilitation  SUBJECTIVE:   SUBJECTIVE STATEMENT: Pt reports he has done several loads of wash Pt accompanied by: significant other  PERTINENT HISTORY:Pt is a 73 y.o. male admitted via EMS 07/12/23 for fall at rehab facility. X-ray showed L femoral neck fx. L THA performed 4/3. 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 Yes: NPRS scale: 3/10 Pain location: LLE Pain description: aching Aggravating factors: malpositioning Relieving factors: repositioning  FALLS: Has patient fallen in last 6 months? Yes. Number of falls multiple  LIVING ENVIRONMENT: Lives with: lives with their family and lives in an adult home, pt is staying in a home with personal care assistance since he can't climb stairs at home Lives in: House/apartment Stairs: No Has following equipment at home: Walker - 4 wheeled and shower chair  PLOF: Independent  PATIENT GOALS: improve independence, move back home  OBJECTIVE:  Note: Objective measures were completed at Evaluation unless otherwise noted.  HAND DOMINANCE: Right  ADLs:has assist for starting shower, pt performs  with supervision Overall ADLs: increased time required Transfers/ambulation related to ADLs: Eating: mod I  UB Dressing: supervision LB Dressing: supervision Toileting: mod I with walker Bathing: supervision Tub Shower transfers: has tub seat, min A Equipment: Shower seat with back  IADLs:Dependent for IADLs  Handwriting: 100% legible  MOBILITY STATUS: supervision     ACTIVITY TOLERANCE: Activity tolerance: decreased overall endurance   UPPER EXTREMITY ROM:  A/ROM WFLs    UPPER EXTREMITY MMT:     MMT Right eval Left eval   Shoulder flexion 4-/5 4-/5  Shoulder abduction    Shoulder adduction    Shoulder extension    Shoulder internal rotation    Shoulder external rotation    Middle trapezius    Lower trapezius    Elbow flexion 4/5 4/5  Elbow extension 4/5 4/5  Wrist flexion    Wrist extension    Wrist ulnar deviation    Wrist radial deviation    Wrist pronation    Wrist supination    (Blank rows = not tested)  HAND FUNCTION: Grip strength: Right: 75 lbs; Left: 70 lbs  COORDINATION: 9 Hole Peg test: Right: 36.84 sec; Left: 35.46 sec, tremors and increased difficulty  SENSATION: Not tested    COGNITION: Overall cognitive status: decreased short term memory, correctly completes trail making A, unable to correctly complete trail making B- decreased alternating attention    VISION ASSESSMENT:NT, wears glasses Pt  was noted to bump into door frame x 2 with walker, vision to be further assessed within a functional context.     OBSERVATIONS:  11/15/23-Plesant gentleman with complex medical history accompanied by his wife. Pt is and artist.Pt is living in an adult care home since he can't navigate the stairs at home.  TREATMENT DATE:01/03/24- Discussion with pt/ wife regarding pt's potential transition to ALF. Pt is adamantly opposed and wants to go home. Pt needs to be more I and able to climb a flight of stairs mod I. Pt was encouraged to onsider the ALF as he is currently living in a group home. He would have better socialization, meals for DM and access to therapy on site. Reviewed memory strategies that pt is using. Funational dynamic activities in standing: weightbearing through bilateral UE's rocking forwards and backwards then countetop pushups for management of tremors. copying small peg design on vertical surface with left and right UE's mod-max diffity due to  tremors. Pt stood for 10 mins total prior to rest, and he transitioned to working on Estate agent on counetertop and table  with min-mod difficulty and v.c for compensation of tremors.   12/26/23 UBE x 6 mins level 3 for conditioning  Pt reports he has washed 3 loads of clothing since last visit and has made his bed initally with supervision. Pt generated a list of steps involved in making an egg sandwich. Therapist provided recommendations. Pt initally only wrote out the steps involved in making an egg, min-mod v.c to add additional details and complete task. Therapist recommended pt gathers all items prior to starting task. Therpist discussed safety with task and need for supervision initally. Discussed strategies for thoroughness with bathing and using a system with pt/ wife. Pt's wife to discuss with caregiver. Reveiwed red theraband HEP, mod v.c 10-15 reps each.   12/20/23- Discussed activities pt can begin performing to maximize I with ADLs/IADLs. (Pt has been very focused on his artwork and is doing well with the artwork). Pt to start performing laundry at his group home and possibly simple cooking with supervision. Pt simulated carrying pillowcase full of dirty laundry to and from target and unloading with supervision. Pt wrote out steps involved in perfroming laundry to organize task. Pt wrote out steps however it was initally for what he would do in his home.Pt is currently living in a group home and situation would be different. Pt then wrote out steps he would need to do to perfrom task at group home. Pt omitted several steps and required mod v.c to add aditional steps. Pt to write out organized list for home work.  Standing at countertop to perfrom dynamic functional reaching with left and right UE's with close supervision and min v.c  12/13/23- memory/ cognitve tips education, see pt instructions Pt/ wife were instructed in red theraband HEP, min-mod v.c and demonstration, 10-15 reps each  bilateral UE's  12/06/23- Pt/ wife were instructed in HEP for coordination, see pt instructions. Pt practiced printing using foam grip and foam grip was issued for handwriting. Pt demonstrates improved printing with v.c to slow down and form letters deliberately. Pt was cued to rest arms on table for improving tremors. Pt was instructed in flicks. 11/15/23- eval         PATIENT EDUCATION: Education details:  see above Person educated: Patient and Spouse Education method: Explanation, demonstration, v.c,  Education comprehension: verbalized understanding, returned demonstration,  HOME EXERCISE PROGRAM: coordiantion, writing 12/13/23- red t band, memory/ cognitve strategies   GOALS: Goals reviewed with patient? Yes  SHORT TERM GOALS: Target date: 12/16/23  I with inital HEP.  Goal status: ongoing  01/03/24 needs reinforcement  2.  Pt will verbalize understanding of memory compensations.  Goal status: met., 01/03/24  3.  Pt will perform an alternating attention task with 80% accuracy for  increased ease with IADLs.  Goal status:  ongoing 01/03/24  4.  Pt will perfrom dynamic activities in standing x 10 mins without LOB for increaed ease with ADLs/IADLs.  Goal status:  met, Pt stood for 10 mins today  5.  Pt will perform an organization/ scheduling task with 90% or better accuracy (plan meunu/ grocery list, organizing your day task)  Goal status:  ongoing 12/26/23- min-mod v.c for organization task today    LONG TERM GOALS: Target date: 02/07/24  I with updated HEP. Baseline:  Goal status: INITIAL  2.  Pt will perform simple snack or sandwich prep mod I.  Goal status: INITIAL  3.  Pt will perform simple IADL tasks such as folding laundry and washing dishes swith supervision.  Goal status: INITIAL  4.  Pt will navigate a busy environment without bumping into items and will locate items with 90% or better accuracy. Baseline:  Goal status: INITIAL  5.  Pt will  demonstrate improved alternating attention as evidenced by completing trailmaking B correctly in 3 mins or less.  Goal status: INITIAL    ASSESSMENT:  CLINICAL IMPRESSION: Pt is progressing towards goals.Pt demonstrates improving balance and standing tolerance.PERFORMANCE DEFICITS: in functional skills including ADLs, IADLs, coordination, dexterity, strength, pain, flexibility, Fine motor control, Gross motor control, mobility, balance, endurance, decreased knowledge of precautions, decreased knowledge of use of DME, vision, and UE functional use, cognitive skills including attention, memory, problem solving, safety awareness, sequencing, and temperament/personality, and psychosocial skills including coping strategies, environmental adaptation, habits, interpersonal interactions, and routines and behaviors.   IMPAIRMENTS: are limiting patient from ADLs, IADLs, work, play, leisure, and social participation.   CO-MORBIDITIES: may have co-morbidities  that affects occupational performance. Patient will benefit from skilled OT to address above impairments and improve overall function.  MODIFICATION OR ASSISTANCE TO COMPLETE EVALUATION: No modification of tasks or assist necessary to complete an evaluation.  OT OCCUPATIONAL PROFILE AND HISTORY: Detailed assessment: Review of records and additional review of physical, cognitive, psychosocial history related to current functional performance.  CLINICAL DECISION MAKING: LOW - limited treatment options, no task modification necessary  REHAB POTENTIAL: Good  EVALUATION COMPLEXITY: Low    PLAN:  OT FREQUENCY: 1-2x/week  OT DURATION: 12 weeks  PLANNED INTERVENTIONS: 97168 OT Re-evaluation, 97535 self care/ADL training, 02889 therapeutic exercise, 97530 therapeutic activity, 97112 neuromuscular re-education, 97140 manual therapy, 97035 ultrasound, 97018 paraffin, 02989 moist heat, 97010 cryotherapy, 97129 Cognitive training (first 15 min), 02869  Cognitive training(each additional 15 min), balance training, functional mobility training, visual/perceptual remediation/compensation, psychosocial skills training, energy conservation, coping strategies training, patient/family education, and DME and/or AE instructions  RECOMMENDED OTHER SERVICES: PT, ST  CONSULTED AND AGREED WITH PLAN OF CARE: Patient and family member/caregiver  PLAN FOR NEXT SESSION:   review HEP, organization/ alternating attention   Diyari Cherne, OT 01/03/2024, 11:16 AM

## 2024-01-04 DIAGNOSIS — R350 Frequency of micturition: Secondary | ICD-10-CM | POA: Diagnosis not present

## 2024-01-04 DIAGNOSIS — R3915 Urgency of urination: Secondary | ICD-10-CM | POA: Diagnosis not present

## 2024-01-05 DIAGNOSIS — F3341 Major depressive disorder, recurrent, in partial remission: Secondary | ICD-10-CM | POA: Diagnosis not present

## 2024-01-08 ENCOUNTER — Ambulatory Visit: Payer: Medicare Other

## 2024-01-08 VITALS — Ht 71.0 in | Wt 152.0 lb

## 2024-01-08 DIAGNOSIS — Z0000 Encounter for general adult medical examination without abnormal findings: Secondary | ICD-10-CM | POA: Diagnosis not present

## 2024-01-08 NOTE — Patient Instructions (Signed)
 [name],  Thank you for taking the time for your Medicare Wellness Visit. I appreciate your continued commitment to your health goals. Please review the care plan we discussed, and feel free to reach out if I can assist you further.  Medicare recommends these wellness visits once per year to help you and your care team stay ahead of potential health issues. These visits are designed to focus on prevention, allowing your provider to concentrate on managing your acute and chronic conditions during your regular appointments.  Please note that Annual Wellness Visits do not include a physical exam. Some assessments may be limited, especially if the visit was conducted virtually. If needed, we may recommend a separate in-person follow-up with your provider.  Ongoing Care Seeing your primary care provider every 3 to 6 months helps us  monitor your health and provide consistent, personalized care.   Referrals If a referral was made during today's visit and you haven't received any updates within two weeks, please contact the referred provider directly to check on the status.  Recommended Screenings:  Health Maintenance  Topic Date Due   Zoster (Shingles) Vaccine (1 of 2) Never done   Cologuard (Stool DNA test)  02/05/2023   Eye exam for diabetics  08/16/2023   Flu Shot  11/10/2023   Yearly kidney health urinalysis for diabetes  03/13/2024   Complete foot exam   03/13/2024   Hemoglobin A1C  04/24/2024   Yearly kidney function blood test for diabetes  12/26/2024   Medicare Annual Wellness Visit  01/07/2025   DTaP/Tdap/Td vaccine (3 - Td or Tdap) 06/25/2033   Pneumococcal Vaccine for age over 82  Completed   Hepatitis C Screening  Completed   HPV Vaccine  Aged Out   Meningitis B Vaccine  Aged Out   Colon Cancer Screening  Discontinued   COVID-19 Vaccine  Discontinued       01/08/2024    1:21 PM  Advanced Directives  Does Patient Have a Medical Advance Directive? Yes  Type of  Estate agent of [place];Living will  Copy of Healthcare Power of Attorney in Chart? No - copy requested   Advance Care Planning is important because it: Ensures you receive medical care that aligns with your values, goals, and preferences. Provides guidance to your family and loved ones, reducing the emotional burden of decision-making during critical moments.  Vision: Annual vision screenings are recommended for early detection of glaucoma, cataracts, and diabetic retinopathy. These exams can also reveal signs of chronic conditions such as diabetes and high blood pressure.  Dental: Annual dental screenings help detect early signs of oral cancer, gum disease, and other conditions linked to overall health, including heart disease and diabetes.  Please see the attached documents for additional preventive care recommendations.

## 2024-01-08 NOTE — Progress Notes (Signed)
 Subjective:   Coley Kulikowski is a 73 y.o. who presents for a Medicare Wellness preventive visit.  As a reminder, Annual Wellness Visits don't include a physical exam, and some assessments may be limited, especially if this visit is performed virtually. We may recommend an in-person follow-up visit with your provider if needed.  Visit Complete: Virtual I connected with  Alm Ram on 01/08/24 by a audio enabled telemedicine application and verified that I am speaking with the correct person using two identifiers.  Patient Location: Home  Provider Location: Office/Clinic  I discussed the limitations of evaluation and management by telemedicine. The patient expressed understanding and agreed to proceed.  Vital Signs: Because this visit was a virtual/telehealth visit, some criteria may be missing or patient reported. Any vitals not documented were not able to be obtained and vitals that have been documented are patient reported.  VideoError- Librarian, academic were attempted between this provider and patient, however failed, due to patient having technical difficulties OR patient did not have access to video capability.  We continued and completed visit with audio only.   Persons Participating in Visit: Patient.  AWV Questionnaire: No: Patient Medicare AWV questionnaire was not completed prior to this visit.  Cardiac Risk Factors include: advanced age (>89men, >34 women);dyslipidemia;diabetes mellitus;male gender;hypertension     Objective:    Today's Vitals   01/08/24 1315  Weight: 152 lb (68.9 kg)  Height: 5' 11 (1.803 m)   Body mass index is 21.2 kg/m.     01/08/2024    1:21 PM 11/21/2023    1:15 PM 11/15/2023   11:16 AM 07/13/2023    3:34 AM 06/20/2023    5:10 PM 05/13/2023    6:59 PM 04/27/2023    6:00 PM  Advanced Directives  Does Patient Have a Medical Advance Directive? Yes Yes Yes Yes No No No  Type of Engineer, mining of [place];Living will Healthcare Power of [place];Living will;Out of facility DNR (pink MOST or yellow form) Healthcare Power of [place];Living will;Out of facility DNR (pink MOST or yellow form) Living will     Does patient want to make changes to medical advance directive?   Yes (Inpatient - patient defers changing a medical advance directive and declines information at this time) No - Patient declined     Copy of Healthcare Power of Attorney in Chart? No - copy requested        Would patient like information on creating a medical advance directive?     Yes (ED - send information to MyChart) No - Guardian declined No - Patient declined    Current Medications (verified) Outpatient Encounter Medications as of 01/08/2024  Medication Sig   acetaminophen  (TYLENOL ) 500 MG tablet Take 2 tablets (1,000 mg total) by mouth every 6 (six) hours as needed for mild pain (pain score 1-3).   alfuzosin  (UROXATRAL ) 10 MG 24 hr tablet Take 1 tablet (10 mg total) by mouth daily with breakfast.   amitriptyline (ELAVIL) 10 MG tablet Take 10 mg by mouth at bedtime.   aspirin  EC 81 MG tablet Take 1 tablet (81 mg total) by mouth daily. Swallow whole.   clonazePAM  (KLONOPIN ) 0.5 MG tablet Take 1.5 mg by mouth at bedtime.   Continuous Glucose Sensor (DEXCOM G7 SENSOR) MISC Use to monitor glucose continuously, change every 10 days   empagliflozin  (JARDIANCE ) 10 MG TABS tablet Take 1 tablet (10 mg total) by mouth daily before breakfast.   EQ SENNA-S 8.6-50 MG tablet  Take 1 tablet by mouth daily.   ferrous sulfate  (FEROSUL) 325 (65 FE) MG tablet Take 325 mg by mouth 2 (two) times daily.   fluconazole  (DIFLUCAN ) 150 MG tablet Take 1 tablet (150 mg total) by mouth as directed. Take one tablet now then 1 tablet in 7 days   FLUoxetine  (PROZAC ) 20 MG capsule Take 20 mg by mouth daily.   glucose blood test strip 1 each by Other route in the morning and at bedtime. Use as instructed checking once per day, or if new  symptoms.   hydrOXYzine  (ATARAX /VISTARIL ) 50 MG tablet Take 50 mg by mouth at bedtime as needed (sleep/anxiety.).   insulin  glargine (LANTUS  SOLOSTAR) 100 UNIT/ML Solostar Pen Inject 14 Units into the skin daily.   Insulin  Lispro-aabc (LYUMJEV  KWIKPEN) 100 UNIT/ML KwikPen Inject 7 Units into the skin 3 (three) times daily before meals.   Insulin  Pen Needle 32G X 4 MM MISC Use 2x a day   ketoconazole  (NIZORAL ) 2 % shampoo APPLY TO SCALP AND FACE 2 TO 3 TIMES A WEEK.   lipase/protease/amylase (CREON ) 36000 UNITS CPEP capsule Take 2 capsules (72,000 Units total) by mouth 3 (three) times daily before meals.   megestrol  (MEGACE ) 40 MG/ML suspension Take 10 mLs by mouth daily.   nystatin -triamcinolone  ointment (MYCOLOG) Apply 1 Application topically 2 (two) times daily.   pantoprazole  (PROTONIX ) 40 MG tablet Take 40 mg by mouth daily.   polyethylene glycol (MIRALAX  / GLYCOLAX ) 17 g packet Take 17 g by mouth daily as needed (constipation.).   QUEtiapine (SEROQUEL) 25 MG tablet Take 25 mg by mouth at bedtime.   rosuvastatin  (CRESTOR ) 20 MG tablet Take 20 mg by mouth daily.   Vibegron (GEMTESA) 75 MG TABS Take by mouth.   No facility-administered encounter medications on file as of 01/08/2024.    Allergies (verified) Patient has no known allergies.   History: Past Medical History:  Diagnosis Date   Anxiety    Atrial myxoma    Basal cell carcinoma    Cancer (HCC)    basal cell skin CA   Cataract    Depression    Diabetes mellitus without complication (HCC)    Diabetic neuropathy (HCC)    Duodenal adenoma    Hyperlipidemia    Hypertension    IC (interstitial cystitis)    Neuromuscular disorder (HCC) 01/09/21   Peripheral  Neuropathy   Pancreatic lesion    Squamous cell carcinoma of skin    Past Surgical History:  Procedure Laterality Date   BIOPSY  12/05/2022   Procedure: BIOPSY;  Surgeon: Wilhelmenia Aloha Raddle., MD;  Location: [facility];  Service: Gastroenterology;;   CATARACT  EXTRACTION, BILATERAL     ESOPHAGOGASTRODUODENOSCOPY N/A 12/05/2022   Procedure: ESOPHAGOGASTRODUODENOSCOPY (EGD);  Surgeon: Wilhelmenia Aloha Raddle., MD;  Location: [facility];  Service: Gastroenterology;  Laterality: N/A;   EUS N/A 12/05/2022   Procedure: UPPER ENDOSCOPIC ULTRASOUND (EUS) RADIAL;  Surgeon: Wilhelmenia Aloha Raddle., MD;  Location: [facility];  Service: Gastroenterology;  Laterality: N/A;   FINE NEEDLE ASPIRATION N/A 12/05/2022   Procedure: FINE NEEDLE ASPIRATION (FNA) LINEAR;  Surgeon: Wilhelmenia Aloha Raddle., MD;  Location: [facility];  Service: Gastroenterology;  Laterality: N/A;   HERNIA REPAIR N/A    inguinal   NECK SURGERY     ant/post   SPINE SURGERY     SUBMUCOSAL TATTOO INJECTION  12/05/2022   Procedure: SUBMUCOSAL TATTOO INJECTION;  Surgeon: Wilhelmenia Aloha Raddle., MD;  Location: [facility];  Service: Gastroenterology;;   TOTAL HIP ARTHROPLASTY Left  07/13/2023   Procedure: ARTHROPLASTY, HIP, TOTAL, ANTERIOR APPROACH;  Surgeon: Kendal Franky SQUIBB, MD;  Location: [facility];  Service: Orthopedics;  Laterality: Left;  [employer] bipolar Hana table  hemi hip   TRANSURETHRAL RESECTION OF PROSTATE     TURP VAPORIZATION     WHIPPLE PROCEDURE N/A 04/27/2023   Procedure: WHIPPLE PROCEDURE;  Surgeon: Aron Shoulders, MD;  Location: [facility];  Service: General;  Laterality: N/A;   Family History  Problem Relation Age of Onset   Lung cancer Mother    Heart disease Father    Hyperlipidemia Father    Diabetes Father        type 1   Diabetes Brother    Depression Brother    Renal cancer Brother    Anxiety disorder Brother    Stomach cancer Neg Hx    Colon cancer Neg Hx    Pancreatic cancer Neg Hx    Esophageal cancer Neg Hx    Rectal cancer Neg Hx    Social History   Socioeconomic History   Marital status: Married    Spouse name: [name] Speed   Number of children: 1   Years of education: Not on file   Highest education level: Bachelor's degree (e.g., BA, AB, BS)   Occupational History   Not on file  Tobacco Use   Smoking status: Former    Current packs/day: 0.00    Average packs/day: 1 pack/day for 5.0 years (5.0 ttl pk-yrs)    Types: Cigarettes    Start date: 03/02/1977    Quit date: 03/02/1982    Years since quitting: 41.8   Smokeless tobacco: Never   Tobacco comments:    quit 28 yrs ago  Vaping Use   Vaping status: Never Used  Substance and Sexual Activity   Alcohol use: Not Currently    Comment: none   Drug use: No   Sexual activity: Not Currently    Birth control/protection: Abstinence  Other Topics Concern   Not on file  Social History Narrative   Retired Risk analyst, 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   Social Drivers of Corporate investment banker Strain: Low Risk  (01/08/2024)   Overall Financial Resource Strain (CARDIA)    Difficulty of Paying Living Expenses: Not hard at all  Food Insecurity: No Food Insecurity (01/08/2024)   Hunger Vital Sign    Worried About Running Out of Food in the Last Year: Never true    Ran Out of Food in the Last Year: Never true  Transportation Needs: No Transportation Needs (01/08/2024)   PRAPARE - Administrator, Civil Service (Medical): No    Lack of Transportation (Non-Medical): No  Physical Activity: Sufficiently Active (01/08/2024)   Exercise Vital Sign    Days of Exercise per Week: 5 days    Minutes of Exercise per Session: 50 min  Stress: No Stress Concern Present (01/08/2024)   [employer] of Occupational Health - Occupational Stress Questionnaire    Feeling of Stress: Not at all  Social Connections: Socially Integrated (01/08/2024)   Social Connection and Isolation Panel    Frequency of Communication with Friends and Family: More than three times a week    Frequency of Social Gatherings with Friends and Family: Three times a week    Attends Religious Services: More than 4 times per year    Active Member of Clubs or Organizations: Yes    Attends Tax inspector Meetings: 1 to 4 times per year  Marital Status: Married    Tobacco Counseling Counseling given: Not Answered Tobacco comments: quit 28 yrs ago    Clinical Intake:  Pre-visit preparation completed: Yes  Pain : No/denies pain     BMI - recorded: 21.2 Nutritional Status: BMI of 19-24  Normal Nutritional Risks: None Diabetes: Yes CBG done?: Yes (130 per pt) CBG resulted in Enter/ Edit results?: No Did pt. bring in CBG monitor from home?: No  Lab Results  Component Value Date   HGBA1C 6.3 (A) 10/23/2023   HGBA1C 7.3 (A) 08/11/2023   HGBA1C 6.8 (H) 04/21/2023     How often do you need to have someone help you when you read instructions, pamphlets, or other written materials from your doctor or pharmacy?: 1 - Never  Interpreter Needed?: No  Information entered by :: Ellouise Haws, LPN   Activities of Daily Living     01/08/2024    1:17 PM 07/14/2023    8:30 PM  In your present state of health, do you have any difficulty performing the following activities:  Hearing? 0 0  Vision? 0 0  Difficulty concentrating or making decisions? 0 0  Walking or climbing stairs? 1   Comment with assistance   Dressing or bathing? 0   Doing errands, shopping? 0   Preparing Food and eating ? Y   Comment working with OT   Using the Toilet? N   In the past six months, have you accidently leaked urine? N   Do you have problems with loss of bowel control? N   Managing your Medications? N   Managing your Finances? N   Housekeeping or managing your Housekeeping? N     Patient Care Team: Wendolyn Jenkins Jansky, MD as PCP - General (Family Medicine) Elmira Newman PARAS, MD as PCP - Cardiology (Cardiology) Darcie Fallow, OD as Referring Physician (Optometry)  I have updated your Care Teams any recent Medical Services you may have received from other providers in the past year.     Assessment:   This is a routine wellness examination for Jerrod.  Hearing/Vision  screen Hearing Screening - Comments:: Pt denies any hearing issues  Vision Screening - Comments:: Wears rx glasses - up to date with routine eye exams with [facility]    Mattie Addressed             This Visit's Progress    Patient Stated       Transfer to a step up in [place]        Depression Screen     01/08/2024    1:23 PM 12/27/2023    4:31 PM 03/20/2023    9:56 AM 01/16/2023   10:34 AM 01/03/2023    1:53 PM 11/21/2022    8:24 AM 07/25/2022    8:25 AM  PHQ 2/9 Scores  PHQ - 2 Score 0 2 1 2  0 2 2  PHQ- 9 Score 0 5 1 3  0 3 5    Fall Risk     01/08/2024    1:26 PM 12/27/2023    4:33 PM 03/20/2023    9:56 AM 01/16/2023   10:32 AM 01/03/2023    1:56 PM  Fall Risk   Falls in the past year? 1 1 1 1 1   Number falls in past yr: 1 1 1 1 1   Injury with Fall? 1 1 1 1 1   Comment hip injury    ER visit  Risk for fall due to : History of fall(s);Impaired  balance/gait;Impaired mobility Impaired balance/gait Impaired balance/gait Impaired balance/gait Impaired vision  Follow up Falls prevention discussed Falls evaluation completed Falls prevention discussed Falls prevention discussed Falls prevention discussed    MEDICARE RISK AT HOME:  Medicare Risk at Home Any stairs in or around the home?: No If so, are there any without handrails?: No Home free of loose throw rugs in walkways, pet beds, electrical cords, etc?: Yes Adequate lighting in your home to reduce risk of falls?: Yes Life alert?: Yes Use of a cane, walker or w/c?: Yes Grab bars in the bathroom?: Yes Shower chair or bench in shower?: Yes Elevated toilet seat or a handicapped toilet?: Yes  TIMED UP AND GO:  Was the test performed?  No  Cognitive Function: 6CIT completed      11/13/2023    2:50 PM  Montreal Cognitive Assessment   Visuospatial/ Executive (0/5) 3  Naming (0/3) 3  Attention: Read list of digits (0/2) 2  Attention: Read list of letters (0/1) 1  Attention: Serial 7 subtraction starting at 100  (0/3) 2  Language: Repeat phrase (0/2) 1  Language : Fluency (0/1) 1  Abstraction (0/2) 2  Delayed Recall (0/5) 3  Orientation (0/6) 6  Total 24      01/08/2024    1:27 PM 01/03/2023    1:57 PM 12/17/2021    8:18 AM  6CIT Screen  What Year? 0 points 0 points 0 points  What month? 0 points 0 points 0 points  What time? 0 points 0 points 0 points  Count back from 20 0 points 0 points 0 points  Months in reverse 0 points 0 points 0 points  Repeat phrase 0 points 0 points 0 points  Total Score 0 points 0 points 0 points    Immunizations Immunization History  Administered Date(s) Administered   Fluad Quad(high Dose 65+) 12/31/2018, 01/20/2020, 12/21/2020, 01/25/2022   INFLUENZA, HIGH DOSE SEASONAL PF 01/01/2018   Influenza Split 01/13/2011   Influenza,inj,Quad PF,6+ Mos 02/21/2013, 03/10/2014, 02/16/2017   Moderna Sars-Covid-2 Vaccination 05/20/2019, 06/17/2019   Pneumococcal Conjugate-13 02/16/2017   Pneumococcal Polysaccharide-23 12/31/2018   Tdap 11/22/2012, 06/26/2023    Screening Tests Health Maintenance  Topic Date Due   Zoster Vaccines- Shingrix (1 of 2) Never done   Fecal DNA (Cologuard)  02/05/2023   OPHTHALMOLOGY EXAM  08/16/2023   Influenza Vaccine  11/10/2023   Diabetic kidney evaluation - Urine ACR  03/13/2024   FOOT EXAM  03/13/2024   HEMOGLOBIN A1C  04/24/2024   Diabetic kidney evaluation - eGFR measurement  12/26/2024   Medicare Annual Wellness (AWV)  01/07/2025   DTaP/Tdap/Td (3 - Td or Tdap) 06/25/2033   Pneumococcal Vaccine: 50+ Years  Completed   Hepatitis C Screening  Completed   HPV VACCINES  Aged Out   Meningococcal B Vaccine  Aged Out   Colonoscopy  Discontinued   COVID-19 Vaccine  Discontinued    Health Maintenance Items Addressed: See Nurse Notes at the end of this note  Additional Screening:  Vision Screening: Recommended annual ophthalmology exams for early detection of glaucoma and other disorders of the eye. Is the patient up to date  with their annual eye exam?  No  Who is the provider or what is the name of the office in which the patient attends annual eye exams? [facility]   Dental Screening: Recommended annual dental exams for proper oral hygiene  Community Resource Referral / Chronic Care Management: CRR required this visit?  No   CCM required  this visit?  No   Plan:    I have personally reviewed and noted the following in the patient's chart:   Medical and social history Use of alcohol, tobacco or illicit drugs  Current medications and supplements including opioid prescriptions. Patient is not currently taking opioid prescriptions. Functional ability and status Nutritional status Physical activity Advanced directives List of other physicians Hospitalizations, surgeries, and ER visits in previous 12 months Vitals Screenings to include cognitive, depression, and falls Referrals and appointments  In addition, I have reviewed and discussed with patient certain preventive protocols, quality metrics, and best practice recommendations. A written personalized care plan for preventive services as well as general preventive health recommendations were provided to patient.   Ellouise VEAR Haws, LPN   [id number]   After Visit Summary: (MyChart) Due to this being a telephonic visit, the after visit summary with patients personalized plan was offered to patient via MyChart   Notes: Nothing significant to report at this time. Pt stated he is being followed up by gastrologist concerning cologuard and will follow up with eye dr for appt. Pt will follow up vaccinations.

## 2024-01-09 ENCOUNTER — Encounter: Payer: Self-pay | Admitting: Speech Pathology

## 2024-01-09 ENCOUNTER — Ambulatory Visit: Admitting: Speech Pathology

## 2024-01-09 ENCOUNTER — Ambulatory Visit: Admitting: Occupational Therapy

## 2024-01-09 ENCOUNTER — Encounter: Payer: Self-pay | Admitting: Occupational Therapy

## 2024-01-09 ENCOUNTER — Ambulatory Visit: Admitting: Physical Therapy

## 2024-01-09 ENCOUNTER — Encounter: Payer: Self-pay | Admitting: Physical Therapy

## 2024-01-09 DIAGNOSIS — R41841 Cognitive communication deficit: Secondary | ICD-10-CM

## 2024-01-09 DIAGNOSIS — M6281 Muscle weakness (generalized): Secondary | ICD-10-CM | POA: Diagnosis not present

## 2024-01-09 DIAGNOSIS — R278 Other lack of coordination: Secondary | ICD-10-CM

## 2024-01-09 DIAGNOSIS — R2689 Other abnormalities of gait and mobility: Secondary | ICD-10-CM

## 2024-01-09 DIAGNOSIS — R41840 Attention and concentration deficit: Secondary | ICD-10-CM

## 2024-01-09 DIAGNOSIS — R41844 Frontal lobe and executive function deficit: Secondary | ICD-10-CM | POA: Diagnosis not present

## 2024-01-09 DIAGNOSIS — R296 Repeated falls: Secondary | ICD-10-CM | POA: Diagnosis not present

## 2024-01-09 NOTE — Therapy (Signed)
 OUTPATIENT PHYSICAL THERAPY LOWER EXTREMITY TREATMENT   Patient Name: Danzell Birky MRN: [id number] DOB:12/12/1950, 73 y.o., male Today's Date: 01/09/2024  END OF SESSION:  PT End of Session - 01/09/24 1146     Visit Number 6    Date for Recertification  02/13/24    PT Start Time 1145    PT Stop Time 1230    PT Time Calculation (min) 45 min    Activity Tolerance Patient tolerated treatment well    Behavior During Therapy [facility] for tasks assessed/performed             Past Medical History:  Diagnosis Date   Anxiety    Atrial myxoma    Basal cell carcinoma    Cancer (HCC)    basal cell skin CA   Cataract    Depression    Diabetes mellitus without complication (HCC)    Diabetic neuropathy (HCC)    Duodenal adenoma    Hyperlipidemia    Hypertension    IC (interstitial cystitis)    Neuromuscular disorder (HCC) 01/09/21   Peripheral  Neuropathy   Pancreatic lesion    Squamous cell carcinoma of skin    Past Surgical History:  Procedure Laterality Date   BIOPSY  12/05/2022   Procedure: BIOPSY;  Surgeon: Wilhelmenia Aloha Raddle., MD;  Location: [facility];  Service: Gastroenterology;;   CATARACT EXTRACTION, BILATERAL     ESOPHAGOGASTRODUODENOSCOPY N/A 12/05/2022   Procedure: ESOPHAGOGASTRODUODENOSCOPY (EGD);  Surgeon: Wilhelmenia Aloha Raddle., MD;  Location: [facility];  Service: Gastroenterology;  Laterality: N/A;   EUS N/A 12/05/2022   Procedure: UPPER ENDOSCOPIC ULTRASOUND (EUS) RADIAL;  Surgeon: Wilhelmenia Aloha Raddle., MD;  Location: [facility];  Service: Gastroenterology;  Laterality: N/A;   FINE NEEDLE ASPIRATION N/A 12/05/2022   Procedure: FINE NEEDLE ASPIRATION (FNA) LINEAR;  Surgeon: Wilhelmenia Aloha Raddle., MD;  Location: [facility];  Service: Gastroenterology;  Laterality: N/A;   HERNIA REPAIR N/A    inguinal   NECK SURGERY     ant/post   SPINE SURGERY     SUBMUCOSAL TATTOO INJECTION  12/05/2022   Procedure: SUBMUCOSAL TATTOO INJECTION;  Surgeon:  Wilhelmenia Aloha Raddle., MD;  Location: [facility];  Service: Gastroenterology;;   TOTAL HIP ARTHROPLASTY Left 07/13/2023   Procedure: ARTHROPLASTY, HIP, TOTAL, ANTERIOR APPROACH;  Surgeon: Kendal Franky SQUIBB, MD;  Location: [facility];  Service: Orthopedics;  Laterality: Left;  [employer] bipolar Hana table  hemi hip   TRANSURETHRAL RESECTION OF PROSTATE     TURP VAPORIZATION     WHIPPLE PROCEDURE N/A 04/27/2023   Procedure: WHIPPLE PROCEDURE;  Surgeon: Aron Shoulders, MD;  Location: [facility];  Service: General;  Laterality: N/A;   Patient Active Problem List   Diagnosis Date Noted   Atrial myxoma 10/31/2023   History of left hip replacement 08/28/2023   Fracture of femoral neck, left (HCC) 07/13/2023   Protein-calorie malnutrition, severe 05/17/2023   Vomiting and diarrhea 05/13/2023   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   Malnutrition of moderate degree 05/08/2023   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   Poorly controlled type 2 diabetes mellitus with circulatory disorder (HCC) 02/08/2023   Vitamin D  deficiency 07/25/2022   Pain in both feet 02/10/2022   Peripheral neuropathy 01/25/2022   Seborrheic dermatitis 01/25/2022   Positive colorectal cancer screening using Cologuard test 03/16/2020   Chronic RLQ pain 03/16/2020   Type 2 diabetes mellitus with hyperglycemia, without long-term current use of insulin  (HCC) 06/02/2011   Hypertension 06/02/2011  Hyperlipemia 06/02/2011   Cataract 06/02/2011    PCP: Jenkins Earnie Carrel  REFERRING PROVIDER: Jenkins Earnie Carrel  REFERRING DIAG:  [phone] (ICD-10-CM) - History of left hip replacement  E44.0 (ICD-10-CM) - Malnutrition of moderate degree (HCC)  G60.9 (ICD-10-CM) - Idiopathic peripheral neuropathy  R53.1 (ICD-10-CM) - Generalized weakness    THERAPY DIAG:  Cognitive communication deficit  Other lack of coordination  Other abnormalities of gait and mobility  Muscle weakness  (generalized)  Rationale for Evaluation and Treatment: Rehabilitation  ONSET DATE: 07/12/23  SUBJECTIVE:   SUBJECTIVE STATEMENT:  Got his living situation figured out, will be going to assistant living facility   Per spouse: we have a two story home with 14 steps, its very critical that he gets up all of the steps safely. I'm very concerned about his safety on steps. Also having some safety concerns on stairs. Carmichael saw his PCP and she clearly stated that he needs to be using his RW as instructed, he agreed then when we got home he stopped using the walker. We have an option to improve his living situation with the ALF, his preference is to come home and have me manage everything by myself. We have a chance to improve mobility/strength/safety before he comes come, going back and forth.   PERTINENT HISTORY: 11/13/23--73 year old male with history of diabetes, neuropathy, hypertension, hyperlipidemia here for evaluation of memory, cognitive changes and generalized weakness.   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  He was admitted to the hospital for surgical treatment with Whipple procedure.  He was discharged home with couple weeks later.  He was readmitted on 05/13/2023 after increased diarrhea, weakness and falling down at home.  He was admitted for approximately 4 weeks with ongoing vomiting, diarrhea, protein calorie malnutrition, colitis and muscular deconditioning.  This time he was discharged to skilled nursing facility.   06/26/2023 patient fell down at nursing home, hit his head, and went to the emergency room.   07/12/2023 patient fell down again resulting in the left femoral neck fracture requiring admission and surgery. Overall patient has had significant difficulty returning to his baseline.  Wife notes that he is having some ongoing short-term memory difficulty, decreased insight, brain fog issues.  Also continues to be  generally deconditioned and weak.  They live in a two-story home with the bedroom and bathroom upstairs, 14 steps, and this remains a challenge for patient to transition from skilled nursing facility back home.   Pt is a 73 y.o. male admitted via EMS 07/12/23 for fall at rehab facility. X-ray showed L femoral neck fx. L THA performed 4/3. 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,  basal cell carcinoma, diabetes mellitus, diabetic neuropathy, neuromuscular disorder, hyperlipidemia, malignant neuroendocrine tumor of duodenum   PAIN:  Are you having pain? Yes: NPRS scale: little soreness/10 Pain location: L hip  Pain description: soreness  Aggravating factors: worse at end of day Relieving factors: meds, lying down, exercise   PRECAUTIONS: Fall  RED FLAGS: None   WEIGHT BEARING RESTRICTIONS: No  FALLS:  Has patient fallen in last 6 months? Yes. Number of falls no falls in the last 3 months, but at least 12 in the last 6 months   LIVING ENVIRONMENT: Lives with: lives with their spouse Lives in: House/apartment Stairs: Yes: Internal: 14 steps; on right going up and on left going up and External: 2 in garage and 2 to front door steps; can reach both and bilateral  but cannot reach both Has following equipment at home: Single point cane and Walker - 2 wheeled  **currently living at a Assisted Living for now**  OCCUPATION: An Tree surgeon   PLOF: Independent, Independent with basic ADLs, and Independent with gait  PATIENT GOALS: to get more independent, get back to my home  NEXT MD VISIT:   OBJECTIVE:  Note: Objective measures were completed at Evaluation unless otherwise noted.  DIAGNOSTIC FINDINGS: Left hip arthroplasty in expected alignment. No periprosthetic lucency or fracture. Recent postsurgical change includes air and edema in the soft tissues. Left hip arthroplasty without immediate postoperative complication.   COGNITION: Overall cognitive status: Within functional limits  for tasks assessed     SENSATION: Neuropathy in B feet    POSTURE: rounded shoulders and forward head   LOWER EXTREMITY ROM: WNL   LOWER EXTREMITY MMT: 4+/5 BLE    FUNCTIONAL TESTS:  5 times sit to stand: 19s  Timed up and go (TUG): 22s  GAIT: Distance walked: in clinic distances Assistive device utilized: Environmental consultant - 2 wheeled and None Level of assistance: Complete Independence and Modified independence Comments: able to complete TUG without walker, slow but is safe                                                                                                                        TREATMENT DATE: 01/09/24 NuStep L 5 x 6 min 20lb resisted gait 4 way x 3 Alt 6 in box taps x 5 each  From airex x10 each 6in step ups forward and lateral 2x10 LOB x5 mod to max assist to correct  HS curls 35lb 2x12 Leg Ext 10lb 2x12 Sit to stand LE on airex 2x10    01/03/24  Discussed pt's overall case and habits at home, set up of steps and home, usual exercise routine before surgery, also PT recommendation to go to ALF prior to home due to fall risk on steps/would be much safer to work on mobility/strength/balance prior to return home where he will need to be basically mod(I)  Gait outside with RW, curb and ramp navigation with min guard to MinA- had a hard time with surface changes and often used RW unsafely on these requiring min guard.  Full flight of steps x2 with U rail, MinA especially with descent due to intermittent posterior LOB, reciprocal pattern used.   Blue foam pad tandem stance 3x30 seconds B Min-modA  Tandem walking solid surface x4 laps in // bars     12/26/23 NuStep L5 x 7 min 6in forward & lateral step ups w/ one rail x10 each Stair training at pattern 2 rails 4 & 6 in S2S OHP yellow ball, cues to sit back more when descending 2x10  Standing march 3lb cuff 2x10  Posterior LOB Side step at mat table  Backwards walking  Alt 6 in box taps  HS curls 25lb 2x10 Leg  Ext 10lb 2x10   12/18/23 NuStep L5x35mins  Leg ext 5# 2x10 HS  curls 20# 2x10 Taps 4 CGA  Step ups 4 with rails Balance on airex, then with eyes closed  Calf raises 2x10 On airex reaching for numbers  Calf stretch on bar 15s x2 STS holding yellow ball 2x10  12/13/23 NuStep L5x68mins  LAQ 2# 2x10 HS curls green 2x10 Seated march and hip abd with band 2x10 Walking with cane 2 small laps around equipment STS 2x10   11/21/23 EVAL, POC    PATIENT EDUCATION:  Education details: POC, HEP, fall risk Person educated: Patient and Spouse Education method: Medical illustrator Education comprehension: verbalized understanding and returned demonstration  HOME EXERCISE PROGRAM: Access Code: [email or web address] URL: [email or web address] Date: 11/21/2023 Prepared by: Almetta Fam  Exercises - Sit to Stand  - 1 x daily - 7 x weekly - 2 sets - 10 reps - Seated Long Arc Quad  - 1 x daily - 7 x weekly - 2 sets - 10 reps - Standing Hip Abduction with Counter Support  - 1 x daily - 7 x weekly - 2 sets - 10 reps - Standing Hip Extension with Counter Support  - 1 x daily - 7 x weekly - 2 sets - 10 reps - Heel Raises with Counter Support  - 1 x daily - 7 x weekly - 2 sets - 10 reps  ASSESSMENT:  CLINICAL IMPRESSION:  Patient doing alright and had his spouse present today. He reports that's he will be going to an ALF. Heavy focus spent on balance interventions. Pt has the most difficult with step ups requiring max assist at times to regain balance. Instability present with resisted gait. He does become fatigue after prolong standing, sitting between interventions. No reported of pain. Cu for full ROM needed with leg ext and HS curls.     Patient is a 73 y.o. male who was seen today for physical therapy evaluation and treatment for weakness. He has an extensive medical history and multiple surgeries that has caused him to lose a lot of weight and muscle mass. His wife reports a number  of falls since the beginning of the year but none in the past 3 months. Currently he is staying at an assisted living to work on ADLs and increasing his independence. He walks with a walker outside of his home. His wife reports he has a cane, but needs practice on how to use it. Pt reports he does some walking around the house without it. At their home, he has stairs up to the bedroom and bathrooms so would like to practice working on this. He does have neuropathy and reports high levels of pain at times in both feet. Patient will benefit from PT to increase his strength and allow him to improve functional independence and mobility.   OBJECTIVE IMPAIRMENTS: Abnormal gait, decreased activity tolerance, decreased balance, decreased coordination, decreased endurance, difficulty walking, decreased strength, decreased safety awareness, and pain.   ACTIVITY LIMITATIONS: carrying, lifting, squatting, stairs, transfers, and locomotion level  PARTICIPATION LIMITATIONS: meal prep, cleaning, laundry, driving, shopping, community activity, and yard work  PERSONAL FACTORS: Fitness, Past/current experiences, Time since onset of injury/illness/exacerbation, and 3+ comorbidities: istory of diabetes, neuropathy, hypertension, hyperlipidemia here for evaluation of memory, cognitive changes and generalized weakness are also affecting patient's functional outcome.   REHAB POTENTIAL: Good  CLINICAL DECISION MAKING: Stable/uncomplicated  EVALUATION COMPLEXITY: Low  GOALS: Goals reviewed with patient? Yes  SHORT TERM GOALS: Target date: 01/02/24  Patient will be independent with initial HEP. Baseline:  Goal status: MET 12/13/23  2.  Patient will demonstrate improved functional LE strength as demonstrated by 5xSTS <15s. Baseline: 19s Goal status: INITIAL  3.  Patient will be educated on strategies to decrease risk of falls.  Baseline:  Goal status: IN PROGRESS 12/13/23   LONG TERM GOALS: Target date:  02/13/24  Patient will be independent with advanced/ongoing HEP to improve outcomes and carryover.  Baseline:  Goal status: INITIAL  2.  Patient will be able to ambulate 500' without AD with good safety to access community.  Baseline: short distances without AD Goal status: INITIAL  3.  Patient will be able to navigate a set of stairs with reciprocal pattern for safety inside his home.  Baseline:  Goal status: INITIAL   4.  Patient will demonstrate decreased fall risk by scoring < 14 sec on TUG. Baseline: 22s without walker Goal status: INITIAL  PLAN:  PT FREQUENCY: 2x/week  PT DURATION: 12 weeks  PLANNED INTERVENTIONS: 97110-Therapeutic exercises, 97530- Therapeutic activity, 97112- Neuromuscular re-education, 97535- Self Care, [id number]- Manual therapy, [phone]- Gait training, [phone]- Electrical stimulation (unattended), 97016- Vasopneumatic device, 20560 (1-2 muscles), 20561 (3+ muscles)- Dry Needling, Patient/Family education, Balance training, Stair training, Joint mobilization, Joint manipulation, Spinal manipulation, Spinal mobilization, Cryotherapy, and Moist heat  PLAN FOR NEXT SESSION: LE strengthening, gait training with cane, functional tasks, increase balance work and encourage ALF before home   Tanda Sorrow, PTA 01/09/24 11:46 AM

## 2024-01-09 NOTE — Therapy (Signed)
 OUTPATIENT OCCUPATIONAL THERAPY NEURO treatment  Patient Name: Andrew Tanner MRN: [id number] DOB:[birth year], 73 y.o., male Today's Date: 01/09/2024  PCP: Dr. Adaline  REFERRING PROVIDER: Dr. Adaline  END OF SESSION:  OT End of Session - 01/09/24 1024     Visit Number 7    Number of Visits 25    Date for Recertification  02/07/24    Authorization Type [employer] Medicare    Authorization - Visit Number 7    OT Start Time 1020    OT Stop Time 1058    OT Time Calculation (min) 38 min    Activity Tolerance Patient tolerated treatment well    Behavior During Therapy WFL for tasks assessed/performed            Past Medical History:  Diagnosis Date   Anxiety    Atrial myxoma    Basal cell carcinoma    Cancer (HCC)    basal cell skin CA   Cataract    Depression    Diabetes mellitus without complication (HCC)    Diabetic neuropathy (HCC)    Duodenal adenoma    Hyperlipidemia    Hypertension    IC (interstitial cystitis)    Neuromuscular disorder (HCC) 01/09/21   Peripheral  Neuropathy   Pancreatic lesion    Squamous cell carcinoma of skin    Past Surgical History:  Procedure Laterality Date   BIOPSY  12/05/2022   Procedure: BIOPSY;  Surgeon: Wilhelmenia Aloha Raddle., MD;  Location: [facility];  Service: Gastroenterology;;   CATARACT EXTRACTION, BILATERAL     ESOPHAGOGASTRODUODENOSCOPY N/A 12/05/2022   Procedure: ESOPHAGOGASTRODUODENOSCOPY (EGD);  Surgeon: Wilhelmenia Aloha Raddle., MD;  Location: [facility];  Service: Gastroenterology;  Laterality: N/A;   EUS N/A 12/05/2022   Procedure: UPPER ENDOSCOPIC ULTRASOUND (EUS) RADIAL;  Surgeon: Wilhelmenia Aloha Raddle., MD;  Location: [facility];  Service: Gastroenterology;  Laterality: N/A;   FINE NEEDLE ASPIRATION N/A 12/05/2022   Procedure: FINE NEEDLE ASPIRATION (FNA) LINEAR;  Surgeon: Wilhelmenia Aloha Raddle., MD;  Location: [facility];  Service: Gastroenterology;  Laterality: N/A;   HERNIA REPAIR N/A    inguinal   NECK  SURGERY     ant/post   SPINE SURGERY     SUBMUCOSAL TATTOO INJECTION  12/05/2022   Procedure: SUBMUCOSAL TATTOO INJECTION;  Surgeon: Wilhelmenia Aloha Raddle., MD;  Location: [facility];  Service: Gastroenterology;;   TOTAL HIP ARTHROPLASTY Left 07/13/2023   Procedure: ARTHROPLASTY, HIP, TOTAL, ANTERIOR APPROACH;  Surgeon: Kendal Franky SQUIBB, MD;  Location: [facility];  Service: Orthopedics;  Laterality: Left;  [employer] bipolar Hana table  hemi hip   TRANSURETHRAL RESECTION OF PROSTATE     TURP VAPORIZATION     WHIPPLE PROCEDURE N/A 04/27/2023   Procedure: WHIPPLE PROCEDURE;  Surgeon: Aron Shoulders, MD;  Location: [facility];  Service: General;  Laterality: N/A;   Patient Active Problem List   Diagnosis Date Noted   Atrial myxoma 10/31/2023   History of left hip replacement 08/28/2023   Fracture of femoral neck, left (HCC) 07/13/2023   Protein-calorie malnutrition, severe 05/17/2023   Vomiting and diarrhea 05/13/2023   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   Malnutrition of moderate degree 05/08/2023   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   Poorly controlled type 2 diabetes mellitus with circulatory disorder (HCC) 02/08/2023   Vitamin D  deficiency 07/25/2022   Pain in both feet 02/10/2022   Peripheral neuropathy 01/25/2022   Seborrheic dermatitis 01/25/2022   Positive colorectal cancer screening using Cologuard test  03/16/2020   Chronic RLQ pain 03/16/2020   Type 2 diabetes mellitus with hyperglycemia, without long-term current use of insulin  (HCC) 06/02/2011   Hypertension 06/02/2011   Hyperlipemia 06/02/2011   Cataract 06/02/2011    ONSET DATE: referral 10/02/23  REFERRING DIAG: Generalized weakness, Idiopathy peripheral neuropathy  L hip replacement   THERAPY DIAG:  Other lack of coordination  Other abnormalities of gait and mobility  Muscle weakness (generalized)  Frontal lobe and executive function  deficit  Attention and concentration deficit  Rationale for Evaluation and Treatment: Rehabilitation  SUBJECTIVE:   SUBJECTIVE STATEMENT: Pt reports he has done several loads of wash Pt accompanied by: significant other  PERTINENT HISTORY:Pt is a 73 y.o. male admitted via EMS 07/12/23 for fall at rehab facility. X-ray showed L femoral neck fx. L THA performed 4/3. 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 Yes: NPRS scale: 3/10 Pain location: LLE Pain description: aching Aggravating factors: malpositioning Relieving factors: repositioning  FALLS: Has patient fallen in last 6 months? Yes. Number of falls multiple  LIVING ENVIRONMENT: Lives with: lives with their family and lives in an adult home, pt is staying in a home with personal care assistance since he can't climb stairs at home Lives in: House/apartment Stairs: No Has following equipment at home: Walker - 4 wheeled and shower chair  PLOF: Independent  PATIENT GOALS: improve independence, move back home  OBJECTIVE:  Note: Objective measures were completed at Evaluation unless otherwise noted.  HAND DOMINANCE: Right  ADLs:has assist for starting shower, pt performs  with supervision Overall ADLs: increased time required Transfers/ambulation related to ADLs: Eating: mod I  UB Dressing: supervision LB Dressing: supervision Toileting: mod I with walker Bathing: supervision Tub Shower transfers: has tub seat, min A Equipment: Shower seat with back  IADLs:Dependent for IADLs  Handwriting: 100% legible  MOBILITY STATUS: supervision     ACTIVITY TOLERANCE: Activity tolerance: decreased overall endurance   UPPER  EXTREMITY ROM:  A/ROM WFLs    UPPER EXTREMITY MMT:     MMT Right eval Left eval  Shoulder flexion 4-/5 4-/5  Shoulder abduction    Shoulder adduction    Shoulder extension    Shoulder internal rotation    Shoulder external rotation    Middle trapezius    Lower trapezius    Elbow flexion 4/5 4/5  Elbow extension 4/5 4/5  Wrist flexion    Wrist extension    Wrist ulnar deviation    Wrist radial deviation    Wrist pronation    Wrist supination    (Blank rows = not tested)  HAND FUNCTION: Grip strength: Right: 75 lbs; Left: 70 lbs  COORDINATION: 9 Hole Peg test: Right: 36.84 sec; Left: 35.46 sec, tremors and increased difficulty  SENSATION: Not tested    COGNITION: Overall cognitive status: decreased short term memory, correctly completes trail making A, unable to correctly complete trail making B- decreased alternating attention    VISION ASSESSMENT:NT, wears glasses Pt  was noted to bump into door frame x 2 with walker, vision to be further assessed within a functional context.     OBSERVATIONS:  11/15/23-Plesant gentleman with complex medical history accompanied by his wife. Pt is and artist.Pt is living in an adult care home since he can't navigate the  stairs at home.                                                                                                                             TREATMENT DATE:01/09/24 Pt/ wife have decided to transition pt to ALF in October discussed the transition. Pt may benefit from walker tray or use of rollator  to carry items at new location. Pt wrote out several safety recommendations for transition.  Ambulating while locating items in a busy environment with focus on safety and organized scanning 12/15 items located first pass, min v.c to locate remainder. Therapist recommends pt looks for items in a store for home work  UBE x 6 mins level 3 for conditioning   01/03/24- Discussion with pt/ wife regarding pt's potential  transition to ALF. Pt is adamantly opposed and wants to go home. Pt needs to be more I and able to climb a flight of stairs mod I. Pt was encouraged to onsider the ALF as he is currently living in a group home. He would have better socialization, meals for DM and access to therapy on site. Reviewed memory strategies that pt is using. Funational dynamic activities in standing: weightbearing through bilateral UE's rocking forwards and backwards then countetop pushups for management of tremors. copying small peg design on vertical surface with left and right UE's mod-max diffity due to tremors. Pt stood for 10 mins total prior to rest, and he transitioned to working on Estate agent on countertop and table  with min-mod difficulty and v.c for compensation of tremors.   12/26/23 UBE x 6 mins level 3 for conditioning  Pt reports he has washed 3 loads of clothing since last visit and has made his bed initally with supervision. Pt generated a list of steps involved in making an egg sandwich. Therapist provided recommendations. Pt initally only wrote out the steps involved in making an egg, min-mod v.c to add additional details and complete task. Therapist recommended pt gathers all items prior to starting task. Therpist discussed safety with task and need for supervision initally. Discussed strategies for thoroughness with bathing and using a system with pt/ wife. Pt's wife to discuss with caregiver. Reveiwed red theraband HEP, mod v.c 10-15 reps each.   12/20/23- Discussed activities pt can begin performing to maximize I with ADLs/IADLs. (Pt has been very focused on his artwork and is doing well with the artwork). Pt to start performing laundry at his group home and possibly simple cooking with supervision. Pt simulated carrying pillowcase full of dirty laundry to and from target and unloading with supervision. Pt wrote out steps involved in perfroming laundry to organize task. Pt wrote out steps however it was  initally for what he would do in his home.Pt is currently living in a group home and situation would be different. Pt then wrote out steps he would need to do to perfrom task at group home. Pt omitted several steps and required mod v.c to add  aditional steps. Pt to write out organized list for home work.  Standing at countertop to perfrom dynamic functional reaching with left and right UE's with close supervision and min v.c  12/13/23- memory/ cognitve tips education, see pt instructions Pt/ wife were instructed in red theraband HEP, min-mod v.c and demonstration, 10-15 reps each bilateral UE's  12/06/23- Pt/ wife were instructed in HEP for coordination, see pt instructions. Pt practiced printing using foam grip and foam grip was issued for handwriting. Pt demonstrates improved printing with v.c to slow down and form letters deliberately. Pt was cued to rest arms on table for improving tremors. Pt was instructed in flicks. 11/15/23- eval         PATIENT EDUCATION: Education details:  see above Person educated: Patient and Spouse Education method: Explanation, demonstration, v.c,  Education comprehension: verbalized understanding, returned demonstration,  HOME EXERCISE PROGRAM: coordiantion, writing 12/13/23- red t band, memory/ cognitve strategies   GOALS: Goals reviewed with patient? Yes  SHORT TERM GOALS: Target date: 12/16/23  I with inital HEP.  Goal status: ongoing  01/03/24 needs reinforcement  2.  Pt will verbalize understanding of memory compensations.  Goal status: met., 01/03/24  3.  Pt will perform an alternating attention task with 80% accuracy for increased ease with IADLs.  Goal status:  ongoing 01/03/24  4.  Pt will perfrom dynamic activities in standing x 10 mins without LOB for increaed ease with ADLs/IADLs.  Goal status:  met, Pt stood for 10 mins today  5.  Pt will perform an organization/ scheduling task with 90% or better accuracy (plan meunu/ grocery list,  organizing your day task)  Goal status:  ongoing 12/26/23- min-mod v.c for organization task today    LONG TERM GOALS: Target date: 02/07/24  I with updated HEP. Baseline:  Goal status: ongoing 01/09/24  2.  Pt will perform simple snack or sandwich prep mod I.  Goal status:ongoing, 01/09/24  3.  Pt will perform simple IADL tasks such as folding laundry and washing dishes swith supervision.  Goal status: Pt is perfroming laundry with distant supervision. 01/09/24  4.  Pt will navigate a busy environment without bumping into items and will locate items with 90% or better accuracy. Baseline:  Goal status: ongoing 12/15 located 9/302/25  5.  Pt will demonstrate improved alternating attention as evidenced by completing trailmaking B correctly in 3 mins or less.  Goal status: ongoing, 9/302/25    ASSESSMENT:  CLINICAL IMPRESSION: Pt is progressing towards goals.Pt is progressing towards goals. He can benefit from continued scanning and navigation in a busy environment.PERFORMANCE DEFICITS: in functional skills including ADLs, IADLs, coordination, dexterity, strength, pain, flexibility, Fine motor control, Gross motor control, mobility, balance, endurance, decreased knowledge of precautions, decreased knowledge of use of DME, vision, and UE functional use, cognitive skills including attention, memory, problem solving, safety awareness, sequencing, and temperament/personality, and psychosocial skills including coping strategies, environmental adaptation, habits, interpersonal interactions, and routines and behaviors.   IMPAIRMENTS: are limiting patient from ADLs, IADLs, work, play, leisure, and social participation.   CO-MORBIDITIES: may have co-morbidities  that affects occupational performance. Patient will benefit from skilled OT to address above impairments and improve overall function.  MODIFICATION OR ASSISTANCE TO COMPLETE EVALUATION: No modification of tasks or assist necessary to  complete an evaluation.  OT OCCUPATIONAL PROFILE AND HISTORY: Detailed assessment: Review of records and additional review of physical, cognitive, psychosocial history related to current functional performance.  CLINICAL DECISION MAKING: LOW - limited treatment options, no task  modification necessary  REHAB POTENTIAL: Good  EVALUATION COMPLEXITY: Low    PLAN:  OT FREQUENCY: 1-2x/week  OT DURATION: 12 weeks  PLANNED INTERVENTIONS: 97168 OT Re-evaluation, 97535 self care/ADL training, 02889 therapeutic exercise, 97530 therapeutic activity, 97112 neuromuscular re-education, 97140 manual therapy, 97035 ultrasound, 97018 paraffin, 02989 moist heat, 97010 cryotherapy, 97129 Cognitive training (first 15 min), 02869 Cognitive training(each additional 15 min), balance training, functional mobility training, visual/perceptual remediation/compensation, psychosocial skills training, energy conservation, coping strategies training, patient/family education, and DME and/or AE instructions  RECOMMENDED OTHER SERVICES: PT, ST  CONSULTED AND AGREED WITH PLAN OF CARE: Patient and family member/caregiver  PLAN FOR NEXT SESSION:   review HEP, organization/ alternating attention   Aziah Kaiser, OT 01/09/2024, 10:29 AM

## 2024-01-10 ENCOUNTER — Ambulatory Visit: Admitting: Speech Pathology

## 2024-01-10 ENCOUNTER — Encounter: Payer: Self-pay | Admitting: Physical Therapy

## 2024-01-10 ENCOUNTER — Ambulatory Visit: Admitting: Occupational Therapy

## 2024-01-10 ENCOUNTER — Ambulatory Visit: Attending: Family Medicine | Admitting: Physical Therapy

## 2024-01-10 DIAGNOSIS — R41841 Cognitive communication deficit: Secondary | ICD-10-CM | POA: Diagnosis not present

## 2024-01-10 DIAGNOSIS — R41844 Frontal lobe and executive function deficit: Secondary | ICD-10-CM | POA: Insufficient documentation

## 2024-01-10 DIAGNOSIS — R2689 Other abnormalities of gait and mobility: Secondary | ICD-10-CM | POA: Diagnosis not present

## 2024-01-10 DIAGNOSIS — R296 Repeated falls: Secondary | ICD-10-CM | POA: Insufficient documentation

## 2024-01-10 DIAGNOSIS — M6281 Muscle weakness (generalized): Secondary | ICD-10-CM | POA: Diagnosis not present

## 2024-01-10 DIAGNOSIS — R278 Other lack of coordination: Secondary | ICD-10-CM | POA: Insufficient documentation

## 2024-01-10 DIAGNOSIS — R41840 Attention and concentration deficit: Secondary | ICD-10-CM | POA: Insufficient documentation

## 2024-01-10 NOTE — Therapy (Signed)
 OUTPATIENT PHYSICAL THERAPY LOWER EXTREMITY TREATMENT   Patient Name: Andrew Tanner MRN: [id number] DOB:1950-06-19, 73 y.o., male Today's Date: 01/10/2024  END OF SESSION:  PT End of Session - 01/10/24 0942     Visit Number 7    Date for Recertification  02/13/24    Authorization Type [employer] Medicare    PT Start Time 0932    PT Stop Time 1012    PT Time Calculation (min) 40 min    Equipment Utilized During Treatment Gait belt    Activity Tolerance Patient tolerated treatment well    Behavior During Therapy WFL for tasks assessed/performed              Past Medical History:  Diagnosis Date   Anxiety    Atrial myxoma    Basal cell carcinoma    Cancer (HCC)    basal cell skin CA   Cataract    Depression    Diabetes mellitus without complication (HCC)    Diabetic neuropathy (HCC)    Duodenal adenoma    Hyperlipidemia    Hypertension    IC (interstitial cystitis)    Neuromuscular disorder (HCC) 01/09/21   Peripheral  Neuropathy   Pancreatic lesion    Squamous cell carcinoma of skin    Past Surgical History:  Procedure Laterality Date   BIOPSY  12/05/2022   Procedure: BIOPSY;  Surgeon: Wilhelmenia Aloha Raddle., MD;  Location: [facility];  Service: Gastroenterology;;   CATARACT EXTRACTION, BILATERAL     ESOPHAGOGASTRODUODENOSCOPY N/A 12/05/2022   Procedure: ESOPHAGOGASTRODUODENOSCOPY (EGD);  Surgeon: Wilhelmenia Aloha Raddle., MD;  Location: [facility];  Service: Gastroenterology;  Laterality: N/A;   EUS N/A 12/05/2022   Procedure: UPPER ENDOSCOPIC ULTRASOUND (EUS) RADIAL;  Surgeon: Wilhelmenia Aloha Raddle., MD;  Location: [facility];  Service: Gastroenterology;  Laterality: N/A;   FINE NEEDLE ASPIRATION N/A 12/05/2022   Procedure: FINE NEEDLE ASPIRATION (FNA) LINEAR;  Surgeon: Wilhelmenia Aloha Raddle., MD;  Location: [facility];  Service: Gastroenterology;  Laterality: N/A;   HERNIA REPAIR N/A    inguinal   NECK SURGERY     ant/post   SPINE SURGERY      SUBMUCOSAL TATTOO INJECTION  12/05/2022   Procedure: SUBMUCOSAL TATTOO INJECTION;  Surgeon: Wilhelmenia Aloha Raddle., MD;  Location: [facility];  Service: Gastroenterology;;   TOTAL HIP ARTHROPLASTY Left 07/13/2023   Procedure: ARTHROPLASTY, HIP, TOTAL, ANTERIOR APPROACH;  Surgeon: Kendal Franky SQUIBB, MD;  Location: [facility];  Service: Orthopedics;  Laterality: Left;  [employer] bipolar Hana table  hemi hip   TRANSURETHRAL RESECTION OF PROSTATE     TURP VAPORIZATION     WHIPPLE PROCEDURE N/A 04/27/2023   Procedure: WHIPPLE PROCEDURE;  Surgeon: Aron Shoulders, MD;  Location: [facility];  Service: General;  Laterality: N/A;   Patient Active Problem List   Diagnosis Date Noted   Atrial myxoma 10/31/2023   History of left hip replacement 08/28/2023   Fracture of femoral neck, left (HCC) 07/13/2023   Protein-calorie malnutrition, severe 05/17/2023   Vomiting and diarrhea 05/13/2023   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   Malnutrition of moderate degree 05/08/2023   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   Poorly controlled type 2 diabetes mellitus with circulatory disorder (HCC) 02/08/2023   Vitamin D  deficiency 07/25/2022   Pain in both feet 02/10/2022   Peripheral neuropathy 01/25/2022   Seborrheic dermatitis 01/25/2022   Positive colorectal cancer screening using Cologuard test 03/16/2020   Chronic RLQ pain 03/16/2020   Type 2  diabetes mellitus with hyperglycemia, without long-term current use of insulin  (HCC) 06/02/2011   Hypertension 06/02/2011   Hyperlipemia 06/02/2011   Cataract 06/02/2011    PCP: Jenkins Earnie Carrel  REFERRING PROVIDER: Jenkins Earnie Carrel  REFERRING DIAG:  [phone] (ICD-10-CM) - History of left hip replacement  E44.0 (ICD-10-CM) - Malnutrition of moderate degree (HCC)  G60.9 (ICD-10-CM) - Idiopathic peripheral neuropathy  R53.1 (ICD-10-CM) - Generalized weakness    THERAPY DIAG:  Other lack of coordination  Other  abnormalities of gait and mobility  Muscle weakness (generalized)  Repeated falls  Rationale for Evaluation and Treatment: Rehabilitation  ONSET DATE: 07/12/23  SUBJECTIVE:   SUBJECTIVE STATEMENT:  Going to ALF, would like to try my 4WW today with you too and see if its appropriate. Sore after last visit   PERTINENT HISTORY: 11/13/23--73 year old male with history of diabetes, neuropathy, hypertension, hyperlipidemia here for evaluation of memory, cognitive changes and generalized weakness.   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  He was admitted to the hospital for surgical treatment with Whipple procedure.  He was discharged home with couple weeks later.  He was readmitted on 05/13/2023 after increased diarrhea, weakness and falling down at home.  He was admitted for approximately 4 weeks with ongoing vomiting, diarrhea, protein calorie malnutrition, colitis and muscular deconditioning.  This time he was discharged to skilled nursing facility.   06/26/2023 patient fell down at nursing home, hit his head, and went to the emergency room.   07/12/2023 patient fell down again resulting in the left femoral neck fracture requiring admission and surgery. Overall patient has had significant difficulty returning to his baseline.  Wife notes that he is having some ongoing short-term memory difficulty, decreased insight, brain fog issues.  Also continues to be generally deconditioned and weak.  They live in a two-story home with the bedroom and bathroom upstairs, 14 steps, and this remains a challenge for patient to transition from skilled nursing facility back home.   Pt is a 73 y.o. male admitted via EMS 07/12/23 for fall at rehab facility. X-ray showed L femoral neck fx. L THA performed 4/3. 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,  basal cell carcinoma, diabetes mellitus, diabetic neuropathy, neuromuscular disorder, hyperlipidemia,  malignant neuroendocrine tumor of duodenum   PAIN:  Are you having pain? Yes: NPRS scale: little soreness/10 Pain location: general  Pain description: mm soreness after last PT session Aggravating factors: worse at end of day Relieving factors: meds, lying down, exercise   PRECAUTIONS: Fall  RED FLAGS: None   WEIGHT BEARING RESTRICTIONS: No  FALLS:  Has patient fallen in last 6 months? Yes. Number of falls no falls in the last 3 months, but at least 12 in the last 6 months   LIVING ENVIRONMENT: Lives with: lives with their spouse Lives in: House/apartment Stairs: Yes: Internal: 14 steps; on right going up and on left going up and External: 2 in garage and 2 to front door steps; can reach both and bilateral but cannot reach both Has following equipment at home: Single point cane and Walker - 2 wheeled  **currently living at a Assisted Living for now**  OCCUPATION: An Tree surgeon   PLOF: Independent, Independent with basic ADLs, and Independent with gait  PATIENT GOALS: to get more independent, get back to my home  NEXT MD VISIT:   OBJECTIVE:  Note: Objective measures were completed at Evaluation unless otherwise noted.  DIAGNOSTIC FINDINGS: Left hip arthroplasty in expected alignment.  No periprosthetic lucency or fracture. Recent postsurgical change includes air and edema in the soft tissues. Left hip arthroplasty without immediate postoperative complication.   COGNITION: Overall cognitive status: Within functional limits for tasks assessed     SENSATION: Neuropathy in B feet    POSTURE: rounded shoulders and forward head   LOWER EXTREMITY ROM: WNL   LOWER EXTREMITY MMT: 4+/5 BLE    FUNCTIONAL TESTS:  5 times sit to stand: 19s  Timed up and go (TUG): 22s  GAIT: Distance walked: in clinic distances Assistive device utilized: Environmental consultant - 2 wheeled and None Level of assistance: Complete Independence and Modified independence Comments: able to complete TUG  without walker, slow but is safe                                                                                                                        TREATMENT DATE:  01/10/24  Nustep L5x8 minutes all four extremities seat 13   Gait inside and outside with his 4WW- much safer with 4WW than RW, needed min cues for curb navigation and speed going downhill but only because this was a new device to him. Basic education on 4WW lock management with transfers   Tandem stance solid surface 3x30 seconds close min guard  Side steps on blue foam pad in // bars x4 laps light minA Forward step ups 4 inch box on blue foam x10 B min guard  Standing marches on blue foam pad x20 alternating up to ModA due to anterior lean with this task     01/09/24 NuStep L 5 x 6 min 20lb resisted gait 4 way x 3 Alt 6 in box taps x 5 each  From airex x10 each 6in step ups forward and lateral 2x10 LOB x5 mod to max assist to correct  HS curls 35lb 2x12 Leg Ext 10lb 2x12 Sit to stand LE on airex 2x10     PATIENT EDUCATION:  Education details: POC, HEP, fall risk Person educated: Patient and Spouse Education method: Medical illustrator Education comprehension: verbalized understanding and returned demonstration  HOME EXERCISE PROGRAM: Access Code: [email or web address] URL: [email or web address] Date: 11/21/2023 Prepared by: Almetta Fam  Exercises - Sit to Stand  - 1 x daily - 7 x weekly - 2 sets - 10 reps - Seated Long Arc Quad  - 1 x daily - 7 x weekly - 2 sets - 10 reps - Standing Hip Abduction with Counter Support  - 1 x daily - 7 x weekly - 2 sets - 10 reps - Standing Hip Extension with Counter Support  - 1 x daily - 7 x weekly - 2 sets - 10 reps - Heel Raises with Counter Support  - 1 x daily - 7 x weekly - 2 sets - 10 reps  ASSESSMENT:  CLINICAL IMPRESSION:  Focused primarily on functional balance today as this has been very challenging for him in combination with inherent high  fall risk from poor safety awareness. He brought  his 4WW with him today, we tried this indoors and outdoors and I think this is a good match for him as he tends to try to pick up this device less than he does with RW. Will continue to work with him up to transfer to ALF (still not sure if he will be getting  in house PT or if he will continue to come here for OP PT).    EVAL: Patient is a 73 y.o. male who was seen today for physical therapy evaluation and treatment for weakness. He has an extensive medical history and multiple surgeries that has caused him to lose a lot of weight and muscle mass. His wife reports a number of falls since the beginning of the year but none in the past 3 months. Currently he is staying at an assisted living to work on ADLs and increasing his independence. He walks with a walker outside of his home. His wife reports he has a cane, but needs practice on how to use it. Pt reports he does some walking around the house without it. At their home, he has stairs up to the bedroom and bathrooms so would like to practice working on this. He does have neuropathy and reports high levels of pain at times in both feet. Patient will benefit from PT to increase his strength and allow him to improve functional independence and mobility.   OBJECTIVE IMPAIRMENTS: Abnormal gait, decreased activity tolerance, decreased balance, decreased coordination, decreased endurance, difficulty walking, decreased strength, decreased safety awareness, and pain.   ACTIVITY LIMITATIONS: carrying, lifting, squatting, stairs, transfers, and locomotion level  PARTICIPATION LIMITATIONS: meal prep, cleaning, laundry, driving, shopping, community activity, and yard work  PERSONAL FACTORS: Fitness, Past/current experiences, Time since onset of injury/illness/exacerbation, and 3+ comorbidities: istory of diabetes, neuropathy, hypertension, hyperlipidemia here for evaluation of memory, cognitive changes and generalized  weakness are also affecting patient's functional outcome.   REHAB POTENTIAL: Good  CLINICAL DECISION MAKING: Stable/uncomplicated  EVALUATION COMPLEXITY: Low  GOALS: Goals reviewed with patient? Yes  SHORT TERM GOALS: Target date: 01/02/24  Patient will be independent with initial HEP. Baseline:  Goal status: MET 12/13/23  2.  Patient will demonstrate improved functional LE strength as demonstrated by 5xSTS <15s. Baseline: 19s Goal status: INITIAL  3.  Patient will be educated on strategies to decrease risk of falls.  Baseline:  Goal status: IN PROGRESS 12/13/23   LONG TERM GOALS: Target date: 02/13/24  Patient will be independent with advanced/ongoing HEP to improve outcomes and carryover.  Baseline:  Goal status: INITIAL  2.  Patient will be able to ambulate 500' without AD with good safety to access community.  Baseline: short distances without AD Goal status: INITIAL  3.  Patient will be able to navigate a set of stairs with reciprocal pattern for safety inside his home.  Baseline:  Goal status: INITIAL   4.  Patient will demonstrate decreased fall risk by scoring < 14 sec on TUG. Baseline: 22s without walker Goal status: INITIAL  PLAN:  PT FREQUENCY: 2x/week  PT DURATION: 12 weeks  PLANNED INTERVENTIONS: 97110-Therapeutic exercises, 97530- Therapeutic activity, [id number]- Neuromuscular re-education, 97535- Self Care, [id number]- Manual therapy, [phone]- Gait training, [phone]- Electrical stimulation (unattended), 97016- Vasopneumatic device, 20560 (1-2 muscles), 20561 (3+ muscles)- Dry Needling, Patient/Family education, Balance training, Stair training, Joint mobilization, Joint manipulation, Spinal manipulation, Spinal mobilization, Cryotherapy, and Moist heat  PLAN FOR NEXT SESSION: LE strengthening, gait training with cane, functional tasks, continue working on balance. Potential transfer to ALF  mid-October, not sure if he will get in house PT there or continue to come here?  Continue working on sequencing with [id number], watch L brake as it is loose   Josette Rough, PT, DPT 01/10/24 10:13 AM

## 2024-01-10 NOTE — Therapy (Signed)
 OUTPATIENT SPEECH LANGUAGE PATHOLOGY TREATMENT   Patient Name: Andrew Tanner MRN: [id number] DOB:1950-11-02, 73 y.o., male Today's Date: 01/10/2024  PCP: Wendolyn Jenkins Jansky, MD  REFERRING PROVIDER: Wendolyn Jenkins Jansky, MD  END OF SESSION:  End of Session - 01/09/24 1101     Visit Number 7    Number of Visits 12    Date for Recertification  01/21/24    SLP Start Time 1100    SLP Stop Time  1140    SLP Time Calculation (min) 40 min    Activity Tolerance Patient tolerated treatment well          Past Medical History:  Diagnosis Date   Anxiety    Atrial myxoma    Basal cell carcinoma    Cancer (HCC)    basal cell skin CA   Cataract    Depression    Diabetes mellitus without complication (HCC)    Diabetic neuropathy (HCC)    Duodenal adenoma    Hyperlipidemia    Hypertension    IC (interstitial cystitis)    Neuromuscular disorder (HCC) 01/09/21   Peripheral  Neuropathy   Pancreatic lesion    Squamous cell carcinoma of skin    Past Surgical History:  Procedure Laterality Date   BIOPSY  12/05/2022   Procedure: BIOPSY;  Surgeon: Wilhelmenia Aloha Raddle., MD;  Location: [facility];  Service: Gastroenterology;;   CATARACT EXTRACTION, BILATERAL     ESOPHAGOGASTRODUODENOSCOPY N/A 12/05/2022   Procedure: ESOPHAGOGASTRODUODENOSCOPY (EGD);  Surgeon: Wilhelmenia Aloha Raddle., MD;  Location: [facility];  Service: Gastroenterology;  Laterality: N/A;   EUS N/A 12/05/2022   Procedure: UPPER ENDOSCOPIC ULTRASOUND (EUS) RADIAL;  Surgeon: Wilhelmenia Aloha Raddle., MD;  Location: [facility];  Service: Gastroenterology;  Laterality: N/A;   FINE NEEDLE ASPIRATION N/A 12/05/2022   Procedure: FINE NEEDLE ASPIRATION (FNA) LINEAR;  Surgeon: Wilhelmenia Aloha Raddle., MD;  Location: [facility];  Service: Gastroenterology;  Laterality: N/A;   HERNIA REPAIR N/A    inguinal   NECK SURGERY     ant/post   SPINE SURGERY     SUBMUCOSAL TATTOO INJECTION  12/05/2022   Procedure: SUBMUCOSAL TATTOO  INJECTION;  Surgeon: Wilhelmenia Aloha Raddle., MD;  Location: [facility];  Service: Gastroenterology;;   TOTAL HIP ARTHROPLASTY Left 07/13/2023   Procedure: ARTHROPLASTY, HIP, TOTAL, ANTERIOR APPROACH;  Surgeon: Kendal Franky SQUIBB, MD;  Location: [facility];  Service: Orthopedics;  Laterality: Left;  [employer] bipolar Hana table  hemi hip   TRANSURETHRAL RESECTION OF PROSTATE     TURP VAPORIZATION     WHIPPLE PROCEDURE N/A 04/27/2023   Procedure: WHIPPLE PROCEDURE;  Surgeon: Aron Shoulders, MD;  Location: [facility] OR;  Service: General;  Laterality: N/A;   Patient Active Problem List   Diagnosis Date Noted   Atrial myxoma 10/31/2023   History of left hip replacement 08/28/2023   Fracture of femoral neck, left (HCC) 07/13/2023   Protein-calorie malnutrition, severe 05/17/2023   Vomiting and diarrhea 05/13/2023   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   Malnutrition of moderate degree 05/08/2023   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   Poorly controlled type 2 diabetes mellitus with circulatory disorder (HCC) 02/08/2023   Vitamin D  deficiency 07/25/2022   Pain in both feet 02/10/2022   Peripheral neuropathy 01/25/2022   Seborrheic dermatitis 01/25/2022   Positive colorectal cancer screening using Cologuard test 03/16/2020   Chronic RLQ pain 03/16/2020   Type 2 diabetes mellitus with hyperglycemia, without long-term current use of insulin  (HCC)  06/02/2011   Hypertension 06/02/2011   Hyperlipemia 06/02/2011   Cataract 06/02/2011    ONSET DATE: Referred on  11/02/23  REFERRING DIAG: None provided; reportedly here for brain fog 2/2 anesthesia post surgeries  THERAPY DIAG:  Cognitive communication deficit  Rationale for Evaluation and Treatment: Rehabilitation  SUBJECTIVE:   SUBJECTIVE STATEMENT:  Pt will be heading to Assisted Living   Pt accompanied by: self and significant other; Andrew Tanner  PERTINENT HISTORY: Per chart review:  73 year old male with history of diabetes, neuropathy, hypertension, hyperlipidemia here for evaluation of memory, cognitive changes and generalized weakness.   PAIN:  Are you having pain? Yes; 1/10 (soreness); neuropathy   FALLS: Has patient fallen in last 6 months?  Yes, Number of falls: 4   LIVING ENVIRONMENT: Lives with: lives in an adult home; [facility] Lives in: Group home; hoping to return home   *under Long term care   PLOF:  Level of assistance: Needed assistance with ADLs, Needed assistance with IADLS; two surgeries (whipple 04/2023 and hip 07/2023)  Employment: Retired; full Control and instrumentation engineer   PATIENT GOALS: to return back home  OBJECTIVE:  Note: Objective measures were completed at Evaluation unless otherwise noted.  DIAGNOSTIC FINDINGS: Per EMR  CT Head Wo Contrast  IMPRESSION: No CT evidence of acute intracranial abnormality.   Small right frontal scalp hematoma.   Unchanged pituitary/sellar mass.   No acute fracture or traumatic malalignment in the cervical spine.   Degenerative changes and postsurgical changes as above.   Similar appearance of left thyroid  nodule. Reiterate recommendation for thyroid  ultrasound if not previously performed.     Electronically Signed   By: Donnice Mania M.D.   On: 06/26/2023 09:33  COGNITION: Overall cognitive status: Impaired Areas of impairment:  Attention: Impaired: Selective, Alternating, Divided Memory: Impaired: Working Teacher, music term Prospective Auditory Awareness: Impaired: Intellectual Executive function: Impaired: Problem solving, Organization, Planning, Error awareness, Self-correction, and Slow processing Functional deficits: Poor safety awareness; aware of some deficits, but not of all. Wife reports more difficulty than pt shares.   COGNITIVE COMMUNICATION: Following directions: Follows multi-step commands inconsistently  Auditory comprehension: Impaired: 2/2 to attention/working memory Verbal  expression: WFL Functional communication: Impaired: see clinical impression  ORAL MOTOR EXAMINATION: Overall status: Did not assess Comments: NA  STANDARDIZED ASSESSMENTS:   Cognitive Linguistic Quick Test: AGE - 18 - 69   The Cognitive Linguistic Quick Test (CLQT) was administered to assess the relative status of five cognitive domains: attention, memory, language, executive functioning, and visuospatial skills. Scores from 10 tasks were used to estimate severity ratings (standardized for age groups 18-69 years and 70-89 years) for each domain, a clock drawing task, as well as an overall composite severity rating of cognition.       Task Score Criterion Cut Scores  Personal Facts 8/8 8  Symbol Cancellation 11/12 11  Confrontation Naming 10/10 10  Clock Drawing  12/13 12  Story Retelling 5/10 6  Symbol Trails 6/10 9  Generative Naming 4/9 5  Design Memory 4/6 5  Mazes  4/8 7  Design Generation 5/13 6     PATIENT REPORTED OUTCOME MEASURES (PROM): Neuro-QOL Cognitive Function:    Alm: 101/140   Andrew Tanner: 69/130 (didn't answer 2 questions)  *wife scored pt more severely than pt scored himself.  TREATMENT DATE:   01/09/24: Pt was seen for skilled ST services targeting cognitve-communication. Pt and wife have decided on a move-in date for ALF. Pt returned completed safety problem solving HEP and provided rationales for answers independently. He was able to provide relevant examples for when he did stop, think, plan, do independently. To see one more session for follow up and discharge.   01/03/24: Pt was seen for skilled ST services targeting cognitive-communication. Discussed his move to assisted living - he feels like after talking to therapists and wife this would be beneficial for him. Pt has been using his notebook consistently - he reports being  disorganized before and now he is feeling more organized. He has listed his routine and has been following. He has been dating his journal entries. He reports organizing his journal and having all information in one place has made him feel more confident. Continued targeting safety awareness. Required overall miNA.   12/20/23: Pt was seen for skilled ST services targeting cognitive-communication. Pt reports he has been developing a system for him to keep track of tasks. Wife took his schedule and created a personal calendar for him. Both pt and wife feel his is making gains in cognition and taking safety more seriously. Pt completed problem solving HEP (~20 questions) with 1 error. He did have some difficulty with cognitive flexibility - finding two solutions to a single problem. SLP educated on the importance of managing fatigue. SLP assisted pt in establishing an organization system for his notebook. Pt was mixing to-do lists with a running commentary of notes. SLP encouraged pt to use titles for each section re: routine, to-dos, and notes; as well as, date his notes at the top of the page.   12/13/23: Pt was seen for skilled ST services targeting cognitive-communication. SLP asked pt to review previous session. Pt was able to recall 2 steps of Stop-Think-Plan-Do - but he does feel like he has been trying to pause and think. Wife reports she feels like Josiel has been making more focused effort to slow down. Pt is showing increased awareness of impairments and is providing examples of where he needs to be mindful re: looking for body language of when someone is going to move to help anticipate. He also reports he has begun writing things down to help support memory. SLP facilitated session by asking pt safety problem solving questions. Pt required overall minA to complete. Provided for HEP. Cont with current POC.   12/06/23: Pt was seen for skilled ST services targeting cognitive-communication. SLP initiated  education on Stop, Think, Plan, Do to reduce impulsivity. SLP used personally-relevant example re: before going to get the mail. Pt perseverated on him falling on the ice one time when attempting to get the mail, and required gentle prompting to return to subject. SLP provided pt with a few safety scenarios to see how he answered each question. Pt would benefit from more practice of this. Overall, pt did appear to have increased insight/awareness into deficits today. We discussed that even if you FEEL you can do something, we should always made safety a priority (I.e. using walker). Pt asked for walker before standing to leave in today's session.   11/29/23: Pt was seen for skilled ST services targeting continued evaluation. See above for CLQT and PROM scores. Pt continues to display decreased awareness of deficits, so SLP gently provided examples of error and discussed how we may fix them. Pt, wife, and SLP collaborated to identify goals for therapy. Goals  have been updated to reflect. Throughout today's session, pt was observed to have difficulty with auditory comprehension. He was unable to follow conversation. He was asked a question and would answer with something off topic. He did mention he has always had trouble with finishing tasks, though it has gotten worse.     PATIENT EDUCATION: Education details: SLP role in cognitive-communication Person educated: Patient and Spouse Education method: Explanation Education comprehension: verbalized understanding and needs further education   GOALS: Goals reviewed with patient? No  SHORT TERM GOALS: Target date: 12/23/23  Complete CLQT, PROM, and update goals Baseline: Goal status: MET  2.  Pt will identify the unsafe situation and provide 1 solution  Baseline:  Goal status: INITIAL  3.  Pt will recall the following steps re: STOP PLAN THINK DO with minA verbal cues Baseline:  Goal status: INITIAL  4.  Pt will recall 2 external memory  strategies independently.  Baseline:  Goal status: INITIAL    LONG TERM GOALS: Target date: 01/22/24  Pt/wife will improve score on PROM Baseline:  Goal status: INITIAL  2.  Pt will demonstrate improved safety awareness by acknowledging cognitive impairments Baseline:  Goal status: INITIAL  3.  Pt will verbalize exact steps (meaning) of re: STOP THINK PLAN DO  Baseline:  Goal status: INITIAL  4.  Wife/pt will report successful use of memory strategies at home/in community.  Baseline:  Goal status: INITIAL    ASSESSMENT:  CLINICAL IMPRESSION: Pt is a 73 yo male who presents to ST OP for evaluation with complaints of cognitive changes post surgery. Pt was recently [facility] in June. Pt and wife endorse re: forgetfulness, difficulty retaining information, difficulty with prospective memory, and safety/judgement. Pt was assessed using CLQT - to complete next session. SLP observed reduced topic maintenance and perseveration on his art. For example, SLP asked what he feels like he struggles with at home and pt never answered the question, but expressed his interest in [employer]. Pt appeared to have decreased insight. It was suggested that wife attention sessions to assist with carryover of strategy. SLP rec skilled ST services to address cognitive-communication impairment to facilitate return to home. .     OBJECTIVE IMPAIRMENTS: include attention, memory, awareness, and executive functioning. These impairments are limiting patient from managing medications, managing appointments, managing finances, household responsibilities, ADLs/IADLs, and effectively communicating at home and in community. Factors affecting potential to achieve goals and functional outcome are ability to learn/carryover information.. Patient will benefit from skilled SLP services to address above impairments and improve overall function.  REHAB POTENTIAL: Good with wife support  PLAN:  SLP FREQUENCY:  1-2x/week  SLP DURATION: 8 weeks  PLANNED INTERVENTIONS: Environmental controls, Cueing hierachy, Cognitive reorganization, Internal/external aids, Functional tasks, Multimodal communication approach, SLP instruction and feedback, Compensatory strategies, Patient/family education, and [id number] Treatment of speech (30 or 45 min)     [employer], CCC-SLP 01/10/2024, 12:22 PM

## 2024-01-11 DIAGNOSIS — R350 Frequency of micturition: Secondary | ICD-10-CM | POA: Diagnosis not present

## 2024-01-15 ENCOUNTER — Encounter: Payer: Self-pay | Admitting: Physical Medicine & Rehabilitation

## 2024-01-15 ENCOUNTER — Encounter: Attending: Physical Medicine & Rehabilitation | Admitting: Physical Medicine & Rehabilitation

## 2024-01-15 VITALS — BP 133/79 | HR 80 | Ht 71.0 in | Wt 158.2 lb

## 2024-01-15 DIAGNOSIS — Z794 Long term (current) use of insulin: Secondary | ICD-10-CM | POA: Diagnosis not present

## 2024-01-15 DIAGNOSIS — E1142 Type 2 diabetes mellitus with diabetic polyneuropathy: Secondary | ICD-10-CM | POA: Diagnosis not present

## 2024-01-15 NOTE — Progress Notes (Signed)
 Subjective:    Patient ID: Andrew Tanner, male    DOB: 1951/03/02, 73 y.o.   MRN: [id number]  HPI  Discussed the use of AI scribe software for clinical note transcription with the patient, who gave verbal consent to proceed.    CC: Diabetic neuropathy. Referred by Dr. Silva.  Andrew Tanner is a 73 y.o. year old male  who  has a past medical history of Anxiety, Atrial myxoma, Basal cell carcinoma, Cancer (HCC), Cataract, Depression, Diabetes mellitus without complication (HCC), Diabetic neuropathy (HCC), Duodenal adenoma, Hyperlipidemia, Hypertension, IC (interstitial cystitis), Neuromuscular disorder (HCC) (01/09/21), Pancreatic lesion, and Squamous cell carcinoma of skin.   They are presenting to PM&R clinic as a new patient for pain management evaluation. They were referred by Dr. Silva for treatment of polyneuropathy pain.  Red flag symptoms: No red flags for back pain endorsed in Hx or ROS     History of Present Illness: Reports onset of symptoms approximately 2.5 years ago, starting with loss of sensation in the right big toe. Symptoms have progressively worsened. Describes a pattern of pain starting in the morning, often initiated by contact with a cold floor. The sensation begins as coldness in the feet, which then transitions to pain. The right foot is more affected than the left. Pain starts on the bottoms of the feet and moves to the tops, with associated sharp pains in the toes. Describes the pre-pain sensation as a tingling or buzzing feeling. Symptoms do not typically interfere with sleep at night, though he may feel it when getting into bed before it subsides. Reports occasional stinging pain in the right knee, possibly related to physical therapy exercises. No symptoms reported in the hands.  Reports significant life events including a Whipple procedure and a hip surgery within the last year, with a 50+ pound weight loss. Reports some cognitive challenges  post-surgery which have improved with effort. Currently uses a walker for stability due to balance issues. Is moving to an assisted living facility next week to better manage mobility and care needs.  Medication Review: - Amitriptyline: Currently taking a low dose. Reports it helps with sleep and relaxation but provides no positive effect on neuropathic pain. - Prozac  (fluoxetine ): Currently taking. - Gabapentin : Previously trialed up to 500mg  daily. Discontinued due to side effects, including orthostatic lightheadedness and a syncopal episode. Reported feeling drunk and too loose. - Lyrica  (pregabalin ): Not previously trialed. - Klonopin  (clonazepam ): 1.5mg  at night for sleep. - Topical agents: Has tried frankincense and myrrh brand cream, a magnesium  spray - OTC analgesics: Aspirin  and Tylenol  are not effective. Avoiding NSAIDs post-surgery.  Past Medical History: - Diabetes mellitus type 2, transitioned from oral agents to insulin . Uses a Dexcom system. Reports difficulty with diet adherence due to limited food options at his current residence. - Peripheral neuropathy. Confirmed by nerve conduction study performed by neurology (Dr. Lomax and Dr. Onita) approximately one year ago. 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 - History of right hip fracture after a fall, treated with hip replacement. - Cervical laminoplasty (C3-C6) with titanium hardware, approximately 12 years ago. - Essential tremor, pre-dating neck surgery.  Other Treatments: - Physical Therapy: Currently engaged in PT to improve balance and strength. - TENS machine: Reports some benefit. - Spinal Cord Stimulator (SCS): Underwent a successful HFX trial with Dr. Vintram which improved walking confidence. Was close to receiving a permanent implant, but this was delayed due to subsequent major surgeries (Whipple, hip). Was later dropped from  the SCS program's active patient list due to time  lapse.  EMG 02/10/22 (from Dr. Margaret neurology note 11/13/23) This is an abnormal study.  There is electrodiagnostic evidence of length-dependent sensorimotor neuropathy, with mixed axonal and demyelinating features.  This can be seen in patient with long history of diabetes.  In addition there is evidence of chronic right C5-6-7 cervical radiculopathy; moderate right carpal tunnel syndrome.   Medications tried: Topical medications - menthol/mg cream- minimal benefit  Nsaids - Ibuprofen  doesn't help  Tylenol  - Doesn't help Opiates - N/a Gabapentin  - developed side effects at 500mg  daily, made his feel strange/passed out when getting up? Lyrica - denies TCAs  amitriptyline helps  SNRIs  on SSRI    Other treatments: PT- currently working with PT TENs unit- helps his pain Surgery  Hip surgery after fracture, Whipple procedure, C spine surgery about 12- years ago    Prior UDS results: No results found for: LABOPIA, COCAINSCRNUR, LABBENZ, AMPHETMU, THCU, LABBARB   Pain Inventory Average Pain 5 Pain Right Now 4 My pain is intermittent, burning, tingling, and aching  In the last 24 hours, has pain interfered with the following? General activity 5 Relation with others 0 Enjoyment of life 5 What TIME of day is your pain at its worst? evening Sleep (in general) Good  Pain is worse with: inactivity Pain improves with: rest and TENS Relief from Meds: 0  use a walker how many minutes can you walk? 20 ability to climb steps?  yes do you drive?  no  retired I need assistance with the following:  bathing, meal prep, household duties, and shopping  bladder control problems weakness tremor tingling depression anxiety  Any changes since last visit?  no  Any changes since last visit?  no    Family History  Problem Relation Age of Onset   Lung cancer Mother    Heart disease Father    Hyperlipidemia Father    Diabetes Father        type 1   Diabetes Brother     Depression Brother    Renal cancer Brother    Anxiety disorder Brother    Stomach cancer Neg Hx    Colon cancer Neg Hx    Pancreatic cancer Neg Hx    Esophageal cancer Neg Hx    Rectal cancer Neg Hx    Social History   Socioeconomic History   Marital status: Married    Spouse name: Andrew Tanner   Number of children: 1   Years of education: Not on file   Highest education level: Bachelor's degree (e.g., BA, AB, BS)  Occupational History   Not on file  Tobacco Use   Smoking status: Former    Current packs/day: 0.00    Average packs/day: 1 pack/day for 5.0 years (5.0 ttl pk-yrs)    Types: Cigarettes    Start date: 03/02/1977    Quit date: 03/02/1982    Years since quitting: 41.9   Smokeless tobacco: Never   Tobacco comments:    quit 28 yrs ago  Vaping Use   Vaping status: Never Used  Substance and Sexual Activity   Alcohol use: Not Currently    Comment: none   Drug use: No   Sexual activity: Not Currently    Birth control/protection: Abstinence  Other Topics Concern   Not on file  Social History Narrative   Retired Risk analyst, 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   Social Drivers of Corporate investment banker Strain:  Low Risk  (01/08/2024)   Overall Financial Resource Strain (CARDIA)    Difficulty of Paying Living Expenses: Not hard at all  Food Insecurity: No Food Insecurity (01/08/2024)   Hunger Vital Sign    Worried About Running Out of Food in the Last Year: Never true    Ran Out of Food in the Last Year: Never true  Transportation Needs: No Transportation Needs (01/08/2024)   PRAPARE - Administrator, Civil Service (Medical): No    Lack of Transportation (Non-Medical): No  Physical Activity: Sufficiently Active (01/08/2024)   Exercise Vital Sign    Days of Exercise per Week: 5 days    Minutes of Exercise per Session: 50 min  Stress: No Stress Concern Present (01/08/2024)   [employer] of Occupational Health - Occupational Stress  Questionnaire    Feeling of Stress: Not at all  Social Connections: Socially Integrated (01/08/2024)   Social Connection and Isolation Panel    Frequency of Communication with Friends and Family: More than three times a week    Frequency of Social Gatherings with Friends and Family: Three times a week    Attends Religious Services: More than 4 times per year    Active Member of Clubs or Organizations: Yes    Attends Banker Meetings: 1 to 4 times per year    Marital Status: Married   Past Surgical History:  Procedure Laterality Date   BIOPSY  12/05/2022   Procedure: BIOPSY;  Surgeon: Mansouraty, Aloha Raddle., MD;  Location: [facility];  Service: Gastroenterology;;   CATARACT EXTRACTION, BILATERAL     ESOPHAGOGASTRODUODENOSCOPY N/A 12/05/2022   Procedure: ESOPHAGOGASTRODUODENOSCOPY (EGD);  Surgeon: Wilhelmenia Aloha Raddle., MD;  Location: [facility];  Service: Gastroenterology;  Laterality: N/A;   EUS N/A 12/05/2022   Procedure: UPPER ENDOSCOPIC ULTRASOUND (EUS) RADIAL;  Surgeon: Wilhelmenia Aloha Raddle., MD;  Location: [facility];  Service: Gastroenterology;  Laterality: N/A;   FINE NEEDLE ASPIRATION N/A 12/05/2022   Procedure: FINE NEEDLE ASPIRATION (FNA) LINEAR;  Surgeon: Wilhelmenia Aloha Raddle., MD;  Location: [facility];  Service: Gastroenterology;  Laterality: N/A;   HERNIA REPAIR N/A    inguinal   NECK SURGERY     ant/post   SPINE SURGERY     SUBMUCOSAL TATTOO INJECTION  12/05/2022   Procedure: SUBMUCOSAL TATTOO INJECTION;  Surgeon: Wilhelmenia Aloha Raddle., MD;  Location: [facility];  Service: Gastroenterology;;   TOTAL HIP ARTHROPLASTY Left 07/13/2023   Procedure: ARTHROPLASTY, HIP, TOTAL, ANTERIOR APPROACH;  Surgeon: Kendal Franky SQUIBB, MD;  Location: [facility];  Service: Orthopedics;  Laterality: Left;  [employer] bipolar Hana table  hemi hip   TRANSURETHRAL RESECTION OF PROSTATE     TURP VAPORIZATION     WHIPPLE PROCEDURE N/A 04/27/2023   Procedure: WHIPPLE  PROCEDURE;  Surgeon: Aron Shoulders, MD;  Location: [facility];  Service: General;  Laterality: N/A;   Past Medical History:  Diagnosis Date   Anxiety    Atrial myxoma    Basal cell carcinoma    Cancer (HCC)    basal cell skin CA   Cataract    Depression    Diabetes mellitus without complication (HCC)    Diabetic neuropathy (HCC)    Duodenal adenoma    Hyperlipidemia    Hypertension    IC (interstitial cystitis)    Neuromuscular disorder (HCC) 01/09/21   Peripheral  Neuropathy   Pancreatic lesion    Squamous cell carcinoma of skin    BP 133/79   Pulse 80  Ht 5' 11 (1.803 m)   Wt 158 lb 3.2 oz (71.8 kg)   SpO2 97%   BMI 22.06 kg/m   Opioid Risk Score:   Fall Risk Score:  `1  Depression screen [facility] 2/9     01/15/2024   11:31 AM 01/08/2024    1:23 PM 12/27/2023    4:31 PM 03/20/2023    9:56 AM 01/16/2023   10:34 AM 01/03/2023    1:53 PM 11/21/2022    8:24 AM  Depression screen PHQ 2/9  Decreased Interest 0 0 0 0 0 0 1  Down, Depressed, Hopeless 0 0 2 1 2  0 1  PHQ - 2 Score 0 0 2 1 2  0 2  Altered sleeping 0 0 2 0 0 0 1  Tired, decreased energy 0 0 1 0 1 0 0  Change in appetite    0 0 0 0  Feeling bad or failure about yourself  0 0 0 0 0 0 0  Trouble concentrating 1 0 0 0 0 0 0  Moving slowly or fidgety/restless 1 0 0 0 0 0 0  Suicidal thoughts 0 0 0  0 0 0  PHQ-9 Score 2 0 5 1 3  0 3  Difficult doing work/chores Not difficult at all Not difficult at all Somewhat difficult Not difficult at all Not difficult at all Not difficult at all Somewhat difficult     Review of Systems  Musculoskeletal:  Positive for gait problem.  Neurological:  Positive for tremors, weakness and numbness.       Tingling  Psychiatric/Behavioral:  Positive for dysphoric mood. The patient is nervous/anxious.   All other systems reviewed and are negative.      Objective:   Physical Exam   Gen: no distress, normal appearing HEENT: oral mucosa pink and moist, NCAT Chest: normal effort, normal  rate of breathing Abd: soft, non-distended Ext: no edema Psych: pleasant, normal affect Skin: intact Neuro: Alert and awake, follows commands, cranial nerves II through XII grossly intact, normal speech and language RUE: 5/5 Deltoid, 5/5 Biceps, 5/5 Triceps, 5/5 Wrist Ext, 5/5 Grip LUE: 5/5 Deltoid, 5/5 Biceps, 5/5 Triceps, 5/5 Wrist Ext, 5/5 Grip RLE: HF 5/5, KE 5/5, ADF 4/5, APF 4/5 LLE: HF 5/5, KE 5/5, ADF 4/5, APF 4/5  - Sensation: Sensation to light touch is reportedly altered in the feet, described as feeling cold. The right foot is more affected than the left. Sensation is intact proximally in the lower legs. No open wounds or ulcers noted on the feet.  No abnormal tone noted Musculoskeletal:  - Gait: Ambulates with a walker for stability. Gait appears cautious. - Strength: - Reflexes: Patellar reflexes are 1+ and symmetric. Achilles reflexes are absent bilaterally. - Palpation: No tenderness to palpation of knees or along the spine. Surgical scar visible on the  neck well-healed.     Assessment & Plan:    ASSESSMENT 1.  Diabetic polyneuropathy:Symptomatic, affecting feet bilaterally (R>L), characterized by painful cold sensations, tingling, and sharp pains. Current priority is improving balance and strength, making him hesitant to trial medications with potential cognitive or balance-related side effects. 2.  History of multiple complex surgeries: Including Whipple procedure and hip replacement, contributing to deconditioning and balance impairment.  PLAN 1.  Qutenza (capsaicin 8%) patch: Discussed as a treatment option. This was also previously suggested by his neurology NP. The low risk of systemic side effects is appealing given his focus on maintaining balance and cognition for PT.      -  Will schedule for the next available appointment for application to bilateral feet (in 3+ weeks to allow for his move to assisted living).     - Office to check insurance coverage.  Counseled that benefit may be cumulative over several treatments, which are administered every 3 months. 2.  Pharmacotherapy:     - Lyrica : Discussed as an alternative oral agent. Due to his history of side effects with gabapentin  and current focus on balance, he has elected to defer trial of Lyrica . He will call if he changes his mind and wishes to start a trial at a low dose.     - Cymbalta : Discussed as another option, but noted this would require coordination with his psychiatrist to manage the transition from Prozac .     - Amitriptyline: Advised he can discontinue this if he feels it is not providing benefit. Recommended making one medication change at a time. 3.  Continue current management:     - Continue active participation in PT for strength and balance.     - Continue use of TENS machine as needed.     - Continue management of diabetes with his primary team.  As scheduled for Qtenza patch application. He will call if symptoms worsen or if he wishes to discuss medication changes sooner.

## 2024-01-16 ENCOUNTER — Ambulatory Visit: Admitting: Occupational Therapy

## 2024-01-16 ENCOUNTER — Ambulatory Visit

## 2024-01-16 ENCOUNTER — Ambulatory Visit: Admitting: Speech Pathology

## 2024-01-16 ENCOUNTER — Telehealth: Payer: Self-pay | Admitting: Family Medicine

## 2024-01-16 ENCOUNTER — Encounter: Payer: Self-pay | Admitting: Speech Pathology

## 2024-01-16 DIAGNOSIS — R296 Repeated falls: Secondary | ICD-10-CM | POA: Diagnosis not present

## 2024-01-16 DIAGNOSIS — R41841 Cognitive communication deficit: Secondary | ICD-10-CM | POA: Diagnosis not present

## 2024-01-16 DIAGNOSIS — R2689 Other abnormalities of gait and mobility: Secondary | ICD-10-CM | POA: Diagnosis not present

## 2024-01-16 DIAGNOSIS — R278 Other lack of coordination: Secondary | ICD-10-CM

## 2024-01-16 DIAGNOSIS — M6281 Muscle weakness (generalized): Secondary | ICD-10-CM | POA: Diagnosis not present

## 2024-01-16 DIAGNOSIS — R41840 Attention and concentration deficit: Secondary | ICD-10-CM | POA: Diagnosis not present

## 2024-01-16 DIAGNOSIS — R41844 Frontal lobe and executive function deficit: Secondary | ICD-10-CM | POA: Diagnosis not present

## 2024-01-16 NOTE — Therapy (Unsigned)
 OUTPATIENT OCCUPATIONAL THERAPY NEURO treatment  Patient Name: Andrew Tanner MRN: [id number] DOB:[birth year], 73 y.o., male Today's Date: 01/16/2024  PCP: Dr. Adaline  REFERRING PROVIDER: Dr. Adaline  END OF SESSION:  OT End of Session - 01/16/24 1328     Visit Number 8    Number of Visits 25    Date for Recertification  02/07/24    Authorization Type [employer] Medicare    Authorization - Visit Number 8    Progress Note Due on Visit 10    OT Start Time 1320    OT Stop Time 1400    OT Time Calculation (min) 40 min    Activity Tolerance Patient tolerated treatment well    Behavior During Therapy WFL for tasks assessed/performed            Past Medical History:  Diagnosis Date   Anxiety    Atrial myxoma    Basal cell carcinoma    Cancer (HCC)    basal cell skin CA   Cataract    Depression    Diabetes mellitus without complication (HCC)    Diabetic neuropathy (HCC)    Duodenal adenoma    Hyperlipidemia    Hypertension    IC (interstitial cystitis)    Neuromuscular disorder (HCC) 01/09/21   Peripheral  Neuropathy   Pancreatic lesion    Squamous cell carcinoma of skin    Past Surgical History:  Procedure Laterality Date   BIOPSY  12/05/2022   Procedure: BIOPSY;  Surgeon: Wilhelmenia Aloha Raddle., MD;  Location: [facility];  Service: Gastroenterology;;   CATARACT EXTRACTION, BILATERAL     ESOPHAGOGASTRODUODENOSCOPY N/A 12/05/2022   Procedure: ESOPHAGOGASTRODUODENOSCOPY (EGD);  Surgeon: Wilhelmenia Aloha Raddle., MD;  Location: [facility];  Service: Gastroenterology;  Laterality: N/A;   EUS N/A 12/05/2022   Procedure: UPPER ENDOSCOPIC ULTRASOUND (EUS) RADIAL;  Surgeon: Wilhelmenia Aloha Raddle., MD;  Location: [facility];  Service: Gastroenterology;  Laterality: N/A;   FINE NEEDLE ASPIRATION N/A 12/05/2022   Procedure: FINE NEEDLE ASPIRATION (FNA) LINEAR;  Surgeon: Wilhelmenia Aloha Raddle., MD;  Location: [facility];  Service: Gastroenterology;  Laterality: N/A;    HERNIA REPAIR N/A    inguinal   NECK SURGERY     ant/post   SPINE SURGERY     SUBMUCOSAL TATTOO INJECTION  12/05/2022   Procedure: SUBMUCOSAL TATTOO INJECTION;  Surgeon: Wilhelmenia Aloha Raddle., MD;  Location: [facility];  Service: Gastroenterology;;   TOTAL HIP ARTHROPLASTY Left 07/13/2023   Procedure: ARTHROPLASTY, HIP, TOTAL, ANTERIOR APPROACH;  Surgeon: Kendal Franky SQUIBB, MD;  Location: [facility];  Service: Orthopedics;  Laterality: Left;  [employer] bipolar Hana table  hemi hip   TRANSURETHRAL RESECTION OF PROSTATE     TURP VAPORIZATION     WHIPPLE PROCEDURE N/A 04/27/2023   Procedure: WHIPPLE PROCEDURE;  Surgeon: Aron Shoulders, MD;  Location: [facility] OR;  Service: General;  Laterality: N/A;   Patient Active Problem List   Diagnosis Date Noted   Atrial myxoma 10/31/2023   History of left hip replacement 08/28/2023   Fracture of femoral neck, left (HCC) 07/13/2023   Protein-calorie malnutrition, severe 05/17/2023   Vomiting and diarrhea 05/13/2023   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   Malnutrition of moderate degree 05/08/2023   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   Poorly controlled type 2 diabetes mellitus with circulatory disorder (HCC) 02/08/2023   Vitamin D  deficiency 07/25/2022   Pain in both feet 02/10/2022   Peripheral neuropathy 01/25/2022   Seborrheic dermatitis 01/25/2022  Positive colorectal cancer screening using Cologuard test 03/16/2020   Chronic RLQ pain 03/16/2020   Type 2 diabetes mellitus with hyperglycemia, without long-term current use of insulin  (HCC) 06/02/2011   Hypertension 06/02/2011   Hyperlipemia 06/02/2011   Cataract 06/02/2011    ONSET DATE: referral 10/02/23  REFERRING DIAG: Generalized weakness, Idiopathy peripheral neuropathy  L hip replacement   THERAPY DIAG:  Other abnormalities of gait and mobility  Muscle weakness (generalized)  Other lack of coordination  Frontal lobe and  executive function deficit  Attention and concentration deficit  Rationale for Evaluation and Treatment: Rehabilitation  SUBJECTIVE:   SUBJECTIVE STATEMENT: Pt reports he is moving to ALF next week Pt accompanied by: significant other  PERTINENT HISTORY:Pt is a 73 y.o. male admitted via EMS 07/12/23 for fall at rehab facility. X-ray showed L femoral neck fx. L THA performed 4/3. WBAT PMH: Whipple's procedure in January,  basal cell carcinoma, diabetes mellitus, diabetic neuropathy, neuromuscular disorder, hyperlipidemia, malignant neuroendocrine tumor of duodenum  PRECAUTIONS: Fall, need to clarify any restrictions related to hip replacement  WEIGHT BEARING RESTRICTIONS: Yes WBAT  PAIN: no reports of pain   FALLS: Has patient fallen in last 6 months? Yes. Number of falls multiple  LIVING ENVIRONMENT: Lives with: lives with their family and lives in an adult home, pt is staying in a home with personal care assistance since he can't climb stairs at home Lives in: House/apartment Stairs: No Has following equipment at home: Walker - 4 wheeled and shower chair  PLOF: Independent  PATIENT GOALS: improve independence, move back home  OBJECTIVE:  Note: Objective measures were completed at Evaluation unless otherwise noted.  HAND DOMINANCE: Right  ADLs:has assist for starting shower, pt performs  with supervision Overall ADLs: increased time required Transfers/ambulation related to ADLs: Eating: mod I  UB Dressing: supervision LB Dressing: supervision Toileting: mod I with walker Bathing: supervision Tub Shower transfers: has tub seat, min A Equipment: Shower seat with back  IADLs:Dependent for IADLs  Handwriting: 100% legible  MOBILITY STATUS: supervision     ACTIVITY TOLERANCE: Activity tolerance: decreased overall endurance   UPPER EXTREMITY ROM:  A/ROM WFLs    UPPER EXTREMITY MMT:     MMT Right eval Left eval  Shoulder flexion 4-/5 4-/5  Shoulder  abduction    Shoulder adduction    Shoulder extension    Shoulder internal rotation    Shoulder external rotation    Middle trapezius    Lower trapezius    Elbow flexion 4/5 4/5  Elbow extension 4/5 4/5  Wrist flexion    Wrist extension    Wrist ulnar deviation    Wrist radial deviation    Wrist pronation    Wrist supination    (Blank rows = not tested)  HAND FUNCTION: Grip strength: Right: 75 lbs; Left: 70 lbs  COORDINATION: 9 Hole Peg test: Right: 36.84 sec; Left: 35.46 sec, tremors and increased difficulty  SENSATION: Not tested    COGNITION: Overall cognitive status: decreased short term memory, correctly completes trail making A, unable to correctly complete trail making B- decreased alternating attention    VISION ASSESSMENT:NT, wears glasses Pt  was noted to bump into door frame x 2 with walker, vision to be further assessed within a functional context.     OBSERVATIONS:  11/15/23-Plesant gentleman with complex medical history accompanied by his wife. Pt is and artist.Pt is living in an adult care home since he can't navigate the stairs at home.  TREATMENT DATE:01/16/24 UBE x 6 mins level 3 for conditioning Pt will move to ALF next week. Organization task to write out items needed to paint and clean a new apartment. Pt was able to generate a comprehensive list without v.c. indpendently Constant therapy: Tassk for alternating attention, Alternating words  level 2- 86% Alternating symbols level 4 -97% discussion with patient regarding perfromance, importance of slowing down and using an organized scan pattern. Therapist discussed how this would translate into daily activities and improtance of using these same strategies at ALF. Discussion with pt / wife regarding plans to d/c next visit.  01/09/24 Pt/ wife have decided to transition pt to ALF  in October discussed the transition. Pt may benefit from walker tray or use of rollator  to carry items at new location. Pt wrote out several safety recommendations for transition. Ambulating while locating items in a busy environment with focus on safety and organized scanning 12/15 items located first pass, min v.c to locate remainder. Therapist recommends pt looks for items in a store for home work  UBE x 6 mins level 3 for conditioning   01/03/24- Discussion with pt/ wife regarding pt's potential transition to ALF. Pt is adamantly opposed and wants to go home. Pt needs to be more I and able to climb a flight of stairs mod I. Pt was encouraged to consider the ALF as he is currently living in a group home. He would have better socialization, meals for DM and access to therapy on site. Reviewed memory strategies that pt is using. Funational dynamic activities in standing: weightbearing through bilateral UE's rocking forwards and backwards then countetop pushups for management of tremors. copying small peg design on vertical surface with left and right UE's mod-max diffity due to tremors. Pt stood for 10 mins total prior to rest, and he transitioned to working on Estate agent on countertop and table  with min-mod difficulty and v.c for compensation of tremors.   12/26/23 UBE x 6 mins level 3 for conditioning  Pt reports he has washed 3 loads of clothing since last visit and has made his bed initally with supervision. Pt generated a list of steps involved in making an egg sandwich. Therapist provided recommendations. Pt initally only wrote out the steps involved in making an egg, min-mod v.c to add additional details and complete task. Therapist recommended pt gathers all items prior to starting task. Therpist discussed safety with task and need for supervision initally. Discussed strategies for thoroughness with bathing and using a system with pt/ wife. Pt's wife to discuss with caregiver. Reveiwed red  theraband HEP, mod v.c 10-15 reps each.   12/20/23- Discussed activities pt can begin performing to maximize I with ADLs/IADLs. (Pt has been very focused on his artwork and is doing well with the artwork). Pt to start performing laundry at his group home and possibly simple cooking with supervision. Pt simulated carrying pillowcase full of dirty laundry to and from target and unloading with supervision. Pt wrote out steps involved in performing laundry to organize task. Pt wrote out steps however it was initally for what he would do in his home.Pt is currently living in a group home and situation would be different. Pt then wrote out steps he would need to do to perform task at group home. Pt omitted several steps and required mod v.c to add aditional steps. Pt to write out organized list for home work.  Standing at countertop to perfrom dynamic functional reaching with left and right UE's  with close supervision and min v.c  12/13/23- memory/ cognitve tips education, see pt instructions Pt/ wife were instructed in red theraband HEP, min-mod v.c and demonstration, 10-15 reps each bilateral UE's  12/06/23- Pt/ wife were instructed in HEP for coordination, see pt instructions. Pt practiced printing using foam grip and foam grip was issued for handwriting. Pt demonstrates improved printing with v.c to slow down and form letters deliberately. Pt was cued to rest arms on table for improving tremors. Pt was instructed in flicks. 11/15/23- eval         PATIENT EDUCATION: Education details:  see above Person educated: Patient and Spouse Education method: Explanation, demonstration, v.c,  Education comprehension: verbalized understanding, returned demonstration,  HOME EXERCISE PROGRAM: coordiantion, writing 12/13/23- red t band, memory/ cognitve strategies   GOALS: Goals reviewed with patient? Yes  SHORT TERM GOALS: Target date: 12/16/23  I with inital HEP.  Goal status: ongoing  01/16/24  2.  Pt  will verbalize understanding of memory compensations.  Goal status: met., 01/03/24  3.  Pt will perform an alternating attention task with 80% accuracy for increased ease with IADLs.  Goal status:  met 86%, 95% today with alternating attention tasks 01/16/24  4.  Pt will perfrom dynamic activities in standing x 10 mins without LOB for increaed ease with ADLs/IADLs.  Goal status:  met, Pt stood for 10 mins today  5.  Pt will perform an organization/ scheduling task with 90% or better accuracy (plan menu/ grocery list, organizing your day task)  Goal status:  met, pt was able to generate a list of items for moving into a new apartment without v.c today 01/16/24   LONG TERM GOALS: Target date: 02/07/24  I with updated HEP.  Goal status: ongoing 01/16/24  2.  Pt will perform simple snack or sandwich prep mod I.  Goal status:ongoing, 01/16/24  3.  Pt will perform simple IADL tasks such as folding laundry and washing dishes swith supervision.  Goal status: partially met, Pt is performing laundry with distant supervision. 01/16/24  4.  Pt will navigate a busy environment without bumping into items and will locate items with 90% or better accuracy. Baseline:  Goal status: ongoing 12/15 located 01/09/24  5.  Pt will demonstrate improved alternating attention as evidenced by completing trailmaking B correctly in 3 mins or less.  Goal status: ongoing, 01/16/24    ASSESSMENT:  CLINICAL IMPRESSION: Pt is progressing towards goals.He demonstrated improved organization today and was able to plan the items he would need to move into an new apartment without v.c. PERFORMANCE DEFICITS: in functional skills including ADLs, IADLs, coordination, dexterity, strength, pain, flexibility, Fine motor control, Gross motor control, mobility, balance, endurance, decreased knowledge of precautions, decreased knowledge of use of DME, vision, and UE functional use, cognitive skills including attention, memory,  problem solving, safety awareness, sequencing, and temperament/personality, and psychosocial skills including coping strategies, environmental adaptation, habits, interpersonal interactions, and routines and behaviors.   IMPAIRMENTS: are limiting patient from ADLs, IADLs, work, play, leisure, and social participation.   CO-MORBIDITIES: may have co-morbidities  that affects occupational performance. Patient will benefit from skilled OT to address above impairments and improve overall function.  MODIFICATION OR ASSISTANCE TO COMPLETE EVALUATION: No modification of tasks or assist necessary to complete an evaluation.  OT OCCUPATIONAL PROFILE AND HISTORY: Detailed assessment: Review of records and additional review of physical, cognitive, psychosocial history related to current functional performance.  CLINICAL DECISION MAKING: LOW - limited treatment options, no task modification  necessary  REHAB POTENTIAL: Good  EVALUATION COMPLEXITY: Low    PLAN:  OT FREQUENCY: 1-2x/week  OT DURATION: 12 weeks  PLANNED INTERVENTIONS: 97168 OT Re-evaluation, 97535 self care/ADL training, 02889 therapeutic exercise, 97530 therapeutic activity, 97112 neuromuscular re-education, 97140 manual therapy, 97035 ultrasound, 97018 paraffin, 02989 moist heat, 97010 cryotherapy, 97129 Cognitive training (first 15 min), 02869 Cognitive training(each additional 15 min), balance training, functional mobility training, visual/perceptual remediation/compensation, psychosocial skills training, energy conservation, coping strategies training, patient/family education, and DME and/or AE instructions  RECOMMENDED OTHER SERVICES: PT, ST  CONSULTED AND AGREED WITH PLAN OF CARE: Patient and family member/caregiver  PLAN FOR NEXT SESSION:   anticpate d/c next visit, check progress toward remaining goals, trailmaking B, review HEP,    Oluwadamilare Tobler, OT 01/16/2024, 1:29 PM

## 2024-01-16 NOTE — Telephone Encounter (Signed)
 Patient dropped off document FL2, to be filled out by provider. Patient requested to send it back via Fax to Fax# [phone] or call Patient's wife [name] to pick up within 5-days. Document is located in providers tray at front office.Please advise at Mobile [phone] (mobile)

## 2024-01-16 NOTE — Therapy (Unsigned)
 OUTPATIENT SPEECH LANGUAGE PATHOLOGY TREATMENT   Patient Name: Andrew Tanner MRN: [id number] DOB:04/09/1951, 73 y.o., male Today's Date: 01/16/2024  PCP: Wendolyn Jenkins Jansky, MD  REFERRING PROVIDER: Wendolyn Jenkins Jansky, MD  END OF SESSION:  End of Session - 01/16/24 1234     Visit Number 8    Number of Visits 12    Date for Recertification  01/21/24    SLP Start Time 1230    SLP Stop Time  1310    SLP Time Calculation (min) 40 min    Activity Tolerance Patient tolerated treatment well          Past Medical History:  Diagnosis Date   Anxiety    Atrial myxoma    Basal cell carcinoma    Cancer (HCC)    basal cell skin CA   Cataract    Depression    Diabetes mellitus without complication (HCC)    Diabetic neuropathy (HCC)    Duodenal adenoma    Hyperlipidemia    Hypertension    IC (interstitial cystitis)    Neuromuscular disorder (HCC) 01/09/21   Peripheral  Neuropathy   Pancreatic lesion    Squamous cell carcinoma of skin    Past Surgical History:  Procedure Laterality Date   BIOPSY  12/05/2022   Procedure: BIOPSY;  Surgeon: Wilhelmenia Aloha Raddle., MD;  Location: [facility];  Service: Gastroenterology;;   CATARACT EXTRACTION, BILATERAL     ESOPHAGOGASTRODUODENOSCOPY N/A 12/05/2022   Procedure: ESOPHAGOGASTRODUODENOSCOPY (EGD);  Surgeon: Wilhelmenia Aloha Raddle., MD;  Location: [facility];  Service: Gastroenterology;  Laterality: N/A;   EUS N/A 12/05/2022   Procedure: UPPER ENDOSCOPIC ULTRASOUND (EUS) RADIAL;  Surgeon: Wilhelmenia Aloha Raddle., MD;  Location: [facility];  Service: Gastroenterology;  Laterality: N/A;   FINE NEEDLE ASPIRATION N/A 12/05/2022   Procedure: FINE NEEDLE ASPIRATION (FNA) LINEAR;  Surgeon: Wilhelmenia Aloha Raddle., MD;  Location: [facility];  Service: Gastroenterology;  Laterality: N/A;   HERNIA REPAIR N/A    inguinal   NECK SURGERY     ant/post   SPINE SURGERY     SUBMUCOSAL TATTOO INJECTION  12/05/2022   Procedure: SUBMUCOSAL TATTOO  INJECTION;  Surgeon: Wilhelmenia Aloha Raddle., MD;  Location: [facility];  Service: Gastroenterology;;   TOTAL HIP ARTHROPLASTY Left 07/13/2023   Procedure: ARTHROPLASTY, HIP, TOTAL, ANTERIOR APPROACH;  Surgeon: Kendal Franky SQUIBB, MD;  Location: [facility];  Service: Orthopedics;  Laterality: Left;  [employer] bipolar Hana table  hemi hip   TRANSURETHRAL RESECTION OF PROSTATE     TURP VAPORIZATION     WHIPPLE PROCEDURE N/A 04/27/2023   Procedure: WHIPPLE PROCEDURE;  Surgeon: Aron Shoulders, MD;  Location: [facility];  Service: General;  Laterality: N/A;   Patient Active Problem List   Diagnosis Date Noted   Atrial myxoma 10/31/2023   History of left hip replacement 08/28/2023   Fracture of femoral neck, left (HCC) 07/13/2023   Protein-calorie malnutrition, severe 05/17/2023   Vomiting and diarrhea 05/13/2023   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   Malnutrition of moderate degree 05/08/2023   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   Poorly controlled type 2 diabetes mellitus with circulatory disorder (HCC) 02/08/2023   Vitamin D  deficiency 07/25/2022   Pain in both feet 02/10/2022   Peripheral neuropathy 01/25/2022   Seborrheic dermatitis 01/25/2022   Positive colorectal cancer screening using Cologuard test 03/16/2020   Chronic RLQ pain 03/16/2020   Type 2 diabetes mellitus with hyperglycemia, without long-term current use of insulin  (HCC)  06/02/2011   Hypertension 06/02/2011   Hyperlipemia 06/02/2011   Cataract 06/02/2011    ONSET DATE: Referred on  11/02/23  REFERRING DIAG: None provided; reportedly here for brain fog 2/2 anesthesia post surgeries  THERAPY DIAG:  Cognitive communication deficit  Rationale for Evaluation and Treatment: Rehabilitation  SUBJECTIVE:   SUBJECTIVE STATEMENT:  Pt will be heading to Assisted Living   Pt accompanied by: self and significant other; [name]  PERTINENT HISTORY: Per chart review:  73 year old male with history of diabetes, neuropathy, hypertension, hyperlipidemia here for evaluation of memory, cognitive changes and generalized weakness.   PAIN:  Are you having pain? Yes; 1/10 (soreness); neuropathy   FALLS: Has patient fallen in last 6 months?  Yes, Number of falls: 4   LIVING ENVIRONMENT: Lives with: lives in an adult home; [facility] Lives in: Group home; hoping to return home   *under Long term care   PLOF:  Level of assistance: Needed assistance with ADLs, Needed assistance with IADLS; two surgeries (whipple 04/2023 and hip 07/2023)  Employment: Retired; full Control and instrumentation engineer   PATIENT GOALS: to return back home  OBJECTIVE:  Note: Objective measures were completed at Evaluation unless otherwise noted.  DIAGNOSTIC FINDINGS: Per EMR  CT Head Wo Contrast  IMPRESSION: No CT evidence of acute intracranial abnormality.   Small right frontal scalp hematoma.   Unchanged pituitary/sellar mass.   No acute fracture or traumatic malalignment in the cervical spine.   Degenerative changes and postsurgical changes as above.   Similar appearance of left thyroid  nodule. Reiterate recommendation for thyroid  ultrasound if not previously performed.     Electronically Signed   By: Donnice Mania M.D.   On: 06/26/2023 09:33  COGNITION: Overall cognitive status: Impaired Areas of impairment:  Attention: Impaired: Selective, Alternating, Divided Memory: Impaired: Working Teacher, music term Prospective Auditory Awareness: Impaired: Intellectual Executive function: Impaired: Problem solving, Organization, Planning, Error awareness, Self-correction, and Slow processing Functional deficits: Poor safety awareness; aware of some deficits, but not of all. Wife reports more difficulty than pt shares.   COGNITIVE COMMUNICATION: Following directions: Follows multi-step commands inconsistently  Auditory comprehension: Impaired: 2/2 to attention/working memory Verbal  expression: WFL Functional communication: Impaired: see clinical impression  ORAL MOTOR EXAMINATION: Overall status: Did not assess Comments: NA  STANDARDIZED ASSESSMENTS:   Cognitive Linguistic Quick Test: AGE - 18 - 69   The Cognitive Linguistic Quick Test (CLQT) was administered to assess the relative status of five cognitive domains: attention, memory, language, executive functioning, and visuospatial skills. Scores from 10 tasks were used to estimate severity ratings (standardized for age groups 18-69 years and 70-89 years) for each domain, a clock drawing task, as well as an overall composite severity rating of cognition.       Task Score Criterion Cut Scores  Personal Facts 8/8 8  Symbol Cancellation 11/12 11  Confrontation Naming 10/10 10  Clock Drawing  12/13 12  Story Retelling 5/10 6  Symbol Trails 6/10 9  Generative Naming 4/9 5  Design Memory 4/6 5  Mazes  4/8 7  Design Generation 5/13 6     PATIENT REPORTED OUTCOME MEASURES (PROM): Neuro-QOL Cognitive Function:    Romone: 101/140; 91/140 (01/16/24 * indicating improved awareness of impairments)   [name]: 69/130 (didn't answer 2 questions); 80/140 ( 01/16/24 *indicating improved deficits)   *wife scored pt more severely than pt scored himself.  TREATMENT DATE:   01/09/24: Pt was seen for skilled ST services targeting cognitve-communication. Pt and wife have decided on a move-in date for ALF. Pt returned completed safety problem solving HEP and provided rationales for answers independently. He was able to provide relevant examples for when he did stop, think, plan, do independently. To see one more session for follow up and discharge.   01/03/24: Pt was seen for skilled ST services targeting cognitive-communication. Discussed his move to assisted living - he feels like after talking to therapists  and wife this would be beneficial for him. Pt has been using his notebook consistently - he reports being disorganized before and now he is feeling more organized. He has listed his routine and has been following. He has been dating his journal entries. He reports organizing his journal and having all information in one place has made him feel more confident. Continued targeting safety awareness. Required overall miNA.   12/20/23: Pt was seen for skilled ST services targeting cognitive-communication. Pt reports he has been developing a system for him to keep track of tasks. Wife took his schedule and created a personal calendar for him. Both pt and wife feel his is making gains in cognition and taking safety more seriously. Pt completed problem solving HEP (~20 questions) with 1 error. He did have some difficulty with cognitive flexibility - finding two solutions to a single problem. SLP educated on the importance of managing fatigue. SLP assisted pt in establishing an organization system for his notebook. Pt was mixing to-do lists with a running commentary of notes. SLP encouraged pt to use titles for each section re: routine, to-dos, and notes; as well as, date his notes at the top of the page.   12/13/23: Pt was seen for skilled ST services targeting cognitive-communication. SLP asked pt to review previous session. Pt was able to recall 2 steps of Stop-Think-Plan-Do - but he does feel like he has been trying to pause and think. Wife reports she feels like [place] has been making more focused effort to slow down. Pt is showing increased awareness of impairments and is providing examples of where he needs to be mindful re: looking for body language of when someone is going to move to help anticipate. He also reports he has begun writing things down to help support memory. SLP facilitated session by asking pt safety problem solving questions. Pt required overall minA to complete. Provided for HEP. Cont with current  POC.   12/06/23: Pt was seen for skilled ST services targeting cognitive-communication. SLP initiated education on Stop, Think, Plan, Do to reduce impulsivity. SLP used personally-relevant example re: before going to get the mail. Pt perseverated on him falling on the ice one time when attempting to get the mail, and required gentle prompting to return to subject. SLP provided pt with a few safety scenarios to see how he answered each question. Pt would benefit from more practice of this. Overall, pt did appear to have increased insight/awareness into deficits today. We discussed that even if you FEEL you can do something, we should always made safety a priority (I.e. using walker). Pt asked for walker before standing to leave in today's session.   11/29/23: Pt was seen for skilled ST services targeting continued evaluation. See above for CLQT and PROM scores. Pt continues to display decreased awareness of deficits, so SLP gently provided examples of error and discussed how we may fix them. Pt, wife, and SLP collaborated to identify goals for therapy. Goals  have been updated to reflect. Throughout today's session, pt was observed to have difficulty with auditory comprehension. He was unable to follow conversation. He was asked a question and would answer with something off topic. He did mention he has always had trouble with finishing tasks, though it has gotten worse.     PATIENT EDUCATION: Education details: SLP role in cognitive-communication Person educated: Patient and Spouse Education method: Explanation Education comprehension: verbalized understanding and needs further education   GOALS: Goals reviewed with patient? No  SHORT TERM GOALS: Target date: 12/23/23  Complete CLQT, PROM, and update goals Baseline: Goal status: MET  2.  Pt will identify the unsafe situation and provide 1 solution  Baseline:  Goal status: MET  3.  Pt will recall the following steps re: STOP PLAN THINK DO  with minA verbal cues Baseline:  Goal status:  NOT MET  4.  Pt will recall 2 external memory strategies independently.  Baseline:  Goal status: NOT MET    LONG TERM GOALS: Target date: 01/22/24  Pt/wife will improve score on PROM Baseline:  Goal status: INITIAL  2.  Pt will demonstrate improved safety awareness by acknowledging cognitive impairments Baseline:  Goal status: MET  3.  Pt will verbalize exact steps (meaning) of re: STOP THINK PLAN DO  Baseline:  Goal status: MET  4.  Wife/pt will report successful use of memory strategies at home/in community.  Baseline:  Goal status: MET     ASSESSMENT:  CLINICAL IMPRESSION: Pt is a 73 yo male who presents to ST OP for evaluation with complaints of cognitive changes post surgery. Pt was [facility] discharged [facility] in June. Pt and wife endorse re: forgetfulness, difficulty retaining information, difficulty with prospective memory, and safety/judgement. Pt was assessed using CLQT - to complete next session. SLP observed reduced topic maintenance and perseveration on his art. For example, SLP asked what he feels like he struggles with at home and pt never answered the question, but expressed his interest in [employer]. Pt appeared to have decreased insight. It was suggested that wife attention sessions to assist with carryover of strategy. SLP rec skilled ST services to address cognitive-communication impairment to facilitate return to home. .     OBJECTIVE IMPAIRMENTS: include attention, memory, awareness, and executive functioning. These impairments are limiting patient from managing medications, managing appointments, managing finances, household responsibilities, ADLs/IADLs, and effectively communicating at home and in community. Factors affecting potential to achieve goals and functional outcome are ability to learn/carryover information.. Patient will benefit from skilled SLP services to address above impairments and improve  overall function.  REHAB POTENTIAL: Good with wife support  PLAN:  SLP FREQUENCY: 1-2x/week  SLP DURATION: 8 weeks  PLANNED INTERVENTIONS: Environmental controls, Cueing hierachy, Cognitive reorganization, Internal/external aids, Functional tasks, Multimodal communication approach, SLP instruction and feedback, Compensatory strategies, Patient/family education, and [id number] Treatment of speech (30 or 45 min)     [employer], CCC-SLP 01/16/2024, 12:35 PM

## 2024-01-16 NOTE — Therapy (Signed)
 OUTPATIENT PHYSICAL THERAPY LOWER EXTREMITY TREATMENT   Patient Name: Andrew Tanner MRN: [id number] DOB:Oct 28, 1950, 73 y.o., male Today's Date: 01/16/2024  END OF SESSION:  PT End of Session - 01/16/24 1143     Visit Number 8    Date for Recertification  02/13/24    PT Start Time 1144    PT Stop Time 1224    PT Time Calculation (min) 40 min    Equipment Utilized During Treatment Gait belt;Other (comment)   rollator   Activity Tolerance Patient limited by fatigue;Patient tolerated treatment well    Behavior During Therapy [facility] for tasks assessed/performed               Past Medical History:  Diagnosis Date   Anxiety    Atrial myxoma    Basal cell carcinoma    Cancer (HCC)    basal cell skin CA   Cataract    Depression    Diabetes mellitus without complication (HCC)    Diabetic neuropathy (HCC)    Duodenal adenoma    Hyperlipidemia    Hypertension    IC (interstitial cystitis)    Neuromuscular disorder (HCC) 01/09/21   Peripheral  Neuropathy   Pancreatic lesion    Squamous cell carcinoma of skin    Past Surgical History:  Procedure Laterality Date   BIOPSY  12/05/2022   Procedure: BIOPSY;  Surgeon: Wilhelmenia Aloha Raddle., MD;  Location: [facility];  Service: Gastroenterology;;   CATARACT EXTRACTION, BILATERAL     ESOPHAGOGASTRODUODENOSCOPY N/A 12/05/2022   Procedure: ESOPHAGOGASTRODUODENOSCOPY (EGD);  Surgeon: Wilhelmenia Aloha Raddle., MD;  Location: [facility];  Service: Gastroenterology;  Laterality: N/A;   EUS N/A 12/05/2022   Procedure: UPPER ENDOSCOPIC ULTRASOUND (EUS) RADIAL;  Surgeon: Wilhelmenia Aloha Raddle., MD;  Location: [facility];  Service: Gastroenterology;  Laterality: N/A;   FINE NEEDLE ASPIRATION N/A 12/05/2022   Procedure: FINE NEEDLE ASPIRATION (FNA) LINEAR;  Surgeon: Wilhelmenia Aloha Raddle., MD;  Location: [facility];  Service: Gastroenterology;  Laterality: N/A;   HERNIA REPAIR N/A    inguinal   NECK SURGERY     ant/post   SPINE  SURGERY     SUBMUCOSAL TATTOO INJECTION  12/05/2022   Procedure: SUBMUCOSAL TATTOO INJECTION;  Surgeon: Wilhelmenia Aloha Raddle., MD;  Location: [facility];  Service: Gastroenterology;;   TOTAL HIP ARTHROPLASTY Left 07/13/2023   Procedure: ARTHROPLASTY, HIP, TOTAL, ANTERIOR APPROACH;  Surgeon: Kendal Franky SQUIBB, MD;  Location: [facility];  Service: Orthopedics;  Laterality: Left;  [employer] bipolar Hana table  hemi hip   TRANSURETHRAL RESECTION OF PROSTATE     TURP VAPORIZATION     WHIPPLE PROCEDURE N/A 04/27/2023   Procedure: WHIPPLE PROCEDURE;  Surgeon: Aron Shoulders, MD;  Location: [facility];  Service: General;  Laterality: N/A;   Patient Active Problem List   Diagnosis Date Noted   Atrial myxoma 10/31/2023   History of left hip replacement 08/28/2023   Fracture of femoral neck, left (HCC) 07/13/2023   Protein-calorie malnutrition, severe 05/17/2023   Vomiting and diarrhea 05/13/2023   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   Malnutrition of moderate degree 05/08/2023   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   Poorly controlled type 2 diabetes mellitus with circulatory disorder (HCC) 02/08/2023   Vitamin D  deficiency 07/25/2022   Pain in both feet 02/10/2022   Peripheral neuropathy 01/25/2022   Seborrheic dermatitis 01/25/2022   Positive colorectal cancer screening using Cologuard test 03/16/2020   Chronic RLQ pain 03/16/2020   Type 2  diabetes mellitus with hyperglycemia, without long-term current use of insulin  (HCC) 06/02/2011   Hypertension 06/02/2011   Hyperlipemia 06/02/2011   Cataract 06/02/2011    PCP: Jenkins Earnie Carrel  REFERRING PROVIDER: Jenkins Earnie Carrel  REFERRING DIAG:  [phone] (ICD-10-CM) - History of left hip replacement  E44.0 (ICD-10-CM) - Malnutrition of moderate degree (HCC)  G60.9 (ICD-10-CM) - Idiopathic peripheral neuropathy  R53.1 (ICD-10-CM) - Generalized weakness    THERAPY DIAG:  Other abnormalities of  gait and mobility  Repeated falls  Muscle weakness (generalized)  Rationale for Evaluation and Treatment: Rehabilitation  ONSET DATE: 07/12/23  SUBJECTIVE:   SUBJECTIVE STATEMENT:  Patient reports no falls since previous visit. Forgot his rollator at home so he has his RW today. States that he may try a new patch for his neuropathy that his doctor recommended.  PERTINENT HISTORY: 11/13/23--73 year old male with history of diabetes, neuropathy, hypertension, hyperlipidemia here for evaluation of memory, cognitive changes and generalized weakness.   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  He was admitted to the hospital for surgical treatment with Whipple procedure.  He was discharged home with couple weeks later.  He was readmitted on 05/13/2023 after increased diarrhea, weakness and falling down at home.  He was admitted for approximately 4 weeks with ongoing vomiting, diarrhea, protein calorie malnutrition, colitis and muscular deconditioning.  This time he was discharged to skilled nursing facility.   06/26/2023 patient fell down at nursing home, hit his head, and went to the emergency room.   07/12/2023 patient fell down again resulting in the left femoral neck fracture requiring admission and surgery. Overall patient has had significant difficulty returning to his baseline.  Wife notes that he is having some ongoing short-term memory difficulty, decreased insight, brain fog issues.  Also continues to be generally deconditioned and weak.  They live in a two-story home with the bedroom and bathroom upstairs, 14 steps, and this remains a challenge for patient to transition from skilled nursing facility back home.   Pt is a 73 y.o. male admitted via EMS 07/12/23 for fall at rehab facility. X-ray showed L femoral neck fx. L THA performed 4/3. 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 Yes: NPRS scale: little soreness/10 Pain location: general  Pain description: mm soreness after last PT session Aggravating factors: worse at end of day Relieving factors: meds, lying down, exercise   PRECAUTIONS: Fall  RED FLAGS: None   WEIGHT BEARING RESTRICTIONS: No  FALLS:  Has patient fallen in last 6 months? Yes. Number of falls no falls in the last 3 months, but at least 12 in the last 6 months   LIVING ENVIRONMENT: Lives with: lives with their spouse Lives in: House/apartment Stairs: Yes: Internal: 14 steps; on right going up and on left going up and External: 2 in garage and 2 to front door steps; can reach both and bilateral but cannot reach both Has following equipment at home: Single point cane and Walker - 2 wheeled  **currently living at a Assisted Living for now**  OCCUPATION: An Tree surgeon   PLOF: Independent, Independent with basic ADLs, and Independent with gait  PATIENT GOALS: to get more independent, get back to my home  NEXT MD VISIT:   OBJECTIVE:  Note: Objective measures were completed at Evaluation unless otherwise noted.  DIAGNOSTIC FINDINGS: Left  hip arthroplasty in expected alignment. No periprosthetic lucency or fracture. Recent postsurgical change includes air and edema in the soft tissues. Left hip arthroplasty without immediate postoperative complication.   COGNITION: Overall cognitive status: Within functional limits for tasks assessed     SENSATION: Neuropathy in B feet    POSTURE: rounded shoulders and forward head   LOWER EXTREMITY ROM: WNL   LOWER EXTREMITY MMT: 4+/5 BLE    FUNCTIONAL TESTS:  5 times sit to stand: 19s  Timed up and go (TUG): 22s  GAIT: Distance walked: in clinic distances Assistive device utilized: Environmental consultant - 2 wheeled and None Level of assistance: Complete Independence and  Modified independence Comments: able to complete TUG without walker, slow but is safe                                                                                                             TREATMENT DATE:  01/16/24 Nustep Lvl 5 for 8 minutes for cardiovascular endurance Sit>Stand x 10 with Weighted Medicine Ball (Red) **5xSTS = 19.47 sec with UE pushing through legs Lateral Step Downs x 8 each side Functional Reaching High to place objects low (mimic dishes and cleaning up cabinets) while on firm surface - progressed to standing on foam pad - able to perform with specific sequences with mild errors with self-correction (number order) Standing Lunge Stretch at the steps x 15 second holds on each leg 4x Standing HS Stretch x 10 second holds 2x each side  Vitals: 153/94, 98 bpm; 130/86 86 bpm  01/10/24  Nustep L5x8 minutes all four extremities seat 13   Gait inside and outside with his 4WW- much safer with 4WW than RW, needed min cues for curb navigation and speed going downhill but only because this was a new device to him. Basic education on 4WW lock management with transfers   Tandem stance solid surface 3x30 seconds close min guard  Side steps on blue foam pad in // bars x4 laps light minA Forward step ups 4 inch box on blue foam x10 B min guard  Standing marches on blue foam pad x20 alternating up to ModA due to anterior lean with this task  01/09/24 NuStep L 5 x 6 min 20lb resisted gait 4 way x 3 Alt 6 in box taps x 5 each  From airex x10 each 6in step ups forward and lateral 2x10 LOB x5 mod to max assist to correct  HS curls 35lb 2x12 Leg Ext 10lb 2x12 Sit to stand LE on airex 2x10  PATIENT EDUCATION:  Education details: POC, HEP, fall risk Person educated: Patient and Spouse Education method: Medical illustrator Education comprehension: verbalized understanding and returned demonstration  HOME EXERCISE PROGRAM: Access Code: [email or web address] URL:  [email or web address] Date: 11/21/2023 Prepared by: Almetta Fam  Exercises - Sit to Stand  - 1 x daily - 7 x weekly - 2 sets - 10 reps - Seated Long Arc Quad  - 1 x daily - 7 x weekly - 2 sets - 10 reps - Standing Hip Abduction with  Counter Support  - 1 x daily - 7 x weekly - 2 sets - 10 reps - Standing Hip Extension with Counter Support  - 1 x daily - 7 x weekly - 2 sets - 10 reps - Heel Raises with Counter Support  - 1 x daily - 7 x weekly - 2 sets - 10 reps  ASSESSMENT:  CLINICAL IMPRESSION:  Focused primarily on functional balance to help prepare for his transition to the new ALF in a couple weeks. Reiterated safety with Sit>Stands for stability and to feel for the chair on the back of his knees when going to sit. Endorsed mild lightheadedness after STS, so vitals were checked and WNL (see above). Continues to demonstrate impulsiveness with mobility and required cues for safety. Has mild difficulty with dual task activities and encouraged to talk out safety and steps he is planning to do before beginning a task. Would benefit from continued PT to address the high fall risk and safety in order to improve his functional independence.    EVAL: Patient is a 73 y.o. male who was seen today for physical therapy evaluation and treatment for weakness. He has an extensive medical history and multiple surgeries that has caused him to lose a lot of weight and muscle mass. His wife reports a number of falls since the beginning of the year but none in the past 3 months. Currently he is staying at an assisted living to work on ADLs and increasing his independence. He walks with a walker outside of his home. His wife reports he has a cane, but needs practice on how to use it. Pt reports he does some walking around the house without it. At their home, he has stairs up to the bedroom and bathrooms so would like to practice working on this. He does have neuropathy and reports high levels of pain at  times in both feet. Patient will benefit from PT to increase his strength and allow him to improve functional independence and mobility.   OBJECTIVE IMPAIRMENTS: Abnormal gait, decreased activity tolerance, decreased balance, decreased coordination, decreased endurance, difficulty walking, decreased strength, decreased safety awareness, and pain.   ACTIVITY LIMITATIONS: carrying, lifting, squatting, stairs, transfers, and locomotion level  PARTICIPATION LIMITATIONS: meal prep, cleaning, laundry, driving, shopping, community activity, and yard work  PERSONAL FACTORS: Fitness, Past/current experiences, Time since onset of injury/illness/exacerbation, and 3+ comorbidities: istory of diabetes, neuropathy, hypertension, hyperlipidemia here for evaluation of memory, cognitive changes and generalized weakness are also affecting patient's functional outcome.   REHAB POTENTIAL: Good  CLINICAL DECISION MAKING: Stable/uncomplicated  EVALUATION COMPLEXITY: Low  GOALS: Goals reviewed with patient? Yes  SHORT TERM GOALS: Target date: 01/02/24  Patient will be independent with initial HEP. Baseline:  Goal status: MET 12/13/23  2.  Patient will demonstrate improved functional LE strength as demonstrated by 5xSTS <15s. Baseline: 19s, 01/16/24 19 seconds Goal status: INITIAL  3.  Patient will be educated on strategies to decrease risk of falls.  Baseline:  Goal status: IN PROGRESS 12/13/23   LONG TERM GOALS: Target date: 02/13/24  Patient will be independent with advanced/ongoing HEP to improve outcomes and carryover.  Baseline:  Goal status: INITIAL  2.  Patient will be able to ambulate 500' without AD with good safety to access community.  Baseline: short distances without AD Goal status: INITIAL  3.  Patient will be able to navigate a set of stairs with reciprocal pattern for safety inside his home.  Baseline:  Goal status: INITIAL  4.  Patient will demonstrate decreased fall risk by  scoring < 14 sec on TUG. Baseline: 22s without walker Goal status: INITIAL  PLAN:  PT FREQUENCY: 2x/week  PT DURATION: 12 weeks  PLANNED INTERVENTIONS: 97110-Therapeutic exercises, 97530- Therapeutic activity, [id number]- Neuromuscular re-education, 97535- Self Care, [id number]- Manual therapy, [phone]- Gait training, [phone]- Electrical stimulation (unattended), 97016- Vasopneumatic device, 20560 (1-2 muscles), 20561 (3+ muscles)- Dry Needling, Patient/Family education, Balance training, Stair training, Joint mobilization, Joint manipulation, Spinal manipulation, Spinal mobilization, Cryotherapy, and Moist heat  PLAN FOR NEXT SESSION: LE strengthening, gait training with cane, functional tasks, continue working on balance. Potential transfer to ALF mid-October, not sure if he will get in house PT there or continue to come here? Continue working on sequencing with [id number], watch L brake as it is loose   Yassmin Binegar, PT, DPT 01/16/24 12:37 PM

## 2024-01-17 DIAGNOSIS — Z111 Encounter for screening for respiratory tuberculosis: Secondary | ICD-10-CM | POA: Diagnosis not present

## 2024-01-17 NOTE — Therapy (Signed)
 OUTPATIENT PHYSICAL THERAPY LOWER EXTREMITY TREATMENT   Patient Name: Dayton Kenley MRN: [id number] DOB:23-Jan-1951, 73 y.o., male Today's Date: 01/18/2024  END OF SESSION:  PT End of Session - 01/18/24 0929     Visit Number 9    Date for Recertification  02/13/24    PT Start Time 0930    PT Stop Time 1015    PT Time Calculation (min) 45 min    Equipment Utilized During Treatment Gait belt;Other (comment)   rollator   Activity Tolerance Patient limited by fatigue;Patient tolerated treatment well    Behavior During Therapy [facility] for tasks assessed/performed                Past Medical History:  Diagnosis Date   Anxiety    Atrial myxoma    Basal cell carcinoma    Cancer (HCC)    basal cell skin CA   Cataract    Depression    Diabetes mellitus without complication (HCC)    Diabetic neuropathy (HCC)    Duodenal adenoma    Hyperlipidemia    Hypertension    IC (interstitial cystitis)    Neuromuscular disorder (HCC) 01/09/21   Peripheral  Neuropathy   Pancreatic lesion    Squamous cell carcinoma of skin    Past Surgical History:  Procedure Laterality Date   BIOPSY  12/05/2022   Procedure: BIOPSY;  Surgeon: Wilhelmenia Aloha Raddle., MD;  Location: [facility];  Service: Gastroenterology;;   CATARACT EXTRACTION, BILATERAL     ESOPHAGOGASTRODUODENOSCOPY N/A 12/05/2022   Procedure: ESOPHAGOGASTRODUODENOSCOPY (EGD);  Surgeon: Wilhelmenia Aloha Raddle., MD;  Location: [facility];  Service: Gastroenterology;  Laterality: N/A;   EUS N/A 12/05/2022   Procedure: UPPER ENDOSCOPIC ULTRASOUND (EUS) RADIAL;  Surgeon: Wilhelmenia Aloha Raddle., MD;  Location: [facility];  Service: Gastroenterology;  Laterality: N/A;   FINE NEEDLE ASPIRATION N/A 12/05/2022   Procedure: FINE NEEDLE ASPIRATION (FNA) LINEAR;  Surgeon: Wilhelmenia Aloha Raddle., MD;  Location: [facility];  Service: Gastroenterology;  Laterality: N/A;   HERNIA REPAIR N/A    inguinal   NECK SURGERY     ant/post   SPINE  SURGERY     SUBMUCOSAL TATTOO INJECTION  12/05/2022   Procedure: SUBMUCOSAL TATTOO INJECTION;  Surgeon: Wilhelmenia Aloha Raddle., MD;  Location: [facility];  Service: Gastroenterology;;   TOTAL HIP ARTHROPLASTY Left 07/13/2023   Procedure: ARTHROPLASTY, HIP, TOTAL, ANTERIOR APPROACH;  Surgeon: Kendal Franky SQUIBB, MD;  Location: [facility];  Service: Orthopedics;  Laterality: Left;  [employer] bipolar Hana table  hemi hip   TRANSURETHRAL RESECTION OF PROSTATE     TURP VAPORIZATION     WHIPPLE PROCEDURE N/A 04/27/2023   Procedure: WHIPPLE PROCEDURE;  Surgeon: Aron Shoulders, MD;  Location[facility];  Service: General;  Laterality: N/A;   Patient Active Problem List   Diagnosis Date Noted   Atrial myxoma 10/31/2023   History of left hip replacement 08/28/2023   Fracture of femoral neck, left (HCC) 07/13/2023   Protein-calorie malnutrition, severe 05/17/2023   Vomiting and diarrhea 05/13/2023   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   Malnutrition of moderate degree 05/08/2023   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   Poorly controlled type 2 diabetes mellitus with circulatory disorder (HCC) 02/08/2023   Vitamin D  deficiency 07/25/2022   Pain in both feet 02/10/2022   Peripheral neuropathy 01/25/2022   Seborrheic dermatitis 01/25/2022   Positive colorectal cancer screening using Cologuard test 03/16/2020   Chronic RLQ pain 03/16/2020   Type  2 diabetes mellitus with hyperglycemia, without long-term current use of insulin  (HCC) 06/02/2011   Hypertension 06/02/2011   Hyperlipemia 06/02/2011   Cataract 06/02/2011    PCP: Jenkins Earnie Carrel  REFERRING PROVIDER: Jenkins Earnie Carrel  REFERRING DIAG:  [phone] (ICD-10-CM) - History of left hip replacement  E44.0 (ICD-10-CM) - Malnutrition of moderate degree (HCC)  G60.9 (ICD-10-CM) - Idiopathic peripheral neuropathy  R53.1 (ICD-10-CM) - Generalized weakness    THERAPY DIAG:  Other abnormalities of  gait and mobility  Muscle weakness (generalized)  Other lack of coordination  Rationale for Evaluation and Treatment: Rehabilitation  ONSET DATE: 07/12/23  SUBJECTIVE:   SUBJECTIVE STATEMENT:  I am doing fine. We are transitioning in to an assisted living Oct 15th.   PERTINENT HISTORY: 11/13/23--73 year old male with history of diabetes, neuropathy, hypertension, hyperlipidemia here for evaluation of memory, cognitive changes and generalized weakness.   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  He was admitted to the hospital for surgical treatment with Whipple procedure.  He was discharged home with couple weeks later.  He was readmitted on 05/13/2023 after increased diarrhea, weakness and falling down at home.  He was admitted for approximately 4 weeks with ongoing vomiting, diarrhea, protein calorie malnutrition, colitis and muscular deconditioning.  This time he was discharged to skilled nursing facility.   06/26/2023 patient fell down at nursing home, hit his head, and went to the emergency room.   07/12/2023 patient fell down again resulting in the left femoral neck fracture requiring admission and surgery. Overall patient has had significant difficulty returning to his baseline.  Wife notes that he is having some ongoing short-term memory difficulty, decreased insight, brain fog issues.  Also continues to be generally deconditioned and weak.  They live in a two-story home with the bedroom and bathroom upstairs, 14 steps, and this remains a challenge for patient to transition from skilled nursing facility back home.   Pt is a 72 y.o. male admitted via EMS 07/12/23 for fall at rehab facility. X-ray showed L femoral neck fx. L THA performed 4/3. 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,  basal cell carcinoma, diabetes mellitus, diabetic neuropathy, neuromuscular disorder, hyperlipidemia, malignant neuroendocrine tumor of duodenum    PAIN:  Are you having pain? Yes: NPRS scale: little soreness/10 Pain location: general  Pain description: mm soreness after last PT session Aggravating factors: worse at end of day Relieving factors: meds, lying down, exercise   PRECAUTIONS: Fall  RED FLAGS: None   WEIGHT BEARING RESTRICTIONS: No  FALLS:  Has patient fallen in last 6 months? Yes. Number of falls no falls in the last 3 months, but at least 12 in the last 6 months   LIVING ENVIRONMENT: Lives with: lives with their spouse Lives in: House/apartment Stairs: Yes: Internal: 14 steps; on right going up and on left going up and External: 2 in garage and 2 to front door steps; can reach both and bilateral but cannot reach both Has following equipment at home: Single point cane and Walker - 2 wheeled  **currently living at a Assisted Living for now**  OCCUPATION: An Tree surgeon   PLOF: Independent, Independent with basic ADLs, and Independent with gait  PATIENT GOALS: to get more independent, get back to my home  NEXT MD VISIT:   OBJECTIVE:  Note: Objective measures were completed at Evaluation unless otherwise noted.  DIAGNOSTIC FINDINGS: Left hip arthroplasty in expected alignment. No periprosthetic lucency or fracture. Recent postsurgical change includes air  and edema in the soft tissues. Left hip arthroplasty without immediate postoperative complication.   COGNITION: Overall cognitive status: Within functional limits for tasks assessed     SENSATION: Neuropathy in B feet    POSTURE: rounded shoulders and forward head   LOWER EXTREMITY ROM: WNL   LOWER EXTREMITY MMT: 4+/5 BLE    FUNCTIONAL TESTS:  5 times sit to stand: 19s  Timed up and go (TUG): 22s  GAIT: Distance walked: in clinic distances Assistive device utilized: Environmental consultant - 2 wheeled and None Level of assistance: Complete Independence and Modified independence Comments: able to complete TUG without walker, slow but is safe                                                                                                              TREATMENT DATE: 01/18/24 NuStep L5x38mins Recheck goals -TUG 13.91s w/o walker  -stairs 2HRA Walking on beam Walking with cane 2 laps  Leg ext 10# 2x10 HS curls 25# 2x10 STS holding yellow ball 2x10  01/16/24 Nustep Lvl 5 for 8 minutes for cardiovascular endurance Sit>Stand x 10 with Weighted Medicine Ball (Red) **5xSTS = 19.47 sec with UE pushing through legs Lateral Step Downs x 8 each side Functional Reaching High to place objects low (mimic dishes and cleaning up cabinets) while on firm surface - progressed to standing on foam pad - able to perform with specific sequences with mild errors with self-correction (number order) Standing Lunge Stretch at the steps x 15 second holds on each leg 4x Standing HS Stretch x 10 second holds 2x each side  Vitals: 153/94, 98 bpm; 130/86 86 bpm  01/10/24  Nustep L5x8 minutes all four extremities seat 13   Gait inside and outside with his 4WW- much safer with 4WW than RW, needed min cues for curb navigation and speed going downhill but only because this was a new device to him. Basic education on 4WW lock management with transfers   Tandem stance solid surface 3x30 seconds close min guard  Side steps on blue foam pad in // bars x4 laps light minA Forward step ups 4 inch box on blue foam x10 B min guard  Standing marches on blue foam pad x20 alternating up to ModA due to anterior lean with this task  01/09/24 NuStep L 5 x 6 min 20lb resisted gait 4 way x 3 Alt 6 in box taps x 5 each  From airex x10 each 6in step ups forward and lateral 2x10 LOB x5 mod to max assist to correct  HS curls 35lb 2x12 Leg Ext 10lb 2x12 Sit to stand LE on airex 2x10  PATIENT EDUCATION:  Education details: POC, HEP, fall risk Person educated: Patient and Spouse Education method: Medical illustrator Education comprehension: verbalized understanding and  returned demonstration  HOME EXERCISE PROGRAM: Access Code: [email or web address] URL: [email or web address] Date: 11/21/2023 Prepared by: Almetta Fam  Exercises - Sit to Stand  - 1 x daily - 7 x weekly - 2 sets - 10 reps - Seated Long Arc Quad  -  1 x daily - 7 x weekly - 2 sets - 10 reps - Standing Hip Abduction with Counter Support  - 1 x daily - 7 x weekly - 2 sets - 10 reps - Standing Hip Extension with Counter Support  - 1 x daily - 7 x weekly - 2 sets - 10 reps - Heel Raises with Counter Support  - 1 x daily - 7 x weekly - 2 sets - 10 reps  ASSESSMENT:  CLINICAL IMPRESSION: Focused primarily on functional balance to help prepare for his transition to the new ALF next week.  Patient feels this will be good for him to keep working on strength and balance. He has met TUG goal and was able to do steps in clinic up and down 2x with 2HRA and alternating pattern. Continues to demonstrate impulsiveness with mobility and required cues for safety. Sometimes has trouble following direction. Would benefit from continued PT to address the high fall risk and safety in order to improve his functional independence.    EVAL: Patient is a 73 y.o. male who was seen today for physical therapy evaluation and treatment for weakness. He has an extensive medical history and multiple surgeries that has caused him to lose a lot of weight and muscle mass. His wife reports a number of falls since the beginning of the year but none in the past 3 months. Currently he is staying at an assisted living to work on ADLs and increasing his independence. He walks with a walker outside of his home. His wife reports he has a cane, but needs practice on how to use it. Pt reports he does some walking around the house without it. At their home, he has stairs up to the bedroom and bathrooms so would like to practice working on this. He does have neuropathy and reports high levels of pain at times in both feet. Patient will  benefit from PT to increase his strength and allow him to improve functional independence and mobility.   OBJECTIVE IMPAIRMENTS: Abnormal gait, decreased activity tolerance, decreased balance, decreased coordination, decreased endurance, difficulty walking, decreased strength, decreased safety awareness, and pain.   ACTIVITY LIMITATIONS: carrying, lifting, squatting, stairs, transfers, and locomotion level  PARTICIPATION LIMITATIONS: meal prep, cleaning, laundry, driving, shopping, community activity, and yard work  PERSONAL FACTORS: Fitness, Past/current experiences, Time since onset of injury/illness/exacerbation, and 3+ comorbidities: istory of diabetes, neuropathy, hypertension, hyperlipidemia here for evaluation of memory, cognitive changes and generalized weakness are also affecting patient's functional outcome.   REHAB POTENTIAL: Good  CLINICAL DECISION MAKING: Stable/uncomplicated  EVALUATION COMPLEXITY: Low  GOALS: Goals reviewed with patient? Yes  SHORT TERM GOALS: Target date: 01/02/24  Patient will be independent with initial HEP. Baseline:  Goal status: MET 12/13/23  2.  Patient will demonstrate improved functional LE strength as demonstrated by 5xSTS <15s. Baseline: 19s,  Goal status: ongoing 01/16/24 19s  3.  Patient will be educated on strategies to decrease risk of falls.  Baseline:  Goal status: IN PROGRESS 12/13/23, MET 01/18/24   LONG TERM GOALS: Target date: 02/13/24  Patient will be independent with advanced/ongoing HEP to improve outcomes and carryover.  Baseline:  Goal status: INITIAL  2.  Patient will be able to ambulate 500' without AD with good safety to access community.  Baseline: short distances without AD Goal status: INITIAL  3.  Patient will be able to navigate a set of stairs with reciprocal pattern for safety inside his home.  Baseline:  Goal status: IN PROGRESS  reciprocal with 2HRA    4.  Patient will demonstrate decreased fall risk by  scoring < 14 sec on TUG. Baseline: 22s without walker Goal status: 13.91s MET 01/18/24  PLAN:  PT FREQUENCY: 2x/week  PT DURATION: 12 weeks  PLANNED INTERVENTIONS: 97110-Therapeutic exercises, 97530- Therapeutic activity, 97112- Neuromuscular re-education, 97535- Self Care, [id number]- Manual therapy, [phone]- Gait training, [phone]- Electrical stimulation (unattended), 97016- Vasopneumatic device, 20560 (1-2 muscles), 20561 (3+ muscles)- Dry Needling, Patient/Family education, Balance training, Stair training, Joint mobilization, Joint manipulation, Spinal manipulation, Spinal mobilization, Cryotherapy, and Moist heat  PLAN FOR NEXT SESSION: LE strengthening, gait training with cane, functional tasks, continue working on balance. Last visit before leaving for ALF. 10th visit PN and D/C  Almetta Fam, PT, DPT 01/18/24 10:11 AM

## 2024-01-18 ENCOUNTER — Ambulatory Visit

## 2024-01-18 ENCOUNTER — Encounter: Payer: Self-pay | Admitting: Family Medicine

## 2024-01-18 DIAGNOSIS — R296 Repeated falls: Secondary | ICD-10-CM | POA: Diagnosis not present

## 2024-01-18 DIAGNOSIS — R2689 Other abnormalities of gait and mobility: Secondary | ICD-10-CM

## 2024-01-18 DIAGNOSIS — R350 Frequency of micturition: Secondary | ICD-10-CM | POA: Diagnosis not present

## 2024-01-18 DIAGNOSIS — M6281 Muscle weakness (generalized): Secondary | ICD-10-CM

## 2024-01-18 DIAGNOSIS — R278 Other lack of coordination: Secondary | ICD-10-CM

## 2024-01-18 DIAGNOSIS — R41844 Frontal lobe and executive function deficit: Secondary | ICD-10-CM | POA: Diagnosis not present

## 2024-01-18 DIAGNOSIS — R41841 Cognitive communication deficit: Secondary | ICD-10-CM | POA: Diagnosis not present

## 2024-01-18 DIAGNOSIS — R41840 Attention and concentration deficit: Secondary | ICD-10-CM | POA: Diagnosis not present

## 2024-01-19 ENCOUNTER — Ambulatory Visit

## 2024-01-19 DIAGNOSIS — C7A8 Other malignant neuroendocrine tumors: Secondary | ICD-10-CM | POA: Diagnosis not present

## 2024-01-19 DIAGNOSIS — E1140 Type 2 diabetes mellitus with diabetic neuropathy, unspecified: Secondary | ICD-10-CM | POA: Diagnosis not present

## 2024-01-19 DIAGNOSIS — Z111 Encounter for screening for respiratory tuberculosis: Secondary | ICD-10-CM | POA: Diagnosis not present

## 2024-01-19 DIAGNOSIS — G709 Myoneural disorder, unspecified: Secondary | ICD-10-CM | POA: Diagnosis not present

## 2024-01-19 NOTE — Telephone Encounter (Signed)
 FL2, Standing Physician order,Physician Order for Pt/OT eval, Diet order form and Medical history form on desk for review. Please advise if ok to complete and return via fax. Per paperwork pt will need TB skin test completed within 30 days before entry to [facility] and not showing any in chart for patient in past 30 days.

## 2024-01-19 NOTE — Telephone Encounter (Signed)
 Please see pt wife message regarding FL2 forms for [facility] on desk.

## 2024-01-22 ENCOUNTER — Telehealth: Payer: Self-pay

## 2024-01-22 NOTE — Telephone Encounter (Signed)
 Copied from CRM [id number]. Topic: Clinical - Medical Advice >> Jan 22, 2024  8:13 AM Anairis L wrote: Reason for CRM: [name] calling back from [facility]. [phone] she will be available until 10 am, after that she will be in meeting until 2:30 pm.  Thank you.  Message received. Will call back after 2:30pm.

## 2024-01-23 ENCOUNTER — Ambulatory Visit

## 2024-01-23 ENCOUNTER — Ambulatory Visit: Admitting: Occupational Therapy

## 2024-01-23 ENCOUNTER — Ambulatory Visit: Admitting: Speech Pathology

## 2024-01-23 ENCOUNTER — Telehealth: Payer: Self-pay

## 2024-01-23 DIAGNOSIS — R2689 Other abnormalities of gait and mobility: Secondary | ICD-10-CM

## 2024-01-23 DIAGNOSIS — R41840 Attention and concentration deficit: Secondary | ICD-10-CM

## 2024-01-23 DIAGNOSIS — M6281 Muscle weakness (generalized): Secondary | ICD-10-CM

## 2024-01-23 DIAGNOSIS — R278 Other lack of coordination: Secondary | ICD-10-CM

## 2024-01-23 DIAGNOSIS — R41844 Frontal lobe and executive function deficit: Secondary | ICD-10-CM

## 2024-01-23 DIAGNOSIS — R41841 Cognitive communication deficit: Secondary | ICD-10-CM | POA: Diagnosis not present

## 2024-01-23 DIAGNOSIS — R350 Frequency of micturition: Secondary | ICD-10-CM | POA: Diagnosis not present

## 2024-01-23 DIAGNOSIS — R296 Repeated falls: Secondary | ICD-10-CM | POA: Diagnosis not present

## 2024-01-23 NOTE — Therapy (Unsigned)
 OUTPATIENT OCCUPATIONAL THERAPY NEURO treatment  Patient Name: Andrew Tanner MRN: [id number] DOB:12-07-1950, 73 y.o., male Today's Date: 01/24/2024  PCP: Dr. Adaline  REFERRING PROVIDER: Dr. Adaline OCCUPATIONAL THERAPY DISCHARGE SUMMARY   Current functional level related to goals / functional outcomes: Pt met 2/5 long term goals. He did not fully achieve all long term goals due to early d/c. Pt is trnasitioning his care to ALF.   Remaining deficits: cognitve deficits, decreased coordination, decreased balance   Education / Equipment: Pt/ wife were instructed in cognitive compensations and recommendations for safety with ADLs/IADLs. They verbalize understanding.   Patient agrees to discharge. Patient goals were partially met. Patient is being discharged due to the patient's request. Pt is transitioning to ALF and will receive therapies there.    END OF SESSION:  OT End of Session - 01/23/24 1319     Visit Number 9    Number of Visits 25    Date for Recertification  02/07/24    Authorization Type [employer] Medicare    Authorization - Visit Number 9    Progress Note Due on Visit 10    OT Start Time 1318    OT Stop Time 1356    OT Time Calculation (min) 38 min    Activity Tolerance Patient tolerated treatment well    Behavior During Therapy WFL for tasks assessed/performed            Past Medical History:  Diagnosis Date   Anxiety    Atrial myxoma    Basal cell carcinoma    Cancer (HCC)    basal cell skin CA   Cataract    Depression    Diabetes mellitus without complication (HCC)    Diabetic neuropathy (HCC)    Duodenal adenoma    Hyperlipidemia    Hypertension    IC (interstitial cystitis)    Neuromuscular disorder (HCC) 01/09/21   Peripheral  Neuropathy   Pancreatic lesion    Squamous cell carcinoma of skin    Past Surgical History:  Procedure Laterality Date   BIOPSY  12/05/2022   Procedure: BIOPSY;  Surgeon: Wilhelmenia Aloha Raddle., MD;  Location: [facility];  Service: Gastroenterology;;   CATARACT EXTRACTION, BILATERAL     ESOPHAGOGASTRODUODENOSCOPY N/A 12/05/2022   Procedure: ESOPHAGOGASTRODUODENOSCOPY (EGD);  Surgeon: Wilhelmenia Aloha Raddle., MD;  Location: [facility];  Service: Gastroenterology;  Laterality: N/A;   EUS N/A 12/05/2022   Procedure: UPPER ENDOSCOPIC ULTRASOUND (EUS) RADIAL;  Surgeon: Wilhelmenia Aloha Raddle., MD;  Location: [facility];  Service: Gastroenterology;  Laterality: N/A;   FINE NEEDLE ASPIRATION N/A 12/05/2022   Procedure: FINE NEEDLE ASPIRATION (FNA) LINEAR;  Surgeon: Wilhelmenia Aloha Raddle., MD;  Location: [facility];  Service: Gastroenterology;  Laterality: N/A;   HERNIA REPAIR N/A    inguinal   NECK SURGERY     ant/post   SPINE SURGERY     SUBMUCOSAL TATTOO INJECTION  12/05/2022   Procedure: SUBMUCOSAL TATTOO INJECTION;  Surgeon: Wilhelmenia Aloha Raddle., MD;  Location: [facility];  Service: Gastroenterology;;   TOTAL HIP ARTHROPLASTY Left 07/13/2023   Procedure: ARTHROPLASTY, HIP, TOTAL, ANTERIOR APPROACH;  Surgeon: Kendal Franky SQUIBB, MD;  Location: [facility];  Service: Orthopedics;  Laterality: Left;  [employer] bipolar Hana table  hemi hip   TRANSURETHRAL RESECTION OF PROSTATE     TURP VAPORIZATION     WHIPPLE PROCEDURE N/A 04/27/2023   Procedure: WHIPPLE PROCEDURE;  Surgeon: Aron Shoulders, MD;  Location: [facility];  Service: General;  Laterality: N/A;   Patient Active Problem  List   Diagnosis Date Noted   Atrial myxoma 10/31/2023   History of left hip replacement 08/28/2023   Fracture of femoral neck, left (HCC) 07/13/2023   Protein-calorie malnutrition, severe 05/17/2023   Vomiting and diarrhea 05/13/2023   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   Malnutrition of moderate degree 05/08/2023   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   Poorly controlled type 2 diabetes mellitus with circulatory disorder (HCC) 02/08/2023   Vitamin D  deficiency  07/25/2022   Pain in both feet 02/10/2022   Peripheral neuropathy 01/25/2022   Seborrheic dermatitis 01/25/2022   Positive colorectal cancer screening using Cologuard test 03/16/2020   Chronic RLQ pain 03/16/2020   Type 2 diabetes mellitus with hyperglycemia, without long-term current use of insulin  (HCC) 06/02/2011   Hypertension 06/02/2011   Hyperlipemia 06/02/2011   Cataract 06/02/2011    ONSET DATE: referral 10/02/23  REFERRING DIAG: Generalized weakness, Idiopathy peripheral neuropathy  L hip replacement   THERAPY DIAG:  Other abnormalities of gait and mobility  Muscle weakness (generalized)  Other lack of coordination  Frontal lobe and executive function deficit  Attention and concentration deficit  Rationale for Evaluation and Treatment: Rehabilitation  SUBJECTIVE:   SUBJECTIVE STATEMENT: Pt reports he is moving to ALF  this week Pt accompanied by: significant other  PERTINENT HISTORY:Pt is a 73 y.o. male admitted via EMS 07/12/23 for fall at rehab facility. X-ray showed L femoral neck fx. L THA performed 4/3. WBAT PMH: Whipple's procedure in January,  basal cell carcinoma, diabetes mellitus, diabetic neuropathy, neuromuscular disorder, hyperlipidemia, malignant neuroendocrine tumor of duodenum  PRECAUTIONS: Fall, need to clarify any restrictions related to hip replacement  WEIGHT BEARING RESTRICTIONS: Yes WBAT  PAIN: no reports of pain   FALLS: Has patient fallen in last 6 months? Yes. Number of falls multiple  LIVING ENVIRONMENT: Lives with: lives with their family and lives in an adult home, pt is staying in a home with personal care assistance since he can't climb stairs at home Lives in: House/apartment Stairs: No Has following equipment at home: Walker - 4 wheeled and shower chair  PLOF: Independent  PATIENT GOALS: improve independence, move back home  OBJECTIVE:  Note: Objective measures were completed at Evaluation unless otherwise  noted.  HAND DOMINANCE: Right  ADLs:has assist for starting shower, pt performs  with supervision Overall ADLs: increased time required Transfers/ambulation related to ADLs: Eating: mod I  UB Dressing: supervision LB Dressing: supervision Toileting: mod I with walker Bathing: supervision Tub Shower transfers: has tub seat, min A Equipment: Shower seat with back  IADLs:Dependent for IADLs  Handwriting: 100% legible  MOBILITY STATUS: supervision     ACTIVITY TOLERANCE: Activity tolerance: decreased overall endurance   UPPER EXTREMITY ROM:  A/ROM WFLs    UPPER EXTREMITY MMT:     MMT Right eval Left eval  Shoulder flexion 4-/5 4-/5  Shoulder abduction    Shoulder adduction    Shoulder extension    Shoulder internal rotation    Shoulder external rotation    Middle trapezius    Lower trapezius    Elbow flexion 4/5 4/5  Elbow extension 4/5 4/5  Wrist flexion    Wrist extension    Wrist ulnar deviation    Wrist radial deviation    Wrist pronation    Wrist supination    (Blank rows = not tested)  HAND FUNCTION: Grip strength: Right: 75 lbs; Left: 70 lbs  COORDINATION: 9 Hole Peg test: Right: 36.84 sec;  Left: 35.46 sec, tremors and increased difficulty  SENSATION: Not tested    COGNITION: Overall cognitive status: decreased short term memory, correctly completes trail making A, unable to correctly complete trail making B- decreased alternating attention    VISION ASSESSMENT:NT, wears glasses Pt  was noted to bump into door frame x 2 with walker, vision to be further assessed within a functional context.     OBSERVATIONS:  11/15/23-Plesant gentleman with complex medical history accompanied by his wife. Pt is and artist.Pt is living in an adult care home since he can't navigate the stairs at home.                                                                                                                             TREATMENT DATE:01/23/24- Pt  perfromed organization task to write out steps involved in hand washing dishes, only minimal recommendations for more thorough list.  Environmental scanning 13/14 items located inital pass during ambulation, pt located remaining itme with increased time. Therapist discussed the transition with pt/ wife and wrote out a some information/ recommendations for therapist's at new location. Therapist checked progress towards goals.  Pt/ wife agree with d/c   01/16/24 UBE x 6 mins level 3 for conditioning Pt will move to ALF next week. Organization task to write out items needed to paint and clean a new apartment. Pt was able to generate a comprehensive list without v.c. indpendently Constant therapy: Tassk for alternating attention, Alternating words  level 2- 86% Alternating symbols level 4 -97% discussion with patient regarding perfromance, importance of slowing down and using an organized scan pattern. Therapist discussed how this would translate into daily activities and improtance of using these same strategies at ALF. Discussion with pt / wife regarding plans to d/c next visit.  01/09/24 Pt/ wife have decided to transition pt to ALF in October discussed the transition. Pt may benefit from walker tray or use of rollator  to carry items at new location. Pt wrote out several safety recommendations for transition. Ambulating while locating items in a busy environment with focus on safety and organized scanning 12/15 items located first pass, min v.c to locate remainder. Therapist recommends pt looks for items in a store for home work  UBE x 6 mins level 3 for conditioning   01/03/24- Discussion with pt/ wife regarding pt's potential transition to ALF. Pt is adamantly opposed and wants to go home. Pt needs to be more I and able to climb a flight of stairs mod I. Pt was encouraged to consider the ALF as he is currently living in a group home. He would have better socialization, meals for DM and access  to therapy on site. Reviewed memory strategies that pt is using. Funational dynamic activities in standing: weightbearing through bilateral UE's rocking forwards and backwards then countetop pushups for management of tremors. copying small peg design on vertical surface with left and right UE's mod-max diffity due to tremors. Pt  stood for 10 mins total prior to rest, and he transitioned to working on Estate agent on countertop and table  with min-mod difficulty and v.c for compensation of tremors.   12/26/23 UBE x 6 mins level 3 for conditioning  Pt reports he has washed 3 loads of clothing since last visit and has made his bed initally with supervision. Pt generated a list of steps involved in making an egg sandwich. Therapist provided recommendations. Pt initally only wrote out the steps involved in making an egg, min-mod v.c to add additional details and complete task. Therapist recommended pt gathers all items prior to starting task. Therpist discussed safety with task and need for supervision initally. Discussed strategies for thoroughness with bathing and using a system with pt/ wife. Pt's wife to discuss with caregiver. Reveiwed red theraband HEP, mod v.c 10-15 reps each.   12/20/23- Discussed activities pt can begin performing to maximize I with ADLs/IADLs. (Pt has been very focused on his artwork and is doing well with the artwork). Pt to start performing laundry at his group home and possibly simple cooking with supervision. Pt simulated carrying pillowcase full of dirty laundry to and from target and unloading with supervision. Pt wrote out steps involved in performing laundry to organize task. Pt wrote out steps however it was initally for what he would do in his home.Pt is currently living in a group home and situation would be different. Pt then wrote out steps he would need to do to perform task at group home. Pt omitted several steps and required mod v.c to add aditional steps. Pt to write out  organized list for home work.  Standing at countertop to perfrom dynamic functional reaching with left and right UE's with close supervision and min v.c  12/13/23- memory/ cognitve tips education, see pt instructions Pt/ wife were instructed in red theraband HEP, min-mod v.c and demonstration, 10-15 reps each bilateral UE's  12/06/23- Pt/ wife were instructed in HEP for coordination, see pt instructions. Pt practiced printing using foam grip and foam grip was issued for handwriting. Pt demonstrates improved printing with v.c to slow down and form letters deliberately. Pt was cued to rest arms on table for improving tremors. Pt was instructed in flicks. 11/15/23- eval         PATIENT EDUCATION: Education details:  see above Person educated: Patient and Spouse Education method: Explanation, demonstration, v.c,  Education comprehension: verbalized understanding, returned demonstration,  HOME EXERCISE PROGRAM: coordiantion, writing 12/13/23- red t band, memory/ cognitve strategies   GOALS: Goals reviewed with patient? Yes  SHORT TERM GOALS: Target date: 12/16/23  I with inital HEP.  Goal status: met 01/23/24  2.  Pt will verbalize understanding of memory compensations.  Goal status: met., 01/03/24  3.  Pt will perform an alternating attention task with 80% accuracy for increased ease with IADLs.  Goal status:  met 86%, 95% today with alternating attention tasks 01/16/24  4.  Pt will perfrom dynamic activities in standing x 10 mins without LOB for increased ease with ADLs/IADLs.  Goal status:  met, Pt stood for 10 mins today  5.  Pt will perform an organization/ scheduling task with 90% or better accuracy (plan menu/ grocery list, organizing your day task)  Goal status:  met, pt was able to generate a list of items for moving into a new apartment without v.c today 01/16/24   LONG TERM GOALS: Target date: 02/07/24  I with updated HEP.  Goal status: met, 01/23/24- pt verbalizes  understanding of theraband exercises.  2.  Pt will perform simple snack or sandwich prep mod I.  Goal status:not met 01/23/24  3.  Pt will perform simple IADL tasks such as folding laundry and washing dishes swith supervision.  Goal status: partially met, Pt is performing laundry with distant supervision. 01/16/24  4.  Pt will navigate a busy environment without bumping into items and will locate items with 90% or better accuracy. Baseline:  Goal status: met 13/14, 93% 01/23/24  5.  Pt will demonstrate improved alternating attention as evidenced by completing trailmaking B correctly in 3 mins or less.  Goal status:  not met 1 error, 2 mins 23 secs 01/23/24    ASSESSMENT:  CLINICAL IMPRESSION: Pt is progressing towards goals.Pt demonstrates improved organization and good overall porgress towards goals. Pt is transitioning to ALF this week and therefore will be d/c .PERFORMANCE DEFICITS: in functional skills including ADLs, IADLs, coordination, dexterity, strength, pain, flexibility, Fine motor control, Gross motor control, mobility, balance, endurance, decreased knowledge of precautions, decreased knowledge of use of DME, vision, and UE functional use, cognitive skills including attention, memory, problem solving, safety awareness, sequencing, and temperament/personality, and psychosocial skills including coping strategies, environmental adaptation, habits, interpersonal interactions, and routines and behaviors.   IMPAIRMENTS: are limiting patient from ADLs, IADLs, work, play, leisure, and social participation.   CO-MORBIDITIES: may have co-morbidities  that affects occupational performance. Patient will benefit from skilled OT to address above impairments and improve overall function.  MODIFICATION OR ASSISTANCE TO COMPLETE EVALUATION: No modification of tasks or assist necessary to complete an evaluation.  OT OCCUPATIONAL PROFILE AND HISTORY: Detailed assessment: Review of records and  additional review of physical, cognitive, psychosocial history related to current functional performance.  CLINICAL DECISION MAKING: LOW - limited treatment options, no task modification necessary  REHAB POTENTIAL: Good  EVALUATION COMPLEXITY: Low    PLAN:  OT FREQUENCY: 1-2x/week  OT DURATION: 12 weeks  PLANNED INTERVENTIONS: 97168 OT Re-evaluation, 97535 self care/ADL training, 02889 therapeutic exercise, 97530 therapeutic activity, 97112 neuromuscular re-education, 97140 manual therapy, 97035 ultrasound, 97018 paraffin, 02989 moist heat, 97010 cryotherapy, 97129 Cognitive training (first 15 min), 02869 Cognitive training(each additional 15 min), balance training, functional mobility training, visual/perceptual remediation/compensation, psychosocial skills training, energy conservation, coping strategies training, patient/family education, and DME and/or AE instructions  RECOMMENDED OTHER SERVICES: PT, ST  CONSULTED AND AGREED WITH PLAN OF CARE: Patient and family member/caregiver  PLAN FOR NEXT SESSION: d/c OT   Eligio Angert, OT 01/24/2024, 4:33 PM

## 2024-01-23 NOTE — Patient Instructions (Signed)
 Andrew Tanner has made good overall progress. He continues to demonstrate challenges with alternating attention and organization however these have improved. He can benefit from practice and safety with navigating a new and busy environment.  He can benefit from the following:   Practice with shower transfers and daily ADL to increase I  Practice with reheating food safely using rollator for transport  Practice with washing dishes in sink  Practice with making his bed and putting away clothing to keep room tidy   [employer], OTR/L

## 2024-01-23 NOTE — Therapy (Signed)
 OUTPATIENT PHYSICAL THERAPY LOWER EXTREMITY TREATMENT Progress Note Reporting Period 11/21/23 to 01/23/24  See note below for Objective Data and Assessment of Progress/Goals.      Patient Name: Andrew Tanner MRN: [id number] DOB:1951/04/10, 73 y.o., male Today's Date: 01/23/2024  END OF SESSION:  PT End of Session - 01/23/24 1139     Visit Number 10    Date for Recertification  02/13/24    PT Start Time 1145    PT Stop Time 1230    PT Time Calculation (min) 45 min    Equipment Utilized During Treatment Gait belt;Other (comment)   rollator   Activity Tolerance Patient limited by fatigue;Patient tolerated treatment well    Behavior During Therapy [facility] for tasks assessed/performed                 Past Medical History:  Diagnosis Date   Anxiety    Atrial myxoma    Basal cell carcinoma    Cancer (HCC)    basal cell skin CA   Cataract    Depression    Diabetes mellitus without complication (HCC)    Diabetic neuropathy (HCC)    Duodenal adenoma    Hyperlipidemia    Hypertension    IC (interstitial cystitis)    Neuromuscular disorder (HCC) 01/09/21   Peripheral  Neuropathy   Pancreatic lesion    Squamous cell carcinoma of skin    Past Surgical History:  Procedure Laterality Date   BIOPSY  12/05/2022   Procedure: BIOPSY;  Surgeon: Wilhelmenia Aloha Raddle., MD;  Location: [facility];  Service: Gastroenterology;;   CATARACT EXTRACTION, BILATERAL     ESOPHAGOGASTRODUODENOSCOPY N/A 12/05/2022   Procedure: ESOPHAGOGASTRODUODENOSCOPY (EGD);  Surgeon: Wilhelmenia Aloha Raddle., MD;  Location: [facility];  Service: Gastroenterology;  Laterality: N/A;   EUS N/A 12/05/2022   Procedure: UPPER ENDOSCOPIC ULTRASOUND (EUS) RADIAL;  Surgeon: Wilhelmenia Aloha Raddle., MD;  Location: [facility];  Service: Gastroenterology;  Laterality: N/A;   FINE NEEDLE ASPIRATION N/A 12/05/2022   Procedure: FINE NEEDLE ASPIRATION (FNA) LINEAR;  Surgeon: Wilhelmenia Aloha Raddle., MD;  Location:  [facility];  Service: Gastroenterology;  Laterality: N/A;   HERNIA REPAIR N/A    inguinal   NECK SURGERY     ant/post   SPINE SURGERY     SUBMUCOSAL TATTOO INJECTION  12/05/2022   Procedure: SUBMUCOSAL TATTOO INJECTION;  Surgeon: Wilhelmenia Aloha Raddle., MD;  Location: [facility];  Service: Gastroenterology;;   TOTAL HIP ARTHROPLASTY Left 07/13/2023   Procedure: ARTHROPLASTY, HIP, TOTAL, ANTERIOR APPROACH;  Surgeon: Kendal Franky SQUIBB, MD;  Location: [facility];  Service: Orthopedics;  Laterality: Left;  [employer] bipolar Hana table  hemi hip   TRANSURETHRAL RESECTION OF PROSTATE     TURP VAPORIZATION     WHIPPLE PROCEDURE N/A 04/27/2023   Procedure: WHIPPLE PROCEDURE;  Surgeon: Aron Shoulders, MD;  [facility];  Service: General;  Laterality: N/A;   Patient Active Problem List   Diagnosis Date Noted   Atrial myxoma 10/31/2023   History of left hip replacement 08/28/2023   Fracture of femoral neck, left (HCC) 07/13/2023   Protein-calorie malnutrition, severe 05/17/2023   Vomiting and diarrhea 05/13/2023   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   Malnutrition of moderate degree 05/08/2023   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   Poorly controlled type 2 diabetes mellitus with circulatory disorder (HCC) 02/08/2023   Vitamin D  deficiency 07/25/2022   Pain in both feet 02/10/2022   Peripheral neuropathy 01/25/2022  Seborrheic dermatitis 01/25/2022   Positive colorectal cancer screening using Cologuard test 03/16/2020   Chronic RLQ pain 03/16/2020   Type 2 diabetes mellitus with hyperglycemia, without long-term current use of insulin  (HCC) 06/02/2011   Hypertension 06/02/2011   Hyperlipemia 06/02/2011   Cataract 06/02/2011    PCP: Jenkins Earnie Carrel  REFERRING PROVIDER: Jenkins Earnie Carrel  REFERRING DIAG:  [phone] (ICD-10-CM) - History of left hip replacement  E44.0 (ICD-10-CM) - Malnutrition of moderate degree (HCC)  G60.9  (ICD-10-CM) - Idiopathic peripheral neuropathy  R53.1 (ICD-10-CM) - Generalized weakness    THERAPY DIAG:  Other abnormalities of gait and mobility  Muscle weakness (generalized)  Other lack of coordination  Rationale for Evaluation and Treatment: Rehabilitation  ONSET DATE: 07/12/23  SUBJECTIVE:   SUBJECTIVE STATEMENT:  Doing fine, packing up suitcases to move tomorrow and the day after.   PERTINENT HISTORY: 11/13/23--73 year old male with history of diabetes, neuropathy, hypertension, hyperlipidemia here for evaluation of memory, cognitive changes and generalized weakness.   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  He was admitted to the hospital for surgical treatment with Whipple procedure.  He was discharged home with couple weeks later.  He was readmitted on 05/13/2023 after increased diarrhea, weakness and falling down at home.  He was admitted for approximately 4 weeks with ongoing vomiting, diarrhea, protein calorie malnutrition, colitis and muscular deconditioning.  This time he was discharged to skilled nursing facility.   06/26/2023 patient fell down at nursing home, hit his head, and went to the emergency room.   07/12/2023 patient fell down again resulting in the left femoral neck fracture requiring admission and surgery. Overall patient has had significant difficulty returning to his baseline.  Wife notes that he is having some ongoing short-term memory difficulty, decreased insight, brain fog issues.  Also continues to be generally deconditioned and weak.  They live in a two-story home with the bedroom and bathroom upstairs, 14 steps, and this remains a challenge for patient to transition from skilled nursing facility back home.   Pt is a 73 y.o. male admitted via EMS 07/12/23 for fall at rehab facility. X-ray showed L femoral neck fx. L THA performed 4/3. 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,  basal cell  carcinoma, diabetes mellitus, diabetic neuropathy, neuromuscular disorder, hyperlipidemia, malignant neuroendocrine tumor of duodenum   PAIN:  Are you having pain? Yes: NPRS scale: little soreness/10 Pain location: general  Pain description: mm soreness after last PT session Aggravating factors: worse at end of day Relieving factors: meds, lying down, exercise   PRECAUTIONS: Fall  RED FLAGS: None   WEIGHT BEARING RESTRICTIONS: No  FALLS:  Has patient fallen in last 6 months? Yes. Number of falls no falls in the last 3 months, but at least 12 in the last 6 months   LIVING ENVIRONMENT: Lives with: lives with their spouse Lives in: House/apartment Stairs: Yes: Internal: 14 steps; on right going up and on left going up and External: 2 in garage and 2 to front door steps; can reach both and bilateral but cannot reach both Has following equipment at home: Single point cane and Walker - 2 wheeled  **currently living at a Assisted Living for now**  OCCUPATION: An Tree surgeon   PLOF: Independent, Independent with basic ADLs, and Independent with gait  PATIENT GOALS: to get more independent, get back to my home  NEXT MD VISIT:   OBJECTIVE:  Note: Objective measures were completed at Evaluation unless otherwise  noted.  DIAGNOSTIC FINDINGS: Left hip arthroplasty in expected alignment. No periprosthetic lucency or fracture. Recent postsurgical change includes air and edema in the soft tissues. Left hip arthroplasty without immediate postoperative complication.   COGNITION: Overall cognitive status: Within functional limits for tasks assessed     SENSATION: Neuropathy in B feet    POSTURE: rounded shoulders and forward head   LOWER EXTREMITY ROM: WNL   LOWER EXTREMITY MMT: 4+/5 BLE    FUNCTIONAL TESTS:  5 times sit to stand: 19s  Timed up and go (TUG): 22s  GAIT: Distance walked: in clinic distances Assistive device utilized: Environmental consultant - 2 wheeled and None Level of  assistance: Complete Independence and Modified independence Comments: able to complete TUG without walker, slow but is safe                                                                                                             TREATMENT DATE: 01/23/24 Bike L4 x13mins Leg ext 10# 2x12 HS curls 25# 2x12  5xSTS  Modified sit up w/4# 2x10 Modified twists with 4# 2x10 Leg press 20# 2x12  Walking laps 569ft w/ walker   01/18/24 NuStep L5x62mins Recheck goals -TUG 13.91s w/o walker  -stairs 2HRA Walking on beam Walking with cane 2 laps  Leg ext 10# 2x10 HS curls 25# 2x10 STS holding yellow ball 2x10  01/16/24 Nustep Lvl 5 for 8 minutes for cardiovascular endurance Sit>Stand x 10 with Weighted Medicine Ball (Red) **5xSTS = 19.47 sec with UE pushing through legs Lateral Step Downs x 8 each side Functional Reaching High to place objects low (mimic dishes and cleaning up cabinets) while on firm surface - progressed to standing on foam pad - able to perform with specific sequences with mild errors with self-correction (number order) Standing Lunge Stretch at the steps x 15 second holds on each leg 4x Standing HS Stretch x 10 second holds 2x each side  Vitals: 153/94, 98 bpm; 130/86 86 bpm  01/10/24  Nustep L5x8 minutes all four extremities seat 13   Gait inside and outside with his 4WW- much safer with 4WW than RW, needed min cues for curb navigation and speed going downhill but only because this was a new device to him. Basic education on 4WW lock management with transfers   Tandem stance solid surface 3x30 seconds close min guard  Side steps on blue foam pad in // bars x4 laps light minA Forward step ups 4 inch box on blue foam x10 B min guard  Standing marches on blue foam pad x20 alternating up to ModA due to anterior lean with this task  01/09/24 NuStep L 5 x 6 min 20lb resisted gait 4 way x 3 Alt 6 in box taps x 5 each  From airex x10 each 6in step ups forward and  lateral 2x10 LOB x5 mod to max assist to correct  HS curls 35lb 2x12 Leg Ext 10lb 2x12 Sit to stand LE on airex 2x10  PATIENT EDUCATION:  Education details: POC, HEP, fall risk Person educated:  Patient and Spouse Education method: Explanation and Demonstration Education comprehension: verbalized understanding and returned demonstration  HOME EXERCISE PROGRAM: Access Code: [email or web address] URL: [email or web address] Date: 11/21/2023 Prepared by: Almetta Fam  Exercises - Sit to Stand  - 1 x daily - 7 x weekly - 2 sets - 10 reps - Seated Long Arc Quad  - 1 x daily - 7 x weekly - 2 sets - 10 reps - Standing Hip Abduction with Counter Support  - 1 x daily - 7 x weekly - 2 sets - 10 reps - Standing Hip Extension with Counter Support  - 1 x daily - 7 x weekly - 2 sets - 10 reps - Heel Raises with Counter Support  - 1 x daily - 7 x weekly - 2 sets - 10 reps  ASSESSMENT:  CLINICAL IMPRESSION: Focused primarily on functional balance to help prepare for his transition to the new ALF tomorrow. Has met some of his goals but not all. Ending PT today due to leaving for ALF where he will continue with therapies. Continues to demonstrate impulsiveness with mobility and moves very quickly, requires cues for safety.  EVAL: Patient is a 73 y.o. male who was seen today for physical therapy evaluation and treatment for weakness. He has an extensive medical history and multiple surgeries that has caused him to lose a lot of weight and muscle mass. His wife reports a number of falls since the beginning of the year but none in the past 3 months. Currently he is staying at an assisted living to work on ADLs and increasing his independence. He walks with a walker outside of his home. His wife reports he has a cane, but needs practice on how to use it. Pt reports he does some walking around the house without it. At their home, he has stairs up to the bedroom and bathrooms so would like to practice working on  this. He does have neuropathy and reports high levels of pain at times in both feet. Patient will benefit from PT to increase his strength and allow him to improve functional independence and mobility.   OBJECTIVE IMPAIRMENTS: Abnormal gait, decreased activity tolerance, decreased balance, decreased coordination, decreased endurance, difficulty walking, decreased strength, decreased safety awareness, and pain.   ACTIVITY LIMITATIONS: carrying, lifting, squatting, stairs, transfers, and locomotion level  PARTICIPATION LIMITATIONS: meal prep, cleaning, laundry, driving, shopping, community activity, and yard work  PERSONAL FACTORS: Fitness, Past/current experiences, Time since onset of injury/illness/exacerbation, and 3+ comorbidities: istory of diabetes, neuropathy, hypertension, hyperlipidemia here for evaluation of memory, cognitive changes and generalized weakness are also affecting patient's functional outcome.   REHAB POTENTIAL: Good  CLINICAL DECISION MAKING: Stable/uncomplicated  EVALUATION COMPLEXITY: Low  GOALS: Goals reviewed with patient? Yes  SHORT TERM GOALS: Target date: 01/02/24  Patient will be independent with initial HEP. Baseline:  Goal status: MET 12/13/23  2.  Patient will demonstrate improved functional LE strength as demonstrated by 5xSTS <15s. Baseline: 19s,  Goal status: ongoing 01/16/24 19s, 13s MET 01/23/24  3.  Patient will be educated on strategies to decrease risk of falls.  Baseline:  Goal status: IN PROGRESS 12/13/23, MET 01/18/24   LONG TERM GOALS: Target date: 02/13/24  Patient will be independent with advanced/ongoing HEP to improve outcomes and carryover.  Baseline:  Goal status: MET 01/23/24  2.  Patient will be able to ambulate 500' without AD with good safety to access community.  Baseline: short distances without AD Goal status: NOT MET 01/23/24  3.  Patient  will be able to navigate a set of stairs with reciprocal pattern for safety inside  his home.  Baseline:  Goal status: IN PROGRESS reciprocal with 2HRA , PARTIALLY MET 01/23/24  4.  Patient will demonstrate decreased fall risk by scoring < 14 sec on TUG. Baseline: 22s without walker Goal status: 13.91s MET 01/18/24  PLAN:  PT FREQUENCY: 2x/week  PT DURATION: 12 weeks  PLANNED INTERVENTIONS: 97110-Therapeutic exercises, 97530- Therapeutic activity, 97112- Neuromuscular re-education, 97535- Self Care, 02859- Manual therapy, [phone]- Gait training, [phone]- Electrical stimulation (unattended), 97016- Vasopneumatic device, 20560 (1-2 muscles), 20561 (3+ muscles)- Dry Needling, Patient/Family education, Balance training, Stair training, Joint mobilization, Joint manipulation, Spinal manipulation, Spinal mobilization, Cryotherapy, and Moist heat  PLAN FOR NEXT SESSION: D/C because he is moving to ALF  PHYSICAL THERAPY DISCHARGE SUMMARY  Visits from Start of Care: 10   Patient agrees to discharge. Patient goals were partially met. Patient is being discharged due to transitioning to ALF.   Almetta Fam, PT, DPT 01/23/24 12:22 PM

## 2024-01-23 NOTE — Telephone Encounter (Signed)
 Copied from CRM [id number]. Topic: General - Other >> Jan 23, 2024 10:53 AM Eva FALCON wrote: Reason for CRM: Avelina from [facility] [place] was returning call to [place]. States you can call [phone] and she has that phone on her at all times. No schedule meetings today, will be waiting for that call. States this pt is moving in Thursday and needs the paperwork sent so it does not delay anything.  Got the proper paperwork. Dr Wendolyn is aware and has received paperwork.

## 2024-01-24 NOTE — Telephone Encounter (Signed)
 Copied from CRM #[id number]. Topic: General - Other >> Jan 24, 2024  9:57 AM Aleatha BROCKS wrote: Reason for CRM: [facility] community pending paperwork needs to be email today due to him moving in tomorrow [email or web address]

## 2024-01-24 NOTE — Telephone Encounter (Signed)
 Spoke to pts wife and informed her paperwork has been filled and have been trying to get in touch with [facility] with no luck on speaking with Patti. Left several messages about paperwork. Informed her of trying to fax paperwork to numbers provided with no luck. Pt stated will come by office and pick up paperwork today. Paperwork and notice given to front desk.

## 2024-01-25 ENCOUNTER — Telehealth: Payer: Self-pay

## 2024-01-25 ENCOUNTER — Ambulatory Visit

## 2024-01-25 NOTE — Telephone Encounter (Signed)
 Copied from CRM [id number]. Topic: General - Other >> Jan 25, 2024 11:45 AM Viola FALCON wrote: Reason for CRM: [facility] regarding a FLT2 form that was sent to them from the office and had questions. Please call her at [phone]    Spoke with misty and answered questions needed about certain meds.

## 2024-01-30 DIAGNOSIS — Z0000 Encounter for general adult medical examination without abnormal findings: Secondary | ICD-10-CM | POA: Diagnosis not present

## 2024-01-30 DIAGNOSIS — S72042D Displaced fracture of base of neck of left femur, subsequent encounter for closed fracture with routine healing: Secondary | ICD-10-CM | POA: Diagnosis not present

## 2024-01-30 DIAGNOSIS — F419 Anxiety disorder, unspecified: Secondary | ICD-10-CM | POA: Diagnosis not present

## 2024-01-30 DIAGNOSIS — N3281 Overactive bladder: Secondary | ICD-10-CM | POA: Diagnosis not present

## 2024-01-30 DIAGNOSIS — E119 Type 2 diabetes mellitus without complications: Secondary | ICD-10-CM | POA: Diagnosis not present

## 2024-01-31 ENCOUNTER — Ambulatory Visit

## 2024-01-31 ENCOUNTER — Ambulatory Visit: Admitting: Occupational Therapy

## 2024-01-31 ENCOUNTER — Ambulatory Visit: Admitting: Speech Pathology

## 2024-01-31 DIAGNOSIS — R4789 Other speech disturbances: Secondary | ICD-10-CM | POA: Diagnosis not present

## 2024-01-31 DIAGNOSIS — G3184 Mild cognitive impairment, so stated: Secondary | ICD-10-CM | POA: Diagnosis not present

## 2024-01-31 DIAGNOSIS — Z96642 Presence of left artificial hip joint: Secondary | ICD-10-CM | POA: Diagnosis not present

## 2024-02-01 ENCOUNTER — Ambulatory Visit

## 2024-02-01 DIAGNOSIS — R350 Frequency of micturition: Secondary | ICD-10-CM | POA: Diagnosis not present

## 2024-02-02 DIAGNOSIS — D490 Neoplasm of unspecified behavior of digestive system: Secondary | ICD-10-CM | POA: Diagnosis not present

## 2024-02-02 DIAGNOSIS — M6281 Muscle weakness (generalized): Secondary | ICD-10-CM | POA: Diagnosis not present

## 2024-02-02 DIAGNOSIS — R278 Other lack of coordination: Secondary | ICD-10-CM | POA: Diagnosis not present

## 2024-02-02 DIAGNOSIS — R2681 Unsteadiness on feet: Secondary | ICD-10-CM | POA: Diagnosis not present

## 2024-02-02 DIAGNOSIS — S72142D Displaced intertrochanteric fracture of left femur, subsequent encounter for closed fracture with routine healing: Secondary | ICD-10-CM | POA: Diagnosis not present

## 2024-02-02 DIAGNOSIS — C7A8 Other malignant neuroendocrine tumors: Secondary | ICD-10-CM | POA: Diagnosis not present

## 2024-02-02 DIAGNOSIS — D132 Benign neoplasm of duodenum: Secondary | ICD-10-CM | POA: Diagnosis not present

## 2024-02-02 DIAGNOSIS — Z8719 Personal history of other diseases of the digestive system: Secondary | ICD-10-CM | POA: Diagnosis not present

## 2024-02-05 ENCOUNTER — Encounter: Payer: Self-pay | Admitting: Physical Medicine & Rehabilitation

## 2024-02-05 ENCOUNTER — Encounter (HOSPITAL_BASED_OUTPATIENT_CLINIC_OR_DEPARTMENT_OTHER): Admitting: Physical Medicine & Rehabilitation

## 2024-02-05 VITALS — BP 117/74 | HR 79 | Ht 71.0 in | Wt 156.0 lb

## 2024-02-05 DIAGNOSIS — E1142 Type 2 diabetes mellitus with diabetic polyneuropathy: Secondary | ICD-10-CM

## 2024-02-05 DIAGNOSIS — R2681 Unsteadiness on feet: Secondary | ICD-10-CM | POA: Diagnosis not present

## 2024-02-05 DIAGNOSIS — Z794 Long term (current) use of insulin: Secondary | ICD-10-CM

## 2024-02-05 DIAGNOSIS — S72142D Displaced intertrochanteric fracture of left femur, subsequent encounter for closed fracture with routine healing: Secondary | ICD-10-CM | POA: Diagnosis not present

## 2024-02-05 DIAGNOSIS — M6281 Muscle weakness (generalized): Secondary | ICD-10-CM | POA: Diagnosis not present

## 2024-02-05 NOTE — Progress Notes (Signed)
 Subjective:    Patient ID: Andrew Tanner, male    DOB: January 02, 1951, 73 y.o.   MRN: [id number]  HPI  Discussed the use of AI scribe software for clinical note transcription with the patient, who gave verbal consent to proceed.    CC: Diabetic neuropathy. Referred by Dr. Silva.  Andrew Tanner is a 73 y.o. year old male  who  has a past medical history of Anxiety, Atrial myxoma, Basal cell carcinoma, Cancer (HCC), Cataract, Depression, Diabetes mellitus without complication (HCC), Diabetic neuropathy (HCC), Duodenal adenoma, Hyperlipidemia, Hypertension, IC (interstitial cystitis), Neuromuscular disorder (HCC) (01/09/21), Pancreatic lesion, and Squamous cell carcinoma of skin.   They are presenting to PM&R clinic as a new patient for pain management evaluation. They were referred by Dr. Silva for treatment of polyneuropathy pain.  Red flag symptoms: No red flags for back pain endorsed in Hx or ROS     History of Present Illness: Reports onset of symptoms approximately 2.5 years ago, starting with loss of sensation in the right big toe. Symptoms have progressively worsened. Describes a pattern of pain starting in the morning, often initiated by contact with a cold floor. The sensation begins as coldness in the feet, which then transitions to pain. The right foot is more affected than the left. Pain starts on the bottoms of the feet and moves to the tops, with associated sharp pains in the toes. Describes the pre-pain sensation as a tingling or buzzing feeling. Symptoms do not typically interfere with sleep at night, though he may feel it when getting into bed before it subsides. Reports occasional stinging pain in the right knee, possibly related to physical therapy exercises. No symptoms reported in the hands.  Reports significant life events including a Whipple procedure and a hip surgery within the last year, with a 50+ pound weight loss. Reports some cognitive challenges  post-surgery which have improved with effort. Currently uses a walker for stability due to balance issues. Is moving to an assisted living facility next week to better manage mobility and care needs.  Medication Review: - Amitriptyline: Currently taking a low dose. Reports it helps with sleep and relaxation but provides no positive effect on neuropathic pain. - Prozac  (fluoxetine ): Currently taking. - Gabapentin : Previously trialed up to 500mg  daily. Discontinued due to side effects, including orthostatic lightheadedness and a syncopal episode. Reported feeling drunk and too loose. - Lyrica  (pregabalin ): Not previously trialed. - Klonopin  (clonazepam ): 1.5mg  at night for sleep. - Topical agents: Has tried frankincense and myrrh brand cream, a magnesium  spray - OTC analgesics: Aspirin  and Tylenol  are not effective. Avoiding NSAIDs post-surgery.  Past Medical History: - Diabetes mellitus type 2, transitioned from oral agents to insulin . Uses a Dexcom system. Reports difficulty with diet adherence due to limited food options at his current residence. - Peripheral neuropathy. Confirmed by nerve conduction study performed by neurology (Dr. Lomax and Dr. Onita) approximately one year ago. 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 - History of right hip fracture after a fall, treated with hip replacement. - Cervical laminoplasty (C3-C6) with titanium hardware, approximately 12 years ago. - Essential tremor, pre-dating neck surgery.  Other Treatments: - Physical Therapy: Currently engaged in PT to improve balance and strength. - TENS machine: Reports some benefit. - Spinal Cord Stimulator (SCS): Underwent a successful HFX trial with Dr. Vintram which improved walking confidence. Was close to receiving a permanent implant, but this was delayed due to subsequent major surgeries (Whipple, hip). Was later dropped from  the SCS program's active patient list due to time  lapse.  EMG 02/10/22 (from Dr. Margaret neurology note 11/13/23) This is an abnormal study.  There is electrodiagnostic evidence of length-dependent sensorimotor neuropathy, with mixed axonal and demyelinating features.  This can be seen in patient with long history of diabetes.  In addition there is evidence of chronic right C5-6-7 cervical radiculopathy; moderate right carpal tunnel syndrome.   Medications tried: Topical medications - menthol/mg cream- minimal benefit  Nsaids - Ibuprofen  doesn't help  Tylenol  - Doesn't help Opiates - N/a Gabapentin  - developed side effects at 500mg  daily, made his feel strange/passed out when getting up? Lyrica - denies TCAs  amitriptyline helps  SNRIs  on SSRI    Other treatments: PT- currently working with PT TENs unit- helps his pain Surgery  Hip surgery after fracture, Whipple procedure, C spine surgery about 12- years ago    Prior UDS results: No results found for: LABOPIA, COCAINSCRNUR, LABBENZ, AMPHETMU, THCU, LABBARB    Interval History 02/05/24 Follow-up discussion regarding Qutenza (capsaicin 8%) patch for diabetic neuropathy. The discussion focused on patient questions and concerns about the treatment. The patient has tried over-the-counter capsaicin (2.8%) with some mild, transient pain but no significant relief. Concerns were raised about the potential for increased pain with the higher prescription concentration (8%).  Symptom pattern is consistent: starts in the morning with stinging in the right big toe, then spreads to the other toes and the plantar surface of the right foot. This is followed by a cold sensation, then intermittent burning and tingling. The right foot is more symptomatic than the left. He is able to sleep well at night and is not bothered by the pain then. Elevating the feet provides some relief.  Current management includes a TENS machine and magnesium  oil, which provide some relief. Amitriptyline at a very  small dose is reported to be helping with mood but not significantly with pain. He has had no side effects from the amitriptyline. There is a desire to avoid changes to oral medications, specifically noting prior side effects with gabapentin  and concerns about Lyrica .  Pain Inventory Average Pain 5 Pain Right Now 3 My pain is intermittent, sharp, burning, dull, stabbing, tingling, and aching  In the last 24 hours, has pain interfered with the following? General activity 2 Relation with others 2 Enjoyment of life 7 What TIME of day is your pain at its worst? morning , daytime, and evening Sleep (in general) Good  Pain is worse with: sitting Pain improves with: TENS and magnesium  cream Relief from Meds: 0     Family History  Problem Relation Age of Onset   Lung cancer Mother    Heart disease Father    Hyperlipidemia Father    Diabetes Father        type 1   Diabetes Brother    Depression Brother    Renal cancer Brother    Anxiety disorder Brother    Stomach cancer Neg Hx    Colon cancer Neg Hx    Pancreatic cancer Neg Hx    Esophageal cancer Neg Hx    Rectal cancer Neg Hx    Social History   Socioeconomic History   Marital status: Married    Spouse name: [name] Speed   Number of children: 1   Years of education: Not on file   Highest education level: Bachelor's degree (e.g., BA, AB, BS)  Occupational History   Not on file  Tobacco Use   Smoking status:  Former    Current packs/day: 0.00    Average packs/day: 1 pack/day for 5.0 years (5.0 ttl pk-yrs)    Types: Cigarettes    Start date: 03/02/1977    Quit date: 03/02/1982    Years since quitting: 41.9   Smokeless tobacco: Never   Tobacco comments:    quit 28 yrs ago  Vaping Use   Vaping status: Never Used  Substance and Sexual Activity   Alcohol use: Not Currently    Comment: none   Drug use: No   Sexual activity: Not Currently    Birth control/protection: Abstinence  Other Topics Concern   Not on file   Social History Narrative   Retired risk analyst, 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   Social Drivers of Corporate Investment Banker Strain: Low Risk  (01/08/2024)   Overall Financial Resource Strain (CARDIA)    Difficulty of Paying Living Expenses: Not hard at all  Food Insecurity: No Food Insecurity (01/08/2024)   Hunger Vital Sign    Worried About Running Out of Food in the Last Year: Never true    Ran Out of Food in the Last Year: Never true  Transportation Needs: No Transportation Needs (01/08/2024)   PRAPARE - Administrator, Civil Service (Medical): No    Lack of Transportation (Non-Medical): No  Physical Activity: Sufficiently Active (01/08/2024)   Exercise Vital Sign    Days of Exercise per Week: 5 days    Minutes of Exercise per Session: 50 min  Stress: No Stress Concern Present (01/08/2024)   [employer] of Occupational Health - Occupational Stress Questionnaire    Feeling of Stress: Not at all  Social Connections: Socially Integrated (01/08/2024)   Social Connection and Isolation Panel    Frequency of Communication with Friends and Family: More than three times a week    Frequency of Social Gatherings with Friends and Family: Three times a week    Attends Religious Services: More than 4 times per year    Active Member of Clubs or Organizations: Yes    Attends Banker Meetings: 1 to 4 times per year    Marital Status: Married   Past Surgical History:  Procedure Laterality Date   BIOPSY  12/05/2022   Procedure: BIOPSY;  Surgeon: Mansouraty, Aloha Raddle., MD;  Location: [facility];  Service: Gastroenterology;;   CATARACT EXTRACTION, BILATERAL     ESOPHAGOGASTRODUODENOSCOPY N/A 12/05/2022   Procedure: ESOPHAGOGASTRODUODENOSCOPY (EGD);  Surgeon: Wilhelmenia Aloha Raddle., MD;  Location: [facility];  Service: Gastroenterology;  Laterality: N/A;   EUS N/A 12/05/2022   Procedure: UPPER ENDOSCOPIC ULTRASOUND (EUS) RADIAL;  Surgeon: Wilhelmenia Aloha Raddle., MD;  Location: [facility];  Service: Gastroenterology;  Laterality: N/A;   FINE NEEDLE ASPIRATION N/A 12/05/2022   Procedure: FINE NEEDLE ASPIRATION (FNA) LINEAR;  Surgeon: Wilhelmenia Aloha Raddle., MD;  Location: [facility];  Service: Gastroenterology;  Laterality: N/A;   HERNIA REPAIR N/A    inguinal   NECK SURGERY     ant/post   SPINE SURGERY     SUBMUCOSAL TATTOO INJECTION  12/05/2022   Procedure: SUBMUCOSAL TATTOO INJECTION;  Surgeon: Wilhelmenia Aloha Raddle., MD;  Location: [facility];  Service: Gastroenterology;;   TOTAL HIP ARTHROPLASTY Left 07/13/2023   Procedure: ARTHROPLASTY, HIP, TOTAL, ANTERIOR APPROACH;  Surgeon: Kendal Franky SQUIBB, MD;  Location: [facility];  Service: Orthopedics;  Laterality: Left;  [employer] bipolar Hana table  hemi hip   TRANSURETHRAL RESECTION OF PROSTATE     TURP  VAPORIZATION     WHIPPLE PROCEDURE N/A 04/27/2023   Procedure: WHIPPLE PROCEDURE;  Surgeon: Aron Shoulders, MD;  Location: [facility];  Service: General;  Laterality: N/A;   Past Medical History:  Diagnosis Date   Anxiety    Atrial myxoma    Basal cell carcinoma    Cancer (HCC)    basal cell skin CA   Cataract    Depression    Diabetes mellitus without complication (HCC)    Diabetic neuropathy (HCC)    Duodenal adenoma    Hyperlipidemia    Hypertension    IC (interstitial cystitis)    Neuromuscular disorder (HCC) 01/09/21   Peripheral  Neuropathy   Pancreatic lesion    Squamous cell carcinoma of skin    BP 117/74   Pulse 79   Ht 5' 11 (1.803 m)   Wt 156 lb (70.8 kg)   SpO2 98%   BMI 21.76 kg/m   Opioid Risk Score:   Fall Risk Score:  `1  Depression screen [facility] 2/9     01/15/2024   11:31 AM 01/08/2024    1:23 PM 12/27/2023    4:31 PM 03/20/2023    9:56 AM 01/16/2023   10:34 AM 01/03/2023    1:53 PM 11/21/2022    8:24 AM  Depression screen PHQ 2/9  Decreased Interest 0 0 0 0 0 0 1  Down, Depressed, Hopeless 0 0 2 1 2  0 1  PHQ - 2 Score 0 0 2 1 2  0 2  Altered  sleeping 0 0 2 0 0 0 1  Tired, decreased energy 0 0 1 0 1 0 0  Change in appetite    0 0 0 0  Feeling bad or failure about yourself  0 0 0 0 0 0 0  Trouble concentrating 1 0 0 0 0 0 0  Moving slowly or fidgety/restless 1 0 0 0 0 0 0  Suicidal thoughts 0 0 0  0 0 0  PHQ-9 Score 2 0 5 1 3  0 3  Difficult doing work/chores Not difficult at all Not difficult at all Somewhat difficult Not difficult at all Not difficult at all Not difficult at all Somewhat difficult     Review of Systems  Musculoskeletal:  Positive for gait problem.  Neurological:  Positive for tremors, weakness and numbness.       Tingling  Psychiatric/Behavioral:  Positive for dysphoric mood. The patient is nervous/anxious.   All other systems reviewed and are negative.      Objective:   Physical Exam   Gen: no distress, normal appearing HEENT: oral mucosa pink and moist, NCAT Chest: normal effort, normal rate of breathing Abd: soft, non-distended Ext: no edema Psych: pleasant, normal affect Skin: intact Neuro: Alert and awake, follows commands, cranial nerves II through XII grossly intact, normal speech and language Moving all 4 extremities to gravity  - Sensation: Sensation to light touch is reportedly altered in the feet, described as feeling cold. The right foot is more affected than the left. Sensation is intact proximally in the lower legs.  No abnormal tone noted Musculoskeletal:  Ambulates with walker   Prior exam - Gait: Ambulates with a walker for stability. Gait appears cautious. - Strength: - Reflexes: Patellar reflexes are 1+ and symmetric. Achilles reflexes are absent bilaterally. - Palpation: No tenderness to palpation of knees or along the spine. Surgical scar visible on the  neck well-healed.     Assessment & Plan:    ASSESSMENT 1.  Diabetic  polyneuropathy:Symptomatic, affecting feet bilaterally (R>L), characterized by painful cold sensations, tingling, and sharp pains. Current  priority is improving balance and strength, making him hesitant to trial medications with potential cognitive or balance-related side effects. 2.  History of multiple complex surgeries: Including Whipple procedure and hip replacement, contributing to deconditioning and balance impairment.  PLAN 1. Proceed with scheduling for Qutenza 8% patch application. Patches will be applied to the top and bottom of both feet. 2.  Counseled on the mechanism of action and expected outcomes. Explained that treatment effect is cumulative, with benefits potentially increasing over the first 3-4 treatments.  3.  Discussed potential side effects, primarily post-procedure burning or irritation at the application site, which is typically mild and transient but can be more severe. Advised that ice/cold water  is the primary management for this discomfort. 2.  Pharmacotherapy:     - Lyrica : Discussed as an alternative oral agent. Due to his history of side effects with gabapentin  and current focus on balance, he has elected to defer trial of Lyrica .      - Cymbalta : Discussed as another option, would require coordination with his psychiatrist to manage the transition from Prozac .     - Amitriptyline: He reports its helping mood but not pain     -No changes to current oral medications at this time. Will continue amitriptyline, magnesium  oil, and TENS unit as needed for symptomatic relief.  3.  Continue current management:     - Continue use of TENS machine as needed.     - Continue management of diabetes with his primary team.

## 2024-02-06 ENCOUNTER — Ambulatory Visit

## 2024-02-06 ENCOUNTER — Ambulatory Visit: Admitting: Speech Pathology

## 2024-02-06 ENCOUNTER — Ambulatory Visit: Admitting: Occupational Therapy

## 2024-02-06 DIAGNOSIS — R350 Frequency of micturition: Secondary | ICD-10-CM | POA: Diagnosis not present

## 2024-02-06 DIAGNOSIS — D519 Vitamin B12 deficiency anemia, unspecified: Secondary | ICD-10-CM | POA: Diagnosis not present

## 2024-02-06 DIAGNOSIS — I10 Essential (primary) hypertension: Secondary | ICD-10-CM | POA: Diagnosis not present

## 2024-02-06 DIAGNOSIS — E039 Hypothyroidism, unspecified: Secondary | ICD-10-CM | POA: Diagnosis not present

## 2024-02-06 DIAGNOSIS — E782 Mixed hyperlipidemia: Secondary | ICD-10-CM | POA: Diagnosis not present

## 2024-02-06 DIAGNOSIS — E559 Vitamin D deficiency, unspecified: Secondary | ICD-10-CM | POA: Diagnosis not present

## 2024-02-07 ENCOUNTER — Ambulatory Visit: Admitting: Cardiology

## 2024-02-07 DIAGNOSIS — R2681 Unsteadiness on feet: Secondary | ICD-10-CM | POA: Diagnosis not present

## 2024-02-07 DIAGNOSIS — R4789 Other speech disturbances: Secondary | ICD-10-CM | POA: Diagnosis not present

## 2024-02-07 DIAGNOSIS — R278 Other lack of coordination: Secondary | ICD-10-CM | POA: Diagnosis not present

## 2024-02-07 DIAGNOSIS — Z96642 Presence of left artificial hip joint: Secondary | ICD-10-CM | POA: Diagnosis not present

## 2024-02-07 DIAGNOSIS — G3184 Mild cognitive impairment, so stated: Secondary | ICD-10-CM | POA: Diagnosis not present

## 2024-02-08 ENCOUNTER — Ambulatory Visit

## 2024-02-09 DIAGNOSIS — R278 Other lack of coordination: Secondary | ICD-10-CM | POA: Diagnosis not present

## 2024-02-09 DIAGNOSIS — R2681 Unsteadiness on feet: Secondary | ICD-10-CM | POA: Diagnosis not present

## 2024-02-09 DIAGNOSIS — S72142D Displaced intertrochanteric fracture of left femur, subsequent encounter for closed fracture with routine healing: Secondary | ICD-10-CM | POA: Diagnosis not present

## 2024-02-09 DIAGNOSIS — M6281 Muscle weakness (generalized): Secondary | ICD-10-CM | POA: Diagnosis not present

## 2024-02-10 DIAGNOSIS — R2681 Unsteadiness on feet: Secondary | ICD-10-CM | POA: Diagnosis not present

## 2024-02-10 DIAGNOSIS — S72142D Displaced intertrochanteric fracture of left femur, subsequent encounter for closed fracture with routine healing: Secondary | ICD-10-CM | POA: Diagnosis not present

## 2024-02-10 DIAGNOSIS — M6281 Muscle weakness (generalized): Secondary | ICD-10-CM | POA: Diagnosis not present

## 2024-02-12 DIAGNOSIS — S72142D Displaced intertrochanteric fracture of left femur, subsequent encounter for closed fracture with routine healing: Secondary | ICD-10-CM | POA: Diagnosis not present

## 2024-02-12 DIAGNOSIS — R2681 Unsteadiness on feet: Secondary | ICD-10-CM | POA: Diagnosis not present

## 2024-02-12 DIAGNOSIS — M6281 Muscle weakness (generalized): Secondary | ICD-10-CM | POA: Diagnosis not present

## 2024-02-13 DIAGNOSIS — R278 Other lack of coordination: Secondary | ICD-10-CM | POA: Diagnosis not present

## 2024-02-13 DIAGNOSIS — R2681 Unsteadiness on feet: Secondary | ICD-10-CM | POA: Diagnosis not present

## 2024-02-14 DIAGNOSIS — R278 Other lack of coordination: Secondary | ICD-10-CM | POA: Diagnosis not present

## 2024-02-14 DIAGNOSIS — R2681 Unsteadiness on feet: Secondary | ICD-10-CM | POA: Diagnosis not present

## 2024-02-14 DIAGNOSIS — R4789 Other speech disturbances: Secondary | ICD-10-CM | POA: Diagnosis not present

## 2024-02-14 DIAGNOSIS — Z96642 Presence of left artificial hip joint: Secondary | ICD-10-CM | POA: Diagnosis not present

## 2024-02-14 DIAGNOSIS — G3184 Mild cognitive impairment, so stated: Secondary | ICD-10-CM | POA: Diagnosis not present

## 2024-02-15 DIAGNOSIS — R351 Nocturia: Secondary | ICD-10-CM | POA: Diagnosis not present

## 2024-02-15 DIAGNOSIS — R350 Frequency of micturition: Secondary | ICD-10-CM | POA: Diagnosis not present

## 2024-02-15 DIAGNOSIS — S72142D Displaced intertrochanteric fracture of left femur, subsequent encounter for closed fracture with routine healing: Secondary | ICD-10-CM | POA: Diagnosis not present

## 2024-02-15 DIAGNOSIS — R3915 Urgency of urination: Secondary | ICD-10-CM | POA: Diagnosis not present

## 2024-02-15 DIAGNOSIS — M6281 Muscle weakness (generalized): Secondary | ICD-10-CM | POA: Diagnosis not present

## 2024-02-15 DIAGNOSIS — R2681 Unsteadiness on feet: Secondary | ICD-10-CM | POA: Diagnosis not present

## 2024-02-16 DIAGNOSIS — R2681 Unsteadiness on feet: Secondary | ICD-10-CM | POA: Diagnosis not present

## 2024-02-16 DIAGNOSIS — S72142D Displaced intertrochanteric fracture of left femur, subsequent encounter for closed fracture with routine healing: Secondary | ICD-10-CM | POA: Diagnosis not present

## 2024-02-16 DIAGNOSIS — R278 Other lack of coordination: Secondary | ICD-10-CM | POA: Diagnosis not present

## 2024-02-16 DIAGNOSIS — M6281 Muscle weakness (generalized): Secondary | ICD-10-CM | POA: Diagnosis not present

## 2024-02-19 DIAGNOSIS — R278 Other lack of coordination: Secondary | ICD-10-CM | POA: Diagnosis not present

## 2024-02-19 DIAGNOSIS — R2681 Unsteadiness on feet: Secondary | ICD-10-CM | POA: Diagnosis not present

## 2024-02-21 ENCOUNTER — Encounter: Payer: Self-pay | Admitting: Dermatology

## 2024-02-21 ENCOUNTER — Ambulatory Visit: Admitting: Dermatology

## 2024-02-21 VITALS — BP 166/91 | HR 97

## 2024-02-21 DIAGNOSIS — R278 Other lack of coordination: Secondary | ICD-10-CM | POA: Diagnosis not present

## 2024-02-21 DIAGNOSIS — L570 Actinic keratosis: Secondary | ICD-10-CM

## 2024-02-21 DIAGNOSIS — W908XXA Exposure to other nonionizing radiation, initial encounter: Secondary | ICD-10-CM

## 2024-02-21 DIAGNOSIS — L219 Seborrheic dermatitis, unspecified: Secondary | ICD-10-CM | POA: Diagnosis not present

## 2024-02-21 DIAGNOSIS — R2681 Unsteadiness on feet: Secondary | ICD-10-CM | POA: Diagnosis not present

## 2024-02-21 MED ORDER — NYSTATIN-TRIAMCINOLONE 100000-0.1 UNIT/GM-% EX OINT: 1.0000 | TOPICAL_OINTMENT | Freq: Two times a day (BID) | CUTANEOUS | 6 refills | Status: AC

## 2024-02-21 NOTE — Patient Instructions (Addendum)

## 2024-02-21 NOTE — Progress Notes (Signed)
   Follow-Up Visit  Patient (and/or pt guardian) consented to the use of AI-assisted tools for note generation.    Subjective  Andrew Tanner is a 73 y.o. male who presents for the following:   Seborrheic Dermatitis Face and Scalp   Patient was last evaluated on 09/21/23.  At this visit patient was prescribed oral Fluconazole  and nystatin -triamcinolone  ointment Advised to use a zinc  shampoo on scalp Recommended to use Aveeno shaving gel Patient reports symptoms are dramatically better. Patient rates itch 0/10. Patient has had no recent flare ups since starting treatment. Patient reports he is using Dove soap for face Patient denies medication changes.  The following portions of the chart were reviewed this encounter and updated as appropriate: medications, allergies, medical history  Review of Systems:  No other skin or systemic complaints except as noted in HPI or Assessment and Plan.  Objective  Well appearing patient in no apparent distress; mood and affect are within normal limits.  A focused examination was performed of the following areas: Face and Scalp  Relevant exam findings are noted in the Assessment and Plan.                Assessment & Plan   Actinic keratosis Presence of one or two actinic keratoses, previously more widespread. - Performed cryotherapy with liquid nitrogen on identified actinic keratoses. - Apply Aquaphor to treated areas.  Seborrheic dermatitis Chronic seborrheic dermatitis with inflammation. Current management includes nystatin  triamcinolone  and Dove soap. Zinc -containing soap (DHS) used for maintenance to prevent flares. - Use nystatin  triamcinolone  only during flares, up to two weeks, then take a break. - Continue using Dove soap or DHS zinc  soap for maintenance.  ACTINIC KERATOSIS (8) Mid Frontal Scalp, Right Forehead (3), Right Parotid Area (2), Right Temple, Right Zygomatic Area Destruction of lesion - Mid Frontal Scalp,  Right Forehead (3), Right Parotid Area (2), Right Temple, Right Zygomatic Area Complexity: simple   Destruction method: cryotherapy   Timeout:  patient name, date of birth, surgical site, and procedure verified Cryotherapy cycles:  8 Outcome: patient tolerated procedure well with no complications   Post-procedure details: wound care instructions given     Return in about 1 year (around 02/20/2025) for seb derm/AK F/U.  I, Lyle Cords, as acting as a neurosurgeon for [employer], DO .   Documentation: I have reviewed the above documentation for accuracy and completeness, and I agree with the above.  Delon Alm, DO

## 2024-02-22 DIAGNOSIS — R2681 Unsteadiness on feet: Secondary | ICD-10-CM | POA: Diagnosis not present

## 2024-02-22 DIAGNOSIS — R278 Other lack of coordination: Secondary | ICD-10-CM | POA: Diagnosis not present

## 2024-02-22 DIAGNOSIS — M6281 Muscle weakness (generalized): Secondary | ICD-10-CM | POA: Diagnosis not present

## 2024-02-22 DIAGNOSIS — S72142D Displaced intertrochanteric fracture of left femur, subsequent encounter for closed fracture with routine healing: Secondary | ICD-10-CM | POA: Diagnosis not present

## 2024-02-23 ENCOUNTER — Encounter: Payer: Self-pay | Admitting: Internal Medicine

## 2024-02-23 ENCOUNTER — Ambulatory Visit: Admitting: Internal Medicine

## 2024-02-23 VITALS — BP 120/64 | HR 109 | Ht 71.0 in | Wt 151.6 lb

## 2024-02-23 DIAGNOSIS — Z794 Long term (current) use of insulin: Secondary | ICD-10-CM | POA: Diagnosis not present

## 2024-02-23 DIAGNOSIS — R2681 Unsteadiness on feet: Secondary | ICD-10-CM | POA: Diagnosis not present

## 2024-02-23 DIAGNOSIS — R278 Other lack of coordination: Secondary | ICD-10-CM | POA: Diagnosis not present

## 2024-02-23 DIAGNOSIS — E1165 Type 2 diabetes mellitus with hyperglycemia: Secondary | ICD-10-CM | POA: Diagnosis not present

## 2024-02-23 DIAGNOSIS — S72142D Displaced intertrochanteric fracture of left femur, subsequent encounter for closed fracture with routine healing: Secondary | ICD-10-CM | POA: Diagnosis not present

## 2024-02-23 DIAGNOSIS — M6281 Muscle weakness (generalized): Secondary | ICD-10-CM | POA: Diagnosis not present

## 2024-02-23 DIAGNOSIS — E7849 Other hyperlipidemia: Secondary | ICD-10-CM | POA: Diagnosis not present

## 2024-02-23 DIAGNOSIS — E1159 Type 2 diabetes mellitus with other circulatory complications: Secondary | ICD-10-CM | POA: Diagnosis not present

## 2024-02-23 DIAGNOSIS — Z7984 Long term (current) use of oral hypoglycemic drugs: Secondary | ICD-10-CM

## 2024-02-23 NOTE — Progress Notes (Signed)
 Patient ID: Andrew Tanner, male   DOB: December 03, 1950, 73 y.o.   MRN: [id number]  HPI: Andrew Tanner is a 73 y.o.-year-old male, initially referred by his PCP, Dr. Wendolyn, returning for follow-up for DM2, dx in ~2010, insulin -dependent, uncontrolled, with complications (aortic atherosclerosis, peripheral neuropathy).  Last visit 4 months ago.  He is here with his wife who offers part of the history including medical events since last visit, diet, activity.  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  He had a complicated course afterwards, including a pancreatic leak.  He had many falls and had a left femoral fracture 07/12/2023, for which he had to have left THR.  He then used to reside in [facility] in [place] due to severe deconditioning and cognitive difficulties.  He now lives in an assisted living facility.    Interim history: No increased urination, blurry vision, nausea, chest pain.  He lost approximately 32 lbs in the 9 months before last visit.  At that time, he was recovering from left hip replacement after he had a hip fracture in 07/2023.  He was in rehab [facility] in [place]). Now in an ALF. In PT. Still has some hip pain and weakness.  Since last visit, he gained 3 pounds back.  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  Reviewed HbA1c: Lab Results  Component Value Date   HGBA1C 6.3 (A) 10/23/2023   HGBA1C 7.3 (A) 08/11/2023   HGBA1C 6.8 (H) 04/21/2023   HGBA1C 7.0 (H) 02/09/2023   HGBA1C 7.8 (H) 07/25/2022   HGBA1C 8.3 (H) 04/25/2022   HGBA1C 7.4 (H) 10/04/2021   HGBA1C 7.1 (H) 07/15/2021   HGBA1C 7.0 (H) 04/06/2021   HGBA1C 6.8 (A) 12/21/2020  12/02/2022: HbA1c 8.1%  Pt is on a regimen of: - Jardiance  25 mg before  breakfast >> stopped in the hospital >> restarted 10 mg daily - Lantus  10 units at bedtime-added 01/18/2023 >> .SABRA. 10 units in a.m.  - Lyumjev  3 to 6 units before dinner-started 01/2023 >> ... 7 units before each meal except 10 units before a high carb breakfast >> 7 units after meals He was previously on glipizide  10 mg before breakfast-stopped 01/2023. He was previously on metformin , but this was stopped in the hospital.  Pt checks his sugars >4 times a day with his CGM:  Prev.:    Previously:   Lowest sugar was 57 >> >100 >> 50s; ?hypoglycemia awareness. Highest sugar was 373 >> ...360.  Glucometer: FreeStyle  Pt's meals are: - Breakfast: Rice checks cereals, Greek yogurt, blueberries, other fruit,  >> English muffin + PB >> eggs + toast + mixed fruit - Lunch: Sandwich with meat on whole-grain bread - Dinner: Meat, beans, sweet potatoes - Snacks: 2, but trying to cut down.  These are usually high carb snacks. He is exercising every day along with walking. Stretching and strenghthening + walking 5 days a week. He lost wt (from 230 lbs) and lost 40 lbs since then.  - no CKD, last BUN/creatinine:  Lab Results  Component Value Date   BUN 16 12/27/2023   BUN 11 07/14/2023   CREATININE 0.82 12/27/2023   CREATININE 0.89 07/14/2023   Lab Results  Component Value Date   MICRALBCREAT 3 03/14/2023   MICRALBCREAT 27 12/31/2018   MICRALBCREAT 4.1 01/01/2018  He  is not on ACE inhibitor/ARB.  -+ HL; last set of lipids: Lab Results  Component Value Date   CHOL 126 07/25/2022   HDL 38.40 (L) 07/25/2022   LDLCALC 66 07/25/2022   TRIG 112.0 07/25/2022   CHOLHDL 3 07/25/2022  He is on Crestor  20 mg daily.  - last eye exam was on 08/16/2022. No DR. He has a history of bilateral cataract surgery.  - + neuropathy; + numbness and tingling in his R > L foot Also, cold feet. He has leg weakness and balance pbs. He was approved for the implanted device -planning to get this in the near  future.  Last foot exam 11/09/2023 - Dr. Silva.  Pt has FH of DM in father (DM1), brother.  He also has a history of HTN, interstitial cystitis, history of cervical spine surgery - 2005?, depression/anxiety. He has an atrial myxoma.  He is on thiamine.  ROS: + See HPI  Past Medical History:  Diagnosis Date   Anxiety    Atrial myxoma    Basal cell carcinoma    Cancer (HCC)    basal cell skin CA   Cataract    Depression    Diabetes mellitus without complication (HCC)    Diabetic neuropathy (HCC)    Duodenal adenoma    Hyperlipidemia    Hypertension    IC (interstitial cystitis)    Neuromuscular disorder (HCC) 01/09/21   Peripheral  Neuropathy   Pancreatic lesion    Squamous cell carcinoma of skin    Past Surgical History:  Procedure Laterality Date   BIOPSY  12/05/2022   Procedure: BIOPSY;  Surgeon: Wilhelmenia Aloha Raddle., MD;  Location: [facility];  Service: Gastroenterology;;   CATARACT EXTRACTION, BILATERAL     ESOPHAGOGASTRODUODENOSCOPY N/A 12/05/2022   Procedure: ESOPHAGOGASTRODUODENOSCOPY (EGD);  Surgeon: Wilhelmenia Aloha Raddle., MD;  Location: [facility];  Service: Gastroenterology;  Laterality: N/A;   EUS N/A 12/05/2022   Procedure: UPPER ENDOSCOPIC ULTRASOUND (EUS) RADIAL;  Surgeon: Wilhelmenia Aloha Raddle., MD;  Location: [facility];  Service: Gastroenterology;  Laterality: N/A;   FINE NEEDLE ASPIRATION N/A 12/05/2022   Procedure: FINE NEEDLE ASPIRATION (FNA) LINEAR;  Surgeon: Wilhelmenia Aloha Raddle., MD;  Location: [facility];  Service: Gastroenterology;  Laterality: N/A;   HERNIA REPAIR N/A    inguinal   NECK SURGERY     ant/post   SPINE SURGERY     SUBMUCOSAL TATTOO INJECTION  12/05/2022   Procedure: SUBMUCOSAL TATTOO INJECTION;  Surgeon: Wilhelmenia Aloha Raddle., MD;  Location: [facility];  Service: Gastroenterology;;   TOTAL HIP ARTHROPLASTY Left 07/13/2023   Procedure: ARTHROPLASTY, HIP, TOTAL, ANTERIOR APPROACH;  Surgeon: Kendal Franky SQUIBB, MD;   Location: [facility];  Service: Orthopedics;  Laterality: Left;  [employer] bipolar Hana table  hemi hip   TRANSURETHRAL RESECTION OF PROSTATE     TURP VAPORIZATION     WHIPPLE PROCEDURE N/A 04/27/2023   Procedure: WHIPPLE PROCEDURE;  Surgeon: Aron Shoulders, MD;  Location: [facility];  Service: General;  Laterality: N/A;   Social History   Socioeconomic History   Marital status: Married    Spouse name: [name] Speed   Number of children: 1   Years of education: Not on file   Highest education level: Bachelor's degree (e.g., BA, AB, BS)  Occupational History   Not on file  Tobacco Use   Smoking status: Former    Current packs/day: 0.00    Average packs/day: 1 pack/day for 5.0 years (5.0 ttl pk-yrs)    Types: Cigarettes  Start date: 03/02/1977    Quit date: 03/02/1982    Years since quitting: 42.0   Smokeless tobacco: Never   Tobacco comments:    quit 28 yrs ago  Vaping Use   Vaping status: Never Used  Substance and Sexual Activity   Alcohol use: Not Currently    Comment: none   Drug use: No   Sexual activity: Not Currently    Birth control/protection: Abstinence  Other Topics Concern   Not on file  Social History Narrative   Retired risk analyst, 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   Social Drivers of Corporate Investment Banker Strain: Low Risk  (01/08/2024)   Overall Financial Resource Strain (CARDIA)    Difficulty of Paying Living Expenses: Not hard at all  Food Insecurity: No Food Insecurity (01/08/2024)   Hunger Vital Sign    Worried About Running Out of Food in the Last Year: Never true    Ran Out of Food in the Last Year: Never true  Transportation Needs: No Transportation Needs (01/08/2024)   PRAPARE - Administrator, Civil Service (Medical): No    Lack of Transportation (Non-Medical): No  Physical Activity: Sufficiently Active (01/08/2024)   Exercise Vital Sign    Days of Exercise per Week: 5 days    Minutes of Exercise per Session: 50 min   Stress: No Stress Concern Present (01/08/2024)   [employer] of Occupational Health - Occupational Stress Questionnaire    Feeling of Stress: Not at all  Social Connections: Socially Integrated (01/08/2024)   Social Connection and Isolation Panel    Frequency of Communication with Friends and Family: More than three times a week    Frequency of Social Gatherings with Friends and Family: Three times a week    Attends Religious Services: More than 4 times per year    Active Member of Clubs or Organizations: Yes    Attends Banker Meetings: 1 to 4 times per year    Marital Status: Married  Catering Manager Violence: Not At Risk (01/08/2024)   Humiliation, Afraid, Rape, and Kick questionnaire    Fear of Current or Ex-Partner: No    Emotionally Abused: No    Physically Abused: No    Sexually Abused: No   Current Outpatient Medications on File Prior to Visit  Medication Sig Dispense Refill   acetaminophen  (TYLENOL ) 500 MG tablet Take 2 tablets (1,000 mg total) by mouth every 6 (six) hours as needed for mild pain (pain score 1-3). 100 tablet 0   alfuzosin  (UROXATRAL ) 10 MG 24 hr tablet Take 1 tablet (10 mg total) by mouth daily with breakfast. 90 tablet 3   amitriptyline (ELAVIL) 10 MG tablet Take 10 mg by mouth at bedtime.     aspirin  EC 81 MG tablet Take 1 tablet (81 mg total) by mouth daily. Swallow whole.     clonazePAM  (KLONOPIN ) 0.5 MG tablet Take 1.5 mg by mouth at bedtime.     Continuous Glucose Sensor (DEXCOM G7 SENSOR) MISC Use to monitor glucose continuously, change every 10 days 6 each 3   empagliflozin  (JARDIANCE ) 10 MG TABS tablet Take 1 tablet (10 mg total) by mouth daily before breakfast. 90 tablet 3   EQ SENNA-S 8.6-50 MG tablet Take 1 tablet by mouth daily.     ferrous sulfate  (FEROSUL) 325 (65 FE) MG tablet Take 325 mg by mouth 2 (two) times daily.     fluconazole  (DIFLUCAN ) 150 MG tablet Take 1 tablet (150 mg  total) by mouth as directed. Take one tablet now  then 1 tablet in 7 days 2 tablet 0   FLUoxetine  (PROZAC ) 20 MG capsule Take 20 mg by mouth daily.     glipiZIDE  (GLUCOTROL ) 10 MG tablet Take 10 mg by mouth daily before breakfast.     glucose blood test strip 1 each by Other route in the morning and at bedtime. Use as instructed checking once per day, or if new symptoms. 100 each 1   hydrOXYzine  (ATARAX /VISTARIL ) 50 MG tablet Take 50 mg by mouth at bedtime as needed (sleep/anxiety.).     insulin  glargine (LANTUS  SOLOSTAR) 100 UNIT/ML Solostar Pen Inject 14 Units into the skin daily. 30 mL 3   Insulin  Lispro-aabc (LYUMJEV  KWIKPEN) 100 UNIT/ML KwikPen Inject 7 Units into the skin 3 (three) times daily before meals. 30 mL 3   Insulin  Pen Needle 32G X 4 MM MISC Use 2x a day 200 each 3   ketoconazole  (NIZORAL ) 2 % shampoo APPLY TO SCALP AND FACE 2 TO 3 TIMES A WEEK. 120 mL 0   lipase/protease/amylase (CREON ) 36000 UNITS CPEP capsule Take 2 capsules (72,000 Units total) by mouth 3 (three) times daily before meals. 180 capsule 5   megestrol  (MEGACE ) 40 MG/ML suspension Take 10 mLs by mouth daily.     nystatin -triamcinolone  ointment (MYCOLOG) Apply 1 Application topically 2 (two) times daily. 30 g 6   pantoprazole  (PROTONIX ) 40 MG tablet Take 40 mg by mouth daily.     polyethylene glycol (MIRALAX  / GLYCOLAX ) 17 g packet Take 17 g by mouth daily as needed (constipation.).     QUEtiapine (SEROQUEL) 25 MG tablet Take 25 mg by mouth at bedtime.     rosuvastatin  (CRESTOR ) 20 MG tablet Take 20 mg by mouth daily.     Vibegron (GEMTESA) 75 MG TABS Take by mouth.     No current facility-administered medications on file prior to visit.   No Known Allergies Family History  Problem Relation Age of Onset   Lung cancer Mother    Heart disease Father    Hyperlipidemia Father    Diabetes Father        type 1   Diabetes Brother    Depression Brother    Renal cancer Brother    Anxiety disorder Brother    Stomach cancer Neg Hx    Colon cancer Neg Hx     Pancreatic cancer Neg Hx    Esophageal cancer Neg Hx    Rectal cancer Neg Hx    PE: BP 120/64   Pulse (!) 109   Ht 5' 11 (1.803 m)   Wt 151 lb 9.6 oz (68.8 kg)   SpO2 99%   BMI 21.14 kg/m  Wt Readings from Last 15 Encounters:  02/23/24 151 lb 9.6 oz (68.8 kg)  02/05/24 156 lb (70.8 kg)  01/15/24 158 lb 3.2 oz (71.8 kg)  01/08/24 152 lb (68.9 kg)  12/27/23 152 lb 2 oz (69 kg)  11/13/23 152 lb (68.9 kg)  10/31/23 152 lb 12.8 oz (69.3 kg)  10/23/23 148 lb 12.8 oz (67.5 kg)  08/11/23 145 lb (65.8 kg)  07/13/23 (P) 150 lb (68 kg)  06/26/23 149 lb (67.6 kg)  06/20/23 148 lb (67.1 kg)  06/08/23 154 lb 8.7 oz (70.1 kg)  04/27/23 178 lb (80.7 kg)  04/21/23 178 lb 4.8 oz (80.9 kg)   Constitutional: frail, thin, in NAD; uses a cane Eyes:  EOMI, no exophthalmos ENT: no neck masses, no cervical lymphadenopathy Cardiovascular:  tachycardia, RR, No MRG Respiratory: CTA B Musculoskeletal: no deformities Skin:no rashes Neurological: no tremor with outstretched hands  ASSESSMENT: 1. DM2, insulin -dependent, uncontrolled, with complications - PN - aortic atherosclerosis (on CT abdomen and pelvis from 12/08/2022)  2. HL  PLAN:  1. Patient with longstanding, uncontrolled, type 2 diabetes, on oral diabetic regimen with SGLT2 inhibitor and also basal-bolus insulin  regimen, with worsening control after his Whipple procedure in 04/2023, but improved at last visit.  At that time, HbA1c was excellent, at 6.3%.  Sugars appeared to be worse in the previous 2 weeks after decreasing the dose of Lantus  due to lows during the night.  Sugars were higher after breakfast especially for a high carb meal including cereals with meal, granola with yogurt, or oatmeal.  Sugars were much better when he was eating eggs with toast.  However, he was in a facility at that time and could not control his breakfast.  We discussed about increasing the dose of Lyumjev  from 7 to 10 units for high carb breakfast, but  otherwise did not change the regimen. CGM interpretation: -At today's visit, we reviewed his CGM downloads: It appears that 62% of values are in target range (goal >70%), while 37% are higher than 180 (goal <25%), and 1% are lower than 70 (goal <4%).  The calculated average blood sugar is 167.  The projected HbA1c for the next 3 months (GMI) is 7.3%. -Reviewing the CGM trends, sugars appear to be improving overnight but mostly increasing after meals, particularly after lunch and dinner.  Upon questioning, he resides in an assisted living facility and he is given the Lyumjev  after meals, rather than before meals.  We discussed about trying to obtain this before meals, but I also discussed with patient and his wife about the possibility of using the CeQur insulin  pump to give himself boluses.  They will check with the facility to see if this would be an option.  If so, we can try to send it to the pharmacy to see if it would be covered for him. - I suggested to:  Patient Instructions  Please continue: - Jardiance  10 mg before breakfast - Lantus  10 units in am  Change: - Lyumjev  7 units before all meals  Please have a lipid panel and urine proteins checked.  Check with the facility if a personal insulin  pump would be allowed (CeQur).  Please return in 4 months.  - we checked his HbA1c: 6.5% (only slightly higher) - advised to check sugars at different times of the day - 4x a day, rotating check times - advised for yearly eye exams >> he is not UTD - return to clinic in 4 months  2. HL Lipid panel was reviewed from last year: LDL slightly above our target of less than 55, HDL low: Lab Results  Component Value Date   CHOL 126 07/25/2022   HDL 38.40 (L) 07/25/2022   LDLCALC 66 07/25/2022   TRIG 112.0 07/25/2022   CHOLHDL 3 07/25/2022  - He continues Crestor  20 mg daily without side effects - he is due for another lipid panel -he prefers to have this checked at the facility  Lela Fendt, MD PhD [facility] Endocrinology

## 2024-02-23 NOTE — Patient Instructions (Addendum)
 Please continue: - Jardiance  10 mg before breakfast - Lantus  10 units in am  Change: - Lyumjev  7 units before all meals  Please have a lipid panel and urine proteins checked.  Check with the facility if a personal insulin  pump would be allowed (CeQur).  Please return in 4 months.

## 2024-02-26 DIAGNOSIS — R2681 Unsteadiness on feet: Secondary | ICD-10-CM | POA: Diagnosis not present

## 2024-02-26 DIAGNOSIS — S72142D Displaced intertrochanteric fracture of left femur, subsequent encounter for closed fracture with routine healing: Secondary | ICD-10-CM | POA: Diagnosis not present

## 2024-02-26 DIAGNOSIS — M6281 Muscle weakness (generalized): Secondary | ICD-10-CM | POA: Diagnosis not present

## 2024-02-26 DIAGNOSIS — R278 Other lack of coordination: Secondary | ICD-10-CM | POA: Diagnosis not present

## 2024-02-27 DIAGNOSIS — N182 Chronic kidney disease, stage 2 (mild): Secondary | ICD-10-CM | POA: Diagnosis not present

## 2024-02-27 DIAGNOSIS — Z7984 Long term (current) use of oral hypoglycemic drugs: Secondary | ICD-10-CM | POA: Diagnosis not present

## 2024-02-27 DIAGNOSIS — Z8781 Personal history of (healed) traumatic fracture: Secondary | ICD-10-CM | POA: Diagnosis not present

## 2024-02-27 DIAGNOSIS — E1142 Type 2 diabetes mellitus with diabetic polyneuropathy: Secondary | ICD-10-CM | POA: Diagnosis not present

## 2024-02-28 DIAGNOSIS — R4789 Other speech disturbances: Secondary | ICD-10-CM | POA: Diagnosis not present

## 2024-02-28 DIAGNOSIS — G3184 Mild cognitive impairment, so stated: Secondary | ICD-10-CM | POA: Diagnosis not present

## 2024-02-28 DIAGNOSIS — R2681 Unsteadiness on feet: Secondary | ICD-10-CM | POA: Diagnosis not present

## 2024-02-28 DIAGNOSIS — Z96642 Presence of left artificial hip joint: Secondary | ICD-10-CM | POA: Diagnosis not present

## 2024-02-28 DIAGNOSIS — R278 Other lack of coordination: Secondary | ICD-10-CM | POA: Diagnosis not present

## 2024-02-28 DIAGNOSIS — S72142D Displaced intertrochanteric fracture of left femur, subsequent encounter for closed fracture with routine healing: Secondary | ICD-10-CM | POA: Diagnosis not present

## 2024-02-28 DIAGNOSIS — M6281 Muscle weakness (generalized): Secondary | ICD-10-CM | POA: Diagnosis not present

## 2024-02-29 DIAGNOSIS — M6281 Muscle weakness (generalized): Secondary | ICD-10-CM | POA: Diagnosis not present

## 2024-02-29 DIAGNOSIS — S72142D Displaced intertrochanteric fracture of left femur, subsequent encounter for closed fracture with routine healing: Secondary | ICD-10-CM | POA: Diagnosis not present

## 2024-02-29 DIAGNOSIS — R3121 Asymptomatic microscopic hematuria: Secondary | ICD-10-CM | POA: Diagnosis not present

## 2024-02-29 DIAGNOSIS — R2681 Unsteadiness on feet: Secondary | ICD-10-CM | POA: Diagnosis not present

## 2024-03-01 DIAGNOSIS — R278 Other lack of coordination: Secondary | ICD-10-CM | POA: Diagnosis not present

## 2024-03-01 DIAGNOSIS — S72142D Displaced intertrochanteric fracture of left femur, subsequent encounter for closed fracture with routine healing: Secondary | ICD-10-CM | POA: Diagnosis not present

## 2024-03-01 DIAGNOSIS — M6281 Muscle weakness (generalized): Secondary | ICD-10-CM | POA: Diagnosis not present

## 2024-03-01 DIAGNOSIS — R2681 Unsteadiness on feet: Secondary | ICD-10-CM | POA: Diagnosis not present

## 2024-03-03 DIAGNOSIS — S72142D Displaced intertrochanteric fracture of left femur, subsequent encounter for closed fracture with routine healing: Secondary | ICD-10-CM | POA: Diagnosis not present

## 2024-03-03 DIAGNOSIS — M6281 Muscle weakness (generalized): Secondary | ICD-10-CM | POA: Diagnosis not present

## 2024-03-03 DIAGNOSIS — R2681 Unsteadiness on feet: Secondary | ICD-10-CM | POA: Diagnosis not present

## 2024-03-04 DIAGNOSIS — R278 Other lack of coordination: Secondary | ICD-10-CM | POA: Diagnosis not present

## 2024-03-04 DIAGNOSIS — N200 Calculus of kidney: Secondary | ICD-10-CM | POA: Diagnosis not present

## 2024-03-04 DIAGNOSIS — R3121 Asymptomatic microscopic hematuria: Secondary | ICD-10-CM | POA: Diagnosis not present

## 2024-03-04 DIAGNOSIS — R2681 Unsteadiness on feet: Secondary | ICD-10-CM | POA: Diagnosis not present

## 2024-03-06 DIAGNOSIS — R2681 Unsteadiness on feet: Secondary | ICD-10-CM | POA: Diagnosis not present

## 2024-03-06 DIAGNOSIS — R278 Other lack of coordination: Secondary | ICD-10-CM | POA: Diagnosis not present

## 2024-03-12 ENCOUNTER — Ambulatory Visit: Payer: Self-pay

## 2024-03-12 ENCOUNTER — Encounter: Admitting: Physical Medicine & Rehabilitation

## 2024-03-12 NOTE — Telephone Encounter (Signed)
    Copied from CRM #[id number]. Topic: Clinical - Red Word Triage >> Mar 12, 2024 10:44 AM Andrew Tanner wrote: Red Word that prompted transfer to Nurse Triage: peripheral neuropathy sharp throbbing burning pain. Per patient he can't not stand the pain. Very painful

## 2024-03-12 NOTE — Telephone Encounter (Signed)
   FYI Only or Action Required?: FYI only for provider: pt needs coordination of care. Pain clinic cancelled appt for today. Pt will need assistance at home when he leaves rehab. Please see note..  Patient was last seen in primary care on 12/27/2023 by Wendolyn Jenkins Jansky, MD.  Called Nurse Triage reporting Pain and Advice Only.  Symptoms began ongoing.  Interventions attempted: Other: was supposed to go to pain management today - office cancelled appt.. Pt and wife meeting with treatment team tomorrow at rehab,  Symptoms are: unchanged.  Triage Disposition: Call PCP When Office is Open  Patient/caregiver understands and will follow disposition?: Yes                  Reason for Disposition  [1] Caller requesting NON-URGENT health information AND [2] PCP's office is the best resource  Answer Assessment - Initial Assessment Questions Wife returned our call. Pt is currently in a rehab facility, in order to return home. Pt is having Peripheral pain. Pt was to have an appt with pain management today. Provider cancelled appt today. Pt was to start with a capsaicin poultice. Tomorrow wife is going to rehab to meet with care team. She is concerned with pt retuning home. Pt will most likely need in home help. Wife states that pt has some cognitive decline, which is why he called today, after frustration with pain medicine provider.  She is looking for support as she helps pt with health care.  Please advise.        1. REASON FOR CALL: What is the main reason for your call? or How can I best help you?     See note 2. SYMPTOMS : Do you have any symptoms?      Pain 3. OTHER QUESTIONS: Do you have any other questions?     Needs help with coordination of care.  Protocols used: Information Only Call - No Triage-A-AH

## 2024-03-12 NOTE — Telephone Encounter (Signed)
 CRM was placed in nurse triage call back, wife has spoken with nurse triage. No further needs at this time. Routing to PCP clinic.

## 2024-03-12 NOTE — Telephone Encounter (Signed)
 This RN made 2nd attempt to contact patient. No answer, left message to call back at the main number for [facility].

## 2024-03-14 ENCOUNTER — Other Ambulatory Visit: Payer: Self-pay

## 2024-03-14 DIAGNOSIS — G609 Hereditary and idiopathic neuropathy, unspecified: Secondary | ICD-10-CM

## 2024-03-14 NOTE — Telephone Encounter (Signed)
  Please Advise   Copied from CRM #[id number]. Topic: Clinical - Red Word Triage >> Mar 12, 2024 10:44 AM Andrew Tanner wrote: Red Word that prompted transfer to Nurse Triage: peripheral neuropathy sharp throbbing burning pain. Per patient he can't not stand the pain. Very painful >> Mar 14, 2024 10:20 AM Andrew Tanner wrote: Patient following up on message sent on 12/2 regarding pain and referral to pain management, patient states he can't get in with pain management until January and can't wait that long. Advised patient Dr. Wendolyn is out of office and will return until Monday. Wants someone to reach out before then, at the point he's willing to try anything to cut the pain.  Andrew Tanner [phone]

## 2024-03-15 ENCOUNTER — Telehealth: Payer: Self-pay | Admitting: Dietician

## 2024-03-15 ENCOUNTER — Encounter: Payer: Self-pay | Admitting: Internal Medicine

## 2024-03-15 DIAGNOSIS — R350 Frequency of micturition: Secondary | ICD-10-CM | POA: Diagnosis not present

## 2024-03-15 NOTE — Telephone Encounter (Signed)
 Patient left a message stating that his blood glucose has been high and that he thinks that he needs an insulin  adjustment.  Called patient who was unavailable.  Dexcom Clarity report printed and given to MD as well as message via EPIC.  Leita Constable, RD, LDN, CDCES, DipACLM

## 2024-03-19 ENCOUNTER — Telehealth: Payer: Self-pay

## 2024-03-19 DIAGNOSIS — N3281 Overactive bladder: Secondary | ICD-10-CM | POA: Diagnosis not present

## 2024-03-19 DIAGNOSIS — N182 Chronic kidney disease, stage 2 (mild): Secondary | ICD-10-CM | POA: Diagnosis not present

## 2024-03-19 DIAGNOSIS — E1122 Type 2 diabetes mellitus with diabetic chronic kidney disease: Secondary | ICD-10-CM | POA: Diagnosis not present

## 2024-03-19 DIAGNOSIS — Z7984 Long term (current) use of oral hypoglycemic drugs: Secondary | ICD-10-CM | POA: Diagnosis not present

## 2024-03-19 NOTE — Telephone Encounter (Signed)
 Copied from CRM #[id number]. Topic: MyChart - Activation >> Mar 19, 2024  8:21 AM Harlene ORN wrote: MyChart has been activated for Alm Ram. trouble getting access to his MyChart. Attempted to send link to his mobile phone [phone], to reset his password, but the link is permanently sent to his wife's phone number and I am unable to change it [phone]). Please fix link access when available.

## 2024-03-21 ENCOUNTER — Telehealth: Payer: Self-pay

## 2024-03-21 NOTE — Progress Notes (Signed)
 Andrew Tanner                                          MRN: [id number]   03/21/2024   The VBCI Quality Team Specialist reviewed this patient medical record for the purposes of chart review for care gap closure. The following were reviewed: chart review for care gap closure-kidney health evaluation for diabetes:eGFR  and uACR.    VBCI Quality Team

## 2024-03-21 NOTE — Progress Notes (Unsigned)
 Complex Care Management Note Care Guide Note  03/21/2024 Name: Andrew Tanner MRN: [id number] DOB: 1950/08/10   Complex Care Management Outreach Attempts: An unsuccessful telephone outreach was attempted today to offer the patient information about available complex care management services.  Follow Up Plan:  Additional outreach attempts will be made to offer the patient complex care management information and services.   Encounter Outcome:  No Answer  Dreama [facility]  [facility], [facility] VBCI Assistant Direct Dial : [phone]  Fax: [phone]

## 2024-03-24 ENCOUNTER — Other Ambulatory Visit: Payer: Self-pay | Admitting: Family Medicine

## 2024-03-25 NOTE — Progress Notes (Unsigned)
 Complex Care Management Note Care Guide Note  03/25/2024 Name: Conor Lata MRN: [id number] DOB: Jun 11, 1950   Complex Care Management Outreach Attempts: A second unsuccessful outreach was attempted today to offer the patient with information about available complex care management services.  Follow Up Plan:  Additional outreach attempts will be made to offer the patient complex care management information and services.   Encounter Outcome:  No Answer  Dreama [facility]  [facility], [facility] VBCI Assistant Direct Dial : [phone]  Fax: [phone]

## 2024-03-26 ENCOUNTER — Telehealth: Payer: Self-pay

## 2024-03-26 NOTE — Telephone Encounter (Signed)
 Copied from CRM #[id number]. Topic: Appointments - Scheduling Inquiry for Clinic >> Mar 26, 2024  8:40 AM Jasmin G wrote: Reason for CRM: Pt requested if possible, for clinic to reach to [facility] with appt scheduled on Jan 12th to see if it would be possible to get him in sooner as he states that he need the treatment ASAP, pt states that he already contacted the clinic in attempts to get in sooner with no success.

## 2024-03-27 ENCOUNTER — Ambulatory Visit: Admitting: Family Medicine

## 2024-03-28 ENCOUNTER — Other Ambulatory Visit: Payer: Self-pay | Admitting: General Surgery

## 2024-03-28 DIAGNOSIS — Z90410 Acquired total absence of pancreas: Secondary | ICD-10-CM

## 2024-03-28 DIAGNOSIS — Z8719 Personal history of other diseases of the digestive system: Secondary | ICD-10-CM

## 2024-03-28 DIAGNOSIS — D490 Neoplasm of unspecified behavior of digestive system: Secondary | ICD-10-CM

## 2024-03-28 DIAGNOSIS — K8681 Exocrine pancreatic insufficiency: Secondary | ICD-10-CM

## 2024-03-28 NOTE — Progress Notes (Signed)
 Complex Care Management Note Care Guide Note  03/28/2024 Name: Andrew Tanner MRN: [id number] DOB: 1951-04-10   Complex Care Management Outreach Attempts: A third unsuccessful outreach was attempted today to offer the patient with information about available complex care management services.  Follow Up Plan:  No further outreach attempts will be made at this time. We have been unable to contact the patient to offer or enroll patient in complex care management services.  Encounter Outcome:  No Answer  Dreama [facility]  [facility], [facility] VBCI Assistant Direct Dial : [phone]  Fax: [phone]

## 2024-03-29 ENCOUNTER — Emergency Department (HOSPITAL_COMMUNITY)

## 2024-03-29 ENCOUNTER — Other Ambulatory Visit: Payer: Self-pay

## 2024-03-29 ENCOUNTER — Emergency Department (HOSPITAL_COMMUNITY)
Admission: EM | Admit: 2024-03-29 | Discharge: 2024-03-29 | Disposition: A | Discharge status: Skilled Nursing Facility | Attending: Emergency Medicine | Admitting: Emergency Medicine

## 2024-03-29 DIAGNOSIS — S0990XA Unspecified injury of head, initial encounter: Secondary | ICD-10-CM

## 2024-03-29 DIAGNOSIS — Z794 Long term (current) use of insulin: Secondary | ICD-10-CM | POA: Diagnosis not present

## 2024-03-29 DIAGNOSIS — J111 Influenza due to unidentified influenza virus with other respiratory manifestations: Secondary | ICD-10-CM

## 2024-03-29 DIAGNOSIS — W19XXXA Unspecified fall, initial encounter: Secondary | ICD-10-CM

## 2024-03-29 DIAGNOSIS — W01198A Fall on same level from slipping, tripping and stumbling with subsequent striking against other object, initial encounter: Secondary | ICD-10-CM | POA: Diagnosis not present

## 2024-03-29 DIAGNOSIS — Z7982 Long term (current) use of aspirin: Secondary | ICD-10-CM | POA: Insufficient documentation

## 2024-03-29 DIAGNOSIS — J101 Influenza due to other identified influenza virus with other respiratory manifestations: Secondary | ICD-10-CM | POA: Diagnosis not present

## 2024-03-29 DIAGNOSIS — S0101XA Laceration without foreign body of scalp, initial encounter: Secondary | ICD-10-CM | POA: Insufficient documentation

## 2024-03-29 LAB — RESP PANEL BY RT-PCR (RSV, FLU A&B, COVID)  RVPGX2
Influenza A by PCR: POSITIVE — AB
Influenza B by PCR: NEGATIVE
Resp Syncytial Virus by PCR: NEGATIVE
SARS Coronavirus 2 by RT PCR: NEGATIVE

## 2024-03-29 MED ORDER — OSELTAMIVIR PHOSPHATE 75 MG PO CAPS
75.0000 mg | ORAL_CAPSULE | Freq: Once | ORAL | Status: AC
  Administered 2024-03-29: 75 mg via ORAL
  Filled 2024-03-29: qty 1

## 2024-03-29 MED ORDER — ACETAMINOPHEN 500 MG PO TABS
1000.0000 mg | ORAL_TABLET | Freq: Once | ORAL | Status: AC
  Administered 2024-03-29: 1000 mg via ORAL
  Filled 2024-03-29: qty 2

## 2024-03-29 MED ORDER — OSELTAMIVIR PHOSPHATE 75 MG PO CAPS: 75.0000 mg | ORAL_CAPSULE | Freq: Two times a day (BID) | ORAL | 0 refills | Status: DC

## 2024-03-29 NOTE — ED Notes (Signed)
 EDP Messick placed 2 staples to back of pts head to small lac before he was transported to xray at this time.

## 2024-03-29 NOTE — ED Notes (Signed)
 Patient transported to CT

## 2024-03-29 NOTE — ED Provider Notes (Signed)
 " [facility] Provider Note   CSN: [id number] Arrival date & time: 03/29/24  9097     Patient presents with: No chief complaint on file.   Andrew Tanner is a 73 y.o. male.   73 year old male with prior medical history as detailed below presents for evaluation.  Patient with accidental fall this morning.  He did strike his head.  He typically uses a walker as an assist device.  He was attempting to take a few steps without the walker this morning and lost his balance.  He then fell and struck his head.  He denies LOC.  He denies neck pain.  He has a an abrasion to his left elbow.  He denies elbow pain.  He has a small 1 cm laceration to the posterior scalp which is not bleeding.  He also complains of mild sore throat and congestion.  Patient is concerned that he may have strep throat.  Denies shortness of breath or fevers.  The history is provided by the patient and the EMS personnel.       Prior to Admission medications  Medication Sig Start Date End Date Taking? Authorizing Provider  acetaminophen  (TYLENOL ) 500 MG tablet Take 2 tablets (1,000 mg total) by mouth every 6 (six) hours as needed for mild pain (pain score 1-3). 05/10/23   Aron Shoulders, MD  alfuzosin  (UROXATRAL ) 10 MG 24 hr tablet Take 1 tablet by mouth once daily with breakfast 03/24/24   Wendolyn Jenkins Jansky, MD  amitriptyline (ELAVIL) 10 MG tablet Take 10 mg by mouth at bedtime. 11/07/23 11/06/24  [provider]  aspirin  EC 81 MG tablet Take 1 tablet (81 mg total) by mouth daily. Swallow whole. 10/31/23   Patwardhan, Newman PARAS, MD  clonazePAM  (KLONOPIN ) 0.5 MG tablet Take 1.5 mg by mouth at bedtime.    [provider]  Continuous Glucose Sensor (DEXCOM G7 SENSOR) MISC Use to monitor glucose continuously, change every 10 days 09/14/23   Trixie File, MD  empagliflozin  (JARDIANCE ) 10 MG TABS tablet Take 1 tablet (10 mg total) by mouth daily before breakfast.  08/11/23   Trixie File, MD  EQ SENNA-S 8.6-50 MG tablet Take 1 tablet by mouth daily. 10/18/23   [provider]  ferrous sulfate  (FEROSUL) 325 (65 FE) MG tablet Take 325 mg by mouth 2 (two) times daily.    [provider]  fluconazole  (DIFLUCAN ) 150 MG tablet Take 1 tablet (150 mg total) by mouth as directed. Take one tablet now then 1 tablet in 7 days 09/21/23   Alm Delon SAILOR, DO  FLUoxetine  (PROZAC ) 20 MG capsule Take 20 mg by mouth daily. 12/19/21   [provider]  glipiZIDE  (GLUCOTROL ) 10 MG tablet Take 10 mg by mouth daily before breakfast. 04/25/22   [provider]  glucose blood test strip 1 each by Other route in the morning and at bedtime. Use as instructed checking once per day, or if new symptoms. 01/16/23   Wendolyn Jenkins Jansky, MD  hydrOXYzine  (ATARAX /VISTARIL ) 50 MG tablet Take 50 mg by mouth at bedtime as needed (sleep/anxiety.). 12/16/20   [provider]  insulin  glargine (LANTUS  SOLOSTAR) 100 UNIT/ML Solostar Pen Inject 14 Units into the skin daily. 08/11/23   Trixie File, MD  Insulin  Lispro-aabc (LYUMJEV  KWIKPEN) 100 UNIT/ML KwikPen Inject 7 Units into the skin 3 (three) times daily before meals. 08/11/23   Trixie File, MD  Insulin  Pen Needle 32G X 4 MM MISC Use 2x a  day 02/08/23   Trixie File, MD  ketoconazole  (NIZORAL ) 2 % shampoo APPLY TO SCALP AND FACE 2 TO 3 TIMES A WEEK. 04/16/23   Alm Delon SAILOR, DO  lipase/protease/amylase (CREON ) 36000 UNITS CPEP capsule Take 2 capsules (72,000 Units total) by mouth 3 (three) times daily before meals. 05/18/23   Aron Shoulders, MD  megestrol  (MEGACE ) 40 MG/ML suspension Take 10 mLs by mouth daily.    [provider]  nystatin -triamcinolone  ointment (MYCOLOG) Apply 1 Application topically 2 (two) times daily. 02/21/24   Alm Delon SAILOR, DO  pantoprazole  (PROTONIX ) 40 MG tablet Take 40 mg by mouth daily. 11/07/23   [provider]  polyethylene glycol (MIRALAX  /  GLYCOLAX ) 17 g packet Take 17 g by mouth daily as needed (constipation.).    [provider]  QUEtiapine (SEROQUEL) 25 MG tablet Take 25 mg by mouth at bedtime. 08/15/23   [provider]  rosuvastatin  (CRESTOR ) 20 MG tablet Take 20 mg by mouth daily. 12/11/23   [provider]    Allergies: Patient has no known allergies.    Review of Systems  All other systems reviewed and are negative.   Updated Vital Signs There were no vitals taken for this visit.  Physical Exam Vitals and nursing note reviewed.  Constitutional:      General: He is not in acute distress.    Appearance: He is well-developed.  HENT:     Head: Normocephalic.     Comments: 1 cm laceration to the posterior scalp Eyes:     Conjunctiva/sclera: Conjunctivae normal.  Cardiovascular:     Rate and Rhythm: Normal rate and regular rhythm.     Heart sounds: No murmur heard. Pulmonary:     Effort: Pulmonary effort is normal. No respiratory distress.     Breath sounds: Normal breath sounds.  Abdominal:     Palpations: Abdomen is soft.     Tenderness: There is no abdominal tenderness.  Musculoskeletal:        General: No swelling.     Cervical back: Neck supple.  Skin:    General: Skin is warm and dry.     Capillary Refill: Capillary refill takes less than 2 seconds.  Neurological:     Mental Status: He is alert.  Psychiatric:        Mood and Affect: Mood normal.     (all labs ordered are listed, but only abnormal results are displayed) Labs Reviewed  RESP PANEL BY RT-PCR (RSV, FLU A&B, COVID)  [id number]  GROUP A STREP BY PCR    EKG: None  Radiology: No results found.   .Laceration Repair  Date/Time: 03/29/2024 9:29 AM  Performed by: Laurice Maude BROCKS, MD Authorized by: Laurice Maude BROCKS, MD   Consent:    Consent obtained:  Verbal   Consent given by:  Patient   Risks, benefits, and alternatives were discussed: yes     Risks discussed:  Infection, nerve damage, poor wound  healing, poor cosmetic result, pain, tendon damage, vascular damage, retained foreign body and need for additional repair Universal protocol:    Immediately prior to procedure, a time out was called: yes     Patient identity confirmed:  Verbally with patient Laceration details:    Location:  Scalp   Scalp location:  Occipital   Length (cm):  1 Pre-procedure details:    Preparation:  Patient was prepped and draped in usual sterile fashion Exploration:    Limited defect created (wound extended): yes  Hemostasis achieved with:  Direct pressure   Imaging outcome: foreign body not noted     Wound exploration: wound explored through full range of motion     Contaminated: no   Treatment:    Area cleansed with:  Povidone-iodine    Amount of cleaning:  Standard   Irrigation solution:  Sterile saline Skin repair:    Repair method:  Staples   Number of staples:  2 Approximation:    Approximation:  Close Repair type:    Repair type:  Simple Post-procedure details:    Dressing:  Open (no dressing)   Procedure completion:  Tolerated    Medications Ordered in the ED - No data to display                                  Medical Decision Making Patient presents after slipping and falling this morning.  He did suffer a head injury.  Laceration to the posterior scalp was repaired easily.  Obtained imaging is without acute abnormality.  Patient reports some mild viral syndrome symptoms over the last 2 days.  He is influenza test is positive.  He would like to take Tamiflu .  This was prescribed.  Patient is comfortable with plan to discharge back to assisted living.  Both he and his family member understand need for close outpatient follow-up.  Strict return precautions given and understood.  Amount and/or Complexity of Data Reviewed Radiology: ordered.  Risk OTC drugs. Prescription drug management.        Final diagnoses:  Injury of head, initial encounter  Fall, initial  encounter  Influenza    ED Discharge Orders          Ordered    oseltamivir  (TAMIFLU ) 75 MG capsule  Every 12 hours        03/29/24 1308               Laurice Maude BROCKS, MD 03/29/24 1309  "

## 2024-03-29 NOTE — ED Notes (Signed)
 Patient transported to X-ray

## 2024-03-29 NOTE — ED Notes (Signed)
 PTAR scheduled for the patient to return to [facility]. RN updated.

## 2024-03-29 NOTE — ED Notes (Signed)
 Attempted report to [facility] x2

## 2024-03-29 NOTE — Discharge Instructions (Addendum)
 Return for any problem.  ?

## 2024-03-29 NOTE — ED Triage Notes (Signed)
 PT BIB [facility] from assisted living d/t walking w/o walker had a mechanical fall hit his head lac on back head , Skin tear left elbow. Pt reports throat pain/cough 2/3 weeks.   No blood thinners   BP 124/72 94 pulse RR 16 Cbg 146 94% ra

## 2024-04-01 ENCOUNTER — Telehealth: Payer: Self-pay

## 2024-04-01 NOTE — Telephone Encounter (Signed)
 Transition Care Management Unsuccessful Follow-up Telephone Call  Date of discharge and from where:  03/29/24 [facility]  Attempts:  1st Attempt  Reason for unsuccessful TCM follow-up call:  Left voice message; LVM for patient to complete TOC call and schedule for ED follow up with PCP. Advised to call our office to schedule this appointment. If pt returns call please schedule pt accordingly.

## 2024-04-16 ENCOUNTER — Encounter: Payer: Self-pay | Admitting: Family Medicine

## 2024-04-16 ENCOUNTER — Telehealth: Admitting: Family Medicine

## 2024-04-16 DIAGNOSIS — G609 Hereditary and idiopathic neuropathy, unspecified: Secondary | ICD-10-CM

## 2024-04-16 DIAGNOSIS — Z7409 Other reduced mobility: Secondary | ICD-10-CM

## 2024-04-16 DIAGNOSIS — E1159 Type 2 diabetes mellitus with other circulatory complications: Secondary | ICD-10-CM

## 2024-04-16 DIAGNOSIS — Z789 Other specified health status: Secondary | ICD-10-CM

## 2024-04-16 DIAGNOSIS — E1165 Type 2 diabetes mellitus with hyperglycemia: Secondary | ICD-10-CM

## 2024-04-16 DIAGNOSIS — S0101XD Laceration without foreign body of scalp, subsequent encounter: Secondary | ICD-10-CM

## 2024-04-16 NOTE — Progress Notes (Signed)
 " MyChart Video Visit Virtual Visit via Video Note   This visit type was conducted w/patient consent. This format is felt to be most appropriate for this patient at this time. Physical exam was limited by quality of the video and audio technology used for the visit. CMA was able to get the patient set up on a video visit.  Patient location: Home/ECF. Patient and provider in visit Provider location: Office  I discussed the limitations of evaluation and management by telemedicine and the availability of in person appointments. The patient expressed understanding and agreed to proceed.  Visit Date: 04/16/2024  Today's healthcare provider: Jenkins CHRISTELLA Carrel, MD     Subjective:    Patient ID: Andrew Tanner, male    DOB: 1950-11-09, 74 y.o.   MRN: [id number]  Chief Complaint  Patient presents with   Follow-up    Follow up from hospital    Discussed the use of AI scribe software for clinical note transcription with the patient, who gave verbal consent to proceed.  History of Present Illness Andrew Tanner is a 74 year old male who presents with concerns about prolonged stay in a rest home despite completing therapy requirements.  He has been residing at [facility] rest home for approximately seven to eight weeks. He believes he has completed all the requirements for physical, speech, and occupational therapy but remains at the facility. He reports that his wife has been ill with a couple of viruses and feels she has not been proactive about facilitating his discharge, which he finds confusing.  He experienced a fall on December 19th when he was weakened by the flu, resulting in two staples in his head. He is currently seeking to have these staples removed, and there is a publishing rights manager at the facility who can assist with this.  He experiences significant neuropathy pain in his feet, described as a constant stinging sensation. He is awaiting treatment from Dr. Margene for this  condition, which adds to his distress about his current living situation.  He manages his diabetes with the assistance of the facility staff, who administer Lyumjev  and pancreas medication. He states he is capable of managing his insulin  independently, as he did prior to hospitalization.  He expresses emotional distress due to his prolonged stay away from home, having not slept in his own bed for almost a year. He engages in art as a coping mechanism, having completed nearly 200 pieces, which he finds therapeutic.    Past Medical History:  Diagnosis Date   Anxiety    Atrial myxoma    Basal cell carcinoma    Cancer (HCC)    basal cell skin CA   Cataract    Depression    Diabetes mellitus without complication (HCC)    Diabetic neuropathy (HCC)    Duodenal adenoma    Hyperlipidemia    Hypertension    IC (interstitial cystitis)    Neuromuscular disorder (HCC) 01/09/21   Peripheral  Neuropathy   Pancreatic lesion    Squamous cell carcinoma of skin     Past Surgical History:  Procedure Laterality Date   BIOPSY  12/05/2022   Procedure: BIOPSY;  Surgeon: Wilhelmenia Aloha Raddle., MD;  Location: [facility];  Service: Gastroenterology;;   CATARACT EXTRACTION, BILATERAL     ESOPHAGOGASTRODUODENOSCOPY N/A 12/05/2022   Procedure: ESOPHAGOGASTRODUODENOSCOPY (EGD);  Surgeon: Wilhelmenia Aloha Raddle., MD;  Location: [facility];  Service: Gastroenterology;  Laterality: N/A;   EUS N/A 12/05/2022   Procedure: UPPER ENDOSCOPIC ULTRASOUND (EUS) RADIAL;  Surgeon: Wilhelmenia Aloha Raddle., MD;  Location: [facility];  Service: Gastroenterology;  Laterality: N/A;   FINE NEEDLE ASPIRATION N/A 12/05/2022   Procedure: FINE NEEDLE ASPIRATION (FNA) LINEAR;  Surgeon: Wilhelmenia Aloha Raddle., MD;  Location: [facility];  Service: Gastroenterology;  Laterality: N/A;   HERNIA REPAIR N/A    inguinal   NECK SURGERY     ant/post   SPINE SURGERY     SUBMUCOSAL TATTOO INJECTION  12/05/2022   Procedure:  SUBMUCOSAL TATTOO INJECTION;  Surgeon: Wilhelmenia Aloha Raddle., MD;  Location: [facility];  Service: Gastroenterology;;   TOTAL HIP ARTHROPLASTY Left 07/13/2023   Procedure: ARTHROPLASTY, HIP, TOTAL, ANTERIOR APPROACH;  Surgeon: Kendal Franky SQUIBB, MD;  Location: [facility];  Service: Orthopedics;  Laterality: Left;  [employer] bipolar Hana table  hemi hip   TRANSURETHRAL RESECTION OF PROSTATE     TURP VAPORIZATION     WHIPPLE PROCEDURE N/A 04/27/2023   Procedure: WHIPPLE PROCEDURE;  Surgeon: Aron Shoulders, MD;  Location: [facility];  Service: General;  Laterality: N/A;    Outpatient Medications Prior to Visit  Medication Sig Dispense Refill   acetaminophen  (TYLENOL ) 500 MG tablet Take 2 tablets (1,000 mg total) by mouth every 6 (six) hours as needed for mild pain (pain score 1-3). 100 tablet 0   alfuzosin  (UROXATRAL ) 10 MG 24 hr tablet Take 1 tablet by mouth once daily with breakfast 90 tablet 0   amitriptyline (ELAVIL) 10 MG tablet Take 10 mg by mouth at bedtime.     aspirin  EC 81 MG tablet Take 1 tablet (81 mg total) by mouth daily. Swallow whole.     clonazePAM  (KLONOPIN ) 0.5 MG tablet Take 1.5 mg by mouth at bedtime.     Continuous Glucose Sensor (DEXCOM G7 SENSOR) MISC Use to monitor glucose continuously, change every 10 days 6 each 3   empagliflozin  (JARDIANCE ) 10 MG TABS tablet Take 1 tablet (10 mg total) by mouth daily before breakfast. 90 tablet 3   EQ SENNA-S 8.6-50 MG tablet Take 1 tablet by mouth daily as needed for mild constipation or moderate constipation.     ferrous sulfate  (FEROSUL) 325 (65 FE) MG tablet Take 325 mg by mouth 2 (two) times daily.     FLUoxetine  (PROZAC ) 20 MG capsule Take 20 mg by mouth daily.     Glucerna (GLUCERNA) LIQD Take 1 Can by mouth daily as needed (nutrition).     glucose blood test strip 1 each by Other route in the morning and at bedtime. Use as instructed checking once per day, or if new symptoms. 100 each 1   guaiFENesin  (MUCINEX ) 600 MG 12 hr tablet  Take 600 mg by mouth 2 (two) times daily.     hydrOXYzine  (ATARAX /VISTARIL ) 50 MG tablet Take 50 mg by mouth at bedtime as needed (sleep/anxiety.).     insulin  glargine (LANTUS  SOLOSTAR) 100 UNIT/ML Solostar Pen Inject 14 Units into the skin daily. 30 mL 3   Insulin  Lispro-aabc (LYUMJEV  KWIKPEN) 100 UNIT/ML KwikPen Inject 7 Units into the skin 3 (three) times daily before meals. (Patient taking differently: Inject 9 Units into the skin 3 (three) times daily before meals.) 30 mL 3   Insulin  Pen Needle 32G X 4 MM MISC Use 2x a day 200 each 3   ketoconazole  (NIZORAL ) 2 % shampoo APPLY TO SCALP AND FACE 2 TO 3 TIMES A WEEK. 120 mL 0   lipase/protease/amylase (CREON ) 36000 UNITS CPEP capsule Take 2 capsules (72,000 Units total) by mouth 3 (three) times daily before  meals. 180 capsule 5   loperamide (IMODIUM A-D) 2 MG tablet Take 2 mg by mouth 3 (three) times daily as needed for diarrhea or loose stools.     megestrol  (MEGACE ) 40 MG/ML suspension Take 400 mg by mouth daily.     nystatin -triamcinolone  ointment (MYCOLOG) Apply 1 Application topically 2 (two) times daily. 30 g 6   pantoprazole  (PROTONIX ) 40 MG tablet Take 40 mg by mouth daily.     polyethylene glycol (MIRALAX  / GLYCOLAX ) 17 g packet Take 17 g by mouth daily as needed (constipation.).     QUEtiapine (SEROQUEL) 25 MG tablet Take 25 mg by mouth at bedtime.     rosuvastatin  (CRESTOR ) 20 MG tablet Take 20 mg by mouth daily.     Vibegron (GEMTESA) 75 MG TABS Take 75 mg by mouth daily.     oseltamivir  (TAMIFLU ) 75 MG capsule Take 1 capsule (75 mg total) by mouth every 12 (twelve) hours. 10 capsule 0   No facility-administered medications prior to visit.    Allergies[1]      Objective:     Physical Exam  Vitals and nursing note reviewed.  Constitutional:      General:  is not in acute distress.    Appearance: Normal appearance.  HENT:     Head: Normocephalic.  Pulmonary:     Effort: No respiratory distress.  Skin:    General:  Skin is dry.     Coloration: Skin is not pale.  Neurological:     Mental Status: Pt is alert and oriented to person, place, and time.  Psychiatric:        Mood and Affect: Mood normal.   There were no vitals taken for this visit.  Wt Readings from Last 3 Encounters:  03/29/24 151 lb 10.8 oz (68.8 kg)  02/23/24 151 lb 9.6 oz (68.8 kg)  02/05/24 156 lb (70.8 kg)       Assessment & Plan:   Problem List Items Addressed This Visit       Cardiovascular and Mediastinum   Poorly controlled type 2 diabetes mellitus with circulatory disorder (HCC) - Primary     Nervous and Auditory   Peripheral neuropathy   Other Visit Diagnoses       Impaired mobility and activities of daily living         Laceration of scalp, subsequent encounter           Assessment and Plan Assessment & Plan Scalp laceration with staples, needs removal   The scalp laceration has staples in place for almost three weeks, requiring removal to prevent complications. He will inquire from NP at facility  Type 2 diabetes mellitus complicated by diabetic neuropathy  now IDDM d/t whipple Type 2 diabetes mellitus is complicated by diabetic neuropathy, causing significant foot pain. He manages his diabetes with insulin , pancreatic enzymes, and assistance from facility staff. Neuropathy treatment is scheduled with Dr. Margene. Continue the current diabetes management regimen and attend the appointment with Dr. Margene.  Reduced mobility requiring assisted care   Reduced mobility necessitates assisted care. Concerns about his wife's fear of falls are impacting discharge plans, causing emotional distress due to the prolonged stay in the facility and lack of progress in discharge planning. He engages in art as a coping mechanism. A meeting with all involved parties will be organized to discuss the discharge plan, and counseling for his wife will be considered to address her fear of falls.      No orders of the  defined types were placed in this encounter.   I discussed the assessment and treatment plan with the patient. The patient was provided an opportunity to ask questions and all were answered. The patient agreed with the plan and demonstrated an understanding of the instructions.   The patient was advised to call back or seek an in-person evaluation if the symptoms worsen or if the condition fails to improve as anticipated.  No follow-ups on file.  Jenkins CHRISTELLA Carrel, MD [facility] [phone] (phone) [phone] (fax)  [facility]      [1] No Known Allergies  "

## 2024-04-16 NOTE — Patient Instructions (Signed)
 It was very nice to see you today!  Get staples removed   PLEASE NOTE:  If you had any lab tests please let us  know if you have not heard back within a few days. You may see your results on MyChart before we have a chance to review them but we will give you a call once they are reviewed by us . If we ordered any referrals today, please let us  know if you have not heard from their office within the next week.   Please try these tips to maintain a healthy lifestyle:  Eat most of your calories during the day when you are active. Eliminate processed foods including packaged sweets (pies, cakes, cookies), reduce intake of potatoes, white bread, white pasta, and white rice. Look for whole grain options, oat flour or almond flour.  Each meal should contain half fruits/vegetables, one quarter protein, and one quarter carbs (no bigger than a computer mouse).  Cut down on sweet beverages. This includes juice, soda, and sweet tea. Also watch fruit intake, though this is a healthier sweet option, it still contains natural sugar! Limit to 3 servings daily.  Drink at least 1 glass of water  with each meal and aim for at least 8 glasses per day  Exercise at least 150 minutes every week.

## 2024-04-17 ENCOUNTER — Ambulatory Visit: Admitting: Cardiology

## 2024-04-17 ENCOUNTER — Telehealth: Payer: Self-pay

## 2024-04-17 ENCOUNTER — Encounter: Payer: Self-pay | Admitting: Cardiology

## 2024-04-17 VITALS — BP 100/62 | HR 98 | Ht 71.0 in | Wt 150.0 lb

## 2024-04-17 DIAGNOSIS — D151 Benign neoplasm of heart: Secondary | ICD-10-CM | POA: Diagnosis not present

## 2024-04-17 NOTE — Patient Instructions (Addendum)
" °  Lab Work: CBC BMP  If you have labs (blood work) drawn today and your tests are completely normal, you will receive your results only by: MyChart Message (if you have MyChart) OR A paper copy in the mail If you have any lab test that is abnormal or we need to change your treatment, we will call you to review the results.  Testing/Procedures: TEE       Dear Alm Ram  You are scheduled for a TEE (Transesophageal Echocardiogram) on Tuesday, January 13 with Dr. Raford.  Please arrive at the [facility] (Main Entrance A) at [facility]: [address] [place], [place] [address] at 9:00 AM (This time is 1 hour(s) before your procedure to ensure your preparation).   Free valet parking service is available. You will check in at ADMITTING.   *Please Note: You will receive a call the day before your procedure to confirm the appointment time. That time may have changed from the original time based on the schedule for that day.*    DIET:  Nothing to eat or drink after midnight except a sip of water  with medications (see medication instructions below)  MEDICATION INSTRUCTIONS: !!IF ANY NEW MEDICATIONS ARE STARTED AFTER TODAY, PLEASE NOTIFY YOUR PROVIDER AS SOON AS POSSIBLE!!  FYI: Medications such as Semaglutide (Ozempic, Wegovy), Tirzepatide (Mounjaro, Zepbound), Dulaglutide (Trulicity), etc (GLP1 agonists) AND Canagliflozin  (Invokana ), Dapagliflozin  (Farxiga), Empagliflozin  (Jardiance ), Ertugliflozin (Steglatro), Bexagliflozin Arboriculturist) or any combination with one of these drugs such as Invokamet  (Canagliflozin /Metformin ), Synjardy (Empagliflozin /Metformin ), etc (SGLT2 inhibitors) must be held around the time of a procedure. This is not a comprehensive list of all of these drugs. Please review all of your medications and talk to your provider if you take any one of these. If you are not sure, ask your provider.         :1}HOLD: Empagliflozin  (Jardiance ) for 3 day prior to  the procedure. Last dose on Saturday, January 10.   HOLD use long acting insulin , Lantus , morning of procedure.   HOLD Glucerna morning of procedure.   LABS: today  FYI:  For your safety, and to allow us  to monitor your vital signs accurately during the surgery/procedure we request: If you have artificial nails, gel coating, SNS etc, please have those removed prior to your surgery/procedure. Not having the nail coverings /polish removed may result in cancellation or delay of your surgery/procedure.  Your support person will be asked to wait in the waiting room during your procedure.  It is OK to have someone drop you off and come back when you are ready to be discharged.  You cannot drive after the procedure and will need someone to drive you home.  Bring your insurance cards.  *Special Note: Every effort is made to have your procedure done on time. Occasionally there are emergencies that occur at the hospital that may cause delays. Please be patient if a delay does occur.      Follow-Up: At [facility], you and your health needs are our priority.  As part of our continuing mission to provide you with exceptional heart care, our providers are all part of one team.  This team includes your primary Cardiologist (physician) and Advanced Practice Providers or APPs (Physician Assistants and Nurse Practitioners) who all work together to provide you with the care you need, when you need it.  Your next appointment:   6 month(s)  Provider:   Newman JINNY Lawrence, MD        "

## 2024-04-17 NOTE — Telephone Encounter (Signed)
 Andrew Tanner has questions about Andrew Tanner approval for [employer].   She wants to when it was approved. Will it still be 100%..... She has been advised to call the insurance company for information. However she would like to speak the person who did the PA.  Thank

## 2024-04-17 NOTE — Progress Notes (Signed)
 " Cardiology Office Note:  .   Date:  04/17/2024  ID:  Andrew Tanner, DOB [birth year], MRN [id number] PCP: Wendolyn Jenkins Jansky, MD  [facility] Providers Cardiologist:  Newman Lawrence, MD PCP: Wendolyn Jenkins Jansky, MD  Chief Complaint  Patient presents with   Atrial myxoma     Andrew Tanner is a 74 y.o. 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  History of Present Illness I last saw the patient in 01/2024. 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  TEE was recommended at that time, but he had to undergo Whipple surgery followed by hip surgery at stalled further cardiac workup.  During his visit with me in 01/2024, he was not interested in further workup as he was still recovering from back-to-back surgeries, and also had some concern about short-term memory.  He is significantly improving-terms of physical mobility since then.  He still is at assisted living facility with his wife.  He has been seen by neurologist Dr. Darryll for memory issues.  Subsequently, he had a CT head that showed sellar mass which was also previously noted, but no acute intracranial abnormality.      Vitals:   04/17/24 1020  BP: 100/62  Pulse: 98  SpO2: 97%      Review of Systems  Cardiovascular:  Negative for chest pain, dyspnea on exertion, leg swelling, palpitations and syncope.        Studies Reviewed: .        Echocardiogram 02/2023: 1. 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   2. Left ventricular ejection fraction, by estimation, is 55 to 60%. The  left ventricle has normal function. The left ventricle has no regional  wall motion abnormalities. There is mild left  ventricular hypertrophy of  the basal-septal segment. Left ventricular diastolic parameters are consistent with Grade I diastolic dysfunction (impaired relaxation).   3. Right ventricular systolic function is normal. The right ventricular  size is normal.   4. The mitral valve is normal in structure. Trivial mitral valve  regurgitation. No evidence of mitral stenosis.   5. The aortic valve is tricuspid. Aortic valve regurgitation is not  visualized. No aortic stenosis is present.   6. The inferior vena cava is normal in size with greater than 50%  respiratory variability, suggesting right atrial pressure of 3 mmHg.    Independently interpreted 12/2023: HbA1C 6.3% Hb 13.7 Cr 0.82, AST/ALT 69/103 TSH 1.2   Labs 4-01/2023: Chol 126, TG 112, HDL 38, LDL 66 Cr 0.96, Gl 303     Physical Exam Vitals and nursing note reviewed.  Constitutional:      General: He is not in acute distress. Neck:     Vascular: No JVD.  Cardiovascular:     Rate and Rhythm: Normal rate and regular rhythm.     Heart sounds: Normal heart sounds. No murmur heard. Pulmonary:     Effort: Pulmonary effort is normal.     Breath sounds: Normal breath sounds. No wheezing or rales.  Musculoskeletal:     Right lower leg: No edema.     Left lower leg: No edema.      VISIT DIAGNOSES:   ICD-10-CM   1. 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 No current symptoms.  Patient has recovered. Problems before surgery and hip surgery in 2025.  Still ambulates with a cane, lives in an assisted living facility.   He is open to further workup  for atrial myxoma.  Will proceed with TEE and referred to CVTS for consideration for myxoma resection.  When he has had pancreatic or duodenectomy, there is not seem to be any obvious contraindication for him to undergo TEE with reaching out to esophagus and stomach area.   Patient knows someone who had a robotic assisted myxoma resection and asked me if he could undergo the same.  I will defer this for his discussion with the cardiothoracic surgeons.    Sellar and suprasellar mass: Stable.  Defer to PCP.  I spent 25 minutes in the care of Andrew Tanner today including reviewing findings of atrial myxoma on transthoracic echocardiogram in 02/2023, discussing risk of embolization, correlating care including TEE and CVTS referral, and documenting in the encounter.    F/u in 6 months  Signed, Newman JINNY Lawrence, MD  "

## 2024-04-17 NOTE — Telephone Encounter (Signed)
 Can you give Andrew Tanner a call back? She has some concerns about Andrew Tanner getting the Qutenza patch, the day before his Echogram.    Call back phone [phone].

## 2024-04-18 LAB — BASIC METABOLIC PANEL WITH GFR
BUN/Creatinine Ratio: 19 (ref 10–24)
BUN: 21 mg/dL (ref 8–27)
CO2: 19 mmol/L — ABNORMAL LOW (ref 20–29)
Calcium: 10 mg/dL (ref 8.6–10.2)
Chloride: 105 mmol/L (ref 96–106)
Creatinine, Ser: 1.1 mg/dL (ref 0.76–1.27)
Glucose: 206 mg/dL — ABNORMAL HIGH (ref 70–99)
Potassium: 4.3 mmol/L (ref 3.5–5.2)
Sodium: 138 mmol/L (ref 134–144)
eGFR: 70 mL/min/1.73

## 2024-04-18 LAB — CBC
Hematocrit: 40.9 % (ref 37.5–51.0)
Hemoglobin: 13.4 g/dL (ref 13.0–17.7)
MCH: 31.5 pg (ref 26.6–33.0)
MCHC: 32.8 g/dL (ref 31.5–35.7)
MCV: 96 fL (ref 79–97)
Platelets: 261 x10E3/uL (ref 150–450)
RBC: 4.26 x10E6/uL (ref 4.14–5.80)
RDW: 13.4 % (ref 11.6–15.4)
WBC: 8.2 x10E3/uL (ref 3.4–10.8)

## 2024-04-19 NOTE — Telephone Encounter (Signed)
 Task completed

## 2024-04-22 ENCOUNTER — Encounter: Attending: Physical Medicine & Rehabilitation | Admitting: Physical Medicine & Rehabilitation

## 2024-04-22 ENCOUNTER — Encounter: Payer: Self-pay | Admitting: Physical Medicine & Rehabilitation

## 2024-04-22 VITALS — BP 129/87 | HR 105 | Ht 71.0 in | Wt 149.2 lb

## 2024-04-22 DIAGNOSIS — E1142 Type 2 diabetes mellitus with diabetic polyneuropathy: Secondary | ICD-10-CM | POA: Diagnosis not present

## 2024-04-22 DIAGNOSIS — Z794 Long term (current) use of insulin: Secondary | ICD-10-CM

## 2024-04-22 MED ORDER — CAPSAICIN-CLEANSING GEL 8 % EX KIT
4.0000 | PACK | Freq: Once | CUTANEOUS | Status: AC
  Administered 2024-04-22: 4 via TOPICAL

## 2024-04-22 NOTE — Progress Notes (Signed)
 "  Subjective:    Patient ID: Andrew Tanner, male    DOB: [birth year], 74 y.o.   MRN: [id number]  HPI  Discussed the use of AI scribe software for clinical note transcription with the patient, who gave verbal consent to proceed.    CC: Diabetic neuropathy. Referred by Dr. Silva.  Andrew Tanner is a 74 y.o. year old male  who  has a past medical history of Anxiety, Atrial myxoma, Basal cell carcinoma, Cancer (HCC), Cataract, Depression, Diabetes mellitus without complication (HCC), Diabetic neuropathy (HCC), Duodenal adenoma, Hyperlipidemia, Hypertension, IC (interstitial cystitis), Neuromuscular disorder (HCC) (01/09/21), Pancreatic lesion, and Squamous cell carcinoma of skin.   They are presenting to PM&R clinic as a new patient for pain management evaluation. They were referred by Dr. Silva for treatment of polyneuropathy pain.  Red flag symptoms: No red flags for back pain endorsed in Hx or ROS     History of Present Illness: Reports onset of symptoms approximately 2.5 years ago, starting with loss of sensation in the right big toe. Symptoms have progressively worsened. Describes a pattern of pain starting in the morning, often initiated by contact with a cold floor. The sensation begins as coldness in the feet, which then transitions to pain. The right foot is more affected than the left. Pain starts on the bottoms of the feet and moves to the tops, with associated sharp pains in the toes. Describes the pre-pain sensation as a tingling or buzzing feeling. Symptoms do not typically interfere with sleep at night, though he may feel it when getting into bed before it subsides. Reports occasional stinging pain in the right knee, possibly related to physical therapy exercises. No symptoms reported in the hands.  Reports significant life events including a Whipple procedure and a hip surgery within the last year, with a 50+ pound weight loss. Reports some cognitive challenges  post-surgery which have improved with effort. Currently uses a walker for stability due to balance issues. Is moving to an assisted living facility next week to better manage mobility and care needs.  Medication Review: - Amitriptyline: Currently taking a low dose. Reports it helps with sleep and relaxation but provides no positive effect on neuropathic pain. - Prozac  (fluoxetine ): Currently taking. - Gabapentin : Previously trialed up to 500mg  daily. Discontinued due to side effects, including orthostatic lightheadedness and a syncopal episode. Reported feeling drunk and too loose. - Lyrica  (pregabalin ): Not previously trialed. - Klonopin  (clonazepam ): 1.5mg  at night for sleep. - Topical agents: Has tried frankincense and myrrh brand cream, a magnesium  spray - OTC analgesics: Aspirin  and Tylenol  are not effective. Avoiding NSAIDs post-surgery.  Past Medical History: - Diabetes mellitus type 2, transitioned from oral agents to insulin . Uses a Dexcom system. Reports difficulty with diet adherence due to limited food options at his current residence. - Peripheral neuropathy. Confirmed by nerve conduction study performed by neurology (Dr. Lomax and Dr. Onita) approximately one year ago. 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 - History of right hip fracture after a fall, treated with hip replacement. - Cervical laminoplasty (C3-C6) with titanium hardware, approximately 12 years ago. - Essential tremor, pre-dating neck surgery.  Other Treatments: - Physical Therapy: Currently engaged in PT to improve balance and strength. - TENS machine: Reports some benefit. - Spinal Cord Stimulator (SCS): Underwent a successful HFX trial with Dr. Vintram which improved walking confidence. Was close to receiving a permanent implant, but this was delayed due to subsequent major surgeries (Whipple, hip). Was later dropped  from the SCS program's active patient list due to time  lapse.  EMG 02/10/22 (from Dr. Margaret neurology note 11/13/23) This is an abnormal study.  There is electrodiagnostic evidence of length-dependent sensorimotor neuropathy, with mixed axonal and demyelinating features.  This can be seen in patient with long history of diabetes.  In addition there is evidence of chronic right C5-6-7 cervical radiculopathy; moderate right carpal tunnel syndrome.   Medications tried: Topical medications - menthol/mg cream- minimal benefit  Nsaids - Ibuprofen  doesn't help  Tylenol  - Doesn't help Opiates - N/a Gabapentin  - developed side effects at 500mg  daily, made his feel strange/passed out when getting up? Lyrica - denies TCAs  amitriptyline helps  SNRIs  on SSRI    Other treatments: PT- currently working with PT TENs unit- helps his pain Surgery  Hip surgery after fracture, Whipple procedure, C spine surgery about 12- years ago    Prior UDS results: No results found for: LABOPIA, COCAINSCRNUR, LABBENZ, AMPHETMU, THCU, LABBARB    Interval History 02/05/24 Follow-up discussion regarding Qutenza  (capsaicin  8%) patch for diabetic neuropathy. The discussion focused on patient questions and concerns about the treatment. The patient has tried over-the-counter capsaicin  (2.8%) with some mild, transient pain but no significant relief. Concerns were raised about the potential for increased pain with the higher prescription concentration (8%).  Symptom pattern is consistent: starts in the morning with stinging in the right big toe, then spreads to the other toes and the plantar surface of the right foot. This is followed by a cold sensation, then intermittent burning and tingling. The right foot is more symptomatic than the left. He is able to sleep well at night and is not bothered by the pain then. Elevating the feet provides some relief.  Current management includes a TENS machine and magnesium  oil, which provide some relief. Amitriptyline at a very  small dose is reported to be helping with mood but not significantly with pain. He has had no side effects from the amitriptyline. There is a desire to avoid changes to oral medications, specifically noting prior side effects with gabapentin  and concerns about Lyrica .  Interval History 04/22/24 Pt here for Qutenza  treatment. Continues to have bath in both feet, worse around R great toe. The procedure was explained, including potential side effects.  Pain Inventory Average Pain 5 Pain Right Now 3 My pain is intermittent, sharp, burning, dull, stabbing, tingling, and aching  In the last 24 hours, has pain interfered with the following? General activity 2 Relation with others 2 Enjoyment of life 7 What TIME of day is your pain at its worst? morning , daytime, and evening Sleep (in general) Good  Pain is worse with: sitting Pain improves with: TENS and magnesium  cream Relief from Meds: 0     Family History  Problem Relation Age of Onset   Lung cancer Mother    Heart disease Father    Hyperlipidemia Father    Diabetes Father        type 1   Diabetes Brother    Depression Brother    Renal cancer Brother    Anxiety disorder Brother    Stomach cancer Neg Hx    Colon cancer Neg Hx    Pancreatic cancer Neg Hx    Esophageal cancer Neg Hx    Rectal cancer Neg Hx    Social History   Socioeconomic History   Marital status: Married    Spouse name: [name] Speed   Number of children: 1   Years of  education: Not on file   Highest education level: Bachelor's degree (e.g., BA, AB, BS)  Occupational History   Not on file  Tobacco Use   Smoking status: Former    Current packs/day: 0.00    Average packs/day: 1 pack/day for 5.0 years (5.0 ttl pk-yrs)    Types: Cigarettes    Start date: 03/02/1977    Quit date: 03/02/1982    Years since quitting: 42.1   Smokeless tobacco: Never   Tobacco comments:    quit 28 yrs ago  Vaping Use   Vaping status: Never Used  Substance and Sexual  Activity   Alcohol use: Not Currently    Comment: none   Drug use: No   Sexual activity: Not Currently    Birth control/protection: Abstinence  Other Topics Concern   Not on file  Social History Narrative   Retired risk analyst, 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   Social Drivers of Health   Tobacco Use: Medium Risk (04/22/2024)   Patient History    Smoking Tobacco Use: Former    Smokeless Tobacco Use: Never    Passive Exposure: Not on Actuary Strain: Low Risk (01/08/2024)   Overall Financial Resource Strain (CARDIA)    Difficulty of Paying Living Expenses: Not hard at all  Food Insecurity: No Food Insecurity (01/08/2024)   Epic    Worried About Radiation Protection Practitioner of Food in the Last Year: Never true    Ran Out of Food in the Last Year: Never true  Transportation Needs: No Transportation Needs (01/08/2024)   Epic    Lack of Transportation (Medical): No    Lack of Transportation (Non-Medical): No  Physical Activity: Sufficiently Active (01/08/2024)   Exercise Vital Sign    Days of Exercise per Week: 5 days    Minutes of Exercise per Session: 50 min  Stress: No Stress Concern Present (01/08/2024)   [employer] of Occupational Health - Occupational Stress Questionnaire    Feeling of Stress: Not at all  Social Connections: Socially Integrated (01/08/2024)   Social Connection and Isolation Panel    Frequency of Communication with Friends and Family: More than three times a week    Frequency of Social Gatherings with Friends and Family: Three times a week    Attends Religious Services: More than 4 times per year    Active Member of Clubs or Organizations: Yes    Attends Banker Meetings: 1 to 4 times per year    Marital Status: Married  Depression (PHQ2-9): Low Risk (04/22/2024)   Depression (PHQ2-9)    PHQ-2 Score: 0  Alcohol Screen: Low Risk (01/08/2024)   Alcohol Screen    Last Alcohol Screening Score (AUDIT): 0  Housing: Unknown (01/08/2024)    Epic    Unable to Pay for Housing in the Last Year: No    Number of Times Moved in the Last Year: Not on file    Homeless in the Last Year: No  Utilities: Not At Risk (01/08/2024)   Epic    Threatened with loss of utilities: No  Health Literacy: Adequate Health Literacy (01/08/2024)   B1300 Health Literacy    Frequency of need for help with medical instructions: Never   Past Surgical History:  Procedure Laterality Date   BIOPSY  12/05/2022   Procedure: BIOPSY;  Surgeon: Wilhelmenia Aloha Raddle., MD;  Location: [facility];  Service: Gastroenterology;;   CATARACT EXTRACTION, BILATERAL     ESOPHAGOGASTRODUODENOSCOPY N/A 12/05/2022   Procedure: ESOPHAGOGASTRODUODENOSCOPY (EGD);  Surgeon: Wilhelmenia Aloha Raddle., MD;  Location: [facility];  Service: Gastroenterology;  Laterality: N/A;   EUS N/A 12/05/2022   Procedure: UPPER ENDOSCOPIC ULTRASOUND (EUS) RADIAL;  Surgeon: Wilhelmenia Aloha Raddle., MD;  Location: [facility];  Service: Gastroenterology;  Laterality: N/A;   FINE NEEDLE ASPIRATION N/A 12/05/2022   Procedure: FINE NEEDLE ASPIRATION (FNA) LINEAR;  Surgeon: Wilhelmenia Aloha Raddle., MD;  Location: [facility];  Service: Gastroenterology;  Laterality: N/A;   HERNIA REPAIR N/A    inguinal   NECK SURGERY     ant/post   SPINE SURGERY     SUBMUCOSAL TATTOO INJECTION  12/05/2022   Procedure: SUBMUCOSAL TATTOO INJECTION;  Surgeon: Wilhelmenia Aloha Raddle., MD;  Location: [facility];  Service: Gastroenterology;;   TOTAL HIP ARTHROPLASTY Left 07/13/2023   Procedure: ARTHROPLASTY, HIP, TOTAL, ANTERIOR APPROACH;  Surgeon: Kendal Franky SQUIBB, MD;  Location: [facility];  Service: Orthopedics;  Laterality: Left;  [employer] bipolar Hana table  hemi hip   TRANSURETHRAL RESECTION OF PROSTATE     TURP VAPORIZATION     WHIPPLE PROCEDURE N/A 04/27/2023   Procedure: WHIPPLE PROCEDURE;  Surgeon: Aron Shoulders, MD;  Location: [facility];  Service: General;  Laterality: N/A;   Past Medical History:  Diagnosis  Date   Anxiety    Atrial myxoma    Basal cell carcinoma    Cancer (HCC)    basal cell skin CA   Cataract    Depression    Diabetes mellitus without complication (HCC)    Diabetic neuropathy (HCC)    Duodenal adenoma    Hyperlipidemia    Hypertension    IC (interstitial cystitis)    Neuromuscular disorder (HCC) 01/09/21   Peripheral  Neuropathy   Pancreatic lesion    Squamous cell carcinoma of skin    Ht 5' 11 (1.803 m)   BMI 20.92 kg/m   Opioid Risk Score:   Fall Risk Score:  `1  Depression screen [facility] 2/9     04/22/2024    9:42 AM 01/15/2024   11:31 AM 01/08/2024    1:23 PM 12/27/2023    4:31 PM 03/20/2023    9:56 AM 01/16/2023   10:34 AM 01/03/2023    1:53 PM  Depression screen PHQ 2/9  Decreased Interest 0 0 0 0 0 0 0  Down, Depressed, Hopeless 0 0 0 2 1 2  0  PHQ - 2 Score 0 0 0 2 1 2  0  Altered sleeping  0 0 2 0 0 0  Tired, decreased energy  0 0 1 0 1 0  Change in appetite     0 0 0  Feeling bad or failure about yourself   0 0 0 0 0 0  Trouble concentrating  1 0 0 0 0 0  Moving slowly or fidgety/restless  1 0 0 0 0 0  Suicidal thoughts  0 0 0  0 0  PHQ-9 Score  2  0  5  1  3   0   Difficult doing work/chores  Not difficult at all Not difficult at all Somewhat difficult Not difficult at all Not difficult at all Not difficult at all     Data saved with a previous flowsheet row definition     Review of Systems  Musculoskeletal:  Positive for gait problem.  Neurological:  Positive for tremors, weakness and numbness.       Tingling  Psychiatric/Behavioral:  Positive for dysphoric mood. The patient is nervous/anxious.   All other systems reviewed and are negative.  Objective:   Physical Exam   Gen: no distress, normal appearing HEENT: oral mucosa pink and moist, NCAT Chest: normal effort, normal rate of breathing Abd: soft, non-distended Ext: no edema Psych: pleasant, normal affect Skin: intact, no breakdown noted Neuro: Alert and awake, follows  commands, cranial nerves II through XII grossly intact, normal speech and language Moving all 4 extremities to gravity  Sensation: Sensation to light touch is altered in b/l feet    Prior exam - Gait: Ambulates with a walker for stability. Gait appears cautious. - Strength: - Reflexes: Patellar reflexes are 1+ and symmetric. Achilles reflexes are absent bilaterally. - Palpation: No tenderness to palpation of knees or along the spine. Surgical scar visible on the  neck well-healed.     Assessment & Plan:    ASSESSMENT 1.  Diabetic polyneuropathy:Symptomatic, affecting feet bilaterally (R>L), characterized by painful cold sensations, tingling, and sharp pains. Current priority is improving balance and strength, making him hesitant to trial medications with potential cognitive or balance-related side effects. 2.  History of multiple complex surgeries: Including Whipple procedure and hip replacement, contributing to deconditioning and balance impairment.  PLAN -4 patchs of Qutenza  was applied to the area of pain on both feet. Ice packs were applied during the procedure to ensure patient comfort.  The patient tolerated the procedure well. Post-procedure instructions were given and follow-up has been scheduled.     2.  Pharmacotherapy:     - Lyrica  and Cymbalta  can be considered at later time if needed.      - Amitriptyline: He reports its helping mood but not pain     -No changes to current oral medications at this time. Will continue amitriptyline, magnesium  oil, and TENS unit as needed for symptomatic relief.  3.  Continue current management:     - Continue use of TENS machine as needed.     - Continue management of diabetes with his primary team.   "

## 2024-04-22 NOTE — Progress Notes (Signed)
 Pt called for pre procedure instructions.  Left message on ID [email or web address], encouraged to call back for questions. Arrival time 0900 NPO after midnight explained Instructed to take am meds with sip of water  and confirmed blood thinner consistency Instructed pt need for ride home tomorrow and have responsible adult with them for 24 hrs post procedure.

## 2024-04-23 ENCOUNTER — Ambulatory Visit (HOSPITAL_COMMUNITY): Admitting: Anesthesiology

## 2024-04-23 ENCOUNTER — Other Ambulatory Visit: Payer: Self-pay

## 2024-04-23 ENCOUNTER — Ambulatory Visit (HOSPITAL_COMMUNITY)
Admission: RE | Admit: 2024-04-23 | Discharge: 2024-04-23 | Disposition: A | Discharge status: Home / Self Care | Attending: Cardiovascular Disease | Admitting: Cardiovascular Disease

## 2024-04-23 ENCOUNTER — Encounter (HOSPITAL_COMMUNITY): Payer: Self-pay | Admitting: Cardiovascular Disease

## 2024-04-23 ENCOUNTER — Ambulatory Visit (HOSPITAL_COMMUNITY)
Admission: RE | Admit: 2024-04-23 | Discharge: 2024-04-23 | Disposition: A | Source: Ambulatory Visit | Attending: Cardiovascular Disease | Admitting: Cardiovascular Disease

## 2024-04-23 ENCOUNTER — Encounter (HOSPITAL_COMMUNITY)
Admission: RE | Disposition: A | Discharge status: Home / Self Care | Payer: Self-pay | Source: Home / Self Care | Attending: Cardiovascular Disease

## 2024-04-23 DIAGNOSIS — D151 Benign neoplasm of heart: Secondary | ICD-10-CM

## 2024-04-23 DIAGNOSIS — Z87891 Personal history of nicotine dependence: Secondary | ICD-10-CM | POA: Diagnosis not present

## 2024-04-23 DIAGNOSIS — Q2112 Patent foramen ovale: Secondary | ICD-10-CM

## 2024-04-23 DIAGNOSIS — I10 Essential (primary) hypertension: Secondary | ICD-10-CM

## 2024-04-23 HISTORY — PX: TRANSESOPHAGEAL ECHOCARDIOGRAM (CATH LAB): EP1270

## 2024-04-23 LAB — ECHO TEE

## 2024-04-23 MED ORDER — LIDOCAINE 2% (20 MG/ML) 5 ML SYRINGE
INTRAMUSCULAR | Status: DC | PRN
  Administered 2024-04-23: 100 mg via INTRAVENOUS

## 2024-04-23 MED ORDER — PROPOFOL 500 MG/50ML IV EMUL
INTRAVENOUS | Status: DC | PRN
  Administered 2024-04-23: 150 ug/kg/min via INTRAVENOUS

## 2024-04-23 MED ORDER — SODIUM CHLORIDE 0.9 % IV SOLN: INTRAVENOUS | Status: DC

## 2024-04-23 MED ORDER — SODIUM CHLORIDE 0.9 % IV SOLN: INTRAVENOUS | Status: DC | PRN

## 2024-04-23 NOTE — Transfer of Care (Signed)
 Immediate Anesthesia Transfer of Care Note  Patient: Andrew Tanner  Procedure(s) Performed: TRANSESOPHAGEAL ECHOCARDIOGRAM  Patient Location: Cath Lab  Anesthesia Type:MAC  Level of Consciousness: awake, alert , and oriented  Airway & Oxygen Therapy: Patient Spontanous Breathing and Patient connected to nasal cannula oxygen  Post-op Assessment: Report given to RN and Post -op Vital signs reviewed and stable  Post vital signs: Reviewed and stable  Last Vitals:  Vitals Value Taken Time  BP 115/75 04/23/24 10:20  Temp 36 1020  Pulse 79 04/23/24 10:20  Resp 13 04/23/24 10:20  SpO2 99 % 04/23/24 10:20    Last Pain:  Vitals:   04/23/24 0925  TempSrc: Tympanic  PainSc: 0-No pain         Complications: No notable events documented.

## 2024-04-23 NOTE — CV Procedure (Signed)
 Brief TEE Note  LVEF 60-65% Large mass attached to the LA interatrial septum.  Stalk attachment to the LA septum.  Appearance is not typical for a myxoma given that there are multiple blob-like, mobile extensions.  No obstruction to mitral valve flow. +PFO with left to right shunt. No LA/LAA thrombus.  For additional details see full report.   Kate Larock C. Raford, MD, [facility] 04/23/2024 10:22 AM

## 2024-04-23 NOTE — Anesthesia Postprocedure Evaluation (Signed)
"   Anesthesia Post Note  Patient: Andrew Tanner  Procedure(s) Performed: TRANSESOPHAGEAL ECHOCARDIOGRAM     Patient location during evaluation: PACU Anesthesia Type: MAC Level of consciousness: awake and alert Pain management: pain level controlled Vital Signs Assessment: post-procedure vital signs reviewed and stable Respiratory status: spontaneous breathing, nonlabored ventilation, respiratory function stable and patient connected to nasal cannula oxygen Cardiovascular status: stable and blood pressure returned to baseline Postop Assessment: no apparent nausea or vomiting Anesthetic complications: no   There were no known notable events for this encounter.  Last Vitals:  Vitals:   04/23/24 1047 04/23/24 1050  BP: 125/77 125/77  Pulse:  73  Resp:  17  Temp:    SpO2:  97%    Last Pain:  Vitals:   04/23/24 0925  TempSrc: Tympanic  PainSc: 0-No pain                 [name]      "

## 2024-04-23 NOTE — Progress Notes (Signed)
" °  Echocardiogram Echocardiogram Transesophageal has been performed.  Koleen KANDICE Popper, RDCS 04/23/2024, 10:31 AM "

## 2024-04-23 NOTE — Anesthesia Preprocedure Evaluation (Addendum)
"                                    Anesthesia Evaluation  Patient identified by MRN, date of birth, ID band Patient awake    Reviewed: Allergy & Precautions, NPO status , Patient's Chart, lab work & pertinent test results  Airway Mallampati: III       Dental no notable dental hx.    Pulmonary former smoker   Pulmonary exam normal        Cardiovascular hypertension, + Peripheral Vascular Disease  Normal cardiovascular exam  Echo:   1. 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   2. Left ventricular ejection fraction, by estimation, is 55 to 60%. The  left ventricle has normal function. The left ventricle has no regional  wall motion abnormalities. There is mild left ventricular hypertrophy of  the basal-septal segment. Left  ventricular diastolic parameters are consistent with Grade I diastolic  dysfunction (impaired relaxation).   3. Right ventricular systolic function is normal. The right ventricular  size is normal.   4. The mitral valve is normal in structure. Trivial mitral valve  regurgitation. No evidence of mitral stenosis.   5. The aortic valve is tricuspid. Aortic valve regurgitation is not  visualized. No aortic stenosis is present.   6. The inferior vena cava is normal in size with greater than 50%  respiratory variability, suggesting right atrial pressure of 3 mmHg.      Neuro/Psych  PSYCHIATRIC DISORDERS Anxiety Depression     Neuromuscular disease    GI/Hepatic Neg liver ROS,GERD  Medicated,,  Endo/Other  diabetes, Type 2, Oral Hypoglycemic Agents, Insulin  Dependent    Renal/GU      Musculoskeletal negative musculoskeletal ROS (+)    Abdominal   Peds  Hematology negative hematology ROS (+)   Anesthesia Other Findings   Reproductive/Obstetrics                              Anesthesia Physical Anesthesia Plan  ASA: 2  Anesthesia Plan: MAC   Post-op Pain  Management: Minimal or no pain anticipated   Induction: Intravenous  PONV Risk Score and Plan: 0 and Propofol  infusion  Airway Management Planned: Natural Airway and Nasal Cannula  Additional Equipment: None  Intra-op Plan:   Post-operative Plan:   Informed Consent: I have reviewed the patients History and Physical, chart, labs and discussed the procedure including the risks, benefits and alternatives for the proposed anesthesia with the patient or authorized representative who has indicated his/her understanding and acceptance.       Plan Discussed with: CRNA  Anesthesia Plan Comments:          Anesthesia Quick Evaluation  "

## 2024-04-24 ENCOUNTER — Telehealth: Payer: Self-pay | Admitting: Cardiovascular Disease

## 2024-04-24 NOTE — Telephone Encounter (Signed)
 Pt requesting his TEE results be mailed to his address

## 2024-04-24 NOTE — Telephone Encounter (Signed)
 Report mailed to patient.

## 2024-05-02 ENCOUNTER — Ambulatory Visit

## 2024-05-02 VITALS — BP 105/72 | HR 96 | Resp 20 | Ht 71.0 in | Wt 150.0 lb

## 2024-05-02 DIAGNOSIS — D151 Benign neoplasm of heart: Secondary | ICD-10-CM | POA: Diagnosis not present

## 2024-05-02 NOTE — Progress Notes (Signed)
 "   [address], [place] [address]             [phone]    Fabyan Loughmiller [facility] Medical Record #[id number] Date of Birth: [birth year]  Referring: Andrew Newman PARAS, MD Primary Care: Andrew Jenkins Jansky, MD Primary Cardiologist:Andrew Tanner Elmira, MD  Chief Complaint:    Chief Complaint  Patient presents with   Atrial Myxoma    Surgical consult/ TEE 04/23/24    History of Present Illness:     Andrew Tanner is a 74 y.o. 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  The left atrial mass was first identified in Nov 2024.  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  His post-op course was complicated by a fall requiring left hip arthroplasty.  Since then, he has been living in an assisted living facility ([facility] ), working on getting stronger, improving his mobility and working towards getting back home where his wife lives.  They live in a two-story home with the bedroom and bathroom upstairs, 14 steps, and this remains a challenge for patient to transition from skilled nursing facility back home.  He has a long-standing history of DM and his glucose has been difficult to control despite being on insulin .  He has neuropathy in both legs and ambulates with a cane all the time to help with stability.  Before the whipple he weight 200 lbs, he now weighs 148-153 lbs but it has been pretty stable.  He reports having difficulty gaining weight between the diabetes and post-whipple anatomy.  He says his A1c was 6.7% in fall of 2025 but suspects it might be higher now as his Dexcom usually reads 160-220s.  He denies chest pain, shortness of breath, leg swelling, syncope or history of stroke.  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  Overall patient has had significant difficulty returning to his baseline.   His wife reports some short-term memory difficulty but he seems to be able to recount his medical history pretty well for me.    LHC (TBD): N/A TEE (04/23/24):  Normal BiV function.  ASD/PFO with L to R shunt.  Left atrial mass is large, mobile and echodense, attached to the septum.  It is highly-mobile with multiple frond-like projections - measures 3.2 x 1.1 cm.  The mitral valve is unaffected. MRI (02/20/23):  Left atrial mass attached to the septum, appears to perfuse heterogenously.  Findings most consistent with a myxoma - 1.9 x 1.4 cm Chest CT (05/13/23): No significant aortic calcifications  Past Medical and Surgical History: Previous Chest Surgery: No Previous Chest Radiation: no Diabetes Mellitus: Yes.  HbA1C 6.4% (10/23/23) Anticoagulation: No, Last dose N/A  Creatinine:  Lab Results  Component Value Date   CREATININE 1.10 04/17/2024   CREATININE 0.82 12/27/2023   CREATININE 0.89 07/14/2023     Past Medical History:  Diagnosis Date   Anxiety    Atrial myxoma    Basal cell carcinoma    Cancer (HCC)    basal cell skin CA   Cataract    Depression    Diabetes mellitus without complication (HCC)    Diabetic neuropathy (HCC)    Duodenal adenoma    Hyperlipidemia    Hypertension    IC (interstitial cystitis)    Neuromuscular disorder (HCC) 01/09/21   Peripheral  Neuropathy   Pancreatic lesion    Squamous cell carcinoma of skin     Past Surgical History:  Procedure Laterality Date   BIOPSY  12/05/2022   Procedure: BIOPSY;  Surgeon: Andrew Aloha Raddle., MD;  Location: [facility];  Service: Gastroenterology;;   CATARACT EXTRACTION, BILATERAL     ESOPHAGOGASTRODUODENOSCOPY N/A 12/05/2022   Procedure: ESOPHAGOGASTRODUODENOSCOPY (EGD);  Surgeon: Andrew Aloha Raddle., MD;  Location: [facility];  Service: Gastroenterology;  Laterality: N/A;   EUS N/A 12/05/2022   Procedure: UPPER ENDOSCOPIC ULTRASOUND (EUS) RADIAL;  Surgeon: Andrew Aloha Raddle., MD;  Location: [facility];  Service: Gastroenterology;  Laterality: N/A;   FINE NEEDLE ASPIRATION N/A 12/05/2022   Procedure: FINE NEEDLE ASPIRATION (FNA) LINEAR;  Surgeon: Andrew Aloha Raddle., MD;  Location: [facility];  Service: Gastroenterology;  Laterality: N/A;   HERNIA REPAIR N/A    inguinal   NECK SURGERY     ant/post   SPINE SURGERY     SUBMUCOSAL TATTOO INJECTION  12/05/2022   Procedure: SUBMUCOSAL TATTOO INJECTION;  Surgeon: Andrew Aloha Raddle., MD;  Location: [facility];  Service: Gastroenterology;;   TOTAL HIP ARTHROPLASTY Left 07/13/2023   Procedure: ARTHROPLASTY, HIP, TOTAL, ANTERIOR APPROACH;  Surgeon: Andrew Franky SQUIBB, MD;  Location: [facility];  Service: Orthopedics;  Laterality: Left;  [employer] bipolar Hana table  hemi hip   TRANSESOPHAGEAL ECHOCARDIOGRAM (CATH LAB) N/A 04/23/2024   Procedure: TRANSESOPHAGEAL ECHOCARDIOGRAM;  Surgeon: Andrew Riggs, MD;  Location: [facility] INVASIVE CV LAB;  Service: Cardiovascular;  Laterality: N/A;   TRANSURETHRAL RESECTION OF PROSTATE     TURP VAPORIZATION     WHIPPLE PROCEDURE N/A 04/27/2023   Procedure: WHIPPLE PROCEDURE;  Surgeon: Andrew Shoulders, MD;  Location: [facility] OR;  Service: General;  Laterality: N/A;    Social History:  Tobacco Use History[1]  Social History   Substance and Sexual Activity  Alcohol Use Not Currently   Comment: none     Allergies[2]   Current Outpatient Medications  Medication Sig Dispense Refill   acetaminophen  (TYLENOL ) 500 MG tablet Take 2 tablets (1,000 mg total) by mouth every 6 (six) hours as needed for mild pain (pain score 1-3). 100 tablet 0   alfuzosin  (UROXATRAL ) 10 MG 24 hr tablet Take 1 tablet by mouth once daily with breakfast 90 tablet 0   amitriptyline (ELAVIL) 10 MG tablet Take 10 mg by mouth at bedtime.     aspirin  EC 81 MG tablet Take 1 tablet (81 mg total) by mouth daily. Swallow whole.     clonazePAM  (KLONOPIN ) 0.5 MG tablet Take 1.5 mg by mouth at bedtime.     Continuous Glucose Sensor  (DEXCOM G7 SENSOR) MISC Use to monitor glucose continuously, change every 10 days 6 each 3   empagliflozin  (JARDIANCE ) 10 MG TABS tablet Take 1 tablet (10 mg total) by mouth daily before breakfast. 90 tablet 3   EQ SENNA-S 8.6-50 MG tablet Take 1 tablet by mouth daily as needed for mild constipation or moderate constipation.     ferrous sulfate  (FEROSUL) 325 (65 FE) MG tablet Take 325 mg by mouth 2 (two) times daily.     FLUoxetine  (PROZAC ) 20 MG capsule Take 20 mg by mouth daily.     Glucerna (GLUCERNA) LIQD Take 1 Can by mouth daily as needed (nutrition).     glucose blood test strip 1 each by Other route in the morning and at bedtime. Use as instructed checking once per day, or if new symptoms. 100 each 1   guaiFENesin  (MUCINEX ) 600  MG 12 hr tablet Take 600 mg by mouth 2 (two) times daily.     hydrOXYzine  (ATARAX /VISTARIL ) 50 MG tablet Take 50 mg by mouth at bedtime as needed (sleep/anxiety.).     insulin  glargine (LANTUS  SOLOSTAR) 100 UNIT/ML Solostar Pen Inject 14 Units into the skin daily. 30 mL 3   Insulin  Lispro-aabc (LYUMJEV  KWIKPEN) 100 UNIT/ML KwikPen Inject 7 Units into the skin 3 (three) times daily before meals. (Patient taking differently: Inject 9 Units into the skin 3 (three) times daily before meals.) 30 mL 3   Insulin  Pen Needle 32G X 4 MM MISC Use 2x a day 200 each 3   ketoconazole  (NIZORAL ) 2 % shampoo APPLY TO SCALP AND FACE 2 TO 3 TIMES A WEEK. 120 mL 0   lipase/protease/amylase (CREON ) 36000 UNITS CPEP capsule Take 2 capsules (72,000 Units total) by mouth 3 (three) times daily before meals. 180 capsule 5   loperamide (IMODIUM A-D) 2 MG tablet Take 2 mg by mouth 3 (three) times daily as needed for diarrhea or loose stools.     megestrol  (MEGACE ) 40 MG/ML suspension Take 400 mg by mouth daily.     nystatin -triamcinolone  ointment (MYCOLOG) Apply 1 Application topically 2 (two) times daily. 30 g 6   pantoprazole  (PROTONIX ) 40 MG tablet Take 40 mg by mouth daily.     polyethylene  glycol (MIRALAX  / GLYCOLAX ) 17 g packet Take 17 g by mouth daily as needed (constipation.).     QUEtiapine (SEROQUEL) 25 MG tablet Take 25 mg by mouth at bedtime.     rosuvastatin  (CRESTOR ) 20 MG tablet Take 20 mg by mouth daily.     Vibegron (GEMTESA) 75 MG TABS Take 75 mg by mouth daily.     No current facility-administered medications for this visit.    (Not in a hospital admission)   Family History  Problem Relation Age of Onset   Lung cancer Mother    Heart disease Father    Hyperlipidemia Father    Diabetes Father        type 1   Diabetes Brother    Depression Brother    Renal cancer Brother    Anxiety disorder Brother    Stomach cancer Neg Hx    Colon cancer Neg Hx    Pancreatic cancer Neg Hx    Esophageal cancer Neg Hx    Rectal cancer Neg Hx      Review of Systems:   Review of Systems  Constitutional:  Positive for malaise/fatigue and weight loss.  Respiratory:  Negative for cough and shortness of breath.   Cardiovascular:  Negative for chest pain, palpitations and leg swelling.  Gastrointestinal:  Negative for nausea and vomiting.  Neurological:  Negative for dizziness and headaches.      Physical Exam: There were no vitals taken for this visit. Physical Exam Constitutional:      General: He is not in acute distress.    Appearance: He is ill-appearing.  HENT:     Head: Normocephalic and atraumatic.  Cardiovascular:     Rate and Rhythm: Normal rate and regular rhythm.  Pulmonary:     Effort: Pulmonary effort is normal.     Breath sounds: Normal breath sounds.  Abdominal:     General: There is no distension.     Palpations: Abdomen is soft.     Tenderness: There is no abdominal tenderness.  Musculoskeletal:        General: No swelling.  Skin:    General: Skin is warm  and dry.  Neurological:     Comments: Got up and walked well, slow gate but steady.      Diagnostic Studies & Laboratory data: Cardiac Studies & Procedures    ______________________________________________________________________________________________     ECHOCARDIOGRAM  ECHOCARDIOGRAM COMPLETE 02/10/2023  Narrative ECHOCARDIOGRAM REPORT    Patient Name:   Andrew Tanner Date of Exam: 02/10/2023 Medical Rec #:  [id number]         Height:       71.0 in Accession #:    [id number]        Weight:       177.4 lb Date of Birth:  Apr 20, 1950         BSA:          2.004 m Patient Age:    72 years          BP:           126/62 mmHg Patient Gender: M                 HR:           67 bpm. Exam Location:  [address]  Procedure: 2D Echo, Cardiac Doppler and Color Doppler  Indications:    Preop clearance  History:        Patient has no prior history of Echocardiogram examinations. No cardiac history.  Sonographer:    Lauraine Pilot RDCS Referring Phys: [phone] PHILIP J NAHSER   Sonographer Comments: Consulted with DOD,Dr. Fernande IMPRESSIONS   1. Mass noted in left atrium that appears to be attached to the septum; probable myxoma. 2. Left ventricular ejection fraction, by estimation, is 55 to 60%. The left ventricle has normal function. The left ventricle has no regional wall motion abnormalities. There is mild left ventricular hypertrophy of the basal-septal segment. Left ventricular diastolic parameters are consistent with Grade I diastolic dysfunction (impaired relaxation). 3. Right ventricular systolic function is normal. The right ventricular size is normal. 4. The mitral valve is normal in structure. Trivial mitral valve regurgitation. No evidence of mitral stenosis. 5. The aortic valve is tricuspid. Aortic valve regurgitation is not visualized. No aortic stenosis is present. 6. The inferior vena cava is normal in size with greater than 50% respiratory variability, suggesting right atrial pressure of 3 mmHg.  Comparison(s): No prior Echocardiogram.  FINDINGS Left Ventricle: Left ventricular ejection fraction, by estimation, is 55 to  60%. The left ventricle has normal function. The left ventricle has no regional wall motion abnormalities. The left ventricular internal cavity size was normal in size. There is mild left ventricular hypertrophy of the basal-septal segment. Left ventricular diastolic parameters are consistent with Grade I diastolic dysfunction (impaired relaxation).  Right Ventricle: The right ventricular size is normal. Right ventricular systolic function is normal.  Left Atrium: Left atrial size was normal in size.  Right Atrium: Right atrial size was normal in size.  Pericardium: There is no evidence of pericardial effusion.  Mitral Valve: The mitral valve is normal in structure. Trivial mitral valve regurgitation. No evidence of mitral valve stenosis.  Tricuspid Valve: The tricuspid valve is normal in structure. Tricuspid valve regurgitation is trivial. No evidence of tricuspid stenosis.  Aortic Valve: The aortic valve is tricuspid. Aortic valve regurgitation is not visualized. No aortic stenosis is present.  Pulmonic Valve: The pulmonic valve was normal in structure. Pulmonic valve regurgitation is not visualized. No evidence of pulmonic stenosis.  Aorta: The aortic root is normal in size and structure.  Venous: The inferior vena  cava is normal in size with greater than 50% respiratory variability, suggesting right atrial pressure of 3 mmHg.  IAS/Shunts: No atrial level shunt detected by color flow Doppler.  Additional Comments: Mass noted in left atrium that appears to be attached to the septum; probable myxoma.   LEFT VENTRICLE PLAX 2D LVIDd:         4.20 cm     Diastology LVIDs:         2.90 cm     LV e' medial:    7.40 cm/s LV PW:         1.00 cm     LV E/e' medial:  7.6 LV IVS:        1.00 cm     LV e' lateral:   9.90 cm/s LVOT diam:     2.30 cm     LV E/e' lateral: 5.7 LV SV:         71 LV SV Index:   35 LVOT Area:     4.15 cm  LV Volumes (MOD) LV vol d, MOD A2C: 94.5 ml LV vol d,  MOD A4C: 93.8 ml LV vol s, MOD A2C: 43.8 ml LV vol s, MOD A4C: 48.6 ml LV SV MOD A2C:     50.7 ml LV SV MOD A4C:     93.8 ml LV SV MOD BP:      50.3 ml  RIGHT VENTRICLE RV S prime:     14.60 cm/s TAPSE (M-mode): 2.1 cm  LEFT ATRIUM             Index        RIGHT ATRIUM           Index LA diam:        4.20 cm 2.10 cm/m   RA Area:     15.10 cm LA Vol (A2C):   39.3 ml 19.61 ml/m  RA Volume:   34.40 ml  17.17 ml/m LA Vol (A4C):   38.7 ml 19.31 ml/m LA Biplane Vol: 40.1 ml 20.01 ml/m AORTIC VALVE LVOT Vmax:   101.00 cm/s LVOT Vmean:  56.600 cm/s LVOT VTI:    0.170 m  AORTA Ao Root diam: 3.60 cm Ao Asc diam:  3.60 cm  MITRAL VALVE MV Area (PHT): 2.95 cm    SHUNTS MV Decel Time: 257 msec    Systemic VTI:  0.17 m MV E velocity: 56.20 cm/s  Systemic Diam: 2.30 cm MV A velocity: 82.70 cm/s MV E/A ratio:  0.68  Redell Shallow MD Electronically signed by Redell Shallow MD Signature Date/Time: 02/10/2023/1:58:46 PM    Final   TEE  ECHO TEE 04/23/2024  Narrative TRANSESOPHOGEAL ECHO REPORT    Patient Name:   Andrew Tanner Date of Exam: 04/23/2024 Medical Rec #:  [id number]         Height:       71.0 in Accession #:    [id number]        Weight:       150.0 lb Date of Birth:  May 19, 1950         BSA:          1.866 m Patient Age:    73 years          BP:           129/83 mmHg Patient Gender: M                 HR:           78  bpm. Exam Location:  Inpatient  Procedure: Transesophageal Echo, Cardiac Doppler, Color Doppler, Saline Contrast Bubble Study and 3D Echo (Both Spectral and Color Flow Doppler were utilized during procedure).  Indications:     Atrial Myxoma D15.1  History:         Patient has prior history of Echocardiogram examinations, most recent 02/10/2023. Hx of Atrial Myxoma; Risk Factors:Hypertension, Diabetes and Dyslipidemia.  Sonographer:     Koleen Popper RDCS Referring Phys:  [id number] [facility] Diagnosing Phys: Annabella Scarce  MD  PROCEDURE: After discussion of the risks and benefits of a TEE, an informed consent was obtained from the patient. The transesophogeal probe was passed without difficulty through the esophogus of the patient. Imaged were obtained with the patient in a left lateral decubitus position. Sedation performed by different physician. The patient was monitored while under deep sedation. Anesthestetic sedation was provided intravenously by Anesthesiology: 231mg  of Propofol , 100mg  of Lidocaine . Image quality was good. The patient's vital signs; including heart rate, blood pressure, and oxygen saturation; remained stable throughout the procedure. The patient developed no complications during the procedure.  IMPRESSIONS   1. Left ventricular ejection fraction, by estimation, is 60 to 65%. The left ventricle has normal function. The left ventricle has no regional wall motion abnormalities. 2. Right ventricular systolic function is normal. The right ventricular size is normal. 3. No left atrial/left atrial appendage thrombus was detected. 4. The mitral valve is normal in structure. Trivial mitral valve regurgitation. No evidence of mitral stenosis. The mean mitral valve gradient is 1.0 mmHg with average heart rate of 77 bpm. 5. The aortic valve is normal in structure. Aortic valve regurgitation is not visualized. No aortic stenosis is present. 6. The inferior vena cava is normal in size with greater than 50% respiratory variability, suggesting right atrial pressure of 3 mmHg. 7. Evidence of atrial level shunting detected by color flow Doppler. Agitated saline contrast bubble study was positive with shunting observed within 3-6 cardiac cycles suggestive of interatrial shunt. There is a small patent foramen ovale with predominantly left to right shunting across the atrial septum. 8. 3D performed of the atrial septum and demonstrates LA mass.  Conclusion(s)/Recommendation(s): Normal biventricular function  without evidence of hemodynamically significant valvular heart disease.  FINDINGS Left Ventricle: Left ventricular ejection fraction, by estimation, is 60 to 65%. The left ventricle has normal function. The left ventricle has no regional wall motion abnormalities. The left ventricular internal cavity size was normal in size. There is no left ventricular hypertrophy.  Right Ventricle: The right ventricular size is normal. No increase in right ventricular wall thickness. Right ventricular systolic function is normal.  Left Atrium: There is a large, mobile echodensity attached to the left atrial side of the interatrial septum. It is highly mobile with multiple blob-like projections. Appearance is atypical for myxoma, though this is still the most likely diagnosis given its location. It does not obstruct or cross the mitral valve. Measures 3.2 cm x 1.1 cm. Left atrial size was normal in size. No left atrial/left atrial appendage thrombus was detected.  Right Atrium: Right atrial size was normal in size.  Pericardium: There is no evidence of pericardial effusion.  Mitral Valve: The mitral valve is normal in structure. Trivial mitral valve regurgitation. No evidence of mitral valve stenosis. MV peak gradient, 1.8 mmHg. The mean mitral valve gradient is 1.0 mmHg with average heart rate of 77 bpm.  Tricuspid Valve: The tricuspid valve is normal in structure. Tricuspid valve regurgitation is not demonstrated.  No evidence of tricuspid stenosis.  Aortic Valve: The aortic valve is normal in structure. Aortic valve regurgitation is not visualized. No aortic stenosis is present.  Pulmonic Valve: The pulmonic valve was normal in structure. Pulmonic valve regurgitation is not visualized. No evidence of pulmonic stenosis.  Aorta: The aortic root is normal in size and structure.  Venous: The inferior vena cava is normal in size with greater than 50% respiratory variability, suggesting right atrial pressure of  3 mmHg.  IAS/Shunts: Evidence of atrial level shunting detected by color flow Doppler. Agitated saline contrast was given intravenously to evaluate for intracardiac shunting. Agitated saline contrast bubble study was positive with shunting observed within 3-6 cardiac cycles suggestive of interatrial shunt. A small patent foramen ovale is detected with predominantly left to right shunting across the atrial septum.  Additional Comments: 3D was performed not requiring image post processing on an independent workstation and was abnormal. Spectral Doppler performed.  LEFT VENTRICLE PLAX 2D LVOT diam:     2.50 cm LVOT Area:     4.91 cm    AORTA Ao Root diam: 3.60 cm Ao Asc diam:  3.30 cm  MITRAL VALVE MV Peak grad: 1.8 mmHg  SHUNTS MV Mean grad: 1.0 mmHg  Systemic Diam: 2.50 cm MV Vmax:      0.66 m/s MV Vmean:     41.0 cm/s  Annabella Scarce MD Electronically signed by Annabella Scarce MD Signature Date/Time: 04/23/2024/11:02:48 AM    Final      CARDIAC MRI  MR CARDIAC MORPHOLOGY W WO CONTRAST 02/16/2023  Narrative CLINICAL DATA:  Cardiac mass/tumor, further eval atrial myxoma  EXAM: MR CARDIA MORPHOLOGY WITHOUT AND WITH CONTRAST; MR CARDIAC VELOCITY FLOW MAPPING  TECHNIQUE: The patient was scanned on a 1.5 Tesla Siemens magnet. A dedicated cardiac coil was used. Functional imaging was done using TrueFisp sequences. 2,3, and 4 chamber views were done to assess for RWMA's. Modified Simpson's rule using a short axis stack was used to calculate an ejection fraction on a dedicated work [employer]. The patient received 8mL GADAVIST  GADOBUTROL  1 MMOL/ML IV SOLN. After 10 minutes inversion recovery sequences were used to assess for infiltration and scar tissue. Phase contrast velocity encoded images obtained x 2.  This examination is tailored for evaluation cardiac anatomy and function and provides very limited assessment of noncardiac structures, which  are accordingly not evaluated during interpretation. If there is clinical concern for extracardiac pathology, further evaluation with CT imaging should be considered.  FINDINGS: LEFT VENTRICLE:  Normal left ventricular chamber size.  Left ventricular wall thickness is mildly increased.  Normal left ventricular systolic function.  LVEF = 58%  There are no regional wall motion abnormalities.  Normal first pass perfusion.  There is no post contrast delayed myocardial enhancement.  Normal T1 myocardial nulling kinetics suggest against a diagnosis of cardiac amyloidosis.  RIGHT VENTRICLE:  Normal right ventricular chamber size.  Normal right ventricular wall thickness.  Normal right ventricular systolic function.  RVEF = 52%  There are no regional wall motion abnormalities.  No post contrast delayed myocardial enhancement.  ATRIA:  Mass in the left atrium, attached to atrial septum. Stalk not well visualized but mass is independently mobile. Does not appear to cross mitral valve. T1 isointense to myocardium. T2 hyperintense. Appears to perfuse heterogeneously. Findings most consistent with left atrial myxoma, maximum dimensions appear 19 mm x 14 mm.  VALVES:  No significant valvular abnormalities.  Tricuspid aortic valve.  PERICARDIUM:  Normal pericardium.  Small pericardial effusion.  OTHER: No significant extracardiac findings.  MEASUREMENTS: Qp/Qs: 0.91  Aortic valve regurgitation: Trivial, regurgitant fraction 3%  Pulmonary valve regurgitation: Trivial, regurgitant fraction 3%  Mitral valve regurgitation: Trivial, regurgitant fraction 1%  Tricuspid valve regurgitation: Mild, regurgitant fraction 13%  Left ventricle:  LV male  LV EF: 58% (normal 49-79%)  Absolute volumes:  LV EDV: (normal 95-215 mL)  LV ESV: 57mL (Normal 25-85 mL)  LV SV: 77mL (normal 61-145 mL)  CO: 4.6L/min (Normal 3.4-7.8 L/min)  Indexed volumes:  LV EDV:  77mL/sq-m (normal 50-108 mL/sq-m)  LV ESV: 58mL/sq-m (Normal 11-47 mL/sq-m)  LV SV: 8mL/sq-m (Normal 33-72 mL/sq-m)  CI: 2.28L/min/sq-m (normal 1.8-4.2 L/min/sq-m)  Right ventricle:  RV male  RV EF:  52% (normal 51-80%)  Absolute volumes:  RV EDV: (Normal 109-217 mL)  RV ESV: 73mL (Normal 23-91 mL)  RV SV: 79mL (Normal 71-141 mL)  CO: 4.7L/min (Normal 2.8-8.8 L/min)  Indexed volumes:  RV EDV: 56mL/sq-m (normal 58-109 mL/sq-m)  RV ESV: 64mL/sq-m (Normal 12-46 mL/sq-m)  RV SV: 55mL/sq-m (normal 38-71 mL/sq-m)  CI: 2.34L/min/sq-m (Normal 1.7-4.2 L/min/sq-m)  IMPRESSION: 1. 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  2. Normal biventricular chamber size and function. LVEF 58%, RVEF 52%. No delayed myocardial enhancement.   Electronically Signed By: Soyla Merck M.D. On: 02/20/2023 16:25   ______________________________________________________________________________________________     EKG: NSR I have independently reviewed the above radiologic studies and discussed with the patient   Recent Lab Findings: Lab Results  Component Value Date   WBC 8.2 04/17/2024   HGB 13.4 04/17/2024   HCT 40.9 04/17/2024   PLT 261 04/17/2024   GLUCOSE 206 (H) 04/17/2024   CHOL 126 07/25/2022   TRIG 112.0 07/25/2022   HDL 38.40 (L) 07/25/2022   LDLCALC 66 07/25/2022   ALT 103 (H) 12/27/2023   AST 69 (H) 12/27/2023   NA 138 04/17/2024   K 4.3 04/17/2024   CL 105 04/17/2024   CREATININE 1.10 04/17/2024   BUN 21 04/17/2024   CO2 19 (L) 04/17/2024   TSH 1.200 11/13/2023   INR 1.1 07/13/2023   HGBA1C 6.3 (A) 10/23/2023      Assessment / Plan:   Mr. 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  His post-op course has been challenging and he remains in an assisted-living facility for rehab.  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  He has not had any strokes despite the mass being present since November 2024, but it seems to be enlarging and is very mobile.    Generally speaking, the mass should be removed, however his candidacy for surgery is unclear.  He still needs a left heart cath but he is generally pretty debilitated and appears frail since his Whipple surgery.  He was able to get up and walk pretty easily, but his walking was slow and guarded.   He inquired about a robotic approach to mass resection which I think would be a good idea given his frailty and need for a cane to ambulate.  Avoiding sternotomy would be preferred in his condition.  I have spoken to Dr. Laymon Robert at [facility] who will see Mr. Wasserboerhr in consultation for a possible minimally-invasive mass resection.  In the meantime, he will obtain a left heart cath here at [facility]   If we do not proceed with surgery, the  alternative would be watchful waiting and probably anticoagulation.  I  spent 45 minutes counseling the patient face to face.   Con RAMAN Elhadj Girton 05/02/2024 12:17 PM          [1]  Social History Tobacco Use  Smoking Status Former   Current packs/day: 0.00   Average packs/day: 1 pack/day for 5.0 years (5.0 ttl pk-yrs)   Types: Cigarettes   Start date: 03/02/1977   Quit date: 03/02/1982   Years since quitting: 42.1  Smokeless Tobacco Never  Tobacco Comments   quit 28 yrs ago  [2] No Known Allergies  "

## 2024-05-06 ENCOUNTER — Other Ambulatory Visit

## 2024-06-07 ENCOUNTER — Other Ambulatory Visit

## 2024-07-03 ENCOUNTER — Ambulatory Visit: Admitting: Internal Medicine

## 2024-07-22 ENCOUNTER — Encounter: Admitting: Physical Medicine & Rehabilitation

## 2025-01-13 ENCOUNTER — Ambulatory Visit

## 2025-02-24 ENCOUNTER — Ambulatory Visit: Admitting: Dermatology
